# Patient Record
Sex: Female | Born: 1990 | Race: Black or African American | Hispanic: No | Marital: Single | Smoking: Former smoker
Health system: Southern US, Community
[De-identification: ages and names within clinical notes are randomized; demographics above are authoritative.]

## PROBLEM LIST (undated history)

## (undated) ENCOUNTER — Inpatient Hospital Stay (HOSPITAL_COMMUNITY): Payer: Self-pay

## (undated) DIAGNOSIS — I272 Pulmonary hypertension, unspecified: Secondary | ICD-10-CM

## (undated) DIAGNOSIS — M1990 Unspecified osteoarthritis, unspecified site: Secondary | ICD-10-CM

## (undated) DIAGNOSIS — IMO0002 Reserved for concepts with insufficient information to code with codable children: Secondary | ICD-10-CM

## (undated) DIAGNOSIS — R519 Headache, unspecified: Secondary | ICD-10-CM

## (undated) DIAGNOSIS — K219 Gastro-esophageal reflux disease without esophagitis: Secondary | ICD-10-CM

## (undated) DIAGNOSIS — M329 Systemic lupus erythematosus, unspecified: Secondary | ICD-10-CM

## (undated) DIAGNOSIS — J984 Other disorders of lung: Secondary | ICD-10-CM

## (undated) DIAGNOSIS — J188 Other pneumonia, unspecified organism: Secondary | ICD-10-CM

## (undated) DIAGNOSIS — I10 Essential (primary) hypertension: Secondary | ICD-10-CM

## (undated) DIAGNOSIS — J9601 Acute respiratory failure with hypoxia: Secondary | ICD-10-CM

## (undated) DIAGNOSIS — A64 Unspecified sexually transmitted disease: Secondary | ICD-10-CM

## (undated) DIAGNOSIS — R87629 Unspecified abnormal cytological findings in specimens from vagina: Secondary | ICD-10-CM

## (undated) DIAGNOSIS — M3500 Sicca syndrome, unspecified: Secondary | ICD-10-CM

## (undated) DIAGNOSIS — R51 Headache: Secondary | ICD-10-CM

## (undated) DIAGNOSIS — J849 Interstitial pulmonary disease, unspecified: Secondary | ICD-10-CM

## (undated) DIAGNOSIS — J189 Pneumonia, unspecified organism: Secondary | ICD-10-CM

## (undated) DIAGNOSIS — F32A Depression, unspecified: Secondary | ICD-10-CM

## (undated) HISTORY — DX: Pneumonia, unspecified organism: J18.9

## (undated) HISTORY — DX: Unspecified sexually transmitted disease: A64

## (undated) HISTORY — DX: Essential (primary) hypertension: I10

## (undated) HISTORY — DX: Pulmonary hypertension, unspecified: I27.20

## (undated) HISTORY — DX: Interstitial pulmonary disease, unspecified: J84.9

## (undated) HISTORY — DX: Other pneumonia, unspecified organism: J18.8

## (undated) HISTORY — DX: Other disorders of lung: J98.4

## (undated) HISTORY — DX: Acute respiratory failure with hypoxia: J96.01

---
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
## 2014-03-17 ENCOUNTER — Encounter (HOSPITAL_COMMUNITY): Payer: Self-pay | Admitting: Emergency Medicine

## 2014-03-17 ENCOUNTER — Emergency Department (HOSPITAL_COMMUNITY)
Admission: EM | Admit: 2014-03-17 | Discharge: 2014-03-17 | Disposition: A | Discharge status: Home / Self Care | Payer: Self-pay | Attending: Emergency Medicine | Admitting: Emergency Medicine

## 2014-03-17 DIAGNOSIS — N760 Acute vaginitis: Secondary | ICD-10-CM | POA: Insufficient documentation

## 2014-03-17 DIAGNOSIS — M545 Low back pain, unspecified: Secondary | ICD-10-CM | POA: Insufficient documentation

## 2014-03-17 DIAGNOSIS — M549 Dorsalgia, unspecified: Secondary | ICD-10-CM

## 2014-03-17 DIAGNOSIS — F17200 Nicotine dependence, unspecified, uncomplicated: Secondary | ICD-10-CM | POA: Insufficient documentation

## 2014-03-17 DIAGNOSIS — G8921 Chronic pain due to trauma: Secondary | ICD-10-CM | POA: Insufficient documentation

## 2014-03-17 DIAGNOSIS — A499 Bacterial infection, unspecified: Secondary | ICD-10-CM | POA: Insufficient documentation

## 2014-03-17 DIAGNOSIS — B9689 Other specified bacterial agents as the cause of diseases classified elsewhere: Secondary | ICD-10-CM | POA: Insufficient documentation

## 2014-03-17 DIAGNOSIS — Z3202 Encounter for pregnancy test, result negative: Secondary | ICD-10-CM | POA: Insufficient documentation

## 2014-03-17 LAB — WET PREP, GENITAL
Trich, Wet Prep: NONE SEEN
Yeast Wet Prep HPF POC: NONE SEEN

## 2014-03-17 LAB — URINALYSIS, ROUTINE W REFLEX MICROSCOPIC
Bilirubin Urine: NEGATIVE
Glucose, UA: NEGATIVE mg/dL
Hgb urine dipstick: NEGATIVE
Ketones, ur: NEGATIVE mg/dL
Leukocytes, UA: NEGATIVE
Nitrite: NEGATIVE
Protein, ur: NEGATIVE mg/dL
Specific Gravity, Urine: 1.017 (ref 1.005–1.030)
Urobilinogen, UA: 0.2 mg/dL (ref 0.0–1.0)
pH: 7 (ref 5.0–8.0)

## 2014-03-17 LAB — HIV ANTIBODY (ROUTINE TESTING W REFLEX): HIV 1&2 Ab, 4th Generation: NONREACTIVE

## 2014-03-17 LAB — POC URINE PREG, ED: Preg Test, Ur: NEGATIVE

## 2014-03-17 MED ORDER — METRONIDAZOLE 500 MG PO TABS: 500.0000 mg | ORAL_TABLET | Freq: Two times a day (BID) | ORAL | Status: DC

## 2014-03-17 MED ORDER — TRAMADOL HCL 50 MG PO TABS: 50.0000 mg | ORAL_TABLET | Freq: Four times a day (QID) | ORAL | Status: DC | PRN

## 2014-03-17 MED ORDER — HYDROCODONE-ACETAMINOPHEN 5-325 MG PO TABS
2.0000 | ORAL_TABLET | Freq: Once | ORAL | Status: AC
  Administered 2014-03-17: 2 via ORAL
  Filled 2014-03-17: qty 2

## 2014-03-17 MED ORDER — ONDANSETRON 4 MG PO TBDP
8.0000 mg | ORAL_TABLET | Freq: Once | ORAL | Status: AC
  Administered 2014-03-17: 8 mg via ORAL
  Filled 2014-03-17: qty 2

## 2014-03-17 NOTE — Discharge Instructions (Signed)
Bacterial Vaginosis °Bacterial vaginosis is a vaginal infection that occurs when the normal balance of bacteria in the vagina is disrupted. It results from an overgrowth of certain bacteria. This is the most common vaginal infection in women of childbearing age. Treatment is important to prevent complications, especially in pregnant women, as it can cause a premature delivery. °CAUSES  °Bacterial vaginosis is caused by an increase in harmful bacteria that are normally present in smaller amounts in the vagina. Several different kinds of bacteria can cause bacterial vaginosis. However, the reason that the condition develops is not fully understood. °RISK FACTORS °Certain activities or behaviors can put you at an increased risk of developing bacterial vaginosis, including: °· Having a new sex partner or multiple sex partners. °· Douching. °· Using an intrauterine device (IUD) for contraception. °Women do not get bacterial vaginosis from toilet seats, bedding, swimming pools, or contact with objects around them. °SIGNS AND SYMPTOMS  °Some women with bacterial vaginosis have no signs or symptoms. Common symptoms include: °· Grey vaginal discharge. °· A fishlike odor with discharge, especially after sexual intercourse. °· Itching or burning of the vagina and vulva. °· Burning or pain with urination. °DIAGNOSIS  °Your health care provider will take a medical history and examine the vagina for signs of bacterial vaginosis. A sample of vaginal fluid may be taken. Your health care provider will look at this sample under a microscope to check for bacteria and abnormal cells. A vaginal pH test may also be done.  °TREATMENT  °Bacterial vaginosis may be treated with antibiotic medicines. These may be given in the form of a pill or a vaginal cream. A second round of antibiotics may be prescribed if the condition comes back after treatment.  °HOME CARE INSTRUCTIONS  °· Only take over-the-counter or prescription medicines as  directed by your health care provider. °· If antibiotic medicine was prescribed, take it as directed. Make sure you finish it even if you start to feel better. °· Do not have sex until treatment is completed. °· Tell all sexual partners that you have a vaginal infection. They should see their health care provider and be treated if they have problems, such as a mild rash or itching. °· Practice safe sex by using condoms and only having one sex partner. °SEEK MEDICAL CARE IF:  °· Your symptoms are not improving after 3 days of treatment. °· You have increased discharge or pain. °· You have a fever. °MAKE SURE YOU:  °· Understand these instructions. °· Will watch your condition. °· Will get help right away if you are not doing well or get worse. °FOR MORE INFORMATION  °Centers for Disease Control and Prevention, Division of STD Prevention: www.cdc.gov/std °American Sexual Health Association (ASHA): www.ashastd.org  °Document Released: 11/07/2005 Document Revised: 08/28/2013 Document Reviewed: 06/19/2013 °ExitCare® Patient Information ©2014 [employer]. ° ° °Back Pain, Adult °Low back pain is very common. About 1 in 5 people have back pain. The cause of low back pain is rarely dangerous. The pain often gets better over time. About half of people with a sudden onset of back pain feel better in just 2 weeks. About 8 in 10 people feel better by 6 weeks.  °CAUSES °Some common causes of back pain include: °· Strain of the muscles or ligaments supporting the spine. °· Wear and tear (degeneration) of the spinal discs. °· Arthritis. °· Direct injury to the back. °DIAGNOSIS °Most of the time, the direct cause of low back pain is not known. However, back pain can   be treated effectively even when the exact cause of the pain is unknown. Answering your caregiver's questions about your overall health and symptoms is one of the most accurate ways to make sure the cause of your pain is not dangerous. If your caregiver needs more  information, he or she may order lab work or imaging tests (X-rays or MRIs). However, even if imaging tests show changes in your back, this usually does not require surgery. °HOME CARE INSTRUCTIONS °For many people, back pain returns. Since low back pain is rarely dangerous, it is often a condition that people can learn to manage on their own.  °· Remain active. It is stressful on the back to sit or stand in one place. Do not sit, drive, or stand in one place for more than 30 minutes at a time. Take short walks on level surfaces as soon as pain allows. Try to increase the length of time you walk each day. °· Do not stay in bed. Resting more than 1 or 2 days can delay your recovery. °· Do not avoid exercise or work. Your body is made to move. It is not dangerous to be active, even though your back may hurt. Your back will likely heal faster if you return to being active before your pain is gone. °· Pay attention to your body when you  bend and lift. Many people have less discomfort when lifting if they bend their knees, keep the load close to their bodies, and avoid twisting. Often, the most comfortable positions are those that put less stress on your recovering back. °· Find a comfortable position to sleep. Use a firm mattress and lie on your side with your knees slightly bent. If you lie on your back, put a pillow under your knees. °· Only take over-the-counter or prescription medicines as directed by your caregiver. Over-the-counter medicines to reduce pain and inflammation are often the most helpful. Your caregiver may prescribe muscle relaxant drugs. These medicines help dull your pain so you can more quickly return to your normal activities and healthy exercise. °· Put ice on the injured area. °· Put ice in a plastic bag. °· Place a towel between your skin and the bag. °· Leave the ice on for 15-20 minutes, 03-04 times a day for the first 2 to 3 days. After that, ice and heat may be alternated to reduce pain  and spasms. °· Ask your caregiver about trying back exercises and gentle massage. This may be of some benefit. °· Avoid feeling anxious or stressed. Stress increases muscle tension and can worsen back pain. It is important to recognize when you are anxious or stressed and learn ways to manage it. Exercise is a great option. °SEEK MEDICAL CARE IF: °· You have pain that is not relieved with rest or medicine. °· You have pain that does not improve in 1 week. °· You have new symptoms. °· You are generally not feeling well. °SEEK IMMEDIATE MEDICAL CARE IF:  °· You have pain that radiates from your back into your legs. °· You develop new bowel or bladder control problems. °· You have unusual weakness or numbness in your arms or legs. °· You develop nausea or vomiting. °· You develop abdominal pain. °· You feel faint. °Document Released: 11/07/2005 Document Revised: 05/08/2012 Document Reviewed: 03/28/2011 °ExitCare® Patient Information ©2014 [employer]. ° °
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
## 2014-03-17 NOTE — ED Notes (Addendum)
Pt presents with lower back pain, clear vaginal discharge, and moderate vaginal itching x10 days. Pt denies burning with urination.

## 2014-03-18 LAB — GC/CHLAMYDIA PROBE AMP
CT Probe RNA: POSITIVE — AB
GC Probe RNA: NEGATIVE

## 2014-03-19 NOTE — ED Provider Notes (Signed)
Medical screening examination/treatment/procedure(s) were performed by non-physician practitioner and as supervising physician I was immediately available for consultation/collaboration.   EKG Interpretation None        Laray AngerKathleen M Marcele Kosta, DO 03/19/14 1627

## 2014-03-20 ENCOUNTER — Telehealth (HOSPITAL_BASED_OUTPATIENT_CLINIC_OR_DEPARTMENT_OTHER): Payer: Self-pay | Admitting: Emergency Medicine

## 2014-03-20 NOTE — Telephone Encounter (Signed)
+  Chlamydia. Chart sent to EDP office for review. [facility]HS attached. 

## 2014-03-21 HISTORY — PX: FINGER SURGERY: SHX640

## 2014-03-30 ENCOUNTER — Encounter (HOSPITAL_COMMUNITY): Payer: Self-pay | Admitting: Emergency Medicine

## 2014-03-30 ENCOUNTER — Emergency Department (HOSPITAL_COMMUNITY)
Admission: EM | Admit: 2014-03-30 | Discharge: 2014-03-30 | Disposition: A | Discharge status: Home / Self Care | Payer: Worker's Compensation | Attending: Emergency Medicine | Admitting: Emergency Medicine

## 2014-03-30 DIAGNOSIS — S61209A Unspecified open wound of unspecified finger without damage to nail, initial encounter: Secondary | ICD-10-CM | POA: Insufficient documentation

## 2014-03-30 DIAGNOSIS — W3189XA Contact with other specified machinery, initial encounter: Secondary | ICD-10-CM | POA: Insufficient documentation

## 2014-03-30 DIAGNOSIS — Y9289 Other specified places as the place of occurrence of the external cause: Secondary | ICD-10-CM | POA: Insufficient documentation

## 2014-03-30 DIAGNOSIS — Z792 Long term (current) use of antibiotics: Secondary | ICD-10-CM | POA: Insufficient documentation

## 2014-03-30 DIAGNOSIS — Y9389 Activity, other specified: Secondary | ICD-10-CM | POA: Insufficient documentation

## 2014-03-30 DIAGNOSIS — F17200 Nicotine dependence, unspecified, uncomplicated: Secondary | ICD-10-CM | POA: Insufficient documentation

## 2014-03-30 DIAGNOSIS — S61210A Laceration without foreign body of right index finger without damage to nail, initial encounter: Secondary | ICD-10-CM

## 2014-03-30 DIAGNOSIS — Y990 Civilian activity done for income or pay: Secondary | ICD-10-CM | POA: Insufficient documentation

## 2014-03-30 MED ORDER — HYDROCODONE-ACETAMINOPHEN 5-325 MG PO TABS: 1.0000 | ORAL_TABLET | Freq: Four times a day (QID) | ORAL | Status: DC | PRN

## 2014-03-30 MED ORDER — HYDROCODONE-ACETAMINOPHEN 5-325 MG PO TABS
1.0000 | ORAL_TABLET | Freq: Once | ORAL | Status: AC
  Administered 2014-03-30: 1 via ORAL
  Filled 2014-03-30: qty 1

## 2014-03-30 NOTE — ED Provider Notes (Signed)
CSN: [id number]     Arrival date & time 03/30/14  0044 History   First MD Initiated Contact with Patient 03/30/14 0047     No chief complaint on file.    (Consider location/radiation/quality/duration/timing/severity/associated sxs/prior Treatment) HPI  23 year old female presents for evaluation of finger laceration.  Pt report she was working at [employer] tonight and while cleaning out the [employer] machine, the machine turn on and a part of the machine cuts her R index finger.  Incident happened an hr ago.  Report 10/10 sharp throbbing pain to R index finger with active bleeding.  Denies numbness.  Denies any other injury.  Is UTD with tetanus.    No past medical history on file. No past surgical history on file. No family history on file. History  Substance Use Topics  . Smoking status: Current Every Day Smoker  . Smokeless tobacco: Not on file  . Alcohol Use: Yes   OB History   Grav Para Term Preterm Abortions TAB SAB Ect Mult Living                 Review of Systems  Constitutional: Negative for fever.  Skin: Positive for wound.  Neurological: Negative for numbness.      Allergies  Review of patient's allergies indicates no known allergies.  Home Medications   Prior to Admission medications   Medication Sig Start Date End Date Taking? Authorizing Provider  metroNIDAZOLE (FLAGYL) 500 MG tablet Take 1 tablet (500 mg total) by mouth 2 (two) times daily. One po bid x 7 days 03/17/14   Mora BellmanHannah S Merrell, PA-C  traMADol (ULTRAM) 50 MG tablet Take 1 tablet (50 mg total) by mouth every 6 (six) hours as needed. 03/17/14   Mora BellmanHannah S Merrell, PA-C   There were no vitals taken for this visit. Physical Exam  Nursing note and vitals reviewed. Constitutional: She appears well-developed and well-nourished. No distress.  HENT:  Head: Atraumatic.  Eyes: Conjunctivae are normal.  Neck: Neck supple.  Musculoskeletal: She exhibits tenderness (R hand: R index finger with 2cm v-shaped  superficial laceration to lateral aspect, no joint involvement.  NVI.  able to flex /extend finger at each joint.  brisk cap refill, sensation intact. no nerve/bony/tendon involvement). She exhibits no edema.  Neurological: She is alert.  Skin: No rash noted.  Psychiatric: She has a normal mood and affect.    ED Course  Procedures (including critical care time)  12:58 AM Pt injured her R index finger while at work.  Has a superficial lac to R index finger.  No joint, bony, nerve or tendon involvement.  Will cleansed wound and perform laceration repair.  Pain medication provided.  Pt is UTD with tetanus.  Finger splint provide for stability and support.  Pt made aware to return in 1 week for sutures removal.    LACERATION REPAIR Performed by: Fayrene HelperBowie Shashana Fullington Authorized by: Fayrene HelperBowie Marzell Isakson Consent: Verbal consent obtained. Risks and benefits: risks, benefits and alternatives were discussed Consent given by: patient Patient identity confirmed: provided demographic data Prepped and Draped in normal sterile fashion Wound explored  Laceration Location: R index finger  Laceration Length: 2cm  No Foreign Bodies seen or palpated  Anesthesia: digital nerve block  Local anesthetic: lidocaine 2% w/o epinephrine  Anesthetic total: 4 ml  Irrigation method: syringe Amount of cleaning: standard  Skin closure: prolene 5.0  Number of sutures: 7  Technique: simple interrupted  Patient tolerance: Patient tolerated the procedure well with no immediate complications.   Labs  Review Labs Reviewed - No data to display  Imaging Review No results found.   EKG Interpretation None      MDM   Final diagnoses:  Laceration of right index finger w/o foreign body w/o damage to nail    BP 135/85  Pulse 82  Temp(Src) 98.5 F (36.9 C) (Oral)  Resp 18  SpO2 98%  LMP 03/27/2014      Fayrene HelperBowie Starlee Corralejo, PA-C 03/30/14 0136

## 2014-03-30 NOTE — ED Provider Notes (Signed)
Medical screening examination/treatment/procedure(s) were performed by non-physician practitioner and as supervising physician I was immediately available for consultation/collaboration.   Tyrek Lawhorn, MD 03/30/14 0653 

## 2014-03-30 NOTE — ED Notes (Signed)
Patient at work this evening and got right pointer finger jammed into machine, now with open wound on middle knuckle.  Bleeding controlled.

## 2014-03-30 NOTE — Discharge Instructions (Signed)
Please have your sutures removed in 1 week.  Return if you notice signs of infection including increase swelling, redness, pus drainage.  Take pain medication as needed.    Laceration Care, Adult A laceration is a cut or lesion that goes through all layers of the skin and into the tissue just beneath the skin. TREATMENT  Some lacerations may not require closure. Some lacerations may not be able to be closed due to an increased risk of infection. It is important to see your caregiver as soon as possible after an injury to minimize the risk of infection and maximize the opportunity for successful closure. If closure is appropriate, pain medicines may be given, if needed. The wound will be cleaned to help prevent infection. Your caregiver will use stitches (sutures), staples, wound glue (adhesive), or skin adhesive strips to repair the laceration. These tools bring the skin edges together to allow for faster healing and a better cosmetic outcome. However, all wounds will heal with a scar. Once the wound has healed, scarring can be minimized by covering the wound with sunscreen during the day for 1 full year. HOME CARE INSTRUCTIONS  For sutures or staples:  Keep the wound clean and dry.  If you were given a bandage (dressing), you should change it at least once a day. Also, change the dressing if it becomes wet or dirty, or as directed by your caregiver.  Wash the wound with soap and water 2 times a day. Rinse the wound off with water to remove all soap. Pat the wound dry with a clean towel.  After cleaning, apply a thin layer of the antibiotic ointment as recommended by your caregiver. This will help prevent infection and keep the dressing from sticking.  You may shower as usual after the first 24 hours. Do not soak the wound in water until the sutures are removed.  Only take over-the-counter or prescription medicines for pain, discomfort, or fever as directed by your caregiver.  Get your sutures  or staples removed as directed by your caregiver. For skin adhesive strips:  Keep the wound clean and dry.  Do not get the skin adhesive strips wet. You may bathe carefully, using caution to keep the wound dry.  If the wound gets wet, pat it dry with a clean towel.  Skin adhesive strips will fall off on their own. You may trim the strips as the wound heals. Do not remove skin adhesive strips that are still stuck to the wound. They will fall off in time. For wound adhesive:  You may briefly wet your wound in the shower or bath. Do not soak or scrub the wound. Do not swim. Avoid periods of heavy perspiration until the skin adhesive has fallen off on its own. After showering or bathing, gently pat the wound dry with a clean towel.  Do not apply liquid medicine, cream medicine, or ointment medicine to your wound while the skin adhesive is in place. This may loosen the film before your wound is healed.  If a dressing is placed over the wound, be careful not to apply tape directly over the skin adhesive. This may cause the adhesive to be pulled off before the wound is healed.  Avoid prolonged exposure to sunlight or tanning lamps while the skin adhesive is in place. Exposure to ultraviolet light in the first year will darken the scar.  The skin adhesive will usually remain in place for 5 to 10 days, then naturally fall off the skin. Do  not pick at the adhesive film. You may need a tetanus shot if:  You cannot remember when you had your last tetanus shot.  You have never had a tetanus shot. If you get a tetanus shot, your arm may swell, get red, and feel warm to the touch. This is common and not a problem. If you need a tetanus shot and you choose not to have one, there is a rare chance of getting tetanus. Sickness from tetanus can be serious. SEEK MEDICAL CARE IF:   You have redness, swelling, or increasing pain in the wound.  You see a red line that goes away from the wound.  You have  yellowish-white fluid (pus) coming from the wound.  You have a fever.  You notice a bad smell coming from the wound or dressing.  Your wound breaks open before or after sutures have been removed.  You notice something coming out of the wound such as wood or glass.  Your wound is on your hand or foot and you cannot move a finger or toe. SEEK IMMEDIATE MEDICAL CARE IF:   Your pain is not controlled with prescribed medicine.  You have severe swelling around the wound causing pain and numbness or a change in color in your arm, hand, leg, or foot.  Your wound splits open and starts bleeding.  You have worsening numbness, weakness, or loss of function of any joint around or beyond the wound.  You develop painful lumps near the wound or on the skin anywhere on your body. MAKE SURE YOU:   Understand these instructions.  Will watch your condition.  Will get help right away if you are not doing well or get worse. Document Released: 11/07/2005 Document Revised: 01/30/2012 Document Reviewed: 05/03/2011 [facility] Patient Information 2014 [employer], [place].

## 2014-03-30 NOTE — Progress Notes (Signed)
Orthopedic Tech Progress Note Patient Details:  Lelan PonsLashonna Marques [birth year] [id number]  Ortho Devices Type of Ortho Device: Finger splint   Haskell FlirtCorey M Jonmarc Bodkin 03/30/2014, 1:38 AM

## 2014-09-18 ENCOUNTER — Encounter (HOSPITAL_COMMUNITY): Payer: Self-pay | Admitting: Emergency Medicine

## 2014-09-18 ENCOUNTER — Emergency Department (HOSPITAL_COMMUNITY)
Admission: EM | Admit: 2014-09-18 | Discharge: 2014-09-19 | Disposition: A | Discharge status: Home / Self Care | Payer: Self-pay | Attending: Emergency Medicine | Admitting: Emergency Medicine

## 2014-09-18 ENCOUNTER — Emergency Department (HOSPITAL_COMMUNITY): Payer: Self-pay

## 2014-09-18 DIAGNOSIS — R21 Rash and other nonspecific skin eruption: Secondary | ICD-10-CM

## 2014-09-18 DIAGNOSIS — L309 Dermatitis, unspecified: Secondary | ICD-10-CM | POA: Insufficient documentation

## 2014-09-18 DIAGNOSIS — R058 Other specified cough: Secondary | ICD-10-CM

## 2014-09-18 DIAGNOSIS — J189 Pneumonia, unspecified organism: Secondary | ICD-10-CM

## 2014-09-18 DIAGNOSIS — J159 Unspecified bacterial pneumonia: Secondary | ICD-10-CM | POA: Insufficient documentation

## 2014-09-18 DIAGNOSIS — B079 Viral wart, unspecified: Secondary | ICD-10-CM | POA: Insufficient documentation

## 2014-09-18 DIAGNOSIS — R05 Cough: Secondary | ICD-10-CM

## 2014-09-18 DIAGNOSIS — Z872 Personal history of diseases of the skin and subcutaneous tissue: Secondary | ICD-10-CM

## 2014-09-18 DIAGNOSIS — Z720 Tobacco use: Secondary | ICD-10-CM | POA: Insufficient documentation

## 2014-09-18 DIAGNOSIS — Z79899 Other long term (current) drug therapy: Secondary | ICD-10-CM | POA: Insufficient documentation

## 2014-09-18 MED ORDER — BENZONATATE 100 MG PO CAPS: 100.0000 mg | ORAL_CAPSULE | Freq: Three times a day (TID) | ORAL | Status: DC

## 2014-09-18 MED ORDER — AZITHROMYCIN 250 MG PO TABS: 250.0000 mg | ORAL_TABLET | Freq: Every day | ORAL | Status: DC

## 2014-09-18 NOTE — ED Provider Notes (Signed)
CSN: [id number]     Arrival date & time 09/18/14  1740 History   First MD Initiated Contact with Patient 09/18/14 2133     Chief Complaint  Patient presents with  . Rash  . URI     (Consider location/radiation/quality/duration/timing/severity/associated sxs/prior Treatment) HPI Kayla [name] is a 23 y.o. female with PMH of ezcema presenting with with 5 days of productive cough of intermittent thick colored sputum with small streaks of blood yesterday morning. Patient also with sinus congestion, sore throat, generalized aches and pains. Pt denies taking anything for the symptoms. Patient also with generalized rash that started on right chest and spread to bilateral face over cheeks and nose, left buttocks and left thigh and bilateral hands. Rash is pruritic. Patient used hydrocortisone without relief. Denies history of asthma but patient is a smoker.    History reviewed. No pertinent past medical history. History reviewed. No pertinent past surgical history. History reviewed. No pertinent family history. History  Substance Use Topics  . Smoking status: Current Every Day Smoker  . Smokeless tobacco: Not on file  . Alcohol Use: Yes   OB History   Grav Para Term Preterm Abortions TAB SAB Ect Mult Living                 Review of Systems  Constitutional: Positive for fever and chills.  HENT: Positive for congestion and rhinorrhea.   Eyes: Negative for visual disturbance.  Respiratory: Positive for cough. Negative for shortness of breath.   Cardiovascular: Negative for chest pain and palpitations.  Gastrointestinal: Negative for nausea, vomiting and diarrhea.  Genitourinary: Negative for dysuria and hematuria.  Musculoskeletal: Negative for back pain and gait problem.  Skin: Positive for rash.  Neurological: Negative for weakness and headaches.      Allergies  Review of patient's allergies indicates no known allergies.  Home Medications   Prior to Admission medications    Medication Sig Start Date End Date Taking? Authorizing Provider  gabapentin (NEURONTIN) 300 MG capsule Take 300 mg by mouth 3 (three) times daily.   Yes Historical Provider, MD  azithromycin (ZITHROMAX) 250 MG tablet Take 1 tablet (250 mg total) by mouth daily. Take first 2 tablets together, then 1 every day until finished. 09/18/14   Louann SjogrenVictoria L Avnoor Koury, PA-C  benzonatate (TESSALON) 100 MG capsule Take 1 capsule (100 mg total) by mouth every 8 (eight) hours. 09/18/14   Benetta SparVictoria L Birtie Fellman, PA-C   BP 118/72  Pulse 83  Temp(Src) 99.7 F (37.6 C) (Oral)  Resp 18  SpO2 100% Physical Exam  Nursing note and vitals reviewed. Constitutional: She appears well-developed and well-nourished. No distress.  HENT:  Head: Normocephalic and atraumatic.  Nose: Right sinus exhibits no maxillary sinus tenderness and no frontal sinus tenderness. Left sinus exhibits no maxillary sinus tenderness and no frontal sinus tenderness.  Mouth/Throat: Mucous membranes are normal. Posterior oropharyngeal edema and posterior oropharyngeal erythema present. No oropharyngeal exudate.  Eyes: Conjunctivae and EOM are normal. Right eye exhibits no discharge. Left eye exhibits no discharge.  Neck: Normal range of motion. Neck supple.  Cardiovascular: Normal rate, regular rhythm and normal heart sounds.   Pulmonary/Chest: Effort normal and breath sounds normal. No respiratory distress. She has no wheezes. She has no rales.  Abdominal: Soft. Bowel sounds are normal. She exhibits no distension. There is no tenderness.  Lymphadenopathy:    She has no cervical adenopathy.  Neurological: She is alert.  Skin: Skin is warm and dry. She is not diaphoretic.  Patient  with lesions consistent with viral warts on her bilateral hands. Pt with erythematous confluent rash over cheeks and bridge of nose. Similar rash on right upper chest. Patient with excoriations to left buttocks with mild erythema and right thigh.    ED Course  Procedures  (including critical care time) Labs Review Labs Reviewed - No data to display  Imaging Review Dg Chest 2 View  09/18/2014   CLINICAL DATA:  Productive cough, chest pain, shortness of breath.  EXAM: CHEST  2 VIEW  COMPARISON:  None.  FINDINGS: Multifocal airspace opacities, most pronounced within the bases, left greater than right. Cardiomediastinal contours within normal range. No pleural effusion or pneumothorax. No acute osseous finding.  IMPRESSION: Multifocal pneumonia.  Recommend radiograph follow-up after therapy to document resolution.   Electronically Signed   By: Jearld LeschAndrew  DelGaizo M.D.   On: 09/18/2014 23:19     EKG Interpretation None     Meds given in ED:  Medications - No data to display  New Prescriptions   AZITHROMYCIN (ZITHROMAX) 250 MG TABLET    Take 1 tablet (250 mg total) by mouth daily. Take first 2 tablets together, then 1 every day until finished.   BENZONATATE (TESSALON) 100 MG CAPSULE    Take 1 capsule (100 mg total) by mouth every 8 (eight) hours.      MDM   Final diagnoses:  Productive cough  CAP (community acquired pneumonia)  Viral warts  Rash  H/O eczema   Patient has been diagnosed with CAP via chest xray. Pt is not ill appearing, immunocompromised, and does not have multiple co morbidities, therefore I feel like the they can be treated as an OP with abx therapy. Pt has been advised to return to the ED if symptoms worsen or they do not improve. Pt without PCP and to follow up with wellness center. Pt also with history of eczema presenting with rash to face, right chest, left buttocks and left thigh with excoriations. I suspect eczema. tx with moisturizing lotions. Pt also with viral warts on bilateral hands. Pt to tx with OTC salicylic acid or cryotherapy. Pt verbalizes understanding and is agreeable with plan.   Discussed return precautions with patient. Discussed all results and patient verbalizes understanding and agrees with plan.  Case has been  discussed with Dr. Deretha EmoryZackowski who agrees with the above plan and to discharge.     Louann SjogrenVictoria L Azka Steger, PA-C 09/19/14 [id number]

## 2014-09-18 NOTE — Discharge Instructions (Signed)
Return to the emergency room with worsening of symptoms, new symptoms or with symptoms that are concerning, especially you start coughing up blood, pain is uncontrolled, severe shortness of breath or chest pain, you are getting worse instead of betterh. Follow up with the wellness clinic in one week. Call to make appointment. Number above. Please take all of your antibiotics until finished!   You may develop abdominal discomfort or diarrhea from the antibiotic.  You may help offset this with probiotics which you can buy or get in yogurt. Do not eat  or take the probiotics until 2 hours after your antibiotic.  Use lubricating/moisturizing lotions for rash. DO NO SCRATCH. Use OTC freeze away for warts on hands or salicylic acid.  Drink plenty of fluids with electrolytes especially Gatorade. OTC cold medications such as mucinex, nyquil, dayquil are recommended. Chloraseptic for sore throat.

## 2014-09-18 NOTE — ED Notes (Signed)
A little achey but no problems

## 2014-09-18 NOTE — ED Notes (Signed)
Pt in c/o cough and congestion, also generalized rash that was noted this week and has spread, generalized fatigue also, no distress noted

## 2014-09-18 NOTE — ED Notes (Signed)
Pt. C/o rash to neck last weeks, states she used cortisone cream and it went away to neck but now has it to body. Rash appears dry and scabby. Pt. Also reports generalized fatigue that started at same time as rash, denies fever but reports "heat coming off my skin".

## 2014-09-19 NOTE — ED Provider Notes (Signed)
Medical screening examination/treatment/procedure(s) were performed by non-physician practitioner and as supervising physician I was immediately available for consultation/collaboration.   EKG Interpretation None       Vanetta MuldersScott Azarie Coriz, MD 09/19/14 [phone]

## 2014-11-05 ENCOUNTER — Encounter (HOSPITAL_COMMUNITY): Payer: Self-pay | Admitting: Family Medicine

## 2014-11-05 ENCOUNTER — Emergency Department (HOSPITAL_COMMUNITY)
Admission: EM | Admit: 2014-11-05 | Discharge: 2014-11-05 | Disposition: A | Discharge status: Home / Self Care | Payer: Self-pay | Attending: Emergency Medicine | Admitting: Emergency Medicine

## 2014-11-05 DIAGNOSIS — B9689 Other specified bacterial agents as the cause of diseases classified elsewhere: Secondary | ICD-10-CM

## 2014-11-05 DIAGNOSIS — Z79899 Other long term (current) drug therapy: Secondary | ICD-10-CM | POA: Insufficient documentation

## 2014-11-05 DIAGNOSIS — Z3202 Encounter for pregnancy test, result negative: Secondary | ICD-10-CM | POA: Insufficient documentation

## 2014-11-05 DIAGNOSIS — N760 Acute vaginitis: Secondary | ICD-10-CM | POA: Insufficient documentation

## 2014-11-05 DIAGNOSIS — Z720 Tobacco use: Secondary | ICD-10-CM | POA: Insufficient documentation

## 2014-11-05 DIAGNOSIS — Z792 Long term (current) use of antibiotics: Secondary | ICD-10-CM | POA: Insufficient documentation

## 2014-11-05 LAB — URINALYSIS W MICROSCOPIC (NOT AT ARMC)
BILIRUBIN URINE: NEGATIVE
GLUCOSE, UA: NEGATIVE mg/dL
HGB URINE DIPSTICK: NEGATIVE
Ketones, ur: NEGATIVE mg/dL
Leukocytes, UA: NEGATIVE
Nitrite: NEGATIVE
PH: 6.5 (ref 5.0–8.0)
Protein, ur: NEGATIVE mg/dL
SPECIFIC GRAVITY, URINE: 1.023 (ref 1.005–1.030)
Urobilinogen, UA: 1 mg/dL (ref 0.0–1.0)
WBC UA: 0 WBC/hpf (ref <3)

## 2014-11-05 LAB — WET PREP, GENITAL
TRICH WET PREP: NONE SEEN
YEAST WET PREP: NONE SEEN

## 2014-11-05 LAB — PREGNANCY, URINE: Preg Test, Ur: NEGATIVE

## 2014-11-05 MED ORDER — METRONIDAZOLE 500 MG PO TABS: 500.0000 mg | ORAL_TABLET | Freq: Two times a day (BID) | ORAL | Status: DC

## 2014-11-05 MED ORDER — POLYETHYLENE GLYCOL 3350 17 GM/SCOOP PO POWD: 17.0000 g | Freq: Every day | ORAL | Status: DC

## 2014-11-05 NOTE — ED Provider Notes (Signed)
CSN: [id number]     Arrival date & time 11/05/14  0957 History   First MD Initiated Contact with Patient 11/05/14 1113     Chief Complaint  Patient presents with  . Vaginal Itching  . Constipation   (Consider location/radiation/quality/duration/timing/severity/associated sxs/prior Treatment) HPI Kayla [name] is a 23 yo female presenting with vaginal itching x 3 days.  Her LMP ended 7 days ago.  She reports 1 sexual partner for the last 2 years.  She denies any pain with urination and has not noticed any discharge but has noticed a foul odor. She also reports she has not had a bowel movement in 4 days but is still passing gas. She denies abd pain, nausea, or vomiting.    History reviewed. No pertinent past medical history. History reviewed. No pertinent past surgical history. History reviewed. No pertinent family history. History  Substance Use Topics  . Smoking status: Current Every Day Smoker  . Smokeless tobacco: Not on file  . Alcohol Use: Yes   OB History    No data available     Review of Systems  Constitutional: Negative for fever and chills.  Respiratory: Negative for shortness of breath.   Cardiovascular: Negative for chest pain.  Gastrointestinal: Positive for constipation. Negative for nausea, vomiting and diarrhea.  Genitourinary: Positive for vaginal pain ( Itching). Negative for dysuria and vaginal bleeding.  Skin: Negative for rash.  Neurological: Negative for headaches.    Allergies  Review of patient's allergies indicates no known allergies.  Home Medications   Prior to Admission medications   Medication Sig Start Date End Date Taking? Authorizing Provider  gabapentin (NEURONTIN) 300 MG capsule Take 300 mg by mouth 3 (three) times daily.   Yes Historical Provider, MD  azithromycin (ZITHROMAX) 250 MG tablet Take 1 tablet (250 mg total) by mouth daily. Take first 2 tablets together, then 1 every day until finished. Patient not taking: Reported on  11/05/2014 09/18/14   Louann SjogrenVictoria L Creech, PA-C  benzonatate (TESSALON) 100 MG capsule Take 1 capsule (100 mg total) by mouth every 8 (eight) hours. Patient not taking: Reported on 11/05/2014 09/18/14   Louann SjogrenVictoria L Creech, PA-C   BP 113/75 mmHg  Pulse 76  Temp(Src) 97.9 F (36.6 C) (Oral)  Resp 17  Ht 5\' 4"  (1.626 m)  Wt 173 lb (78.472 kg)  BMI 29.68 kg/m2  SpO2 95%  LMP 10/31/2014 Physical Exam  Constitutional: She appears well-developed and well-nourished. No distress.  HENT:  Head: Normocephalic and atraumatic.  Eyes: Conjunctivae are normal.  Neck: Neck supple. No thyromegaly present.  Cardiovascular: Normal rate, regular rhythm and intact distal pulses.   Pulmonary/Chest: Effort normal and breath sounds normal. No respiratory distress.  Abdominal: Soft. There is no tenderness.  Genitourinary: There is no rash on the right labia. There is no rash on the left labia. Cervix exhibits no motion tenderness. Right adnexum displays no tenderness. Left adnexum displays no tenderness. Vaginal discharge found.  Thin, white discharge    Musculoskeletal: She exhibits no tenderness.  Lymphadenopathy:    She has no cervical adenopathy.  Neurological: She is alert.  Skin: Skin is warm and dry. No rash noted. She is not diaphoretic.  Psychiatric: She has a normal mood and affect.  Nursing note and vitals reviewed.   ED Course  Procedures (including critical care time) Labs Review Labs Reviewed  WET PREP, GENITAL - Abnormal; Notable for the following:    Clue Cells Wet Prep HPF POC TOO NUMEROUS TO COUNT (*)  WBC, Wet Prep HPF POC FEW (*)    All other components within normal limits  URINALYSIS W MICROSCOPIC - Abnormal; Notable for the following:    Bacteria, UA FEW (*)    Squamous Epithelial / LPF FEW (*)    All other components within normal limits  GC/CHLAMYDIA PROBE AMP  PREGNANCY, URINE   Imaging Review No results found.   EKG Interpretation None      MDM   Final  diagnoses:  Bacterial vaginosis   23 yo with vaginal itching, no dysuria and numerous clue cells on wet prep. Patient to be discharged with instructions [facility]. Pt not concerning for PID because hemodynamically stable and no cervical motion tenderness on pelvic exam. Pt has also been treated with flagyl for Bacterial Vaginosis. Pt has been advised to not drink alcohol while on this medication.  Pt is well-appearing, in no acute distress and vital signs are stable.  They appear safe to be discharged.  Discharge include follow-up with their PCP.  Return precautions provided.   Filed Vitals:   11/05/14 1002 11/05/14 1303  BP: 113/75 101/75  Pulse: 76 76  Temp: 97.9 F (36.6 C) 97.9 F (36.6 C)  TempSrc: Oral Oral  Resp: 17 18  Height: 5\' 4"  (1.626 m)   Weight: 173 lb (78.472 kg)   SpO2: 95% 95%   Meds given in ED:  Medications - No data to display  Discharge Medication List as of 11/05/2014 12:43 PM    START taking these medications   Details  metroNIDAZOLE (FLAGYL) 500 MG tablet Take 1 tablet (500 mg total) by mouth 2 (two) times daily., Starting 11/05/2014, Until Discontinued, Print    polyethylene glycol powder (GLYCOLAX/MIRALAX) powder Take 17 g by mouth daily. Until daily soft stools  OTC, Starting 11/05/2014, Until Discontinued, Print           Harle BattiestElizabeth Orbin Mayeux, NP 11/05/14 2129  Enid SkeensJoshua M Zavitz, MD 11/07/14 [phone]

## 2014-11-05 NOTE — ED Notes (Signed)
Patient is alert and orientedx4.  Patient was explained discharge instructions and they understood them with no questions.   

## 2014-11-05 NOTE — Discharge Instructions (Signed)
Please follow the directions provided.  Be [facility] to ensure your symptoms are improving. Take the Flagyl twice a day for 7 days to treat the infection.  Take the miralax daily as directed to help with the constipation.  Be sure to avoid drinking alcohol while taking the Flagyl as it will make you nauseated and vomit.  Don't hesitate to return for any new, worsening or concerning symptoms.      SEEK MEDICAL CARE IF:  Your symptoms are not improving after 3 days of treatment.  You have increased discharge or pain.  You have a fever.

## 2014-11-05 NOTE — ED Notes (Signed)
The patient says she is itching and has a foul odor but no pain.

## 2014-11-05 NOTE — ED Notes (Signed)
Per pt sts vaginal itching x 4 days. Denies discharge. sts urinary frequency.

## 2014-11-06 LAB — GC/CHLAMYDIA PROBE AMP
CT Probe RNA: NEGATIVE
GC PROBE AMP APTIMA: NEGATIVE

## 2014-12-18 ENCOUNTER — Encounter (HOSPITAL_COMMUNITY): Payer: Self-pay | Admitting: Emergency Medicine

## 2014-12-18 ENCOUNTER — Emergency Department (HOSPITAL_COMMUNITY)
Admission: EM | Admit: 2014-12-18 | Discharge: 2014-12-18 | Disposition: A | Discharge status: Home / Self Care | Payer: No Typology Code available for payment source | Attending: Emergency Medicine | Admitting: Emergency Medicine

## 2014-12-18 ENCOUNTER — Encounter (HOSPITAL_COMMUNITY): Payer: Self-pay | Admitting: *Deleted

## 2014-12-18 ENCOUNTER — Emergency Department (HOSPITAL_COMMUNITY)
Admission: EM | Admit: 2014-12-18 | Discharge: 2014-12-18 | Disposition: A | Discharge status: Nursing Home (Includes ICF) | Payer: No Typology Code available for payment source | Source: Home / Self Care | Attending: Family Medicine | Admitting: Family Medicine

## 2014-12-18 DIAGNOSIS — Z720 Tobacco use: Secondary | ICD-10-CM | POA: Insufficient documentation

## 2014-12-18 DIAGNOSIS — Z79899 Other long term (current) drug therapy: Secondary | ICD-10-CM | POA: Diagnosis not present

## 2014-12-18 DIAGNOSIS — R21 Rash and other nonspecific skin eruption: Secondary | ICD-10-CM

## 2014-12-18 LAB — CBC WITH DIFFERENTIAL/PLATELET
BASOS PCT: 0 % (ref 0–1)
Basophils Absolute: 0 10*3/uL (ref 0.0–0.1)
EOS ABS: 0 10*3/uL (ref 0.0–0.7)
Eosinophils Relative: 0 % (ref 0–5)
HCT: 38.1 % (ref 36.0–46.0)
Hemoglobin: 12.7 g/dL (ref 12.0–15.0)
Lymphocytes Relative: 30 % (ref 12–46)
Lymphs Abs: 0.7 10*3/uL (ref 0.7–4.0)
MCH: 26.6 pg (ref 26.0–34.0)
MCHC: 33.3 g/dL (ref 30.0–36.0)
MCV: 79.9 fL (ref 78.0–100.0)
Monocytes Absolute: 0.3 10*3/uL (ref 0.1–1.0)
Monocytes Relative: 12 % (ref 3–12)
NEUTROS ABS: 1.4 10*3/uL — AB (ref 1.7–7.7)
Neutrophils Relative %: 58 % (ref 43–77)
Platelets: 158 10*3/uL (ref 150–400)
RBC: 4.77 MIL/uL (ref 3.87–5.11)
RDW: 13.1 % (ref 11.5–15.5)
WBC: 2.4 10*3/uL — ABNORMAL LOW (ref 4.0–10.5)

## 2014-12-18 LAB — COMPREHENSIVE METABOLIC PANEL
ALBUMIN: 3.4 g/dL — AB (ref 3.5–5.2)
ALK PHOS: 96 U/L (ref 39–117)
ALT: 63 U/L — ABNORMAL HIGH (ref 0–35)
AST: 154 U/L — AB (ref 0–37)
Anion gap: 9 (ref 5–15)
BUN: <5 mg/dL — ABNORMAL LOW (ref 6–23)
CHLORIDE: 102 mmol/L (ref 96–112)
CO2: 27 mmol/L (ref 19–32)
CREATININE: 0.55 mg/dL (ref 0.50–1.10)
Calcium: 8.4 mg/dL (ref 8.4–10.5)
GFR calc non Af Amer: >90 mL/min (ref >90)
Glucose, Bld: 83 mg/dL (ref 70–99)
Potassium: 3.3 mmol/L — ABNORMAL LOW (ref 3.5–5.1)
Sodium: 138 mmol/L (ref 135–145)
Total Bilirubin: 1 mg/dL (ref 0.3–1.2)
Total Protein: 7.1 g/dL (ref 6.0–8.3)

## 2014-12-18 MED ORDER — PREDNISONE 20 MG PO TABS: 60.0000 mg | ORAL_TABLET | Freq: Every day | ORAL | Status: DC

## 2014-12-18 MED ORDER — PREDNISONE 20 MG PO TABS
60.0000 mg | ORAL_TABLET | Freq: Once | ORAL | Status: AC
  Administered 2014-12-18: 60 mg via ORAL
  Filled 2014-12-18: qty 3

## 2014-12-18 NOTE — ED Notes (Signed)
[facility] rash to generalized body that is painful

## 2014-12-18 NOTE — ED Provider Notes (Signed)
CSN: [id number]     Arrival date & time 12/18/14  1123 History   None    Chief Complaint  Patient presents with  . Rash   (Consider location/radiation/quality/duration/timing/severity/associated sxs/prior Treatment) HPI      24 year old female presents for evaluation of a rash. She started to have a small rash on her arms and legs 2 weeks ago. He got a lot worse in the past couple days. The rash and abdominal walls her lips, palms. She also has bad body aches and feels very ill. She also admits to slight sore throat. She denies fever, NVD. No recent travel or sick contacts. No recent new prescription medications, supplements, or over-the-counter medicines. No history of bad skin reactions  History reviewed. No pertinent past medical history. History reviewed. No pertinent past surgical history. No family history on file. History  Substance Use Topics  . Smoking status: Current Every Day Smoker  . Smokeless tobacco: Not on file  . Alcohol Use: Yes   OB History    No data available     Review of Systems  Musculoskeletal: Positive for myalgias and arthralgias.  Skin: Positive for rash.  All other systems reviewed and are negative.   Allergies  Review of patient's allergies indicates no known allergies.  Home Medications   Prior to Admission medications   Medication Sig Start Date End Date Taking? Authorizing Provider  azithromycin (ZITHROMAX) 250 MG tablet Take 1 tablet (250 mg total) by mouth daily. Take first 2 tablets together, then 1 every day until finished. Patient not taking: Reported on 11/05/2014 09/18/14   Louann SjogrenVictoria L Creech, PA-C  benzonatate (TESSALON) 100 MG capsule Take 1 capsule (100 mg total) by mouth every 8 (eight) hours. Patient not taking: Reported on 11/05/2014 09/18/14   Louann SjogrenVictoria L Creech, PA-C  gabapentin (NEURONTIN) 300 MG capsule Take 300 mg by mouth 3 (three) times daily.    Historical Provider, MD  metroNIDAZOLE (FLAGYL) 500 MG tablet Take 1 tablet (500  mg total) by mouth 2 (two) times daily. 11/05/14   Harle BattiestElizabeth Tysinger, NP  polyethylene glycol powder (GLYCOLAX/MIRALAX) powder Take 17 g by mouth daily. Until daily soft stools  OTC 11/05/14   Harle BattiestElizabeth Tysinger, NP   BP 113/74 mmHg  Pulse 90  Temp(Src) 99.4 F (37.4 C) (Oral)  Resp 14  SpO2 99%  LMP 11/23/2014 Physical Exam  Constitutional: She is oriented to person, place, and time. Vital signs are normal. She appears well-developed and well-nourished. She appears distressed (minimal).  HENT:  Head: Normocephalic and atraumatic.  Pulmonary/Chest: Effort normal. No respiratory distress.  Neurological: She is alert and oriented to person, place, and time. She has normal strength. Coordination normal.  Skin: Skin is warm and dry. Rash noted. She is not diaphoretic.  On the bilateral upper arms and abdomen, there is a rash that is macular with a few small vesicles, erythematous, scabbed/excoriated. There are erythematous maculopapular lesions on the bilateral palms. On her lips, there is desquamation. On her posterior soft palate, there is erythematous macular lesions. On her face in a malar distribution there is a raised erythematous rash  Psychiatric: She has a normal mood and affect. Judgment normal.  Nursing note and vitals reviewed.   ED Course  Procedures (including critical care time) Labs Review Labs Reviewed - No data to display  Imaging Review No results found.   MDM   1. 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 DDx also includes lupus, hand-foot-mouth. Transferred to ED for eval   Graylon Good, PA-C 12/18/14 1208

## 2014-12-18 NOTE — ED Notes (Addendum)
Pt  Has  A  Rash       On  Arms          And  Legs  And  abd  X   2  Weeks   -  Reports  As  Well  Generalized  Body  Aches     The  Rash    Itches         The  Pt    Was  On  gabepentin     For  An old  Arm  Injury          This was  Recently   Stopped  By  Her  Bone  Dr      She  Is  Sitting   uprigjht     On  Exam table     Speaking in  Complete  sentances      Has   Sores  In    Mouth     And  Lips appear  Slightly  Swollen     Airway is  Intact  Also  Has   Rash  On  Hands  As  Well

## 2014-12-18 NOTE — ED Notes (Signed)
Pt given crackers and ginger ale MD states okay to give.

## 2014-12-18 NOTE — Discharge Instructions (Signed)

## 2014-12-18 NOTE — ED Provider Notes (Signed)
CSN: [id number]     Arrival date & time 12/18/14  1212 History   First MD Initiated Contact with Patient 12/18/14 1254     Chief Complaint  Patient presents with  . Rash     (Consider location/radiation/quality/duration/timing/severity/associated sxs/prior Treatment) Patient is a 24 y.o. female presenting with rash. The history is provided by the patient.  Rash Associated symptoms: no abdominal pain, no diarrhea, no headaches, no nausea, no shortness of breath and not vomiting    patient was sent from urgent care with a rash. Began on her palms around a month ago. It itches. She states she has pain in multiple joints also. She's not had rashes or joint pain like this in the past. She states it'll also on her arms abdomen and face. She does have some lesions in her mouth also the patient states she does not know about. No cough. No recent change in medications. Sent in to rule out Stevens-Johnson syndrome or lupus.  History reviewed. No pertinent past medical history. History reviewed. No pertinent past surgical history. History reviewed. No pertinent family history. History  Substance Use Topics  . Smoking status: Current Every Day Smoker  . Smokeless tobacco: Not on file  . Alcohol Use: Yes   OB History    No data available     Review of Systems  Constitutional: Negative for activity change and appetite change.  Eyes: Negative for pain.  Respiratory: Negative for chest tightness and shortness of breath.   Cardiovascular: Negative for chest pain and leg swelling.  Gastrointestinal: Negative for nausea, vomiting, abdominal pain and diarrhea.  Genitourinary: Negative for flank pain.  Musculoskeletal: Positive for joint swelling. Negative for back pain and neck stiffness.  Skin: Positive for rash.  Neurological: Negative for weakness, numbness and headaches.  Psychiatric/Behavioral: Negative for behavioral problems.      Allergies  Review of patient's allergies indicates no  known allergies.  Home Medications   Prior to Admission medications   Medication Sig Start Date End Date Taking? Authorizing Provider  gabapentin (NEURONTIN) 300 MG capsule Take 300 mg by mouth 3 (three) times daily.   Yes Historical Provider, MD  ibuprofen (ADVIL,MOTRIN) 200 MG tablet Take 600 mg by mouth every 6 (six) hours as needed for mild pain.   Yes Historical Provider, MD  azithromycin (ZITHROMAX) 250 MG tablet Take 1 tablet (250 mg total) by mouth daily. Take first 2 tablets together, then 1 every day until finished. Patient not taking: Reported on 11/05/2014 09/18/14   Louann SjogrenVictoria L Creech, PA-C  benzonatate (TESSALON) 100 MG capsule Take 1 capsule (100 mg total) by mouth every 8 (eight) hours. Patient not taking: Reported on 11/05/2014 09/18/14   Louann SjogrenVictoria L Creech, PA-C  metroNIDAZOLE (FLAGYL) 500 MG tablet Take 1 tablet (500 mg total) by mouth 2 (two) times daily. Patient not taking: Reported on 12/18/2014 11/05/14   Harle BattiestElizabeth Tysinger, NP  polyethylene glycol powder (GLYCOLAX/MIRALAX) powder Take 17 g by mouth daily. Until daily soft stools  OTC Patient not taking: Reported on 12/18/2014 11/05/14   Harle BattiestElizabeth Tysinger, NP  predniSONE (DELTASONE) 20 MG tablet Take 3 tablets (60 mg total) by mouth daily. 12/18/14   Juliet RudeNathan R. Neiman Roots, MD   BP 125/67 mmHg  Pulse 105  Temp(Src) 98.2 F (36.8 C) (Oral)  Resp 18  Ht 5\' 4"  (1.626 m)  Wt 160 lb (72.576 kg)  BMI 27.45 kg/m2  SpO2 100%  LMP 11/23/2014 Physical Exam  Constitutional: She appears well-developed and well-nourished.  Not ill appearing  HENT:  Head: Normocephalic.  Cardiovascular: Normal rate.   Pulmonary/Chest: Effort normal.  Abdominal: There is no tenderness.  Musculoskeletal:  Mild pain over multiple joints.  Skin: Skin is warm.  Erythematous papules to bilateral hands. Also some somewhat crusting lesions to abdomen chest and right arm. Slight central erythema without sloughing of skin on middle of soft palate.  Some cracking of lips.    ED Course  Procedures (including critical care time) Labs Review Labs Reviewed  CBC WITH DIFFERENTIAL/PLATELET - Abnormal; Notable for the following:    WBC 2.4 (*)    Neutro Abs 1.4 (*)    All other components within normal limits  COMPREHENSIVE METABOLIC PANEL - Abnormal; Notable for the following:    Potassium 3.3 (*)    BUN <5 (*)    Albumin 3.4 (*)    AST 154 (*)    ALT 63 (*)    All other components within normal limits  RPR  HIV ANTIBODY (ROUTINE TESTING)    Imaging Review No results found.   EKG Interpretation None      MDM   Final diagnoses:  Rash and nonspecific skin eruption    Patient with rash. Does not appear to be Stevens-Johnson. RPR sent. Will treat with steroids and patient only follow up with PCP and possibly dermatology.    Juliet Rude. Rubin Payor, MD 12/18/14 [phone]

## 2014-12-19 LAB — HIV ANTIBODY (ROUTINE TESTING W REFLEX): HIV Screen 4th Generation wRfx: NONREACTIVE

## 2014-12-19 LAB — RPR: RPR Ser Ql: NONREACTIVE

## 2015-01-07 ENCOUNTER — Inpatient Hospital Stay (HOSPITAL_COMMUNITY)
Admission: EM | Admit: 2015-01-07 | Discharge: 2015-01-15 | DRG: 166 | Disposition: A | Discharge status: Home / Self Care | Payer: No Typology Code available for payment source | Attending: Internal Medicine | Admitting: Internal Medicine

## 2015-01-07 ENCOUNTER — Encounter (HOSPITAL_COMMUNITY): Payer: Self-pay | Admitting: Family Medicine

## 2015-01-07 ENCOUNTER — Emergency Department (HOSPITAL_COMMUNITY): Payer: No Typology Code available for payment source

## 2015-01-07 DIAGNOSIS — M2550 Pain in unspecified joint: Secondary | ICD-10-CM | POA: Diagnosis present

## 2015-01-07 DIAGNOSIS — Z22322 Carrier or suspected carrier of Methicillin resistant Staphylococcus aureus: Secondary | ICD-10-CM | POA: Diagnosis not present

## 2015-01-07 DIAGNOSIS — K219 Gastro-esophageal reflux disease without esophagitis: Secondary | ICD-10-CM | POA: Diagnosis present

## 2015-01-07 DIAGNOSIS — K1230 Oral mucositis (ulcerative), unspecified: Secondary | ICD-10-CM | POA: Diagnosis present

## 2015-01-07 DIAGNOSIS — R634 Abnormal weight loss: Secondary | ICD-10-CM | POA: Diagnosis present

## 2015-01-07 DIAGNOSIS — M791 Myalgia, unspecified site: Secondary | ICD-10-CM | POA: Diagnosis present

## 2015-01-07 DIAGNOSIS — J9601 Acute respiratory failure with hypoxia: Secondary | ICD-10-CM | POA: Diagnosis not present

## 2015-01-07 DIAGNOSIS — J189 Pneumonia, unspecified organism: Principal | ICD-10-CM

## 2015-01-07 DIAGNOSIS — E43 Unspecified severe protein-calorie malnutrition: Secondary | ICD-10-CM | POA: Diagnosis present

## 2015-01-07 DIAGNOSIS — R5383 Other fatigue: Secondary | ICD-10-CM | POA: Diagnosis present

## 2015-01-07 DIAGNOSIS — E876 Hypokalemia: Secondary | ICD-10-CM | POA: Diagnosis present

## 2015-01-07 DIAGNOSIS — Z87891 Personal history of nicotine dependence: Secondary | ICD-10-CM

## 2015-01-07 DIAGNOSIS — Z8701 Personal history of pneumonia (recurrent): Secondary | ICD-10-CM

## 2015-01-07 DIAGNOSIS — R21 Rash and other nonspecific skin eruption: Secondary | ICD-10-CM | POA: Diagnosis present

## 2015-01-07 DIAGNOSIS — D509 Iron deficiency anemia, unspecified: Secondary | ICD-10-CM | POA: Diagnosis present

## 2015-01-07 DIAGNOSIS — R000 Tachycardia, unspecified: Secondary | ICD-10-CM

## 2015-01-07 DIAGNOSIS — R0602 Shortness of breath: Secondary | ICD-10-CM

## 2015-01-07 DIAGNOSIS — J849 Interstitial pulmonary disease, unspecified: Secondary | ICD-10-CM | POA: Diagnosis present

## 2015-01-07 DIAGNOSIS — R079 Chest pain, unspecified: Secondary | ICD-10-CM | POA: Insufficient documentation

## 2015-01-07 DIAGNOSIS — Z9889 Other specified postprocedural states: Secondary | ICD-10-CM

## 2015-01-07 DIAGNOSIS — L659 Nonscarring hair loss, unspecified: Secondary | ICD-10-CM | POA: Diagnosis present

## 2015-01-07 DIAGNOSIS — R042 Hemoptysis: Secondary | ICD-10-CM | POA: Diagnosis present

## 2015-01-07 DIAGNOSIS — R918 Other nonspecific abnormal finding of lung field: Secondary | ICD-10-CM | POA: Diagnosis present

## 2015-01-07 DIAGNOSIS — R509 Fever, unspecified: Secondary | ICD-10-CM | POA: Insufficient documentation

## 2015-01-07 HISTORY — DX: Unspecified osteoarthritis, unspecified site: M19.90

## 2015-01-07 HISTORY — DX: Gastro-esophageal reflux disease without esophagitis: K21.9

## 2015-01-07 HISTORY — DX: Headache, unspecified: R51.9

## 2015-01-07 HISTORY — DX: Headache: R51

## 2015-01-07 HISTORY — DX: Pneumonia, unspecified organism: J18.9

## 2015-01-07 LAB — BASIC METABOLIC PANEL
Anion gap: 11 (ref 5–15)
BUN: <5 mg/dL — ABNORMAL LOW (ref 6–23)
CO2: 23 mmol/L (ref 19–32)
Calcium: 8.9 mg/dL (ref 8.4–10.5)
Chloride: 101 mmol/L (ref 96–112)
Creatinine, Ser: 0.65 mg/dL (ref 0.50–1.10)
GFR calc non Af Amer: >90 mL/min (ref >90)
GLUCOSE: 115 mg/dL — AB (ref 70–99)
Potassium: 3.5 mmol/L (ref 3.5–5.1)
SODIUM: 135 mmol/L (ref 135–145)

## 2015-01-07 LAB — I-STAT CG4 LACTIC ACID, ED: LACTIC ACID, VENOUS: 1.05 mmol/L (ref 0.5–2.0)

## 2015-01-07 LAB — SEDIMENTATION RATE: Sed Rate: 43 mm/hr — ABNORMAL HIGH (ref 0–22)

## 2015-01-07 LAB — URINALYSIS, ROUTINE W REFLEX MICROSCOPIC
Bilirubin Urine: NEGATIVE
Glucose, UA: NEGATIVE mg/dL
HGB URINE DIPSTICK: NEGATIVE
Ketones, ur: NEGATIVE mg/dL
Nitrite: NEGATIVE
PH: 7 (ref 5.0–8.0)
Protein, ur: NEGATIVE mg/dL
Specific Gravity, Urine: 1.026 (ref 1.005–1.030)
Urobilinogen, UA: 0.2 mg/dL (ref 0.0–1.0)

## 2015-01-07 LAB — CBC
HCT: 38.4 % (ref 36.0–46.0)
HEMOGLOBIN: 12.7 g/dL (ref 12.0–15.0)
MCH: 26.6 pg (ref 26.0–34.0)
MCHC: 33.1 g/dL (ref 30.0–36.0)
MCV: 80.5 fL (ref 78.0–100.0)
Platelets: 229 10*3/uL (ref 150–400)
RBC: 4.77 MIL/uL (ref 3.87–5.11)
RDW: 13.1 % (ref 11.5–15.5)
WBC: 5 10*3/uL (ref 4.0–10.5)

## 2015-01-07 LAB — URINE MICROSCOPIC-ADD ON

## 2015-01-07 LAB — BRAIN NATRIURETIC PEPTIDE: B Natriuretic Peptide: 10.2 pg/mL (ref 0.0–100.0)

## 2015-01-07 LAB — I-STAT TROPONIN, ED: Troponin i, poc: 0 ng/mL (ref 0.00–0.08)

## 2015-01-07 LAB — STREP PNEUMONIAE URINARY ANTIGEN: Strep Pneumo Urinary Antigen: NEGATIVE

## 2015-01-07 MED ORDER — MORPHINE SULFATE 2 MG/ML IJ SOLN
1.0000 mg | INTRAMUSCULAR | Status: DC | PRN
  Administered 2015-01-08 – 2015-01-15 (×19): 2 mg via INTRAVENOUS
  Filled 2015-01-07 (×19): qty 1

## 2015-01-07 MED ORDER — MORPHINE SULFATE 4 MG/ML IJ SOLN
4.0000 mg | Freq: Once | INTRAMUSCULAR | Status: AC
  Administered 2015-01-07: 4 mg via INTRAVENOUS
  Filled 2015-01-07: qty 1

## 2015-01-07 MED ORDER — BENZONATATE 100 MG PO CAPS
100.0000 mg | ORAL_CAPSULE | Freq: Three times a day (TID) | ORAL | Status: DC
  Administered 2015-01-07 – 2015-01-15 (×22): 100 mg via ORAL
  Filled 2015-01-07 (×29): qty 1

## 2015-01-07 MED ORDER — GUAIFENESIN-DM 100-10 MG/5ML PO SYRP
5.0000 mL | ORAL_SOLUTION | ORAL | Status: DC | PRN
  Administered 2015-01-08 – 2015-01-09 (×4): 5 mL via ORAL
  Filled 2015-01-07 (×6): qty 5

## 2015-01-07 MED ORDER — ENOXAPARIN SODIUM 40 MG/0.4ML ~~LOC~~ SOLN
40.0000 mg | SUBCUTANEOUS | Status: DC
  Administered 2015-01-07 – 2015-01-09 (×3): 40 mg via SUBCUTANEOUS
  Filled 2015-01-07 (×4): qty 0.4

## 2015-01-07 MED ORDER — SODIUM CHLORIDE 0.9 % IV BOLUS (SEPSIS)
1000.0000 mL | Freq: Once | INTRAVENOUS | Status: AC
  Administered 2015-01-07: 1000 mL via INTRAVENOUS

## 2015-01-07 MED ORDER — DEXTROSE 5 % IV SOLN
1.0000 g | Freq: Once | INTRAVENOUS | Status: AC
  Administered 2015-01-07: 1 g via INTRAVENOUS
  Filled 2015-01-07: qty 10

## 2015-01-07 MED ORDER — DIPHENHYDRAMINE HCL 25 MG PO CAPS
25.0000 mg | ORAL_CAPSULE | Freq: Four times a day (QID) | ORAL | Status: DC | PRN
  Administered 2015-01-07: 25 mg via ORAL
  Filled 2015-01-07: qty 1

## 2015-01-07 MED ORDER — HYDROMORPHONE HCL 1 MG/ML IJ SOLN
1.0000 mg | INTRAMUSCULAR | Status: DC | PRN
  Administered 2015-01-07: 1 mg via INTRAVENOUS
  Filled 2015-01-07: qty 1

## 2015-01-07 MED ORDER — SODIUM CHLORIDE 0.9 % IV SOLN
INTRAVENOUS | Status: AC
  Administered 2015-01-07: 21:00:00 via INTRAVENOUS

## 2015-01-07 MED ORDER — DEXTROSE 5 % IV SOLN
500.0000 mg | Freq: Once | INTRAVENOUS | Status: AC
  Administered 2015-01-07: 500 mg via INTRAVENOUS
  Filled 2015-01-07: qty 500

## 2015-01-07 MED ORDER — IOHEXOL 350 MG/ML SOLN
80.0000 mL | Freq: Once | INTRAVENOUS | Status: AC | PRN
  Administered 2015-01-07: 80 mL via INTRAVENOUS

## 2015-01-07 MED ORDER — ACETAMINOPHEN 325 MG PO TABS
650.0000 mg | ORAL_TABLET | ORAL | Status: DC | PRN
  Administered 2015-01-07: 650 mg via ORAL
  Filled 2015-01-07: qty 2

## 2015-01-07 MED ORDER — ONDANSETRON HCL 4 MG/2ML IJ SOLN
4.0000 mg | Freq: Once | INTRAMUSCULAR | Status: AC
  Administered 2015-01-07: 4 mg via INTRAVENOUS
  Filled 2015-01-07: qty 2

## 2015-01-07 MED ORDER — LEVOFLOXACIN IN D5W 750 MG/150ML IV SOLN
750.0000 mg | INTRAVENOUS | Status: DC
  Administered 2015-01-08 – 2015-01-13 (×6): 750 mg via INTRAVENOUS
  Filled 2015-01-07 (×7): qty 150

## 2015-01-07 NOTE — ED Notes (Signed)
Admitting physician returned page and was notified of pt symptoms.  Further orders received.

## 2015-01-07 NOTE — Progress Notes (Signed)
New Admission Note:   Arrival Method:  Via wheelchair Mental Orientation: alert & oriented x4 Telemetry: ST Assessment: Completed Skin: Small scabbed area on left arm IV:NS@125  Pain: 7 out of 10 Tubes:n/a Safety Measures: Safety Fall Prevention Plan has been given, discussed and signed Admission: Completed [address] [place] Orientation: Patient has been orientated to the room, unit and staff.  Family: Aunt at bedside  Orders have been reviewed and implemented. Will continue to monitor the patient. Call light has been placed within reach and bed alarm has been activated.   De Nursey Cayli Escajeda BSN, Publishing copyN  Phone number: [phone]

## 2015-01-07 NOTE — ED Notes (Signed)
Pt returned to bed. Monitored by pulse ox, bp cuff, and 5-lead. 

## 2015-01-07 NOTE — ED Provider Notes (Signed)
CSN: [id number]     Arrival date & time 01/07/15  1251 History   First MD Initiated Contact with Patient 01/07/15 1356     Chief Complaint  Patient presents with  . Chest Pain  . Shortness of Breath     (Consider location/radiation/quality/duration/timing/severity/associated sxs/prior Treatment) HPI Comments: Patient presents with complaint of productive cough, shortness of breath with exertion, mid-chest pain which is sharp and pleuritic in nature for the past 2 days. Symptoms worse today. She felt warm last night and was sweating but denies documented fever. No URI symptoms. She is not coughing up blood. No nausea, vomiting, or abdominal pain. Patient complains of right ankle pain with radiation into her right calf. No swelling. This has been ongoing for 2-3 weeks. She states it feels like a pressure when she walks. No thigh swelling or pain. No recent surgeries or immobilizations. Patient was on Implanon until several months ago when this was removed. She is not currently on estrogens. She does not smoke. Patient has a strong family history of blood clots in her mother, aunt, brother but has never had a clot herself. Patient does not take aspirin or any other blood thinning medications.   Patient was seen in 08/2014 with signs and symptoms of a normal multifocal pneumonia. At that point she only had a cough. No fever or chest pain. She was treated with antibiotics and improved. Patient was also seen in emergency department on 12/18/14 with a rash. At that time she had a negative RPR and HIV.  Patient is a 24 y.o. female presenting with chest pain and shortness of breath. The history is provided by the patient and medical records.  Chest Pain Associated symptoms: shortness of breath   Associated symptoms: no abdominal pain, no cough, no fever, no headache, no nausea and not vomiting   Shortness of Breath Associated symptoms: chest pain   Associated symptoms: no abdominal pain, no cough, no  fever, no headaches, no rash, no sore throat and no vomiting     History reviewed. No pertinent past medical history. History reviewed. No pertinent past surgical history. History reviewed. No pertinent family history. History  Substance Use Topics  . Smoking status: Never Smoker   . Smokeless tobacco: Not on file  . Alcohol Use: No   OB History    No data available     Review of Systems  Constitutional: Negative for fever.  HENT: Negative for rhinorrhea and sore throat.   Eyes: Negative for redness.  Respiratory: Positive for chest tightness and shortness of breath. Negative for cough.   Cardiovascular: Positive for chest pain. Negative for leg swelling.  Gastrointestinal: Negative for nausea, vomiting, abdominal pain and diarrhea.  Genitourinary: Negative for dysuria.  Musculoskeletal: Positive for myalgias (R lower leg).  Skin: Negative for rash.  Neurological: Negative for headaches.    Allergies  Review of patient's allergies indicates no known allergies.  Home Medications   Prior to Admission medications   Medication Sig Start Date End Date Taking? Authorizing Provider  azithromycin (ZITHROMAX) 250 MG tablet Take 1 tablet (250 mg total) by mouth daily. Take first 2 tablets together, then 1 every day until finished. Patient not taking: Reported on 11/05/2014 09/18/14   Louann Sjogren, PA-C  benzonatate (TESSALON) 100 MG capsule Take 1 capsule (100 mg total) by mouth every 8 (eight) hours. Patient not taking: Reported on 11/05/2014 09/18/14   Louann Sjogren, PA-C  gabapentin (NEURONTIN) 300 MG capsule Take 300 mg by mouth 3 (  three) times daily.    Historical Provider, MD  ibuprofen (ADVIL,MOTRIN) 200 MG tablet Take 600 mg by mouth every 6 (six) hours as needed for mild pain.    Historical Provider, MD  metroNIDAZOLE (FLAGYL) 500 MG tablet Take 1 tablet (500 mg total) by mouth 2 (two) times daily. Patient not taking: Reported on 12/18/2014 11/05/14   Harle BattiestElizabeth  Tysinger, NP  polyethylene glycol powder (GLYCOLAX/MIRALAX) powder Take 17 g by mouth daily. Until daily soft stools  OTC Patient not taking: Reported on 12/18/2014 11/05/14   Harle BattiestElizabeth Tysinger, NP  predniSONE (DELTASONE) 20 MG tablet Take 3 tablets (60 mg total) by mouth daily. 12/18/14   Juliet RudeNathan R. Pickering, MD   BP 115/70 mmHg  Pulse 137  Temp(Src) 98.5 F (36.9 C) (Oral)  Resp 20  Ht 5\' 4"  (1.626 m)  SpO2 97%  LMP 12/27/2014   Physical Exam  Constitutional: She appears well-developed and well-nourished.  HENT:  Head: Normocephalic and atraumatic.  Mouth/Throat: Oropharynx is clear and moist.  Eyes: Conjunctivae are normal. Right eye exhibits no discharge. Left eye exhibits no discharge.  Neck: Normal range of motion. Neck supple.  Cardiovascular: Regular rhythm and normal heart sounds.  Tachycardia present.   No murmur heard. Pulmonary/Chest: Effort normal and breath sounds normal. No respiratory distress. She has no wheezes. She has no rales.  Abdominal: Soft. There is no tenderness. There is no rebound and no guarding.  Musculoskeletal: She exhibits tenderness. She exhibits no edema.  Patient reports tenderness to her right ankle joint. She has no current calf tenderness but states that the pain shoots up into her right calf at times.  Neurological: She is alert.  Skin: Skin is warm and dry.  Psychiatric: She has a normal mood and affect.  Nursing note and vitals reviewed.   ED Course  Procedures (including critical care time) Labs Review Labs Reviewed  BASIC METABOLIC PANEL - Abnormal; Notable for the following:    Glucose, Bld 115 (*)    BUN <5 (*)    All other components within normal limits  CBC  BRAIN NATRIURETIC PEPTIDE  I-STAT TROPOININ, ED  I-STAT CG4 LACTIC ACID, ED    Imaging Review Dg Chest 2 View  01/07/2015   CLINICAL DATA:  Chest pain, cough for 2 days  EXAM: CHEST  2 VIEW  COMPARISON:  09/18/2014  FINDINGS: Cardiomediastinal silhouette is stable.  Streaky bilateral lower lobe patchy infiltrates are noted highly suspicious for recurrent multifocal pneumonia. Follow-up to resolution after appropriate treatment recommended. No pulmonary edema.  IMPRESSION: Streaky bilateral lower lobe patchy infiltrates suspicious for recurrent multifocal pneumonia. Follow-up to resolution after appropriate treatment is recommended.   Electronically Signed   By: Natasha MeadLiviu  Pop M.D.   On: 01/07/2015 13:37   Ct Angio Chest Pe W/cm &/or Wo Cm  01/07/2015   CLINICAL DATA:  Multi focal infiltrates seen on xray, concern for PE 2 days of cough, SOB, chest pain, tachycardia and chills x 2 days and worsening. Hx of Pneumonia 08/2014. No hx of CA. No chest/lung surgeries.  EXAM: CT ANGIOGRAPHY CHEST WITH CONTRAST  TECHNIQUE: Multidetector CT imaging of the chest was performed using the standard protocol during bolus administration of intravenous contrast. Multiplanar CT image reconstructions and MIPs were obtained to evaluate the vascular anatomy.  CONTRAST:  80mL OMNIPAQUE IOHEXOL 350 MG/ML SOLN  COMPARISON:  Chest x-ray 01/07/2015 and 09/18/2014  FINDINGS: Lungs are somewhat hypoinflated demonstrate patchy subpleural opacification with cystic change most prominent over the mid to upper lungs  suggesting patchy areas of fibrosis. There are more patchy confluent areas of consolidation in the mid to lower lungs with air bronchograms. There is no evidence effusion. The airways are otherwise within normal.  Heart is normal in size. There is no evidence of pulmonary embolism. There is a 1 cm precarinal lymph node as well as a few subcentimeter mediastinal and hilar lymph nodes. Remainder of the mediastinal structures are unremarkable.  Images through the upper abdomen are unremarkable. Remaining bones soft tissues are unremarkable.  Review of the MIP images confirms the above findings.  IMPRESSION: No evidence of pulmonary embolism.  Patchy peripheral areas of airspace consolidation over the  mid to lower lungs with air bronchograms likely representing infection. There is patchy subpleural opacification with adjacent cystic change in the mid to upper lung suggesting underlying fibrosis. Part of this process is chronic given the chest x-ray findings dating back to October 2015. Would consider underlying interstitial disease possibly related to connective tissue/autoimmune disease or hypertensivity pneumonitis. Recommend follow-up to resolution as pulmonary consultation may be helpful.   Electronically Signed   By: Elberta Fortis M.D.   On: 01/07/2015 16:26     EKG Interpretation   Date/Time:  Wednesday January 07 2015 12:56:21 EST Ventricular Rate:  139 PR Interval:  104 QRS Duration: 80 QT Interval:  368 QTC Calculation: 559 R Axis:   82 Text Interpretation:  Sinus tachycardia with short PR Cannot rule out  Inferior infarct , age undetermined Abnormal ECG Confirmed by Rubin Payor   MD, NATHAN [phone]) on 01/07/2015 1:58:09 PM       2:13 PM Patient seen and examined. Work-up initiated. Medications ordered. Clinical concern for PE. CT angio ordered. Discussed with Dr. Rubin Payor.   Vital signs reviewed and are as follows: BP 115/70 mmHg  Pulse 137  Temp(Src) 98.5 F (36.9 C) (Oral)  Resp 20  Ht  (1.626 m)  SpO2 97%  LMP 12/27/2014  4:52 PM CT results as above. Pain is currently controlled. Will begin treatment for CAP. IV rocephin and azithro ordered. She is persistently tachycardic into 120s and uncomfortable. Feel that she will need admission given her persistent tachycardia and persistent symptoms.   4:57 PM Spoke with Dr. Blake Divine who will see patient.   BP 110/72 mmHg  Pulse 118  Temp(Src) 98.5 F (36.9 C) (Oral)  Resp 21  Ht  (1.626 m)  SpO2 98%  LMP 12/27/2014   MDM   Final diagnoses:  Community acquired pneumonia  Tachycardia   Admit.   Renne Crigler, PA-C 01/07/15 1700  Juliet Rude. Rubin Payor, MD 01/08/15 [phone]

## 2015-01-07 NOTE — H&P (Signed)
[facility] History and Physical  Kayla PonsLashonna Kichline ZOX:[id number] DOB: 04-26-1991 DOA: 01/07/2015  Referring physician: EDP  PCP: No PCP Per Patient   Chief Complaint: sob since 2 weeks.   HPI: Kayla [name] is a 24 y.o. female with no sig prior history comes in for sob since 2 weeks, associated with chest pain  Worsens with coughing and taking a deep breath. On arrival to ED. She was found to have multifocal pneumonia. She also reports subjective low grade fevers, chills and night sweats. She reports weight losso f 15lbs in 2 weeks, unintentional. She also reports loss of appetite and occasional constipation. She underwent CT angio of the chest in ED and was found to have multifocal pneumonia with cystic areas and fibrosis. She reports all her symptoms started from October 2015, when she was first diagnosed with pneumonia. She is referred to medical service for evaluation and management of CAP.    Review of Systems:  Constitutional:  Fevers, chills and night sweats present.  HEENT:  No Difficulty swallowing,Tooth/dental problems,Sore throat,. head ache present.  No sneezing, itching, ear ache, nasal congestion, post nasal drip,  Cardio-vascular:  No  Orthopnea, PND, swelling in lower extremities, anasarca, , palpitations . Chest pain and dizziness present.  GI:  No heartburn, indigestion, abdominal pain, nausea, vomiting, diarrhea,  Positive for constipation and , loss of appetite  Resp:   No coughing up of blood.No wheezing.No chest wall deformity positive for sob at rest and on exertion, with productive cough with yellow colored sputum.  Skin:  Malar erythematous rash over the cheek bones and nose since 2 weeks.  GU:  no dysuria, change in color of urine, no urgency or frequency. No flank pain.  Musculoskeletal:  Bilateral sharp shooting pain occasionally, right more than left.   Psych:  Anxious, tensed.   History reviewed. No pertinent past medical history. History  reviewed. No pertinent past surgical history. Social History:  reports that she has never smoked. She does not have any smokeless tobacco history on file. She reports that she does not drink alcohol or use illicit drugs.  Allergies  Allergen Reactions  . Zithromax [Azithromycin] Cough    History reviewed. Patient denies any family history of hypertension,  Auto immune diseases. COpd as far as she knows.  Family history of blood clots in her mother aunt and brother.   Prior to Admission medications   Medication Sig Start Date End Date Taking? Authorizing Provider  Multiple Vitamins-Minerals (MULTIVITAMIN PO) Take 1 tablet by mouth daily.   Yes Historical Provider, MD  azithromycin (ZITHROMAX) 250 MG tablet Take 1 tablet (250 mg total) by mouth daily. Take first 2 tablets together, then 1 every day until finished. Patient not taking: Reported on 11/05/2014 09/18/14   Louann SjogrenVictoria L Creech, PA-C  benzonatate (TESSALON) 100 MG capsule Take 1 capsule (100 mg total) by mouth every 8 (eight) hours. Patient not taking: Reported on 11/05/2014 09/18/14   Louann SjogrenVictoria L Creech, PA-C  ibuprofen (ADVIL,MOTRIN) 200 MG tablet Take 600 mg by mouth every 6 (six) hours as needed for mild pain.    Historical Provider, MD   Physical Exam: Filed Vitals:   01/07/15 1403 01/07/15 1500 01/07/15 1605 01/07/15 1615  BP:  103/62 151/101 110/72  Pulse: 120 104 120 118  Temp:      TempSrc:      Resp:  20 20 21   Height:      SpO2: 97% 100% 94% 98%    Wt Readings from Last 3 Encounters:  12/18/14 72.576 kg (160 lb)  11/05/14 78.472 kg (173 lb)  03/17/14 79.833 kg (176 lb)    General:  Appears ANXIOUS.  Eyes: PERRL, normal lids, irises & conjunctiva Neck: no LAD, masses or thyromegaly Cardiovascular: tachycardic,  no m/r/g. No LE edema. Respiratory: scattered rhonchi, no wheezing, air entry fair Gastrointestinal:soft, non tender non distended bowel sounds heard.  Skin: malar erythema over the cheek bones and  nose.  Musculoskeletal: grossly normal tone BUE/BLE Psychiatric: very anxious.  Neurologic: grossly non-focal.          Labs on Admission:  Basic Metabolic Panel:  Recent Labs Lab 01/07/15 1307  NA 135  K 3.5  CL 101  CO2 23  GLUCOSE 115*  BUN <5*  CREATININE 0.65  CALCIUM 8.9   Liver Function Tests: No results for input(s): AST, ALT, ALKPHOS, BILITOT, PROT, ALBUMIN in the last 168 hours. No results for input(s): LIPASE, AMYLASE in the last 168 hours. No results for input(s): AMMONIA in the last 168 hours. CBC:  Recent Labs Lab 01/07/15 1307  WBC 5.0  HGB 12.7  HCT 38.4  MCV 80.5  PLT 229   Cardiac Enzymes: No results for input(s): CKTOTAL, CKMB, CKMBINDEX, TROPONINI in the last 168 hours.  BNP (last 3 results)  Recent Labs  01/07/15 1307  BNP 10.2    ProBNP (last 3 results) No results for input(s): PROBNP in the last 8760 hours.  CBG: No results for input(s): GLUCAP in the last 168 hours.  Radiological Exams on Admission: Dg Chest 2 View  01/07/2015   CLINICAL DATA:  Chest pain, cough for 2 days  EXAM: CHEST  2 VIEW  COMPARISON:  09/18/2014  FINDINGS: Cardiomediastinal silhouette is stable. Streaky bilateral lower lobe patchy infiltrates are noted highly suspicious for recurrent multifocal pneumonia. Follow-up to resolution after appropriate treatment recommended. No pulmonary edema.  IMPRESSION: Streaky bilateral lower lobe patchy infiltrates suspicious for recurrent multifocal pneumonia. Follow-up to resolution after appropriate treatment is recommended.   Electronically Signed   By: Natasha Mead M.D.   On: 01/07/2015 13:37   Ct Angio Chest Pe W/cm &/or Wo Cm  01/07/2015   CLINICAL DATA:  Multi focal infiltrates seen on xray, concern for PE 2 days of cough, SOB, chest pain, tachycardia and chills x 2 days and worsening. Hx of Pneumonia 08/2014. No hx of CA. No chest/lung surgeries.  EXAM: CT ANGIOGRAPHY CHEST WITH CONTRAST  TECHNIQUE: Multidetector CT  imaging of the chest was performed using the standard protocol during bolus administration of intravenous contrast. Multiplanar CT image reconstructions and MIPs were obtained to evaluate the vascular anatomy.  CONTRAST:  80mL OMNIPAQUE IOHEXOL 350 MG/ML SOLN  COMPARISON:  Chest x-ray 01/07/2015 and 09/18/2014  FINDINGS: Lungs are somewhat hypoinflated demonstrate patchy subpleural opacification with cystic change most prominent over the mid to upper lungs suggesting patchy areas of fibrosis. There are more patchy confluent areas of consolidation in the mid to lower lungs with air bronchograms. There is no evidence effusion. The airways are otherwise within normal.  Heart is normal in size. There is no evidence of pulmonary embolism. There is a 1 cm precarinal lymph node as well as a few subcentimeter mediastinal and hilar lymph nodes. Remainder of the mediastinal structures are unremarkable.  Images through the upper abdomen are unremarkable. Remaining bones soft tissues are unremarkable.  Review of the MIP images confirms the above findings.  IMPRESSION: No evidence of pulmonary embolism.  Patchy peripheral areas of airspace consolidation over the mid to lower  lungs with air bronchograms likely representing infection. There is patchy subpleural opacification with adjacent cystic change in the mid to upper lung suggesting underlying fibrosis. Part of this process is chronic given the chest x-ray findings dating back to October 2015. Would consider underlying interstitial disease possibly related to connective tissue/autoimmune disease or hypertensivity pneumonitis. Recommend follow-up to resolution as pulmonary consultation may be helpful.   Electronically Signed   By: Elberta Fortis M.D.   On: 01/07/2015 16:26    EKG: reviewed, sinus tachycardic, with long QTC interval. At 130/min  Assessment/Plan Active Problems:   Community acquired pneumonia   CAP (community acquired pneumonia)  Community acquired  pneumonia Admitted to telemetry, started on IV rocephin and zithromax, but later changed to IV levaquin as she developed some itching at the site of infusion of the azithromycin . CT chest showed no pulmonary embolism, but patchy areas of airspace consolidation with cystic changes and underlying fibrosis. Blood cultures were not done prior to antibiotics. Sputum cultures ordered. Urine for strepto and legionella antigen ordered.  Her oxygen sats remained greater than 95% on RA. Please call pulmonology in am for the abnormal CT chest .    Tachycardia and tachypnea: probably from increased work of breathing. EKG reviewed and showed sinus tachycardia with increased QTc. Repeat EKG.    Chest pain: worsened with deep inspiration and coughing. Probably pleuritic chest pain. poc troponin negative.   Erythematous rash over the cheek bones: appear like malar rash, present for 2 weeks. Get ANA, lupus panel.     Code Status: full code.  DVT Prophylaxis: lovenox.  Family Communication: none at bedside Disposition Plan: admit to telemetry.   Time spent: 55 min  [facility] Pager [phone]

## 2015-01-07 NOTE — ED Notes (Addendum)
Pt is c/o pain to new IV site after initiation of medications.  Medications were stopped at this time per pt request.  Pt received aprox. 250mg  of Zithromax

## 2015-01-07 NOTE — ED Notes (Signed)
Pt sts 2 days of cough, SOB and chest pain x 2 days and worsening.

## 2015-01-07 NOTE — ED Notes (Signed)
Pt reports coughing and irritation to IV site.  Admitting physician paged for further orders r/t possible allergy.

## 2015-01-07 NOTE — ED Notes (Signed)
Pt ambulated to bathroom. Steady gait

## 2015-01-08 ENCOUNTER — Encounter (HOSPITAL_COMMUNITY): Payer: Self-pay | Admitting: General Practice

## 2015-01-08 ENCOUNTER — Telehealth: Payer: Self-pay | Admitting: Internal Medicine

## 2015-01-08 DIAGNOSIS — E43 Unspecified severe protein-calorie malnutrition: Secondary | ICD-10-CM | POA: Diagnosis present

## 2015-01-08 DIAGNOSIS — J189 Pneumonia, unspecified organism: Principal | ICD-10-CM

## 2015-01-08 DIAGNOSIS — R000 Tachycardia, unspecified: Secondary | ICD-10-CM

## 2015-01-08 LAB — LACTIC ACID, PLASMA
Lactic Acid, Venous: 1.1 mmol/L (ref 0.5–2.0)
Lactic Acid, Venous: 1.8 mmol/L (ref 0.5–2.0)

## 2015-01-08 LAB — EXTRACTABLE NUCLEAR ANTIGEN ANTIBODY
ENA SM AB SER-ACNC: NEGATIVE
SM/RNP: NEGATIVE
SSA (Ro) (ENA) Antibody, IgG: >8
SSB (LA) (ENA) ANTIBODY, IGG: POSITIVE
Scleroderma (Scl-70) (ENA) Antibody, IgG: <1
ds DNA Ab: 2 IU/mL

## 2015-01-08 LAB — PREGNANCY, URINE: Preg Test, Ur: NEGATIVE

## 2015-01-08 LAB — EXPECTORATED SPUTUM ASSESSMENT W GRAM STAIN, RFLX TO RESP C

## 2015-01-08 LAB — LEGIONELLA ANTIGEN, URINE

## 2015-01-08 LAB — JO-1 ANTIBODY-IGG: Jo-1 Antibody, IgG: <1

## 2015-01-08 LAB — EXPECTORATED SPUTUM ASSESSMENT W REFEX TO RESP CULTURE

## 2015-01-08 LAB — C-REACTIVE PROTEIN: CRP: 1.6 mg/dL — ABNORMAL HIGH (ref <0.60)

## 2015-01-08 LAB — TSH: TSH: 1.537 u[IU]/mL (ref 0.350–4.500)

## 2015-01-08 LAB — PROCALCITONIN

## 2015-01-08 MED ORDER — ENSURE COMPLETE PO LIQD
237.0000 mL | Freq: Two times a day (BID) | ORAL | Status: DC
  Administered 2015-01-08 – 2015-01-15 (×13): 237 mL via ORAL

## 2015-01-08 MED ORDER — SODIUM CHLORIDE 0.9 % IV SOLN
INTRAVENOUS | Status: AC
  Administered 2015-01-08 – 2015-01-09 (×2): via INTRAVENOUS

## 2015-01-08 MED ORDER — ACETAMINOPHEN 325 MG PO TABS
650.0000 mg | ORAL_TABLET | ORAL | Status: DC | PRN
  Administered 2015-01-08 – 2015-01-09 (×4): 650 mg via ORAL
  Filled 2015-01-08 (×4): qty 2

## 2015-01-08 MED ORDER — PNEUMOCOCCAL VAC POLYVALENT 25 MCG/0.5ML IJ INJ
0.5000 mL | INJECTION | INTRAMUSCULAR | Status: AC
  Administered 2015-01-08: 0.5 mL via INTRAMUSCULAR
  Filled 2015-01-08: qty 0.5

## 2015-01-08 MED ORDER — INFLUENZA VAC SPLIT QUAD 0.5 ML IM SUSY
0.5000 mL | PREFILLED_SYRINGE | INTRAMUSCULAR | Status: AC
  Administered 2015-01-08: 0.5 mL via INTRAMUSCULAR
  Filled 2015-01-08: qty 0.5

## 2015-01-08 NOTE — Telephone Encounter (Signed)
Kayla [name], got call from Dr Waymon AmatoHongalgi of [facility]. PAtient has autoimmune ILD - pleae give her first available fu with me. NEW consult  Dr. Kalman ShanMurali [name], M.D., [facility]aryF.C.C.P Pulmonary and Critical Care Medicine Staff Physician [facility] Pager: [phone], If no answer or between  15:00h - 7:00h: call [phone]  01/08/2015 4:20 PM

## 2015-01-08 NOTE — Progress Notes (Addendum)
Addendum  Discussed with pulmonology/Dr. Marchelle Gearingamaswamy: Reviewed CT chest and autoimmune panel. Concern for Sjogren's syndrome. Advised checking lactate, pro-calcitonin, RSV, urine Legionella and streptococcal antigen. If pro-calcitonin is low or normal, may consider stopping antibiotics after 5 days. She could spike fevers from underlying autoimmune disorders and may consider steroids. Did not see role for bronchoscopy. He has kindly agreed to see patient in outpatient consultation. He also recommended outpatient rheumatology consultation.  Marcellus ScottHONGALGI,Ameka Krigbaum, MD, FACP, FHM. [facility] Pager [phone]  If 7PM-7AM, please contact night-coverage [email or web address] Password [facility]ale[facility] 01/08/2015, 4:30 PM

## 2015-01-08 NOTE — Care Management Note (Signed)
  Page 1 of 1   01/08/2015     2:44:59 PM CARE MANAGEMENT NOTE 01/08/2015  Patient:  Lelan PonsRICE,Syrah   Account Number:  [id number]  Date Initiated:  01/08/2015  Documentation initiated by:  Ronny FlurryWILE,[place] Basham  Subjective/Objective Assessment:     Action/Plan:   Anticipated DC Date:     Anticipated DC Plan:           Choice offered to / List presented to:             Status of service:   Medicare Important Message given?   (If response is "NO", the following Medicare IM given date fields will be blank) Date Medicare IM given:   Medicare IM given by:   Date Additional Medicare IM given:   Additional Medicare IM given by:    Discharge Disposition:    Per UR Regulation:    If discussed at Long Length of Stay Meetings, dates discussed:    Comments:  01-08-15 Consult for PCP . Spoke with patient and family at bedside. Patient can call number on her insurance card and be provided with a list of PCP's in network with her insurance. If patient knows of a MD she would like to see , she can call office directly. Provided patient with [facility] number and [facility] number also. Ronny FlurryHeather Tonyia Marschall RN BSN [phone]

## 2015-01-08 NOTE — Progress Notes (Signed)
INITIAL NUTRITION ASSESSMENT  Pt meets criteria for SEVERE MALNUTRITION in the context of acute illness/injury as evidenced by a 19% weight loss in 2 months and energy intake of </= 50% for >/= 5 days.  DOCUMENTATION CODES Per approved criteria  -Severe malnutrition in the context of acute illness or injury   INTERVENTION: Continue Ensure Complete po BID, each supplement provides 350 kcal and 13 grams of protein.  Provide nourishment snacks (fruit cups, [place]urkey sandwich), Ordered.  Encourage adequate PO intake.  NUTRITION DIAGNOSIS: Inadequate oral intake related to early satiety as evidenced by meal completion of 50%.   Goal: Pt to meet >/= 90% of their estimated nutrition needs   Monitor:  PO intake, weight trends, labs, I/O's  Reason for Assessment: MD consult for assessment of nutrition requirements/status  24 y.o. female  Admitting Dx: CAP  ASSESSMENT: Pt with no sig prior history comes in for sob since 2 weeks, associated with chest pain Worsens with coughing and taking a deep breath. On arrival to ED. She was found to have multifocal pneumonia. She reports weight loss of 15lbs in 2 weeks, unintentional.   Pt reports having a decreased appetite/early satiety which has been ongoing over the past 2 months. She reports she has been trying to eat 3 meals a day, however consumes ~50% of each meal as she reports getting full fast. Pt reports weight loss of 30 lbs in the last 2 months due to the decrease in meal consumption. Noted pt with a 19% weight loss in 2 months. Pt reports prior to her sickness, she was eating great with no difficulties. Pt currently has Ensure ordered and is agreeable on trying them to aid in caloric and protein needs. Pt also requested additional snacks (fruit cups, [place]urkey sandwich). RD to order. Pt was educated to continue nutrition supplementation at home especially when intake is inadequate.   Pt with no observed significant fat or muscle mass  loss.  Labs: Low BUN.  Height: Ht Readings from Last 1 Encounters:  01/07/15 5\' 4"  (1.626 m)    Weight: Wt Readings from Last 1 Encounters:  01/07/15 140 lb (63.504 kg)    Ideal Body Weight: 120 lbs  % Ideal Body Weight: 117%  Wt Readings from Last 10 Encounters:  01/07/15 140 lb (63.504 kg)  12/18/14 160 lb (72.576 kg)  11/05/14 173 lb (78.472 kg)  03/17/14 176 lb (79.833 kg)    Usual Body Weight: 173 lbs  % Usual Body Weight: 81%  BMI:  Body mass index is 24.02 kg/(m^2).  Estimated Nutritional Needs: Kcal: 1950-2150 Protein: 80-100 grams Fluid: 1.95 - 2.15 L/day  Skin: intact  Diet Order:  Regular diet  EDUCATION NEEDS: -Education needs addressed   Intake/Output Summary (Last 24 hours) at 01/08/15 0926 Last data filed at 01/08/15 0455  Gross per 24 hour  Intake   1000 ml  Output      1 ml  Net    999 ml    Last BM: 2/17  Labs:   Recent Labs Lab 01/07/15 1307  NA 135  K 3.5  CL 101  CO2 23  BUN <5*  CREATININE 0.65  CALCIUM 8.9  GLUCOSE 115*    CBG (last 3)  No results for input(s): GLUCAP in the last 72 hours.  Scheduled Meds: . benzonatate  100 mg Oral Q8H  . enoxaparin (LOVENOX) injection  40 mg Subcutaneous Q24H  . feeding supplement (ENSURE COMPLETE)  237 mL Oral BID BM  . Influenza vac split  quadrivalent PF  0.5 mL Intramuscular Tomorrow-1000  . levofloxacin (LEVAQUIN) IV  750 mg Intravenous Q24H  . pneumococcal 23 valent vaccine  0.5 mL Intramuscular Tomorrow-1000    Continuous Infusions:   Past Medical History  Diagnosis Date  . Pneumonia 08/2014  . CAP (community acquired pneumonia) 01/07/2015  . GERD (gastroesophageal reflux disease)   . Daily headache     "sometimes" (01/08/2015)  . Arthritis     "hands and legs" (01/08/2015)    Past Surgical History  Procedure Laterality Date  . Finger surgery Right 03/2014    "laceration, nerve/artery injury" 2nd digit    Marijean Niemann, MS, RD, LDN Pager # [phone] After  hours/ weekend pager # [phone]

## 2015-01-08 NOTE — Progress Notes (Signed)
PROGRESS NOTE    Kayla [name] ZOX:[id number] DOB: 12-19-1990 DOA: 01/07/2015 PCP: No PCP Per Patient  HPI/Brief narrative 24 year old female patient with no known PMH, presented with 2 weeks history of dyspnea, pleuritic chest pain and cough. Chest x-ray was suggestive of multifocal pneumonia. CTA chest was negative for PE but showed airspace disease concerning for infection over possible ILD. Admitted for pneumonia. Treated for pneumonia by ED on 09/18/14 with azithromycin.   Assessment/Plan:  1. Community acquired pneumonia: Initially placed on IV Rocephin and azithromycin but developed pruritus with azithromycin. She was then switched to IV levofloxacin. Blood cultures 2: Pending. Sputum culture: Pending. Will need follow-up chest x-ray to ensure resolution of pneumonia findings. Improved. HIV screen negative on 12/18/14. Urine Legionella and streptococcal antigen negative. 2. Sepsis secondary to pneumonia: Spiked temperature of 102.5 early this morning and mildly tachycardic. IV fluids, antibiotics. Follow cultures. 3. ? Interstitial lung disease: Treat for pneumonia at this time and consider outpatient pulmonology follow-up for further evaluation. 4. Mild sinus tachycardia: Related to fever and infection. Continue monitoring on telemetry.  5. Malar rash: Unclear etiology. Autoimmune workup: SSA and SSB antibodies positive- ? SLE/SS. Patient stated that she had a dermatologist appointment for today- reschedule for outpatient follow-up. Consider OP Rheumatology consultation.   Code Status: Full Family Communication: None at bedside Disposition Plan: Home when medically stable   Consultants:  None  Procedures:  None  Antibiotics:  IV azithromycin 500 MG 1 dose  IV Rocephin 1 g 1 dose  IV levofloxacin 750 MG 2/17 >  Subjective: Overall feels better. Decreased chest pain on coughing and body aches.  Objective: Filed Vitals:   01/07/15 1615 01/07/15 1926 01/08/15  0458 01/08/15 1037  BP: 110/72 107/68 136/71 129/67  Pulse: 118 119 111 121  Temp:  102.5 F (39.2 C) 98.9 F (37.2 C) 100.8 F (38.2 C)  TempSrc:  Oral Oral Oral  Resp: Height:   (1.626 m)    Weight:  63.504 kg (140 lb)    SpO2: 98% 100% 92% 96%    Intake/Output Summary (Last 24 hours) at 01/08/15 1527 Last data filed at 01/08/15 1341  Gross per 24 hour  Intake   1960 ml  Output    851 ml  Net   1109 ml   Filed Weights   01/07/15 1926  Weight: 63.504 kg (140 lb)     Exam:  General exam: Pleasant young female, moderately built and nourished, lying comfortably propped up in bed without distress. Hyperpigmented bilateral malar rash. Respiratory system: Diminished breath sounds in the bases with scattered bilateral coarse crackles in the bases. Rest of lung fields clear to auscultation. No increased work of breathing. Cardiovascular system: S1 & S2 heard, regular and mildly tachycardic. No JVD, murmurs, gallops, clicks or pedal edema. Telemetry: Sinus tachycardia in the 110s-120s. Gastrointestinal system: Abdomen is nondistended, soft and nontender. Normal bowel sounds heard. Central nervous system: Alert and oriented. No focal neurological deficits. Extremities: Symmetric 5 x 5 power.   Data Reviewed: Basic Metabolic Panel:  Recent Labs Lab 01/07/15 1307  NA 135  K 3.5  CL 101  CO2 23  GLUCOSE 115*  BUN <5*  CREATININE 0.65  CALCIUM 8.9   Liver Function Tests: No results for input(s): AST, ALT, ALKPHOS, BILITOT, PROT, ALBUMIN in the last 168 hours. No results for input(s): LIPASE, AMYLASE in the last 168 hours. No results for input(s): AMMONIA in the last 168 hours. CBC:  Recent Labs  Lab 01/07/15 1307  WBC 5.0  HGB 12.7  HCT 38.4  MCV 80.5  PLT 229   Cardiac Enzymes: No results for input(s): CKTOTAL, CKMB, CKMBINDEX, TROPONINI in the last 168 hours. BNP (last 3 results) No results for input(s): PROBNP in the last 8760  hours. CBG: No results for input(s): GLUCAP in the last 168 hours.  Recent Results (from the past 240 hour(s))  Culture, blood (routine x 2)     Status: None (Preliminary result)   Collection Time: 01/07/15  8:58 PM  Result Value Ref Range Status   Specimen Description BLOOD RIGHT ARM  Final   Special Requests BOTTLES DRAWN AEROBIC AND ANAEROBIC 10CC  Final   Culture PENDING  Incomplete   Report Status PENDING  Incomplete  Culture, blood (routine x 2)     Status: None (Preliminary result)   Collection Time: 01/07/15  9:03 PM  Result Value Ref Range Status   Specimen Description BLOOD RIGHT HAND  Final   Special Requests BOTTLES DRAWN AEROBIC ONLY 10CC  Final   Culture PENDING  Incomplete   Report Status PENDING  Incomplete  Culture, sputum-assessment     Status: None   Collection Time: 01/07/15  9:12 PM  Result Value Ref Range Status   Specimen Description SPUTUM  Final   Special Requests NONE  Final   Sputum evaluation   Final    THIS SPECIMEN IS ACCEPTABLE. RESPIRATORY CULTURE REPORT TO FOLLOW.   Report Status 01/08/2015 FINAL  Final  Culture, respiratory (NON-Expectorated)     Status: None (Preliminary result)   Collection Time: 01/07/15  9:12 PM  Result Value Ref Range Status   Specimen Description SPUTUM  Final   Special Requests NONE  Final   Gram Stain   Final    MODERATE WBC PRESENT, PREDOMINANTLY PMN FEW SQUAMOUS EPITHELIAL CELLS PRESENT FEW GRAM POSITIVE COCCI IN PAIRS IN CHAINS IN CLUSTERS FEW GRAM NEGATIVE RODS [facility]formed at [facility]    Culture PENDING  Incomplete   Report Status PENDING  Incomplete           Studies: Dg Chest 2 View  01/07/2015   CLINICAL DATA:  Chest pain, cough for 2 days  EXAM: CHEST  2 VIEW  COMPARISON:  09/18/2014  FINDINGS: Cardiomediastinal silhouette is stable. Streaky bilateral lower lobe patchy infiltrates are noted highly suspicious for recurrent multifocal pneumonia. Follow-up to resolution after appropriate  treatment recommended. No pulmonary edema.  IMPRESSION: Streaky bilateral lower lobe patchy infiltrates suspicious for recurrent multifocal pneumonia. Follow-up to resolution after appropriate treatment is recommended.   Electronically Signed   By: Natasha MeadLiviu  Pop M.D.   On: 01/07/2015 13:37   Ct Angio Chest Pe W/cm &/or Wo Cm  01/07/2015   CLINICAL DATA:  Multi focal infiltrates seen on xray, concern for PE 2 days of cough, SOB, chest pain, tachycardia and chills x 2 days and worsening. Hx of Pneumonia 08/2014. No hx of CA. No chest/lung surgeries.  EXAM: CT ANGIOGRAPHY CHEST WITH CONTRAST  TECHNIQUE: Multidetector CT imaging of the chest was performed using the standard protocol during bolus administration of intravenous contrast. Multiplanar CT image reconstructions and MIPs were obtained to evaluate the vascular anatomy.  CONTRAST:  80mL OMNIPAQUE IOHEXOL 350 MG/ML SOLN  COMPARISON:  Chest x-ray 01/07/2015 and 09/18/2014  FINDINGS: Lungs are somewhat hypoinflated demonstrate patchy subpleural opacification with cystic change most prominent over the mid to upper lungs suggesting patchy areas of fibrosis. There are more patchy confluent areas of consolidation in the  mid to lower lungs with air bronchograms. There is no evidence effusion. The airways are otherwise within normal.  Heart is normal in size. There is no evidence of pulmonary embolism. There is a 1 cm precarinal lymph node as well as a few subcentimeter mediastinal and hilar lymph nodes. Remainder of the mediastinal structures are unremarkable.  Images through the upper abdomen are unremarkable. Remaining bones soft tissues are unremarkable.  Review of the MIP images confirms the above findings.  IMPRESSION: No evidence of pulmonary embolism.  Patchy peripheral areas of airspace consolidation over the mid to lower lungs with air bronchograms likely representing infection. There is patchy subpleural opacification with adjacent cystic change in the mid to  upper lung suggesting underlying fibrosis. Part of this process is chronic given the chest x-ray findings dating back to October 2015. Would consider underlying interstitial disease possibly related to connective tissue/autoimmune disease or hypertensivity pneumonitis. Recommend follow-up to resolution as pulmonary consultation may be helpful.   Electronically Signed   By: Elberta Fortis M.D.   On: 01/07/2015 16:26        Scheduled Meds: . benzonatate  100 mg Oral Q8H  . enoxaparin (LOVENOX) injection  40 mg Subcutaneous Q24H  . feeding supplement (ENSURE COMPLETE)  237 mL Oral BID BM  . levofloxacin (LEVAQUIN) IV  750 mg Intravenous Q24H   Continuous Infusions:   Active Problems:   Community acquired pneumonia   CAP (community acquired pneumonia)   Protein-calorie malnutrition, severe    Time spent: 40 minutes.    Marcellus Scott, MD, FACP, FHM. [facility] Pager [phone]  If 7PM-7AM, please contact night-coverage [email or web address] Password [email or web address] 01/08/2015, 3:27 PM    LOS: 1 day

## 2015-01-08 NOTE — Telephone Encounter (Signed)
Blocked spots ok for ILD and research

## 2015-01-08 NOTE — Progress Notes (Signed)
Utilization Review Completed.Samar Venneman T2/18/2016  

## 2015-01-08 NOTE — Telephone Encounter (Signed)
There is a blocked spot on 3/18 (1:30 and 1:45). Otherwise there aren't any openings till 02/24/2015.   MR please advise if ok to use 3/18 blocked spot. Thanks.

## 2015-01-09 ENCOUNTER — Encounter (HOSPITAL_COMMUNITY): Payer: Self-pay | Admitting: Internal Medicine

## 2015-01-09 DIAGNOSIS — R21 Rash and other nonspecific skin eruption: Secondary | ICD-10-CM

## 2015-01-09 DIAGNOSIS — R509 Fever, unspecified: Secondary | ICD-10-CM

## 2015-01-09 DIAGNOSIS — R042 Hemoptysis: Secondary | ICD-10-CM | POA: Diagnosis present

## 2015-01-09 DIAGNOSIS — R634 Abnormal weight loss: Secondary | ICD-10-CM | POA: Diagnosis present

## 2015-01-09 DIAGNOSIS — R918 Other nonspecific abnormal finding of lung field: Secondary | ICD-10-CM

## 2015-01-09 DIAGNOSIS — L659 Nonscarring hair loss, unspecified: Secondary | ICD-10-CM | POA: Diagnosis present

## 2015-01-09 DIAGNOSIS — E876 Hypokalemia: Secondary | ICD-10-CM

## 2015-01-09 DIAGNOSIS — J9601 Acute respiratory failure with hypoxia: Secondary | ICD-10-CM

## 2015-01-09 DIAGNOSIS — R5383 Other fatigue: Secondary | ICD-10-CM | POA: Diagnosis present

## 2015-01-09 DIAGNOSIS — R071 Chest pain on breathing: Secondary | ICD-10-CM

## 2015-01-09 DIAGNOSIS — M2550 Pain in unspecified joint: Secondary | ICD-10-CM | POA: Diagnosis present

## 2015-01-09 DIAGNOSIS — M791 Myalgia, unspecified site: Secondary | ICD-10-CM | POA: Diagnosis present

## 2015-01-09 LAB — BASIC METABOLIC PANEL
Anion gap: 3 — ABNORMAL LOW (ref 5–15)
BUN: <5 mg/dL — ABNORMAL LOW (ref 6–23)
CO2: 26 mmol/L (ref 19–32)
Calcium: 7.6 mg/dL — ABNORMAL LOW (ref 8.4–10.5)
Chloride: 106 mmol/L (ref 96–112)
Creatinine, Ser: 0.6 mg/dL (ref 0.50–1.10)
GFR calc Af Amer: >90 mL/min (ref >90)
GFR calc non Af Amer: >90 mL/min (ref >90)
Glucose, Bld: 96 mg/dL (ref 70–99)
Potassium: 3.3 mmol/L — ABNORMAL LOW (ref 3.5–5.1)
Sodium: 135 mmol/L (ref 135–145)

## 2015-01-09 LAB — CBC
HCT: 33.8 % — ABNORMAL LOW (ref 36.0–46.0)
Hemoglobin: 11 g/dL — ABNORMAL LOW (ref 12.0–15.0)
MCH: 26.4 pg (ref 26.0–34.0)
MCHC: 32.5 g/dL (ref 30.0–36.0)
MCV: 81.1 fL (ref 78.0–100.0)
PLATELETS: 236 10*3/uL (ref 150–400)
RBC: 4.17 MIL/uL (ref 3.87–5.11)
RDW: 13.2 % (ref 11.5–15.5)
WBC: 5.4 10*3/uL (ref 4.0–10.5)

## 2015-01-09 LAB — HIV ANTIBODY (ROUTINE TESTING W REFLEX): HIV SCREEN 4TH GENERATION: NONREACTIVE

## 2015-01-09 MED ORDER — IBUPROFEN 400 MG PO TABS
400.0000 mg | ORAL_TABLET | Freq: Three times a day (TID) | ORAL | Status: AC
  Administered 2015-01-09 – 2015-01-11 (×6): 400 mg via ORAL
  Filled 2015-01-09 (×2): qty 1
  Filled 2015-01-09: qty 2
  Filled 2015-01-09 (×2): qty 1
  Filled 2015-01-09: qty 2
  Filled 2015-01-09 (×2): qty 1

## 2015-01-09 MED ORDER — SODIUM CHLORIDE 0.9 % IV SOLN
INTRAVENOUS | Status: AC
  Administered 2015-01-09 – 2015-01-10 (×2): via INTRAVENOUS

## 2015-01-09 MED ORDER — POTASSIUM CHLORIDE CRYS ER 20 MEQ PO TBCR
40.0000 meq | EXTENDED_RELEASE_TABLET | Freq: Once | ORAL | Status: AC
  Administered 2015-01-09: 40 meq via ORAL
  Filled 2015-01-09: qty 2

## 2015-01-09 MED ORDER — SODIUM CHLORIDE 0.9 % IV SOLN
1.0000 g | Freq: Once | INTRAVENOUS | Status: AC
  Administered 2015-01-09: 1 g via INTRAVENOUS
  Filled 2015-01-09: qty 10

## 2015-01-09 NOTE — Progress Notes (Addendum)
PROGRESS NOTE    Kayla [name] ZOX:[id number] DOB: Apr 21, 1991 DOA: 01/07/2015 PCP: No PCP Per Patient  HPI/Brief narrative 24 year old female patient with no known PMH, presented with 2 weeks history of dyspnea, pleuritic chest pain and cough. Chest x-ray was suggestive of multifocal pneumonia. CTA chest was negative for PE but showed airspace disease concerning for infection over possible ILD. Admitted for pneumonia. Treated for pneumonia by ED on 09/18/14 with azithromycin.   Assessment/Plan:  1. Febrile illness: Treating for possible community-acquired pneumonia complicating underlying interstitial lung disease of undetermined etiology. Initially placed on IV Rocephin and azithromycin but developed pruritus with azithromycin. She was then switched to IV levofloxacin. Blood cultures 2: Negative to date. Sputum culture: Normal OP flora. HIV screen negative on 12/18/14. Urine Legionella and streptococcal antigen negative. Pro-calcitonin <0.10. Despite antibiotics, patient continues to spike high fevers. Infectious disease thereby consulted who recommended continuing levofloxacin, pulmonary consultation (called) and surgical consultation for skin biopsy (called) and further autoimmune workup. Her febrile illness may be related to autoimmune disease. If continues to spike fevers, may need to start steroids soon. 2. ? Interstitial lung disease: Treating for pneumonia at this time but concerned regarding underlying autoimmune disease with interstitial lung disease and intermittent mild hemoptysis. Pulmonology consulted. 3. Mild sinus tachycardia: Related to fever and infection. Continue monitoring on telemetry.  4. Malar rash: Unclear etiology. Autoimmune workup: SSA and SSB antibodies positive- ? SLE/SS. Patient stated that she had a dermatologist appointment for today- reschedule for outpatient follow-up. Recommend OP Rheumatology consultation. 5. Hypokalemia: Replace and follow. 6. Anemia: Follow  CBCs 7. Acute hypoxic respiratory failure: Overnight events noted. Patient became hypoxic to 75% on room air after ambulating to bathroom. Improved. Likely secondary to problem #2. Monitor closely   Code Status: Full Family Communication: None at bedside Disposition Plan: Home when medically stable   Consultants:  Infectious disease  Pulmonology-pending  General surgery-pending  Procedures:  None  Antibiotics:  IV azithromycin 500 MG 1 dose  IV Rocephin 1 g 1 dose  IV levofloxacin 750 MG 2/17 >  Subjective: Feels slightly better than on admission. Continues to complain of arthralgia, myalgia, anterior chest pain on coughing. Cough is mostly dry and no hemoptysis reported since admission.  Objective: Filed Vitals:   01/08/15 1700 01/08/15 2146 01/09/15 0401 01/09/15 0403  BP: 132/64 102/63 136/76   Pulse: 122 98 127   Temp: 102.9 F (39.4 C) 98.5 F (36.9 C) 103.1 F (39.5 C)   TempSrc: Oral Oral Oral   Resp: Height:   (1.626 m)    Weight:  64.311 kg (141 lb 12.5 oz)    SpO2: 98% 98% 83% 96%    Intake/Output Summary (Last 24 hours) at 01/09/15 1656 Last data filed at 01/09/15 0400  Gross per 24 hour  Intake    240 ml  Output   1701 ml  Net  -1461 ml   Filed Weights   01/07/15 1926 01/08/15 2146  Weight: 63.504 kg (140 lb) 64.311 kg (141 lb 12.5 oz)     Exam:  General exam: Pleasant young female, moderately built and nourished, lying comfortably propped up in bed without distress. Hypopigmented bilateral malar rash. Respiratory system: Diminished breath sounds in the bases with scattered bilateral coarse crackles in the bases. Rest of lung fields clear to auscultation. No increased work of breathing. Cardiovascular system: S1 & S2 heard, regular and mildly tachycardic. No JVD, murmurs, gallops, clicks or pedal edema. Telemetry: SR-ST up to  120's. Gastrointestinal system: Abdomen is nondistended, soft and nontender. Normal bowel sounds  heard. Central nervous system: Alert and oriented. No focal neurological deficits. Extremities: Symmetric 5 x 5 power.   Data Reviewed: Basic Metabolic Panel:  Recent Labs Lab 01/07/15 1307 01/09/15 0618  NA 135 135  K 3.5 3.3*  CL 101 106  CO2 23 26  GLUCOSE 115* 96  BUN <5* <5*  CREATININE 0.65 0.60  CALCIUM 8.9 7.6*   Liver Function Tests: No results for input(s): AST, ALT, ALKPHOS, BILITOT, PROT, ALBUMIN in the last 168 hours. No results for input(s): LIPASE, AMYLASE in the last 168 hours. No results for input(s): AMMONIA in the last 168 hours. CBC:  Recent Labs Lab 01/07/15 1307 01/09/15 0618  WBC 5.0 5.4  HGB 12.7 11.0*  HCT 38.4 33.8*  MCV 80.5 81.1  PLT 229 236   Cardiac Enzymes: No results for input(s): CKTOTAL, CKMB, CKMBINDEX, TROPONINI in the last 168 hours. BNP (last 3 results) No results for input(s): PROBNP in the last 8760 hours. CBG: No results for input(s): GLUCAP in the last 168 hours.  Recent Results (from the past 240 hour(s))  Culture, blood (routine x 2)     Status: None (Preliminary result)   Collection Time: 01/07/15  8:58 PM  Result Value Ref Range Status   Specimen Description BLOOD RIGHT ARM  Final   Special Requests BOTTLES DRAWN AEROBIC AND ANAEROBIC 10CC  Final   Culture   Final           BLOOD CULTURE RECEIVED NO GROWTH TO DATE CULTURE WILL BE HELD FOR 5 DAYS BEFORE ISSUING A FINAL NEGATIVE REPORT Performed at [facility]    Report Status PENDING  Incomplete  Culture, blood (routine x 2)     Status: None (Preliminary result)   Collection Time: 01/07/15  9:03 PM  Result Value Ref Range Status   Specimen Description BLOOD RIGHT HAND  Final   Special Requests BOTTLES DRAWN AEROBIC ONLY 10CC  Final   Culture   Final           BLOOD CULTURE RECEIVED NO GROWTH TO DATE CULTURE WILL BE HELD FOR 5 DAYS BEFORE ISSUING A FINAL NEGATIVE REPORT Note: Culture results may be compromised due to an excessive volume of blood  received in culture bottles. Performed at [facility]    Report Status PENDING  Incomplete  Culture, sputum-assessment     Status: None   Collection Time: 01/07/15  9:12 PM  Result Value Ref Range Status   Specimen Description SPUTUM  Final   Special Requests NONE  Final   Sputum evaluation   Final    THIS SPECIMEN IS ACCEPTABLE. RESPIRATORY CULTURE REPORT TO FOLLOW.   Report Status 01/08/2015 FINAL  Final  Culture, respiratory (NON-Expectorated)     Status: None (Preliminary result)   Collection Time: 01/07/15  9:12 PM  Result Value Ref Range Status   Specimen Description SPUTUM  Final   Special Requests NONE  Final   Gram Stain   Final    MODERATE WBC PRESENT, PREDOMINANTLY PMN FEW SQUAMOUS EPITHELIAL CELLS PRESENT FEW GRAM POSITIVE COCCI IN PAIRS IN CHAINS IN CLUSTERS FEW GRAM NEGATIVE RODS Performed at [facility]    Culture   Final    NORMAL OROPHARYNGEAL FLORA Performed at [facility]    Report Status PENDING  Incomplete           Studies: No results found.      Scheduled Meds: .  benzonatate  100 mg Oral Q8H  . enoxaparin (LOVENOX) injection  40 mg Subcutaneous Q24H  . feeding supplement (ENSURE COMPLETE)  237 mL Oral BID BM  . levofloxacin (LEVAQUIN) IV  750 mg Intravenous Q24H   Continuous Infusions:   Active Problems:   Pulmonary infiltrates   Protein-calorie malnutrition, severe   Unintentional weight loss   Malar rash   Alopecia   Hemoptysis   Myalgia   Arthralgia   Fatigue    Time spent: 30 minutes.    Marcellus ScottHONGALGI,Syrus Nakama, MD, FACP, FHM. [facility] Pager [phone]  If 7PM-7AM, please contact night-coverage [email or web address] Password [email or web address] 01/09/2015, 4:56 PM    LOS: 2 days

## 2015-01-09 NOTE — Progress Notes (Signed)
Patient ID: Lelan PonsLashonna Rockett, female   DOB: 05/24/1991, [age] y.o.   MRN: [id number] Called to consult for skin biopsy at 5pm today (Friday). There is not pathology support to do these over the weekend. Additionally, on review of notes, outpatient F/U is planned. If she is still here Monday, and you want a biopsy, please recall. Violeta GelinasBurke Eilyn Polack, MD, MPH, FACS Trauma: [phone] General Surgery: [phone]

## 2015-01-09 NOTE — Consult Note (Signed)
PULMONARY  / CRITICAL CARE MEDICINE CONSULTATION   Name: Kayla [name] MRN: [id number] DOB: [birth year]    ADMISSION DATE:  01/07/2015 CONSULTATION DATE: 01/09/2015  REQUESTING CLINICIAN: Dr. Algis Liming PRIMARY SERVICE: TRH  CHIEF COMPLAINT:  FUO  BRIEF PATIENT DESCRIPTION: 89 F with fevers, pulmonary opacities, and systemic complaints concerning for autoimmune disease.  SIGNIFICANT EVENTS / STUDIES:  CRP 1.6 ESR 43 TSH 1.537 Anti dsDNA, Jo, ENA, Ro, La, Scl-70, SM-RNP negative RPR Negative HIV negative Chest CT: Peripheral, patchy areas of opacities with ?cystic changes vs. honeycombing  LINES / TUBES: PIV  CULTURES: NGTD - Blood cultures 2/17 and 2/18 RVP Pending 2/18 Sputum Culture Pending 2/18  ANTIBIOTICS: Levaquin 2/17 - CTX and Azithro x 1 dose 2/17  HISTORY OF PRESENT ILLNESS:  Kayla [name] is a 24 yo F with no significant PMH who presented to [facility] on 2/17 for SOB and chest pain. The SOB began approximately 2 months prior to admission and is associated with a cough productive of purlent or bloody sputum. She sought medical attention for the cough in Oct and was diagnosed with CAP and given Azithromycin with no effect. She notes severe joint pains, and a rash on her lateral arms and buttocks.    PAST MEDICAL HISTORY :  Past Medical History  Diagnosis Date  . Pneumonia 08/2014  . CAP (community acquired pneumonia) 01/07/2015  . GERD (gastroesophageal reflux disease)   . Daily headache     "sometimes" (01/08/2015)  . Arthritis     "hands and legs" (01/08/2015)   Past Surgical History  Procedure Laterality Date  . Finger surgery Right 03/2014    "laceration, nerve/artery injury" 2nd digit   Prior to Admission medications   Medication Sig Start Date End Date Taking? Authorizing Provider  Multiple Vitamins-Minerals (MULTIVITAMIN PO) Take 1 tablet by mouth daily.   Yes Historical Provider, MD  azithromycin (ZITHROMAX) 250 MG tablet Take 1 tablet (250 mg total) by mouth  daily. Take first 2 tablets together, then 1 every day until finished. Patient not taking: Reported on 11/05/2014 09/18/14   Pura Spice, PA-C  benzonatate (TESSALON) 100 MG capsule Take 1 capsule (100 mg total) by mouth every 8 (eight) hours. Patient not taking: Reported on 11/05/2014 09/18/14   Pura Spice, PA-C  ibuprofen (ADVIL,MOTRIN) 200 MG tablet Take 600 mg by mouth every 6 (six) hours as needed for mild pain.    Historical Provider, MD   Allergies  Allergen Reactions  . Zithromax [Azithromycin] Cough    FAMILY HISTORY:  History reviewed. No pertinent family history. SOCIAL HISTORY:  reports that she quit smoking about 7 weeks ago. Her smoking use included Cigarettes. She has a .5 pack-year smoking history. She has never used smokeless tobacco. She reports that she drinks alcohol. She reports that she does not use illicit drugs.  REVIEW OF SYSTEMS:  A 12-system ROS was conducted and, unless otherwise specified in the HPI, was negative.   SUBJECTIVE:   VITAL SIGNS: Temp:  [97.2 F (36.2 C)-103.1 F (39.5 C)] 102.4 F (39.1 C) (02/19 1901) Pulse Rate:  [89-127] 100 (02/19 1826) Resp:  [20-23] 20 (02/19 1826) BP: (101-136)/(63-78) 101/70 mmHg (02/19 1826) SpO2:  [83 %-98 %] 98 % (02/19 1826) Weight:  [141 lb 12.5 oz (64.311 kg)] 141 lb 12.5 oz (64.311 kg) (02/18 2146) HEMODYNAMICS:   VENTILATOR SETTINGS:   INTAKE / OUTPUT: Intake/Output      02/19 0701 - 02/20 0700   P.O. 360   Total Intake(mL/kg) 360 (5.6)  Urine (mL/kg/hr) 1250 (1.6)   Stool 0 (0)   Total Output 1250   Net -890         PHYSICAL EXAMINATION: General:  WDWN F with mild tachypnea Neuro:  Intact HEENT:  Sclera anicteric, conjunctiva pink, MMM, OP notable for ? Hematomas on hard/soft palate Neck: Trachea supple and midline, (-) LAN or JVD Cardiovascular:  RRR, NS1/S2, (-) MRG Lungs:  Mild crackles at right base, otherwise CTAB Abdomen:  S/NT/ND/(+)BS Musculoskeletal:  (-)  C/C/E Skin:  Platches of discolored skin on lateral arms bilaterally  LABS:  CBC  Recent Labs Lab 01/07/15 1307 01/09/15 0618  WBC 5.0 5.4  HGB 12.7 11.0*  HCT 38.4 33.8*  PLT 229 236   Coag's No results for input(s): APTT, INR in the last 168 hours. BMET  Recent Labs Lab 01/07/15 1307 01/09/15 0618  NA 135 135  K 3.5 3.3*  CL 101 106  CO2 23 26  BUN <5* <5*  CREATININE 0.65 0.60  GLUCOSE 115* 96   Electrolytes  Recent Labs Lab 01/07/15 1307 01/09/15 0618  CALCIUM 8.9 7.6*   Sepsis Markers  Recent Labs Lab 01/07/15 1458 01/08/15 1705 01/08/15 1715 01/08/15 1939  LATICACIDVEN 1.05  --  1.1 1.8  PROCALCITON  --  <0.10  --   --    ABG No results for input(s): PHART, PCO2ART, PO2ART in the last 168 hours. Liver Enzymes No results for input(s): AST, ALT, ALKPHOS, BILITOT, ALBUMIN in the last 168 hours. Cardiac Enzymes No results for input(s): TROPONINI, PROBNP in the last 168 hours. Glucose No results for input(s): GLUCAP in the last 168 hours.  Imaging No results found.  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 No obvious pattern on CT (UIP, NSIP, etc). Bronchoscopy is reasonable to help exclude infection. Would NOT start steroids until she has a bronchoscopy unless she decompensates. P:   NPO after midnight for possible bronch in AM Would send bronch for cell count/Diff, culture and would suggest TBBx's as well Probably doesn't need O2 supplementation, a sat of >= 92% is fine  CARDIOVASCULAR A: No acute issues  RENAL A: Hypokalemia: Repleted by primary team  Hypocalcemia: P:   Replete Ca  GASTROINTESTINAL A: No acute issues  HEMATOLOGIC A: Anemia:  P:   Send Iron Panel  INFECTIOUS A: Possible multifocal pneumonia: Doubt this actually represents pneumonia but coverage with levaquin is ok for now. P:   ABx per  ID  ENDOCRINE A: No acute issues  NEUROLOGIC A: No acute issues  TODAY'S SUMMARY:   I have personally obtained a history, examined the patient, evaluated laboratory and imaging results, formulated the assessment and plan and placed orders.  Kayla Asal, MD Pulmonary and [place] Pager: [phone]   01/09/2015, 7:30 PM

## 2015-01-09 NOTE — Telephone Encounter (Signed)
Called and spoke to pt. Appt made with MR on 02/06/2015. Pt verbalized understanding and denied any further questions or concerns at this time.

## 2015-01-09 NOTE — Progress Notes (Signed)
Patient alert. C/o SOB and trouble breathing with chest pain after ambulating to bathroom. O2 sats at 75% on RA upon return from bathroom. Encouraged to deep breathe, O2 sats up to 81% on RA. Placed patient on non-rebreather. O2 sats increased to 100%. Patient verbalizes relief with chest pain. Patient currently comfortable and placed on 2L via [place] with O2 sats at 92-94%. Will continue to monitor.
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
## 2015-01-10 DIAGNOSIS — R05 Cough: Secondary | ICD-10-CM

## 2015-01-10 DIAGNOSIS — M2550 Pain in unspecified joint: Secondary | ICD-10-CM

## 2015-01-10 DIAGNOSIS — R079 Chest pain, unspecified: Secondary | ICD-10-CM | POA: Insufficient documentation

## 2015-01-10 LAB — COMPREHENSIVE METABOLIC PANEL
ALT: 34 U/L (ref 0–35)
ANION GAP: 6 (ref 5–15)
AST: 78 U/L — AB (ref 0–37)
Albumin: 2.6 g/dL — ABNORMAL LOW (ref 3.5–5.2)
Alkaline Phosphatase: 73 U/L (ref 39–117)
BILIRUBIN TOTAL: 0.5 mg/dL (ref 0.3–1.2)
BUN: <5 mg/dL — ABNORMAL LOW (ref 6–23)
CHLORIDE: 111 mmol/L (ref 96–112)
CO2: 22 mmol/L (ref 19–32)
CREATININE: 0.46 mg/dL — AB (ref 0.50–1.10)
Calcium: 8.4 mg/dL (ref 8.4–10.5)
GFR calc Af Amer: >90 mL/min (ref >90)
GFR calc non Af Amer: >90 mL/min (ref >90)
GLUCOSE: 90 mg/dL (ref 70–99)
Potassium: 3.9 mmol/L (ref 3.5–5.1)
SODIUM: 139 mmol/L (ref 135–145)
Total Protein: 6.3 g/dL (ref 6.0–8.3)

## 2015-01-10 LAB — CULTURE, RESPIRATORY: CULTURE: NORMAL

## 2015-01-10 LAB — PROCALCITONIN: Procalcitonin: <0.1 ng/mL

## 2015-01-10 LAB — CBC
HCT: 34.4 % — ABNORMAL LOW (ref 36.0–46.0)
HEMOGLOBIN: 11.3 g/dL — AB (ref 12.0–15.0)
MCH: 26.6 pg (ref 26.0–34.0)
MCHC: 32.8 g/dL (ref 30.0–36.0)
MCV: 80.9 fL (ref 78.0–100.0)
Platelets: 238 10*3/uL (ref 150–400)
RBC: 4.25 MIL/uL (ref 3.87–5.11)
RDW: 13.6 % (ref 11.5–15.5)
WBC: 4.9 10*3/uL (ref 4.0–10.5)

## 2015-01-10 LAB — SURGICAL PCR SCREEN
MRSA, PCR: POSITIVE — AB
STAPHYLOCOCCUS AUREUS: POSITIVE — AB

## 2015-01-10 LAB — HEPATITIS C ANTIBODY: HCV AB: NEGATIVE

## 2015-01-10 LAB — FERRITIN: Ferritin: 649 ng/mL — ABNORMAL HIGH (ref 10–291)

## 2015-01-10 LAB — HEPATITIS B SURFACE ANTIGEN: HEP B S AG: NEGATIVE

## 2015-01-10 LAB — IRON AND TIBC
IRON: 16 ug/dL — AB (ref 42–145)
Saturation Ratios: 8 % — ABNORMAL LOW (ref 20–55)
TIBC: 204 ug/dL — ABNORMAL LOW (ref 250–470)
UIBC: 188 ug/dL (ref 125–400)

## 2015-01-10 MED ORDER — CHLORHEXIDINE GLUCONATE CLOTH 2 % EX PADS
6.0000 | MEDICATED_PAD | Freq: Every day | CUTANEOUS | Status: DC
  Administered 2015-01-11 – 2015-01-14 (×4): 6 via TOPICAL

## 2015-01-10 MED ORDER — PANTOPRAZOLE SODIUM 40 MG PO TBEC
40.0000 mg | DELAYED_RELEASE_TABLET | Freq: Two times a day (BID) | ORAL | Status: DC
  Administered 2015-01-10 – 2015-01-15 (×10): 40 mg via ORAL
  Filled 2015-01-10 (×9): qty 1

## 2015-01-10 MED ORDER — MUPIROCIN 2 % EX OINT
1.0000 "application " | TOPICAL_OINTMENT | Freq: Two times a day (BID) | CUTANEOUS | Status: AC
  Administered 2015-01-10 – 2015-01-15 (×10): 1 via NASAL
  Filled 2015-01-10 (×2): qty 22

## 2015-01-10 MED ORDER — ACETAMINOPHEN 650 MG RE SUPP: 650.0000 mg | Freq: Four times a day (QID) | RECTAL | Status: DC | PRN

## 2015-01-10 MED ORDER — DM-GUAIFENESIN ER 30-600 MG PO TB12
2.0000 | ORAL_TABLET | Freq: Two times a day (BID) | ORAL | Status: DC
  Administered 2015-01-10 – 2015-01-15 (×11): 2 via ORAL
  Filled 2015-01-10 (×13): qty 2

## 2015-01-10 MED ORDER — DIPHENHYDRAMINE HCL 50 MG/ML IJ SOLN: 12.5000 mg | Freq: Three times a day (TID) | INTRAMUSCULAR | Status: DC | PRN

## 2015-01-10 MED ORDER — METHYLPREDNISOLONE SODIUM SUCC 125 MG IJ SOLR
80.0000 mg | Freq: Two times a day (BID) | INTRAMUSCULAR | Status: DC
  Administered 2015-01-10 – 2015-01-14 (×9): 80 mg via INTRAVENOUS
  Filled 2015-01-10: qty 2
  Filled 2015-01-10 (×8): qty 1.28
  Filled 2015-01-10: qty 2

## 2015-01-10 NOTE — Consult Note (Deleted)
PULMONARY  / CRITICAL CARE MEDICINE CONSULTATION   Name: Kayla [name] MRN: [id number] DOB: 11-May-1991    ADMISSION DATE:  01/07/2015 CONSULTATION DATE: 01/09/2015  REQUESTING CLINICIAN: Dr. Algis Liming PRIMARY SERVICE: TRH  CHIEF COMPLAINT:  FUO/pulmonary infiltrates present since at least 09/18/14   BRIEF PATIENT DESCRIPTION: 50 F with fevers, pulmonary opacities, and systemic complaints concerning for autoimmune disease.  SIGNIFICANT EVENTS / STUDIES:  CRP 1.6 ESR 43 TSH 1.537 Anti dsDNA, Jo, ENA, Ro, La, Scl-70, SM-RNP negative RPR Negative HIV negative Chest CT: Peripheral, patchy areas of opacities with ?cystic changes vs. Honeycombing - solumedrol 80 q12 started 01/10/15   LINES / TUBES: PIV  CULTURES: NGTD - Blood cultures 2/17 and 2/18 RVP Pending 2/18 Sputum Culture Pending 2/18  ANTIBIOTICS: Levaquin 2/17 >>> CTX and Azithro x 1 dose 2/17   SUBJECTIVE:  Hacking cough productive of minimal rusty sputum / no increased wob on 3lpm   VITAL SIGNS: Temp:  [97.2 F (36.2 C)-102.4 F (39.1 C)] 98.4 F (36.9 C) (02/20 0455) Pulse Rate:  [89-113] 95 (02/20 0455) Resp:  [18-22] 18 (02/20 0455) BP: (101-113)/(63-78) 104/63 mmHg (02/20 0455) SpO2:  [95 %-99 %] 95 % (02/20 0455) Weight:  [143 lb 14.4 oz (65.273 kg)] 143 lb 14.4 oz (65.273 kg) (02/19 1940) HEMODYNAMICS:   VENTILATOR SETTINGS:   INTAKE / OUTPUT: Intake/Output      02/19 0701 - 02/20 0700 02/20 0701 - 02/21 0700   P.O. 360    Total Intake(mL/kg) 360 (5.5)    Urine (mL/kg/hr) 3100 (2)    Stool 0 (0)    Total Output 3100     Net -2740            PHYSICAL EXAMINATION: General:  WDWNBF with mild tachypnea Neuro:  Intact HEENT:  Sclera anicteric, conjunctiva pink, MMM, OP notable for ? Hematomas on hard/soft palate Neck: Trachea supple and midline, (-) LAN or JVD Cardiovascular:  RRR, NS1/S2, (-) MRG Lungs:  Coarse classic BV changes bilaterally  Abdomen:  S/NT/ND/(+)BS Musculoskeletal:  (-)  C/C/E Skin:  Platches of discolored skin on lateral arms bilaterally  LABS:  CBC  Recent Labs Lab 01/07/15 1307 01/09/15 0618 01/10/15 0620  WBC 5.0 5.4 4.9  HGB 12.7 11.0* 11.3*  HCT 38.4 33.8* 34.4*  PLT 229 236 238   Coag's No results for input(s): APTT, INR in the last 168 hours. BMET  Recent Labs Lab 01/07/15 1307 01/09/15 0618 01/10/15 0620  NA 135 135 139  K 3.5 3.3* 3.9  CL 101 106 111  CO2 '23 26 22  ' BUN <5* <5* <5*  CREATININE 0.65 0.60 0.46*  GLUCOSE 115* 96 90   Electrolytes  Recent Labs Lab 01/07/15 1307 01/09/15 0618 01/10/15 0620  CALCIUM 8.9 7.6* 8.4   Sepsis Markers  Recent Labs Lab 01/07/15 1458 01/08/15 1705 01/08/15 1715 01/08/15 1939  LATICACIDVEN 1.05  --  1.1 1.8  PROCALCITON  --  <0.10  --   --    ABG No results for input(s): PHART, PCO2ART, PO2ART in the last 168 hours. Liver Enzymes  Recent Labs Lab 01/10/15 0620  AST 78*  ALT 34  ALKPHOS 73  BILITOT 0.5  ALBUMIN 2.6*   Cardiac Enzymes No results for input(s): TROPONINI, PROBNP in the last 168 hours. Glucose No results for input(s): GLUCAP in the last 168 hours.  Imaging No results found.  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  No obvious pattern on CT (UIP, NSIP, etc).   - P:   Start solumedrol 80 q12 01/10/15 and re- eval Monday am 2/22 re ? Fob needed  - check for Sickle cell dz/ recheck ana / anca     HEMATOLOGIC A: Anemia:  - Iron Panel  01/09/15 c/w fe def  P: D/c'd lovenox as may need fob with tbbx and low grade hempotysis suggests alv hem?      INFECTIOUS - PCT < 0.10 2/18 A: Possible multifocal pneumonia: Doubt this actually represents pneumonia but coverage with levaquin is ok for now. P:   ABx per ID      Discussed in detail all the  indications, usual  risks and alternatives  relative to the benefits with patient who agrees to  proceed with 48 h steroids then fob 2/22 if no clear response     Christinia Gully, MD Pulmonary and [place] [phone] After 5:30 PM or weekends, call [phone]

## 2015-01-10 NOTE — Progress Notes (Signed)
INFECTIOUS DISEASE PROGRESS NOTE  ID: Kayla [name] is a 24 y.o. female with  Active Problems:   Pulmonary infiltrates   Protein-calorie malnutrition, severe   Unintentional weight loss   Malar rash   Alopecia   Hemoptysis   Myalgia   Arthralgia   Fatigue  Subjective: Fever overnight C/o joint pain, chest pain, cough.   Abtx:  Anti-infectives    Start     Dose/Rate Route Frequency Ordered Stop   01/08/15 0600  levofloxacin (LEVAQUIN) IVPB 750 mg     750 mg 100 mL/hr over 90 Minutes Intravenous Every 24 hours 01/07/15 1900     01/07/15 1645  cefTRIAXone (ROCEPHIN) 1 g in dextrose 5 % 50 mL IVPB     1 g 100 mL/hr over 30 Minutes Intravenous  Once 01/07/15 1636 01/07/15 1720   01/07/15 1645  azithromycin (ZITHROMAX) 500 mg in dextrose 5 % 250 mL IVPB     500 mg 250 mL/hr over 60 Minutes Intravenous  Once 01/07/15 1636 01/07/15 1818      Medications:  Scheduled: . benzonatate  100 mg Oral Q8H  . dextromethorphan-guaiFENesin  2 tablet Oral BID  . feeding supplement (ENSURE COMPLETE)  237 mL Oral BID BM  . ibuprofen  400 mg Oral TID  . levofloxacin (LEVAQUIN) IV  750 mg Intravenous Q24H  . methylPREDNISolone (SOLU-MEDROL) injection  80 mg Intravenous Q12H  . pantoprazole  40 mg Oral BID AC    Objective: Vital signs in last 24 hours: Temp:  [97.2 F (36.2 C)-102.4 F (39.1 C)] 98.6 F (37 C) (02/20 1000) Pulse Rate:  [89-113] 105 (02/20 1000) Resp:  [18-22] 18 (02/20 1000) BP: (101-113)/(63-78) 110/72 mmHg (02/20 1000) SpO2:  [95 %-99 %] 96 % (02/20 1000) Weight:  [65.273 kg (143 lb 14.4 oz)] 65.273 kg (143 lb 14.4 oz) (02/19 1940)   General appearance: alert, cooperative and no distress Resp: clear to auscultation bilaterally Cardio: regular rate and rhythm GI: normal findings: bowel sounds normal and soft, non-tender Extremities: no joint tenderness or swelling.   Lab Results  Recent Labs  01/09/15 0618 01/10/15 0620  WBC 5.4 4.9  HGB 11.0* 11.3*    HCT 33.8* 34.4*  NA 135 139  K 3.3* 3.9  CL 106 111  CO2 26 22  BUN <5* <5*  CREATININE 0.60 0.46*   Liver Panel  Recent Labs  01/10/15 0620  PROT 6.3  ALBUMIN 2.6*  AST 78*  ALT 34  ALKPHOS 73  BILITOT 0.5   Sedimentation Rate  Recent Labs  01/07/15 2058  ESRSEDRATE 43*   C-Reactive Protein  Recent Labs  01/07/15 2058  CRP 1.6*    Microbiology: Recent Results (from the past 240 hour(s))  Culture, blood (routine x 2)     Status: None (Preliminary result)   Collection Time: 01/07/15  8:58 PM  Result Value Ref Range Status   Specimen Description BLOOD RIGHT ARM  Final   Special Requests BOTTLES DRAWN AEROBIC AND ANAEROBIC 10CC  Final   Culture   Final           BLOOD CULTURE RECEIVED NO GROWTH TO DATE CULTURE WILL BE HELD FOR 5 DAYS BEFORE ISSUING A FINAL NEGATIVE REPORT Performed at [facility]    Report Status PENDING  Incomplete  Culture, blood (routine x 2)     Status: None (Preliminary result)   Collection Time: 01/07/15  9:03 PM  Result Value Ref Range Status   Specimen Description BLOOD RIGHT HAND  Final   Special Requests BOTTLES DRAWN AEROBIC ONLY 10CC  Final   Culture   Final           BLOOD CULTURE RECEIVED NO GROWTH TO DATE CULTURE WILL BE HELD FOR 5 DAYS BEFORE ISSUING A FINAL NEGATIVE REPORT Note: Culture results may be compromised due to an excessive volume of blood received in culture bottles. Performed at [facility]    Report Status PENDING  Incomplete  Culture, sputum-assessment     Status: None   Collection Time: 01/07/15  9:12 PM  Result Value Ref Range Status   Specimen Description SPUTUM  Final   Special Requests NONE  Final   Sputum evaluation   Final    THIS SPECIMEN IS ACCEPTABLE. RESPIRATORY CULTURE REPORT TO FOLLOW.   Report Status 01/08/2015 FINAL  Final  Culture, respiratory (NON-Expectorated)     Status: None (Preliminary result)   Collection Time: 01/07/15  9:12 PM  Result Value Ref Range Status    Specimen Description SPUTUM  Final   Special Requests NONE  Final   Gram Stain   Final    MODERATE WBC PRESENT, PREDOMINANTLY PMN FEW SQUAMOUS EPITHELIAL CELLS PRESENT FEW GRAM POSITIVE COCCI IN PAIRS IN CHAINS IN CLUSTERS FEW GRAM NEGATIVE RODS Performed at [facility]    Culture   Final    NORMAL OROPHARYNGEAL FLORA Performed at [facility]    Report Status PENDING  Incomplete  Culture, blood (routine x 2)     Status: None (Preliminary result)   Collection Time: 01/08/15  5:05 PM  Result Value Ref Range Status   Specimen Description BLOOD RIGHT ARM  Final   Special Requests BOTTLES DRAWN AEROBIC ONLY 6CC  Final   Culture   Final           BLOOD CULTURE RECEIVED NO GROWTH TO DATE CULTURE WILL BE HELD FOR 5 DAYS BEFORE ISSUING A FINAL NEGATIVE REPORT Performed at [facility]    Report Status PENDING  Incomplete  Culture, blood (routine x 2)     Status: None (Preliminary result)   Collection Time: 01/08/15  5:15 PM  Result Value Ref Range Status   Specimen Description BLOOD RIGHT HAND  Final   Special Requests BOTTLES DRAWN AEROBIC ONLY 10CC  Final   Culture   Final           BLOOD CULTURE RECEIVED NO GROWTH TO DATE CULTURE WILL BE HELD FOR 5 DAYS BEFORE ISSUING A FINAL NEGATIVE REPORT Performed at [facility]    Report Status PENDING  Incomplete  Surgical pcr screen     Status: Abnormal   Collection Time: 01/10/15  5:15 AM  Result Value Ref Range Status   MRSA, PCR POSITIVE (A) NEGATIVE Final   Staphylococcus aureus POSITIVE (A) NEGATIVE Final    Comment:        The Xpert SA Assay (FDA approved for NASAL specimens in patients over 25 years of age), is one component of a comprehensive surveillance program.  Test performance has been validated by [facility] for patients greater than or equal to [address]104 year old. It is not intended to diagnose infection nor to guide or monitor treatment.     Studies/Results: No results  found.   Assessment/Plan: Fever Pulmonary infiltrates  Total days of antibiotics 4 (3 levaquin) Solumedrol  Possible bronch on 2-22.  No change in anbx for now.          Johny SaxJeffrey Ernan Runkles Infectious Diseases (pager) [phone]  [email or web address] 01/10/2015, 11:14 AM  LOS: 3 days

## 2015-01-10 NOTE — Progress Notes (Signed)
PULMONARY  / CRITICAL CARE MEDICINE CONSULTATION   Name: Kayla [name] MRN: [id number] DOB: [birth year]    ADMISSION DATE:  01/07/2015 CONSULTATION DATE: 01/09/2015  REQUESTING CLINICIAN: Dr. Algis Liming PRIMARY SERVICE: TRH  CHIEF COMPLAINT:  FUO/pulmonary infiltrates present since at least 09/18/14   BRIEF PATIENT DESCRIPTION: 35 F with fevers, pulmonary opacities, and systemic complaints concerning for autoimmune disease.  SIGNIFICANT EVENTS / STUDIES:  CRP 1.6 ESR 43 TSH 1.537 Anti dsDNA, Jo, ENA, Ro, La, Scl-70, SM-RNP negative RPR Negative HIV negative Chest CT: Peripheral, patchy areas of opacities with ?cystic changes vs. Honeycombing - solumedrol 80 q12 started 01/10/15   LINES / TUBES: PIV  CULTURES: NGTD - Blood cultures 2/17 and 2/18 RVP Pending 2/18 Sputum Culture Pending 2/18  ANTIBIOTICS: Levaquin 2/17 >>> CTX and Azithro x 1 dose 2/17   SUBJECTIVE:  Hacking cough productive of minimal rusty sputum / no increased wob on 3lpm   VITAL SIGNS: Temp:  [97.2 F (36.2 C)-102.4 F (39.1 C)] 98.4 F (36.9 C) (02/20 0455) Pulse Rate:  [89-113] 95 (02/20 0455) Resp:  [18-22] 18 (02/20 0455) BP: (101-113)/(63-78) 104/63 mmHg (02/20 0455) SpO2:  [95 %-99 %] 95 % (02/20 0455) Weight:  [143 lb 14.4 oz (65.273 kg)] 143 lb 14.4 oz (65.273 kg) (02/19 1940) HEMODYNAMICS:   VENTILATOR SETTINGS:   INTAKE / OUTPUT: Intake/Output      02/19 0701 - 02/20 0700 02/20 0701 - 02/21 0700   P.O. 360    Total Intake(mL/kg) 360 (5.5)    Urine (mL/kg/hr) 3100 (2)    Stool 0 (0)    Total Output 3100     Net -2740            PHYSICAL EXAMINATION: General:  WDWNBF with mild tachypnea Neuro:  Intact HEENT:  Sclera anicteric, conjunctiva pink, MMM, OP notable for ? Hematomas on hard/soft palate Neck: Trachea supple and midline, (-) LAN or JVD Cardiovascular:  RRR, NS1/S2, (-) MRG Lungs:  Coarse classic BV changes bilaterally  Abdomen:  S/NT/ND/(+)BS Musculoskeletal:  (-)  C/C/E Skin:  Platches of discolored skin on lateral arms bilaterally  LABS:  CBC  Recent Labs Lab 01/07/15 1307 01/09/15 0618 01/10/15 0620  WBC 5.0 5.4 4.9  HGB 12.7 11.0* 11.3*  HCT 38.4 33.8* 34.4*  PLT 229 236 238   Coag's No results for input(s): APTT, INR in the last 168 hours. BMET  Recent Labs Lab 01/07/15 1307 01/09/15 0618 01/10/15 0620  NA 135 135 139  K 3.5 3.3* 3.9  CL 101 106 111  CO2 23 26 22   BUN <5* <5* <5*  CREATININE 0.65 0.60 0.46*  GLUCOSE 115* 96 90   Electrolytes  Recent Labs Lab 01/07/15 1307 01/09/15 0618 01/10/15 0620  CALCIUM 8.9 7.6* 8.4   Sepsis Markers  Recent Labs Lab 01/07/15 1458 01/08/15 1705 01/08/15 1715 01/08/15 1939 01/10/15 0620  LATICACIDVEN 1.05  --  1.1 1.8  --   PROCALCITON  --  <0.10  --   --  <0.10   ABG No results for input(s): PHART, PCO2ART, PO2ART in the last 168 hours. Liver Enzymes  Recent Labs Lab 01/10/15 0620  AST 78*  ALT 34  ALKPHOS 73  BILITOT 0.5  ALBUMIN 2.6*   Cardiac Enzymes No results for input(s): TROPONINI, PROBNP in the last 168 hours. Glucose No results for input(s): GLUCAP in the last 168 hours.  Imaging No results found.  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 No obvious pattern on CT (UIP, NSIP, etc).   - P:   Start solumedrol 80 q12 01/10/15 and re- eval Monday am 2/22 re ? Fob needed  - check for Sickle cell dz/ recheck ana / anca     HEMATOLOGIC A: Anemia:  - Iron Panel  01/09/15 c/w fe def  P: D/c'd lovenox as may need fob with tbbx and low grade hempotysis suggests alv hem?      INFECTIOUS - PCT < 0.10 2/18 A: Possible multifocal pneumonia: Doubt this actually represents pneumonia but coverage with levaquin is ok for now. P:   ABx per ID      Discussed in detail all the  indications, usual  risks and alternatives  relative to the benefits with  patient who agrees to proceed with 48 h steroids then fob 2/22 if no clear response     Christinia Gully, MD Pulmonary and [place] [phone] After 5:30 PM or weekends, call [phone]

## 2015-01-10 NOTE — Progress Notes (Signed)
[facility] PROGRESS NOTE  Kayla [name] AVW:[id number] DOB: January 19, 1991 DOA: 01/07/2015 PCP: No PCP Per Patient Interim summary: 24 y.o. female with no significant past medical history comes in for sob, palpitations. She was found to have multifocal pneumonia. And possible ILD.  Assessment/Plan: 1. Acute respiratory failure probably secondary to auto immune process and  Community acquired pneumonia. Currently on IV levquin and steroids were started by pulmonologist.  She is requiring nasal canula oxygen and is on 2lit and sat are in low 90's.   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   Malar rash: Auto immune work up and outpatient follow up with rheumatology  Code Status: full code.  Family Communication: none at bedside, called aunt and left message.  Disposition Plan: pending further work up.    Consultants:  ID  Pulmonary.   Procedures:  none  Antibiotics:  IV levaquin (indicate start date, and stop date if known)  HPI/Subjective: She reports palpitations. Her breathing is the same. No chest pain.   Objective: Filed Vitals:   01/10/15 2032  BP: 118/72  Pulse: 97  Temp: 98.2 F (36.8 C)  Resp: 17    Intake/Output Summary (Last 24 hours) at 01/10/15 2153 Last data filed at 01/10/15 2032  Gross per 24 hour  Intake    480 ml  Output   4150 ml  Net  -3670 ml   Filed Weights   01/07/15 1926 01/08/15 2146 01/09/15 1940  Weight: 63.504 kg (140 lb) 64.311 kg (141 lb 12.5 oz) 65.273 kg (143 lb 14.4 oz)    Exam:   General:  Alert afebrile comfortable  Cardiovascular: s1s2, tachycardia  Respiratory: tachypnea, scattered rhonchi  Abdomen: soft non tender non distended. Bowel sounds heard  Musculoskeletal: no pedal edema.   Data Reviewed: Basic Metabolic Panel:  Recent Labs Lab 01/07/15 1307 01/09/15 0618 01/10/15 0620  NA 135 135 139  K 3.5 3.3* 3.9  CL 101 106 111  CO2 23 26 22   GLUCOSE 115* 96 90  BUN <5* <5*  <5*  CREATININE 0.65 0.60 0.46*  CALCIUM 8.9 7.6* 8.4   Liver Function Tests:  Recent Labs Lab 01/10/15 0620  AST 78*  ALT 34  ALKPHOS 73  BILITOT 0.5  PROT 6.3  ALBUMIN 2.6*   No results for input(s): LIPASE, AMYLASE in the last 168 hours. No results for input(s): AMMONIA in the last 168 hours. CBC:  Recent Labs Lab 01/07/15 1307 01/09/15 0618 01/10/15 0620  WBC 5.0 5.4 4.9  HGB 12.7 11.0* 11.3*  HCT 38.4 33.8* 34.4*  MCV 80.5 81.1 80.9  PLT 229 236 238   Cardiac Enzymes: No results for input(s): CKTOTAL, CKMB, CKMBINDEX, TROPONINI in the last 168 hours. BNP (last 3 results)  Recent Labs  01/07/15 1307  BNP 10.2    ProBNP (last 3 results) No results for input(s): PROBNP in the last 8760 hours.  CBG: No results for input(s): GLUCAP in the last 168 hours.  Recent Results (from the past 240 hour(s))  Culture, blood (routine x 2)     Status: None (Preliminary result)   Collection Time: 01/07/15  8:58 PM  Result Value Ref Range Status   Specimen Description BLOOD RIGHT ARM  Final   Special Requests BOTTLES DRAWN AEROBIC AND ANAEROBIC 10CC  Final   Culture   Final           BLOOD CULTURE RECEIVED NO GROWTH TO DATE CULTURE WILL BE HELD FOR 5 DAYS BEFORE ISSUING A FINAL NEGATIVE  REPORT Performed at [facility]    Report Status PENDING  Incomplete  Culture, blood (routine x 2)     Status: None (Preliminary result)   Collection Time: 01/07/15  9:03 PM  Result Value Ref Range Status   Specimen Description BLOOD RIGHT HAND  Final   Special Requests BOTTLES DRAWN AEROBIC ONLY 10CC  Final   Culture   Final           BLOOD CULTURE RECEIVED NO GROWTH TO DATE CULTURE WILL BE HELD FOR 5 DAYS BEFORE ISSUING A FINAL NEGATIVE REPORT Note: Culture results may be compromised due to an excessive volume of blood received in culture bottles. Performed at [facility]    Report Status PENDING  Incomplete  Culture, sputum-assessment     Status: None    Collection Time: 01/07/15  9:12 PM  Result Value Ref Range Status   Specimen Description SPUTUM  Final   Special Requests NONE  Final   Sputum evaluation   Final    THIS SPECIMEN IS ACCEPTABLE. RESPIRATORY CULTURE REPORT TO FOLLOW.   Report Status 01/08/2015 FINAL  Final  Culture, respiratory (NON-Expectorated)     Status: None   Collection Time: 01/07/15  9:12 PM  Result Value Ref Range Status   Specimen Description SPUTUM  Final   Special Requests NONE  Final   Gram Stain   Final    MODERATE WBC PRESENT, PREDOMINANTLY PMN FEW SQUAMOUS EPITHELIAL CELLS PRESENT FEW GRAM POSITIVE COCCI IN PAIRS IN CHAINS IN CLUSTERS FEW GRAM NEGATIVE RODS Performed at [facility]    Culture   Final    NORMAL OROPHARYNGEAL FLORA Performed at [facility]    Report Status 01/10/2015 FINAL  Final  Culture, blood (routine x 2)     Status: None (Preliminary result)   Collection Time: 01/08/15  5:05 PM  Result Value Ref Range Status   Specimen Description BLOOD RIGHT ARM  Final   Special Requests BOTTLES DRAWN AEROBIC ONLY 6CC  Final   Culture   Final           BLOOD CULTURE RECEIVED NO GROWTH TO DATE CULTURE WILL BE HELD FOR 5 DAYS BEFORE ISSUING A FINAL NEGATIVE REPORT Performed at [facility]    Report Status PENDING  Incomplete  Culture, blood (routine x 2)     Status: None (Preliminary result)   Collection Time: 01/08/15  5:15 PM  Result Value Ref Range Status   Specimen Description BLOOD RIGHT HAND  Final   Special Requests BOTTLES DRAWN AEROBIC ONLY 10CC  Final   Culture   Final           BLOOD CULTURE RECEIVED NO GROWTH TO DATE CULTURE WILL BE HELD FOR 5 DAYS BEFORE ISSUING A FINAL NEGATIVE REPORT Performed at [facility]    Report Status PENDING  Incomplete  Surgical pcr screen     Status: Abnormal   Collection Time: 01/10/15  5:15 AM  Result Value Ref Range Status   MRSA, PCR POSITIVE (A) NEGATIVE Final   Staphylococcus aureus POSITIVE (A) NEGATIVE  Final    Comment:        The Xpert SA Assay (FDA approved for NASAL specimens in patients over 32 years of age), is one component of a comprehensive surveillance program.  Test performance has been validated by [facility] for patients greater than or equal to 73 year old. It is not intended to diagnose infection nor to guide or monitor treatment.  Studies: No results found.  Scheduled Meds: . benzonatate  100 mg Oral Q8H  . [START ON 01/11/2015] Chlorhexidine Gluconate Cloth  6 each Topical Q0600  . dextromethorphan-guaiFENesin  2 tablet Oral BID  . feeding supplement (ENSURE COMPLETE)  237 mL Oral BID BM  . ibuprofen  400 mg Oral TID  . levofloxacin (LEVAQUIN) IV  750 mg Intravenous Q24H  . methylPREDNISolone (SOLU-MEDROL) injection  80 mg Intravenous Q12H  . mupirocin ointment  1 application Nasal BID  . pantoprazole  40 mg Oral BID AC   Continuous Infusions:   Active Problems:   Pulmonary infiltrates   Protein-calorie malnutrition, severe   Unintentional weight loss   Malar rash   Alopecia   Hemoptysis   Myalgia   Arthralgia   Fatigue   Chest pain   Community acquired pneumonia    Time spent: 25 min    [facility]  [facility] Pager [phone]  If 7PM-7AM, please contact night-coverage at [email or web address], password [facility] 01/10/2015, 9:53 PM  LOS: 3 days

## 2015-01-11 DIAGNOSIS — R509 Fever, unspecified: Secondary | ICD-10-CM | POA: Insufficient documentation

## 2015-01-11 DIAGNOSIS — R071 Chest pain on breathing: Secondary | ICD-10-CM

## 2015-01-11 LAB — RESPIRATORY VIRUS PANEL
Adenovirus: NEGATIVE
Influenza A: NEGATIVE
Influenza B: NEGATIVE
METAPNEUMOVIRUS: NEGATIVE
PARAINFLUENZA 2 A: NEGATIVE
PARAINFLUENZA 3 A: NEGATIVE
Parainfluenza 1: NEGATIVE
RESPIRATORY SYNCYTIAL VIRUS A: NEGATIVE
RESPIRATORY SYNCYTIAL VIRUS B: NEGATIVE
Rhinovirus: NEGATIVE

## 2015-01-11 LAB — HEPATITIS B SURFACE ANTIBODY,QUALITATIVE: Hep B S Ab: REACTIVE

## 2015-01-11 LAB — C4 COMPLEMENT: COMPLEMENT C4, BODY FLUID: 27 mg/dL (ref 14–44)

## 2015-01-11 LAB — C3 COMPLEMENT: C3 Complement: 115 mg/dL (ref 82–167)

## 2015-01-11 NOTE — Progress Notes (Signed)
INFECTIOUS DISEASE PROGRESS NOTE  ID: Kayla [name] is a 24 y.o. female with  Active Problems:   Pulmonary infiltrates   Protein-calorie malnutrition, severe   Unintentional weight loss   Malar rash   Alopecia   Hemoptysis   Myalgia   Arthralgia   Fatigue   Chest pain   Community acquired pneumonia  Subjective: Temps better.  Per pt was hypoxic off O2 Muscle/joint aches better  Abtx:  Anti-infectives    Start     Dose/Rate Route Frequency Ordered Stop   01/08/15 0600  levofloxacin (LEVAQUIN) IVPB 750 mg     750 mg 100 mL/hr over 90 Minutes Intravenous Every 24 hours 01/07/15 1900     01/07/15 1645  cefTRIAXone (ROCEPHIN) 1 g in dextrose 5 % 50 mL IVPB     1 g 100 mL/hr over 30 Minutes Intravenous  Once 01/07/15 1636 01/07/15 1720   01/07/15 1645  azithromycin (ZITHROMAX) 500 mg in dextrose 5 % 250 mL IVPB     500 mg 250 mL/hr over 60 Minutes Intravenous  Once 01/07/15 1636 01/07/15 1818      Medications:  Scheduled: . benzonatate  100 mg Oral Q8H  . Chlorhexidine Gluconate Cloth  6 each Topical Q0600  . dextromethorphan-guaiFENesin  2 tablet Oral BID  . feeding supplement (ENSURE COMPLETE)  237 mL Oral BID BM  . levofloxacin (LEVAQUIN) IV  750 mg Intravenous Q24H  . methylPREDNISolone (SOLU-MEDROL) injection  80 mg Intravenous Q12H  . mupirocin ointment  1 application Nasal BID  . pantoprazole  40 mg Oral BID AC    Objective: Vital signs in last 24 hours: Temp:  [97.4 F (36.3 C)-98.2 F (36.8 C)] 97.4 F (36.3 C) (02/21 0933) Pulse Rate:  [74-97] 83 (02/21 0933) Resp:  [17-18] 18 (02/21 0933) BP: (111-118)/(72-95) 116/78 mmHg (02/21 0933) SpO2:  [95 %-98 %] 96 % (02/21 0933) Weight:  [63.504 kg (140 lb)] 63.504 kg (140 lb) (02/21 0057)   General appearance: alert, cooperative and no distress Resp: rhonchi bilaterally Cardio: regular rate and rhythm GI: normal findings: bowel sounds normal and soft, non-tender  Lab Results  Recent Labs  01/09/15 0618 01/10/15 0620  WBC 5.4 4.9  HGB 11.0* 11.3*  HCT 33.8* 34.4*  NA 135 139  K 3.3* 3.9  CL 106 111  CO2 26 22  BUN <5* <5*  CREATININE 0.60 0.46*   Liver Panel  Recent Labs  01/10/15 0620  PROT 6.3  ALBUMIN 2.6*  AST 78*  ALT 34  ALKPHOS 73  BILITOT 0.5   Sedimentation Rate No results for input(s): ESRSEDRATE in the last 72 hours. C-Reactive Protein No results for input(s): CRP in the last 72 hours.  Microbiology: Recent Results (from the past 240 hour(s))  Culture, blood (routine x 2)     Status: None (Preliminary result)   Collection Time: 01/07/15  8:58 PM  Result Value Ref Range Status   Specimen Description BLOOD RIGHT ARM  Final   Special Requests BOTTLES DRAWN AEROBIC AND ANAEROBIC 10CC  Final   Culture   Final           BLOOD CULTURE RECEIVED NO GROWTH TO DATE CULTURE WILL BE HELD FOR 5 DAYS BEFORE ISSUING A FINAL NEGATIVE REPORT Performed at [facility]    Report Status PENDING  Incomplete  Culture, blood (routine x 2)     Status: None (Preliminary result)   Collection Time: 01/07/15  9:03 PM  Result Value Ref Range Status  Specimen Description BLOOD RIGHT HAND  Final   Special Requests BOTTLES DRAWN AEROBIC ONLY 10CC  Final   Culture   Final           BLOOD CULTURE RECEIVED NO GROWTH TO DATE CULTURE WILL BE HELD FOR 5 DAYS BEFORE ISSUING A FINAL NEGATIVE REPORT Note: Culture results may be compromised due to an excessive volume of blood received in culture bottles. Performed at [facility]    Report Status PENDING  Incomplete  Culture, sputum-assessment     Status: None   Collection Time: 01/07/15  9:12 PM  Result Value Ref Range Status   Specimen Description SPUTUM  Final   Special Requests NONE  Final   Sputum evaluation   Final    THIS SPECIMEN IS ACCEPTABLE. RESPIRATORY CULTURE REPORT TO FOLLOW.   Report Status 01/08/2015 FINAL  Final  Culture, respiratory (NON-Expectorated)     Status: None   Collection Time:  01/07/15  9:12 PM  Result Value Ref Range Status   Specimen Description SPUTUM  Final   Special Requests NONE  Final   Gram Stain   Final    MODERATE WBC PRESENT, PREDOMINANTLY PMN FEW SQUAMOUS EPITHELIAL CELLS PRESENT FEW GRAM POSITIVE COCCI IN PAIRS IN CHAINS IN CLUSTERS FEW GRAM NEGATIVE RODS Performed at [facility]    Culture   Final    NORMAL OROPHARYNGEAL FLORA Performed at [facility]    Report Status 01/10/2015 FINAL  Final  Culture, blood (routine x 2)     Status: None (Preliminary result)   Collection Time: 01/08/15  5:05 PM  Result Value Ref Range Status   Specimen Description BLOOD RIGHT ARM  Final   Special Requests BOTTLES DRAWN AEROBIC ONLY 6CC  Final   Culture   Final           BLOOD CULTURE RECEIVED NO GROWTH TO DATE CULTURE WILL BE HELD FOR 5 DAYS BEFORE ISSUING A FINAL NEGATIVE REPORT Performed at [facility]    Report Status PENDING  Incomplete  Culture, blood (routine x 2)     Status: None (Preliminary result)   Collection Time: 01/08/15  5:15 PM  Result Value Ref Range Status   Specimen Description BLOOD RIGHT HAND  Final   Special Requests BOTTLES DRAWN AEROBIC ONLY 10CC  Final   Culture   Final           BLOOD CULTURE RECEIVED NO GROWTH TO DATE CULTURE WILL BE HELD FOR 5 DAYS BEFORE ISSUING A FINAL NEGATIVE REPORT Performed at [facility]    Report Status PENDING  Incomplete  Surgical pcr screen     Status: Abnormal   Collection Time: 01/10/15  5:15 AM  Result Value Ref Range Status   MRSA, PCR POSITIVE (A) NEGATIVE Final   Staphylococcus aureus POSITIVE (A) NEGATIVE Final    Comment:        The Xpert SA Assay (FDA approved for NASAL specimens in patients over 76 years of age), is one component of a comprehensive surveillance program.  Test performance has been validated by [facility] for patients greater than or equal to 17 year old. It is not intended to diagnose infection nor to guide or monitor  treatment.     Studies/Results: No results found.   Assessment/Plan: Fever  Pulmonary Infiltrates Suspected autoimmune disease  Total days of antibiotics 5 (4 levaquin)  Await bronch She has improved on steroids.  Continue anbx for now, aim for short course  Johny Sax Infectious Diseases (pager) [phone] [email or web address] 01/11/2015, 10:58 AM  LOS: 4 days

## 2015-01-11 NOTE — Progress Notes (Signed)
[facility] PROGRESS NOTE  Kayla [name] WUJ:[id number] DOB: [birth year] DOA: 01/07/2015 PCP: No PCP Per Patient Interim summary: 24 y.o. female with no significant past medical history comes in for sob, palpitations. She was found to have multifocal pneumonia. And possible ILD.  Assessment/Plan: 1. Acute respiratory failure probably secondary to auto immune process and  Community acquired pneumonia. Currently on IV levquin  Day 4, and steroids was started by pulmonologist.  She is requiring nasal canula oxygen and is on 2lit and sat are in low 90's. She reports her breathing and palpitations have  Much improved. She is cheerful. Scheduled for FO bronchoscopy in am.   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   Malar rash: Auto immune work up and outpatient follow up with rheumatology   Code Status: full code.  Family Communication: none at bedside, called aunt and left message.  Disposition Plan: pending further work up.    Consultants:  ID  Pulmonary.   Procedures:  none  Antibiotics:  IV levaquin (indicate start date, and stop date if known)  HPI/Subjective: No chest pain, sob improved. Sitting in chair , cheerful.   Objective: Filed Vitals:   01/11/15 0933  BP: 116/78  Pulse: 83  Temp: 97.4 F (36.3 C)  Resp: 18    Intake/Output Summary (Last 24 hours) at 01/11/15 1931 Last data filed at 01/11/15 1300  Gross per 24 hour  Intake   1440 ml  Output   2077 ml  Net   -637 ml   Filed Weights   01/08/15 2146 01/09/15 1940 01/11/15 0057  Weight: 64.311 kg (141 lb 12.5 oz) 65.273 kg (143 lb 14.4 oz) 63.504 kg (140 lb)    Exam:   General:  Alert afebrile comfortable  Cardiovascular: s1s2, tachycardia improved.   Respiratory: tachypnea improved, clear to auscultation,  no wheezing.   Abdomen: soft non tender non distended. Bowel sounds heard  Musculoskeletal: no pedal edema.   Data Reviewed: Basic Metabolic Panel:  Recent  Labs Lab 01/07/15 1307 01/09/15 0618 01/10/15 0620  NA 135 135 139  K 3.5 3.3* 3.9  CL 101 106 111  CO2 GLUCOSE 115* 96 90  BUN <5* <5* <5*  CREATININE 0.65 0.60 0.46*  CALCIUM 8.9 7.6* 8.4   Liver Function Tests:  Recent Labs Lab 01/10/15 0620  AST 78*  ALT 34  ALKPHOS 73  BILITOT 0.5  PROT 6.3  ALBUMIN 2.6*   No results for input(s): LIPASE, AMYLASE in the last 168 hours. No results for input(s): AMMONIA in the last 168 hours. CBC:  Recent Labs Lab 01/07/15 1307 01/09/15 0618 01/10/15 0620  WBC 5.0 5.4 4.9  HGB 12.7 11.0* 11.3*  HCT 38.4 33.8* 34.4*  MCV 80.5 81.1 80.9  PLT 229 236 238   Cardiac Enzymes: No results for input(s): CKTOTAL, CKMB, CKMBINDEX, TROPONINI in the last 168 hours. BNP (last 3 results)  Recent Labs  01/07/15 1307  BNP 10.2    ProBNP (last 3 results) No results for input(s): PROBNP in the last 8760 hours.  CBG: No results for input(s): GLUCAP in the last 168 hours.  Recent Results (from the past 240 hour(s))  Culture, blood (routine x 2)     Status: None (Preliminary result)   Collection Time: 01/07/15  8:58 PM  Result Value Ref Range Status   Specimen Description BLOOD RIGHT ARM  Final   Special Requests BOTTLES DRAWN AEROBIC AND ANAEROBIC 10CC  Final   Culture  Final           BLOOD CULTURE RECEIVED NO GROWTH TO DATE CULTURE WILL BE HELD FOR 5 DAYS BEFORE ISSUING A FINAL NEGATIVE REPORT Performed at [facility]    Report Status PENDING  Incomplete  Culture, blood (routine x 2)     Status: None (Preliminary result)   Collection Time: 01/07/15  9:03 PM  Result Value Ref Range Status   Specimen Description BLOOD RIGHT HAND  Final   Special Requests BOTTLES DRAWN AEROBIC ONLY 10CC  Final   Culture   Final           BLOOD CULTURE RECEIVED NO GROWTH TO DATE CULTURE WILL BE HELD FOR 5 DAYS BEFORE ISSUING A FINAL NEGATIVE REPORT Note: Culture results may be compromised due to an excessive volume of blood  received in culture bottles. Performed at [facility]    Report Status PENDING  Incomplete  Culture, sputum-assessment     Status: None   Collection Time: 01/07/15  9:12 PM  Result Value Ref Range Status   Specimen Description SPUTUM  Final   Special Requests NONE  Final   Sputum evaluation   Final    THIS SPECIMEN IS ACCEPTABLE. RESPIRATORY CULTURE REPORT TO FOLLOW.   Report Status 01/08/2015 FINAL  Final  Culture, respiratory (NON-Expectorated)     Status: None   Collection Time: 01/07/15  9:12 PM  Result Value Ref Range Status   Specimen Description SPUTUM  Final   Special Requests NONE  Final   Gram Stain   Final    MODERATE WBC PRESENT, PREDOMINANTLY PMN FEW SQUAMOUS EPITHELIAL CELLS PRESENT FEW GRAM POSITIVE COCCI IN PAIRS IN CHAINS IN CLUSTERS FEW GRAM NEGATIVE RODS Performed at [facility]    Culture   Final    NORMAL OROPHARYNGEAL FLORA Performed at [facility]    Report Status 01/10/2015 FINAL  Final  Culture, blood (routine x 2)     Status: None (Preliminary result)   Collection Time: 01/08/15  5:05 PM  Result Value Ref Range Status   Specimen Description BLOOD RIGHT ARM  Final   Special Requests BOTTLES DRAWN AEROBIC ONLY 6CC  Final   Culture   Final           BLOOD CULTURE RECEIVED NO GROWTH TO DATE CULTURE WILL BE HELD FOR 5 DAYS BEFORE ISSUING A FINAL NEGATIVE REPORT Performed at [facility]    Report Status PENDING  Incomplete  Culture, blood (routine x 2)     Status: None (Preliminary result)   Collection Time: 01/08/15  5:15 PM  Result Value Ref Range Status   Specimen Description BLOOD RIGHT HAND  Final   Special Requests BOTTLES DRAWN AEROBIC ONLY 10CC  Final   Culture   Final           BLOOD CULTURE RECEIVED NO GROWTH TO DATE CULTURE WILL BE HELD FOR 5 DAYS BEFORE ISSUING A FINAL NEGATIVE REPORT Performed at [facility]    Report Status PENDING  Incomplete  Respiratory virus panel (routine influenza)      Status: None   Collection Time: 01/08/15  5:42 PM  Result Value Ref Range Status   Source - RVPAN NASAL SWAB  Corrected   Respiratory Syncytial Virus A Negative Negative Final   Respiratory Syncytial Virus B Negative Negative Final   Influenza A Negative Negative Final   Influenza B Negative Negative Final   Parainfluenza 1 Negative Negative Final   Parainfluenza 2  Negative Negative Final   Parainfluenza 3 Negative Negative Final   Metapneumovirus Negative Negative Final   Rhinovirus Negative Negative Final   Adenovirus Negative Negative Final    Comment: (NOTE) Performed At: [facility] [address] [place], [place] [id number] Mila Homer MD WU:[id number]   Surgical pcr screen     Status: Abnormal   Collection Time: 01/10/15  5:15 AM  Result Value Ref Range Status   MRSA, PCR POSITIVE (A) NEGATIVE Final   Staphylococcus aureus POSITIVE (A) NEGATIVE Final    Comment:        The Xpert SA Assay (FDA approved for NASAL specimens in patients over 19 years of age), is one component of a comprehensive surveillance program.  Test performance has been validated by [facility] for patients greater than or equal to 25 year old. It is not intended to diagnose infection nor to guide or monitor treatment.      Studies: No results found.  Scheduled Meds: . benzonatate  100 mg Oral Q8H  . Chlorhexidine Gluconate Cloth  6 each Topical Q0600  . dextromethorphan-guaiFENesin  2 tablet Oral BID  . feeding supplement (ENSURE COMPLETE)  237 mL Oral BID BM  . levofloxacin (LEVAQUIN) IV  750 mg Intravenous Q24H  . methylPREDNISolone (SOLU-MEDROL) injection  80 mg Intravenous Q12H  . mupirocin ointment  1 application Nasal BID  . pantoprazole  40 mg Oral BID AC   Continuous Infusions:   Active Problems:   Pulmonary infiltrates   Protein-calorie malnutrition, severe   Unintentional weight loss   Malar rash   Alopecia   Hemoptysis   Myalgia   Arthralgia    Fatigue   Chest pain   Community acquired pneumonia   Fever, unknown origin    Time spent: 25 min    Ramces Shomaker  [facility] Pager [phone]  If 7PM-7AM, please contact night-coverage at [email or web address], password [facility] 01/11/2015, 7:31 PM  LOS: 4 days

## 2015-01-11 NOTE — Progress Notes (Signed)
PULMONARY  / CRITICAL CARE MEDICINE CONSULTATION   Name: Kayla [name] MRN: [id number] DOB: 1991-03-15    ADMISSION DATE:  01/07/2015 CONSULTATION DATE: 01/09/2015  REQUESTING CLINICIAN: Dr. Algis Liming PRIMARY SERVICE: TR[facility]  CHIEF COMPLAINT:  FUO/pulmonary infiltrates present since at least 09/18/14   BRIEF PATIENT DESCRIPTION: 68 F with fevers, pulmonary opacities, and systemic complaints concerning for autoimmune disease.  SIGNIFICANT EVENTS / STUDIES:  CRP 1.6 ESR 43 TSH 1.537 Anti dsDNA, Jo, ENA, Ro, La, Scl-70, SM-RNP negative RPR Negative HIV negative Chest CT: Peripheral, patchy areas of opacities with ?cystic changes vs. Honeycombing - solumedrol 80 q12 started 01/10/15   LINES / TUBES: PIV  CULTURES: MRSA screen 2/20 > POS   Blood cultures 2/17 and 2/18 >>> RVP   2/18 >>> Sputum Culture 2/17 > mod wbc's predom pmns/ nl flora   ANTIBIOTICS: Levaquin 2/17 >>> CTX and Azithro x 1 dose 2/17   SUBJECTIVE:  Much more comfortable today on 2lpm , less pain with cough , min slt discolored sputum   VITAL SIGNS: Temp:  [97.7 F (36.5 C)-98.6 F (37 C)] 97.7 F (36.5 C) (02/21 0451) Pulse Rate:  [74-105] 74 (02/21 0451) Resp:  [17-18] 17 (02/21 0451) BP: (110-118)/(72-95) 111/95 mmHg (02/21 0451) SpO2:  [95 %-98 %] 95 % (02/21 0451) Weight:  [140 lb (63.504 kg)] 140 lb (63.504 kg) (02/21 0057) HEMODYNAMICS:   VENTILATOR SETTINGS:   INTAKE / OUTPUT: Intake/Output      02/20 0701 - 02/21 0700 02/21 0701 - 02/22 0700   P.O. 720    IV Piggyback 600    Total Intake(mL/kg) 1320 (20.8)    Urine (mL/kg/hr) 4475 (2.9)    Stool     Total Output 4475     Net -3155            PHYSICAL EXAMINATION: General:  WDWNBF with mild tachypnea Neuro:  Intact HEENT:  Sclera anicteric, conjunctiva pink, MMM, OP notable for ? Hematomas on hard/soft palate Neck: Trachea supple and midline, (-) LAN or JVD Cardiovascular:  RRR, NS1/S2, (-) MRG Lungs:  Coarse classic BV changes  bilaterally  Abdomen:  S/NT/ND/(+)BS Musculoskeletal:  (-) C/C/E Skin:  Platches of discolored skin on lateral arms bilaterally and classic malar /butterfly rash   LABS:  CBC  Recent Labs Lab 01/07/15 1307 01/09/15 0618 01/10/15 0620  WBC 5.0 5.4 4.9  HGB 12.7 11.0* 11.3*  HCT 38.4 33.8* 34.4*  PLT 229 236 238   Coag's No results for input(s): APTT, INR in the last 168 hours. BMET  Recent Labs Lab 01/07/15 1307 01/09/15 0618 01/10/15 0620  NA 135 135 139  K 3.5 3.3* 3.9  CL 101 106 111  CO2 _0 BUN <5* <5* <5*  CREATININE 0.65 0.60 0.46*  GLUCOSE 115* 96 90   Electrolytes  Recent Labs Lab 01/07/15 1307 01/09/15 0618 01/10/15 0620  CALCIUM 8.9 7.6* 8.4   Sepsis Markers  Recent Labs Lab 01/07/15 1458 01/08/15 1705 01/08/15 1715 01/08/15 1939 01/10/15 0620  LATICACIDVEN 1.05  --  1.1 1.8  --   PROCALCITON  --  <0.10  --   --  <0.10   ABG No results for input(s): PHART, PCO2ART, PO2ART in the last 168 hours. Liver Enzymes  Recent Labs Lab 01/10/15 0620  AST 78*  ALT 34  ALKPHOS 73  BILITOT 0.5  ALBUMIN 2.6*   Cardiac Enzymes No results for input(s): TROPONINI, PROBNP in the last 168 hours. Glucose No results for input(s): GLUCAP in the  last 168 hours.  Imaging No results found.  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 No obvious pattern on CT (UIP, NSIP, etc).   -   Sickle cell dz/ recheck ana / anca all ordered 2/20  P:    solumedrol 80 q12 01/10/15 started and note very abrupt defervescence so  re- eval Monday am 2/22 with cxr  re ? Fob needed so will keep npo p 8 am in case ID feels it's needed     HEMATOLOGIC A: Anemia:  - Iron Panel  01/09/15 c/w fe def  P: D/c'd lovenox as may need fob with tbbx and low grade hempotysis suggests alv hem?      INFECTIOUS - PCT < 0.10 2/18 A: Possible multifocal pneumonia: Doubt this  actually represents pneumonia but coverage with levaquin is ok for now. P:   ABx per ID      Discussed in detail all the  indications, usual  risks and alternatives  relative to the benefits with patient who agrees to proceed with 48 h steroids then fob 2/22 if no clear response     Michael Wert, MD Pulmonary and Critical Care Medicine [facility] Cell [phone] After 5:30 PM or weekends, call [phone]       

## 2015-01-12 ENCOUNTER — Inpatient Hospital Stay (HOSPITAL_COMMUNITY): Payer: No Typology Code available for payment source

## 2015-01-12 ENCOUNTER — Encounter (HOSPITAL_COMMUNITY)
Admission: EM | Disposition: A | Discharge status: Home / Self Care | Payer: Self-pay | Source: Home / Self Care | Attending: Internal Medicine

## 2015-01-12 DIAGNOSIS — R042 Hemoptysis: Secondary | ICD-10-CM

## 2015-01-12 DIAGNOSIS — R0609 Other forms of dyspnea: Secondary | ICD-10-CM

## 2015-01-12 HISTORY — PX: VIDEO BRONCHOSCOPY: SHX5072

## 2015-01-12 LAB — SICKLE CELL SCREEN: Sickle Cell Screen: NEGATIVE

## 2015-01-12 LAB — CBC
HCT: 36.2 % (ref 36.0–46.0)
Hemoglobin: 12 g/dL (ref 12.0–15.0)
MCH: 26.6 pg (ref 26.0–34.0)
MCHC: 33.1 g/dL (ref 30.0–36.0)
MCV: 80.3 fL (ref 78.0–100.0)
Platelets: 383 10*3/uL (ref 150–400)
RBC: 4.51 MIL/uL (ref 3.87–5.11)
RDW: 13.7 % (ref 11.5–15.5)
WBC: 8 10*3/uL (ref 4.0–10.5)

## 2015-01-12 LAB — BASIC METABOLIC PANEL
Anion gap: 7 (ref 5–15)
BUN: 10 mg/dL (ref 6–23)
CALCIUM: 8.6 mg/dL (ref 8.4–10.5)
CO2: 22 mmol/L (ref 19–32)
CREATININE: 0.54 mg/dL (ref 0.50–1.10)
Chloride: 108 mmol/L (ref 96–112)
Glucose, Bld: 138 mg/dL — ABNORMAL HIGH (ref 70–99)
Potassium: 3.6 mmol/L (ref 3.5–5.1)
SODIUM: 137 mmol/L (ref 135–145)

## 2015-01-12 LAB — COMPLEMENT, TOTAL: COMPL TOTAL (CH50): 59 U/mL (ref 42–60)

## 2015-01-12 LAB — PROCALCITONIN: Procalcitonin: <0.1 ng/mL

## 2015-01-12 LAB — GLUCOSE, CAPILLARY: Glucose-Capillary: 111 mg/dL — ABNORMAL HIGH (ref 70–99)

## 2015-01-12 SURGERY — BRONCHOSCOPY, WITH FLUOROSCOPY
Anesthesia: Moderate Sedation | Laterality: Bilateral

## 2015-01-12 MED ORDER — LIDOCAINE HCL 2 % EX GEL
CUTANEOUS | Status: DC | PRN
  Administered 2015-01-12: 1

## 2015-01-12 MED ORDER — FENTANYL CITRATE 0.05 MG/ML IJ SOLN
INTRAMUSCULAR | Status: AC
  Filled 2015-01-12: qty 4

## 2015-01-12 MED ORDER — PHENYLEPHRINE HCL 0.25 % NA SOLN
NASAL | Status: DC | PRN
  Administered 2015-01-12: 2 via NASAL

## 2015-01-12 MED ORDER — MIDAZOLAM HCL 5 MG/ML IJ SOLN
INTRAMUSCULAR | Status: DC | PRN
  Administered 2015-01-12: 2 mg via INTRAVENOUS
  Administered 2015-01-12: 3 mg via INTRAVENOUS
  Administered 2015-01-12 (×2): 2 mg via INTRAVENOUS

## 2015-01-12 MED ORDER — SODIUM CHLORIDE 0.9 % IV SOLN
INTRAVENOUS | Status: DC
  Administered 2015-01-12: 14:00:00 via INTRAVENOUS

## 2015-01-12 MED ORDER — LIDOCAINE HCL (PF) 1 % IJ SOLN
INTRAMUSCULAR | Status: DC | PRN
  Administered 2015-01-12: 6 mL

## 2015-01-12 MED ORDER — LIDOCAINE HCL 2 % EX GEL
1.0000 "application " | Freq: Once | CUTANEOUS | Status: DC
  Filled 2015-01-12: qty 5

## 2015-01-12 MED ORDER — PHENYLEPHRINE HCL 0.25 % NA SOLN
1.0000 | Freq: Four times a day (QID) | NASAL | Status: DC | PRN
  Filled 2015-01-12: qty 15

## 2015-01-12 MED ORDER — FENTANYL CITRATE 0.05 MG/ML IJ SOLN
INTRAMUSCULAR | Status: DC | PRN
  Administered 2015-01-12 (×4): 50 ug via INTRAVENOUS

## 2015-01-12 MED ORDER — MIDAZOLAM HCL 5 MG/ML IJ SOLN
INTRAMUSCULAR | Status: AC
  Filled 2015-01-12: qty 2

## 2015-01-12 NOTE — Progress Notes (Signed)
Video Bronchoscopy Performed  Bronchial Washing Intervention Done Biopsy Intervention done  Pt tolerated well  Levy Pupaobert Marrie Chandra, MD, PhD 01/12/2015, 5:26 PM [place] [facility] [phone] or if no answer [phone]

## 2015-01-12 NOTE — Progress Notes (Signed)
[facility] PROGRESS NOTE  Kayla [name] ZOX:[id number] DOB: [birth year] DOA: 01/07/2015 PCP: No PCP Per Patient Interim summary: 24 y.o. female with no significant past medical history comes in for sob, palpitations. She was found to have multifocal pneumonia a nd ILD. ID and pulmonology consulted. She underwent FOB on 2/22 and biopsies were taken from RML. She is currently on IV levaquin and IV steroids were started by pulmonology on 2/20. Her breathing is better.  Assessment/Plan: 1. Acute respiratory failure probably secondary to auto immune process and  Community acquired pneumonia. Currently on IV levquin and steroids was started by pulmonologist.  She is requiring nasal canula oxygen and is on 2lit and sat are in low 90's. She reports her breathing and palpitations have  Much improved. She is cheerful. Underwent bronchoscopy today and biopsies and bronchial washings were taken and sent for analysis, including BAL afb and fungal cultures.   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 Sickle cell screen negative.   Malar rash: Auto immune work up and outpatient follow up with rheumatology improving with steroids. Her joint pains have also improved.   Code Status: full code.  Family Communication: discussed in detail with aunt at bedside Disposition Plan: pending further work up. Possibly home in 1 to 2 days.    Consultants:  ID  Pulmonary.   Procedures:  none  Antibiotics:  IV levaquin  Day 5.  HPI/Subjective: No chest pain, sob improved. Sitting in chair , cheerful. Currently on [place] oxygen. Pulse ox showing 100% Wants to know when she can go home.   Objective: Filed Vitals:   01/12/15 1725  BP: 130/88  Pulse: 95  Temp: 98.2 F (36.8 C)  Resp: 19    Intake/Output Summary (Last 24 hours) at 01/12/15 2100 Last data filed at 01/12/15 1056  Gross per 24 hour  Intake    270 ml  Output   1450 ml  Net  -1180 ml   Filed Weights   01/08/15 2146 01/09/15 1940  01/11/15 0057  Weight: 64.311 kg (141 lb 12.5 oz) 65.273 kg (143 lb 14.4 oz) 63.504 kg (140 lb)    Exam:   General:  Alert afebrile comfortable, not in any distress.   Cardiovascular: s1s2, tachycardia improved.   Respiratory: tachypnea improved, clear to auscultation,  no wheezing or rhonchi.    Abdomen: soft non tender non distended. Bowel sounds heard, no sings of peritonitis.   Musculoskeletal: no pedal edema, cyanosis or clubbing.   Data Reviewed: Basic Metabolic Panel:  Recent Labs Lab 01/07/15 1307 01/09/15 0618 01/10/15 0620 01/12/15 0650  NA 135 135 139 137  K 3.5 3.3* 3.9 3.6  CL 101 106 111 108  CO2 23 26 22 22   GLUCOSE 115* 96 90 138*  BUN <5* <5* <5* 10  CREATININE 0.65 0.60 0.46* 0.54  CALCIUM 8.9 7.6* 8.4 8.6   Liver Function Tests:  Recent Labs Lab 01/10/15 0620  AST 78*  ALT 34  ALKPHOS 73  BILITOT 0.5  PROT 6.3  ALBUMIN 2.6*   No results for input(s): LIPASE, AMYLASE in the last 168 hours. No results for input(s): AMMONIA in the last 168 hours. CBC:  Recent Labs Lab 01/07/15 1307 01/09/15 0618 01/10/15 0620 01/12/15 0650  WBC 5.0 5.4 4.9 8.0  HGB 12.7 11.0* 11.3* 12.0  HCT 38.4 33.8* 34.4* 36.2  MCV 80.5 81.1 80.9 80.3  PLT 229 236 238 383   Cardiac Enzymes: No results for input(s): CKTOTAL, CKMB, CKMBINDEX, TROPONINI in the last 168 hours.  BNP (last 3 results)  Recent Labs  01/07/15 1307  BNP 10.2    ProBNP (last 3 results) No results for input(s): PROBNP in the last 8760 hours.  CBG:  Recent Labs Lab 01/09/15 1949  GLUCAP 111*    Recent Results (from the past 240 hour(s))  Culture, blood (routine x 2)     Status: None (Preliminary result)   Collection Time: 01/07/15  8:58 PM  Result Value Ref Range Status   Specimen Description BLOOD RIGHT ARM  Final   Special Requests BOTTLES DRAWN AEROBIC AND ANAEROBIC 10CC  Final   Culture   Final           BLOOD CULTURE RECEIVED NO GROWTH TO DATE CULTURE WILL BE HELD  FOR 5 DAYS BEFORE ISSUING A FINAL NEGATIVE REPORT Performed at [facility]    Report Status PENDING  Incomplete  Culture, blood (routine x 2)     Status: None (Preliminary result)   Collection Time: 01/07/15  9:03 PM  Result Value Ref Range Status   Specimen Description BLOOD RIGHT HAND  Final   Special Requests BOTTLES DRAWN AEROBIC ONLY 10CC  Final   Culture   Final           BLOOD CULTURE RECEIVED NO GROWTH TO DATE CULTURE WILL BE HELD FOR 5 DAYS BEFORE ISSUING A FINAL NEGATIVE REPORT Note: Culture results may be compromised due to an excessive volume of blood received in culture bottles. Performed at [facility]    Report Status PENDING  Incomplete  Culture, sputum-assessment     Status: None   Collection Time: 01/07/15  9:12 PM  Result Value Ref Range Status   Specimen Description SPUTUM  Final   Special Requests NONE  Final   Sputum evaluation   Final    THIS SPECIMEN IS ACCEPTABLE. RESPIRATORY CULTURE REPORT TO FOLLOW.   Report Status 01/08/2015 FINAL  Final  Culture, respiratory (NON-Expectorated)     Status: None   Collection Time: 01/07/15  9:12 PM  Result Value Ref Range Status   Specimen Description SPUTUM  Final   Special Requests NONE  Final   Gram Stain   Final    MODERATE WBC PRESENT, PREDOMINANTLY PMN FEW SQUAMOUS EPITHELIAL CELLS PRESENT FEW GRAM POSITIVE COCCI IN PAIRS IN CHAINS IN CLUSTERS FEW GRAM NEGATIVE RODS Performed at [facility]    Culture   Final    NORMAL OROPHARYNGEAL FLORA Performed at [facility]    Report Status 01/10/2015 FINAL  Final  Culture, blood (routine x 2)     Status: None (Preliminary result)   Collection Time: 01/08/15  5:05 PM  Result Value Ref Range Status   Specimen Description BLOOD RIGHT ARM  Final   Special Requests BOTTLES DRAWN AEROBIC ONLY 6CC  Final   Culture   Final           BLOOD CULTURE RECEIVED NO GROWTH TO DATE CULTURE WILL BE HELD FOR 5 DAYS BEFORE ISSUING A FINAL NEGATIVE  REPORT Performed at [facility]    Report Status PENDING  Incomplete  Culture, blood (routine x 2)     Status: None (Preliminary result)   Collection Time: 01/08/15  5:15 PM  Result Value Ref Range Status   Specimen Description BLOOD RIGHT HAND  Final   Special Requests BOTTLES DRAWN AEROBIC ONLY 10CC  Final   Culture   Final           BLOOD CULTURE RECEIVED NO GROWTH TO  DATE CULTURE WILL BE HELD FOR 5 DAYS BEFORE ISSUING A FINAL NEGATIVE REPORT Performed at [facility]    Report Status PENDING  Incomplete  Respiratory virus panel (routine influenza)     Status: None   Collection Time: 01/08/15  5:42 PM  Result Value Ref Range Status   Source - RVPAN NASAL SWAB  Corrected   Respiratory Syncytial Virus A Negative Negative Final   Respiratory Syncytial Virus B Negative Negative Final   Influenza A Negative Negative Final   Influenza B Negative Negative Final   Parainfluenza 1 Negative Negative Final   Parainfluenza 2 Negative Negative Final   Parainfluenza 3 Negative Negative Final   Metapneumovirus Negative Negative Final   Rhinovirus Negative Negative Final   Adenovirus Negative Negative Final    Comment: (NOTE) Performed At: [facility] [address] [place], [place] [id number] Mila HomerHancock William F MD WU:[phone]   Surgical pcr screen     Status: Abnormal   Collection Time: 01/10/15  5:15 AM  Result Value Ref Range Status   MRSA, PCR POSITIVE (A) NEGATIVE Final   Staphylococcus aureus POSITIVE (A) NEGATIVE Final    Comment:        The Xpert SA Assay (FDA approved for NASAL specimens in patients over 25 years of age), is one component of a comprehensive surveillance program.  Test performance has been validated by [facility] for patients greater than or equal to 25 year old. It is not intended to diagnose infection nor to guide or monitor treatment.      Studies: Dg Chest 2 View  01/12/2015   CLINICAL DATA:  Shortness of breath  EXAM:  CHEST  2 VIEW  COMPARISON:  CT scan of the chest of January 07, 2015 and PA and lateral chest x-ray of the same date  FINDINGS: The lungs are adequately inflated. There has been a progressive increase in the confluent interstitial densities in both lower lobes. There is no significant pleural effusion. The upper lobes are relatively spared. The heart and pulmonary vascularity are normal. The mediastinum is normal in width. The bony thorax is unremarkable.  IMPRESSION: Interval increase in bilateral confluent interstitial opacities compatible with pneumonia superimposed upon underlying chronic lung disease.   Electronically Signed   By: David  [place]   On: 01/12/2015 07:25   Dg Chest Port 1 View  01/12/2015   CLINICAL DATA:  Right-sided bronchoscopy with biopsy today.  EXAM: PORTABLE CHEST - 1 VIEW  COMPARISON:  01/12/2015 at 124 p.m., along with a others including 01/07/2015.  FINDINGS: Low lung volumes are present, causing crowding of the pulmonary vasculature. Essentially stable bibasilar airspace opacities compared to the exam from early this morning. No appreciable pneumothorax. No new pleural fluid collection identified. Cardiac and mediastinal margins appear normal.  IMPRESSION: 1. No pneumothorax or pleural effusion, status post bronchoscopy with biopsy. 2. Stable bibasilar airspace opacities.   Electronically Signed   By: Gaylyn RongWalter  Liebkemann M.D.   On: 01/12/2015 16:19   Dg C-arm Bronchoscopy  01/12/2015   CLINICAL DATA:    C-ARM BRONCHOSCOPY  Fluoroscopy was utilized by the requesting physician.  No radiographic  interpretation.     Scheduled Meds: . benzonatate  100 mg Oral Q8H  . Chlorhexidine Gluconate Cloth  6 each Topical Q0600  . dextromethorphan-guaiFENesin  2 tablet Oral BID  . feeding supplement (ENSURE COMPLETE)  237 mL Oral BID BM  . levofloxacin (LEVAQUIN) IV  750 mg Intravenous Q24H  . lidocaine  1 application Topical Once  .  methylPREDNISolone (SOLU-MEDROL) injection  80 mg  Intravenous Q12H  . mupirocin ointment  1 application Nasal BID  . pantoprazole  40 mg Oral BID AC   Continuous Infusions:   Active Problems:   Pulmonary infiltrates   Protein-calorie malnutrition, severe   Unintentional weight loss   Malar rash   Alopecia   Hemoptysis   Myalgia   Arthralgia   Fatigue   Chest pain   Community acquired pneumonia   Fever, unknown origin    Time spent: 25 min    Tuesday Terlecki  [facility] Pager [phone]  If 7PM-7AM, please contact night-coverage at [email or web address], password [facility] 01/12/2015, 9:00 PM  LOS: 5 days

## 2015-01-12 NOTE — Interval H&P Note (Signed)
PCCM F/u interval note  Pt presents for FOB, BAL, possible TBBx's  No new issues since this am.  She understands the procedure, all questions answered.   Plan for FOB, BAL, POssible RML TBBxs  Levy Pupaobert Byrum, MD, PhD 01/12/2015, 2:42 PM  Pulmonary and Critical Care [phone] or if no answer [phone]

## 2015-01-12 NOTE — Progress Notes (Signed)
Utilization review complete. Zaidy Absher RN CCM Case Mgmt phone [phone] 

## 2015-01-12 NOTE — Progress Notes (Signed)
PULMONARY  / CRITICAL CARE MEDICINE CONSULTATION   Name: Kayla [name] MRN: [id number] DOB: [birth year]    ADMISSION DATE:  01/07/2015 CONSULTATION DATE: 01/09/2015  REQUESTING CLINICIAN: Dr. Algis Liming PRIMARY SERVICE: TRH  CHIEF COMPLAINT:  FUO/pulmonary infiltrates present since at least 09/18/14   BRIEF PATIENT DESCRIPTION: 37 F with fevers, pulmonary opacities, and systemic complaints concerning for autoimmune disease.  SIGNIFICANT EVENTS / STUDIES:  CRP 1.6 ESR 43 TSH 1.537 Anti dsDNA, Jo, ENA, Ro, La, Scl-70, SM-RNP negative RPR Negative HIV negative Chest CT: Peripheral, patchy areas of opacities with ?cystic changes vs. Honeycombing - solumedrol 80 q12 started 01/10/15    SUBJECTIVE:  Still  Has cough.  Says she coughed blood this AM. VITAL SIGNS: Temp:  [97.7 F (36.5 C)-98.4 F (36.9 C)] 97.7 F (36.5 C) (02/22 0459) Pulse Rate:  [80] 80 (02/21 2042) Resp:  [16-17] 17 (02/22 0459) BP: (108-116)/(73-75) 108/73 mmHg (02/22 0459) SpO2:  [94 %-98 %] 94 % (02/22 0459) INTAKE / OUTPUT: Intake/Output      02/21 0701 - 02/22 0700 02/22 0701 - 02/23 0700   P.O. 720    IV Piggyback 150    Total Intake(mL/kg) 870 (13.7)    Urine (mL/kg/hr) 2052 (1.3) 1000 (3.2)   Total Output 2052 1000   Net -1182 -1000          PHYSICAL EXAMINATION: General: sitting in bed Neuro: normal strength HEENT: no sinus tenderness Cardiovascular:  RRR, NS1/S2, (-) MRG Lungs:  Coarse classic BV changes bilaterally  Abdomen:  S/NT/ND/(+)BS Musculoskeletal:  (-) C/C/E Skin:  Platches of discolored skin on lateral arms bilaterally and malar area  LABS:  CBC  Recent Labs Lab 01/09/15 0618 01/10/15 0620 01/12/15 0650  WBC 5.4 4.9 8.0  HGB 11.0* 11.3* 12.0  HCT 33.8* 34.4* 36.2  PLT 236 238 383   Coag's No results for input(s): APTT, INR in the last 168 hours.   BMET  Recent Labs Lab 01/09/15 0618 01/10/15 0620 01/12/15 0650  NA 135 139 137  K 3.3* 3.9 3.6  CL 106 111  108  CO2 '26 22 22  ' BUN <5* <5* 10  CREATININE 0.60 0.46* 0.54  GLUCOSE 96 90 138*   Electrolytes  Recent Labs Lab 01/09/15 0618 01/10/15 0620 01/12/15 0650  CALCIUM 7.6* 8.4 8.6   Sepsis Markers  Recent Labs Lab 01/07/15 1458 01/08/15 1705 01/08/15 1715 01/08/15 1939 01/10/15 0620 01/12/15 0650  LATICACIDVEN 1.05  --  1.1 1.8  --   --   PROCALCITON  --  <0.10  --   --  <0.10 <0.10   Liver Enzymes  Recent Labs Lab 01/10/15 0620  AST 78*  ALT 34  ALKPHOS 73  BILITOT 0.5  ALBUMIN 2.6*   Glucose  Recent Labs Lab 01/09/15 1949  GLUCAP 111*    Imaging Dg Chest 2 View  01/12/2015   CLINICAL DATA:  Shortness of breath  EXAM: CHEST  2 VIEW  COMPARISON:  CT scan of the chest of January 07, 2015 and PA and lateral chest x-ray of the same date  FINDINGS: The lungs are adequately inflated. There has been a progressive increase in the confluent interstitial densities in both lower lobes. There is no significant pleural effusion. The upper lobes are relatively spared. The heart and pulmonary vascularity are normal. The mediastinum is normal in width. The bony thorax is unremarkable.  IMPRESSION: Interval increase in bilateral confluent interstitial opacities compatible with pneumonia superimposed upon underlying chronic lung disease.   Electronically Signed  By: David  Martinique   On: 01/12/2015 07:25    ASSESSMENT / PLAN:  Dyspnea, fever, pulmonary infiltrates >> concern for ILD. P: Continue solumedrol For bronchoscopy at 230 pm on 2/22 >> procedure explained to pt Abx per ID Might need VATS Bx  Chesley Mires, MD [place] 01/12/2015, 11:59 AM Pager:  [phone] After 3pm call: [phone]

## 2015-01-12 NOTE — Progress Notes (Signed)
[name], female   DOB: Jan 18, 1991, 24 y.o.   MRN: [id number]         [facility]    Date of Admission:  01/07/2015   Total days of antibiotics 6        Day 5 levofloxacin         Active Problems:   Pulmonary infiltrates   Protein-calorie malnutrition, severe   Unintentional weight loss   Malar rash   Alopecia   Hemoptysis   Myalgia   Arthralgia   Fatigue   Chest pain   Community acquired pneumonia   Fever, unknown origin   . benzonatate  100 mg Oral Q8H  . Chlorhexidine Gluconate Cloth  6 each Topical Q0600  . dextromethorphan-guaiFENesin  2 tablet Oral BID  . feeding supplement (ENSURE COMPLETE)  237 mL Oral BID BM  . levofloxacin (LEVAQUIN) IV  750 mg Intravenous Q24H  . methylPREDNISolone (SOLU-MEDROL) injection  80 mg Intravenous Q12H  . mupirocin ointment  1 application Nasal BID  . pantoprazole  40 mg Oral BID AC    Subjective: Her cough and dyspnea on exertion are unchanged. She did cough up a small amount of bright red blood this morning. However, overall she is feeling better. She has more energy since steroids were started.  Review of Systems: Pertinent items are noted in HPI.  Past Medical History  Diagnosis Date  . Pneumonia 08/2014  . CAP (community acquired pneumonia) 01/07/2015  . GERD (gastroesophageal reflux disease)   . Daily headache     "sometimes" (01/08/2015)  . Arthritis     "hands and legs" (01/08/2015)    History  Substance Use Topics  . Smoking status: Former Smoker -- 0.10 packs/day for 5 years    Types: Cigarettes    Quit date: 11/20/2014  . Smokeless tobacco: Never Used  . Alcohol Use: Yes     Comment: 01/08/2015 "last drink was New Year's Eve"    History reviewed. No pertinent family history. Allergies  Allergen Reactions  . Zithromax [Azithromycin] Cough    OBJECTIVE: Blood pressure 114/72, pulse 82, temperature 97.9 F (36.6 C), temperature source Oral, resp. rate 18, height  5\' 4"  (1.626 m), weight 140 lb (63.504 kg), last menstrual period 12/27/2014, SpO2 96 %. General: She is a little more talkative today Skin: No change in rash Lungs: Bibasilar crackles posteriorly Cor: Regular S1 and S2 with no murmurs  Lab Results Lab Results  Component Value Date   WBC 8.0 01/12/2015   HGB 12.0 01/12/2015   HCT 36.2 01/12/2015   MCV 80.3 01/12/2015   PLT 383 01/12/2015    Lab Results  Component Value Date   CREATININE 0.54 01/12/2015   BUN 10 01/12/2015   NA 137 01/12/2015   K 3.6 01/12/2015   CL 108 01/12/2015   CO2 22 01/12/2015    Lab Results  Component Value Date   ALT 34 01/10/2015   AST 78* 01/10/2015   ALKPHOS 73 01/10/2015   BILITOT 0.5 01/10/2015     Microbiology: Recent Results (from the past 240 hour(s))  Culture, blood (routine x 2)     Status: None (Preliminary result)   Collection Time: 01/07/15  8:58 PM  Result Value Ref Range Status   Specimen Description BLOOD RIGHT ARM  Final   Special Requests BOTTLES DRAWN AEROBIC AND ANAEROBIC 10CC  Final   Culture   Final           BLOOD CULTURE RECEIVED NO  GROWTH TO DATE CULTURE WILL BE HELD FOR 5 DAYS BEFORE ISSUING A FINAL NEGATIVE REPORT Performed at [facility]    Report Status PENDING  Incomplete  Culture, blood (routine x 2)     Status: None (Preliminary result)   Collection Time: 01/07/15  9:03 PM  Result Value Ref Range Status   Specimen Description BLOOD RIGHT HAND  Final   Special Requests BOTTLES DRAWN AEROBIC ONLY 10CC  Final   Culture   Final           BLOOD CULTURE RECEIVED NO GROWTH TO DATE CULTURE WILL BE HELD FOR 5 DAYS BEFORE ISSUING A FINAL NEGATIVE REPORT Note: Culture results may be compromised due to an excessive volume of blood received in culture bottles. Performed at [facility]    Report Status PENDING  Incomplete  Culture, sputum-assessment     Status: None   Collection Time: 01/07/15  9:12 PM  Result Value Ref Range Status   Specimen  Description SPUTUM  Final   Special Requests NONE  Final   Sputum evaluation   Final    THIS SPECIMEN IS ACCEPTABLE. RESPIRATORY CULTURE REPORT TO FOLLOW.   Report Status 01/08/2015 FINAL  Final  Culture, respiratory (NON-Expectorated)     Status: None   Collection Time: 01/07/15  9:12 PM  Result Value Ref Range Status   Specimen Description SPUTUM  Final   Special Requests NONE  Final   Gram Stain   Final    MODERATE WBC PRESENT, PREDOMINANTLY PMN FEW SQUAMOUS EPITHELIAL CELLS PRESENT FEW GRAM POSITIVE COCCI IN PAIRS IN CHAINS IN CLUSTERS FEW GRAM NEGATIVE RODS Performed at [facility]    Culture   Final    NORMAL OROPHARYNGEAL FLORA Performed at [facility]    Report Status 01/10/2015 FINAL  Final  Culture, blood (routine x 2)     Status: None (Preliminary result)   Collection Time: 01/08/15  5:05 PM  Result Value Ref Range Status   Specimen Description BLOOD RIGHT ARM  Final   Special Requests BOTTLES DRAWN AEROBIC ONLY 6CC  Final   Culture   Final           BLOOD CULTURE RECEIVED NO GROWTH TO DATE CULTURE WILL BE HELD FOR 5 DAYS BEFORE ISSUING A FINAL NEGATIVE REPORT Performed at [facility]    Report Status PENDING  Incomplete  Culture, blood (routine x 2)     Status: None (Preliminary result)   Collection Time: 01/08/15  5:15 PM  Result Value Ref Range Status   Specimen Description BLOOD RIGHT HAND  Final   Special Requests BOTTLES DRAWN AEROBIC ONLY 10CC  Final   Culture   Final           BLOOD CULTURE RECEIVED NO GROWTH TO DATE CULTURE WILL BE HELD FOR 5 DAYS BEFORE ISSUING A FINAL NEGATIVE REPORT Performed at [facility]    Report Status PENDING  Incomplete  Respiratory virus panel (routine influenza)     Status: None   Collection Time: 01/08/15  5:42 PM  Result Value Ref Range Status   Source - RVPAN NASAL SWAB  Corrected   Respiratory Syncytial Virus A Negative Negative Final   Respiratory Syncytial Virus B Negative  Negative Final   Influenza A Negative Negative Final   Influenza B Negative Negative Final   Parainfluenza 1 Negative Negative Final   Parainfluenza 2 Negative Negative Final   Parainfluenza 3 Negative Negative Final   Metapneumovirus Negative Negative  Final   Rhinovirus Negative Negative Final   Adenovirus Negative Negative Final    Comment: (NOTE) Performed At: [facility] [address] [place], [place] [id number] Mila Homer MD WU:[id number]   Surgical pcr screen     Status: Abnormal   Collection Time: 01/10/15  5:15 AM  Result Value Ref Range Status   MRSA, PCR POSITIVE (A) NEGATIVE Final   Staphylococcus aureus POSITIVE (A) NEGATIVE Final    Comment:        The Xpert SA Assay (FDA approved for NASAL specimens in patients over 70 years of age), is one component of a comprehensive surveillance program.  Test performance has been validated by [facility] for patients greater than or equal to 14 year old. It is not intended to diagnose infection nor to guide or monitor treatment.     Assessment: I suspect that all of her illness is due to an autoimmune process but will continue empiric levofloxacin pending preliminary results of her bronchoscopy.  Plan: 1. Continue levofloxacin for now  Cliffton Asters, MD [facility] [phone] pager   [phone] cell 01/12/2015, 12:16 PM

## 2015-01-12 NOTE — Op Note (Signed)
Video Bronchoscopy Procedure Note  Date of Operation: 01/12/2015  Pre-op Diagnosis: B infiltrates  Post-op Diagnosis: Same  Surgeon: Levy PupaOBERT Ellisa Devivo  Assistants: none  Anesthesia: conscious sedation, moderate sedation  Meds Given: fentanyl 200mcg, versed 9mg  in divided doses, 1% lidocaine 25cc total  Operation: Flexible video fiberoptic bronchoscopy and biopsies.  Estimated Blood Loss: 5cc  Complications: none noted  Indications and History: Kayla [name] is 24 y.o. with history of B infiltrates.  Recommendation was to perform video fiberoptic bronchoscopy with BAL and biopsies. The risks, benefits, complications, treatment options and expected outcomes were discussed with the patient.  The possibilities of pneumothorax, pneumonia, reaction to medication, pulmonary aspiration, perforation of a viscus, bleeding, failure to diagnose a condition and creating a complication requiring transfusion or operation were discussed with the patient who freely signed the consent.    Description of Procedure: The patient was seen in the Preoperative Area, was examined and was deemed appropriate to proceed.  [facility], identified as Kayla PonsLashonna Beeghly and the procedure verified as Flexible Video Fiberoptic Bronchoscopy.  A Time Out was held and the above information confirmed.   Conscious sedation was initiated as indicated above. The video fiberoptic bronchoscope was introduced via the R nare and a general inspection was performed which showed small white plaques on the posterior pharynx consistent with thrush, but normal cords, normal trachea, normal main carina. The R sided airways were inspected and showed normal RUL, BI, RML and RLL. The L side was then inspected. The LLL, Lingular and LUL airways were normal. There were no endobronchial lesions or abnormal secretions.   A RML BAL was performed with 60cc NS instilled and approximately 30cc returned. This was sent for  cytology and microbiology. Then under fluoroscopic guidance random RML forceps transbronchial biopsies were performed for pathology.   The patient tolerated the procedure well. The bronchoscope was removed. There were no obvious complications.   A post-procedural CXR is pending.   Samples: 1. Transbronchial biopsies from RML 2. Bronchioalveolar Lavage from RML  Plans:  - transfer back to her room on 6 E after CXR reviewed - would consider treating for thrush with fluconazole or nystatin based on appearance of posterior pharynx.  - will review the cytology, pathology and microbiology results with the patient when they become available.      Levy Pupaobert Toribio Seiber, MD, PhD 01/12/2015, 3:14 PM [facility] [phone] or if no answer [phone]

## 2015-01-12 NOTE — H&P (View-Only) (Signed)
PULMONARY  / CRITICAL CARE MEDICINE CONSULTATION   Name: Kayla [name] MRN: [id number] DOB: 1991-07-13    ADMISSION DATE:  01/07/2015 CONSULTATION DATE: 01/09/2015  REQUESTING CLINICIAN: Dr. Algis Liming PRIMARY SERVICE: TRH  CHIEF COMPLAINT:  FUO/pulmonary infiltrates present since at least 09/18/14   BRIEF PATIENT DESCRIPTION: 4 F with fevers, pulmonary opacities, and systemic complaints concerning for autoimmune disease.  SIGNIFICANT EVENTS / STUDIES:  CRP 1.6 ESR 43 TSH 1.537 Anti dsDNA, Jo, ENA, Ro, La, Scl-70, SM-RNP negative RPR Negative HIV negative Chest CT: Peripheral, patchy areas of opacities with ?cystic changes vs. Honeycombing - solumedrol 80 q12 started 01/10/15    SUBJECTIVE:  Still  Has cough.  Says she coughed blood this AM. VITAL SIGNS: Temp:  [97.7 F (36.5 C)-98.4 F (36.9 C)] 97.7 F (36.5 C) (02/22 0459) Pulse Rate:  [80] 80 (02/21 2042) Resp:  [16-17] 17 (02/22 0459) BP: (108-116)/(73-75) 108/73 mmHg (02/22 0459) SpO2:  [94 %-98 %] 94 % (02/22 0459) INTAKE / OUTPUT: Intake/Output      02/21 0701 - 02/22 0700 02/22 0701 - 02/23 0700   P.O. 720    IV Piggyback 150    Total Intake(mL/kg) 870 (13.7)    Urine (mL/kg/hr) 2052 (1.3) 1000 (3.2)   Total Output 2052 1000   Net -1182 -1000          PHYSICAL EXAMINATION: General: sitting in bed Neuro: normal strength HEENT: no sinus tenderness Cardiovascular:  RRR, NS1/S2, (-) MRG Lungs:  Coarse classic BV changes bilaterally  Abdomen:  S/NT/ND/(+)BS Musculoskeletal:  (-) C/C/E Skin:  Platches of discolored skin on lateral arms bilaterally and malar area  LABS:  CBC  Recent Labs Lab 01/09/15 0618 01/10/15 0620 01/12/15 0650  WBC 5.4 4.9 8.0  HGB 11.0* 11.3* 12.0  HCT 33.8* 34.4* 36.2  PLT 236 238 383   Coag's No results for input(s): APTT, INR in the last 168 hours.   BMET  Recent Labs Lab 01/09/15 0618 01/10/15 0620 01/12/15 0650  NA 135 139 137  K 3.3* 3.9 3.6  CL 106 111  108  CO2 '26 22 22  ' BUN <5* <5* 10  CREATININE 0.60 0.46* 0.54  GLUCOSE 96 90 138*   Electrolytes  Recent Labs Lab 01/09/15 0618 01/10/15 0620 01/12/15 0650  CALCIUM 7.6* 8.4 8.6   Sepsis Markers  Recent Labs Lab 01/07/15 1458 01/08/15 1705 01/08/15 1715 01/08/15 1939 01/10/15 0620 01/12/15 0650  LATICACIDVEN 1.05  --  1.1 1.8  --   --   PROCALCITON  --  <0.10  --   --  <0.10 <0.10   Liver Enzymes  Recent Labs Lab 01/10/15 0620  AST 78*  ALT 34  ALKPHOS 73  BILITOT 0.5  ALBUMIN 2.6*   Glucose  Recent Labs Lab 01/09/15 1949  GLUCAP 111*    Imaging Dg Chest 2 View  01/12/2015   CLINICAL DATA:  Shortness of breath  EXAM: CHEST  2 VIEW  COMPARISON:  CT scan of the chest of January 07, 2015 and PA and lateral chest x-ray of the same date  FINDINGS: The lungs are adequately inflated. There has been a progressive increase in the confluent interstitial densities in both lower lobes. There is no significant pleural effusion. The upper lobes are relatively spared. The heart and pulmonary vascularity are normal. The mediastinum is normal in width. The bony thorax is unremarkable.  IMPRESSION: Interval increase in bilateral confluent interstitial opacities compatible with pneumonia superimposed upon underlying chronic lung disease.   Electronically Signed  By: David  Martinique   On: 01/12/2015 07:25    ASSESSMENT / PLAN:  Dyspnea, fever, pulmonary infiltrates >> concern for ILD. P: Continue solumedrol For bronchoscopy at 230 pm on 2/22 >> procedure explained to pt Abx per ID Might need VATS Bx  Chesley Mires, MD [place] 01/12/2015, 11:59 AM Pager:  [phone] After 3pm call: [phone]

## 2015-01-13 ENCOUNTER — Encounter (HOSPITAL_COMMUNITY): Payer: Self-pay | Admitting: Emergency Medicine

## 2015-01-13 LAB — ANCA TITERS: P-ANCA: <1:20 {titer}

## 2015-01-13 MED ORDER — HEPARIN SODIUM (PORCINE) 5000 UNIT/ML IJ SOLN
5000.0000 [IU] | Freq: Three times a day (TID) | INTRAMUSCULAR | Status: DC
  Administered 2015-01-13 – 2015-01-15 (×7): 5000 [IU] via SUBCUTANEOUS
  Filled 2015-01-13 (×9): qty 1

## 2015-01-13 NOTE — Progress Notes (Signed)
[name], female   DOB: [birth year], 24 y.o.   MRN: [id number]         [facility]    Date of Admission:  01/07/2015   Total days of antibiotics 7        Day 6 levofloxacin         Active Problems:   Pulmonary infiltrates   Protein-calorie malnutrition, severe   Unintentional weight loss   Malar rash   Alopecia   Hemoptysis   Myalgia   Arthralgia   Fatigue   Chest pain   Community acquired pneumonia   Fever, unknown origin   . benzonatate  100 mg Oral Q8H  . Chlorhexidine Gluconate Cloth  6 each Topical Q0600  . dextromethorphan-guaiFENesin  2 tablet Oral BID  . feeding supplement (ENSURE COMPLETE)  237 mL Oral BID BM  . heparin subcutaneous  5,000 Units Subcutaneous 3 times per day  . levofloxacin (LEVAQUIN) IV  750 mg Intravenous Q24H  . lidocaine  1 application Topical Once  . methylPREDNISolone (SOLU-MEDROL) injection  80 mg Intravenous Q12H  . mupirocin ointment  1 application Nasal BID  . pantoprazole  40 mg Oral BID AC    Subjective: She is feeling better. She has more energy since steroids were started and her cough is slightly better.  Review of Systems: Pertinent items are noted in HPI.  Past Medical History  Diagnosis Date  . Pneumonia 08/2014  . CAP (community acquired pneumonia) 01/07/2015  . GERD (gastroesophageal reflux disease)   . Daily headache     "sometimes" (01/08/2015)  . Arthritis     "hands and legs" (01/08/2015)    History  Substance Use Topics  . Smoking status: Former Smoker -- 0.10 packs/day for 5 years    Types: Cigarettes    Quit date: 11/20/2014  . Smokeless tobacco: Never Used  . Alcohol Use: Yes     Comment: 01/08/2015 "last drink was New Year's Eve"    History reviewed. No pertinent family history. Allergies  Allergen Reactions  . Zithromax [Azithromycin] Cough    OBJECTIVE: Blood pressure 116/67, pulse 67, temperature 98.1 F (36.7 C), temperature source Oral, resp. rate  16, height 5\' 4"  (1.626 m), weight 149 lb 8 oz (67.813 kg), last menstrual period 12/27/2014, SpO2 98 %.  Skin: No change in rash Lungs: Bibasilar crackles posteriorly Cor: Regular S1 and S2 with no murmurs  Lab Results Lab Results  Component Value Date   WBC 8.0 01/12/2015   HGB 12.0 01/12/2015   HCT 36.2 01/12/2015   MCV 80.3 01/12/2015   PLT 383 01/12/2015    Lab Results  Component Value Date   CREATININE 0.54 01/12/2015   BUN 10 01/12/2015   NA 137 01/12/2015   K 3.6 01/12/2015   CL 108 01/12/2015   CO2 22 01/12/2015    Lab Results  Component Value Date   ALT 34 01/10/2015   AST 78* 01/10/2015   ALKPHOS 73 01/10/2015   BILITOT 0.5 01/10/2015     Microbiology: Recent Results (from the past 240 hour(s))  Culture, blood (routine x 2)     Status: None (Preliminary result)   Collection Time: 01/07/15  8:58 PM  Result Value Ref Range Status   Specimen Description BLOOD RIGHT ARM  Final   Special Requests BOTTLES DRAWN AEROBIC AND ANAEROBIC 10CC  Final   Culture   Final           BLOOD CULTURE RECEIVED NO GROWTH TO  DATE CULTURE WILL BE HELD FOR 5 DAYS BEFORE ISSUING A FINAL NEGATIVE REPORT Performed at [facility]    Report Status PENDING  Incomplete  Culture, blood (routine x 2)     Status: None (Preliminary result)   Collection Time: 01/07/15  9:03 PM  Result Value Ref Range Status   Specimen Description BLOOD RIGHT HAND  Final   Special Requests BOTTLES DRAWN AEROBIC ONLY 10CC  Final   Culture   Final           BLOOD CULTURE RECEIVED NO GROWTH TO DATE CULTURE WILL BE HELD FOR 5 DAYS BEFORE ISSUING A FINAL NEGATIVE REPORT Note: Culture results may be compromised due to an excessive volume of blood received in culture bottles. Performed at [facility]    Report Status PENDING  Incomplete  Culture, sputum-assessment     Status: None   Collection Time: 01/07/15  9:12 PM  Result Value Ref Range Status   Specimen Description SPUTUM  Final    Special Requests NONE  Final   Sputum evaluation   Final    THIS SPECIMEN IS ACCEPTABLE. RESPIRATORY CULTURE REPORT TO FOLLOW.   Report Status 01/08/2015 FINAL  Final  Culture, respiratory (NON-Expectorated)     Status: None   Collection Time: 01/07/15  9:12 PM  Result Value Ref Range Status   Specimen Description SPUTUM  Final   Special Requests NONE  Final   Gram Stain   Final    MODERATE WBC PRESENT, PREDOMINANTLY PMN FEW SQUAMOUS EPITHELIAL CELLS PRESENT FEW GRAM POSITIVE COCCI IN PAIRS IN CHAINS IN CLUSTERS FEW GRAM NEGATIVE RODS Performed at [facility]    Culture   Final    NORMAL OROPHARYNGEAL FLORA Performed at [facility]    Report Status 01/10/2015 FINAL  Final  Culture, blood (routine x 2)     Status: None (Preliminary result)   Collection Time: 01/08/15  5:05 PM  Result Value Ref Range Status   Specimen Description BLOOD RIGHT ARM  Final   Special Requests BOTTLES DRAWN AEROBIC ONLY 6CC  Final   Culture   Final           BLOOD CULTURE RECEIVED NO GROWTH TO DATE CULTURE WILL BE HELD FOR 5 DAYS BEFORE ISSUING A FINAL NEGATIVE REPORT Performed at [facility]    Report Status PENDING  Incomplete  Culture, blood (routine x 2)     Status: None (Preliminary result)   Collection Time: 01/08/15  5:15 PM  Result Value Ref Range Status   Specimen Description BLOOD RIGHT HAND  Final   Special Requests BOTTLES DRAWN AEROBIC ONLY 10CC  Final   Culture   Final           BLOOD CULTURE RECEIVED NO GROWTH TO DATE CULTURE WILL BE HELD FOR 5 DAYS BEFORE ISSUING A FINAL NEGATIVE REPORT Performed at [facility]    Report Status PENDING  Incomplete  Respiratory virus panel (routine influenza)     Status: None   Collection Time: 01/08/15  5:42 PM  Result Value Ref Range Status   Source - RVPAN NASAL SWAB  Corrected   Respiratory Syncytial Virus A Negative Negative Final   Respiratory Syncytial Virus B Negative Negative Final   Influenza A  Negative Negative Final   Influenza B Negative Negative Final   Parainfluenza 1 Negative Negative Final   Parainfluenza 2 Negative Negative Final   Parainfluenza 3 Negative Negative Final   Metapneumovirus Negative Negative Final  Rhinovirus Negative Negative Final   Adenovirus Negative Negative Final    Comment: (NOTE) Performed At: [facility] [address] [place], [place] [id number] Mila Homer MD NW:[id number]   Surgical pcr screen     Status: Abnormal   Collection Time: 01/10/15  5:15 AM  Result Value Ref Range Status   MRSA, PCR POSITIVE (A) NEGATIVE Final   Staphylococcus aureus POSITIVE (A) NEGATIVE Final    Comment:        The Xpert SA Assay (FDA approved for NASAL specimens in patients over 65 years of age), is one component of a comprehensive surveillance program.  Test performance has been validated by [facility] for patients greater than or equal to 40 year old. It is not intended to diagnose infection nor to guide or monitor treatment.   AFB culture with smear     Status: None (Preliminary result)   Collection Time: 01/12/15  3:00 PM  Result Value Ref Range Status   Specimen Description BRONCHIAL ALVEOLAR LAVAGE  Final   Special Requests Normal  Final   Acid Fast Smear   Final    NO ACID FAST BACILLI SEEN Performed at [facility]    Culture   Final    CULTURE WILL BE EXAMINED FOR 6 WEEKS BEFORE ISSUING A FINAL REPORT Performed at [facility]    Report Status PENDING  Incomplete  Culture, bal-quantitative     Status: None (Preliminary result)   Collection Time: 01/12/15  3:00 PM  Result Value Ref Range Status   Specimen Description BRONCHIAL ALVEOLAR LAVAGE  Final   Special Requests Normal  Final   Gram Stain   Final    NO WBC SEEN NO SQUAMOUS EPITHELIAL CELLS SEEN NO ORGANISMS SEEN Performed at [facility] PENDING  Incomplete   Culture PENDING  Incomplete   Report Status PENDING   Incomplete  Fungus Culture with Smear     Status: None (Preliminary result)   Collection Time: 01/12/15  3:00 PM  Result Value Ref Range Status   Specimen Description BRONCHIAL ALVEOLAR LAVAGE  Final   Special Requests Normal  Final   Fungal Smear   Final    NO YEAST OR FUNGAL ELEMENTS SEEN Performed at [facility]    Culture   Final    CULTURE IN PROGRESS FOR FOUR WEEKS Performed at [facility]    Report Status PENDING  Incomplete   Respiratory virus panel: Negative  Assessment: I suspect that all of her illness is due to an autoimmune process.  Plan: 1. Discontinue levofloxacin and observe off of antibiotics 2. Continue steroids  Cliffton Asters, MD [facility] [phone] pager   [phone] cell 01/13/2015, 3:20 PM

## 2015-01-13 NOTE — Progress Notes (Signed)
[facility] PROGRESS NOTE  Kayla [name] NFA:[id number] DOB: [birth year] DOA: 01/07/2015 PCP: No PCP Per Patient Interim summary: 24 y.o. female with no significant past medical history comes in for sob, palpitations. She was found to have multifocal pneumonia a nd ILD. ID and pulmonology consulted. She underwent FOB on 2/22 and biopsies were taken from RML. If biopsies are not diagnostic, she will need VATS biopsy. She was started on IV levaquin on 2/17 and discontinued on 2/23 by ID.  IV steroids were started by pulmonology on 2/20 , her breathing has improved after we started her steroids. She is still requiring 2 lit Meta oxygen.  Assessment/Plan: 1. Acute respiratory failure probably secondary to auto immune process and  Community acquired pneumonia. She was started on  on IV levaquin on 2/17 and discontinued on 2/23rd.  and steroids was started by pulmonologist on 2/20. Her breathing has much improved after we started her steroids.   She is requiring nasal canula oxygen and is on 2lit and sat are in low 90's.  She is cheerful. Underwent bronchoscopy on 2/22 and biopsies and bronchial washings were taken and sent for analysis, including BAL afb and fungal cultures. If the biopsies are negataive, plan for VATS.   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 Sickle cell screen negative. STABLE.   Malar rash: Auto immune work up and outpatient follow up with rheumatology improving with steroids. Her joint pains have also improved.   Code Status: full code.  Family Communication: discussed in detail with aunt at bedside on 2/22, none tody.  Disposition Plan: pending further work up. Possibly home soon.    Consultants:  ID  Pulmonary.   Procedures:  none  Antibiotics:  IV levaquin  Day 5.  HPI/Subjective: No chest pain, sob improved. Sitting in bed. , cheerful. Currently on [place] oxygen. Pulse ox showing 98%. Little upset about how many days she has to stay in the hospital    Objective: Filed Vitals:   01/13/15 1834  BP: 120/77  Pulse: 85  Temp: 98 F (36.7 C)  Resp: 17    Intake/Output Summary (Last 24 hours) at 01/13/15 1905 Last data filed at 01/13/15 0549  Gross per 24 hour  Intake    620 ml  Output    750 ml  Net   -130 ml   Filed Weights   01/09/15 1940 01/11/15 0057 01/12/15 2237  Weight: 65.273 kg (143 lb 14.4 oz) 63.504 kg (140 lb) 67.813 kg (149 lb 8 oz)    Exam:   General:  Alert afebrile comfortable, not in any distress.   Cardiovascular: s1s2, tachycardia improved.   Respiratory: tachypnea improved, clear to auscultation,  no wheezing or rhonchi.    Abdomen: soft non tender non distended. Bowel sounds heard, no sings of peritonitis.   Musculoskeletal: no pedal edema, cyanosis or clubbing.   Data Reviewed: Basic Metabolic Panel:  Recent Labs Lab 01/07/15 1307 01/09/15 0618 01/10/15 0620 01/12/15 0650  NA 135 135 139 137  K 3.5 3.3* 3.9 3.6  CL 101 106 111 108  CO2 23 26 22 22   GLUCOSE 115* 96 90 138*  BUN <5* <5* <5* 10  CREATININE 0.65 0.60 0.46* 0.54  CALCIUM 8.9 7.6* 8.4 8.6   Liver Function Tests:  Recent Labs Lab 01/10/15 0620  AST 78*  ALT 34  ALKPHOS 73  BILITOT 0.5  PROT 6.3  ALBUMIN 2.6*   No results for input(s): LIPASE, AMYLASE in the last 168 hours. No results for input(s): AMMONIA  in the last 168 hours. CBC:  Recent Labs Lab 01/07/15 1307 01/09/15 0618 01/10/15 0620 01/12/15 0650  WBC 5.0 5.4 4.9 8.0  HGB 12.7 11.0* 11.3* 12.0  HCT 38.4 33.8* 34.4* 36.2  MCV 80.5 81.1 80.9 80.3  PLT 229 236 238 383   Cardiac Enzymes: No results for input(s): CKTOTAL, CKMB, CKMBINDEX, TROPONINI in the last 168 hours. BNP (last 3 results)  Recent Labs  01/07/15 1307  BNP 10.2    ProBNP (last 3 results) No results for input(s): PROBNP in the last 8760 hours.  CBG:  Recent Labs Lab 01/09/15 1949  GLUCAP 111*    Recent Results (from the past 240 hour(s))  Culture, blood (routine  x 2)     Status: None (Preliminary result)   Collection Time: 01/07/15  8:58 PM  Result Value Ref Range Status   Specimen Description BLOOD RIGHT ARM  Final   Special Requests BOTTLES DRAWN AEROBIC AND ANAEROBIC 10CC  Final   Culture   Final           BLOOD CULTURE RECEIVED NO GROWTH TO DATE CULTURE WILL BE HELD FOR 5 DAYS BEFORE ISSUING A FINAL NEGATIVE REPORT Performed at [facility]    Report Status PENDING  Incomplete  Culture, blood (routine x 2)     Status: None (Preliminary result)   Collection Time: 01/07/15  9:03 PM  Result Value Ref Range Status   Specimen Description BLOOD RIGHT HAND  Final   Special Requests BOTTLES DRAWN AEROBIC ONLY 10CC  Final   Culture   Final           BLOOD CULTURE RECEIVED NO GROWTH TO DATE CULTURE WILL BE HELD FOR 5 DAYS BEFORE ISSUING A FINAL NEGATIVE REPORT Note: Culture results may be compromised due to an excessive volume of blood received in culture bottles. Performed at [facility]    Report Status PENDING  Incomplete  Culture, sputum-assessment     Status: None   Collection Time: 01/07/15  9:12 PM  Result Value Ref Range Status   Specimen Description SPUTUM  Final   Special Requests NONE  Final   Sputum evaluation   Final    THIS SPECIMEN IS ACCEPTABLE. RESPIRATORY CULTURE REPORT TO FOLLOW.   Report Status 01/08/2015 FINAL  Final  Culture, respiratory (NON-Expectorated)     Status: None   Collection Time: 01/07/15  9:12 PM  Result Value Ref Range Status   Specimen Description SPUTUM  Final   Special Requests NONE  Final   Gram Stain   Final    MODERATE WBC PRESENT, PREDOMINANTLY PMN FEW SQUAMOUS EPITHELIAL CELLS PRESENT FEW GRAM POSITIVE COCCI IN PAIRS IN CHAINS IN CLUSTERS FEW GRAM NEGATIVE RODS Performed at [facility]    Culture   Final    NORMAL OROPHARYNGEAL FLORA Performed at [facility]    Report Status 01/10/2015 FINAL  Final  Culture, blood (routine x 2)     Status: None (Preliminary  result)   Collection Time: 01/08/15  5:05 PM  Result Value Ref Range Status   Specimen Description BLOOD RIGHT ARM  Final   Special Requests BOTTLES DRAWN AEROBIC ONLY 6CC  Final   Culture   Final           BLOOD CULTURE RECEIVED NO GROWTH TO DATE CULTURE WILL BE HELD FOR 5 DAYS BEFORE ISSUING A FINAL NEGATIVE REPORT Performed at [facility]    Report Status PENDING  Incomplete  Culture, blood (routine x  2)     Status: None (Preliminary result)   Collection Time: 01/08/15  5:15 PM  Result Value Ref Range Status   Specimen Description BLOOD RIGHT HAND  Final   Special Requests BOTTLES DRAWN AEROBIC ONLY 10CC  Final   Culture   Final           BLOOD CULTURE RECEIVED NO GROWTH TO DATE CULTURE WILL BE HELD FOR 5 DAYS BEFORE ISSUING A FINAL NEGATIVE REPORT Performed at [facility]    Report Status PENDING  Incomplete  Respiratory virus panel (routine influenza)     Status: None   Collection Time: 01/08/15  5:42 PM  Result Value Ref Range Status   Source - RVPAN NASAL SWAB  Corrected   Respiratory Syncytial Virus A Negative Negative Final   Respiratory Syncytial Virus B Negative Negative Final   Influenza A Negative Negative Final   Influenza B Negative Negative Final   Parainfluenza 1 Negative Negative Final   Parainfluenza 2 Negative Negative Final   Parainfluenza 3 Negative Negative Final   Metapneumovirus Negative Negative Final   Rhinovirus Negative Negative Final   Adenovirus Negative Negative Final    Comment: (NOTE) Performed At: [facility] [address] [place], [place] [id number] Mila Homer MD LZ:[id number]   Surgical pcr screen     Status: Abnormal   Collection Time: 01/10/15  5:15 AM  Result Value Ref Range Status   MRSA, PCR POSITIVE (A) NEGATIVE Final   Staphylococcus aureus POSITIVE (A) NEGATIVE Final    Comment:        The Xpert SA Assay (FDA approved for NASAL specimens in patients over 64 years of age), is one component  of a comprehensive surveillance program.  Test performance has been validated by [facility] for patients greater than or equal to 12 year old. It is not intended to diagnose infection nor to guide or monitor treatment.   AFB culture with smear     Status: None (Preliminary result)   Collection Time: 01/12/15  3:00 PM  Result Value Ref Range Status   Specimen Description BRONCHIAL ALVEOLAR LAVAGE  Final   Special Requests Normal  Final   Acid Fast Smear   Final    NO ACID FAST BACILLI SEEN Performed at [facility]    Culture   Final    CULTURE WILL BE EXAMINED FOR 6 WEEKS BEFORE ISSUING A FINAL REPORT Performed at [facility]    Report Status PENDING  Incomplete  Culture, bal-quantitative     Status: None (Preliminary result)   Collection Time: 01/12/15  3:00 PM  Result Value Ref Range Status   Specimen Description BRONCHIAL ALVEOLAR LAVAGE  Final   Special Requests Normal  Final   Gram Stain   Final    NO WBC SEEN NO SQUAMOUS EPITHELIAL CELLS SEEN NO ORGANISMS SEEN Performed at [facility] PENDING  Incomplete   Culture PENDING  Incomplete   Report Status PENDING  Incomplete  Fungus Culture with Smear     Status: None (Preliminary result)   Collection Time: 01/12/15  3:00 PM  Result Value Ref Range Status   Specimen Description BRONCHIAL ALVEOLAR LAVAGE  Final   Special Requests Normal  Final   Fungal Smear   Final    NO YEAST OR FUNGAL ELEMENTS SEEN Performed at [facility]    Culture   Final    CULTURE IN PROGRESS FOR FOUR WEEKS Performed at [facility]    Report Status PENDING  Incomplete     Studies: Dg Chest 2 View  01/12/2015   CLINICAL DATA:  Shortness of breath  EXAM: CHEST  2 VIEW  COMPARISON:  CT scan of the chest of January 07, 2015 and PA and lateral chest x-ray of the same date  FINDINGS: The lungs are adequately inflated. There has been a progressive increase in the confluent  interstitial densities in both lower lobes. There is no significant pleural effusion. The upper lobes are relatively spared. The heart and pulmonary vascularity are normal. The mediastinum is normal in width. The bony thorax is unremarkable.  IMPRESSION: Interval increase in bilateral confluent interstitial opacities compatible with pneumonia superimposed upon underlying chronic lung disease.   Electronically Signed   By: David  [place]   On: 01/12/2015 07:25   Dg Chest Port 1 View  01/12/2015   CLINICAL DATA:  Right-sided bronchoscopy with biopsy today.  EXAM: PORTABLE CHEST - 1 VIEW  COMPARISON:  01/12/2015 at 124 p.m., along with a others including 01/07/2015.  FINDINGS: Low lung volumes are present, causing crowding of the pulmonary vasculature. Essentially stable bibasilar airspace opacities compared to the exam from early this morning. No appreciable pneumothorax. No new pleural fluid collection identified. Cardiac and mediastinal margins appear normal.  IMPRESSION: 1. No pneumothorax or pleural effusion, status post bronchoscopy with biopsy. 2. Stable bibasilar airspace opacities.   Electronically Signed   By: Gaylyn Rong M.D.   On: 01/12/2015 16:19   Dg C-arm Bronchoscopy  01/12/2015   CLINICAL DATA:    C-ARM BRONCHOSCOPY  Fluoroscopy was utilized by the requesting physician.  No radiographic  interpretation.     Scheduled Meds: . benzonatate  100 mg Oral Q8H  . Chlorhexidine Gluconate Cloth  6 each Topical Q0600  . dextromethorphan-guaiFENesin  2 tablet Oral BID  . feeding supplement (ENSURE COMPLETE)  237 mL Oral BID BM  . heparin subcutaneous  5,000 Units Subcutaneous 3 times per day  . lidocaine  1 application Topical Once  . methylPREDNISolone (SOLU-MEDROL) injection  80 mg Intravenous Q12H  . mupirocin ointment  1 application Nasal BID  . pantoprazole  40 mg Oral BID AC   Continuous Infusions:   Active Problems:   Pulmonary infiltrates   Protein-calorie malnutrition,  severe   Unintentional weight loss   Malar rash   Alopecia   Hemoptysis   Myalgia   Arthralgia   Fatigue   Chest pain   Community acquired pneumonia   Fever, unknown origin    Time spent: 25 min    Tymarion Everard  [facility] Pager [phone]  If 7PM-7AM, please contact night-coverage at [email or web address], password [facility] 01/13/2015, 7:05 PM  LOS: 6 days

## 2015-01-13 NOTE — Progress Notes (Signed)
PULMONARY  / CRITICAL CARE MEDICINE CONSULTATION   Name: Briana Farner MRN: [id number] DOB: 05-12-1991    ADMISSION DATE:  01/07/2015 CONSULTATION DATE: 01/09/2015  REQUESTING CLINICIAN: Dr. Algis Liming PRIMARY SERVICE: TRH  CHIEF COMPLAINT:  FUO/pulmonary infiltrates present since at least 09/18/14   BRIEF PATIENT DESCRIPTION:  24 yo female presented with fever, cough, dyspnea, and chest pain with pulmonary infiltrates on CXR.  Found to have positive SSa, SSb antibodies.  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  VITAL SIGNS: Temp:  [97.9 F (36.6 C)-98.2 F (36.8 C)] 98.1 F (36.7 C) (02/23 0516) Pulse Rate:  [64-121] 67 (02/23 0516) Resp:  [15-30] 16 (02/23 0516) BP: (104-156)/(67-94) 116/67 mmHg (02/23 0516) SpO2:  [92 %-100 %] 98 % (02/23 0516) Weight:  [149 lb 8 oz (67.813 kg)] 149 lb 8 oz (67.813 kg) (02/22 2237) INTAKE / OUTPUT: Intake/Output      02/22 0701 - 02/23 0700 02/23 0701 - 02/24 0700   P.O. 620    IV Piggyback     Total Intake(mL/kg) 620 (9.1)    Urine (mL/kg/hr) 1750 (1.1)    Total Output 1750     Net -1130            PHYSICAL EXAMINATION: General: sitting in bed Neuro: normal strength HEENT: no sinus tenderness Cardiovascular: regular Lungs: faint crackles b/l Abdomen: non tender Musculoskeletal: no edema Skin:  Platches of discolored skin on lateral arms bilaterally and malar area  LABS:  CBC  Recent Labs Lab 01/09/15 0618 01/10/15 0620 01/12/15 0650  WBC 5.4 4.9 8.0  HGB 11.0* 11.3* 12.0  HCT 33.8* 34.4* 36.2  PLT 236 238 383    BMET  Recent Labs Lab 01/09/15 0618 01/10/15 0620 01/12/15 0650  NA 135 139 137  K 3.3* 3.9 3.6  CL 106 111 108  CO2 '26 22 22  ' BUN <5* <5* 10  CREATININE 0.60 0.46* 0.54  GLUCOSE 96 90 138*   Electrolytes  Recent Labs Lab 01/09/15 0618 01/10/15 0620 01/12/15 0650  CALCIUM 7.6* 8.4 8.6   Sepsis Markers  Recent Labs Lab 01/07/15 1458 01/08/15 1705 01/08/15 1715 01/08/15 1939 01/10/15 0620 01/12/15 0650  LATICACIDVEN 1.05  --  1.1 1.8  --   --   PROCALCITON  --  <0.10  --   --  <0.10 <0.10   Liver Enzymes  Recent Labs Lab 01/10/15 0620  AST 78*  ALT 34  ALKPHOS 73  BILITOT 0.5  ALBUMIN 2.6*   Glucose  Recent Labs Lab 01/09/15 1949  GLUCAP 111*    Imaging Dg Chest 2 View  01/12/2015   CLINICAL DATA:  Shortness of breath  EXAM: CHEST  2 VIEW  COMPARISON:  CT scan of the chest of January 07, 2015 and PA and lateral chest x-ray of the same date  FINDINGS: The lungs are adequately inflated. There has been a progressive increase in the confluent interstitial densities in both lower lobes. There is no significant pleural effusion. The upper lobes are relatively spared. The heart and pulmonary vascularity are normal. The mediastinum is normal in  width. The bony thorax is unremarkable.  IMPRESSION: Interval increase in bilateral confluent interstitial opacities compatible with pneumonia superimposed upon underlying chronic lung disease.   Electronically Signed   By: David  Martinique   On: 01/12/2015 07:25   Dg Chest Port 1 View  01/12/2015   CLINICAL DATA:  Right-sided bronchoscopy with biopsy today.  EXAM: PORTABLE CHEST - 1 VIEW  COMPARISON:  01/12/2015 at 124 p.m., along with a others including 01/07/2015.  FINDINGS: Low lung volumes are present, causing crowding of the pulmonary vasculature. Essentially stable bibasilar airspace opacities compared to the exam from early this morning. No appreciable pneumothorax. No new pleural fluid collection identified.  Cardiac and mediastinal margins appear normal.  IMPRESSION: 1. No pneumothorax or pleural effusion, status post bronchoscopy with biopsy. 2. Stable bibasilar airspace opacities.   Electronically Signed   By: Van Clines M.D.   On: 01/12/2015 16:19   Dg C-arm Bronchoscopy  01/12/2015   CLINICAL DATA:    C-ARM BRONCHOSCOPY  Fluoroscopy was utilized by the requesting physician.  No radiographic  interpretation.     ASSESSMENT / PLAN:  Dyspnea, fever, pulmonary infiltrates with positive SSa, SSb antibodies >> concern for ILD. P: Continue solumedrol F/u bronchoscopy results from 2/22 F/u ANCA from 2/20, ANA from 2/22 Abx per ID Might need VATS Bx if bronchoscopy results non diagnostic F/u CXR 2/24  Chesley Mires, MD [place] 01/13/2015, 10:58 AM Pager:  [phone] After 3pm call: [phone]

## 2015-01-14 ENCOUNTER — Inpatient Hospital Stay (HOSPITAL_COMMUNITY): Payer: No Typology Code available for payment source

## 2015-01-14 DIAGNOSIS — R5383 Other fatigue: Secondary | ICD-10-CM

## 2015-01-14 LAB — CULTURE, BLOOD (ROUTINE X 2)
CULTURE: NO GROWTH
Culture: NO GROWTH

## 2015-01-14 LAB — ANTINUCLEAR ANTIBODIES, IFA: ANA Ab, IFA: NEGATIVE

## 2015-01-14 MED ORDER — PREDNISONE 50 MG PO TABS
60.0000 mg | ORAL_TABLET | Freq: Every day | ORAL | Status: DC
  Administered 2015-01-14 – 2015-01-15 (×2): 60 mg via ORAL
  Filled 2015-01-14 (×4): qty 1

## 2015-01-14 NOTE — Progress Notes (Signed)
[facility] PROGRESS NOTE  Kayla [name] QVZ:[id number] DOB: 1991/08/11 DOA: 01/07/2015 PCP: No PCP Per Patient     Interim summary: 23 y.o. female with no significant past medical history comes in for sob, palpitations. She was found to have multifocal pneumonia a nd ILD. ID and pulmonology consulted. She underwent bronchoscopy on 2/22 and biopsies were taken from RML. If biopsies are not diagnostic, she will need VATS biopsy. She was started on IV levaquin on 2/17 and discontinued on 2/23 by ID.  IV steroids were started by pulmonology on 2/20 , her breathing has improved after starting steroids. She is still requiring 2 lit [place] oxygen.    Assessment/Plan:  Acute respiratory failure probably secondary to auto immune process and  Community acquired pneumonia.  - She was started on  on IV levaquin on 2/17 and discontinued on 2/23rd. ID and pulmonology was consulted.  - Patient was placed on steroids, breathing has much improved  - Patient underwent bronchoscopy on 2/22 and biopsies and bronchial washings were taken and sent for analysis, including BAL afb and fungal cultures. 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 Sickle cell screen negative - H/H stable  Malar rash: Auto immune work up and outpatient follow up with rheumatology improving with steroids. Her joint pains have also improved.   Code Status: full code.   Family Communication: discussed in detail with patient   Disposition Plan: DC in AM   Consultants:  ID  Pulmonary.   Procedures:  none  Antibiotics:  IV levaquin  discontinued  Subjective: Feels better, denies any chest pain   Objective: BP 117/60 mmHg  Pulse 92  Temp(Src) 98.4 F (36.9 C) (Oral)  Resp 20  Ht  (1.626 m)  Wt 64.003 kg (141 lb 1.6 oz)  BMI 24.21 kg/m2  SpO2 100%  LMP 12/27/2014  Intake/Output Summary (Last  24 hours) at 01/14/15 1423 Last data filed at 01/14/15 1300  Gross per 24 hour  Intake    720 ml  Output   1150 ml  Net   -430 ml   Filed Weights   01/11/15 0057 01/12/15 2237 01/13/15 2151  Weight: 63.504 kg (140 lb) 67.813 kg (149 lb 8 oz) 64.003 kg (141 lb 1.6 oz)    Exam:   General:  Alert x oriented x 3, NAD  CVS: s1s2,RRR  Chest: clear to auscultation, no wheezing or rhonchi.    Abdomen: soft NT, ND, NBS  Ext: no pedal edema, cyanosis or clubbing.   Data Reviewed: Basic Metabolic Panel:  Recent Labs Lab 01/09/15 0618 01/10/15 0620 01/12/15 0650  NA 135 139 137  K 3.3* 3.9 3.6  CL 106 111 108  CO2 GLUCOSE 96 90 138*  BUN <5* <5* 10  CREATININE 0.60 0.46* 0.54  CALCIUM 7.6* 8.4 8.6   Liver Function Tests:  Recent Labs Lab 01/10/15 0620  AST 78*  ALT 34  ALKPHOS 73  BILITOT 0.5  PROT 6.3  ALBUMIN 2.6*   No results for input(s): LIPASE, AMYLASE in the last 168 hours. No results for input(s): AMMONIA in the last 168 hours. CBC:  Recent Labs Lab 01/09/15 0618 01/10/15 0620 01/12/15 0650  WBC 5.4 4.9 8.0  HGB 11.0* 11.3* 12.0  HCT 33.8* 34.4* 36.2  MCV 81.1 80.9 80.3  PLT 236 238 383   Cardiac Enzymes: No results for input(s): CKTOTAL, CKMB,  CKMBINDEX, TROPONINI in the last 168 hours. BNP (last 3 results)  Recent Labs  01/07/15 1307  BNP 10.2    ProBNP (last 3 results) No results for input(s): PROBNP in the last 8760 hours.  CBG:  Recent Labs Lab 01/09/15 1949  GLUCAP 111*    Recent Results (from the past 240 hour(s))  Culture, blood (routine x 2)     Status: None   Collection Time: 01/07/15  8:58 PM  Result Value Ref Range Status   Specimen Description BLOOD RIGHT ARM  Final   Special Requests BOTTLES DRAWN AEROBIC AND ANAEROBIC 10CC  Final   Culture   Final    NO GROWTH 5 DAYS Performed at [facility]    Report Status 01/14/2015 FINAL  Final  Culture, blood (routine x 2)     Status: None    Collection Time: 01/07/15  9:03 PM  Result Value Ref Range Status   Specimen Description BLOOD RIGHT HAND  Final   Special Requests BOTTLES DRAWN AEROBIC ONLY 10CC  Final   Culture   Final    NO GROWTH 5 DAYS Note: Culture results may be compromised due to an excessive volume of blood received in culture bottles. Performed at [facility]    Report Status 01/14/2015 FINAL  Final  Culture, sputum-assessment     Status: None   Collection Time: 01/07/15  9:12 PM  Result Value Ref Range Status   Specimen Description SPUTUM  Final   Special Requests NONE  Final   Sputum evaluation   Final    THIS SPECIMEN IS ACCEPTABLE. RESPIRATORY CULTURE REPORT TO FOLLOW.   Report Status 01/08/2015 FINAL  Final  Culture, respiratory (NON-Expectorated)     Status: None   Collection Time: 01/07/15  9:12 PM  Result Value Ref Range Status   Specimen Description SPUTUM  Final   Special Requests NONE  Final   Gram Stain   Final    MODERATE WBC PRESENT, PREDOMINANTLY PMN FEW SQUAMOUS EPITHELIAL CELLS PRESENT FEW GRAM POSITIVE COCCI IN PAIRS IN CHAINS IN CLUSTERS FEW GRAM NEGATIVE RODS Performed at [facility]    Culture   Final    NORMAL OROPHARYNGEAL FLORA Performed at [facility]    Report Status 01/10/2015 FINAL  Final  Culture, blood (routine x 2)     Status: None (Preliminary result)   Collection Time: 01/08/15  5:05 PM  Result Value Ref Range Status   Specimen Description BLOOD RIGHT ARM  Final   Special Requests BOTTLES DRAWN AEROBIC ONLY 6CC  Final   Culture   Final           BLOOD CULTURE RECEIVED NO GROWTH TO DATE CULTURE WILL BE HELD FOR 5 DAYS BEFORE ISSUING A FINAL NEGATIVE REPORT Performed at [facility]    Report Status PENDING  Incomplete  Culture, blood (routine x 2)     Status: None (Preliminary result)   Collection Time: 01/08/15  5:15 PM  Result Value Ref Range Status   Specimen Description BLOOD RIGHT HAND  Final   Special Requests BOTTLES  DRAWN AEROBIC ONLY 10CC  Final   Culture   Final           BLOOD CULTURE RECEIVED NO GROWTH TO DATE CULTURE WILL BE HELD FOR 5 DAYS BEFORE ISSUING A FINAL NEGATIVE REPORT Performed at [facility]    Report Status PENDING  Incomplete  Respiratory virus panel (routine influenza)     Status: None  Collection Time: 01/08/15  5:42 PM  Result Value Ref Range Status   Source - RVPAN NASAL SWAB  Corrected   Respiratory Syncytial Virus A Negative Negative Final   Respiratory Syncytial Virus B Negative Negative Final   Influenza A Negative Negative Final   Influenza B Negative Negative Final   Parainfluenza 1 Negative Negative Final   Parainfluenza 2 Negative Negative Final   Parainfluenza 3 Negative Negative Final   Metapneumovirus Negative Negative Final   Rhinovirus Negative Negative Final   Adenovirus Negative Negative Final    Comment: (NOTE) Performed At: [facility] [address] [place], [place] [id number] Mila HomerHancock William F MD NW:[id number]:[phone]   Surgical pcr screen     Status: Abnormal   Collection Time: 01/10/15  5:15 AM  Result Value Ref Range Status   MRSA, PCR POSITIVE (A) NEGATIVE Final   Staphylococcus aureus POSITIVE (A) NEGATIVE Final    Comment:        The Xpert SA Assay (FDA approved for NASAL specimens in patients over 25 years of age), is one component of a comprehensive surveillance program.  Test performance has been validated by [facility] for patients greater than or equal to 25 year old. It is not intended to diagnose infection nor to guide or monitor treatment.   AFB culture with smear     Status: None (Preliminary result)   Collection Time: 01/12/15  3:00 PM  Result Value Ref Range Status   Specimen Description BRONCHIAL ALVEOLAR LAVAGE  Final   Special Requests Normal  Final   Acid Fast Smear   Final    NO ACID FAST BACILLI SEEN Performed at [facility]    Culture   Final    CULTURE WILL BE EXAMINED FOR 6 WEEKS BEFORE  ISSUING A FINAL REPORT Performed at [facility]    Report Status PENDING  Incomplete  Culture, bal-quantitative     Status: None (Preliminary result)   Collection Time: 01/12/15  3:00 PM  Result Value Ref Range Status   Specimen Description BRONCHIAL ALVEOLAR LAVAGE  Final   Special Requests Normal  Final   Gram Stain   Final    NO WBC SEEN NO SQUAMOUS EPITHELIAL CELLS SEEN NO ORGANISMS SEEN Performed at [facility]    Colony Count PENDING  Incomplete   Culture   Final    NO GROWTH 1 DAY Performed at [facility]    Report Status PENDING  Incomplete  Fungus Culture with Smear     Status: None (Preliminary result)   Collection Time: 01/12/15  3:00 PM  Result Value Ref Range Status   Specimen Description BRONCHIAL ALVEOLAR LAVAGE  Final   Special Requests Normal  Final   Fungal Smear   Final    NO YEAST OR FUNGAL ELEMENTS SEEN Performed at [facility]    Culture   Final    CULTURE IN PROGRESS FOR FOUR WEEKS Performed at [facility]    Report Status PENDING  Incomplete     Studies: Dg Chest 2 View  01/14/2015   CLINICAL DATA:  Shortness of breath, infiltrate  EXAM: CHEST  2 VIEW  COMPARISON:  01/12/2015  FINDINGS: Cardiomediastinal silhouette is stable. Stable bilateral basilar and left midlung patchy airspace opacities. No pulmonary edema. No pneumothorax.  IMPRESSION: No active cardiopulmonary disease. Stable bilateral basilar and left midlung patchy airspace opacities.   Electronically Signed   By: Natasha MeadLiviu  Pop M.D.   On: 01/14/2015 08:10   Dg  Chest Port 1 View  01/12/2015   CLINICAL DATA:  Right-sided bronchoscopy with biopsy today.  EXAM: PORTABLE CHEST - 1 VIEW  COMPARISON:  01/12/2015 at 124 p.m., along with a others including 01/07/2015.  FINDINGS: Low lung volumes are present, causing crowding of the pulmonary vasculature. Essentially stable bibasilar airspace opacities compared to the exam from early this morning. No  appreciable pneumothorax. No new pleural fluid collection identified. Cardiac and mediastinal margins appear normal.  IMPRESSION: 1. No pneumothorax or pleural effusion, status post bronchoscopy with biopsy. 2. Stable bibasilar airspace opacities.   Electronically Signed   By: Gaylyn Rong M.D.   On: 01/12/2015 16:19   Dg C-arm Bronchoscopy  01/12/2015   CLINICAL DATA:    C-ARM BRONCHOSCOPY  Fluoroscopy was utilized by the requesting physician.  No radiographic  interpretation.     Scheduled Meds: . benzonatate  100 mg Oral Q8H  . Chlorhexidine Gluconate Cloth  6 each Topical Q0600  . dextromethorphan-guaiFENesin  2 tablet Oral BID  . feeding supplement (ENSURE COMPLETE)  237 mL Oral BID BM  . heparin subcutaneous  5,000 Units Subcutaneous 3 times per day  . mupirocin ointment  1 application Nasal BID  . pantoprazole  40 mg Oral BID AC  . predniSONE  60 mg Oral Q breakfast    Time spent 25 mins    Demario Faniel M.D. [facility] 01/14/2015, 2:30 PM  Pager: [phone]

## 2015-01-14 NOTE — Progress Notes (Signed)
PULMONARY  / CRITICAL CARE MEDICINE CONSULTATION   Name: Kayla [name] MRN: [id number] DOB: 29-Sep-1991    ADMISSION DATE:  01/07/2015 CONSULTATION DATE: 01/09/2015  REQUESTING CLINICIAN: Dr. Algis Liming PRIMARY SERVICE: TRH  CHIEF COMPLAINT:  FUO/pulmonary infiltrates present since at least 09/18/14   BRIEF PATIENT DESCRIPTION:  24 yo female presented with fever, cough, dyspnea, and chest pain with pulmonary infiltrates on CXR.  Found to have positive SSa, SSb antibodies.  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  Denies hemoptysis.  VITAL SIGNS: Temp:  [97.5 F (36.4 C)-98.4 F (36.9 C)] 98.4 F (36.9 C) (02/24 1000) Pulse Rate:  [66-92] 92 (02/24 1000) Resp:  [17-20] 20 (02/24 1000) BP: (103-120)/(60-77) 117/60 mmHg (02/24 1000) SpO2:  [93 %-100 %] 100 % (02/24 1000) Weight:  [141 lb 1.6 oz (64.003 kg)] 141 lb 1.6 oz (64.003 kg) (02/23 2151) INTAKE / OUTPUT: Intake/Output      02/23 0701 - 02/24 0700 02/24 0701 - 02/25 0700   P.O. 480 240   Total Intake(mL/kg) 480 (7.5) 240 (3.7)   Urine (mL/kg/hr)  800 (2.9)   Total Output   800   Net +480 -560          PHYSICAL EXAMINATION: General: sitting in bed Neuro: normal strength HEENT: no sinus tenderness Cardiovascular: regular Lungs: no wheeze Abdomen: non tender Musculoskeletal: no edema Skin:  Platches of discolored skin on lateral arms bilaterally and malar area  LABS:  CBC  Recent Labs Lab 01/09/15 0618 01/10/15 0620 01/12/15 0650  WBC  5.4 4.9 8.0  HGB 11.0* 11.3* 12.0  HCT 33.8* 34.4* 36.2  PLT 236 238 383   BMET  Recent Labs Lab 01/09/15 0618 01/10/15 0620 01/12/15 0650  NA 135 139 137  K 3.3* 3.9 3.6  CL 106 111 108  CO2 '26 22 22  ' BUN <5* <5* 10  CREATININE 0.60 0.46* 0.54  GLUCOSE 96 90 138*   Electrolytes  Recent Labs Lab 01/09/15 0618 01/10/15 0620 01/12/15 0650  CALCIUM 7.6* 8.4 8.6   Sepsis Markers  Recent Labs Lab 01/07/15 1458 01/08/15 1705 01/08/15 1715 01/08/15 1939 01/10/15 0620 01/12/15 0650  LATICACIDVEN 1.05  --  1.1 1.8  --   --   PROCALCITON  --  <0.10  --   --  <0.10 <0.10   Liver Enzymes  Recent Labs Lab 01/10/15 0620  AST 78*  ALT 34  ALKPHOS 73  BILITOT 0.5  ALBUMIN 2.6*   Glucose  Recent Labs Lab 01/09/15 1949  GLUCAP 111*    Imaging Dg Chest 2 View  01/14/2015   CLINICAL DATA:  Shortness of breath, infiltrate  EXAM: CHEST  2 VIEW  COMPARISON:  01/12/2015  FINDINGS: Cardiomediastinal silhouette is stable. Stable bilateral basilar and left midlung patchy airspace opacities. No pulmonary edema. No pneumothorax.  IMPRESSION: No active cardiopulmonary disease. Stable bilateral basilar and left midlung patchy airspace opacities.   Electronically Signed   By: Lahoma Crocker M.D.   On: 01/14/2015 08:10   Dg Chest [facility]  01/12/2015   CLINICAL DATA:  Right-sided bronchoscopy with biopsy today.  EXAM: PORTABLE CHEST - 1 VIEW  COMPARISON:  01/12/2015 at 124 p.m., along with a others including 01/07/2015.  FINDINGS: Low lung volumes are present, causing crowding of the pulmonary vasculature. Essentially stable bibasilar airspace opacities compared to the exam from early this morning. No appreciable pneumothorax. No new pleural fluid collection identified. Cardiac and mediastinal margins appear normal.  IMPRESSION: 1. No pneumothorax or pleural effusion, status post bronchoscopy with biopsy. 2. Stable bibasilar airspace opacities.   Electronically Signed   By: Van Clines M.D.   On: 01/12/2015 16:19   Dg C-arm Bronchoscopy  01/12/2015   CLINICAL DATA:    C-ARM BRONCHOSCOPY  Fluoroscopy was utilized by the requesting physician.  No radiographic  interpretation.     ASSESSMENT / PLAN:  Dyspnea, fever, pulmonary infiltrates with positive SSa, SSb antibodies >> concern for ILD. P: Change to prednisone 60 mg daily on 2/24 >> keep on this dose until she has outpt pulmonary follow up F/u bronchoscopy cx's results from 2/22 F/u ANA from 2/22  Summary: If she tolerates transition to oral prednisone, then she would likely be ready for d/c home on 2/25.  She has outpt pulmonary appointment schedule with Dr. Chase Caller on 02/06/15.  Chesley Mires, MD [facility] 01/14/2015, 11:15 AM Pager:  [phone] After 3pm call: [phone]

## 2015-01-14 NOTE — Progress Notes (Signed)
[name], female   DOB: 01/17/1991, 24 y.o.   MRN: [id number]         [facility]    Date of Admission:  01/07/2015     Active Problems:   Pulmonary infiltrates   Protein-calorie malnutrition, severe   Unintentional weight loss   Malar rash   Alopecia   Hemoptysis   Myalgia   Arthralgia   Fatigue   Chest pain   Community acquired pneumonia   Fever, unknown origin   . benzonatate  100 mg Oral Q8H  . Chlorhexidine Gluconate Cloth  6 each Topical Q0600  . dextromethorphan-guaiFENesin  2 tablet Oral BID  . feeding supplement (ENSURE COMPLETE)  237 mL Oral BID BM  . heparin subcutaneous  5,000 Units Subcutaneous 3 times per day  . mupirocin ointment  1 application Nasal BID  . pantoprazole  40 mg Oral BID AC  . predniSONE  60 mg Oral Q breakfast    Subjective: She is feeling better. She is not as weak and has noted some improvement in her cough. She has not had any recent hemoptysis. She is not sleeping well and has some mild headaches.  Review of Systems: Pertinent items are noted in HPI.  Past Medical History  Diagnosis Date  . Pneumonia 08/2014  . CAP (community acquired pneumonia) 01/07/2015  . GERD (gastroesophageal reflux disease)   . Daily headache     "sometimes" (01/08/2015)  . Arthritis     "hands and legs" (01/08/2015)    History  Substance Use Topics  . Smoking status: Former Smoker -- 0.10 packs/day for 5 years    Types: Cigarettes    Quit date: 11/20/2014  . Smokeless tobacco: Never Used  . Alcohol Use: Yes     Comment: 01/08/2015 "last drink was New Year's Eve"    History reviewed. No pertinent family history. Allergies  Allergen Reactions  . Zithromax [Azithromycin] Cough    OBJECTIVE: Blood pressure 117/60, pulse 92, temperature 98.4 F (36.9 C), temperature source Oral, resp. rate 20, height  (1.626 m), weight 141 lb 1.6 oz (64.003 kg), last menstrual period 12/27/2014, SpO2 100 %. General:  She is watching television. She is in no distress Skin: Her rashes are improving. Her mucositis is improving Lungs: No change in posterior, bibasilar crackles Cor: Regular S1 and S2 with no murmurs   Lab Results Lab Results  Component Value Date   WBC 8.0 01/12/2015   HGB 12.0 01/12/2015   HCT 36.2 01/12/2015   MCV 80.3 01/12/2015   PLT 383 01/12/2015    Lab Results  Component Value Date   CREATININE 0.54 01/12/2015   BUN 10 01/12/2015   NA 137 01/12/2015   K 3.6 01/12/2015   CL 108 01/12/2015   CO2 22 01/12/2015    Lab Results  Component Value Date   ALT 34 01/10/2015   AST 78* 01/10/2015   ALKPHOS 73 01/10/2015   BILITOT 0.5 01/10/2015     Microbiology: Recent Results (from the past 240 hour(s))  Culture, blood (routine x 2)     Status: None   Collection Time: 01/07/15  8:58 PM  Result Value Ref Range Status   Specimen Description BLOOD RIGHT ARM  Final   Special Requests BOTTLES DRAWN AEROBIC AND ANAEROBIC 10CC  Final   Culture   Final    NO GROWTH 5 DAYS Performed at [facility]    Report Status 01/14/2015 FINAL  Final  Culture, blood (routine  x 2)     Status: None   Collection Time: 01/07/15  9:03 PM  Result Value Ref Range Status   Specimen Description BLOOD RIGHT HAND  Final   Special Requests BOTTLES DRAWN AEROBIC ONLY 10CC  Final   Culture   Final    NO GROWTH 5 DAYS Note: Culture results may be compromised due to an excessive volume of blood received in culture bottles. Performed at [facility]    Report Status 01/14/2015 FINAL  Final  Culture, sputum-assessment     Status: None   Collection Time: 01/07/15  9:12 PM  Result Value Ref Range Status   Specimen Description SPUTUM  Final   Special Requests NONE  Final   Sputum evaluation   Final    THIS SPECIMEN IS ACCEPTABLE. RESPIRATORY CULTURE REPORT TO FOLLOW.   Report Status 01/08/2015 FINAL  Final  Culture, respiratory (NON-Expectorated)     Status: None   Collection Time:  01/07/15  9:12 PM  Result Value Ref Range Status   Specimen Description SPUTUM  Final   Special Requests NONE  Final   Gram Stain   Final    MODERATE WBC PRESENT, PREDOMINANTLY PMN FEW SQUAMOUS EPITHELIAL CELLS PRESENT FEW GRAM POSITIVE COCCI IN PAIRS IN CHAINS IN CLUSTERS FEW GRAM NEGATIVE RODS Performed at [facility]    Culture   Final    NORMAL OROPHARYNGEAL FLORA Performed at [facility]    Report Status 01/10/2015 FINAL  Final  Culture, blood (routine x 2)     Status: None (Preliminary result)   Collection Time: 01/08/15  5:05 PM  Result Value Ref Range Status   Specimen Description BLOOD RIGHT ARM  Final   Special Requests BOTTLES DRAWN AEROBIC ONLY 6CC  Final   Culture   Final           BLOOD CULTURE RECEIVED NO GROWTH TO DATE CULTURE WILL BE HELD FOR 5 DAYS BEFORE ISSUING A FINAL NEGATIVE REPORT Performed at [facility]    Report Status PENDING  Incomplete  Culture, blood (routine x 2)     Status: None (Preliminary result)   Collection Time: 01/08/15  5:15 PM  Result Value Ref Range Status   Specimen Description BLOOD RIGHT HAND  Final   Special Requests BOTTLES DRAWN AEROBIC ONLY 10CC  Final   Culture   Final           BLOOD CULTURE RECEIVED NO GROWTH TO DATE CULTURE WILL BE HELD FOR 5 DAYS BEFORE ISSUING A FINAL NEGATIVE REPORT Performed at [facility]    Report Status PENDING  Incomplete  Respiratory virus panel (routine influenza)     Status: None   Collection Time: 01/08/15  5:42 PM  Result Value Ref Range Status   Source - RVPAN NASAL SWAB  Corrected   Respiratory Syncytial Virus A Negative Negative Final   Respiratory Syncytial Virus B Negative Negative Final   Influenza A Negative Negative Final   Influenza B Negative Negative Final   Parainfluenza 1 Negative Negative Final   Parainfluenza 2 Negative Negative Final   Parainfluenza 3 Negative Negative Final   Metapneumovirus Negative Negative Final   Rhinovirus  Negative Negative Final   Adenovirus Negative Negative Final    Comment: (NOTE) Performed At: [facility] [address] [place], [place] [id number] Mila HomerHancock William F MD WU:[id number]:[phone]   Surgical pcr screen     Status: Abnormal   Collection Time: 01/10/15  5:15 AM  Result Value Ref  Range Status   MRSA, PCR POSITIVE (A) NEGATIVE Final   Staphylococcus aureus POSITIVE (A) NEGATIVE Final    Comment:        The Xpert SA Assay (FDA approved for NASAL specimens in patients over 58 years of age), is one component of a comprehensive surveillance program.  Test performance has been validated by [facility] for patients greater than or equal to 35 year old. It is not intended to diagnose infection nor to guide or monitor treatment.   AFB culture with smear     Status: None (Preliminary result)   Collection Time: 01/12/15  3:00 PM  Result Value Ref Range Status   Specimen Description BRONCHIAL ALVEOLAR LAVAGE  Final   Special Requests Normal  Final   Acid Fast Smear   Final    NO ACID FAST BACILLI SEEN Performed at [facility]    Culture   Final    CULTURE WILL BE EXAMINED FOR 6 WEEKS BEFORE ISSUING A FINAL REPORT Performed at [facility]    Report Status PENDING  Incomplete  Culture, bal-quantitative     Status: None (Preliminary result)   Collection Time: 01/12/15  3:00 PM  Result Value Ref Range Status   Specimen Description BRONCHIAL ALVEOLAR LAVAGE  Final   Special Requests Normal  Final   Gram Stain   Final    NO WBC SEEN NO SQUAMOUS EPITHELIAL CELLS SEEN NO ORGANISMS SEEN Performed at [facility] PENDING  Incomplete   Culture   Final    NO GROWTH 1 DAY Performed at [facility]    Report Status PENDING  Incomplete  Fungus Culture with Smear     Status: None (Preliminary result)   Collection Time: 01/12/15  3:00 PM  Result Value Ref Range Status   Specimen Description BRONCHIAL ALVEOLAR LAVAGE  Final    Special Requests Normal  Final   Fungal Smear   Final    NO YEAST OR FUNGAL ELEMENTS SEEN Performed at [facility]    Culture   Final    CULTURE IN PROGRESS FOR FOUR WEEKS Performed at [facility]    Report Status PENDING  Incomplete   Transbronchial lung biopsy pathology report: Normal lung tissue  Assessment: She is improving on steroid therapy for 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 I agree with conversion to oral prednisone and a slow outpatient taper. I do not see any evidence of active infection and would continue observation off of antibiotics.  Plan: 1. Continue observation off of antibiotics 2. I will sign off now  Cliffton Asters, MD [facility] [phone] pager   [phone] cell 01/14/2015, 12:23 PM

## 2015-01-15 DIAGNOSIS — M791 Myalgia: Secondary | ICD-10-CM

## 2015-01-15 DIAGNOSIS — L659 Nonscarring hair loss, unspecified: Secondary | ICD-10-CM

## 2015-01-15 DIAGNOSIS — E43 Unspecified severe protein-calorie malnutrition: Secondary | ICD-10-CM

## 2015-01-15 LAB — CULTURE, BLOOD (ROUTINE X 2)
Culture: NO GROWTH
Culture: NO GROWTH

## 2015-01-15 LAB — CULTURE, BAL-QUANTITATIVE W GRAM STAIN: Colony Count: 5000

## 2015-01-15 LAB — CULTURE, BAL-QUANTITATIVE
GRAM STAIN: NONE SEEN
SPECIAL REQUESTS: NORMAL

## 2015-01-15 MED ORDER — PANTOPRAZOLE SODIUM 40 MG PO TBEC: 40.0000 mg | DELAYED_RELEASE_TABLET | Freq: Two times a day (BID) | ORAL | Status: DC

## 2015-01-15 MED ORDER — PREDNISONE 20 MG PO TABS: 60.0000 mg | ORAL_TABLET | Freq: Every day | ORAL | Status: DC

## 2015-01-15 MED ORDER — DIPHENHYDRAMINE HCL 25 MG PO CAPS: 25.0000 mg | ORAL_CAPSULE | Freq: Four times a day (QID) | ORAL | Status: DC | PRN

## 2015-01-15 NOTE — Progress Notes (Signed)
O2 sat 90% RA after ambulating entire length of hall heart rate 137

## 2015-01-15 NOTE — Discharge Summary (Signed)
Physician Discharge Summary  [name] MRN: [id number] DOB/AGE: [birth year] 25 y.o.  Admit date: 01/07/2015 Discharge date: 01/15/2015  Primary Care Physician:  No PCP Per Patient  Discharge Diagnoses:    . newly diagnosed interstitial lung disease,  positive SSa, SSb antibodies   Pulmonary infiltrates . Protein-calorie malnutrition, severe . Malar rash . Alopecia . Hemoptysis . Myalgia . Arthralgia . Fatigue  Consults:  Pulmonology, Dr. Craige Cotta Infectious disease, Dr. Orvan Falconer   Recommendations for Outpatient Follow-up:  F/u bronchoscopy AFB, fungal cx from 2/22   Patient needs a referral to rheumatology outpatient. Case manager was consulted and provided the patient with a list of PCPs in network with her insurance.   DIET: Regular diet    Allergies:   Allergies  Allergen Reactions  . Zithromax [Azithromycin] Cough     Discharge Medications:   Medication List    STOP taking these medications        azithromycin 250 MG tablet  Commonly known as:  ZITHROMAX     benzonatate 100 MG capsule  Commonly known as:  TESSALON      TAKE these medications        diphenhydrAMINE 25 mg capsule  Commonly known as:  BENADRYL  Take 1 capsule (25 mg total) by mouth every 6 (six) hours as needed for itching (available OTC).     ibuprofen 200 MG tablet  Commonly known as:  ADVIL,MOTRIN  Take 600 mg by mouth every 6 (six) hours as needed for mild pain.     MULTIVITAMIN PO  Take 1 tablet by mouth daily.     pantoprazole 40 MG tablet  Commonly known as:  PROTONIX  Take 1 tablet (40 mg total) by mouth 2 (two) times daily before a meal.     predniSONE 20 MG tablet  Commonly known as:  DELTASONE  Take 3 tablets (60 mg total) by mouth daily with breakfast.         Brief H and P: Per Dr. Blake Divine, For complete details please refer to admission H and P, but in brief Kayla [name] is a 24 y.o. female with no sig prior history comes in for sob since 2  weeks, associated with chest pain Worsens with coughing and taking a deep breath. On arrival to ED. She was found to have multifocal pneumonia. She also reported subjective low grade fevers, chills and night sweats. She reported weight loss of 15lbs in 2 weeks, unintentional. She also reported loss of appetite and occasional constipation. She underwent CT angio of the chest in ED and was found to have multifocal pneumonia with cystic areas and fibrosis. She reports all her symptoms started from October 2015, when she was first diagnosed with pneumonia. She was referred to medical service for evaluation and management of CAP.    Hospital Course:  24 y.o. female with no significant past medical history comes in for sob, palpitations. She was found to have multifocal pneumonia a nd ILD. ID and pulmonology consulted. She underwent bronchoscopy on 2/22 and biopsies were taken from RML. If biopsies are not diagnostic, she will need VATS biopsy. She was started on IV levaquin on 2/17 and discontinued on 2/23 by ID. IV steroids were started by pulmonology on 2/20 , her breathing has improved after starting steroids.   Acute respiratory failure probably secondary to auto immune process and Community acquired pneumonia.  - She was started on on IV levaquin on 2/17 and discontinued on 2/23rd. ID and pulmonology was consulted. Patient  underwent bronchoscopy on 2/22 and biopsies and bronchial washings were taken and sent for analysis, including BAL afb and fungal cultures, results are pending. Autoimmune workup was ordered and showed Positive SSa, SSb concerning for ILD. RML bronchial biopsies were negative for malignancy or atypical cells. Pulmonology recommended prednisone 60 mg daily. Antibiotics were discontinued. Patient has an appointment with Dr. 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 Sickle cell screen negative - H/H stable  Malar rash: Auto immune work up and outpatient  follow up with rheumatology recommended, improving with steroids. ANA, c-ANCA, p-ANCA, complement levels within normal limits but Positive SSa, SSb. Her joint pains have also improved. Case management consult was obtained and patient was provided with a list of PCPs with her insurance network. She needs PCP referral to rheumatology.  Day of Discharge BP 128/74 mmHg  Pulse 137  Temp(Src) 98.4 F (36.9 C) (Oral)  Resp 18  Ht 5\' 4"  (1.626 m)  Wt 64.864 kg (143 lb)  BMI 24.53 kg/m2  SpO2 90%  LMP 12/27/2014  Physical Exam: General: Alert and awake oriented x3 not in any acute distress. HEENT: anicteric sclera, pupils reactive to light and accommodation CVS: S1-S2 clear no murmur rubs or gallops Chest: clear to auscultation bilaterally, no wheezing rales or rhonchi Abdomen: soft nontender, nondistended, normal bowel sounds Extremities: no cyanosis, clubbing or edema noted bilaterally Neuro: Cranial nerves II-XII intact, no focal neurological deficits   The results of significant diagnostics from this hospitalization (including imaging, microbiology, ancillary and laboratory) are listed below for reference.    LAB RESULTS: Basic Metabolic Panel:  Recent Labs Lab 01/10/15 0620 01/12/15 0650  NA 139 137  K 3.9 3.6  CL 111 108  CO2 22 22  GLUCOSE 90 138*  BUN <5* 10  CREATININE 0.46* 0.54  CALCIUM 8.4 8.6   Liver Function Tests:  Recent Labs Lab 01/10/15 0620  AST 78*  ALT 34  ALKPHOS 73  BILITOT 0.5  PROT 6.3  ALBUMIN 2.6*   No results for input(s): LIPASE, AMYLASE in the last 168 hours. No results for input(s): AMMONIA in the last 168 hours. CBC:  Recent Labs Lab 01/10/15 0620 01/12/15 0650  WBC 4.9 8.0  HGB 11.3* 12.0  HCT 34.4* 36.2  MCV 80.9 80.3  PLT 238 383   Cardiac Enzymes: No results for input(s): CKTOTAL, CKMB, CKMBINDEX, TROPONINI in the last 168 hours. BNP: Invalid input(s): POCBNP CBG:  Recent Labs Lab 01/09/15 1949  GLUCAP 111*     Significant Diagnostic Studies:  Dg Chest 2 View  01/07/2015   CLINICAL DATA:  Chest pain, cough for 2 days  EXAM: CHEST  2 VIEW  COMPARISON:  09/18/2014  FINDINGS: Cardiomediastinal silhouette is stable. Streaky bilateral lower lobe patchy infiltrates are noted highly suspicious for recurrent multifocal pneumonia. Follow-up to resolution after appropriate treatment recommended. No pulmonary edema.  IMPRESSION: Streaky bilateral lower lobe patchy infiltrates suspicious for recurrent multifocal pneumonia. Follow-up to resolution after appropriate treatment is recommended.   Electronically Signed   By: Natasha MeadLiviu  Pop M.D.   On: 01/07/2015 13:37   Ct Angio Chest Pe W/cm &/or Wo Cm  01/07/2015   CLINICAL DATA:  Multi focal infiltrates seen on xray, concern for PE 2 days of cough, SOB, chest pain, tachycardia and chills x 2 days and worsening. Hx of Pneumonia 08/2014. No hx of CA. No chest/lung surgeries.  EXAM: CT ANGIOGRAPHY CHEST WITH CONTRAST  TECHNIQUE: Multidetector CT imaging of the chest was performed using the  standard protocol during bolus administration of intravenous contrast. Multiplanar CT image reconstructions and MIPs were obtained to evaluate the vascular anatomy.  CONTRAST:  80mL OMNIPAQUE IOHEXOL 350 MG/ML SOLN  COMPARISON:  Chest x-ray 01/07/2015 and 09/18/2014  FINDINGS: Lungs are somewhat hypoinflated demonstrate patchy subpleural opacification with cystic change most prominent over the mid to upper lungs suggesting patchy areas of fibrosis. There are more patchy confluent areas of consolidation in the mid to lower lungs with air bronchograms. There is no evidence effusion. The airways are otherwise within normal.  Heart is normal in size. There is no evidence of pulmonary embolism. There is a 1 cm precarinal lymph node as well as a few subcentimeter mediastinal and hilar lymph nodes. Remainder of the mediastinal structures are unremarkable.  Images through the upper abdomen are unremarkable.  Remaining bones soft tissues are unremarkable.  Review of the MIP images confirms the above findings.  IMPRESSION: No evidence of pulmonary embolism.  Patchy peripheral areas of airspace consolidation over the mid to lower lungs with air bronchograms likely representing infection. There is patchy subpleural opacification with adjacent cystic change in the mid to upper lung suggesting underlying fibrosis. Part of this process is chronic given the chest x-ray findings dating back to October 2015. Would consider underlying interstitial disease possibly related to connective tissue/autoimmune disease or hypertensivity pneumonitis. Recommend follow-up to resolution as pulmonary consultation may be helpful.   Electronically Signed   By: Elberta Fortis M.D.   On: 01/07/2015 16:26    2D ECHO:   Disposition and Follow-up: Discharge Instructions    Diet general    Complete by:  As directed      Increase activity slowly    Complete by:  As directed             DISPOSITION: Home   DISCHARGE FOLLOW-UP Follow-up Information    Follow up with [facility], MD On 02/06/2015.   Specialty:  Pulmonary Disease   Contact information:   [address] [place] [address] [phone]        Time spent on Discharge: 40 minutes  Signed:   Naviah Belfield M.D. [facility] 01/15/2015, 2:54 PM Pager: [phone]

## 2015-01-15 NOTE — Patient Care Conference (Signed)
Room air sat approx 30 min after O2 removed 97-98% RA at rest.

## 2015-01-15 NOTE — Progress Notes (Signed)
PULMONARY  / CRITICAL CARE MEDICINE CONSULTATION   Name: Kayla [name] MRN: [id number] DOB: 1990/11/25    ADMISSION DATE:  01/07/2015 CONSULTATION DATE: 01/09/2015  REQUESTING CLINICIAN: Dr. Algis Liming PRIMARY SERVICE: TR[facility]  CHIEF COMPLAINT:  FUO/pulmonary infiltrates present since at least 09/18/14   BRIEF PATIENT DESCRIPTION:  24 yo female presented with fever, cough, dyspnea, and chest pain with pulmonary infiltrates on CXR.  Found to have positive SSa, SSb antibodies.  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 No chest pain.  Cough better.  VITAL SIGNS: Temp:  [98 F (36.7 C)-98.4 F (36.9 C)] 98.4 F (36.9 C) (02/25 0937) Pulse Rate:  [59-92] 59 (02/25 0937) Resp:  [17-20] 18 (02/25 0937) BP: (111-128)/(60-88) 128/74 mmHg (02/25 0937) SpO2:  [98 %-100 %] 100 % (02/25 0937) Weight:  [143 lb (64.864 kg)] 143 lb (64.864 kg) (02/24 2104) INTAKE / OUTPUT: Intake/Output      02/24 0701 - 02/25 0700 02/25 0701 - 02/26 0700   P.O. 840 240   Total Intake(mL/kg) 840 (13) 240 (3.7)   Urine (mL/kg/hr) 2200 (1.4) 350 (1.9)   Total Output 2200 350   Net -1360 -110          PHYSICAL EXAMINATION: General: sitting in bed Neuro: normal strength HEENT: no sinus tenderness Cardiovascular: regular Lungs: no wheeze Abdomen: non tender Musculoskeletal: no edema Skin:  Platches of discolored skin on lateral arms bilaterally and malar  area  LABS:  CBC  Recent Labs Lab 01/09/15 0618 01/10/15 0620 01/12/15 0650  WBC 5.4 4.9 8.0  HGB 11.0* 11.3* 12.0  HCT 33.8* 34.4* 36.2  PLT 236 238 383   BMET  Recent Labs Lab 01/09/15 0618 01/10/15 0620 01/12/15 0650  NA 135 139 137  K 3.3* 3.9 3.6  CL 106 111 108  CO2 26 22 22   BUN <5* <5* 10  CREATININE 0.60 0.46* 0.54  GLUCOSE 96 90 138*   Electrolytes  Recent Labs Lab 01/09/15 0618 01/10/15 0620 01/12/15 0650  CALCIUM 7.6* 8.4 8.6   Sepsis Markers  Recent Labs Lab 01/08/15 1705 01/08/15 1715 01/08/15 1939 01/10/15 0620 01/12/15 0650  LATICACIDVEN  --  1.1 1.8  --   --   PROCALCITON <0.10  --   --  <0.10 <0.10   Liver Enzymes  Recent Labs Lab 01/10/15 0620  AST 78*  ALT 34  ALKPHOS 73  BILITOT 0.5  ALBUMIN 2.6*   Glucose  Recent Labs Lab 01/09/15 1949  GLUCAP 111*    Imaging Dg Chest 2 View  01/14/2015   CLINICAL DATA:  Shortness of breath, infiltrate  EXAM: CHEST  2 VIEW  COMPARISON:  01/12/2015  FINDINGS: Cardiomediastinal silhouette is stable. Stable bilateral basilar and left midlung patchy airspace opacities. No pulmonary edema. No pneumothorax.  IMPRESSION: No active cardiopulmonary disease. Stable bilateral basilar and left midlung patchy airspace opacities.   Electronically Signed   By: Lahoma Crocker M.D.   On: 01/14/2015 08:10    ASSESSMENT /  PLAN:  Dyspnea, fever, pulmonary infiltrates with positive SSa, SSb antibodies >> concern for ILD. P: Change to prednisone 60 mg daily on 2/24 >> keep on this dose until she has outpt pulmonary follow up F/u bronchoscopy AFB, fungal cx from 2/22  Summary: Okay for d/c home from pulmonary standpoint.  She has outpt pulmonary appointment schedule with Dr. Chase Caller on 02/06/15.  PCCM will sign off.  Please call if additional help needed while she is in hospital.  Chesley Mires, MD [place] 01/15/2015, 9:49 AM Pager:  [phone] After 3pm call:  [phone]

## 2015-01-15 NOTE — Progress Notes (Signed)
Pt RA sat 98% while resting and 90% after ambulating entire length of hall hr 137.  Dr. Isidoro Donningai T/O re-check heart rate if normal ok to discharge.  Heart rate re-check 98 RA sat 94&%

## 2015-01-29 ENCOUNTER — Ambulatory Visit (INDEPENDENT_AMBULATORY_CARE_PROVIDER_SITE_OTHER): Payer: No Typology Code available for payment source | Admitting: Women's Health

## 2015-01-29 ENCOUNTER — Encounter: Payer: Self-pay | Admitting: Women's Health

## 2015-01-29 ENCOUNTER — Other Ambulatory Visit (HOSPITAL_COMMUNITY)
Admission: RE | Admit: 2015-01-29 | Discharge: 2015-01-29 | Disposition: A | Discharge status: Home / Self Care | Payer: No Typology Code available for payment source | Source: Ambulatory Visit | Attending: Gynecology | Admitting: Gynecology

## 2015-01-29 VITALS — BP 122/82 | Ht 64.0 in | Wt 147.8 lb

## 2015-01-29 DIAGNOSIS — Z113 Encounter for screening for infections with a predominantly sexual mode of transmission: Secondary | ICD-10-CM

## 2015-01-29 DIAGNOSIS — Z01419 Encounter for gynecological examination (general) (routine) without abnormal findings: Secondary | ICD-10-CM | POA: Diagnosis not present

## 2015-01-29 DIAGNOSIS — Z833 Family history of diabetes mellitus: Secondary | ICD-10-CM

## 2015-01-29 DIAGNOSIS — Z23 Encounter for immunization: Secondary | ICD-10-CM | POA: Diagnosis not present

## 2015-01-29 DIAGNOSIS — Z708 Other sex counseling: Secondary | ICD-10-CM

## 2015-01-29 LAB — HIV ANTIBODY (ROUTINE TESTING W REFLEX): HIV 1&2 Ab, 4th Generation: NONREACTIVE

## 2015-01-29 LAB — GLUCOSE, RANDOM: Glucose, Bld: 108 mg/dL — ABNORMAL HIGH (ref 70–99)

## 2015-01-29 LAB — RPR

## 2015-01-29 NOTE — Patient Instructions (Signed)
Health Maintenance Adopting a healthy lifestyle and getting preventive care can go a long way to promote health and wellness. Talk with your health care provider about what schedule of regular examinations is right for you. This is a good chance for you to check in with your provider about disease prevention and staying healthy. In between checkups, there are plenty of things you can do on your own. Experts have done a lot of research about which lifestyle changes and preventive measures are most likely to keep you healthy. Ask your health care provider for more information. WEIGHT AND DIET  Eat a healthy diet  Be sure to include plenty of vegetables, fruits, low-fat dairy products, and lean protein.  Do not eat a lot of foods high in solid fats, added sugars, or salt.  Get regular exercise. This is one of the most important things you can do for your health.  Most adults should exercise for at least 150 minutes each week. The exercise should increase your heart rate and make you sweat (moderate-intensity exercise).  Most adults should also do strengthening exercises at least twice a week. This is in addition to the moderate-intensity exercise.  Maintain a healthy weight  Body mass index (BMI) is a measurement that can be used to identify possible weight problems. It estimates body fat based on height and weight. Your health care provider can help determine your BMI and help you achieve or maintain a healthy weight.  For females 25 years of age and older:   A BMI below 18.5 is considered underweight.  A BMI of 18.5 to 24.9 is normal.  A BMI of 25 to 29.9 is considered overweight.  A BMI of 30 and above is considered obese.  Watch levels of cholesterol and blood lipids  You should start having your blood tested for lipids and cholesterol at 25 years of age, then have this test every 5 years.  You may need to have your cholesterol levels checked more often if:  Your lipid or  cholesterol levels are high.  You are older than 25 years of age.  You are at high risk for heart disease.  CANCER SCREENING   Lung Cancer  Lung cancer screening is recommended for adults 97-92 years old who are at high risk for lung cancer because of a history of smoking.  A yearly low-dose CT scan of the lungs is recommended for people who:  Currently smoke.  Have quit within the past 15 years.  Have at least a 30-pack-year history of smoking. A pack year is smoking an average of one pack of cigarettes a day for 1 year.  Yearly screening should continue until it has been 15 years since you quit.  Yearly screening should stop if you develop a health problem that would prevent you from having lung cancer treatment.  Breast Cancer  Practice breast self-awareness. This means understanding how your breasts normally appear and feel.  It also means doing regular breast self-exams. Let your health care provider know about any changes, no matter how small.  If you are in your 20s or 30s, you should have a clinical breast exam (CBE) by a health care provider every 1-3 years as part of a regular health exam.  If you are 76 or older, have a CBE every year. Also consider having a breast X-ray (mammogram) every year.  If you have a family history of breast cancer, talk to your health care provider about genetic screening.  If you are  at high risk for breast cancer, talk to your health care provider about having an MRI and a mammogram every year.  Breast cancer gene (BRCA) assessment is recommended for women who have family members with BRCA-related cancers. BRCA-related cancers include:  Breast.  Ovarian.  Tubal.  Peritoneal cancers.  Results of the assessment will determine the need for genetic counseling and BRCA1 and BRCA2 testing. Cervical Cancer Routine pelvic examinations to screen for cervical cancer are no longer recommended for nonpregnant women who are considered low  risk for cancer of the pelvic organs (ovaries, uterus, and vagina) and who do not have symptoms. A pelvic examination may be necessary if you have symptoms including those associated with pelvic infections. Ask your health care provider if a screening pelvic exam is right for you.   The Pap test is the screening test for cervical cancer for women who are considered at risk.  If you had a hysterectomy for a problem that was not cancer or a condition that could lead to cancer, then you no longer need Pap tests.  If you are older than 65 years, and you have had normal Pap tests for the past 10 years, you no longer need to have Pap tests.  If you have had past treatment for cervical cancer or a condition that could lead to cancer, you need Pap tests and screening for cancer for at least 20 years after your treatment.  If you no longer get a Pap test, assess your risk factors if they change (such as having a new sexual partner). This can affect whether you should start being screened again.  Some women have medical problems that increase their chance of getting cervical cancer. If this is the case for you, your health care provider may recommend more frequent screening and Pap tests.  The human papillomavirus (HPV) test is another test that may be used for cervical cancer screening. The HPV test looks for the virus that can cause cell changes in the cervix. The cells collected during the Pap test can be tested for HPV.  The HPV test can be used to screen women 30 years of age and older. Getting tested for HPV can extend the interval between normal Pap tests from three to five years.  An HPV test also should be used to screen women of any age who have unclear Pap test results.  After 25 years of age, women should have HPV testing as often as Pap tests.  Colorectal Cancer  This type of cancer can be detected and often prevented.  Routine colorectal cancer screening usually begins at 25 years of  age and continues through 25 years of age.  Your health care provider may recommend screening at an earlier age if you have risk factors for colon cancer.  Your health care provider may also recommend using home test kits to check for hidden blood in the stool.  A small camera at the end of a tube can be used to examine your colon directly (sigmoidoscopy or colonoscopy). This is done to check for the earliest forms of colorectal cancer.  Routine screening usually begins at age 50.  Direct examination of the colon should be repeated every 5-10 years through 25 years of age. However, you may need to be screened more often if early forms of precancerous polyps or small growths are found. Skin Cancer  Check your skin from head to toe regularly.  Tell your health care provider about any new moles or changes in   moles, especially if there is a change in a mole's shape or color.  Also tell your health care provider if you have a mole that is larger than the size of a pencil eraser.  Always use sunscreen. Apply sunscreen liberally and repeatedly throughout the day.  Protect yourself by wearing long sleeves, pants, a wide-brimmed hat, and sunglasses whenever you are outside. HEART DISEASE, DIABETES, AND HIGH BLOOD PRESSURE   Have your blood pressure checked at least every 1-2 years. High blood pressure causes heart disease and increases the risk of stroke.  If you are between 75 years and 42 years old, ask your health care provider if you should take aspirin to prevent strokes.  Have regular diabetes screenings. This involves taking a blood sample to check your fasting blood sugar level.  If you are at a normal weight and have a low risk for diabetes, have this test once every three years after 25 years of age.  If you are overweight and have a high risk for diabetes, consider being tested at a younger age or more often. PREVENTING INFECTION  Hepatitis B  If you have a higher risk for  hepatitis B, you should be screened for this virus. You are considered at high risk for hepatitis B if:  You were born in a country where hepatitis B is common. Ask your health care provider which countries are considered high risk.  Your parents were born in a high-risk country, and you have not been immunized against hepatitis B (hepatitis B vaccine).  You have HIV or AIDS.  You use needles to inject street drugs.  You live with someone who has hepatitis B.  You have had sex with someone who has hepatitis B.  You get hemodialysis treatment.  You take certain medicines for conditions, including cancer, organ transplantation, and autoimmune conditions. Hepatitis C  Blood testing is recommended for:  Everyone born from [birth year] through 1966.  Anyone with known risk factors for hepatitis C. Sexually transmitted infections (STIs)  You should be screened for sexually transmitted infections (STIs) including gonorrhea and chlamydia if:  You are sexually active and are younger than 25 years of age.  You are older than 25 years of age and your health care provider tells you that you are at risk for this type of infection.  Your sexual activity has changed since you were last screened and you are at an increased risk for chlamydia or gonorrhea. Ask your health care provider if you are at risk.  If you do not have HIV, but are at risk, it may be recommended that you take a prescription medicine daily to prevent HIV infection. This is called pre-exposure prophylaxis (PrEP). You are considered at risk if:  You are sexually active and do not regularly use condoms or know the HIV status of your partner(s).  You take drugs by injection.  You are sexually active with a partner who has HIV. Talk with your health care provider about whether you are at high risk of being infected with HIV. If you choose to begin PrEP, you should first be tested for HIV. You should then be tested every 3 months for  as long as you are taking PrEP.  PREGNANCY   If you are premenopausal and you may become pregnant, ask your health care provider about preconception counseling.  If you may become pregnant, take 400 to 800 micrograms (mcg) of folic acid every day.  If you want to prevent pregnancy, talk to your  health care provider about birth control (contraception). OSTEOPOROSIS AND MENOPAUSE   Osteoporosis is a disease in which the bones lose minerals and strength with aging. This can result in serious bone fractures. Your risk for osteoporosis can be identified using a bone density scan.  If you are 65 years of age or older, or if you are at risk for osteoporosis and fractures, ask your health care provider if you should be screened.  Ask your health care provider whether you should take a calcium or vitamin D supplement to lower your risk for osteoporosis.  Menopause may have certain physical symptoms and risks.  Hormone replacement therapy may reduce some of these symptoms and risks. Talk to your health care provider about whether hormone replacement therapy is right for you.  HOME CARE INSTRUCTIONS   Schedule regular health, dental, and eye exams.  Stay current with your immunizations.   Do not use any tobacco products including cigarettes, chewing tobacco, or electronic cigarettes.  If you are pregnant, do not drink alcohol.  If you are breastfeeding, limit how much and how often you drink alcohol.  Limit alcohol intake to no more than 1 drink per day for nonpregnant women. One drink equals 12 ounces of beer, 5 ounces of wine, or 1 ounces of hard liquor.  Do not use street drugs.  Do not share needles.  Ask your health care provider for help if you need support or information about quitting drugs.  Tell your health care provider if you often feel depressed.  Tell your health care provider if you have ever been abused or do not feel safe at home. Document Released: 05/23/2011  Document Revised: 03/24/2014 Document Reviewed: 10/09/2013 ExitCare Patient Information 2015 [employer]. This information is not intended to replace advice given to you by your health care provider. Make sure you discuss any questions you have with your health care provider. Exercise to Stay Healthy Exercise helps you become and stay healthy. EXERCISE IDEAS AND TIPS Choose exercises that:  You enjoy.  Fit into your day. You do not need to exercise really hard to be healthy. You can do exercises at a slow or medium level and stay healthy. You can:  Stretch before and after working out.  Try yoga, Pilates, or tai chi.  Lift weights.  Walk fast, swim, jog, run, climb stairs, bicycle, dance, or rollerskate.  Take aerobic classes. Exercises that burn about 150 calories:  Running 1  miles in 15 minutes.  Playing volleyball for 45 to 60 minutes.  Washing and waxing a car for 45 to 60 minutes.  Playing touch football for 45 minutes.  Walking 1  miles in 35 minutes.  Pushing a stroller 1  miles in 30 minutes.  Playing basketball for 30 minutes.  Raking leaves for 30 minutes.  Bicycling 5 miles in 30 minutes.  Walking 2 miles in 30 minutes.  Dancing for 30 minutes.  Shoveling snow for 15 minutes.  Swimming laps for 20 minutes.  Walking up stairs for 15 minutes.  Bicycling 4 miles in 15 minutes.  Gardening for 30 to 45 minutes.  Jumping rope for 15 minutes.  Washing windows or floors for 45 to 60 minutes. Document Released: 12/10/2010 Document Revised: 01/30/2012 Document Reviewed: 12/10/2010 ExitCare Patient Information 2015 [employer]. This information is not intended to replace advice given to you by your health care provider. Make sure you discuss any questions you have with your health care provider.  
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
## 2015-01-30 LAB — GC/CHLAMYDIA PROBE AMP
CT Probe RNA: NEGATIVE
GC Probe RNA: NEGATIVE

## 2015-02-02 ENCOUNTER — Other Ambulatory Visit: Payer: Self-pay | Admitting: Women's Health

## 2015-02-02 DIAGNOSIS — R7301 Impaired fasting glucose: Secondary | ICD-10-CM

## 2015-02-02 LAB — CYTOLOGY - PAP

## 2015-02-03 ENCOUNTER — Other Ambulatory Visit: Payer: No Typology Code available for payment source

## 2015-02-03 DIAGNOSIS — R7301 Impaired fasting glucose: Secondary | ICD-10-CM

## 2015-02-04 ENCOUNTER — Other Ambulatory Visit: Payer: Self-pay | Admitting: Women's Health

## 2015-02-04 DIAGNOSIS — Z9189 Other specified personal risk factors, not elsewhere classified: Secondary | ICD-10-CM

## 2015-02-04 LAB — HEMOGLOBIN A1C
Hgb A1c MFr Bld: 6 % — ABNORMAL HIGH (ref <5.7)
Mean Plasma Glucose: 126 mg/dL — ABNORMAL HIGH (ref <117)

## 2015-02-06 ENCOUNTER — Encounter: Payer: Self-pay | Admitting: Internal Medicine

## 2015-02-06 ENCOUNTER — Ambulatory Visit (INDEPENDENT_AMBULATORY_CARE_PROVIDER_SITE_OTHER): Payer: No Typology Code available for payment source | Admitting: Internal Medicine

## 2015-02-06 VITALS — BP 116/80 | HR 93 | Ht 64.0 in | Wt 153.6 lb

## 2015-02-06 DIAGNOSIS — J849 Interstitial pulmonary disease, unspecified: Secondary | ICD-10-CM | POA: Insufficient documentation

## 2015-02-06 DIAGNOSIS — M3500 Sicca syndrome, unspecified: Secondary | ICD-10-CM | POA: Insufficient documentation

## 2015-02-06 DIAGNOSIS — R0600 Dyspnea, unspecified: Secondary | ICD-10-CM | POA: Diagnosis not present

## 2015-02-06 NOTE — Progress Notes (Signed)
Subjective:    [name], female    DOB: 1990-11-23, 24 y.o.   MRN: [id number]  HPI    OV 02/06/2015  Chief Complaint  Patient presents with  . Pulmonary Consult    Pt referred by Dr. Waymon Amato for ILD. Pt c/o SOB with and without activity, cough with little mucus production, and midsternal CP when lying down. Pt c/o flaky rash on right upper arm.     24 year old Afro-American female admitted 1 month ago to [facility] with several week history of dyspnea, chest tightness, dry cough and some nonspecific dry rash in her bilateral arms. Evaluation showed scattered groundglass opacities on CT scan of the chest. Bronchoscopy with lavage did not show any infectious organisms. I do not see evidence of a cell count and differential on the lavage. Autoimmune antibody panel was positive for Sjogren's antibodies but otherwise negative and did not see evidence of angiotensin-converting enzyme. She was discharged on 60 mg prednisone per day. She now presents for follow-up. She continues to have some dyspnea and dry cough. She is also reporting dry eyes and increased thirst. The prednisone as helped only somewhat. Overall no decompensation but only some better. The rash in her arms have not disappeared. No new issues.  Walking desaturation test 185 feet 3 laps in the office on room air: did not desat    has a past medical history of CAP (community acquired pneumonia) (01/07/2015); GERD (gastroesophageal reflux disease); Daily headache; and Arthritis.   reports that she quit smoking about 2 months ago. Her smoking use included Cigarettes. She has a .5 pack-year smoking history. She has never used smokeless tobacco.  Past Surgical History  Procedure Laterality Date  . Finger surgery Right 03/2014    "laceration, nerve/artery injury" 2nd digit  . Video bronchoscopy Bilateral 01/12/2015    Procedure: VIDEO BRONCHOSCOPY WITH FLUORO;  Surgeon: Leslye Peer, MD;  [facility]  ENDOSCOPY;  Service: Cardiopulmonary;  Laterality: Bilateral;    Allergies  Allergen Reactions  . Zithromax [Azithromycin] Cough    Immunization History  Administered Date(s) Administered  . HPV 9-valent 01/29/2015  . Influenza,inj,Quad PF,36+ Mos 01/08/2015  . Pneumococcal Polysaccharide-23 01/08/2015    Family History  Problem Relation Age of Onset  . Diabetes Mother   . Diabetes Maternal Aunt   . Diabetes Maternal Grandmother      Current outpatient prescriptions:  .  diphenhydrAMINE (BENADRYL) 25 mg capsule, Take 1 capsule (25 mg total) by mouth every 6 (six) hours as needed for itching (available OTC)., Disp: 30 capsule, Rfl: 0 .  ibuprofen (ADVIL,MOTRIN) 200 MG tablet, Take 600 mg by mouth every 6 (six) hours as needed for mild pain., Disp: , Rfl:  .  Multiple Vitamins-Minerals (MULTIVITAMIN PO), Take 1 tablet by mouth daily., Disp: , Rfl:  .  pantoprazole (PROTONIX) 40 MG tablet, Take 1 tablet (40 mg total) by mouth 2 (two) times daily before a meal., Disp: 60 tablet, Rfl: 4 .  predniSONE (DELTASONE) 20 MG tablet, Take 3 tablets (60 mg total) by mouth daily with breakfast., Disp: 30 tablet, Rfl: 3    Review of Systems  Constitutional: Negative for fever and unexpected weight change.  HENT: Positive for congestion and sinus pressure. Negative for dental problem, ear pain, nosebleeds, postnasal drip, rhinorrhea, sneezing, sore throat and trouble swallowing.   Eyes: Negative for redness and itching.  Respiratory: Positive for cough and shortness of breath. Negative for chest tightness and wheezing.   Cardiovascular: Positive for  chest pain and palpitations. Negative for leg swelling.  Gastrointestinal: Negative for nausea and vomiting.  Genitourinary: Negative for dysuria.  Musculoskeletal: Positive for joint swelling.  Skin: Positive for rash.  Neurological: Positive for headaches.  Hematological: Does not bruise/bleed easily.  Psychiatric/Behavioral: Negative for  dysphoric mood. The patient is not nervous/anxious.        Objective:   Physical Exam  Constitutional: She is oriented to person, place, and time. She appears well-developed and well-nourished. No distress.  Body mass index is 26.35 kg/(m^2).   HENT:  Head: Normocephalic and atraumatic.  Right Ear: External ear normal.  Left Ear: External ear normal.  Mouth/Throat: Oropharynx is clear and moist. No oropharyngeal exudate.  Eyes: Conjunctivae and EOM are normal. Pupils are equal, round, and reactive to light. Right eye exhibits no discharge. Left eye exhibits no discharge. No scleral icterus.  Neck: Normal range of motion. Neck supple. No JVD present. No tracheal deviation present. No thyromegaly present.  Cardiovascular: Normal rate, regular rhythm, normal heart sounds and intact distal pulses.  Exam reveals no gallop and no friction rub.   No murmur heard. Pulmonary/Chest: Effort normal. No respiratory distress. She has no wheezes. She has rales. She exhibits no tenderness.  Social scattered crackles  Abdominal: Soft. Bowel sounds are normal. She exhibits no distension and no mass. There is no tenderness. There is no rebound and no guarding.  Musculoskeletal: Normal range of motion. She exhibits no edema or tenderness.  Lymphadenopathy:    She has no cervical adenopathy.  Neurological: She is alert and oriented to person, place, and time. She has normal reflexes. No cranial nerve deficit. She exhibits normal muscle tone. Coordination normal.  Skin: Skin is warm and dry. No rash noted. She is not diaphoretic. No erythema. No pallor.  Tattoos in the back Stretch mark in the arms Small abrasion due to fall with a raw wound 2-3 cm in the right forearm by the wrist    Psychiatric: She has a normal mood and affect. Her behavior is normal. Judgment and thought content normal.  Vitals reviewed.   Filed Vitals:   02/06/15 1336  BP: 116/80  Pulse: 93  Height: 5\' 4"  (1.626 m)  Weight:  69.673 kg (153 lb 9.6 oz)  SpO2: 98%         Assessment & Plan:     ICD-9-CM ICD-10-CM   1. ILD (interstitial lung disease) 515 J84.9 2D Echocardiogram WITH contrast     CT Chest High Resolution     Pulmonary function test  2. Sjogren's disease 710.2 M35.00 2D Echocardiogram WITH contrast     CT Chest High Resolution     Pulmonary function test  3. Dyspnea 786.09 R06.00 2D Echocardiogram WITH contrast     CT Chest High Resolution     Pulmonary function test   Possible Sjogrens  - she needs to see rheumatologist. Prednisone dose can be reduced a bit I think In autoimmune disease - ILD and pulm htn are 2 common reasons for dyspnea. So far she has not had echo. She will need serial assessment on ILD via PFT/CT  PLAN Do walk test Do ECHO test anytime- rule out pulmonary hypertension - will call with results Cut prednisone down to 40mg  per day and continue Refer Dr Pollyann SavoyShaili Deveshwar rheumatologist   Followup  - HRCT in 3 months  - PFT in 3 months - Return to see me in 3 months  Dr. Kalman ShanMurali Hong Moring, M.D., [facility]P Pulmonary and Critical Care Medicine Staff  Physician [facility]  Pulmonary and Critical Care Pager: [phone], If no answer or between  15:00h - 7:00h: call [phone]  02/13/2015 2:58 AM

## 2015-02-06 NOTE — Patient Instructions (Addendum)
ICD-9-CM ICD-10-CM   1. ILD (interstitial lung disease) 515 J84.9   2. Sjogren's disease 710.2 M35.00   3. Dyspnea 786.09 R06.00    Do walk test Do ECHO test anytime- rule out pulmonary hypertension - will call with results Cut prednisone down to 40mg  per day and continue Refer Dr Pollyann SavoyShaili Deveshwar rheumatologist   Followup  - HRCT in 3 months  - PFT in 3 months - Return to see me in 3 months

## 2015-02-09 LAB — FUNGUS CULTURE W SMEAR
FUNGAL SMEAR: NONE SEEN
Special Requests: NORMAL

## 2015-02-10 ENCOUNTER — Other Ambulatory Visit: Payer: Self-pay

## 2015-02-10 ENCOUNTER — Ambulatory Visit (HOSPITAL_COMMUNITY): Payer: No Typology Code available for payment source | Attending: Cardiology

## 2015-02-10 DIAGNOSIS — R0600 Dyspnea, unspecified: Secondary | ICD-10-CM | POA: Diagnosis present

## 2015-02-10 DIAGNOSIS — M3500 Sicca syndrome, unspecified: Secondary | ICD-10-CM

## 2015-02-10 DIAGNOSIS — J849 Interstitial pulmonary disease, unspecified: Secondary | ICD-10-CM

## 2015-02-10 DIAGNOSIS — Z87891 Personal history of nicotine dependence: Secondary | ICD-10-CM | POA: Insufficient documentation

## 2015-02-10 NOTE — Progress Notes (Signed)
2D Echo completed. 02/10/2015 

## 2015-02-12 ENCOUNTER — Telehealth: Payer: Self-pay | Admitting: Internal Medicine

## 2015-02-12 NOTE — Telephone Encounter (Signed)
Let her know Echo did not show pulmonary hypertension. Formal result is  - Normal LV function; grade 1 diastolic dysfunction; - this is essentially normal

## 2015-02-12 NOTE — Telephone Encounter (Signed)
lmtcb for pt.  

## 2015-02-16 NOTE — Telephone Encounter (Signed)
Spoke with pt and notified of results per Dr. Ramaswamy. Pt verbalized understanding and denied any questions.  

## 2015-02-16 NOTE — Telephone Encounter (Signed)
Pt returning call.Kayla [name] ° °

## 2015-02-24 ENCOUNTER — Telehealth: Payer: Self-pay | Admitting: Internal Medicine

## 2015-02-24 LAB — AFB CULTURE WITH SMEAR (NOT AT ARMC)
ACID FAST SMEAR: NONE SEEN
SPECIAL REQUESTS: NORMAL

## 2015-02-24 MED ORDER — PREDNISONE 20 MG PO TABS: 40.0000 mg | ORAL_TABLET | Freq: Every day | ORAL | Status: DC

## 2015-02-24 NOTE — Telephone Encounter (Signed)
Spoke with pt, she is aware of recs.  Refilled prednisone.  Nothing further needed.

## 2015-02-24 NOTE — Telephone Encounter (Signed)
She is to take prednisone at 40mg  per day - ok to refill  For rest of issue - ER or PCP   Dr. Kalman ShanMurali Doaa Kendzierski, M.D., [facility] Pulmonary and Critical Care Medicine Staff Physician [facility] [place] [facility] Pager: [phone], If no answer or between  15:00h - 7:00h: call [phone]  02/24/2015 12:49 PM

## 2015-02-24 NOTE — Telephone Encounter (Signed)
Pt c/o pain all over body x 4 days. Pt states that the pain is sharp. Requesting a pain pill for this pain.  Advised pt that normally unless pain is related to lungs, pain meds are not given.  Pt also requesting refill of Prednisone. Pt states that she has rashes on arms - looks like sores.  Pt reports having some on back as well.  Rash is bilateral on body.   Please advise Dr Marchelle Gearingamaswamy. Thanks.  Allergies  Allergen Reactions  . Zithromax [Azithromycin] Cough

## 2015-02-27 ENCOUNTER — Telehealth: Payer: Self-pay | Admitting: Internal Medicine

## 2015-02-27 NOTE — Telephone Encounter (Signed)
Left message to call back  

## 2015-03-02 NOTE — Telephone Encounter (Signed)
lmtcb x2 for pt. 

## 2015-03-03 NOTE — Telephone Encounter (Signed)
lmtcb X3 for pt. 

## 2015-03-05 ENCOUNTER — Telehealth: Payer: Self-pay | Admitting: Internal Medicine

## 2015-03-05 NOTE — Telephone Encounter (Signed)
LMTCB x 4 Will close per triage protocol. Nothing further needed.

## 2015-03-05 NOTE — Telephone Encounter (Signed)
lmomtcb x1 

## 2015-03-06 NOTE — Telephone Encounter (Signed)
WE dont have a dx of asthma/copd on her. HAve her come in next weelk to see me/TP. ISsue of delay in seeing rheum can be addressed then

## 2015-03-06 NOTE — Telephone Encounter (Signed)
Spoke with patient, states that she was calling to request nebulizer machine and meds for home treatment. Pt states that she has been having increased cough and SOB x 1-2 weeks and feels that she needs to be doing something at home to treat these flares. Please advise Dr Marchelle Gearingamaswamy. Thanks.

## 2015-03-06 NOTE — Telephone Encounter (Signed)
Called and spoke to pt. Informed pt of the recs per MR. Pt stated she was not able to come in next week when MR is in office. Appt made with MR on 4/26. Noticed TP has sooner openings on Friday. Will call pt Monday (4/18)

## 2015-03-06 NOTE — Telephone Encounter (Signed)
Pt returning call.Kayla [name] ° °

## 2015-03-06 NOTE — Telephone Encounter (Signed)
lmtcb X2 for pt.  

## 2015-03-09 ENCOUNTER — Encounter: Payer: Self-pay | Admitting: Obstetrics & Gynecology

## 2015-03-09 ENCOUNTER — Encounter (HOSPITAL_COMMUNITY): Payer: Self-pay

## 2015-03-09 ENCOUNTER — Ambulatory Visit (INDEPENDENT_AMBULATORY_CARE_PROVIDER_SITE_OTHER): Payer: No Typology Code available for payment source | Admitting: Obstetrics & Gynecology

## 2015-03-09 ENCOUNTER — Emergency Department (HOSPITAL_COMMUNITY)
Admission: EM | Admit: 2015-03-09 | Discharge: 2015-03-09 | Disposition: A | Discharge status: Home / Self Care | Payer: No Typology Code available for payment source | Attending: Emergency Medicine | Admitting: Emergency Medicine

## 2015-03-09 ENCOUNTER — Emergency Department (HOSPITAL_COMMUNITY): Payer: No Typology Code available for payment source

## 2015-03-09 VITALS — BP 139/90 | HR 98 | Temp 98.5°F | Ht 63.0 in | Wt 155.5 lb

## 2015-03-09 DIAGNOSIS — K219 Gastro-esophageal reflux disease without esophagitis: Secondary | ICD-10-CM | POA: Insufficient documentation

## 2015-03-09 DIAGNOSIS — J069 Acute upper respiratory infection, unspecified: Secondary | ICD-10-CM | POA: Insufficient documentation

## 2015-03-09 DIAGNOSIS — Z79899 Other long term (current) drug therapy: Secondary | ICD-10-CM | POA: Insufficient documentation

## 2015-03-09 DIAGNOSIS — Z87891 Personal history of nicotine dependence: Secondary | ICD-10-CM | POA: Diagnosis not present

## 2015-03-09 DIAGNOSIS — Z7952 Long term (current) use of systemic steroids: Secondary | ICD-10-CM | POA: Insufficient documentation

## 2015-03-09 DIAGNOSIS — Z8701 Personal history of pneumonia (recurrent): Secondary | ICD-10-CM | POA: Insufficient documentation

## 2015-03-09 DIAGNOSIS — M159 Polyosteoarthritis, unspecified: Secondary | ICD-10-CM | POA: Insufficient documentation

## 2015-03-09 DIAGNOSIS — M79641 Pain in right hand: Secondary | ICD-10-CM | POA: Diagnosis present

## 2015-03-09 DIAGNOSIS — L03011 Cellulitis of right finger: Secondary | ICD-10-CM | POA: Diagnosis not present

## 2015-03-09 DIAGNOSIS — B373 Candidiasis of vulva and vagina: Secondary | ICD-10-CM | POA: Diagnosis not present

## 2015-03-09 DIAGNOSIS — B3731 Acute candidiasis of vulva and vagina: Secondary | ICD-10-CM

## 2015-03-09 MED ORDER — LIDOCAINE HCL (PF) 1 % IJ SOLN
5.0000 mL | Freq: Once | INTRAMUSCULAR | Status: DC
Start: 2015-03-09 — End: 2015-03-09
  Filled 2015-03-09: qty 5

## 2015-03-09 MED ORDER — LEVOFLOXACIN 500 MG PO TABS
500.0000 mg | ORAL_TABLET | Freq: Once | ORAL | Status: AC
  Administered 2015-03-09: 500 mg via ORAL
  Filled 2015-03-09 (×2): qty 1

## 2015-03-09 MED ORDER — LIDOCAINE HCL (PF) 1 % IJ SOLN
5.0000 mL | Freq: Once | INTRAMUSCULAR | Status: AC
  Administered 2015-03-09: 5 mL
  Filled 2015-03-09: qty 5

## 2015-03-09 MED ORDER — LIDOCAINE HCL 2 % IJ SOLN: 10.0000 mL | Freq: Once | INTRAMUSCULAR | Status: DC

## 2015-03-09 MED ORDER — LEVOFLOXACIN 500 MG PO TABS
500.0000 mg | ORAL_TABLET | Freq: Every day | ORAL | Status: DC
Start: 2015-03-10 — End: 2015-03-17

## 2015-03-09 MED ORDER — FLUCONAZOLE 150 MG PO TABS: 150.0000 mg | ORAL_TABLET | Freq: Once | ORAL | Status: DC

## 2015-03-09 NOTE — Telephone Encounter (Signed)
Called and spoke to pt. Changed appt time to Wednesday 4/20 with TP. Pt stated she was on her way to the ED as her SOB has worsened.   Will forward to MR as FYI.

## 2015-03-09 NOTE — Progress Notes (Signed)
[name] Lacorte, female   DOB: 15-Dec-1990, 24 y.o.   MRN: [id number]  Chief Complaint  Patient presents with  . Gynecologic Exam    vaginal discharge    HPI Kayla [name] Boldman is a 24 y.o. female.  G0P0000 No LMP recorded. Regular menses on no contraception, had pap last month. C/O vaginal discharge and odor, was dx with BV. Pap showed fungal elements.   HPI  Past Medical History  Diagnosis Date  . CAP (community acquired pneumonia) 01/07/2015  . GERD (gastroesophageal reflux disease)   . Daily headache     "sometimes" (01/08/2015)  . Arthritis     "hands and legs" (01/08/2015)    Past Surgical History  Procedure Laterality Date  . Finger surgery Right 03/2014    "laceration, nerve/artery injury" 2nd digit  . Video bronchoscopy Bilateral 01/12/2015    Procedure: VIDEO BRONCHOSCOPY WITH FLUORO;  Surgeon: Leslye Peerobert S Byrum, MD;  [facility];  Service: Cardiopulmonary;  Laterality: Bilateral;    Family History  Problem Relation Age of Onset  . Diabetes Mother   . Diabetes Maternal Aunt   . Diabetes Maternal Grandmother     Social History History  Substance Use Topics  . Smoking status: Former Smoker -- 0.10 packs/day for 5 years    Types: Cigarettes    Quit date: 11/20/2014  . Smokeless tobacco: Never Used  . Alcohol Use: 0.0 oz/week    0 Standard drinks or equivalent per week     Comment: 01/08/2015 "last drink was New Year's Eve"    Allergies  Allergen Reactions  . Zithromax [Azithromycin] Cough    Current Outpatient Prescriptions  Medication Sig Dispense Refill  . diphenhydrAMINE (BENADRYL) 25 mg capsule Take 1 capsule (25 mg total) by mouth every 6 (six) hours as needed for itching (available OTC). 30 capsule 0  . ibuprofen (ADVIL,MOTRIN) 200 MG tablet Take 600 mg by mouth every 6 (six) hours as needed for mild pain.    . Multiple Vitamins-Minerals (MULTIVITAMIN PO) Take 1 tablet by mouth daily.    . pantoprazole (PROTONIX) 40 MG tablet Take 1  tablet (40 mg total) by mouth 2 (two) times daily before a meal. 60 tablet 4  . predniSONE (DELTASONE) 20 MG tablet Take 2 tablets (40 mg total) by mouth daily with breakfast. 60 tablet 3  . fluconazole (DIFLUCAN) 150 MG tablet Take 1 tablet (150 mg total) by mouth once. 1 tablet 1   No current facility-administered medications for this visit.    Review of Systems Review of Systems  Constitutional: Negative.   Genitourinary: Positive for vaginal discharge. Negative for dysuria, vaginal bleeding, menstrual problem and pelvic pain.    Blood pressure 139/90, pulse 98, temperature 98.5 F (36.9 C), temperature source Oral, height 5\' 3"  (1.6 m), weight 155 lb 8 oz (70.534 kg).  Physical Exam Physical Exam  Constitutional: She is oriented to person, place, and time. She appears well-developed. No distress.  Pulmonary/Chest: Effort normal. No respiratory distress.  Genitourinary: Vaginal discharge (white c/w yeast) found.  Cx nl  Neurological: She is alert and oriented to person, place, and time.  Skin: Skin is warm and dry.  Psychiatric: She has a normal mood and affect. Her behavior is normal.    Data Reviewed Pap result  Assessment    Suspect yeast vaginitis wet prep done     Plan    Diflucan 150 mg X1        ARNOLD,JAMES 03/09/2015, 1:19 PM

## 2015-03-09 NOTE — Patient Instructions (Signed)

## 2015-03-09 NOTE — ED Notes (Signed)
Declined W/C at D/C and was escorted to lobby by RN. 

## 2015-03-09 NOTE — ED Notes (Signed)
Pt here for pain to her right thumb two days ago. No known injury. Pt sts she feels like she is breathing heavy. NAD in triage.

## 2015-03-09 NOTE — ED Provider Notes (Signed)
CSN: [id number]     Arrival date & time 03/09/15  1343 History  This chart was scribed for non-physician practitioner, Marlon Pel, PA-C, working with No att. providers found, by Ronney Lion, ED Scribe. This patient was seen in room TR08C/TR08C and the patient's care was started at 4:02 PM.    Chief Complaint  Patient presents with  . Hand Pain  . Cough   The history is provided by the patient. No language interpreter was used.     HPI Comments: Kayla [name] is a 24 y.o. female who presents to the Emergency Department complaining of more difficulty breathing than usual. She also complains of associated cough. Patient was admitted 2 months ago for possible pneumonia and following that was suspected to have Sjogren's. Patient was referred to a specialist who she will see 04/30/15 and until then is taking 60 mg prednisone daily. She denies fever, sore throat, otalgia, or generalized myalgias.  Patient also complains of severe, constant, throbbing pain to her right thumb that began 2 days ago, with mild associated discharge. Patient denies any known trauma or injury to her right hand.   Past Medical History  Diagnosis Date  . CAP (community acquired pneumonia) 01/07/2015  . GERD (gastroesophageal reflux disease)   . Daily headache     "sometimes" (01/08/2015)  . Arthritis     "hands and legs" (01/08/2015)   Past Surgical History  Procedure Laterality Date  . Finger surgery Right 03/2014    "laceration, nerve/artery injury" 2nd digit  . Video bronchoscopy Bilateral 01/12/2015    Procedure: VIDEO BRONCHOSCOPY WITH FLUORO;  Surgeon: Leslye Peer, MD;  Location: [facility] ENDOSCOPY;  Service: Cardiopulmonary;  Laterality: Bilateral;   Family History  Problem Relation Age of Onset  . Diabetes Mother   . Diabetes Maternal Aunt   . Diabetes Maternal Grandmother    History  Substance Use Topics  . Smoking status: Former Smoker -- 0.10 packs/day for 5 years    Types: Cigarettes    Quit date:  11/20/2014  . Smokeless tobacco: Never Used  . Alcohol Use: 0.0 oz/week    0 Standard drinks or equivalent per week     Comment: 01/08/2015 "last drink was New Year's Eve"   OB History    Gravida Para Term Preterm AB TAB SAB Ectopic Multiple Living       Review of Systems  A complete 10 system review of systems was obtained and all systems are negative except as noted in the HPI and PMH.    Allergies  Zithromax  Home Medications   Prior to Admission medications   Medication Sig Start Date End Date Taking? Authorizing Provider  diphenhydrAMINE (BENADRYL) 25 mg capsule Take 1 capsule (25 mg total) by mouth every 6 (six) hours as needed for itching (available OTC). 01/15/15   Ripudeep Jenna Luo, MD  fluconazole (DIFLUCAN) 150 MG tablet Take 1 tablet (150 mg total) by mouth once. 03/09/15   Adam Phenix, MD  ibuprofen (ADVIL,MOTRIN) 200 MG tablet Take 600 mg by mouth every 6 (six) hours as needed for mild pain.    Historical Provider, MD  levofloxacin (LEVAQUIN) 500 MG tablet Take 1 tablet (500 mg total) by mouth daily. 03/10/15   Marlon Pel, PA-C  Multiple Vitamins-Minerals (MULTIVITAMIN PO) Take 1 tablet by mouth daily.    Historical Provider, MD  pantoprazole (PROTONIX) 40 MG tablet Take 1 tablet (40 mg total) by mouth 2 (two) times  daily before a meal. 01/15/15   Ripudeep Jenna LuoK Rai, MD  predniSONE (DELTASONE) 20 MG tablet Take 2 tablets (40 mg total) by mouth daily with breakfast. 02/24/15   Kalman ShanMurali Ramaswamy, MD   BP 133/92 mmHg  Pulse 92  Temp(Src) 97.8 F (36.6 C) (Oral)  Resp 16  Ht 5\' 3"  (1.6 m)  Wt 155 lb 1.6 oz (70.353 kg)  BMI 27.48 kg/m2  SpO2 98%  LMP 02/26/2015 Physical Exam  Constitutional: She is oriented to person, place, and time. She appears well-developed and well-nourished. No distress.  HENT:  Head: Normocephalic and atraumatic.  Mouth/Throat: Uvula is midline and mucous membranes are normal.  Eyes: Conjunctivae and EOM are normal.  Neck: Neck  supple. No tracheal deviation present.  Cardiovascular: Normal rate.   Pulmonary/Chest: Effort normal. No respiratory distress. She has no decreased breath sounds. She has wheezes (mild). She has no rhonchi.  Musculoskeletal: Normal range of motion.  Neurological: She is alert and oriented to person, place, and time.  Skin: Skin is warm and dry.  Psychiatric: She has a normal mood and affect. Her behavior is normal.  Nursing note and vitals reviewed.   ED Course  Procedures (including critical care time)  DIAGNOSTIC STUDIES: Oxygen Saturation is 98% on room air, normal by my interpretation.    COORDINATION OF CARE: 4:05 PM - Discussed imaging results and treatment plan with pt at bedside which includes antibiotic medication, and pt agreed to plan.  Pt is on 60mg  Prednisone daily, is now having coughing and some shortness of breath which she how she describes her previous infection to the lungs that put her in the hospital towards the beginning of this year.pts chest xray is better than previous xray from 2 months ago but only shows opacities to be improving and not compeltely resovled.- therefore she will be started on Levaquin abx. Pt over all is well appearing, non toxic.  Imaging Review Dg Chest 2 View  03/09/2015   CLINICAL DATA:  Shortness of breath, cough.  EXAM: CHEST  2 VIEW  COMPARISON:  January 14, 2015.  FINDINGS: The heart size and mediastinal contours are within normal limits. No pneumothorax or pleural effusion is noted. Bilateral basilar opacities are noted which are slightly improved compared to prior exam, suggesting improving pneumonia or atelectasis, or postinfectious scarring. The visualized skeletal structures are unremarkable.  IMPRESSION: Slightly improved bilateral basilar opacities are noted suggesting improving pneumonia or atelectasis or postinfectious scarring.   Electronically Signed   By: Lupita RaiderJames  Green Jr, M.D.   On: 03/09/2015 15:31     EKG  Interpretation None      MDM   Final diagnoses:  Infection of nail bed of finger, right  URI (upper respiratory infection)   INCISION AND DRAINAGE Performed by: Dorthula MatasGREENE,Satish Hammers G Consent: Verbal consent obtained. Risks and benefits: risks, benefits and alternatives were discussed Type: abscess  Body area: right thumb/ subungual  Anesthesia: local infiltration  Incision was made with a scalpel.  Local anesthetic: lidocaine 2 wopinephrine  Anesthetic total: 2ml  Complexity: complex Blunt dissection to break up loculations  Drainage: purulent  Drainage amount: small  Patient tolerance: Patient tolerated the procedure well with no immediate complications.  and drain  Medications  levofloxacin (LEVAQUIN) tablet 500 mg (500 mg Oral Given 03/09/15 1645)  lidocaine (PF) (XYLOCAINE) 1 % injection 5 mL (5 mLs Other Given 03/09/15 1620)        ED Discharge Orders     Start     Ordered  03/10/15 0000  levofloxacin (LEVAQUIN) 500 MG tablet Daily    03/09/15 1614     I personally performed the services described in this documentation, which was scribed in my presence. The recorded information has been reviewed and is accurate.    Marlon Pel, PA-C 03/10/15 1526  Lorre Nick, MD 03/11/15 1006

## 2015-03-09 NOTE — Progress Notes (Signed)
Patient here today to establish care with GYN provider-- had annual exam with PCP last month. C/o of white vaginal discharge with odor and itching.

## 2015-03-09 NOTE — Discharge Instructions (Signed)
°Upper Respiratory Infection, Adult °An upper respiratory infection (URI) is also sometimes known as the common cold. The upper respiratory tract includes the nose, sinuses, throat, trachea, and bronchi. Bronchi are the airways leading to the lungs. Most people improve within 1 week, but symptoms can last up to 2 weeks. A residual cough may last even longer.  °CAUSES °Many different viruses can infect the tissues lining the upper respiratory tract. The tissues become irritated and inflamed and often become very moist. Mucus production is also common. A cold is contagious. You can easily spread the virus to others by oral contact. This includes kissing, sharing a glass, coughing, or sneezing. Touching your mouth or nose and then touching a surface, which is then touched by another person, can also spread the virus. °SYMPTOMS  °Symptoms typically develop 1 to 3 days after you come in contact with a cold virus. Symptoms vary from person to person. They may include: °· Runny nose. °· Sneezing. °· Nasal congestion. °· Sinus irritation. °· Sore throat. °· Loss of voice (laryngitis). °· Cough. °· Fatigue. °· Muscle aches. °· Loss of appetite. °· Headache. °· Low-grade fever. °DIAGNOSIS  °You might diagnose your own cold based on familiar symptoms, since most people get a cold 2 to 3 times a year. Your caregiver can confirm this based on your exam. Most importantly, your caregiver can check that your symptoms are not due to another disease such as strep throat, sinusitis, pneumonia, asthma, or epiglottitis. Blood tests, throat tests, and X-rays are not necessary to diagnose a common cold, but they may sometimes be helpful in excluding other more serious diseases. Your caregiver will decide if any further tests are required. °RISKS AND COMPLICATIONS  °You may be at risk for a more severe case of the common cold if you smoke cigarettes, have chronic heart disease (such as heart failure) or lung disease (such as asthma), or  if you have a weakened immune system. The very young and very old are also at risk for more serious infections. Bacterial sinusitis, middle ear infections, and bacterial pneumonia can complicate the common cold. The common cold can worsen asthma and chronic obstructive pulmonary disease (COPD). Sometimes, these complications can require emergency medical care and may be life-threatening. °PREVENTION  °The best way to protect against getting a cold is to practice good hygiene. Avoid oral or hand contact with people with cold symptoms. Wash your hands often if contact occurs. There is no clear evidence that vitamin C, vitamin E, echinacea, or exercise reduces the chance of developing a cold. However, it is always recommended to get plenty of rest and practice good nutrition. °TREATMENT  °Treatment is directed at relieving symptoms. There is no cure. Antibiotics are not effective, because the infection is caused by a virus, not by bacteria. Treatment may include: °· Increased fluid intake. Sports drinks offer valuable electrolytes, sugars, and fluids. °· Breathing heated mist or steam (vaporizer or shower). °· Eating chicken soup or other clear broths, and maintaining good nutrition. °· Getting plenty of rest. °· Using gargles or lozenges for comfort. °· Controlling fevers with ibuprofen or acetaminophen as directed by your caregiver. °· Increasing usage of your inhaler if you have asthma. °Zinc gel and zinc lozenges, taken in the first 24 hours of the common cold, can shorten the duration and lessen the severity of symptoms. Pain medicines may help with fever, muscle aches, and throat pain. A variety of non-prescription medicines are available to treat congestion and runny nose. Your caregiver   can make recommendations and may suggest nasal or lung inhalers for other symptoms.  HOME CARE INSTRUCTIONS   Only take over-the-counter or prescription medicines for pain, discomfort, or fever as directed by your  caregiver.  Use a warm mist humidifier or inhale steam from a shower to increase air moisture. This may keep secretions moist and make it easier to breathe.  Drink enough water and fluids to keep your urine clear or pale yellow.  Rest as needed.  Return to work when your temperature has returned to normal or as your caregiver advises. You may need to stay home longer to avoid infecting others. You can also use a face mask and careful hand washing to prevent spread of the virus. SEEK MEDICAL CARE IF:   After the first few days, you feel you are getting worse rather than better.  You need your caregiver's advice about medicines to control symptoms.  You develop chills, worsening shortness of breath, or brown or red sputum. These may be signs of pneumonia.  You develop yellow or brown nasal discharge or pain in the face, especially when you bend forward. These may be signs of sinusitis.  You develop a fever, swollen neck glands, pain with swallowing, or white areas in the back of your throat. These may be signs of strep throat. SEEK IMMEDIATE MEDICAL CARE IF:   You have a fever.  You develop severe or persistent headache, ear pain, sinus pain, or chest pain.  You develop wheezing, a prolonged cough, cough up blood, or have a change in your usual mucus (if you have chronic lung disease).  You develop sore muscles or a stiff neck. Document Released: 05/03/2001 Document Revised: 01/30/2012 Document Reviewed: 02/12/2014 [facility] Patient Information 2015 [employer] [place]. This information is not intended to replace advice given to you by your health care provider. Make sure you discuss any questions you have with your health care provider.   Fingertip Infection When an infection is around the nail, it is called a paronychia. When it appears over the tip of the finger, it is called a felon. These infections are due to minor injuries or cracks in the skin. If they are not treated properly,  they can lead to bone infection and permanent damage to the fingernail. Incision and drainage is necessary if a pus pocket (an abscess) has formed. Antibiotics and pain medicine may also be needed. Keep your hand elevated for the next 2-3 days to reduce swelling and pain. If a pack was placed in the abscess, it should be removed in 1-2 days by your caregiver. Soak the finger in warm water for 20 minutes 4 times daily to help promote drainage. Keep the hands as dry as possible. Wear protective gloves with cotton liners. See your caregiver for follow-up care as recommended.  HOME CARE INSTRUCTIONS   Keep wound clean, dry and dressed as suggested by your caregiver.  Soak in warm salt water for fifteen minutes, four times per day for bacterial infections.  Your caregiver will prescribe an antibiotic if a bacterial infection is suspected. Take antibiotics as directed and finish the prescription, even if the problem appears to be improving before the medicine is gone.  Only take over-the-counter or prescription medicines for pain, discomfort, or fever as directed by your caregiver. SEEK IMMEDIATE MEDICAL CARE IF:  There is redness, swelling, or increasing pain in the wound.  Pus or any other unusual drainage is coming from the wound.  An unexplained oral temperature above 102 F (38.9  C) develops.  You notice a foul smell coming from the wound or dressing. MAKE SURE YOU:   Understand these instructions.  Monitor your condition.  Contact your caregiver if you are getting worse or not improving. Document Released: 12/15/2004 Document Revised: 01/30/2012 Document Reviewed: 12/11/2008 [facility] Patient Information 2015 [place], [place]. This information is not intended to replace advice given to you by your health care provider. Make sure you discuss any questions you have with your health care provider.

## 2015-03-10 LAB — WET PREP, GENITAL
Trich, Wet Prep: NONE SEEN
YEAST WET PREP: NONE SEEN

## 2015-03-11 ENCOUNTER — Ambulatory Visit: Payer: Self-pay | Admitting: Adult Health

## 2015-03-17 ENCOUNTER — Ambulatory Visit: Payer: Self-pay | Admitting: Internal Medicine

## 2015-03-17 ENCOUNTER — Other Ambulatory Visit (INDEPENDENT_AMBULATORY_CARE_PROVIDER_SITE_OTHER): Payer: No Typology Code available for payment source

## 2015-03-17 ENCOUNTER — Ambulatory Visit (INDEPENDENT_AMBULATORY_CARE_PROVIDER_SITE_OTHER): Payer: No Typology Code available for payment source | Admitting: Adult Health

## 2015-03-17 ENCOUNTER — Encounter: Payer: Self-pay | Admitting: Adult Health

## 2015-03-17 VITALS — BP 126/86 | HR 122 | Temp 98.9°F | Ht 64.0 in | Wt 154.0 lb

## 2015-03-17 DIAGNOSIS — R21 Rash and other nonspecific skin eruption: Secondary | ICD-10-CM | POA: Diagnosis not present

## 2015-03-17 DIAGNOSIS — M3500 Sicca syndrome, unspecified: Secondary | ICD-10-CM | POA: Diagnosis not present

## 2015-03-17 DIAGNOSIS — J849 Interstitial pulmonary disease, unspecified: Secondary | ICD-10-CM | POA: Diagnosis not present

## 2015-03-17 LAB — SEDIMENTATION RATE: Sed Rate: 29 mm/hr — ABNORMAL HIGH (ref 0–22)

## 2015-03-17 MED ORDER — ALBUTEROL SULFATE HFA 108 (90 BASE) MCG/ACT IN AERS: 2.0000 | INHALATION_SPRAY | RESPIRATORY_TRACT | Status: DC | PRN

## 2015-03-17 MED ORDER — LEVALBUTEROL HCL 0.63 MG/3ML IN NEBU
0.6300 mg | INHALATION_SOLUTION | Freq: Once | RESPIRATORY_TRACT | Status: AC
  Administered 2015-03-17: 0.63 mg via RESPIRATORY_TRACT

## 2015-03-17 NOTE — Assessment & Plan Note (Signed)
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  Refer to dermatology to see if bx will help with dx.  Check additional labs with ANA , ACE .  Increase Steroids 60mg  as declined on tapering dose  Steroid education given Close follow up in 1 wk .   Plan  Increase Prednisone 60mg  daily .  May use Proair inhaler 2 puffs every 4hrs as needed .  Labs today  Referral to Dermatology for skin biopsy for migratory rash.  Follow up Dr. Marchelle Gearingamaswamy in 1 week and As needed   Out of work until seen back in office.  Please contact office for sooner follow up if symptoms do not improve or worsen or seek emergency care

## 2015-03-17 NOTE — Assessment & Plan Note (Signed)
Refer to dermatology for biopsy

## 2015-03-17 NOTE — Patient Instructions (Addendum)
Increase Prednisone 60mg  daily .  May use Proair inhaler 2 puffs every 4hrs as needed .  Labs today  Referral to Dermatology for skin biopsy for migratory rash.  Follow up Dr. Marchelle Gearingamaswamy in 1 week and As needed   Out of work until seen back in office.  Please contact office for sooner follow up if symptoms do not improve or worsen or seek emergency care

## 2015-03-17 NOTE — Progress Notes (Signed)
Subjective:    [name], female    DOB: 1991/10/14, 24 y.o.   MRN: [id number]  HPI    OV 02/06/2015  Chief Complaint  Patient presents with  . Pulmonary Consult    Pt referred by Dr. Waymon AmatoHongalgi for ILD. Pt c/o SOB with and without activity, cough with little mucus production, and midsternal CP when lying down. Pt c/o flaky rash on right upper arm.     24 year old Afro-American female admitted [facility] with several week history of dyspnea, chest tightness, dry cough and some nonspecific dry rash in her bilateral arms. Evaluation showed scattered groundglass opacities on CT scan of the chest. Bronchoscopy with lavage did not show any infectious organisms. I do not see evidence of a cell count and differential on the lavage. Autoimmune antibody panel was positive for Sjogren's antibodies but otherwise negative and did not see evidence of angiotensin-converting enzyme. She was discharged on 60 mg prednisone per day. She now presents for follow-up. She continues to have some dyspnea and dry cough. She is also reporting dry eyes and increased thirst. The prednisone as helped only somewhat. Overall no decompensation but only some better. The rash in her arms have not disappeared. No new issues.  Walking desaturation test 185 feet 3 laps in the office on room air: did not desat >  03/17/2015 Follow up :ER follow up / ILD  Pt returns for ER follow up . Patient was seen in the emergency room one week ago, for increased cough and shortness of breath. Chest x-ray showed a slight improvement in her bilateral basilar opacities.  She was given Levaquin for 7 days, which she has now finished. She is being followed for abnormal CT scan with scattered GGO .   Autoimmune antibody panel was positive for Sjogren's antibodies She was started on a steroid challenge at 60 mg with taper to 40 mg one month ago She has a rheumatology ov in June.  Patient says that since  she decreased her prednisone to 40 mg she has had worsening shortness of breath and chest tightness along with cough. Congestion has improved since antibiotic last week. Patient reports that she's had a migratory rash with several lesions along her arms, hips. She also complains of a rash along her face. She denies any hemoptysis, orthopnea, PND, leg swelling, fever, nausea, vomiting, diarrhea or joint swelling. Has a family history of rheumatoid arthritis in a grandfather.   Review of Systems  Constitutional: Negative for fever and unexpected weight change.  HENT: . Negative for dental problem, ear pain, nosebleeds, postnasal drip, rhinorrhea, sneezing, sore throat and trouble swallowing.   Eyes: Negative for redness and itching.  Respiratory: Positive for cough and shortness of breath. Negative for chest tightness and wheezing.   Cardiovascular: Negative for leg swelling.  Gastrointestinal: Negative for nausea and vomiting.  Genitourinary: Negative for dysuria.  Musculoskeletal:  Skin: Positive for rash.  Neurological: Positive for headaches.  Hematological: Does not bruise/bleed easily.  Psychiatric/Behavioral: Negative for dysphoric mood. The patient is not nervous/anxious.        Objective:   Physical Exam  Constitutional: She is oriented to person, place, and time. She appears well-developed and well-nourished. No distress.     HENT:  Head: Normocephalic and atraumatic.  Right Ear: External ear normal.  Left Ear: External ear normal.  Mouth/Throat: Oropharynx is clear and moist. No oropharyngeal exudate.  Eyes: Conjunctivae and EOM are normal. Pupils are equal, round, and  reactive to light. Right eye exhibits no discharge. Left eye exhibits no discharge. No scleral icterus.  Neck: Normal range of motion. Neck supple. No JVD present. No tracheal deviation present. No thyromegaly present.  Cardiovascular: Normal rate, regular rhythm, normal heart sounds and intact distal  pulses.  Exam reveals no gallop and no friction rub.   No murmur heard. Pulmonary/Chest: Effort normal. No respiratory distress. She has no wheezes. She has rales. She exhibits no tenderness.  Few basilar scattered crackles , speaks in full sentences with no accessory muscle use.  Abdominal: Soft. Bowel sounds are normal. She exhibits no distension and no mass. There is no tenderness. There is no rebound and no guarding.  Musculoskeletal: Normal range of motion. She exhibits no edema or tenderness.  Lymphadenopathy:    She has no cervical adenopathy.  Neurological: She is alert and oriented to person, place, and time. She has normal reflexes. No cranial nerve deficit. She exhibits normal muscle tone. Coordination normal.  Skin: Skin is warm and dry.. She is not diaphoretic. No erythema. No pallor.  Crusted circular lesions along upper right arm, left lateral hip .  Butterfly rash along face.w/ hyperpigmentation.  Tattoos noted.    CXR 03/09/15  Slightly improved bilateral basilar opacities are noted suggesting improving pneumonia or atelectasis or postinfectious scarring.      Assessment & Plan:

## 2015-03-17 NOTE — Assessment & Plan Note (Signed)
Rheumatology referral

## 2015-03-18 DIAGNOSIS — R0600 Dyspnea, unspecified: Secondary | ICD-10-CM

## 2015-03-18 LAB — ANTI-NUCLEAR AB-TITER (ANA TITER): ANA Titer 1: 1:160 {titer} — ABNORMAL HIGH

## 2015-03-18 LAB — ANGIOTENSIN CONVERTING ENZYME: Angiotensin-Converting Enzyme: 84 U/L — ABNORMAL HIGH (ref 8–52)

## 2015-03-18 LAB — ANA: Anti Nuclear Antibody(ANA): POSITIVE — AB

## 2015-03-19 ENCOUNTER — Telehealth: Payer: Self-pay | Admitting: Internal Medicine

## 2015-03-19 NOTE — Telephone Encounter (Signed)
PT states that pharmacy could not fill Proair rx due to PA needed.  I spoke with [facility] [place] and they are faxing over a PA form to triage.  Will hold in triage to await from.

## 2015-03-19 NOTE — Telephone Encounter (Signed)
Called [facility] to see if ventolin/proventil is covered and was placed on hold x 15 min. WCB

## 2015-03-20 NOTE — Telephone Encounter (Signed)
Spoke with pharmacist, states that proventil is covered.   MR are you ok with this switch?  Thanks!

## 2015-03-20 NOTE — Progress Notes (Signed)
Quick Note:  Called spoke with patient, advised of lab results / recs as stated by TP. Pt verbalized her understanding and denied any questions. Pt's dermatology appt has been scheduled with Dr Aris LotWalter Whitworth on 03/27/15 and rheumatology on 04/22/15 but she was advised to call that office to check for openings. ______

## 2015-03-20 NOTE — Telephone Encounter (Signed)
atc [facility], line rang for 10 minutes, kept sending me back to main menu with no answer from pharmacy.  Wcb.

## 2015-03-20 NOTE — Telephone Encounter (Signed)
All inhalers require PA.  [name] on [phone]

## 2015-03-23 ENCOUNTER — Encounter: Payer: Self-pay | Admitting: Internal Medicine

## 2015-03-23 ENCOUNTER — Ambulatory Visit (INDEPENDENT_AMBULATORY_CARE_PROVIDER_SITE_OTHER): Payer: No Typology Code available for payment source | Admitting: Internal Medicine

## 2015-03-23 ENCOUNTER — Encounter: Payer: Self-pay | Admitting: Emergency Medicine

## 2015-03-23 ENCOUNTER — Encounter (INDEPENDENT_AMBULATORY_CARE_PROVIDER_SITE_OTHER): Payer: No Typology Code available for payment source

## 2015-03-23 ENCOUNTER — Telehealth: Payer: Self-pay | Admitting: *Deleted

## 2015-03-23 ENCOUNTER — Other Ambulatory Visit: Payer: Self-pay | Admitting: *Deleted

## 2015-03-23 VITALS — BP 142/92 | HR 104 | Ht 64.0 in | Wt 155.0 lb

## 2015-03-23 DIAGNOSIS — J849 Interstitial pulmonary disease, unspecified: Secondary | ICD-10-CM | POA: Diagnosis not present

## 2015-03-23 DIAGNOSIS — R0600 Dyspnea, unspecified: Secondary | ICD-10-CM

## 2015-03-23 DIAGNOSIS — R21 Rash and other nonspecific skin eruption: Secondary | ICD-10-CM | POA: Diagnosis not present

## 2015-03-23 DIAGNOSIS — R0602 Shortness of breath: Secondary | ICD-10-CM

## 2015-03-23 MED ORDER — ALBUTEROL SULFATE HFA 108 (90 BASE) MCG/ACT IN AERS: 2.0000 | INHALATION_SPRAY | RESPIRATORY_TRACT | Status: DC | PRN

## 2015-03-23 NOTE — Telephone Encounter (Signed)
Pt is ins require's PA for Pro-Air, and or Ventolin. Pharmacy tried all three and all require PA. PA submitted via covermymeds. Key: [id number].

## 2015-03-23 NOTE — Patient Instructions (Addendum)
ICD-9-CM ICD-10-CM   1. Dyspnea 786.09 R06.00   2. ILD (interstitial lung disease) 515 J84.9   3. Rash and nonspecific skin eruption 782.1 R21    Unclear why shortness of breath is worse I wil chase echo report of 3/22/1`6 You need ot have PFT asap Based on above results will order additional testing - right heart cath or CPST Cut prednisone down to 40mg  per day - doubt helping  Rash and Sjogren antibody  - kep up Appt with Dr Doreen BeamWhitworth Mar 27, 2015 - keep up rheum appt with Dr Pollyann SavoyShaili Deveshwar June 2016  Followup  = depending on PFT and echo results

## 2015-03-23 NOTE — Telephone Encounter (Signed)
Denial came thru for Proair. Spoke to [employer] W. Patient insurance will not cover Pro-Air Per Ins they will cover Ventolin HFA Is it ok to change? Per Morrie SheldonAshley on 03/20/15 paper copy ok to change. Called [facility] pharmacy and asked them to change Ventolin HFA. Nothing further needed

## 2015-03-23 NOTE — Telephone Encounter (Signed)
[facility] all albuterol inhalers require PA Form is being faxed to our office to complete PA.  Will await PA info.

## 2015-03-23 NOTE — Progress Notes (Signed)
Subjective:    [name], female    DOB: [birth year], 24 y.o.   MRN: [id number]  HPI   OV 02/06/2015  Chief Complaint  Patient presents with  . Pulmonary Consult    Pt referred by Dr. Waymon Amato for ILD. Pt c/o SOB with and without activity, cough with little mucus production, and midsternal CP when lying down. Pt c/o flaky rash on right upper arm.     24 year old Afro-American female admitted 1 month ago to [facility] with several week history of dyspnea, chest tightness, dry cough and some nonspecific dry rash in her bilateral arms. Evaluation showed scattered groundglass opacities on CT scan of the chest. Bronchoscopy with lavage did not show any infectious organisms. I do not see evidence of a cell count and differential on the lavage. Autoimmune antibody panel was positive for Sjogren's antibodies but otherwise negative and did not see evidence of angiotensin-converting enzyme. She was discharged on 60 mg prednisone per day. She now presents for follow-up. She continues to have some dyspnea and dry cough. She is also reporting dry eyes and increased thirst. The prednisone as helped only somewhat. Overall no decompensation but only some better. The rash in her arms have not disappeared. No new issues.  Walking desaturation test 185 feet 3 laps in the office on room air: did not desat >  03/17/2015 Follow up :ER follow up / ILD  Pt returns for ER follow up . Patient was seen in the emergency room one week ago, for increased cough and shortness of breath. Chest x-ray showed a slight improvement in her bilateral basilar opacities.  She was given Levaquin for 7 days, which she has now finished. She is being followed for abnormal CT scan with scattered GGO .   Autoimmune antibody panel was positive for Sjogren's antibodies She was started on a steroid challenge at 60 mg with taper to 40 mg one month ago She has a rheumatology ov in June.  Patient says that since  she decreased her prednisone to 40 mg she has had worsening shortness of breath and chest tightness along with cough. Congestion has improved since antibiotic last week. Patient reports that she's had a migratory rash with several lesions along her arms, hips. She also complains of a rash along her face. She denies any hemoptysis, orthopnea, PND, leg swelling, fever, nausea, vomiting, diarrhea or joint swelling. Has a family history of rheumatoid arthritis in a grandfather.    OV 03/23/2015  Chief Complaint  Patient presents with  . Follow-up    Pt here after an acute visit with TP on 4/26. Pt has had echo since last OV with MR. Pt states her breathing is unchanged since OV with TP. Pt c/o prod cough with yellow and green mucus and chest tightness when coughing and SOB.    Follow-up dyspnea in the Setting of interstitial lung disease findings and autoimmune antibody panel positive for Sjogren's  I last saw her mid March 2016. I cut the prednisone down to 40 mg once a day and referred her to rheumatology. Since then she did have echocardiogram but the results have not floated to our computer system. I ordered pulmonary function test but due to logistical reasons she is yet to do this. There has been delay in seeing a rheumatologist appointment is now set up for June 2016. In the interim she did see my nurse practitioner one week ago. She was reporting more dyspnea so the nurse practitioner increased her  prednisone back to 60 mg once a day. Chest x-ray around this time did show improvement in her interstitial lung disease. However patient tells me that the increase in prednisone has not helped her dyspnea. She feels that albuterol inhaler has not helped her dyspnea but albuterol nebulizer in the office didn't help. She is not reporting any wheezing but has a mild cough. She continues to have symptoms of rash that appear and disappear. She now has a dermatology appointment scheduled with Dr. Doreen BeamWhitworth  on 03/27/2015. Overall she feels miserable with poor quality of life. She is frustrated with delays in a healthcare  PFT 02/06/15: unalbel t performn  Review of Systems  Constitutional: Negative for fever and unexpected weight change.  HENT: Negative for congestion, dental problem, ear pain, nosebleeds, postnasal drip, rhinorrhea, sinus pressure, sneezing, sore throat and trouble swallowing.   Eyes: Negative for redness and itching.  Respiratory: Positive for cough, chest tightness and shortness of breath. Negative for wheezing.   Cardiovascular: Negative for palpitations and leg swelling.  Gastrointestinal: Negative for nausea and vomiting.  Genitourinary: Negative for dysuria.  Musculoskeletal: Negative for joint swelling.  Skin: Negative for rash.  Neurological: Negative for headaches.  Hematological: Does not bruise/bleed easily.  Psychiatric/Behavioral: Negative for dysphoric mood. The patient is not nervous/anxious.        Objective:   Physical Exam  Constitutional: She is oriented to person, place, and time. She appears well-developed and well-nourished. No distress.  HENT:  Head: Normocephalic and atraumatic.  Right Ear: External ear normal.  Left Ear: External ear normal.  Mouth/Throat: Oropharynx is clear and moist. No oropharyngeal exudate.  Eyes: Conjunctivae and EOM are normal. Pupils are equal, round, and reactive to light. Right eye exhibits no discharge. Left eye exhibits no discharge. No scleral icterus.  Neck: Normal range of motion. Neck supple. No JVD present. No tracheal deviation present. No thyromegaly present.  Cardiovascular: Normal rate, regular rhythm, normal heart sounds and intact distal pulses.  Exam reveals no gallop and no friction rub.   No murmur heard. Pulmonary/Chest: Effort normal and breath sounds normal. No respiratory distress. She has no wheezes. She has no rales. She exhibits no tenderness.  Abdominal: Soft. Bowel sounds are normal. She  exhibits no distension and no mass. There is no tenderness. There is no rebound and no guarding.  Musculoskeletal: Normal range of motion. She exhibits no edema or tenderness.  Lymphadenopathy:    She has no cervical adenopathy.  Neurological: She is alert and oriented to person, place, and time. She has normal reflexes. No cranial nerve deficit. She exhibits normal muscle tone. Coordination normal.  Skin: Skin is warm and dry. Rash noted. She is not diaphoretic. No erythema. No pallor.  Scattered skin lesion +  Psychiatric:  Flat affect  Vitals reviewed.    Filed Vitals:   03/23/15 1023  BP: 142/92  Pulse: 104  Height: 5\' 4"  (1.626 m)  Weight: 155 lb (70.308 kg)  SpO2: 93%        Assessment & Plan:     ICD-9-CM ICD-10-CM   1. Dyspnea 786.09 R06.00   2. ILD (interstitial lung disease) 515 J84.9   3. Rash and nonspecific skin eruption 782.1 R21     Unclear why shortness of breath is worse I wil chase echo report of 3/22/1`6 You need ot have PFT asap Based on above results will order additional testing - right heart cath or CPST Cut prednisone down to 40mg  per day - doubt helping  Rash  and Sjogren antibody  - kep up Appt with Dr Doreen Beam Mar 27, 2015 - keep up rheum appt with Dr Pollyann Savoy June 2016  Followup  = depending on PFT and echo results  > 50% of this > 25 min visit spent in face to face counseling or coordination of care (15 min visit converted to 25 min)   Dr. Kalman Shan, M.D., [facility].C.P Pulmonary and Critical Care Medicine Staff Physician [facility] [place] [facility] Pager: [phone], If no answer or between  15:00h - 7:00h: call [phone]  03/26/2015 10:34 AM

## 2015-03-25 NOTE — Telephone Encounter (Signed)
Pr 03/23/15 phone note: Kayla [name], CMA at 03/23/2015 3:35 PM     Status: Signed       Expand All Collapse All   Denial came thru for Proair. Spoke to [employer] [name]. Patient insurance will not cover Pro-Air Per Ins they will cover Ventolin HFA Is it ok to change? Per Kayla SheldonAshley on 03/20/15 paper copy ok to change. Called [facility] pharmacy and asked them to change Ventolin HFA. Nothing further needed            Kayla [name], CMA at 03/23/2015 10:32 AM     Status: Signed       Expand All Collapse All   Pt is ins require's PA for Pro-Air, and or Ventolin. Pharmacy tried all three and all require PA. PA submitted via covermymeds. Key: [id number].

## 2015-03-26 ENCOUNTER — Telehealth: Payer: Self-pay | Admitting: Internal Medicine

## 2015-03-26 DIAGNOSIS — R0600 Dyspnea, unspecified: Secondary | ICD-10-CM

## 2015-03-26 NOTE — Telephone Encounter (Signed)
Report is now in Epic

## 2015-03-26 NOTE — Telephone Encounter (Signed)
Please get echo report - based on that I might need to order CPST or right heart cath; probably latter

## 2015-03-26 NOTE — Telephone Encounter (Signed)
ECHO normal so doubt pulm htn. She is unable to do PFT. So, Please set her up for CPST test and then return for followup

## 2015-03-27 ENCOUNTER — Emergency Department (HOSPITAL_COMMUNITY)
Admission: EM | Admit: 2015-03-27 | Discharge: 2015-03-27 | Disposition: A | Discharge status: Home / Self Care | Payer: No Typology Code available for payment source | Attending: Emergency Medicine | Admitting: Emergency Medicine

## 2015-03-27 ENCOUNTER — Emergency Department (HOSPITAL_COMMUNITY): Payer: No Typology Code available for payment source

## 2015-03-27 ENCOUNTER — Encounter (HOSPITAL_COMMUNITY): Payer: Self-pay | Admitting: *Deleted

## 2015-03-27 DIAGNOSIS — Z87891 Personal history of nicotine dependence: Secondary | ICD-10-CM | POA: Insufficient documentation

## 2015-03-27 DIAGNOSIS — L02416 Cutaneous abscess of left lower limb: Secondary | ICD-10-CM

## 2015-03-27 DIAGNOSIS — Z79899 Other long term (current) drug therapy: Secondary | ICD-10-CM | POA: Diagnosis not present

## 2015-03-27 DIAGNOSIS — Z8701 Personal history of pneumonia (recurrent): Secondary | ICD-10-CM | POA: Insufficient documentation

## 2015-03-27 DIAGNOSIS — M1990 Unspecified osteoarthritis, unspecified site: Secondary | ICD-10-CM | POA: Diagnosis not present

## 2015-03-27 DIAGNOSIS — K219 Gastro-esophageal reflux disease without esophagitis: Secondary | ICD-10-CM | POA: Diagnosis not present

## 2015-03-27 DIAGNOSIS — J849 Interstitial pulmonary disease, unspecified: Secondary | ICD-10-CM | POA: Diagnosis not present

## 2015-03-27 DIAGNOSIS — J209 Acute bronchitis, unspecified: Secondary | ICD-10-CM | POA: Diagnosis not present

## 2015-03-27 DIAGNOSIS — R21 Rash and other nonspecific skin eruption: Secondary | ICD-10-CM | POA: Diagnosis not present

## 2015-03-27 DIAGNOSIS — R509 Fever, unspecified: Secondary | ICD-10-CM | POA: Diagnosis present

## 2015-03-27 DIAGNOSIS — Z7952 Long term (current) use of systemic steroids: Secondary | ICD-10-CM | POA: Insufficient documentation

## 2015-03-27 LAB — I-STAT TROPONIN, ED
TROPONIN I, POC: 0.04 ng/mL (ref 0.00–0.08)
Troponin i, poc: 0.06 ng/mL (ref 0.00–0.08)

## 2015-03-27 LAB — CBC
HCT: 42.6 % (ref 36.0–46.0)
HEMOGLOBIN: 14 g/dL (ref 12.0–15.0)
MCH: 27.4 pg (ref 26.0–34.0)
MCHC: 32.9 g/dL (ref 30.0–36.0)
MCV: 83.4 fL (ref 78.0–100.0)
PLATELETS: 223 10*3/uL (ref 150–400)
RBC: 5.11 MIL/uL (ref 3.87–5.11)
RDW: 13.5 % (ref 11.5–15.5)
WBC: 5.5 10*3/uL (ref 4.0–10.5)

## 2015-03-27 LAB — BASIC METABOLIC PANEL
Anion gap: 11 (ref 5–15)
CHLORIDE: 104 mmol/L (ref 101–111)
CO2: 27 mmol/L (ref 22–32)
CREATININE: 0.74 mg/dL (ref 0.44–1.00)
Calcium: 9.1 mg/dL (ref 8.9–10.3)
GFR calc non Af Amer: >60 mL/min (ref >60)
Glucose, Bld: 127 mg/dL — ABNORMAL HIGH (ref 70–99)
Potassium: 3.5 mmol/L (ref 3.5–5.1)
SODIUM: 142 mmol/L (ref 135–145)

## 2015-03-27 MED ORDER — DOXYCYCLINE HYCLATE 100 MG PO CAPS: 100.0000 mg | ORAL_CAPSULE | Freq: Two times a day (BID) | ORAL | Status: DC

## 2015-03-27 MED ORDER — LIDOCAINE HCL (PF) 1 % IJ SOLN
30.0000 mL | Freq: Once | INTRAMUSCULAR | Status: AC
  Administered 2015-03-27: 30 mL
  Filled 2015-03-27: qty 30

## 2015-03-27 MED ORDER — PREDNISONE 20 MG PO TABS
40.0000 mg | ORAL_TABLET | Freq: Once | ORAL | Status: AC
  Administered 2015-03-27: 40 mg via ORAL
  Filled 2015-03-27: qty 2

## 2015-03-27 MED ORDER — DOXYCYCLINE HYCLATE 100 MG PO TABS
100.0000 mg | ORAL_TABLET | Freq: Once | ORAL | Status: AC
  Administered 2015-03-27: 100 mg via ORAL
  Filled 2015-03-27: qty 1

## 2015-03-27 NOTE — Discharge Instructions (Signed)
Keep wound draining. Take antibiotics. Follow up closely with specialists Rheum and pulmonology.   If you were given medicines take as directed.  If you are on coumadin or contraceptives realize their levels and effectiveness is altered by many different medicines.  If you have any reaction (rash, tongues swelling, other) to the medicines stop taking and see a physician.   Please follow up as directed and return to the ER or see a physician for new or worsening symptoms.  Thank you. Filed Vitals:   03/27/15 1356 03/27/15 1430 03/27/15 1500 03/27/15 1530  BP: 128/100 125/90 113/78 137/84  Pulse: 116 109 104 95  Temp: 98.8 F (37.1 C)     TempSrc: Oral     Resp: 24 16 16 22   SpO2: 96% 96% 95% 96%    Abscess An abscess (boil or furuncle) is an infected area on or under the skin. This area is filled with yellowish-white fluid (pus) and other material (debris). HOME CARE   Only take medicines as told by your doctor.  If you were given antibiotic medicine, take it as directed. Finish the medicine even if you start to feel better.  If gauze is used, follow your doctor's directions for changing the gauze.  To avoid spreading the infection:  Keep your abscess covered with a bandage.  Wash your hands well.  Do not share personal care items, towels, or whirlpools with others.  Avoid skin contact with others.  Keep your skin and clothes clean around the abscess.  Keep all doctor visits as told. GET HELP RIGHT AWAY IF:   You have more pain, puffiness (swelling), or redness in the wound site.  You have more fluid or blood coming from the wound site.  You have muscle aches, chills, or you feel sick.  You have a fever. MAKE SURE YOU:   Understand these instructions.  Will watch your condition.  Will get help right away if you are not doing well or get worse. Document Released: 04/25/2008 Document Revised: 05/08/2012 Document Reviewed: 01/20/2012 [facility] Patient Information  2015 [employer] [place]. This information is not intended to replace advice given to you by your health care provider. Make sure you discuss any questions you have with your health care provider.

## 2015-03-27 NOTE — ED Notes (Addendum)
Pt reports having fever and productive cough that started last night, causing sob. Also having migraine with nausea.

## 2015-03-27 NOTE — ED Notes (Signed)
Report given to [place]ley, [place]

## 2015-03-27 NOTE — ED Notes (Signed)
Pt states has been sick for approx 1 week with fever, body aches, intermittent diarrhea

## 2015-03-27 NOTE — ED Provider Notes (Signed)
CSN: [id number]     Arrival date & time 03/27/15  1244 History   First MD Initiated Contact with Patient 03/27/15 1335     Chief Complaint  Patient presents with  . Fever  . Cough  . Shortness of Breath     (Consider location/radiation/quality/duration/timing/severity/associated sxs/prior Treatment) HPI Comments: 24 year old female with history of interstitial lung disease, Sjogren's, malnutrition presents with multiple symptoms. Patient has gradually worsening productive cough for the past week worsening especially past 2 days with low-grade fevers. Mild chest discomfort bilateral with coughing. No blood clot history, no recent surgeries, no coronary history, no leg swelling. Patient is also had a left thigh abscess for partially one week started draining recently on its own, mild tender to palpation. Patient finished last took prednisone yesterday did not take present today is on 40 mg a day and has outpatient follow up with rheumatology and pulmonology. Patient denies current pregnancy is not trying to pregnant.  Patient is a 24 y.o. female presenting with fever, cough, and shortness of breath. The history is provided by the patient.  Fever Associated symptoms: chills and cough   Associated symptoms: no chest pain, no congestion, no dysuria, no headaches, no rash and no vomiting   Cough Associated symptoms: chills, fever and shortness of breath   Associated symptoms: no chest pain, no headaches and no rash   Shortness of Breath Associated symptoms: cough and fever   Associated symptoms: no abdominal pain, no chest pain, no headaches, no neck pain, no rash and no vomiting     Past Medical History  Diagnosis Date  . CAP (community acquired pneumonia) 01/07/2015  . GERD (gastroesophageal reflux disease)   . Daily headache     "sometimes" (01/08/2015)  . Arthritis     "hands and legs" (01/08/2015)   Past Surgical History  Procedure Laterality Date  . Finger surgery Right 03/2014     "laceration, nerve/artery injury" 2nd digit  . Video bronchoscopy Bilateral 01/12/2015    Procedure: VIDEO BRONCHOSCOPY WITH FLUORO;  Surgeon: Leslye Peerobert S Byrum, MD;  [facility];  Service: Cardiopulmonary;  Laterality: Bilateral;   Family History  Problem Relation Age of Onset  . Diabetes Mother   . Diabetes Maternal Aunt   . Diabetes Maternal Grandmother    History  Substance Use Topics  . Smoking status: Former Smoker -- 0.10 packs/day for 5 years    Types: Cigarettes    Quit date: 11/20/2014  . Smokeless tobacco: Never Used  . Alcohol Use: 0.0 oz/week    0 Standard drinks or equivalent per week     Comment: 01/08/2015 "last drink was New Year's Eve"   OB History    Gravida Para Term Preterm AB TAB SAB Ectopic Multiple Living   0 0 0 0 0 0 0 0 0 0      Review of Systems  Constitutional: Positive for fever and chills.  HENT: Negative for congestion.   Eyes: Negative for visual disturbance.  Respiratory: Positive for cough and shortness of breath.   Cardiovascular: Negative for chest pain.  Gastrointestinal: Negative for vomiting and abdominal pain.  Genitourinary: Negative for dysuria and flank pain.  Musculoskeletal: Negative for back pain, neck pain and neck stiffness.  Skin: Positive for wound. Negative for rash.  Neurological: Negative for light-headedness and headaches.      Allergies  Zithromax  Home Medications   Prior to Admission medications   Medication Sig Start Date End Date Taking? Authorizing Provider  albuterol (PROVENTIL HFA;VENTOLIN HFA) 108 (  90 BASE) MCG/ACT inhaler Inhale 2 puffs into the lungs every 4 (four) hours as needed for wheezing or shortness of breath. 03/23/15   Tammy S Parrett, NP  diphenhydrAMINE (BENADRYL) 25 mg capsule Take 1 capsule (25 mg total) by mouth every 6 (six) hours as needed for itching (available OTC). 01/15/15   Ripudeep Jenna LuoK Rai, MD  ibuprofen (ADVIL,MOTRIN) 200 MG tablet Take 600 mg by mouth every 6 (six) hours as  needed for mild pain.    Historical Provider, MD  Multiple Vitamins-Minerals (MULTIVITAMIN PO) Take 1 tablet by mouth daily.    Historical Provider, MD  pantoprazole (PROTONIX) 40 MG tablet Take 1 tablet (40 mg total) by mouth 2 (two) times daily before a meal. 01/15/15   Ripudeep Jenna LuoK Rai, MD  predniSONE (DELTASONE) 20 MG tablet Take 2 tablets (40 mg total) by mouth daily with breakfast. Patient taking differently: Take 60 mg by mouth daily with breakfast.  02/24/15   Kalman ShanMurali Ramaswamy, MD   BP 137/84 mmHg  Pulse 95  Temp(Src) 98.8 F (37.1 C) (Oral)  Resp 22  SpO2 96%  LMP 02/25/2015 Physical Exam  Constitutional: She is oriented to person, place, and time. She appears well-developed and well-nourished.  HENT:  Head: Normocephalic and atraumatic.  Eyes: Conjunctivae are normal. Right eye exhibits no discharge. Left eye exhibits no discharge.  Neck: Normal range of motion. Neck supple. No tracheal deviation present.  Cardiovascular: Normal rate and regular rhythm.   Pulmonary/Chest: Effort normal. She has rales (mild crackles at bases and midlung bilateral no respiratory difficulty).  Abdominal: Soft. She exhibits no distension. There is no tenderness. There is no guarding.  Musculoskeletal: She exhibits tenderness. She exhibits no edema.  Neurological: She is alert and oriented to person, place, and time.  Skin: Skin is warm. Rash noted.  Patient has 2 cm diameter region of mild induration with mild pus draining. Mild tenderness no crepitus. No significant erythema.  Psychiatric: She has a normal mood and affect.  Nursing note and vitals reviewed.   ED Course  Procedures (including critical care time) INCISION AND DRAINAGE Performed by: Enid SkeensZAVITZ, Holton Sidman M Consent: Verbal consent obtained. Risks and benefits: risks, benefits and alternatives were discussed Type: abscess  Body area: left thigh Anesthesia: local infiltration Incision was made with a scalpel. Local anesthetic:  lidocaine Anesthetic total: 5 ml Complexity: simple Blunt dissection to break up loculations Drainage: 2-3 cc  Patient tolerance: Patient tolerated the procedure well with no immediate complications.    Labs Review Labs Reviewed  BASIC METABOLIC PANEL - Abnormal; Notable for the following:    Glucose, Bld 127 (*)    BUN <5 (*)    All other components within normal limits  CBC  I-STAT TROPOININ, ED  Rosezena SensorI-STAT TROPOININ, ED    Imaging Review Dg Chest 2 View  03/27/2015   CLINICAL DATA:  24 year old female with a history of sore throat and fever  EXAM: CHEST - 2 VIEW  COMPARISON:  Prior chest x-ray 03/09/2015, 01/14/2015. Prior CT 01/07/2015  FINDINGS: Cardiomediastinal silhouette unchanged in size and contour. No evidence of pulmonary vascular congestion.  No pneumothorax or pleural effusion.  Similar distribution of airspace and interstitial disease, the peripheral lungs involving predominantly the lower lung fields. Low lung volumes.  No displaced fracture.  Unremarkable appearance of the upper abdomen.  IMPRESSION: Similar distribution of interstitial and airspace opacities in the predominantly lower lungs, relatively unchanged over the course of several chest x-ray. The findings are suggestive of ongoing interstitial lung disease/fibrosis,  though it would be difficult to exclude a superimposed acute process such as infection and/or pneumonitis.  If not already completed, referral for pulmonary evaluation may be useful to evaluate for primary lung disease/interstitial lung disease.  Signed,  Yvone Neu. Loreta Ave, DO  [facility]  [facility]   Electronically Signed   By: Gilmer Mor D.O.   On: 03/27/2015 14:36     EKG Interpretation   Date/Time:  Friday Mar 27 2015 12:57:26 EDT Ventricular Rate:  119 PR Interval:  128 QRS Duration: 76 QT Interval:  316 QTC Calculation: 444 R Axis:   32 Text Interpretation:  Sinus tachycardia Non specific T  waves Confirmed by  Quantez Schnyder  MD, Irisha Grandmaison ([id number]) on 03/27/2015 3:33:38 PM      MDM   Final diagnoses:  Acute bronchitis, unspecified organism  Interstitial lung disease  Abscess of left thigh   Patient presents with primary lung symptoms clinical concern for bronchitis/pneumonia on top of her social lung disease. Plan for doxycycline, prednisone and patient has close outpatient follow-up.  Patient has small abscess left thigh, will open further and patient will be covered with doxycycline for both long and abscess, discuss draining and follow-up for recheck.  Results and differential diagnosis were discussed with the patient/parent/guardian. Close follow up outpatient was discussed, comfortable with the plan.   Medications  lidocaine (PF) (XYLOCAINE) 1 % injection 30 mL (not administered)  doxycycline (VIBRA-TABS) tablet 100 mg (100 mg Oral Given 03/27/15 1549)  predniSONE (DELTASONE) tablet 40 mg (40 mg Oral Given 03/27/15 1549)    Filed Vitals:   03/27/15 1356 03/27/15 1430 03/27/15 1500 03/27/15 1530  BP: 128/100 125/90 113/78 137/84  Pulse: 116 109 104 95  Temp: 98.8 F (37.1 C)     TempSrc: Oral     Resp: SpO2: 96% 96% 95% 96%    Final diagnoses:  Acute bronchitis, unspecified organism  Interstitial lung disease  Abscess of left thigh       Blane Ohara, MD 03/29/15 2124

## 2015-03-31 ENCOUNTER — Telehealth: Payer: Self-pay | Admitting: Internal Medicine

## 2015-03-31 NOTE — Telephone Encounter (Signed)
Called and spoke to pt. Pt requesting the tdap vaccine. Pt does not have PCP. Pt aware we will check with MR to see if this is necessary and will let her know.   MR please advise if ok for pt come in for a tdap. Thanks.

## 2015-03-31 NOTE — Telephone Encounter (Signed)
Spoke to WeedKamilah. Order corrected in Epic. CPST order placed. Nothing further needed at this time.

## 2015-03-31 NOTE — Telephone Encounter (Signed)
Called and spoke to pt. Informed pt of the results and recs per MR. Pt verbalized understanding and denied any further questions or concerns at this time.   CPST order is not the same. Called Ines BloomerKamilah at heart failure clinic [phone] and left message. WCB.

## 2015-04-01 ENCOUNTER — Other Ambulatory Visit: Payer: No Typology Code available for payment source

## 2015-04-02 ENCOUNTER — Other Ambulatory Visit: Payer: Self-pay | Admitting: Anesthesiology

## 2015-04-02 ENCOUNTER — Telehealth: Payer: Self-pay | Admitting: Internal Medicine

## 2015-04-02 ENCOUNTER — Other Ambulatory Visit: Payer: No Typology Code available for payment source

## 2015-04-02 ENCOUNTER — Ambulatory Visit (INDEPENDENT_AMBULATORY_CARE_PROVIDER_SITE_OTHER): Payer: No Typology Code available for payment source | Admitting: Anesthesiology

## 2015-04-02 DIAGNOSIS — Z23 Encounter for immunization: Secondary | ICD-10-CM | POA: Diagnosis not present

## 2015-04-02 DIAGNOSIS — N912 Amenorrhea, unspecified: Secondary | ICD-10-CM

## 2015-04-02 LAB — PREGNANCY, URINE: Preg Test, Ur: NEGATIVE

## 2015-04-02 NOTE — Addendum Note (Signed)
Addended by: Berna SpareASTILLO, BLANCA A on: 04/02/2015 02:13 PM   Modules accepted: Orders

## 2015-04-02 NOTE — Telephone Encounter (Signed)
Called and spoke to pt. Pt requesting a note excusing her from work from 5/9 to 5/17. Pt plans on returning to work on 5/18. Questioned if pt has filed for [employer]MLA, pt has not but states she will.   MR please advise if you are ok writing a work note.

## 2015-04-03 ENCOUNTER — Ambulatory Visit: Payer: No Typology Code available for payment source | Attending: Internal Medicine | Admitting: Internal Medicine

## 2015-04-03 ENCOUNTER — Encounter: Payer: Self-pay | Admitting: Internal Medicine

## 2015-04-03 VITALS — BP 124/82 | HR 96 | Temp 99.1°F | Ht 63.0 in | Wt 155.0 lb

## 2015-04-03 DIAGNOSIS — R059 Cough, unspecified: Secondary | ICD-10-CM

## 2015-04-03 DIAGNOSIS — R05 Cough: Secondary | ICD-10-CM

## 2015-04-03 DIAGNOSIS — R0602 Shortness of breath: Secondary | ICD-10-CM | POA: Diagnosis not present

## 2015-04-03 DIAGNOSIS — J849 Interstitial pulmonary disease, unspecified: Secondary | ICD-10-CM | POA: Diagnosis not present

## 2015-04-03 MED ORDER — ALBUTEROL SULFATE 108 (90 BASE) MCG/ACT IN AEPB
2.0000 | INHALATION_SPRAY | Freq: Four times a day (QID) | RESPIRATORY_TRACT | Status: DC | PRN
Start: 2015-04-03 — End: 2015-08-14

## 2015-04-03 MED ORDER — ALBUTEROL SULFATE HFA 108 (90 BASE) MCG/ACT IN AERS: 2.0000 | INHALATION_SPRAY | Freq: Four times a day (QID) | RESPIRATORY_TRACT | Status: DC | PRN

## 2015-04-03 NOTE — Telephone Encounter (Signed)
Attempted to call pt. No answer, no option to leave a message. Will try back. 

## 2015-04-03 NOTE — Telephone Encounter (Signed)
Note written and at front desk for Patient to pick up.  Patient notified. Nothing further needed.

## 2015-04-03 NOTE — Progress Notes (Signed)
Patient here to establish care She is being treated for bronchitis-she says she feels no better Most recent pap in March was normal

## 2015-04-03 NOTE — Telephone Encounter (Signed)
Pt scheduled for appt on injection schedule for TDAP Pt coming in 04/06/15 at 3pm to have this done.  Nothing further needed.

## 2015-04-03 NOTE — Progress Notes (Signed)
[name], female   DOB: 31-Jan-1991, 24 y.o.   MRN: [id number]  [id number]  [id number]  DOB - 31-Jan-1991  CC:  Chief Complaint  Patient presents with  . Establish Care  . Bronchitis       HPI: Kayla [name] is a 24 y.o. female here today to establish medical care.  Patient was seen in the ER on 5/6 for bronchitis. She has a history of interstitial lung disease, Sjogren's, and malnutrition. Patient has gradually worsening clear productive cough for the past 2 weeks. Mild chest discomfort bilateral with coughing. No blood clot history, no recent surgeries, no coronary history, no leg swelling. She continues to have SOB and chest pain. She has completed her cycle of doxycycline. Migraine for 2 days constantly.. Subjective fevers. Overall she feels horrible. She does have a pulmonologist but has been unable to get PFT's due to severe cough. She is not currently on albuterol because her insurance is waiting on a prior authorization.   Patient has No headache, No chest pain, No abdominal pain - No Nausea, No new weakness tingling or numbness.  Allergies  Allergen Reactions  . Zithromax [Azithromycin] Cough   Past Medical History  Diagnosis Date  . CAP (community acquired pneumonia) 01/07/2015  . GERD (gastroesophageal reflux disease)   . Daily headache     "sometimes" (01/08/2015)  . Arthritis     "hands and legs" (01/08/2015)   Current Outpatient Prescriptions on File Prior to Visit  Medication Sig Dispense Refill  . diphenhydrAMINE (BENADRYL) 25 mg capsule Take 1 capsule (25 mg total) by mouth every 6 (six) hours as needed for itching (available OTC). 30 capsule 0  . doxycycline (VIBRAMYCIN) 100 MG capsule Take 1 capsule (100 mg total) by mouth 2 (two) times daily. One po bid x 7 days 14 capsule 0  . ibuprofen (ADVIL,MOTRIN) 200 MG tablet Take 600 mg by mouth every 6 (six) hours as needed for mild pain.    . Multiple Vitamins-Minerals (MULTIVITAMIN PO) Take 1  tablet by mouth daily.    . pantoprazole (PROTONIX) 40 MG tablet Take 1 tablet (40 mg total) by mouth 2 (two) times daily before a meal. 60 tablet 4  . albuterol (PROVENTIL HFA;VENTOLIN HFA) 108 (90 BASE) MCG/ACT inhaler Inhale 2 puffs into the lungs every 4 (four) hours as needed for wheezing or shortness of breath. (Patient not taking: Reported on 04/03/2015) 1 Inhaler 5  . predniSONE (DELTASONE) 20 MG tablet Take 2 tablets (40 mg total) by mouth daily with breakfast. (Patient not taking: Reported on 04/03/2015) 60 tablet 3   No current facility-administered medications on file prior to visit.   Family History  Problem Relation Age of Onset  . Diabetes Mother   . Diabetes Maternal Aunt   . Diabetes Maternal Grandmother    History   Social History  . Marital Status: Single    Spouse Name: N/A  . Number of Children: N/A  . Years of Education: N/A   Occupational History  . [employer]      Workers Compensation   Social History Main Topics  . Smoking status: Former Smoker -- 0.10 packs/day for 5 years    Types: Cigarettes    Quit date: 11/20/2014  . Smokeless tobacco: Never Used  . Alcohol Use: 0.0 oz/week    0 Standard drinks or equivalent per week     Comment: 01/08/2015 "last drink was New Year's Eve"  . Drug Use: No  . Sexual Activity: Not Currently  Other Topics Concern  . Not on file   Social History Narrative    Review of Systems: See HPI   Objective:   Filed Vitals:   04/03/15 1530  BP: 124/82  Pulse: 96  Temp: 99.1 F (37.3 C)    Physical Exam  Constitutional: She is oriented to person, place, and time. No distress.  Cardiovascular: Normal rate and regular rhythm.   Pulmonary/Chest: Effort normal. She has wheezes (LUL).  Neurological: She is alert and oriented to person, place, and time.  Skin: Skin is warm and dry. She is not diaphoretic.  '  Lab Results  Component Value Date   WBC 5.5 03/27/2015   HGB 14.0 03/27/2015   HCT 42.6 03/27/2015   MCV  83.4 03/27/2015   PLT 223 03/27/2015   Lab Results  Component Value Date   CREATININE 0.74 03/27/2015   BUN <5* 03/27/2015   NA 142 03/27/2015   K 3.5 03/27/2015   CL 104 03/27/2015   CO2 27 03/27/2015    Lab Results  Component Value Date   HGBA1C 6.0* 02/03/2015   Lipid Panel  No results found for: CHOL, TRIG, HDL, CHOLHDL, VLDL, LDLCALC     Assessment and plan:   Kayla [name] was seen today for establish care and bronchitis.  Diagnoses and all orders for this visit:  ILD (interstitial lung disease) Orders: -     Albuterol Sulfate (PROAIR RESPICLICK) 108 (90 BASE) MCG/ACT AEPB; Inhale 2 puffs into the lungs every 6 (six) hours as needed. This drug is covered by insurance. \ At this point I am unsure what else to do for patient. Last pulmonary note reveals that they are sending patient to rheumatology with hopes of controlling autoimmune/inflammatory disease to help with breathing.   SOB (shortness of breath)/Cough I have advised patient to try albuterol for now and report back to pulmonology asap for further management.  The patient was given clear instructions to go to ER or return to medical center if symptoms don't improve, worsen or new problems develop. The patient verbalized understanding. The patient was told to call to get lab results if they haven't heard anything in the next week.     Holland CommonsKECK, VALERIE, NP-C [facility] [phone] 04/03/2015, 3:38 PM

## 2015-04-03 NOTE — Telephone Encounter (Signed)
Ok to do note

## 2015-04-03 NOTE — Telephone Encounter (Signed)
Fine by me 

## 2015-04-03 NOTE — Patient Instructions (Signed)
Schedule appt with Pulmonology ASAP if inhaler does not help

## 2015-04-06 ENCOUNTER — Ambulatory Visit (INDEPENDENT_AMBULATORY_CARE_PROVIDER_SITE_OTHER): Payer: No Typology Code available for payment source

## 2015-04-06 DIAGNOSIS — Z23 Encounter for immunization: Secondary | ICD-10-CM

## 2015-04-07 ENCOUNTER — Emergency Department (HOSPITAL_COMMUNITY): Payer: No Typology Code available for payment source

## 2015-04-07 ENCOUNTER — Encounter (HOSPITAL_COMMUNITY): Payer: Self-pay

## 2015-04-07 ENCOUNTER — Emergency Department (HOSPITAL_COMMUNITY)
Admission: EM | Admit: 2015-04-07 | Discharge: 2015-04-07 | Disposition: A | Discharge status: Home / Self Care | Payer: No Typology Code available for payment source | Attending: Emergency Medicine | Admitting: Emergency Medicine

## 2015-04-07 DIAGNOSIS — Z87891 Personal history of nicotine dependence: Secondary | ICD-10-CM | POA: Diagnosis not present

## 2015-04-07 DIAGNOSIS — Z8701 Personal history of pneumonia (recurrent): Secondary | ICD-10-CM | POA: Insufficient documentation

## 2015-04-07 DIAGNOSIS — Z79899 Other long term (current) drug therapy: Secondary | ICD-10-CM | POA: Insufficient documentation

## 2015-04-07 DIAGNOSIS — M1990 Unspecified osteoarthritis, unspecified site: Secondary | ICD-10-CM | POA: Diagnosis not present

## 2015-04-07 DIAGNOSIS — K219 Gastro-esophageal reflux disease without esophagitis: Secondary | ICD-10-CM | POA: Insufficient documentation

## 2015-04-07 DIAGNOSIS — J982 Interstitial emphysema: Secondary | ICD-10-CM | POA: Diagnosis not present

## 2015-04-07 DIAGNOSIS — R079 Chest pain, unspecified: Secondary | ICD-10-CM | POA: Diagnosis present

## 2015-04-07 LAB — BASIC METABOLIC PANEL
Anion gap: 12 (ref 5–15)
CO2: 23 mmol/L (ref 22–32)
Calcium: 9.7 mg/dL (ref 8.9–10.3)
Chloride: 102 mmol/L (ref 101–111)
Creatinine, Ser: 0.67 mg/dL (ref 0.44–1.00)
GFR calc Af Amer: >60 mL/min (ref >60)
GLUCOSE: 100 mg/dL — AB (ref 65–99)
POTASSIUM: 2.8 mmol/L — AB (ref 3.5–5.1)
Sodium: 137 mmol/L (ref 135–145)

## 2015-04-07 LAB — CBC
HCT: 41.4 % (ref 36.0–46.0)
Hemoglobin: 14 g/dL (ref 12.0–15.0)
MCH: 27.2 pg (ref 26.0–34.0)
MCHC: 33.8 g/dL (ref 30.0–36.0)
MCV: 80.5 fL (ref 78.0–100.0)
PLATELETS: 248 10*3/uL (ref 150–400)
RBC: 5.14 MIL/uL — ABNORMAL HIGH (ref 3.87–5.11)
RDW: 13.4 % (ref 11.5–15.5)
WBC: 7 10*3/uL (ref 4.0–10.5)

## 2015-04-07 LAB — I-STAT TROPONIN, ED: TROPONIN I, POC: 0.06 ng/mL (ref 0.00–0.08)

## 2015-04-07 MED ORDER — OXYCODONE-ACETAMINOPHEN 5-325 MG PO TABS: 1.0000 | ORAL_TABLET | Freq: Three times a day (TID) | ORAL | Status: DC | PRN

## 2015-04-07 MED ORDER — POTASSIUM CHLORIDE CRYS ER 20 MEQ PO TBCR
40.0000 meq | EXTENDED_RELEASE_TABLET | Freq: Once | ORAL | Status: AC
  Administered 2015-04-07: 40 meq via ORAL
  Filled 2015-04-07: qty 2

## 2015-04-07 MED ORDER — OXYCODONE-ACETAMINOPHEN 5-325 MG PO TABS
2.0000 | ORAL_TABLET | Freq: Once | ORAL | Status: AC
  Administered 2015-04-07: 2 via ORAL
  Filled 2015-04-07: qty 2

## 2015-04-07 NOTE — Discharge Instructions (Signed)
Pneumomediastinum Pneumomediastinum occurs when air leaks into the mediastinum. The mediastinum is the area between the lungs, just behind the breastbone. This space contains the heart, aorta, venae cavae, esophagus, and thymus. CAUSES  Pneumomediastinum can occur on its own (spontaneous), or it can occur following an injury (traumatic). Pneumomediastinum is usually caused by a condition or injury that forces air to leak out of the lungs and into the mediastinum. Common conditions and injuries that lead to pneumomediastinum include:  Childbirth.  Chest or abdominal injuries, such as blunt or penetrating trauma.  Asthma.  Problems during scuba diving.  Problems during the use of a breathing machine (ventilator).  Inhaling illegal drugs or chemicals.  Infections in the face, neck, chest, or abdomen.  Extreme strain during coughing or vomiting.  A hole in the intestine or esophagus.  Ingesting a corrosive solvent.  Accidentally making a hole in the lung or intestine during surgery or a medical procedure.  Accidentally breathing an object into the airway. SYMPTOMS  In some cases, there may be no symptoms. A lack of symptoms is more likely with spontaneous pneumomediastinum. When symptoms are present, they may include:  Chest pain, which may run into the neck, shoulder, back, or arms.  Increased pain when moving, swallowing, or taking a deep breath.  Problems swallowing.  Problems speaking.  Vocal changes.  Shortness of breath.  Fever.  Throat or jaw pain. DIAGNOSIS  Your health care provider may suspect pneumomediastinum based on your symptoms and a physical exam. If needed, imaging tests may be done such as a chest X-ray or CT. TREATMENT  Spontaneous pneumomediastinum often gets better without any treatment. Your body will slowly reabsorb the air. In more severe cases, treatment is directed at addressing the cause. This may include antibiotic medicines to treat an  infection. In very severe cases, the air may build up and start to put pressure on the heart or lungs. In this case, you will need to stay in the hospital. One of the following procedures may be needed:  Needle aspiration. This procedure uses a needle that is inserted into the mediastinum to take out the trapped air.  Chest tube placement. This may be done if you have a collapsed lung.  Surgery to repair a hole in the intestine or esophagus. HOME CARE INSTRUCTIONS   Until your health care provider says it is okay, avoid:  Air travel.  Scuba diving.  High altitudes.  Hard physical work.  Exercise.  Do not use any tobacco products including cigarettes, chewing tobacco, or electronic cigarettes.  Do not use illegal drugs.  Only take medicine as directed by your health care provider.  If you have had surgery, a chest tube placed, or a needle aspiration, watch for drainage, redness, swelling, or pain at any incision or puncture sites. Contact your health care provider if you notice any of these symptoms. SEEK MEDICAL CARE IF: You have a fever. SEEK IMMEDIATE MEDICAL CARE IF:  You have worsening pain in the chest, neck, jaw, or arms.  You have trouble breathing.  You have new problems with speaking or swallowing. MAKE SURE YOU:  Understand these instructions.  Will watch your condition.  Will get help right away if you are not doing well or get worse. Document Released: 10/20/2008 Document Revised: 11/12/2013 Document Reviewed: 01/30/2012 [facility] Patient Information 2015 [employer] [place]. This information is not intended to replace advice given to you by your health care provider. Make sure you discuss any questions you have with your health  care provider.  

## 2015-04-07 NOTE — ED Provider Notes (Signed)
CSN: [id number]     Arrival date & time 04/07/15  1110 History   First MD Initiated Contact with Patient 04/07/15 1307     Chief Complaint  Patient presents with  . Chest Pain     (Consider location/radiation/quality/duration/timing/severity/associated sxs/prior Treatment) Patient is a 24 y.o. female presenting with chest pain. The history is provided by the patient.  Chest Pain Pain location:  Substernal area Pain quality: sharp   Pain radiates to:  Does not radiate Pain radiates to the back: no   Pain severity:  Mild Onset quality:  Gradual Timing:  Constant Progression:  Unchanged Chronicity:  New Context: at rest   Relieved by:  Nothing Worsened by:  Nothing tried Associated symptoms: cough and shortness of breath   Associated symptoms: no abdominal pain, no back pain, no dizziness and no fever     Past Medical History  Diagnosis Date  . CAP (community acquired pneumonia) 01/07/2015  . GERD (gastroesophageal reflux disease)   . Daily headache     "sometimes" (01/08/2015)  . Arthritis     "hands and legs" (01/08/2015)   Past Surgical History  Procedure Laterality Date  . Finger surgery Right 03/2014    "laceration, nerve/artery injury" 2nd digit  . Video bronchoscopy Bilateral 01/12/2015    Procedure: VIDEO BRONCHOSCOPY WITH FLUORO;  Surgeon: Leslye Peerobert S Byrum, MD;  [facility];  Service: Cardiopulmonary;  Laterality: Bilateral;   Family History  Problem Relation Age of Onset  . Diabetes Mother   . Diabetes Maternal Aunt   . Diabetes Maternal Grandmother    History  Substance Use Topics  . Smoking status: Former Smoker -- 0.10 packs/day for 5 years    Types: Cigarettes    Quit date: 11/20/2014  . Smokeless tobacco: Never Used  . Alcohol Use: 0.0 oz/week    0 Standard drinks or equivalent per week     Comment: 01/08/2015 "last drink was New Year's Eve"   OB History    Gravida Para Term Preterm AB TAB SAB Ectopic Multiple Living   0 0 0 0 0 0 0 0 0 0       Review of Systems  Constitutional: Negative for fever.  Respiratory: Positive for cough and shortness of breath.   Cardiovascular: Positive for chest pain.  Gastrointestinal: Negative for abdominal pain.  Musculoskeletal: Negative for back pain.  Neurological: Negative for dizziness.  All other systems reviewed and are negative.     Allergies  Zithromax  Home Medications   Prior to Admission medications   Medication Sig Start Date End Date Taking? Authorizing Provider  albuterol (PROVENTIL HFA;VENTOLIN HFA) 108 (90 BASE) MCG/ACT inhaler Inhale 2 puffs into the lungs every 4 (four) hours as needed for wheezing or shortness of breath. Patient not taking: Reported on 04/03/2015 03/23/15   Virgel Bouquetammy S Parrett, NP  albuterol (PROVENTIL HFA;VENTOLIN HFA) 108 (90 BASE) MCG/ACT inhaler Inhale 2 puffs into the lungs every 6 (six) hours as needed for wheezing or shortness of breath. 04/03/15   Ambrose FinlandValerie A Keck, NP  Albuterol Sulfate (PROAIR RESPICLICK) 108 (90 BASE) MCG/ACT AEPB Inhale 2 puffs into the lungs every 6 (six) hours as needed. 04/03/15   Ambrose FinlandValerie A Keck, NP  diphenhydrAMINE (BENADRYL) 25 mg capsule Take 1 capsule (25 mg total) by mouth every 6 (six) hours as needed for itching (available OTC). 01/15/15   Ripudeep Jenna LuoK Rai, MD  doxycycline (VIBRAMYCIN) 100 MG capsule Take 1 capsule (100 mg total) by mouth 2 (two) times daily. One po bid  x 7 days 03/27/15   Blane OharaJoshua Zavitz, MD  ibuprofen (ADVIL,MOTRIN) 200 MG tablet Take 600 mg by mouth every 6 (six) hours as needed for mild pain.    Historical Provider, MD  Multiple Vitamins-Minerals (MULTIVITAMIN PO) Take 1 tablet by mouth daily.    Historical Provider, MD  pantoprazole (PROTONIX) 40 MG tablet Take 1 tablet (40 mg total) by mouth 2 (two) times daily before a meal. 01/15/15   Ripudeep Jenna LuoK Rai, MD  predniSONE (DELTASONE) 20 MG tablet Take 2 tablets (40 mg total) by mouth daily with breakfast. Patient not taking: Reported on 04/03/2015 02/24/15   Kalman ShanMurali  Ramaswamy, MD   BP 132/86 mmHg  Pulse 89  Temp(Src) 98.2 F (36.8 C) (Oral)  Resp 12  Ht 5\' 3"  (1.6 m)  Wt 155 lb (70.308 kg)  BMI 27.46 kg/m2  SpO2 96%  LMP 03/22/2015 Physical Exam  Constitutional: She is oriented to person, place, and time. She appears well-developed and well-nourished. No distress.  HENT:  Head: Normocephalic and atraumatic.  Mouth/Throat: Oropharynx is clear and moist.  Eyes: EOM are normal. Pupils are equal, round, and reactive to light.  Neck: Normal range of motion. Neck supple.  Cardiovascular: Normal rate and regular rhythm.  Exam reveals no friction rub.   No murmur heard. Pulmonary/Chest: Effort normal and breath sounds normal. No respiratory distress. She has no wheezes. She has no rales.  Abdominal: Soft. She exhibits no distension. There is no tenderness. There is no rebound.  Musculoskeletal: Normal range of motion. She exhibits no edema.  Neurological: She is alert and oriented to person, place, and time.  Skin: No rash noted. She is not diaphoretic.  Nursing note and vitals reviewed.   ED Course  Procedures (including critical care time) Labs Review Labs Reviewed  CBC - Abnormal; Notable for the following:    RBC 5.14 (*)    All other components within normal limits  BASIC METABOLIC PANEL - Abnormal; Notable for the following:    Potassium 2.8 (*)    Glucose, Bld 100 (*)    BUN <5 (*)    All other components within normal limits  Rosezena SensorI-STAT TROPOININ, ED    Imaging Review Dg Chest 2 View  04/07/2015   CLINICAL DATA:  24 year old female with mid chest pain since 0600 hrs. Initial encounter. Suspected chronic interstitial lung disease.  EXAM: CHEST  2 VIEW  COMPARISON:  03/27/2015 and earlier.  FINDINGS: Continued perihilar and basilar Patchy and confluent opacity with both reticulonodular and some air space appearance. Stable lung volumes. No pneumothorax or pleural effusion. No acute opacity identified.  The mediastinal contour is stable,  but there is pneumomediastinum evident today. Mild if any subcutaneous gas tracking at the thoracic inlet and lower neck. No pneumoperitoneum.  Negative visible bowel gas pattern. No acute osseous abnormality identified.  IMPRESSION: 1. New pneumomediastinum, in this setting most likely is spontaneous pneumomediastinum (benign, self-limited) related to forced Valsalva. 2. Otherwise stable abnormal lung appearance over this series of exams, suspected due to interstitial lung disease. Progressed perihilar involvement since 09/18/2014.   Electronically Signed   By: Odessa FlemingH  Hall M.D.   On: 04/07/2015 11:42     EKG Interpretation   Date/Time:  Tuesday Apr 07 2015 11:18:32 EDT Ventricular Rate:  120 PR Interval:  130 QRS Duration: 78 QT Interval:  294 QTC Calculation: 415 R Axis:   40 Text Interpretation:  Sinus tachycardia Right atrial enlargement T wave  abnormality, consider inferior ischemia T wave abnormality, consider  anterolateral ischemia Abnormal ECG No significant change since last  tracing Confirmed by Gwendolyn Grant  MD, Alzina Golda ([id number]) on 04/07/2015 1:08:01 PM      MDM   Final diagnoses:  Pneumomediastinum    24 year old female here with sharp central chest pain. Began yesterday. No alleviating or exacerbating factors. No vomiting. Has had pneumonia recently been coughing a lot. Does have history of interstitial lung disease. Chest x-ray here shows pneumomediastinum. She states the pain radiates up into her neck. This is likely this was of her pain. This is likely benign but I'll speak with CT surgery to ask about follow-up plan. CT surgery takes nothing for them to do. Patient given pain medicine and I helped arrange follow-up for repeat chest x-ray with her pulmonary doctor in one week.  Elwin Mocha, MD 04/07/15 [phone]

## 2015-04-07 NOTE — ED Notes (Signed)
Pt here for cp to mid chest this morning since 0600. Denies any other symptoms with the pain. Pt has interstitial lung disease and is due for a lung test today at one but started having this pain.

## 2015-04-08 ENCOUNTER — Telehealth: Payer: Self-pay | Admitting: Internal Medicine

## 2015-04-08 NOTE — Telephone Encounter (Signed)
lmtcb x1 for pt. 

## 2015-04-09 NOTE — Telephone Encounter (Signed)
lmtcb for pt.  

## 2015-04-09 NOTE — Telephone Encounter (Signed)
Spoke with pt, states she wants to start applying for disability.  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  I advised that we would be happy to fill out any documentation needed within reason once this request has been initiated.  Pt understands.  Nothing further needed at this time.

## 2015-04-09 NOTE — Telephone Encounter (Signed)
Pt returned call [phone]

## 2015-04-14 ENCOUNTER — Ambulatory Visit (INDEPENDENT_AMBULATORY_CARE_PROVIDER_SITE_OTHER): Payer: No Typology Code available for payment source | Admitting: Adult Health

## 2015-04-14 ENCOUNTER — Ambulatory Visit (INDEPENDENT_AMBULATORY_CARE_PROVIDER_SITE_OTHER)
Admission: RE | Admit: 2015-04-14 | Discharge: 2015-04-14 | Disposition: A | Discharge status: Home / Self Care | Payer: No Typology Code available for payment source | Source: Ambulatory Visit | Attending: Adult Health | Admitting: Adult Health

## 2015-04-14 ENCOUNTER — Encounter: Payer: Self-pay | Admitting: Adult Health

## 2015-04-14 ENCOUNTER — Encounter: Payer: Self-pay | Admitting: Internal Medicine

## 2015-04-14 VITALS — BP 100/60 | HR 100 | Temp 99.1°F | Ht 63.0 in | Wt 155.0 lb

## 2015-04-14 DIAGNOSIS — J982 Interstitial emphysema: Secondary | ICD-10-CM

## 2015-04-14 DIAGNOSIS — J849 Interstitial pulmonary disease, unspecified: Secondary | ICD-10-CM

## 2015-04-14 NOTE — Patient Instructions (Addendum)
Continue on prednisone 40mg  daily  Follow with Rheumatology next week as planned.  CT chest tomorrow .   Follow up Dr. Marchelle Gearingamaswamy in 3-4 weeks and As needed   Please contact office for sooner follow up if symptoms do not improve or worsen or seek emergency care

## 2015-04-14 NOTE — Assessment & Plan Note (Addendum)
No sign change on cxr today  Set up for HRCT  Cough control with Delsym 2 tsp Twice daily  As needed  Cough  And hydromet As needed  (pt left without rx , will call her to see if you she would like to pick rx)  No heavy lifting  Please contact office for sooner follow up if symptoms do not improve or worsen or seek emergency care

## 2015-04-14 NOTE — Assessment & Plan Note (Addendum)
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 Marchelle Gearingamaswamy  Pt to continue on Prednisone 40mg  daily until seen by rheumatology .  Repeat CT chest -HRCT

## 2015-04-14 NOTE — Progress Notes (Signed)
Subjective:    [name], female    DOB: 1991/07/03, 24 y.o.   MRN: [id number]  HPI   OV 02/06/2015  Chief Complaint  Patient presents with  . Pulmonary Consult    Pt referred by Dr. Waymon AmatoHongalgi for ILD. Pt c/o SOB with and without activity, cough with little mucus production, and midsternal CP when lying down. Pt c/o flaky rash on right upper arm.     24 year old Afro-American female [facility] with several week history of dyspnea, chest tightness, dry cough and some nonspecific dry rash in her bilateral arms. Evaluation showed scattered groundglass opacities on CT scan of the chest. Bronchoscopy with lavage did not show any infectious organisms. I do not see evidence of a cell count and differential on the lavage. Autoimmune antibody panel was positive for Sjogren's antibodies but otherwise negative and did not see evidence of angiotensin-converting enzyme. She was discharged on 60 mg prednisone per day. She now presents for follow-up. She continues to have some dyspnea and dry cough. She is also reporting dry eyes and increased thirst. The prednisone as helped only somewhat. Overall no decompensation but only some better. The rash in her arms have not disappeared. No new issues.  Walking desaturation test 185 feet 3 laps in the office on room air: did not desat >  03/17/2015 Follow up :ER follow up / ILD  Pt returns for ER follow up . Patient was seen in the emergency room one week ago, for increased cough and shortness of breath. Chest x-ray showed a slight improvement in her bilateral basilar opacities.  She was given Levaquin for 7 days, which she has now finished. She is being followed for abnormal CT scan with scattered GGO .   Autoimmune antibody panel was positive for Sjogren's antibodies She was started on a steroid challenge at 60 mg with taper to 40 mg one month ago She has a rheumatology ov in June.  Patient says that since  she decreased her prednisone to 40 mg she has had worsening shortness of breath and chest tightness along with cough. Congestion has improved since antibiotic last week. Patient reports that she's had a migratory rash with several lesions along her arms, hips. She also complains of a rash along her face. She denies any hemoptysis, orthopnea, PND, leg swelling, fever, nausea, vomiting, diarrhea or joint swelling. Has a family history of rheumatoid arthritis in a grandfather.    OV 03/23/2015 Chief Complaint  Patient presents with  . Follow-up    Pt here after an acute visit with TP on 4/26. Pt has had echo since last OV with MR. Pt states her breathing is unchanged since OV with TP. Pt c/o prod cough with yellow and green mucus and chest tightness when coughing and SOB.    Follow-up dyspnea in the Setting of interstitial lung disease findings and autoimmune antibody panel positive for Sjogren's  I last saw her mid March 2016. I cut the prednisone down to 40 mg once a day and referred her to rheumatology. Since then she did have echocardiogram but the results have not floated to our computer system. I ordered pulmonary function test but due to logistical reasons she is yet to do this. There has been delay in seeing a rheumatologist appointment is now set up for June 2016. In the interim she did see my nurse practitioner one week ago. She was reporting more dyspnea so the nurse practitioner increased her prednisone  back to 60 mg once a day. Chest x-ray around this time did show improvement in her interstitial lung disease. However patient tells me that the increase in prednisone has not helped her dyspnea. She feels that albuterol inhaler has not helped her dyspnea but albuterol nebulizer in the office didn't help. She is not reporting any wheezing but has a mild cough. She continues to have symptoms of rash that appear and disappear. She now has a dermatology appointment scheduled with Dr. Doreen Beam on  03/27/2015. Overall she feels miserable with poor quality of life. She is frustrated with delays in a healthcare  PFT 02/06/15: unalbel t performn  04/14/2015 ER follow up : ILD  Pt seen in ER on 5/17 with chest pain .  CXR showed pneumomediastinum . Felt this happened possibly  Due to forced vlaslva. She has had nausea with dry heaves and ongoing dry cough .  Breathing and dyspnea has been stable . Remains weak. .  cxr today shows no change in pneumomediastinum .  She remains on prednisone  daily . Previously increased dose  Of steroid to  with  No benefit.  She was seen by Dermatology with bx of migratory rash. Last week.  Called Dermatology x 2 for biopsy results with no results or fax info.  Case discussed with Dr. Marchelle Gearing . Pt with follow up with Rheumatology  Next week.  No flare of cough , dyspnea, hemoptysis, fever , discolored mucus.  No change in rash.    Review of Systems  Constitutional: Negative for fever and unexpected weight change.  HENT: Negative for congestion, dental problem, ear pain, nosebleeds, postnasal drip, rhinorrhea, sinus pressure, sneezing, sore throat and trouble swallowing.   Eyes: Negative for redness and itching.  Respiratory: Positive for cough,   and shortness of breath. Negative for wheezing.   Cardiovascular: Negative for palpitations and leg swelling.  Gastrointestinal: Negative for nausea and vomiting.  Genitourinary: Negative for dysuria.  Musculoskeletal: Negative for joint swelling. +muscle weakness in arms and legs.  Skin: Negative for rash.  Neurological: Negative for headaches.  Hematological: Does not bruise/bleed easily.  Psychiatric/Behavioral: Negative for dysphoric mood. The patient is not nervous/anxious.        Objective:   Physical Exam  Constitutional: She is oriented to person, place, and time. She appears well-developed and well-nourished. No distress.  HENT:  Head: Normocephalic and atraumatic.  Right Ear:  External ear normal.  Left Ear: External ear normal.  Mouth/Throat: Oropharynx is clear and moist. No oropharyngeal exudate.  Eyes: Conjunctivae and EOM are normal. Pupils are equal, round, and reactive to light. Right eye exhibits no discharge. Left eye exhibits no discharge. No scleral icterus.  Neck: Normal range of motion. Neck supple. No JVD present. No tracheal deviation present. No thyromegaly present.  Cardiovascular: Normal rate, regular rhythm, normal heart sounds and intact distal pulses.  Exam reveals no gallop and no friction rub.   No murmur heard. Pulmonary/Chest: Effort normal and breath sounds normal. No respiratory distress. She has no wheezes. She has no rales. She exhibits no tenderness.  Abdominal: Soft. Bowel sounds are normal. She exhibits no distension and no mass. There is no tenderness. There is no rebound and no guarding.  Musculoskeletal: Normal range of motion. She exhibits no edema or tenderness.  Lymphadenopathy:    She has no cervical adenopathy.  Neurological: She is alert and oriented to person, place, and time. She has normal reflexes. No cranial nerve deficit. She exhibits normal muscle tone. Coordination normal.  Skin: Skin is warm and dry. Rash noted. She is not diaphoretic. No erythema. No pallor.  Scattered skin lesion +  Psychiatric:  Flat affect  Vitals reviewed.        Assessment & Plan:

## 2015-04-15 ENCOUNTER — Ambulatory Visit (INDEPENDENT_AMBULATORY_CARE_PROVIDER_SITE_OTHER)
Admission: RE | Admit: 2015-04-15 | Discharge: 2015-04-15 | Disposition: A | Discharge status: Home / Self Care | Payer: No Typology Code available for payment source | Source: Ambulatory Visit | Attending: Internal Medicine | Admitting: Internal Medicine

## 2015-04-15 DIAGNOSIS — J849 Interstitial pulmonary disease, unspecified: Secondary | ICD-10-CM

## 2015-04-15 DIAGNOSIS — R0600 Dyspnea, unspecified: Secondary | ICD-10-CM

## 2015-04-15 DIAGNOSIS — M3500 Sicca syndrome, unspecified: Secondary | ICD-10-CM

## 2015-04-15 MED ORDER — HYDROCODONE-HOMATROPINE 5-1.5 MG/5ML PO SYRP: 5.0000 mL | ORAL_SOLUTION | Freq: Four times a day (QID) | ORAL | Status: DC | PRN

## 2015-04-15 NOTE — Addendum Note (Signed)
Addended by: Boone MasterJONES, JESSICA E on: 04/15/2015 09:50 AM   Modules accepted: Orders

## 2015-04-17 ENCOUNTER — Telehealth: Payer: Self-pay | Admitting: Internal Medicine

## 2015-04-17 NOTE — Telephone Encounter (Signed)
Patient called to request a prescription for Percocet 523/512mg , please f/u with pt.

## 2015-04-20 NOTE — Telephone Encounter (Signed)
Please call patient and inform her that we do not prescribe strong narcotic pain medication. Advised her that she may have a prescription for tramadol 50 mg to take twice a day as needed for pain

## 2015-04-22 ENCOUNTER — Telehealth: Payer: Self-pay | Admitting: Internal Medicine

## 2015-04-22 DIAGNOSIS — J849 Interstitial pulmonary disease, unspecified: Secondary | ICD-10-CM

## 2015-04-22 NOTE — Telephone Encounter (Signed)
Kayla [name] (cc to Marathon Oilammy Parrett for FYI update)  Took call from Dr Corliss Skainseveshwar on Kayla [name]. We went over CT chest and epic system autoimmune . Dr Corliss Skainseveshwar feels SsA/SsB can be non speicific. The skin bx done post prednisone is non diagnostic. She feels she is unable t give a unifying diagnosis. Meanwhile patient is very symptomatic with dyspnea (CTA feb 2016 was negative for PE) and JHRCT 04/15/15 shows changing infiltrates along with pneumomdiastinum. We feel that patient needs tertiary care help from rheum stand point. In my opinion patient neds surgical lung bx and so best done in 1 center with joint pulm and rheum  [facility] to [facility]. If unable then to [facility] for [facility] at[facility]   Meanwhile, retest for PE - I am trying to see If Dr Corliss Skainseveshwar cn do a d-dimer on her.   Send note back  Thanks  Kayla [name], M.D., [facility].P Pulmonary and Critical Care Medicine Staff Physician [facility] [place] [facility]monary and Critical [facility] Pager: [phone], If no answer or between  15:00h - 7:00h: call [phone]  04/22/2015 11:51 AM

## 2015-04-22 NOTE — Telephone Encounter (Signed)
Called # provided below by Johny Drillinghan.  This was to pt.  In the middle of speaking with pt, call was disconnected.  Will forward to WayneElise.

## 2015-04-22 NOTE — Telephone Encounter (Signed)
Spoke with pt and advised of Dr Jane Canaryamaswamy's recommendations.  Referral placed.  Also pt requested copy of ov notes for disability.  Copies left at front desk for pick up.

## 2015-04-22 NOTE — Telephone Encounter (Signed)
Needs list of diagnosis [phone]

## 2015-04-23 ENCOUNTER — Telehealth: Payer: Self-pay | Admitting: Internal Medicine

## 2015-04-23 DIAGNOSIS — R0600 Dyspnea, unspecified: Secondary | ICD-10-CM

## 2015-04-23 NOTE — Telephone Encounter (Signed)
Called and spoke to pt. Informed her of the recs per MR. Order placed. Pt aware to go to basement between 8-5. Pt verbalized understanding and is aware we will contact her once results are available. Nothing further needed at this time.

## 2015-04-23 NOTE — Telephone Encounter (Signed)
Patient left Dr Corliss Skainseveshwar office befor d-dimer could be done. Can you order one pleasE?  Thanks  Dr. Kalman ShanMurali Khian Remo, M.D., [facility] Pulmonary and Critical Care Medicine Staff Physician [place] [place] [facility] and Critical Care Pager: [phone], If no answer or between  15:00h - 7:00h: call [phone]  04/23/2015 4:33 PM

## 2015-04-28 DIAGNOSIS — M3500 Sicca syndrome, unspecified: Secondary | ICD-10-CM | POA: Insufficient documentation

## 2015-04-29 ENCOUNTER — Telehealth: Payer: Self-pay | Admitting: Internal Medicine

## 2015-04-29 NOTE — Telephone Encounter (Signed)
Pt called requesting medication refill for oxyCODONE-acetaminophen (PERCOCET/ROXICET) 5-325 MG per tablet. Patient states medication was prescribed at the ER and helps with chest pains. Please f/u with pt

## 2015-04-29 NOTE — Telephone Encounter (Signed)
We do noto fill strong narcotic pain medication here. Please explain to patient that she may have Tramadol for pain if needed

## 2015-05-02 ENCOUNTER — Telehealth: Payer: Self-pay | Admitting: Internal Medicine

## 2015-05-02 DIAGNOSIS — R7989 Other specified abnormal findings of blood chemistry: Secondary | ICD-10-CM

## 2015-05-02 NOTE — Telephone Encounter (Signed)
[name] yet to do d-dimer. Please have her do it asap. Also on OV 05/12/15 - need to do cxr 2 view

## 2015-05-05 ENCOUNTER — Other Ambulatory Visit: Payer: No Typology Code available for payment source

## 2015-05-05 DIAGNOSIS — R0600 Dyspnea, unspecified: Secondary | ICD-10-CM

## 2015-05-05 NOTE — Telephone Encounter (Signed)
D dimer done today. Results still pending. FYI to MR

## 2015-05-06 LAB — D-DIMER, QUANTITATIVE: D-Dimer, Quant: 1.93 ug/mL-FEU — ABNORMAL HIGH (ref 0.00–0.48)

## 2015-05-07 NOTE — Telephone Encounter (Signed)
Pt calling again for follow up on refill request. Please f/u with pt.

## 2015-05-08 DIAGNOSIS — M359 Systemic involvement of connective tissue, unspecified: Secondary | ICD-10-CM | POA: Insufficient documentation

## 2015-05-10 NOTE — Telephone Encounter (Signed)
Dd-dimer significantly elevated. Please have her do CT angio chest - rule out PE - have her do ASAP

## 2015-05-11 ENCOUNTER — Telehealth: Payer: Self-pay | Admitting: Internal Medicine

## 2015-05-11 ENCOUNTER — Ambulatory Visit (INDEPENDENT_AMBULATORY_CARE_PROVIDER_SITE_OTHER)
Admission: RE | Admit: 2015-05-11 | Discharge: 2015-05-11 | Disposition: A | Discharge status: Home / Self Care | Payer: No Typology Code available for payment source | Source: Ambulatory Visit | Attending: Internal Medicine | Admitting: Internal Medicine

## 2015-05-11 ENCOUNTER — Inpatient Hospital Stay: Admission: RE | Admit: 2015-05-11 | Payer: Self-pay | Source: Ambulatory Visit

## 2015-05-11 DIAGNOSIS — R791 Abnormal coagulation profile: Secondary | ICD-10-CM | POA: Diagnosis not present

## 2015-05-11 DIAGNOSIS — R7989 Other specified abnormal findings of blood chemistry: Secondary | ICD-10-CM

## 2015-05-11 MED ORDER — IOHEXOL 350 MG/ML SOLN
80.0000 mL | Freq: Once | INTRAVENOUS | Status: AC | PRN
  Administered 2015-05-11: 80 mL via INTRAVENOUS

## 2015-05-11 NOTE — Telephone Encounter (Signed)
Spoke with pt, she is aware of lab results and recs.  Stat ct angio chest order placed.  Nothing further needed.

## 2015-05-11 NOTE — Telephone Encounter (Signed)
Pt had STAT CT Angio today Received call report from [place] in [place]: IMPRESSION: 1. Negative for pulmonary embolus. 2. Persistent pneumomediastinum. 3. Complex pulmonary parenchymal pattern, as described above, unchanged from 04/15/2015. 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  MR not in office - discussed with TP.  Okay for pt leave Pt was scheduled to see MR on 6/21 but this appt has been cancelled ?? Called spoke with patient, she reported she cancelled the 6/21 d/t conflicting appts with another provider >> appt [facility] w/ MR for Friday 6/21 at 11am  Will sign and forward to MR as [facility]

## 2015-05-11 NOTE — Telephone Encounter (Signed)
CT shows no blood clot but the ILD still persists 05/11/2015   Please give her appt 05/15/15 to come and see me - I opened clinic Friday 05/15/15   Dr. Kalman Shan, M.D., F.C.C.P Pulmonary and Critical Care Medicine Staff Physician [facility] [place] Pul[facility] and Critical [facility] Pager: [phone], If no answer or between  15:00h - 7:00h: call [phone]  05/11/2015 5:09 PM     Ct Angio Chest W/cm &/or Wo Cm  05/11/2015   CLINICAL DATA:  Shortness of breath and pain under right breast for a few days. Elevated D-dimer and history of community acquired pneumonia as well as interstitial lung disease. Evaluate for pulmonary embolus.  EXAM: CT ANGIOGRAPHY CHEST WITH CONTRAST  TECHNIQUE: Multidetector CT imaging of the chest was performed using the standard protocol during bolus administration of intravenous contrast. Multiplanar CT image reconstructions and MIPs were obtained to evaluate the vascular anatomy.  CONTRAST:  62mL OMNIPAQUE IOHEXOL 350 MG/ML SOLN  COMPARISON:  04/15/2015 and 01/07/2015.  FINDINGS: Mediastinum/Nodes: Negative for pulmonary embolus. Pneumomediastinum persists. No pathologically enlarged mediastinal lymph nodes. Bi hilar lymphoid tissue. No axillary adenopathy. Heart is normal in size. No pericardial effusion.  Lungs/Pleura: Pulmonary parenchymal pattern of patchy upper and midlung zone predominant ground-glass superimposed on fibrotic/cystic subpleural pulmonary parenchymal changes is unchanged from 04/15/2015. Vague ill-defined micro nodularity in the lung bases is again queried, left greater than right. No pleural fluid. Airway is unremarkable.  Upper abdomen: Visualized portions of the liver, adrenal glands, kidneys, spleen, pancreas and stomach are grossly unremarkable.  Musculoskeletal: No worrisome lytic or sclerotic lesions.  Review of the MIP images confirms the above findings.  IMPRESSION: 1. Negative for pulmonary embolus. 2. Persistent  pneumomediastinum. 3. Complex pulmonary parenchymal pattern, as described above, unchanged from 04/15/2015. 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   Electronically Signed   By: Leanna Battles M.D.   On: 05/11/2015 16:27

## 2015-05-11 NOTE — Telephone Encounter (Signed)
Thanks for putting her in 05/15/15. See my other phone nnote I sent -nothing new from this phone note but just fyi

## 2015-05-11 NOTE — Telephone Encounter (Signed)
Pt aware of results-scheduled already for Friday.  Nothing further needed.

## 2015-05-12 ENCOUNTER — Ambulatory Visit: Payer: Self-pay | Admitting: Internal Medicine

## 2015-05-15 ENCOUNTER — Ambulatory Visit (INDEPENDENT_AMBULATORY_CARE_PROVIDER_SITE_OTHER): Payer: No Typology Code available for payment source | Admitting: Internal Medicine

## 2015-05-15 ENCOUNTER — Encounter: Payer: Self-pay | Admitting: Internal Medicine

## 2015-05-15 VITALS — BP 120/84 | HR 99 | Ht 63.0 in | Wt 153.0 lb

## 2015-05-15 DIAGNOSIS — J849 Interstitial pulmonary disease, unspecified: Secondary | ICD-10-CM

## 2015-05-15 DIAGNOSIS — R21 Rash and other nonspecific skin eruption: Secondary | ICD-10-CM | POA: Diagnosis not present

## 2015-05-15 DIAGNOSIS — J9611 Chronic respiratory failure with hypoxia: Secondary | ICD-10-CM | POA: Diagnosis not present

## 2015-05-15 NOTE — Progress Notes (Signed)
Subjective:    [name], female    DOB: [birth year], 24 y.o.   MRN: [id number]  HPI  ILD with autoimmune features - . On high dose empoiric prednisone Feb 2016 - Bronch TBBx  - "beneign lung tissue" ECHO 02/10/15 - no pulm htn April 2016 - ANA 1:160, Positive , ACE84, A SSA/SSB > 8, RNP, ANCA, ENA, DS-DNA, JO1- negatie  OV 05/15/2015  Chief Complaint  Patient presents with  . Follow-up    Pt is now on 4lpm O2 with activity and 4lpm while sleeping. Pt c/o mild DOE, prod cough with clear and white mucus and CP/tightness at times.      Last seen by me 04/14/2015. Since then she has developed pneumomediastinum because of persistent cough and shortness of breath. The interim she did see [facility] dermatology and did have a skin biopsy which showed  ? vasculits (I remember reviewing this report but cannot find it in media sectin in EMR). She continues on 40 mg prednisone daily. In the interim she did see rheumatologist Dr. Corliss Skains who discussed the situation with me. We mutually agreed that the situation is complex and referred her to [facility]. She however did autpimmune panel. She saw Dr. Gaynell Face 04/28/15 at [name]. I reviewd his notes and her story is depicted accurately byt him.   [facility] repeated autpimmune and ANA - positive 1:2560 (much higher than with [place] in April 2016), ANCA positive but only mild MPO is 21, RF - positive 29, Anti-RO/Anti-LA positive  .   She had PFT - FVC 1.57L/42%, DLCO 6.4/28% - severe ILD  She was starterd on 4L [place] on 04/28/15 with advise to continue pred and rheum [facility] referral made.    She had bronchoscopy 05/14/15 - Clear fluids with Lymphs 23%, Mac 22% and down. , PMN 11% and up.  . I  repeated a CT scan of the chest angiogram 6.20/16 because of elevated d-dimer and we ruled out pulmonary embolism but a pneumomediastinum and bilateral infiltrates persist unchanged. . She continues to have significant dyspnea. She is due to see her  rheumatologist at [facility] and there is talk about adding a second immunomodulators.  She is frustrated by situation. Overall she is no better.   She is open to having right heart catheterization in getting to the bottom of her problems.   Review of Systems  Constitutional: Negative for fever and unexpected weight change.  HENT: Negative for congestion, dental problem, ear pain, nosebleeds, postnasal drip, rhinorrhea, sinus pressure, sneezing, sore throat and trouble swallowing.   Eyes: Negative for redness and itching.  Respiratory: Positive for cough, chest tightness and shortness of breath. Negative for wheezing.   Cardiovascular: Negative for palpitations and leg swelling.  Gastrointestinal: Negative for nausea and vomiting.  Genitourinary: Negative for dysuria.  Musculoskeletal: Negative for joint swelling.  Skin: Negative for rash.  Neurological: Negative for headaches.  Hematological: Does not bruise/bleed easily.  Psychiatric/Behavioral: Negative for dysphoric mood. The patient is not nervous/anxious.        Objective:   Physical Exam  Constitutional: She is oriented to person, place, and time. She appears well-developed and well-nourished. No distress.  HENT:  Head: Normocephalic and atraumatic.  Right Ear: External ear normal.  Left Ear: External ear normal.  Mouth/Throat: Oropharynx is clear and moist. No oropharyngeal exudate.  o2 on - first time Somewhat cushingoid  Eyes: Conjunctivae and EOM are normal. Pupils are equal, round, and reactive to light. Right eye exhibits no  discharge. Left eye exhibits no discharge. No scleral icterus.  Neck: Normal range of motion. Neck supple. No JVD present. No tracheal deviation present. No thyromegaly present.  Cardiovascular: Normal rate, regular rhythm, normal heart sounds and intact distal pulses.  Exam reveals no gallop and no friction rub.   No murmur heard. Pulmonary/Chest: Effort normal and breath sounds normal. No  respiratory distress. She has no wheezes. She has no rales. She exhibits no tenderness.  No obvious crackles  Abdominal: Soft. Bowel sounds are normal. She exhibits no distension and no mass. There is no tenderness. There is no rebound and no guarding.  No loud P2  Musculoskeletal: Normal range of motion. She exhibits no edema or tenderness.  Lymphadenopathy:    She has no cervical adenopathy.  Neurological: She is alert and oriented to person, place, and time. She has normal reflexes. No cranial nerve deficit. She exhibits normal muscle tone. Coordination normal.  Skin: Skin is warm and dry. Rash noted. She is not diaphoretic. No erythema. No pallor.  Malar rash present Rash present on shoulders and scattered Dry rash  Psychiatric: She has a normal mood and affect. Her behavior is normal. Judgment and thought content normal.  Vitals reviewed.   Filed Vitals:   05/15/15 1113  BP: 120/84  Pulse: 99  Height:  (1.6 m)  Weight: 153 lb (69.4 kg)  SpO2: 98%         Assessment & Plan:     ICD-9-CM ICD-10-CM   1. ILD (interstitial lung disease) 515 J84.9   2. Chronic respiratory failure with hypoxia 518.83 J96.11    799.02    3. Rash and nonspecific skin eruption 782.1 R21   4. Malar rash 782.1 R21    She hs developed progressive hypoxemia and prednisone resistant ILD features , and progressive skin findings despite high dose prednisone. Also, her ANA titers have jumped up dramatically while on prednisone (ANA 1:160 in April 2016 at [facility] -> 1:2500 at [facility] in June 2016). She is also having other antibodies now positive at [facility] that were negative with [place] in April 2016 despite prednisone.  I agree with her report of  DR Gaynell Face assessment that she has a tough form of SLE.   She is rapidly progressiing and at risk for many organs are being threatened  At this point  - I would suppor her ILD mgmt from [facility]  - I would support rheum mgmt from [facility]  - COntinue prednisone  and  O2 4L  However, I would offer her reight heart catheterixzation here in [place] ASAP. Though echo ws normal March 2016 given her progressive course and high risk for PAH and that being an entitiy that can actually be Rx with minimal side effects and improve her quality of life RHC is indicated.   She is in agreement with plan (update 05/21/2015 - d/w Dr Gaynell Face at [facility] - await [facility] rheum appt before deciding on rhc)  > 50% of this > 25 min visit spent in face to face counseling or coordination of care    Dr. Kalman Shan, M.D., [facility].C.P Pulmonary and Critical Care Medicine Staff Physician [facility] [place] Pulmonary and Critical Care Pager: [phone], If no answer or between  15:00h - 7:00h: call [phone]  05/16/2015 10:48 AM

## 2015-05-15 NOTE — Patient Instructions (Addendum)
ICD-9-CM ICD-10-CM   1. ILD (interstitial lung disease) 515 J84.9   2. Chronic respiratory failure with hypoxia 518.83 J96.11    799.02    3. Rash and nonspecific skin eruption 782.1 R21   4. Malar rash 782.1 R21     Continue oxygen 4 L By pulse oximetry on [employer] or [facility]  - Keep oxygen levels greater than 88% Continue prednisone 40 mg per day Glad he went to [facility] and saw pulmonology Dr. Frederik Schmidt you're going to see [facility] rheumatology  - Suspect they will start you on an additional immunomodulators treatment I strongly think you need right heart catheterization to rule out pulmonary hypertension  - We'll confer with Dr. Gaynell Face and refer you locally in [facility] for right heart catheterization  Follow-up  - Await my call next week about referral for right heart catheterization - Keep up with all [facility] appointments - Return to see me in 2 months or sooner if needed

## 2015-05-18 ENCOUNTER — Other Ambulatory Visit: Payer: Self-pay | Admitting: Adult Health

## 2015-05-19 ENCOUNTER — Telehealth: Payer: Self-pay | Admitting: Internal Medicine

## 2015-05-19 MED ORDER — HYDROCODONE-HOMATROPINE 5-1.5 MG/5ML PO SYRP: 5.0000 mL | ORAL_SOLUTION | Freq: Four times a day (QID) | ORAL | Status: DC | PRN

## 2015-05-19 NOTE — Telephone Encounter (Signed)
Yeah fine to refill to hydromet

## 2015-05-19 NOTE — Telephone Encounter (Signed)
rx refill ok'd for Hydromet by MR. Will be signed by DOD. Pt informed she will have to pick up prescription. Nothing further needed.

## 2015-05-19 NOTE — Telephone Encounter (Signed)
Patient requesting refill on her cough syrup. Last refilled: 04/15/2015 Last OV: 05/15/2015 Next OV: 07/20/2015  MR - ok to refill?

## 2015-05-21 ENCOUNTER — Telehealth: Payer: Self-pay | Admitting: Internal Medicine

## 2015-05-21 DIAGNOSIS — J9611 Chronic respiratory failure with hypoxia: Secondary | ICD-10-CM | POA: Insufficient documentation

## 2015-05-21 NOTE — Telephone Encounter (Signed)
Kayla PaneElise  Please tell Kayla PonsLashonna [name] thatI emailed and [facility] to see [facility] rheum first before deciding on right heart cath. So, for her keep up that appt and to see me in 2 months  Dr. Kalman ShanMurali Raquelle Pietro, M.D., [facility].C.P Pulmonary and Critical Care Medicine Staff Physician [facility] [place] Pulmonary and Critical Care Pager: [phone], If no answer or between  15:00h - 7:00h: call [phone]  05/21/2015 9:23 PM

## 2015-05-22 NOTE — Telephone Encounter (Signed)
Called and spoke to pt. Informed her of the recs per MR. Pt verbalized understanding and denied any further questions or concerns at this time.   

## 2015-05-26 ENCOUNTER — Telehealth: Payer: Self-pay | Admitting: *Deleted

## 2015-05-26 MED ORDER — FLUCONAZOLE 150 MG PO TABS: 150.0000 mg | ORAL_TABLET | Freq: Once | ORAL | Status: DC

## 2015-05-26 NOTE — Telephone Encounter (Signed)
Left on pt voicemail Rx sent and to make OV if no relief

## 2015-05-26 NOTE — Telephone Encounter (Signed)
Pt called c/o yeast infection itching and white discharge. Requesting diflucan Rx please advise

## 2015-05-26 NOTE — Telephone Encounter (Signed)
Okay, Diflucan 150 by mouth 1 dose office visit if no relief

## 2015-06-01 ENCOUNTER — Emergency Department (HOSPITAL_COMMUNITY)
Admission: EM | Admit: 2015-06-01 | Discharge: 2015-06-01 | Disposition: A | Discharge status: Home / Self Care | Payer: No Typology Code available for payment source | Attending: Emergency Medicine | Admitting: Emergency Medicine

## 2015-06-01 ENCOUNTER — Encounter (HOSPITAL_COMMUNITY): Payer: Self-pay | Admitting: Nurse Practitioner

## 2015-06-01 ENCOUNTER — Emergency Department (HOSPITAL_COMMUNITY): Payer: No Typology Code available for payment source

## 2015-06-01 DIAGNOSIS — S134XXA Sprain of ligaments of cervical spine, initial encounter: Secondary | ICD-10-CM | POA: Diagnosis not present

## 2015-06-01 DIAGNOSIS — K219 Gastro-esophageal reflux disease without esophagitis: Secondary | ICD-10-CM | POA: Diagnosis not present

## 2015-06-01 DIAGNOSIS — Y9389 Activity, other specified: Secondary | ICD-10-CM | POA: Insufficient documentation

## 2015-06-01 DIAGNOSIS — S299XXA Unspecified injury of thorax, initial encounter: Secondary | ICD-10-CM | POA: Insufficient documentation

## 2015-06-01 DIAGNOSIS — Z8701 Personal history of pneumonia (recurrent): Secondary | ICD-10-CM | POA: Diagnosis not present

## 2015-06-01 DIAGNOSIS — Z7952 Long term (current) use of systemic steroids: Secondary | ICD-10-CM | POA: Diagnosis not present

## 2015-06-01 DIAGNOSIS — M1990 Unspecified osteoarthritis, unspecified site: Secondary | ICD-10-CM | POA: Insufficient documentation

## 2015-06-01 DIAGNOSIS — M546 Pain in thoracic spine: Secondary | ICD-10-CM

## 2015-06-01 DIAGNOSIS — Z87891 Personal history of nicotine dependence: Secondary | ICD-10-CM | POA: Insufficient documentation

## 2015-06-01 DIAGNOSIS — S39012A Strain of muscle, fascia and tendon of lower back, initial encounter: Secondary | ICD-10-CM | POA: Insufficient documentation

## 2015-06-01 DIAGNOSIS — Y998 Other external cause status: Secondary | ICD-10-CM | POA: Diagnosis not present

## 2015-06-01 DIAGNOSIS — Z79899 Other long term (current) drug therapy: Secondary | ICD-10-CM | POA: Insufficient documentation

## 2015-06-01 DIAGNOSIS — Y92410 Unspecified street and highway as the place of occurrence of the external cause: Secondary | ICD-10-CM | POA: Insufficient documentation

## 2015-06-01 DIAGNOSIS — S139XXA Sprain of joints and ligaments of unspecified parts of neck, initial encounter: Secondary | ICD-10-CM

## 2015-06-01 DIAGNOSIS — S199XXA Unspecified injury of neck, initial encounter: Secondary | ICD-10-CM | POA: Diagnosis present

## 2015-06-01 HISTORY — DX: Systemic lupus erythematosus, unspecified: M32.9

## 2015-06-01 HISTORY — DX: Reserved for concepts with insufficient information to code with codable children: IMO0002

## 2015-06-01 MED ORDER — HYDROCODONE-ACETAMINOPHEN 5-325 MG PO TABS: 1.0000 | ORAL_TABLET | Freq: Four times a day (QID) | ORAL | Status: DC | PRN

## 2015-06-01 MED ORDER — NAPROXEN 500 MG PO TABS: 500.0000 mg | ORAL_TABLET | Freq: Two times a day (BID) | ORAL | Status: DC

## 2015-06-01 MED ORDER — METHOCARBAMOL 500 MG PO TABS: 500.0000 mg | ORAL_TABLET | Freq: Two times a day (BID) | ORAL | Status: DC

## 2015-06-01 MED ORDER — OXYCODONE-ACETAMINOPHEN 5-325 MG PO TABS
1.0000 | ORAL_TABLET | Freq: Once | ORAL | Status: AC
  Administered 2015-06-01: 1 via ORAL
  Filled 2015-06-01: qty 1
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
## 2015-06-01 NOTE — ED Notes (Signed)
Pt  Called to report she vomited up pain meds but could not find pill . Pt reports Lt leg still hurts.
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
## 2015-06-01 NOTE — ED Notes (Signed)
Declined W/C at D/C and was escorted to lobby by RN. 

## 2015-06-12 ENCOUNTER — Ambulatory Visit (INDEPENDENT_AMBULATORY_CARE_PROVIDER_SITE_OTHER): Payer: No Typology Code available for payment source | Admitting: Women's Health

## 2015-06-12 ENCOUNTER — Encounter: Payer: Self-pay | Admitting: Women's Health

## 2015-06-12 VITALS — BP 160/70 | Wt 157.0 lb

## 2015-06-12 DIAGNOSIS — O3680X0 Pregnancy with inconclusive fetal viability, not applicable or unspecified: Secondary | ICD-10-CM | POA: Diagnosis not present

## 2015-06-12 DIAGNOSIS — A499 Bacterial infection, unspecified: Secondary | ICD-10-CM

## 2015-06-12 DIAGNOSIS — N912 Amenorrhea, unspecified: Secondary | ICD-10-CM

## 2015-06-12 DIAGNOSIS — B9689 Other specified bacterial agents as the cause of diseases classified elsewhere: Secondary | ICD-10-CM

## 2015-06-12 DIAGNOSIS — N760 Acute vaginitis: Secondary | ICD-10-CM | POA: Diagnosis not present

## 2015-06-12 LAB — WET PREP FOR TRICH, YEAST, CLUE
TRICH WET PREP: NONE SEEN
WBC, Wet Prep HPF POC: NONE SEEN
Yeast Wet Prep HPF POC: NONE SEEN

## 2015-06-12 MED ORDER — METRONIDAZOLE 0.75 % VA GEL: VAGINAL | Status: DC

## 2015-06-12 NOTE — Patient Instructions (Signed)
First Trimester of Pregnancy The first trimester of pregnancy is from week 1 until the end of week 12 (months 1 through 3). A week after a sperm fertilizes an egg, the egg will implant on the wall of the uterus. This embryo will begin to develop into a baby. Genes from you and your partner are forming the baby. The female genes determine whether the baby is a boy or a girl. At 6-8 weeks, the eyes and face are formed, and the heartbeat can be seen on ultrasound. At the end of 12 weeks, all the baby's organs are formed.  Now that you are pregnant, you will want to do everything you can to have a healthy baby. Two of the most important things are to get good prenatal care and to follow your health care provider's instructions. Prenatal care is all the medical care you receive before the baby's birth. This care will help prevent, find, and treat any problems during the pregnancy and childbirth. BODY CHANGES Your body goes through many changes during pregnancy. The changes vary from woman to woman.   You may gain or lose a couple of pounds at first.  You may feel sick to your stomach (nauseous) and throw up (vomit). If the vomiting is uncontrollable, call your health care provider.  You may tire easily.  You may develop headaches that can be relieved by medicines approved by your health care provider.  You may urinate more often. Painful urination may mean you have a bladder infection.  You may develop heartburn as a result of your pregnancy.  You may develop constipation because certain hormones are causing the muscles that push waste through your intestines to slow down.  You may develop hemorrhoids or swollen, bulging veins (varicose veins).  Your breasts may begin to grow larger and become tender. Your nipples may stick out more, and the tissue that surrounds them (areola) may become darker.  Your gums may bleed and may be sensitive to brushing and flossing.  Dark spots or blotches (chloasma,  mask of pregnancy) may develop on your face. This will likely fade after the baby is born.  Your menstrual periods will stop.  You may have a loss of appetite.  You may develop cravings for certain kinds of food.  You may have changes in your emotions from day to day, such as being excited to be pregnant or being concerned that something may go wrong with the pregnancy and baby.  You may have more vivid and strange dreams.  You may have changes in your hair. These can include thickening of your hair, rapid growth, and changes in texture. Some women also have hair loss during or after pregnancy, or hair that feels dry or thin. Your hair will most likely return to normal after your baby is born. WHAT TO EXPECT AT YOUR PRENATAL VISITS During a routine prenatal visit:  You will be weighed to make sure you and the baby are growing normally.  Your blood pressure will be taken.  Your abdomen will be measured to track your baby's growth.  The fetal heartbeat will be listened to starting around week 10 or 12 of your pregnancy.  Test results from any previous visits will be discussed. Your health care provider may ask you:  How you are feeling.  If you are feeling the baby move.  If you have had any abnormal symptoms, such as leaking fluid, bleeding, severe headaches, or abdominal cramping.  If you have any questions. Other tests   that may be performed during your first trimester include:  Blood tests to find your blood type and to check for the presence of any previous infections. They will also be used to check for low iron levels (anemia) and Rh antibodies. Later in the pregnancy, blood tests for diabetes will be done along with other tests if problems develop.  Urine tests to check for infections, diabetes, or protein in the urine.  An ultrasound to confirm the proper growth and development of the baby.  An amniocentesis to check for possible genetic problems.  Fetal screens for  spina bifida and Down syndrome.  You may need other tests to make sure you and the baby are doing well. HOME CARE INSTRUCTIONS  Medicines  Follow your health care provider's instructions regarding medicine use. Specific medicines may be either safe or unsafe to take during pregnancy.  Take your prenatal vitamins as directed.  If you develop constipation, try taking a stool softener if your health care provider approves. Diet  Eat regular, well-balanced meals. Choose a variety of foods, such as meat or vegetable-based protein, fish, milk and low-fat dairy products, vegetables, fruits, and whole grain breads and cereals. Your health care provider will help you determine the amount of weight gain that is right for you.  Avoid raw meat and uncooked cheese. These carry germs that can cause birth defects in the baby.  Eating four or five small meals rather than three large meals a day may help relieve nausea and vomiting. If you start to feel nauseous, eating a few soda crackers can be helpful. Drinking liquids between meals instead of during meals also seems to help nausea and vomiting.  If you develop constipation, eat more high-fiber foods, such as fresh vegetables or fruit and whole grains. Drink enough fluids to keep your urine clear or pale yellow. Activity and Exercise  Exercise only as directed by your health care provider. Exercising will help you:  Control your weight.  Stay in shape.  Be prepared for labor and delivery.  Experiencing pain or cramping in the lower abdomen or low back is a good sign that you should stop exercising. Check with your health care provider before continuing normal exercises.  Try to avoid standing for long periods of time. Move your legs often if you must stand in one place for a long time.  Avoid heavy lifting.  Wear low-heeled shoes, and practice good posture.  You may continue to have sex unless your health care provider directs you  otherwise. Relief of Pain or Discomfort  Wear a good support bra for breast tenderness.   Take warm sitz baths to soothe any pain or discomfort caused by hemorrhoids. Use hemorrhoid cream if your health care provider approves.   Rest with your legs elevated if you have leg cramps or low back pain.  If you develop varicose veins in your legs, wear support hose. Elevate your feet for 15 minutes, 3-4 times a day. Limit salt in your diet. Prenatal Care  Schedule your prenatal visits by the twelfth week of pregnancy. They are usually scheduled monthly at first, then more often in the last 2 months before delivery.  Write down your questions. Take them to your prenatal visits.  Keep all your prenatal visits as directed by your health care provider. Safety  Wear your seat belt at all times when driving.  Make a list of emergency phone numbers, including numbers for family, friends, the hospital, and police and fire departments. General Tips    Ask your health care provider for a referral to a local prenatal education class. Begin classes no later than at the beginning of month 6 of your pregnancy.  Ask for help if you have counseling or nutritional needs during pregnancy. Your health care provider can offer advice or refer you to specialists for help with various needs.  Do not use hot tubs, steam rooms, or saunas.  Do not douche or use tampons or scented sanitary pads.  Do not cross your legs for long periods of time.  Avoid cat litter boxes and soil used by cats. These carry germs that can cause birth defects in the baby and possibly loss of the fetus by miscarriage or stillbirth.  Avoid all smoking, herbs, alcohol, and medicines not prescribed by your health care provider. Chemicals in these affect the formation and growth of the baby.  Schedule a dentist appointment. At home, brush your teeth with a soft toothbrush and be gentle when you floss. SEEK MEDICAL CARE IF:   You have  dizziness.  You have mild pelvic cramps, pelvic pressure, or nagging pain in the abdominal area.  You have persistent nausea, vomiting, or diarrhea.  You have a bad smelling vaginal discharge.  You have pain with urination.  You notice increased swelling in your face, hands, legs, or ankles. SEEK IMMEDIATE MEDICAL CARE IF:   You have a fever.  You are leaking fluid from your vagina.  You have spotting or bleeding from your vagina.  You have severe abdominal cramping or pain.  You have rapid weight gain or loss.  You vomit blood or material that looks like coffee grounds.  You are exposed to [place] measles and have never had them.  You are exposed to fifth disease or chickenpox.  You develop a severe headache.  You have shortness of breath.  You have any kind of trauma, such as from a fall or a car accident. Document Released: 11/01/2001 Document Revised: 03/24/2014 Document Reviewed: 09/17/2013 [facility] Patient Information 2015 [place], [place]. This information is not intended to replace advice given to you by your health care provider. Make sure you discuss any questions you have with your health care provider. Bacterial Vaginosis Bacterial vaginosis is an infection of the vagina. It happens when too many of certain germs (bacteria) grow in the vagina. HOME CARE  Take your medicine as told by your doctor.  Finish your medicine even if you start to feel better.  Do not have sex until you finish your medicine and are better.  Tell your sex partner that you have an infection. They should see their doctor for treatment.  Practice safe sex. Use condoms. Have only one sex partner. GET HELP IF:  You are not getting better after 3 days of treatment.  You have more grey fluid (discharge) coming from your vagina than before.  You have more pain than before.  You have a fever. MAKE SURE YOU:   Understand these instructions.  Will watch your condition.  Will get  help right away if you are not doing well or get worse. Document Released: 08/16/2008 Document Revised: 08/28/2013 Document Reviewed: 06/19/2013 [facility] Patient Information 2015 [place], [place]. This information is not intended to replace advice given to you by your health care provider. Make sure you discuss any questions you have with your health care provider.

## 2015-06-12 NOTE — Progress Notes (Signed)
[name], female   DOB: Aug 27, 1991, 24 y.o.   MRN: [id number] Presents with positive pregnancy test at [facility]. 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 LMP first week of June normal cycle, condoms inconsistently. Same partner. Denies need for STD screen. Having some vaginal discharge with irritation. Denies urinary symptoms, abdominal pain or fever. Has follow-up with [facility] 06/17/2015, was instructed to  continue CellCept until then. Unemployed.  Exam: Appears well. Abdomen obese nontender. External genitalia within normal limits, speculum exam moderate amount of a white discharge wet prep positive for amines, clues, TNTC bacteria. Bimanual uterus enlarged 6-8 weeks' size, nontender no adnexal tenderness.  Lung disease on CellCept Early pregnancy Bacteria vaginosis  Plan: MetroGel vaginal cream 1 applicator at bedtime 5, safe pregnancy behaviors reviewed, avoid alcohol, prenatal vitamin daily encouraged. Reviewed CellCept category D,  risk for pregnancy loss reviewed. Schedule viability ultrasound next week prior to follow-up appointment at [facility]. Reviewed we no longer provide pregnancy care will most likely need to establish care at high risk clinic at [facility].

## 2015-06-12 NOTE — Addendum Note (Signed)
Addended by: BARNES, Shivansh Hardaway on: 06/12/2015 02:42 PM   Modules accepted: Orders  

## 2015-06-16 ENCOUNTER — Other Ambulatory Visit: Payer: Self-pay | Admitting: Women's Health

## 2015-06-16 ENCOUNTER — Telehealth: Payer: Self-pay | Admitting: *Deleted

## 2015-06-16 ENCOUNTER — Other Ambulatory Visit: Payer: Self-pay | Admitting: *Deleted

## 2015-06-16 DIAGNOSIS — N760 Acute vaginitis: Secondary | ICD-10-CM

## 2015-06-16 MED ORDER — FLUCONAZOLE 150 MG PO TABS: 150.0000 mg | ORAL_TABLET | Freq: Once | ORAL | Status: DC

## 2015-06-16 MED ORDER — METRONIDAZOLE 500 MG PO TABS: 500.0000 mg | ORAL_TABLET | Freq: Two times a day (BID) | ORAL | Status: DC

## 2015-06-16 NOTE — Telephone Encounter (Signed)
Pt was prescribed metrogel on OV 06/12/15 states it will cost $300 with insurance, asked if she could have flagyl. Please advise

## 2015-06-16 NOTE — Telephone Encounter (Signed)
E scribed Flagyl, left patient message on her cell that medication was sent.

## 2015-06-16 NOTE — Telephone Encounter (Signed)
Received refill request for diflucan x1. Per chart seen in April this year for vagintis and given diflucan. Refill approved per protocol.

## 2015-06-18 ENCOUNTER — Ambulatory Visit (INDEPENDENT_AMBULATORY_CARE_PROVIDER_SITE_OTHER): Payer: No Typology Code available for payment source

## 2015-06-18 ENCOUNTER — Encounter: Payer: Self-pay | Admitting: Women's Health

## 2015-06-18 ENCOUNTER — Ambulatory Visit (INDEPENDENT_AMBULATORY_CARE_PROVIDER_SITE_OTHER): Payer: No Typology Code available for payment source | Admitting: Women's Health

## 2015-06-18 VITALS — BP 140/80 | Ht 63.0 in | Wt 157.0 lb

## 2015-06-18 DIAGNOSIS — N912 Amenorrhea, unspecified: Secondary | ICD-10-CM

## 2015-06-18 DIAGNOSIS — O3680X0 Pregnancy with inconclusive fetal viability, not applicable or unspecified: Secondary | ICD-10-CM

## 2015-06-18 NOTE — Progress Notes (Addendum)
[name], female   DOB: 25-Jun-1991, 24 y.o.   MRN: [id number] Presents for viability-dating ultrasound. Unsure of last cycle thinks her last LMP was first week of June. Currently on prednisone 20 mg daily for lung disease being treated at [facility], questionable lupus, lung problem, facial rash, hair loss. Had been on CellCept only took several doses and stopped. Has follow-up scheduled at [facility].  Exam: Appears well,  happy with pregnancy. Ultrasound: T/V anteverted uterus homogeneous, living IUP seen in fundus. Gestational sac normal-shaped yolk sac seen. Fetal pole seen, Crown rump length 5.3 mm equal 6 weeks, fetal heart motion 130 bpm. Right ovary corpus luteum cyst 17 x 19 x 23 mm, CFD periphery, 2 cyst internal low level echoes, avascular 10 mm 9 mm. Left ovary normal. Negative cul-de-sac. Cervix long and closed.  Early gestation SLE  Plan: Continue prenatal vitamin daily, safe pregnancy behaviors reviewed, will schedule new OB appointment at high risk clinic at [facility]. Instructed to keep appointment with [facility], continue prednisone until. Best to start and stop CellCept, category D.  06/19/2015 telephone call to the [facility], reviewed spoke with Dr. Marchelle Gearing,  Dr. 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 States is unsure of plans, happy with pregnancy and not sure if she would like to proceed with pregnancy or terminate. Will discuss again next week to make plans, did review health issues/concerns related to lupus and pregnancy.

## 2015-06-19 ENCOUNTER — Telehealth: Payer: Self-pay | Admitting: *Deleted

## 2015-06-19 ENCOUNTER — Telehealth: Payer: Self-pay | Admitting: Internal Medicine

## 2015-06-19 NOTE — Telephone Encounter (Signed)
Appointment on 07/06/15 @ 8:45am at [facility] hospital ground floor, left message for pt to call

## 2015-06-19 NOTE — Telephone Encounter (Signed)
Telephone call to [facility] Dr. Jane Canary nurse. Reviewed I did not recommend discontinuing CellCept, patient reported yesterday only took several doses and stopped herself. States was only taking 20 mg of prednisone daily. At office visit week prior patient reported took CellCept and prednisone 20 mg daily.  Telephone call from Dr. Marchelle Gearing. States spoke to Dr. Gaynell Face at [facility] and Dr. Corliss Skains, both recommend termination of pregnancy due to her SLE, lung disease and health risks related to pregnancy.  Telephone call to [facility], reviewed above phone call, states Dr. Gaynell Face at [facility] recommended termination. States she is unsure what she would like to do at this point. He did recommend to restart the CellCept, I did encourage her to take daily and follow their recommendations. Again reviewed with her high-risk pregnancy, and may be best to terminate for her health. Reviewed she is only 6 weeks, early pregnancy and best to terminate early. Reviewed will talk to her next week to help make arrangements for termination or continue and start prenatal care at high-risk clinic in August. (Has appointment scheduled)

## 2015-06-19 NOTE — Telephone Encounter (Signed)
Will route back to MR as requested.

## 2015-06-19 NOTE — Telephone Encounter (Signed)
Kayla [name], ANP ( with copy to my RN Mignon Pine)  [name] was started on cellcept end June/early July by Dr Corliss Skains local rheum due to concern of SLE and getting worse. [facility] and [facility] to [facility]cepb ut I am seeing a note from them 06/12/15 that she is pregnant urine positive. Looks like you have confirmed pregnancy.  I note in your note that you are recommending dc cellcept. She has life threatening or atleast progressive lung disease with potential for worsening. In fact she was worsening with high dose pred.  [facility] notes also attest that disease can get worse with pergnancy - possibly death. I believe [facility] Dr Gaynell Face was recommending termination of pregnancy. THis is reading his phone note. I would support that conclusion.  Please call me [phone] and ask for my RN Robynn Pane who can give you my cell number so we can disc uss. I am on vacation but can addres this anytime  Thanks  Dr. Kalman Shan, M.D., [facility].C.P Pulmonary and Critical Care Medicine Staff Physician [facility] Pager: [phone], If no answer or between  15:00h - 7:00h: call [phone]  06/19/2015 1:39 PM    PS : Robynn Pane if no message from GYN on this by Monday 06/22/15 - call them. I am sending a phone note to Dr Corliss Skains who I have texted to call me

## 2015-06-19 NOTE — Telephone Encounter (Signed)
Kayla [name] - she told methat Dr Gaynell Face of [facility] started cellcept. Got email from Dr Gaynell Face today and also per Dr Corliss Skains - termination of pregnancy highly recommended. Life threatening if pregnancy continued. [name] (d/w her too) will call and communicate this wiath patient who apparently wants to continue pregnancy

## 2015-06-19 NOTE — Telephone Encounter (Signed)
-----   Message from Harrington Challenger, NP sent at 06/18/2015  4:54 PM EDT ----- Needs an appointment with high risk OB clinic at [facility]. Lungs problem on prednisone questionable lupus. She is unemployed any time okay.

## 2015-06-30 ENCOUNTER — Telehealth: Payer: Self-pay | Admitting: Women's Health

## 2015-06-30 NOTE — Telephone Encounter (Signed)
Message left

## 2015-07-06 ENCOUNTER — Encounter: Payer: Self-pay | Admitting: Family Medicine

## 2015-07-06 ENCOUNTER — Ambulatory Visit (INDEPENDENT_AMBULATORY_CARE_PROVIDER_SITE_OTHER): Payer: No Typology Code available for payment source | Admitting: Family Medicine

## 2015-07-06 VITALS — BP 139/79 | HR 86 | Temp 98.6°F | Wt 159.3 lb

## 2015-07-06 DIAGNOSIS — J9611 Chronic respiratory failure with hypoxia: Secondary | ICD-10-CM

## 2015-07-06 DIAGNOSIS — O0991 Supervision of high risk pregnancy, unspecified, first trimester: Secondary | ICD-10-CM

## 2015-07-06 DIAGNOSIS — M329 Systemic lupus erythematosus, unspecified: Secondary | ICD-10-CM

## 2015-07-06 DIAGNOSIS — O9989 Other specified diseases and conditions complicating pregnancy, childbirth and the puerperium: Secondary | ICD-10-CM

## 2015-07-06 DIAGNOSIS — O26899 Other specified pregnancy related conditions, unspecified trimester: Secondary | ICD-10-CM

## 2015-07-06 DIAGNOSIS — O0990 Supervision of high risk pregnancy, unspecified, unspecified trimester: Secondary | ICD-10-CM | POA: Insufficient documentation

## 2015-07-06 DIAGNOSIS — O99891 Other specified diseases and conditions complicating pregnancy: Secondary | ICD-10-CM

## 2015-07-06 LAB — POCT URINALYSIS DIP (DEVICE)
Bilirubin Urine: NEGATIVE
GLUCOSE, UA: NEGATIVE mg/dL
Ketones, ur: NEGATIVE mg/dL
LEUKOCYTES UA: NEGATIVE
NITRITE: NEGATIVE
PROTEIN: NEGATIVE mg/dL
SPECIFIC GRAVITY, URINE: 1.02 (ref 1.005–1.030)
UROBILINOGEN UA: 0.2 mg/dL (ref 0.0–1.0)
pH: 6 (ref 5.0–8.0)

## 2015-07-06 NOTE — Patient Instructions (Signed)
First Trimester of Pregnancy The first trimester of pregnancy is from week 1 until the end of week 12 (months 1 through 3). A week after a sperm fertilizes an egg, the egg will implant on the wall of the uterus. This embryo will begin to develop into a baby. Genes from you and your partner are forming the baby. The female genes determine whether the baby is a boy or a girl. At 6-8 weeks, the eyes and face are formed, and the heartbeat can be seen on ultrasound. At the end of 12 weeks, all the baby's organs are formed.  Now that you are pregnant, you will want to do everything you can to have a healthy baby. Two of the most important things are to get good prenatal care and to follow your health care provider's instructions. Prenatal care is all the medical care you receive before the baby's birth. This care will help prevent, find, and treat any problems during the pregnancy and childbirth. BODY CHANGES Your body goes through many changes during pregnancy. The changes vary from woman to woman.   You may gain or lose a couple of pounds at first.  You may feel sick to your stomach (nauseous) and throw up (vomit). If the vomiting is uncontrollable, call your health care provider.  You may tire easily.  You may develop headaches that can be relieved by medicines approved by your health care provider.  You may urinate more often. Painful urination may mean you have a bladder infection.  You may develop heartburn as a result of your pregnancy.  You may develop constipation because certain hormones are causing the muscles that push waste through your intestines to slow down.  You may develop hemorrhoids or swollen, bulging veins (varicose veins).  Your breasts may begin to grow larger and become tender. Your nipples may stick out more, and the tissue that surrounds them (areola) may become darker.  Your gums may bleed and may be sensitive to brushing and flossing.  Dark spots or blotches  (chloasma, mask of pregnancy) may develop on your face. This will likely fade after the baby is born.  Your menstrual periods will stop.  You may have a loss of appetite.  You may develop cravings for certain kinds of food.  You may have changes in your emotions from day to day, such as being excited to be pregnant or being concerned that something may go wrong with the pregnancy and baby.  You may have more vivid and strange dreams.  You may have changes in your hair. These can include thickening of your hair, rapid growth, and changes in texture. Some women also have hair loss during or after pregnancy, or hair that feels dry or thin. Your hair will most likely return to normal after your baby is born. WHAT TO EXPECT AT YOUR PRENATAL VISITS During a routine prenatal visit:  You will be weighed to make sure you and the baby are growing normally.  Your blood pressure will be taken.  Your abdomen will be measured to track your baby's growth.  The fetal heartbeat will be listened to starting around week 10 or 12 of your pregnancy.  Test results from any previous visits will be discussed. Your health care provider may ask you:  How you are feeling.  If you are feeling the baby move.  If you have had any abnormal symptoms, such as leaking fluid, bleeding, severe headaches, or abdominal cramping.  If you have any questions. Other tests   that may be performed during your first trimester include:  Blood tests to find your blood type and to check for the presence of any previous infections. They will also be used to check for low iron levels (anemia) and Rh antibodies. Later in the pregnancy, blood tests for diabetes will be done along with other tests if problems develop.  Urine tests to check for infections, diabetes, or protein in the urine.  An ultrasound to confirm the proper growth and development of the baby.  An amniocentesis to check for possible genetic problems.  Fetal  screens for spina bifida and Down syndrome.  You may need other tests to make sure you and the baby are doing well. HOME CARE INSTRUCTIONS  Medicines  Follow your health care provider's instructions regarding medicine use. Specific medicines may be either safe or unsafe to take during pregnancy.  Take your prenatal vitamins as directed.  If you develop constipation, try taking a stool softener if your health care provider approves. Diet  Eat regular, well-balanced meals. Choose a variety of foods, such as meat or vegetable-based protein, fish, milk and low-fat dairy products, vegetables, fruits, and whole grain breads and cereals. Your health care provider will help you determine the amount of weight gain that is right for you.  Avoid raw meat and uncooked cheese. These carry germs that can cause birth defects in the baby.  Eating four or five small meals rather than three large meals a day may help relieve nausea and vomiting. If you start to feel nauseous, eating a few soda crackers can be helpful. Drinking liquids between meals instead of during meals also seems to help nausea and vomiting.  If you develop constipation, eat more high-fiber foods, such as fresh vegetables or fruit and whole grains. Drink enough fluids to keep your urine clear or pale yellow. Activity and Exercise  Exercise only as directed by your health care provider. Exercising will help you:  Control your weight.  Stay in shape.  Be prepared for labor and delivery.  Experiencing pain or cramping in the lower abdomen or low back is a good sign that you should stop exercising. Check with your health care provider before continuing normal exercises.  Try to avoid standing for long periods of time. Move your legs often if you must stand in one place for a long time.  Avoid heavy lifting.  Wear low-heeled shoes, and practice good posture.  You may continue to have sex unless your health care provider directs you  otherwise. Relief of Pain or Discomfort  Wear a good support bra for breast tenderness.   Take warm sitz baths to soothe any pain or discomfort caused by hemorrhoids. Use hemorrhoid cream if your health care provider approves.   Rest with your legs elevated if you have leg cramps or low back pain.  If you develop varicose veins in your legs, wear support hose. Elevate your feet for 15 minutes, 3-4 times a day. Limit salt in your diet. Prenatal Care  Schedule your prenatal visits by the twelfth week of pregnancy. They are usually scheduled monthly at first, then more often in the last 2 months before delivery.  Write down your questions. Take them to your prenatal visits.  Keep all your prenatal visits as directed by your health care provider. Safety  Wear your seat belt at all times when driving.  Make a list of emergency phone numbers, including numbers for family, friends, the hospital, and police and fire departments. General Tips    Ask your health care provider for a referral to a local prenatal education class. Begin classes no later than at the beginning of month 6 of your pregnancy.  Ask for help if you have counseling or nutritional needs during pregnancy. Your health care provider can offer advice or refer you to specialists for help with various needs.  Do not use hot tubs, steam rooms, or saunas.  Do not douche or use tampons or scented sanitary pads.  Do not cross your legs for long periods of time.  Avoid cat litter boxes and soil used by cats. These carry germs that can cause birth defects in the baby and possibly loss of the fetus by miscarriage or stillbirth.  Avoid all smoking, herbs, alcohol, and medicines not prescribed by your health care provider. Chemicals in these affect the formation and growth of the baby.  Schedule a dentist appointment. At home, brush your teeth with a soft toothbrush and be gentle when you floss. SEEK MEDICAL CARE IF:   You have  dizziness.  You have mild pelvic cramps, pelvic pressure, or nagging pain in the abdominal area.  You have persistent nausea, vomiting, or diarrhea.  You have a bad smelling vaginal discharge.  You have pain with urination.  You notice increased swelling in your face, hands, legs, or ankles. SEEK IMMEDIATE MEDICAL CARE IF:   You have a fever.  You are leaking fluid from your vagina.  You have spotting or bleeding from your vagina.  You have severe abdominal cramping or pain.  You have rapid weight gain or loss.  You vomit blood or material that looks like coffee grounds.  You are exposed to German measles and have never had them.  You are exposed to fifth disease or chickenpox.  You develop a severe headache.  You have shortness of breath.  You have any kind of trauma, such as from a fall or a car accident. Document Released: 11/01/2001 Document Revised: 03/24/2014 Document Reviewed: 09/17/2013 [employer] Patient Information 2015 [employer]. This information is not intended to replace advice given to you by your health care provider. Make sure you discuss any questions you have with your health care provider.  Breastfeeding Deciding to breastfeed is one of the best choices you can make for you and your baby. A change in hormones during pregnancy causes your breast tissue to grow and increases the number and size of your milk ducts. These hormones also allow proteins, sugars, and fats from your blood supply to make breast milk in your milk-producing glands. Hormones prevent breast milk from being released before your baby is born as well as prompt milk flow after birth. Once breastfeeding has begun, thoughts of your baby, as well as his or her sucking or crying, can stimulate the release of milk from your milk-producing glands.  BENEFITS OF BREASTFEEDING For Your Baby  Your first milk (colostrum) helps your baby's digestive system function better.   There are antibodies  in your milk that help your baby fight off infections.   Your baby has a lower incidence of asthma, allergies, and sudden infant death syndrome.   The nutrients in breast milk are better for your baby than infant formulas and are designed uniquely for your baby's needs.   Breast milk improves your baby's brain development.   Your baby is less likely to develop other conditions, such as childhood obesity, asthma, or type 2 diabetes mellitus.  For You   Breastfeeding helps to create a very special bond between   you and your baby.   Breastfeeding is convenient. Breast milk is always available at the correct temperature and costs nothing.   Breastfeeding helps to burn calories and helps you lose the weight gained during pregnancy.   Breastfeeding makes your uterus contract to its prepregnancy size faster and slows bleeding (lochia) after you give birth.   Breastfeeding helps to lower your risk of developing type 2 diabetes mellitus, osteoporosis, and breast or ovarian cancer later in life. SIGNS THAT YOUR BABY IS HUNGRY Early Signs of Hunger  Increased alertness or activity.  Stretching.  Movement of the head from side to side.  Movement of the head and opening of the mouth when the corner of the mouth or cheek is stroked (rooting).  Increased sucking sounds, smacking lips, cooing, sighing, or squeaking.  Hand-to-mouth movements.  Increased sucking of fingers or hands. Late Signs of Hunger  Fussing.  Intermittent crying. Extreme Signs of Hunger Signs of extreme hunger will require calming and consoling before your baby will be able to breastfeed successfully. Do not wait for the following signs of extreme hunger to occur before you initiate breastfeeding:   Restlessness.  A loud, strong cry.   Screaming. BREASTFEEDING BASICS Breastfeeding Initiation  Find a comfortable place to sit or lie down, with your neck and back well supported.  Place a pillow or  rolled up blanket under your baby to bring him or her to the level of your breast (if you are seated). Nursing pillows are specially designed to help support your arms and your baby while you breastfeed.  Make sure that your baby's abdomen is facing your abdomen.   Gently massage your breast. With your fingertips, massage from your chest wall toward your nipple in a circular motion. This encourages milk flow. You may need to continue this action during the feeding if your milk flows slowly.  Support your breast with 4 fingers underneath and your thumb above your nipple. Make sure your fingers are well away from your nipple and your baby's mouth.   Stroke your baby's lips gently with your finger or nipple.   When your baby's mouth is open wide enough, quickly bring your baby to your breast, placing your entire nipple and as much of the colored area around your nipple (areola) as possible into your baby's mouth.   More areola should be visible above your baby's upper lip than below the lower lip.   Your baby's tongue should be between his or her lower gum and your breast.   Ensure that your baby's mouth is correctly positioned around your nipple (latched). Your baby's lips should create a seal on your breast and be turned out (everted).  It is common for your baby to suck about 2-3 minutes in order to start the flow of breast milk. Latching Teaching your baby how to latch on to your breast properly is very important. An improper latch can cause nipple pain and decreased milk supply for you and poor weight gain in your baby. Also, if your baby is not latched onto your nipple properly, he or she may swallow some air during feeding. This can make your baby fussy. Burping your baby when you switch breasts during the feeding can help to get rid of the air. However, teaching your baby to latch on properly is still the best way to prevent fussiness from swallowing air while breastfeeding. Signs  that your baby has successfully latched on to your nipple:    Silent tugging or silent   sucking, without causing you pain.   Swallowing heard between every 3-4 sucks.    Muscle movement above and in front of his or her ears while sucking.  Signs that your baby has not successfully latched on to nipple:   Sucking sounds or smacking sounds from your baby while breastfeeding.  Nipple pain. If you think your baby has not latched on correctly, slip your finger into the corner of your baby's mouth to break the suction and place it between your baby's gums. Attempt breastfeeding initiation again. Signs of Successful Breastfeeding Signs from your baby:   A gradual decrease in the number of sucks or complete cessation of sucking.   Falling asleep.   Relaxation of his or her body.   Retention of a small amount of milk in his or her mouth.   Letting go of your breast by himself or herself. Signs from you:  Breasts that have increased in firmness, weight, and size 1-3 hours after feeding.   Breasts that are softer immediately after breastfeeding.  Increased milk volume, as well as a change in milk consistency and color by the fifth day of breastfeeding.   Nipples that are not sore, cracked, or bleeding. Signs That Your Baby is Getting Enough Milk  Wetting at least 3 diapers in a 24-hour period. The urine should be clear and pale yellow by age 5 days.  At least 3 stools in a 24-hour period by age 5 days. The stool should be soft and yellow.  At least 3 stools in a 24-hour period by age 7 days. The stool should be seedy and yellow.  No loss of weight greater than 10% of birth weight during the first 3 days of age.  Average weight gain of 4-7 ounces (113-198 g) per week after age 4 days.  Consistent daily weight gain by age 5 days, without weight loss after the age of 2 weeks. After a feeding, your baby may spit up a small amount. This is common. BREASTFEEDING FREQUENCY AND  DURATION Frequent feeding will help you make more milk and can prevent sore nipples and breast engorgement. Breastfeed when you feel the need to reduce the fullness of your breasts or when your baby shows signs of hunger. This is called "breastfeeding on demand." Avoid introducing a pacifier to your baby while you are working to establish breastfeeding (the first 4-6 weeks after your baby is born). After this time you may choose to use a pacifier. Research has shown that pacifier use during the first year of a baby's life decreases the risk of sudden infant death syndrome (SIDS). Allow your baby to feed on each breast as long as he or she wants. Breastfeed until your baby is finished feeding. When your baby unlatches or falls asleep while feeding from the first breast, offer the second breast. Because newborns are often sleepy in the first few weeks of life, you may need to awaken your baby to get him or her to feed. Breastfeeding times will vary from baby to baby. However, the following rules can serve as a guide to help you ensure that your baby is properly fed:  Newborns (babies 4 weeks of age or younger) may breastfeed every 1-3 hours.  Newborns should not go longer than 3 hours during the day or 5 hours during the night without breastfeeding.  You should breastfeed your baby a minimum of 8 times in a 24-hour period until you begin to introduce solid foods to your baby at around 6   months of age. BREAST MILK PUMPING Pumping and storing breast milk allows you to ensure that your baby is exclusively fed your breast milk, even at times when you are unable to breastfeed. This is especially important if you are going back to work while you are still breastfeeding or when you are not able to be present during feedings. Your lactation consultant can give you guidelines on how long it is safe to store breast milk.  A breast pump is a machine that allows you to pump milk from your breast into a sterile bottle.  The pumped breast milk can then be stored in a refrigerator or freezer. Some breast pumps are operated by hand, while others use electricity. Ask your lactation consultant which type will work best for you. Breast pumps can be purchased, but some hospitals and breastfeeding support groups lease breast pumps on a monthly basis. A lactation consultant can teach you how to hand express breast milk, if you prefer not to use a pump.  CARING FOR YOUR BREASTS WHILE YOU BREASTFEED Nipples can become dry, cracked, and sore while breastfeeding. The following recommendations can help keep your breasts moisturized and healthy:  Avoid using soap on your nipples.   Wear a supportive bra. Although not required, special nursing bras and tank tops are designed to allow access to your breasts for breastfeeding without taking off your entire bra or top. Avoid wearing underwire-style bras or extremely tight bras.  Air dry your nipples for 3-4minutes after each feeding.   Use only cotton bra pads to absorb leaked breast milk. Leaking of breast milk between feedings is normal.   Use lanolin on your nipples after breastfeeding. Lanolin helps to maintain your skin's normal moisture barrier. If you use pure lanolin, you do not need to wash it off before feeding your baby again. Pure lanolin is not toxic to your baby. You may also hand express a few drops of breast milk and gently massage that milk into your nipples and allow the milk to air dry. In the first few weeks after giving birth, some women experience extremely full breasts (engorgement). Engorgement can make your breasts feel heavy, warm, and tender to the touch. Engorgement peaks within 3-5 days after you give birth. The following recommendations can help ease engorgement:  Completely empty your breasts while breastfeeding or pumping. You may want to start by applying warm, moist heat (in the shower or with warm water-soaked hand towels) just before feeding or  pumping. This increases circulation and helps the milk flow. If your baby does not completely empty your breasts while breastfeeding, pump any extra milk after he or she is finished.  Wear a snug bra (nursing or regular) or tank top for 1-2 days to signal your body to slightly decrease milk production.  Apply ice packs to your breasts, unless this is too uncomfortable for you.  Make sure that your baby is latched on and positioned properly while breastfeeding. If engorgement persists after 48 hours of following these recommendations, contact your health care provider or a lactation consultant. OVERALL HEALTH CARE RECOMMENDATIONS WHILE BREASTFEEDING  Eat healthy foods. Alternate between meals and snacks, eating 3 of each per day. Because what you eat affects your breast milk, some of the foods may make your baby more irritable than usual. Avoid eating these foods if you are sure that they are negatively affecting your baby.  Drink milk, fruit juice, and water to satisfy your thirst (about 10 glasses a day).   Rest   often, relax, and continue to take your prenatal vitamins to prevent fatigue, stress, and anemia.  Continue breast self-awareness checks.  Avoid chewing and smoking tobacco.  Avoid alcohol and drug use. Some medicines that may be harmful to your baby can pass through breast milk. It is important to ask your health care provider before taking any medicine, including all over-the-counter and prescription medicine as well as vitamin and herbal supplements. It is possible to become pregnant while breastfeeding. If birth control is desired, ask your health care provider about options that will be safe for your baby. SEEK MEDICAL CARE IF:   You feel like you want to stop breastfeeding or have become frustrated with breastfeeding.  You have painful breasts or nipples.  Your nipples are cracked or bleeding.  Your breasts are red, tender, or warm.  You have a swollen area on either  breast.  You have a fever or chills.  You have nausea or vomiting.  You have drainage other than breast milk from your nipples.  Your breasts do not become full before feedings by the fifth day after you give birth.  You feel sad and depressed.  Your baby is too sleepy to eat well.  Your baby is having trouble sleeping.   Your baby is wetting less than 3 diapers in a 24-hour period.  Your baby has less than 3 stools in a 24-hour period.  Your baby's skin or the white part of his or her eyes becomes yellow.   Your baby is not gaining weight by 5 days of age. SEEK IMMEDIATE MEDICAL CARE IF:   Your baby is overly tired (lethargic) and does not want to wake up and feed.  Your baby develops an unexplained fever. Document Released: 11/07/2005 Document Revised: 11/12/2013 Document Reviewed: 05/01/2013 [employer] Patient Information 2015 [employer]. This information is not intended to replace advice given to you by your health care provider. Make sure you discuss any questions you have with your health care provider.  

## 2015-07-06 NOTE — Telephone Encounter (Signed)
Pt checked in for her appointment for the below.

## 2015-07-06 NOTE — Progress Notes (Signed)
MFM consult scheduled for 07/07/2015 @ 08:00AM.

## 2015-07-06 NOTE — Progress Notes (Signed)
   Subjective:    Kayla [name] is a G1P0000 [redacted]w[redacted]d being seen today for her first obstetrical visit.  Her obstetrical history is significant for SLE, new onset with significant lung involvement, including oxygen requirement.  On Prednisone, has stopped her Plaquenil and CellCept.  Followed by [facility], to see them tomorrow.  They have recommending pregnancy termination.  I have reviewed with the patient this recommendation, the threat to her own life and her own life expectancy.  She is not interested in Termination of Pregnancy at this time.  Pregnancy history fully reviewed.  Patient reports fatigue and shortness of breath.  Filed Vitals:   07/06/15 0845  BP: 139/79  Pulse: 86  Temp: 98.6 F (37 C)  Weight: 159 lb 4.8 oz (72.258 kg)    HISTORY: OB History  Gravida Para Term Preterm AB SAB TAB Ectopic Multiple Living     # Outcome Date GA Lbr Len/2nd Weight Sex Delivery Anes PTL Lv  1 Current              Past Medical History  Diagnosis Date  . CAP (community acquired pneumonia) 01/07/2015  . GERD (gastroesophageal reflux disease)   . Daily headache     "sometimes" (01/08/2015)  . Arthritis     "hands and legs" (01/08/2015)  . Lupus   . Lung disease    Past Surgical History  Procedure Laterality Date  . Finger surgery Right 03/2014    "laceration, nerve/artery injury" 2nd digit  . Video bronchoscopy Bilateral 01/12/2015    Procedure: VIDEO BRONCHOSCOPY WITH FLUORO;  Surgeon: Leslye Peer, MD;  Location: [facility] ENDOSCOPY;  Service: Cardiopulmonary;  Laterality: Bilateral;   Family History  Problem Relation Age of Onset  . Diabetes Mother   . Diabetes Maternal Aunt   . Diabetes Maternal Grandmother      Exam     Skin: normal coloration and turgor, no rashes    Neurologic: oriented   Extremities: normal strength, tone, and muscle mass   HEENT extra ocular movement intact and sclera clear, anicteric   Mouth/Teeth mucous membranes moist,  pharynx normal without lesions   Neck supple   Cardiovascular: regular rate and rhythm, no murmurs or gallops   Respiratory:  appears well, vitals normal, no respiratory distress, acyanotic, normal RR, ear and throat exam is normal, neck free of mass or lymphadenopathy, chest clear, no wheezing, crepitations, rhonchi, normal symmetric air entry   Abdomen: soft, non-tender; bowel sounds normal; no masses,  no organomegaly      Assessment/Plan:     Problem List Items Addressed This Visit      High   Supervision of high risk pregnancy, antepartum - Primary   Relevant Orders   Glucose Tolerance, 1 HR (50g) w/o Fasting   Prenatal (OB Panel)   GC/Chlamydia Probe Amp   Culture, OB Urine   Prescript Monitor Profile(19)   AMB referral to maternal fetal medicine     Medium   Systemic lupus erythematosus (SLE) affecting pregnancy, antepartum   Relevant Orders   AMB referral to maternal fetal medicine     Unprioritized   Chronic respiratory failure with hypoxia     To f/u with [facility] and follow their recommendations. We discussed options today.  She is not interested in termination.  Follow up in 4 weeks.    Minda Faas S 07/06/2015

## 2015-07-07 ENCOUNTER — Ambulatory Visit (HOSPITAL_COMMUNITY)
Admission: RE | Admit: 2015-07-07 | Discharge: 2015-07-07 | Disposition: A | Discharge status: Home / Self Care | Payer: No Typology Code available for payment source | Source: Ambulatory Visit | Attending: Family Medicine | Admitting: Family Medicine

## 2015-07-07 ENCOUNTER — Encounter (HOSPITAL_COMMUNITY): Payer: Self-pay

## 2015-07-07 VITALS — BP 121/78 | HR 102 | Wt 161.0 lb

## 2015-07-07 DIAGNOSIS — O26899 Other specified pregnancy related conditions, unspecified trimester: Secondary | ICD-10-CM | POA: Insufficient documentation

## 2015-07-07 DIAGNOSIS — M329 Systemic lupus erythematosus, unspecified: Secondary | ICD-10-CM | POA: Diagnosis not present

## 2015-07-07 DIAGNOSIS — O9989 Other specified diseases and conditions complicating pregnancy, childbirth and the puerperium: Secondary | ICD-10-CM

## 2015-07-07 LAB — PRESCRIPTION MONITORING PROFILE (19 PANEL)
Amphetamine/Meth: NEGATIVE ng/mL
Barbiturate Screen, Urine: NEGATIVE ng/mL
Benzodiazepine Screen, Urine: NEGATIVE ng/mL
Buprenorphine, Urine: NEGATIVE ng/mL
CANNABINOID SCRN UR: NEGATIVE ng/mL
CREATININE, URINE: 200.13 mg/dL (ref >20.0)
Carisoprodol, Urine: NEGATIVE ng/mL
Cocaine Metabolites: NEGATIVE ng/mL
FENTANYL URINE: NEGATIVE ng/mL
MDMA URINE: NEGATIVE ng/mL
MEPERIDINE UR: NEGATIVE ng/mL
METHADONE SCREEN, URINE: NEGATIVE ng/mL
Methaqualone: NEGATIVE ng/mL
Nitrites, Initial: NEGATIVE ug/mL
Opiate Screen, Urine: NEGATIVE ng/mL
Oxycodone Screen, Ur: NEGATIVE ng/mL
PHENCYCLIDINE, UR: NEGATIVE ng/mL
Propoxyphene: NEGATIVE ng/mL
Tapentadol, urine: NEGATIVE ng/mL
Tramadol Scrn, Ur: NEGATIVE ng/mL
ZOLPIDEM, URINE: NEGATIVE ng/mL
pH, Initial: 6.1 pH (ref 4.5–8.9)

## 2015-07-07 LAB — GLUCOSE TOLERANCE, 1 HOUR (50G) W/O FASTING: Glucose, 1 Hour GTT: 96 mg/dL (ref 70–140)

## 2015-07-07 LAB — CULTURE, OB URINE
Colony Count: NO GROWTH
ORGANISM ID, BACTERIA: NO GROWTH

## 2015-07-08 LAB — OBSTETRIC PANEL
ANTIBODY SCREEN: NEGATIVE
Basophils Absolute: 0 10*3/uL (ref 0.0–0.1)
Basophils Relative: 0 % (ref 0–1)
EOS PCT: 0 % (ref 0–5)
Eosinophils Absolute: 0 10*3/uL (ref 0.0–0.7)
HCT: 40.2 % (ref 36.0–46.0)
HEMOGLOBIN: 13.7 g/dL (ref 12.0–15.0)
Hepatitis B Surface Ag: NEGATIVE
LYMPHS ABS: 0.8 10*3/uL (ref 0.7–4.0)
Lymphocytes Relative: 11 % — ABNORMAL LOW (ref 12–46)
MCH: 26.6 pg (ref 26.0–34.0)
MCHC: 34.1 g/dL (ref 30.0–36.0)
MCV: 77.9 fL — ABNORMAL LOW (ref 78.0–100.0)
MONO ABS: 0.6 10*3/uL (ref 0.1–1.0)
MPV: 9.4 fL (ref 8.6–12.4)
Monocytes Relative: 8 % (ref 3–12)
Neutro Abs: 6.2 10*3/uL (ref 1.7–7.7)
Neutrophils Relative %: 81 % — ABNORMAL HIGH (ref 43–77)
PLATELETS: 239 10*3/uL (ref 150–400)
RBC: 5.16 MIL/uL — ABNORMAL HIGH (ref 3.87–5.11)
RDW: 14.5 % (ref 11.5–15.5)
RH TYPE: POSITIVE
Rubella: 12.8 Index — ABNORMAL HIGH (ref <0.90)
WBC: 7.6 10*3/uL (ref 4.0–10.5)

## 2015-07-08 LAB — GC/CHLAMYDIA PROBE AMP
CT PROBE, AMP APTIMA: NEGATIVE
GC PROBE AMP APTIMA: NEGATIVE

## 2015-07-09 ENCOUNTER — Emergency Department (HOSPITAL_COMMUNITY): Payer: No Typology Code available for payment source

## 2015-07-09 ENCOUNTER — Encounter (HOSPITAL_COMMUNITY): Payer: Self-pay | Admitting: Family Medicine

## 2015-07-09 ENCOUNTER — Emergency Department (HOSPITAL_COMMUNITY)
Admission: EM | Admit: 2015-07-09 | Discharge: 2015-07-09 | Disposition: A | Discharge status: Home / Self Care | Payer: No Typology Code available for payment source | Attending: Emergency Medicine | Admitting: Emergency Medicine

## 2015-07-09 DIAGNOSIS — Z8701 Personal history of pneumonia (recurrent): Secondary | ICD-10-CM | POA: Diagnosis not present

## 2015-07-09 DIAGNOSIS — Z8709 Personal history of other diseases of the respiratory system: Secondary | ICD-10-CM | POA: Insufficient documentation

## 2015-07-09 DIAGNOSIS — Z8719 Personal history of other diseases of the digestive system: Secondary | ICD-10-CM | POA: Insufficient documentation

## 2015-07-09 DIAGNOSIS — Z7952 Long term (current) use of systemic steroids: Secondary | ICD-10-CM | POA: Diagnosis not present

## 2015-07-09 DIAGNOSIS — O4690 Antepartum hemorrhage, unspecified, unspecified trimester: Secondary | ICD-10-CM

## 2015-07-09 DIAGNOSIS — Z8739 Personal history of other diseases of the musculoskeletal system and connective tissue: Secondary | ICD-10-CM | POA: Diagnosis not present

## 2015-07-09 DIAGNOSIS — Z3A01 Less than 8 weeks gestation of pregnancy: Secondary | ICD-10-CM | POA: Insufficient documentation

## 2015-07-09 DIAGNOSIS — Z79899 Other long term (current) drug therapy: Secondary | ICD-10-CM | POA: Insufficient documentation

## 2015-07-09 DIAGNOSIS — Z87891 Personal history of nicotine dependence: Secondary | ICD-10-CM | POA: Diagnosis not present

## 2015-07-09 DIAGNOSIS — O039 Complete or unspecified spontaneous abortion without complication: Secondary | ICD-10-CM | POA: Insufficient documentation

## 2015-07-09 DIAGNOSIS — O209 Hemorrhage in early pregnancy, unspecified: Secondary | ICD-10-CM | POA: Diagnosis present

## 2015-07-09 LAB — CBC WITH DIFFERENTIAL/PLATELET
BASOS ABS: 0 10*3/uL (ref 0.0–0.1)
BASOS PCT: 0 % (ref 0–1)
EOS ABS: 0 10*3/uL (ref 0.0–0.7)
Eosinophils Relative: 0 % (ref 0–5)
HEMATOCRIT: 38.7 % (ref 36.0–46.0)
Hemoglobin: 13 g/dL (ref 12.0–15.0)
Lymphocytes Relative: 12 % (ref 12–46)
Lymphs Abs: 0.5 10*3/uL — ABNORMAL LOW (ref 0.7–4.0)
MCH: 26.6 pg (ref 26.0–34.0)
MCHC: 33.6 g/dL (ref 30.0–36.0)
MCV: 79.1 fL (ref 78.0–100.0)
MONO ABS: 0.5 10*3/uL (ref 0.1–1.0)
Monocytes Relative: 11 % (ref 3–12)
NEUTROS ABS: 3.6 10*3/uL (ref 1.7–7.7)
Neutrophils Relative %: 77 % (ref 43–77)
PLATELETS: 215 10*3/uL (ref 150–400)
RBC: 4.89 MIL/uL (ref 3.87–5.11)
RDW: 13.9 % (ref 11.5–15.5)
WBC: 4.7 10*3/uL (ref 4.0–10.5)

## 2015-07-09 LAB — URINALYSIS, ROUTINE W REFLEX MICROSCOPIC
Bilirubin Urine: NEGATIVE
GLUCOSE, UA: NEGATIVE mg/dL
KETONES UR: NEGATIVE mg/dL
LEUKOCYTES UA: NEGATIVE
NITRITE: NEGATIVE
PROTEIN: NEGATIVE mg/dL
Specific Gravity, Urine: 1.01 (ref 1.005–1.030)
Urobilinogen, UA: 0.2 mg/dL (ref 0.0–1.0)
pH: 6.5 (ref 5.0–8.0)

## 2015-07-09 LAB — URINE MICROSCOPIC-ADD ON

## 2015-07-09 LAB — COMPREHENSIVE METABOLIC PANEL
ALBUMIN: 3.6 g/dL (ref 3.5–5.0)
ALT: 40 U/L (ref 14–54)
ANION GAP: 11 (ref 5–15)
AST: 44 U/L — AB (ref 15–41)
Alkaline Phosphatase: 46 U/L (ref 38–126)
BILIRUBIN TOTAL: 0.3 mg/dL (ref 0.3–1.2)
BUN: 7 mg/dL (ref 6–20)
CHLORIDE: 105 mmol/L (ref 101–111)
CO2: 21 mmol/L — AB (ref 22–32)
Calcium: 9.1 mg/dL (ref 8.9–10.3)
Creatinine, Ser: 0.67 mg/dL (ref 0.44–1.00)
GFR calc Af Amer: >60 mL/min (ref >60)
GFR calc non Af Amer: >60 mL/min (ref >60)
GLUCOSE: 135 mg/dL — AB (ref 65–99)
POTASSIUM: 3.8 mmol/L (ref 3.5–5.1)
SODIUM: 137 mmol/L (ref 135–145)
Total Protein: 7.3 g/dL (ref 6.5–8.1)

## 2015-07-09 LAB — HCG, QUANTITATIVE, PREGNANCY: HCG, BETA CHAIN, QUANT, S: 3686 m[IU]/mL — AB (ref <5)

## 2015-07-09 LAB — I-STAT BETA HCG BLOOD, ED (MC, WL, AP ONLY): I-stat hCG, quantitative: >2000 m[IU]/mL — ABNORMAL HIGH (ref <5)

## 2015-07-09 NOTE — ED Notes (Signed)
Pelvic exam performed by Tiffany - PA and Elba Schaber T. - EMT assisted. 

## 2015-07-09 NOTE — Discharge Instructions (Signed)
Incomplete Miscarriage A miscarriage is the sudden loss of an unborn baby (fetus) before the 20th week of pregnancy. In an incomplete miscarriage, parts of the fetus or placenta (afterbirth) remain in the body.  Having a miscarriage can be an emotional experience. Talk with your health care provider about any questions you may have about miscarrying, the grieving process, and your future pregnancy plans. CAUSES   Problems with the fetal chromosomes that make it impossible for the baby to develop normally. Problems with the baby's genes or chromosomes are most often the result of errors that occur by chance as the embryo divides and grows. The problems are not inherited from the parents.  Infection of the cervix or uterus.  Hormone problems.  Problems with the cervix, such as having an incompetent cervix. This is when the tissue in the cervix is not strong enough to hold the pregnancy.  Problems with the uterus, such as an abnormally shaped uterus, uterine fibroids, or congenital abnormalities.  Certain medical conditions.  Smoking, drinking alcohol, or taking illegal drugs.  Trauma. SYMPTOMS   Vaginal bleeding or spotting, with or without cramps or pain.  Pain or cramping in the abdomen or lower back.  Passing fluid, tissue, or blood clots from the vagina. DIAGNOSIS  Your health care provider will perform a physical exam. You may also have an ultrasound to confirm the miscarriage. Blood or urine tests may also be ordered. TREATMENT   Usually, a dilation and curettage (D&C) procedure is performed. During a D&C procedure, the cervix is widened (dilated) and any remaining fetal or placental tissue is gently removed from the uterus.  Antibiotic medicines are prescribed if there is an infection. Other medicines may be given to reduce the size of the uterus (contract) if there is a lot of bleeding.  If you have Rh negative blood and your baby was Rh positive, you will need a Rho (D)  immune globulin shot. This shot will protect any future baby from having Rh blood problems in future pregnancies.  You may be confined to bed rest. This means you should stay in bed and only get up to use the bathroom. HOME CARE INSTRUCTIONS   Rest as directed by your health care provider.  Restrict activity as directed by your health care provider. You may be allowed to continue light activity if curettage was not done but you require further treatment.  Keep track of the number of pads you use each day. Keep track of how soaked (saturated) they are. Record this information.  Do not  use tampons.  Do not douche or have sexual intercourse until approved by your health care provider.  Keep all follow-up appointments for reevaluation and continuing management.  Only take over-the-counter or prescription medicines for pain, fever, or discomfort as directed by your health care provider.  Take antibiotic medicine as directed by your health care provider. Make sure you finish it even if you start to feel better. SEEK IMMEDIATE MEDICAL CARE IF:   You experience severe cramps in your stomach, back, or abdomen.  You have an unexplained temperature (make sure to record these temperatures).  You pass large clots or tissue (save these for your health care provider to inspect).  Your bleeding increases.  You become light-headed, weak, or have fainting episodes. MAKE SURE YOU:   Understand these instructions.  Will watch your condition.  Will get help right away if you are not doing well or get worse. Document Released: 11/07/2005 Document Revised: 03/24/2014 Document Reviewed:   06/06/2013 ExitCare Patient Information 2015 [employer]. This information is not intended to replace advice given to you by your health care provider. Make sure you discuss any questions you have with your health care provider.  

## 2015-07-09 NOTE — ED Notes (Signed)
Patient transported to Ultrasound 

## 2015-07-09 NOTE — ED Notes (Signed)
Patient verbalized understanding of discharge instructions, patient given sign and symptoms to return to ED or MAU

## 2015-07-09 NOTE — ED Notes (Signed)
Pt here for light vaginal bleeding. sts when she wipes. Denies any pain or cramping. sts [redacted] weeks pregnant today.

## 2015-07-09 NOTE — ED Provider Notes (Signed)
CSN: [id number]     Arrival date & time 07/09/15  1745 History  This chart was scribed for Kayla Pel, PA-C, working with Melene Plan, DO by Octavia Heir, ED Scribe. This patient was seen in room TR01C/TR01C and the patient's care was started at 7:42 PM.     Chief Complaint  Patient presents with  . Vaginal Bleeding      The history is provided by the patient. No language interpreter was used.   HPI Comments: Kayla [name] is a 24 y.o. female who is [redacted] weeks pregnant presents to the Emergency Department complaining of sudden onset, gradual worsening light vaginal bleeding onset today. Pt notes she found some light vaginal bleeding in her underwear this morning, the size of a silver dollar and reports there was also some blood when she wiped. She also admits she passed a few small clots.   Pt ws recently diagnosed with Lupus and was told to terminate the baby due to the fatality rate with her lupus medications she was supposed  to be taking but she declined. Pt denies vaginal pain and vaginal cramping.  +desonide, plaquenil, Cellcept, prednisone.    ABO Grouping  O       Rh Type  POS       Antibody Screen NEGATIVE  NEG            Past Medical History  Diagnosis Date  . CAP (community acquired pneumonia) 01/07/2015  . GERD (gastroesophageal reflux disease)   . Daily headache     "sometimes" (01/08/2015)  . Arthritis     "hands and legs" (01/08/2015)  . Lupus   . Lung disease    Past Surgical History  Procedure Laterality Date  . Finger surgery Right 03/2014    "laceration, nerve/artery injury" 2nd digit  . Video bronchoscopy Bilateral 01/12/2015    Procedure: VIDEO BRONCHOSCOPY WITH FLUORO;  Surgeon: Leslye Peer, MD;  Location: [facility] ENDOSCOPY;  Service: Cardiopulmonary;  Laterality: Bilateral;   Family History  Problem Relation Age of Onset  . Diabetes Mother   . Diabetes Maternal Aunt   . Diabetes Maternal Grandmother    Social History  Substance Use Topics  . Smoking  status: Former Smoker -- 0.10 packs/day for 5 years    Types: Cigarettes    Quit date: 11/20/2014  . Smokeless tobacco: Never Used  . Alcohol Use: No     Comment: 01/08/2015 "last drink was New Year's Eve"   OB History    Gravida Para Term Preterm AB TAB SAB Ectopic Multiple Living   1 0 0 0 0 0 0 0 0 0      Review of Systems  Genitourinary: Positive for vaginal bleeding. Negative for vaginal pain.    Allergies  Zithromax  Home Medications   Prior to Admission medications   Medication Sig Start Date End Date Taking? Authorizing Provider  Albuterol Sulfate (PROAIR RESPICLICK) 108 (90 BASE) MCG/ACT AEPB Inhale 2 puffs into the lungs every 6 (six) hours as needed. Patient not taking: Reported on 07/06/2015 04/03/15   Ambrose Finland, NP  desonide (DESOWEN) 0.05 % ointment  06/23/15   Historical Provider, MD  fluocinonide ointment (LIDEX) 0.05 %  06/23/15   Historical Provider, MD  hydroxychloroquine (PLAQUENIL) 200 MG tablet Take 1 tablet by mouth daily.    Historical Provider, MD  mycophenolate (CELLCEPT) 500 MG tablet Take 2 tablets by mouth daily. 06/10/15 08/11/15  Historical Provider, MD  predniSONE (DELTASONE) 20 MG tablet Take 20 mg by mouth  daily with breakfast.    Historical Provider, MD  Prenatal Vit-Fe Fumarate-FA (MULTIVITAMIN-PRENATAL) 27-0.8 MG TABS tablet Take 1 tablet by mouth daily at 12 noon.    Historical Provider, MD   Triage vitals: BP 134/81 mmHg  Pulse 103  Temp(Src) 98.2 F (36.8 C) (Oral)  Resp 18  SpO2 98%  LMP 04/22/2015 (LMP Unknown) Physical Exam  Constitutional: She appears well-developed and well-nourished. No distress.  HENT:  Head: Normocephalic and atraumatic.  Eyes: Pupils are equal, round, and reactive to light. Right eye exhibits no discharge. Left eye exhibits no discharge.  Neck: Normal range of motion. Neck supple.  Cardiovascular: Normal rate and regular rhythm.   Pulmonary/Chest: Effort normal. No respiratory distress.  Abdominal: Soft.   Genitourinary: Uterus is enlarged. Uterus is not tender. Cervix exhibits discharge. Cervix exhibits no motion tenderness. Right adnexum displays no mass and no tenderness. Left adnexum displays no mass and no tenderness.  Small to moderate amount of blood and tissue within the vaginal vault.  Cervix has small blood clot coming out of it.  NO tenderness Uterus is enlarged.   Neurological: She is alert. Coordination normal.  Skin: Skin is warm and dry. No rash noted. She is not diaphoretic.  Psychiatric: She has a normal mood and affect. Her behavior is normal.  Nursing note and vitals reviewed.   ED Course  Procedures  DIAGNOSTIC STUDIES: Oxygen Saturation is 98% on RA, normal by my interpretation.  COORDINATION OF CARE:  7:45 PM Discussed treatment plan which includes pelvic exam with pt at bedside and pt agreed to plan.  Labs Review Labs Reviewed  HCG, QUANTITATIVE, PREGNANCY - Abnormal; Notable for the following:    hCG, Beta Chain, Quant, S 3686 (*)    All other components within normal limits  CBC WITH DIFFERENTIAL/PLATELET - Abnormal; Notable for the following:    Lymphs Abs 0.5 (*)    All other components within normal limits  COMPREHENSIVE METABOLIC PANEL - Abnormal; Notable for the following:    CO2 21 (*)    Glucose, Bld 135 (*)    AST 44 (*)    All other components within normal limits  I-STAT BETA HCG BLOOD, ED ([facility], [facility], [facility] ONLY) - Abnormal; Notable for the following:    I-stat hCG, quantitative >2000.0 (*)    All other components within normal limits  WET PREP, GENITAL  URINALYSIS, ROUTINE W REFLEX MICROSCOPIC (NOT AT [facility])  GC/CHLAMYDIA PROBE AMP ([place]) NOT AT [facility]    Imaging Review No results found. I have personally reviewed and evaluated these images and lab results as part of my medical decision-making.   EKG Interpretation None      MDM   Final diagnoses:  Vaginal bleeding in pregnancy   [facility]  regarding patient case. Patient has had fetal demise and incomplete miscarriage, low hemoglobin.   She recommends allowing patient to pass the fetus on her own. If she doesn't pass it by tomorrow morning to check in to the MAU at 8 am to be seen . I discussed results and plan with the patient and her follow-up appointment - [facility] has bereavement services that they will have set up for the patient if she wishes. The patient understands and will not be spending the night alone, she has support at home./ denies having any questions or wanting me to talk to anyone for her.  Medications - No data to display  24 y.o.Wille Glaser Dimaria's evaluation in the Emergency Department  is complete. It has been determined that no acute conditions requiring further emergency intervention are present at this time. The patient/guardian have been advised of the diagnosis and plan. We have discussed signs and symptoms that warrant return to the ED, such as changes or worsening in symptoms.  Vital signs are stable at discharge. Filed Vitals:   07/09/15 2205  BP: 122/95  Pulse: 96  Temp:   Resp: 20    Patient/guardian has voiced understanding and agreed to follow-up with the PCP or specialist.  I personally performed the services described in this documentation, which was scribed in my presence. The recorded information has been reviewed and is accurate.     Kayla Pel, PA-C 07/09/15 2243  Melene Plan, DO 07/09/15 [phone]

## 2015-07-10 ENCOUNTER — Ambulatory Visit (HOSPITAL_COMMUNITY): Admission: RE | Admit: 2015-07-10 | Payer: No Typology Code available for payment source | Source: Ambulatory Visit

## 2015-07-10 LAB — GC/CHLAMYDIA PROBE AMP (~~LOC~~) NOT AT ARMC
Chlamydia: NEGATIVE
Neisseria Gonorrhea: NEGATIVE

## 2015-07-13 ENCOUNTER — Inpatient Hospital Stay (HOSPITAL_COMMUNITY): Payer: No Typology Code available for payment source | Admitting: Anesthesiology

## 2015-07-13 ENCOUNTER — Encounter (HOSPITAL_COMMUNITY): Payer: Self-pay | Admitting: *Deleted

## 2015-07-13 ENCOUNTER — Ambulatory Visit (HOSPITAL_COMMUNITY)
Admission: AD | Admit: 2015-07-13 | Discharge: 2015-07-13 | Disposition: A | Discharge status: Home / Self Care | Payer: No Typology Code available for payment source | Source: Ambulatory Visit | Attending: Family Medicine | Admitting: Family Medicine

## 2015-07-13 ENCOUNTER — Encounter (HOSPITAL_COMMUNITY)
Admission: AD | Disposition: A | Discharge status: Home / Self Care | Payer: Self-pay | Source: Ambulatory Visit | Attending: Family Medicine

## 2015-07-13 DIAGNOSIS — Z3A09 9 weeks gestation of pregnancy: Secondary | ICD-10-CM | POA: Diagnosis not present

## 2015-07-13 DIAGNOSIS — K219 Gastro-esophageal reflux disease without esophagitis: Secondary | ICD-10-CM | POA: Diagnosis not present

## 2015-07-13 DIAGNOSIS — Z87891 Personal history of nicotine dependence: Secondary | ICD-10-CM | POA: Diagnosis not present

## 2015-07-13 DIAGNOSIS — O021 Missed abortion: Secondary | ICD-10-CM | POA: Diagnosis not present

## 2015-07-13 HISTORY — PX: DILATION AND EVACUATION: SHX1459

## 2015-07-13 LAB — CBC
HCT: 37.7 % (ref 36.0–46.0)
Hemoglobin: 12.7 g/dL (ref 12.0–15.0)
MCH: 26.8 pg (ref 26.0–34.0)
MCHC: 33.7 g/dL (ref 30.0–36.0)
MCV: 79.5 fL (ref 78.0–100.0)
PLATELETS: 184 10*3/uL (ref 150–400)
RBC: 4.74 MIL/uL (ref 3.87–5.11)
RDW: 13.9 % (ref 11.5–15.5)
WBC: 6.8 10*3/uL (ref 4.0–10.5)

## 2015-07-13 LAB — ABO/RH: ABO/RH(D): O POS

## 2015-07-13 LAB — TYPE AND SCREEN
ABO/RH(D): O POS
ANTIBODY SCREEN: NEGATIVE

## 2015-07-13 SURGERY — DILATION AND EVACUATION, UTERUS
Anesthesia: Monitor Anesthesia Care | Site: Vagina

## 2015-07-13 MED ORDER — LIDOCAINE HCL (CARDIAC) 20 MG/ML IV SOLN
INTRAVENOUS | Status: AC
  Filled 2015-07-13: qty 5

## 2015-07-13 MED ORDER — LACTATED RINGERS IV SOLN
INTRAVENOUS | Status: DC
  Administered 2015-07-13: 15:00:00 via INTRAVENOUS

## 2015-07-13 MED ORDER — ONDANSETRON HCL 4 MG/2ML IJ SOLN
INTRAMUSCULAR | Status: DC | PRN
  Administered 2015-07-13: 4 mg via INTRAVENOUS

## 2015-07-13 MED ORDER — ONDANSETRON HCL 4 MG/2ML IJ SOLN: 4.0000 mg | Freq: Once | INTRAMUSCULAR | Status: DC | PRN

## 2015-07-13 MED ORDER — FENTANYL CITRATE (PF) 100 MCG/2ML IJ SOLN
INTRAMUSCULAR | Status: AC
  Filled 2015-07-13: qty 2

## 2015-07-13 MED ORDER — OXYCODONE-ACETAMINOPHEN 5-325 MG PO TABS
ORAL_TABLET | ORAL | Status: AC
  Filled 2015-07-13: qty 1

## 2015-07-13 MED ORDER — FENTANYL CITRATE (PF) 100 MCG/2ML IJ SOLN
25.0000 ug | INTRAMUSCULAR | Status: DC | PRN
  Administered 2015-07-13: 50 ug via INTRAVENOUS

## 2015-07-13 MED ORDER — HYDROMORPHONE HCL 1 MG/ML IJ SOLN
2.0000 mg | Freq: Once | INTRAMUSCULAR | Status: AC
  Administered 2015-07-13: 2 mg via INTRAVENOUS
  Filled 2015-07-13: qty 2

## 2015-07-13 MED ORDER — PROPOFOL INFUSION 10 MG/ML OPTIME
INTRAVENOUS | Status: DC | PRN
  Administered 2015-07-13: 120 ug/kg/min via INTRAVENOUS

## 2015-07-13 MED ORDER — OXYCODONE-ACETAMINOPHEN 5-325 MG PO TABS
1.0000 | ORAL_TABLET | Freq: Once | ORAL | Status: AC
  Administered 2015-07-13: 1 via ORAL

## 2015-07-13 MED ORDER — FENTANYL CITRATE (PF) 100 MCG/2ML IJ SOLN
INTRAMUSCULAR | Status: AC
  Filled 2015-07-13: qty 4

## 2015-07-13 MED ORDER — OXYCODONE-ACETAMINOPHEN 5-325 MG PO TABS: 1.0000 | ORAL_TABLET | Freq: Four times a day (QID) | ORAL | Status: DC | PRN

## 2015-07-13 MED ORDER — PROMETHAZINE HCL 25 MG/ML IJ SOLN
INTRAMUSCULAR | Status: AC
  Administered 2015-07-13: 6.25 mg via INTRAVENOUS
  Filled 2015-07-13: qty 1

## 2015-07-13 MED ORDER — LACTATED RINGERS IV SOLN
INTRAVENOUS | Status: DC
  Administered 2015-07-13 (×2): via INTRAVENOUS

## 2015-07-13 MED ORDER — MIDAZOLAM HCL 2 MG/2ML IJ SOLN
INTRAMUSCULAR | Status: AC
  Filled 2015-07-13: qty 4

## 2015-07-13 MED ORDER — BUPIVACAINE-EPINEPHRINE 0.25% -1:200000 IJ SOLN
INTRAMUSCULAR | Status: DC | PRN
  Administered 2015-07-13: 10 mL

## 2015-07-13 MED ORDER — ONDANSETRON HCL 4 MG/2ML IJ SOLN
INTRAMUSCULAR | Status: AC
  Filled 2015-07-13: qty 2

## 2015-07-13 MED ORDER — PROMETHAZINE HCL 25 MG/ML IJ SOLN
6.2500 mg | Freq: Once | INTRAMUSCULAR | Status: AC
  Administered 2015-07-13: 6.25 mg via INTRAVENOUS

## 2015-07-13 MED ORDER — MIDAZOLAM HCL 2 MG/2ML IJ SOLN
INTRAMUSCULAR | Status: DC | PRN
  Administered 2015-07-13: 2 mg via INTRAVENOUS

## 2015-07-13 MED ORDER — OXYCODONE-ACETAMINOPHEN 5-325 MG PO TABS
2.0000 | ORAL_TABLET | Freq: Once | ORAL | Status: DC
  Filled 2015-07-13: qty 2

## 2015-07-13 MED ORDER — KETOROLAC TROMETHAMINE 60 MG/2ML IM SOLN
60.0000 mg | Freq: Once | INTRAMUSCULAR | Status: AC
  Administered 2015-07-13: 60 mg via INTRAMUSCULAR
  Filled 2015-07-13: qty 2

## 2015-07-13 MED ORDER — DEXAMETHASONE SODIUM PHOSPHATE 10 MG/ML IJ SOLN
INTRAMUSCULAR | Status: DC | PRN
  Administered 2015-07-13: 4 mg via INTRAVENOUS

## 2015-07-13 MED ORDER — DOXYCYCLINE HYCLATE 100 MG IV SOLR
100.0000 mg | Freq: Once | INTRAVENOUS | Status: AC
  Administered 2015-07-13: 100 mg via INTRAVENOUS
  Filled 2015-07-13: qty 100

## 2015-07-13 MED ORDER — HYDROMORPHONE HCL 1 MG/ML IJ SOLN
1.0000 mg | Freq: Once | INTRAMUSCULAR | Status: AC
  Administered 2015-07-13: 1 mg via INTRAVENOUS
  Filled 2015-07-13: qty 1

## 2015-07-13 MED ORDER — PROPOFOL 10 MG/ML IV BOLUS
INTRAVENOUS | Status: AC
  Filled 2015-07-13: qty 20

## 2015-07-13 MED ORDER — FENTANYL CITRATE (PF) 100 MCG/2ML IJ SOLN
INTRAMUSCULAR | Status: DC | PRN
  Administered 2015-07-13: 50 ug via INTRAVENOUS

## 2015-07-13 SURGICAL SUPPLY — 18 items
CATH ROBINSON RED A/P 16FR (CATHETERS) ×3 IMPLANT
CLOTH BEACON ORANGE TIMEOUT ST (SAFETY) ×3 IMPLANT
DECANTER SPIKE VIAL GLASS SM (MISCELLANEOUS) ×3 IMPLANT
GLOVE BIOGEL PI IND STRL 7.5 (GLOVE) ×1 IMPLANT
GLOVE BIOGEL PI INDICATOR 7.5 (GLOVE) ×2
GLOVE ECLIPSE 7.5 STRL STRAW (GLOVE) ×6 IMPLANT
GOWN STRL REUS W/TWL LRG LVL3 (GOWN DISPOSABLE) ×9 IMPLANT
KIT BERKELEY 1ST TRIMESTER 3/8 (MISCELLANEOUS) ×3 IMPLANT
NS IRRIG 1000ML POUR BTL (IV SOLUTION) ×3 IMPLANT
PACK VAGINAL MINOR WOMEN LF (CUSTOM PROCEDURE TRAY) ×3 IMPLANT
PAD OB MATERNITY 4.3X12.25 (PERSONAL CARE ITEMS) ×3 IMPLANT
PAD PREP 24X48 CUFFED NSTRL (MISCELLANEOUS) ×3 IMPLANT
SET BERKELEY SUCTION TUBING (SUCTIONS) ×3 IMPLANT
TOWEL OR 17X24 6PK STRL BLUE (TOWEL DISPOSABLE) ×6 IMPLANT
VACURETTE 10 RIGID CVD (CANNULA) IMPLANT
VACURETTE 7MM CVD STRL WRAP (CANNULA) IMPLANT
VACURETTE 8 RIGID CVD (CANNULA) IMPLANT
VACURETTE 9 RIGID CVD (CANNULA) ×3 IMPLANT

## 2015-07-13 NOTE — Transfer of Care (Signed)
Immediate Anesthesia Transfer of Care Note  Patient: Kayla [name]  Procedure(s) Performed: Procedure(s): DILATATION AND EVACUATION (N/A)  Patient Location: PACU  Anesthesia Type:MAC  Level of Consciousness: sedated  Airway & Oxygen Therapy: Patient Spontanous Breathing and Patient connected to nasal cannula oxygen  Post-op Assessment: Report given to RN  Post vital signs: Reviewed and stable  Last Vitals:  Filed Vitals:   07/13/15 1702  BP: 122/77  Pulse: 93  Temp:   Resp: 18    Complications: No apparent anesthesia complications

## 2015-07-13 NOTE — MAU Provider Note (Signed)
History     CSN: [id number]  Arrival date and time: 07/13/15 1049   First Provider Initiated Contact with Patient 07/13/15 1159      Chief Complaint  Patient presents with  . Abdominal Pain  . Vaginal Bleeding   HPI    [name] is 24 y.o. is a 24 y.o. female G1P0000 with a past medical history of lupus and lung disease. She presents today with abdominal pain and vaginal bleedig. She was seen at [facility] days ago and had an [place] that showed a [redacted]w[redacted]d demise; no cardiac activity.   She is here with lower abdominal pain that she rates 9/10; the pain is described as sharp and squeezing. She was instructed to come to MAU if the fetus did not pass on its own.   She has not taken anything for pain.   OB History    Gravida Para Term Preterm AB TAB SAB Ectopic Multiple Living   1 0 0 0 0 0 0 0 0 0       Past Medical History  Diagnosis Date  . CAP (community acquired pneumonia) 01/07/2015  . GERD (gastroesophageal reflux disease)   . Daily headache     "sometimes" (01/08/2015)  . Arthritis     "hands and legs" (01/08/2015)  . Lupus   . Lung disease     Past Surgical History  Procedure Laterality Date  . Finger surgery Right 03/2014    "laceration, nerve/artery injury" 2nd digit  . Video bronchoscopy Bilateral 01/12/2015    Procedure: VIDEO BRONCHOSCOPY WITH FLUORO;  Surgeon: Leslye Peer, MD;  Location: [facility] ENDOSCOPY;  Service: Cardiopulmonary;  Laterality: Bilateral;    Family History  Problem Relation Age of Onset  . Diabetes Mother   . Diabetes Maternal Aunt   . Diabetes Maternal Grandmother     Social History  Substance Use Topics  . Smoking status: Former Smoker -- 0.10 packs/day for 5 years    Types: Cigarettes    Quit date: 11/20/2014  . Smokeless tobacco: Never Used  . Alcohol Use: No     Comment: 01/08/2015 "last drink was New Year's Eve"    Allergies:  Allergies  Allergen Reactions  . Zithromax [Azithromycin] Cough    Prescriptions prior to  admission  Medication Sig Dispense Refill Last Dose  . Albuterol Sulfate (PROAIR RESPICLICK) 108 (90 BASE) MCG/ACT AEPB Inhale 2 puffs into the lungs every 6 (six) hours as needed. (Patient not taking: Reported on 07/06/2015) 1 each 3 Not Taking  . desonide (DESOWEN) 0.05 % ointment    Not Taking  . fluocinonide ointment (LIDEX) 0.05 %    Not Taking  . hydroxychloroquine (PLAQUENIL) 200 MG tablet Take 1 tablet by mouth daily.   Not Taking  . mycophenolate (CELLCEPT) 500 MG tablet Take 2 tablets by mouth daily.   Not Taking  . predniSONE (DELTASONE) 20 MG tablet Take 20 mg by mouth daily with breakfast.   Taking  . Prenatal Vit-Fe Fumarate-FA (MULTIVITAMIN-PRENATAL) 27-0.8 MG TABS tablet Take 1 tablet by mouth daily at 12 noon.   Taking   Results for orders placed or performed during the hospital encounter of 07/13/15 (from the past 48 hour(s))  CBC     Status: None   Collection Time: 07/13/15  1:29 PM  Result Value Ref Range   WBC 6.8 4.0 - 10.5 K/uL   RBC 4.74 3.87 - 5.11 MIL/uL   Hemoglobin 12.7 12.0 - 15.0 g/dL   HCT 40.9 81.1 - 91.4 %  MCV 79.5 78.0 - 100.0 fL   MCH 26.8 26.0 - 34.0 pg   MCHC 33.7 30.0 - 36.0 g/dL   RDW 16.1 09.6 - 04.5 %   Platelets 184 150 - 400 K/uL     US Ob Comp Add'l Gest Less 14 Wks  07/09/2015   CLINICAL DATA:  Acute onset of vaginal bleeding.  Initial encounter.  EXAM: OBSTETRIC <14 WK [place] AND TRANSVAGINAL OB US  TECHNIQUE: Both transabdominal and transvaginal ultrasound examinations were performed for complete evaluation of the gestation as well as the maternal uterus, adnexal regions, and pelvic cul-de-sac. Transvaginal technique was performed to assess early pregnancy.  COMPARISON:  Pelvic ultrasound performed 06/18/2015  FINDINGS: Intrauterine gestational sac: Visualized/normal in shape.  Yolk sac:  No  Embryo:  Yes  Cardiac Activity: No  Heart Rate: N/A  bpm  CRL:  10.7  mm   7 w   1 d  Maternal uterus/adnexae: No subchorionic hemorrhage is noted. The  uterus is unremarkable in appearance.  The ovaries are within normal limits. The right ovary measures 3.2 x 3.1 x 1.7 cm, while the left ovary measures 2.6 x 1.0 x 2.3 cm. No suspicious adnexal masses are seen. There is no evidence for ovarian torsion.  No free fluid is seen within the pelvic cul-de-sac.  IMPRESSION: Single intrauterine gestational sac noted, with an embryo measuring 1.1 cm. No cardiac activity visualized, compatible with definitive fetal demise.   Electronically Signed   By: Roanna Raider M.D.   On: 07/09/2015 21:59   US Ob Transvaginal  07/09/2015   CLINICAL DATA:  Acute onset of vaginal bleeding.  Initial encounter.  EXAM: OBSTETRIC <14 WK [place] AND TRANSVAGINAL OB US  TECHNIQUE: Both transabdominal and transvaginal ultrasound examinations were performed for complete evaluation of the gestation as well as the maternal uterus, adnexal regions, and pelvic cul-de-sac. Transvaginal technique was performed to assess early pregnancy.  COMPARISON:  Pelvic ultrasound performed 06/18/2015  FINDINGS: Intrauterine gestational sac: Visualized/normal in shape.  Yolk sac:  No  Embryo:  Yes  Cardiac Activity: No  Heart Rate: N/A  bpm  CRL:  10.7  mm   7 w   1 d  Maternal uterus/adnexae: No subchorionic hemorrhage is noted. The uterus is unremarkable in appearance.  The ovaries are within normal limits. The right ovary measures 3.2 x 3.1 x 1.7 cm, while the left ovary measures 2.6 x 1.0 x 2.3 cm. No suspicious adnexal masses are seen. There is no evidence for ovarian torsion.  No free fluid is seen within the pelvic cul-de-sac.  IMPRESSION: Single intrauterine gestational sac noted, with an embryo measuring 1.1 cm. No cardiac activity visualized, compatible with definitive fetal demise.   Electronically Signed   By: Roanna Raider M.D.   On: 07/09/2015 21:59     Review of Systems  Constitutional: Negative for fever and chills.  Gastrointestinal: Positive for nausea and abdominal pain. Negative for  vomiting.   Physical Exam   Blood pressure 131/91, pulse 106, temperature 98.6 F (37 C), temperature source Oral, resp. rate 16, height 5\' 3"  (1.6 m), last menstrual period 04/22/2015.  Physical Exam  Constitutional: She is oriented to person, place, and time. She appears well-developed and well-nourished. No distress.  GI: Soft.  Genitourinary:  Speculum exam: Vagina - Small-Moderate amount of dark red blood pooling in the vagina. 2 fox swabs saturated.  Cervix - + active bleeding from Os Bimanual exam: Cervix closed Chaperone present for exam.  Musculoskeletal: Normal range of  motion.  Neurological: She is alert and oriented to person, place, and time.  Skin: Skin is warm. She is not diaphoretic. No pallor.    MAU Course  Procedures  None  MDM  O positive blood type Toradol 60 mg IM; minimal relief< percocet 2 tabs.   Discussed US findings with the patient and offered watchful waiting, cytotec, or D&E. The patient would like to have a D&E scheduled.  Discussed findings and patient's wishes with Dr. Adrian Blackwater who will arrange for surgery today   LR  Dilaudid for pain.    Assessment and Plan   A:  1. Missed abortion    P:  Dilation and evacuation today per Dr. Christene Lye I Kayla Skyles, NP 07/13/2015 1:05 PM

## 2015-07-13 NOTE — Op Note (Signed)
Wille Glaser Mesick PROCEDURE DATE: 07/13/2015  PREOPERATIVE DIAGNOSIS: [redacted]w[redacted]d week missed abortion. POSTOPERATIVE DIAGNOSIS: The same. PROCEDURE:     Dilation and Evacuation. SURGEON:  Dr. Candelaria Celeste  INDICATIONS: 24 y.o. G1P0000with MAB at [redacted]w[redacted]d gestation, needing surgical completion.  Risks of surgery were discussed with the patient including but not limited to: bleeding which may require transfusion; infection which may require antibiotics; injury to uterus or surrounding organs;need for additional procedures including laparotomy or laparoscopy; possibility of intrauterine scarring which may impair future fertility; and other postoperative/anesthesia complications. Written informed consent was obtained.    FINDINGS:  A 9w size anteverted uterus, moderate amounts of products of conception, specimen sent to pathology.  ANESTHESIA:    Monitored intravenous sedation, paracervical block. INTRAVENOUS FLUIDS:  500 ml of LR ESTIMATED BLOOD LOSS:  Less than 20 ml. SPECIMENS:  Products of conception sent to pathology COMPLICATIONS:  None immediate.  PROCEDURE DETAILS:  The patient received intravenous antibiotics while in the preoperative area.  She was then taken to the operating room where general anesthesia was administered and was found to be adequate.  After an adequate timeout was performed, she was placed in the dorsal lithotomy position and examined; then prepped and draped in the sterile manner.   Her bladder was catheterized for an unmeasured amount of clear, yellow urine. A vaginal speculum was then placed in the patient's vagina and a paracervical block using 1% Marcaine was administered.  A single tooth tenaculum was then applied to the anterior lip of the cervix. The cervix was gently dilated to accommodate a 9 mm suction curette that was gently advanced to the uterine fundus.  The suction device was then activated and curette slowly rotated to clear the uterus of products of conception.  A sharp  curettage was then performed to confirm completer emptying of the uterus.There was minimal bleeding noted and the tenaculum removed with good hemostasis noted.  The patient tolerated the procedure well.  The patient was taken to the recovery area in stable condition.   Levie Heritage, DO 07/13/2015 5:51 PM

## 2015-07-13 NOTE — Plan of Care (Signed)
Pt. Urine in lab 

## 2015-07-13 NOTE — Discharge Instructions (Signed)
D&E (Dilation and Evacuation) Dilation and evacuation (D&E) is a minor operation. It involves stretching (dilation) the cervix and evacuation of the uterus. During the procedure, the cervix is dilated and tissue is gently suctioned from the inside of the uterus.  REASONS FOR DOING D&E Removal of retained placenta after giving birth.  Abortion. Miscarriage.  RISKS AND COMPLICATIONS Putting a hole (perforation) in the uterus. Excessive bleeding after the D&E.  Infection of the uterus.  Damage to the cervix.  Developing scar tissue (adhesions) inside the uterus, later causing abnormal bleeding or no monthly bleeding (amenorrhea) or problems with fertility. Complications from general or local anesthetic.     PROCEDURE You may be given a drug to make you sleep (general anesthetic) or a drug that numbs the area (local anesthetic) in and around the cervix.  You will lie on your back with your legs in stirrups.  A curved tool (suction curette) will be used to evacuate the uterus and will then be removed.  This usually takes around 15 to 30 minutes.  AFTER THE PROCEDURE You will rest in the recovery room until you are stable and feel ready to go home.  You may feel sick to your stomach (nauseous) or throw up (vomit) if you had general anesthesia.  You may have light cramping and bleeding for a couple days to 2 weeks after the procedure.  Your uterus needs to make new lining after a D&E. This may make your next period late.   HOME CARE INSTRUCTIONS Do not drive for 24 hours.  Wait 1 week before returning to strenuous activities.  You may resume your usual diet.  Drink enough water and fluids to keep your urine clear or pale yellow.  You should return to your usual bowel function. If constipation occurs, you may:  Take a mild laxative with permission from your caregiver.  Add fruit and bran to your diet.  Take showers instead of baths for two weeks Do not go swimming or use a hot tub until  your caregiver gives you permission.  Have someone with you or available for you the first 24 to 48 hours, especially if you had a general anesthetic.  Do not douche, use tampons, or have intercourse until after your follow-up appointment, or when your caregiver approves.  Only take over-the-counter or prescription medicines for pain, discomfort, or fever as directed by your caregiver. Do not take aspirin. It can cause bleeding.  If a prescription has been given to you, follow your caregiver's directions. You may be given a medicine that kills germs (antibiotic) to prevent an infection.  Keep all your follow-up appointments recommended by your caregiver.   SEEK MEDICAL CARE IF: You have increasing cramps or pain not relieved with medicine.  You develop belly (abdominal) pain, which does not seem to be related to the same area as your earlier cramping and pain.  You feel dizzy or feel like fainting.  You have a bad smelling vaginal discharge.  You develop a rash.  You develop a reaction or allergy to your medicine.   SEEK IMMEDIATE MEDICAL CARE IF: Bleeding is heavier than a normal menstrual period.  You have an oral temperature above 101F, not controlled by medicine.  You develop chest pain.  You develop shortness of breath.  You pass out.  You develop heavy vaginal bleeding with or without blood clots.   MAKE SURE YOU: Understand these instructions.  Will watch your condition.  Will get help right away if you are   not doing well or get worse.   UPDATED HEALTH PRACTICES A Pap smear is done to screen for cervical cancer.  The first Pap smear should be done at age 21.  Between ages 21 and 29, Pap smears are repeated every 2 years.  Beginning at age 30, you are advised to have a Pap smear every 3 years as long as your past 3 Pap smears have been normal.  Some women have medical problems that increase the chance of getting cervical cancer. Talk to your caregiver about these problems. It  is especially important to talk to your caregiver if a new problem develops soon after your last Pap smear. In these cases, your caregiver may recommend more frequent screening and Pap smears.  The above recommendations are the same for women who have or have not gotten the vaccine for HPV (human papillomavirus).  If you had a uterus removal (hysterectomy) for a problem that was not a cancer or a condition that could lead to cancer, then you no longer need Pap smears.  If you are between ages 65 and 70, and you have had normal Pap smears going back 10 years, you no longer need Pap smears.  If you have had past treatment for cervical cancer or a condition that could lead to cancer, you need Pap smears and screening for cancer for at least 20 years after your treatment.  Continue monthly breast self-examinations. Your caregiver can provide information and instructions for breast self-examination.  ExitCare Patient Information 2011 [employer].  

## 2015-07-13 NOTE — Anesthesia Preprocedure Evaluation (Addendum)
Anesthesia Evaluation  Patient identified by MRN, date of birth, ID band Patient awake    Reviewed: Allergy & Precautions, NPO status , Patient's Chart, lab work & pertinent test results  History of Anesthesia Complications Negative for: history of anesthetic complications  Airway Mallampati: II  TM Distance: >3 FB Neck ROM: Full    Dental no notable dental hx. 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 On corticosteroid therapy but Cellcept discontinued due to pregnancy. Consideration for initiating Azathioprine in conjunction with [facility]. 2. 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 3. Pneumomediastinum, evident on chest CT May 2016. Likely secondary to severe cough. No evidence of progression. 4. Hypoxic respiratory failure, requirement of 4L supplemental oxygen with peak walk effort. 5. High risk pregnancy in light of above. Significant concern for worsening maternal morbidity and death and fetal harm.  breath sounds clear to auscultation  Pulmonary exam normal       Cardiovascular negative cardio ROS Normal cardiovascular examRhythm:Regular Rate:Normal     Neuro/Psych  Headaches, negative psych ROS   GI/Hepatic negative GI ROS, Neg liver ROS, GERD-  Medicated and Controlled,  Endo/Other  negative endocrine ROS  Renal/GU negative Renal ROS  negative genitourinary   Musculoskeletal  (+) Arthritis -,   Abdominal   Peds negative pediatric ROS (+)  Hematology negative hematology ROS (+)   Anesthesia Other Findings   Reproductive/Obstetrics (+) Pregnancy (9 weeks missed ab)                            Anesthesia Physical Anesthesia Plan  ASA: III and  emergent  Anesthesia Plan: MAC   Post-op Pain Management:    Induction:   Airway Management Planned:   Additional Equipment:   Intra-op Plan:   Post-operative Plan:   Informed Consent: I have reviewed the patients History and Physical, chart, labs and discussed the procedure including the risks, benefits and alternatives for the proposed anesthesia with the patient or authorized representative who has indicated his/her understanding and acceptance.   Dental advisory given  Plan Discussed with: CRNA  Anesthesia Plan Comments:        Anesthesia Quick Evaluation

## 2015-07-13 NOTE — H&P (Signed)
Preoperative History and Physical  Kayla [name] is a 24 y.o. G1P0000 here for surgical management of missed abortion.  She was seen at [facility] ED on 8/18, which showed a fetus without a heart rate.  She has been cramping and having some bleeding and came to the MAU for care.  No significant preoperative concerns.  Proposed surgery: Dilation and evacuation  Past Medical History  Diagnosis Date  . CAP (community acquired pneumonia) 01/07/2015  . GERD (gastroesophageal reflux disease)   . Daily headache     "sometimes" (01/08/2015)  . Arthritis     "hands and legs" (01/08/2015)  . Lupus   . Lung disease    Past Surgical History  Procedure Laterality Date  . Finger surgery Right 03/2014    "laceration, nerve/artery injury" 2nd digit  . Video bronchoscopy Bilateral 01/12/2015    Procedure: VIDEO BRONCHOSCOPY WITH FLUORO;  Surgeon: Collene Gobble, MD;  Location: [place];  Service: Cardiopulmonary;  Laterality: Bilateral;   OB History    Gravida Para Term Preterm AB TAB SAB Ectopic Multiple Living   '1 0 0 0 0 0 0 0 0 0 '     Patient denies any cervical dysplasia or STIs. No current facility-administered medications on file prior to encounter.   Current Outpatient Prescriptions on File Prior to Encounter  Medication Sig Dispense Refill  . desonide (DESOWEN) 0.05 % ointment Apply 1 application topically 2 (two) times daily.     . hydroxychloroquine (PLAQUENIL) 200 MG tablet Take 1 tablet by mouth daily.    . mycophenolate (CELLCEPT) 500 MG tablet Take 2 tablets by mouth daily.    . Prenatal Vit-Fe Fumarate-FA (MULTIVITAMIN-PRENATAL) 27-0.8 MG TABS tablet Take 1 tablet by mouth daily at 12 noon.    . Albuterol Sulfate (PROAIR RESPICLICK) 767 (90 BASE) MCG/ACT AEPB Inhale 2 puffs into the lungs every 6 (six) hours as needed. (Patient not taking: Reported on 07/06/2015) 1 each 3  . fluocinonide ointment (LIDEX) 2.09 % Apply 1 application topically 2 (two) times daily.     . predniSONE  (DELTASONE) 20 MG tablet Take 20 mg by mouth daily with breakfast.     Allergies  Allergen Reactions  . Zithromax [Azithromycin] Cough   Social History:   reports that she quit smoking about 7 months ago. Her smoking use included Cigarettes. She has a .5 pack-year smoking history. She has never used smokeless tobacco. She reports that she does not drink alcohol or use illicit drugs.  Family History  Problem Relation Age of Onset  . Diabetes Mother   . Diabetes Maternal Aunt   . Diabetes Maternal Grandmother     Review of Systems: Full 10 systems review of systems preformed, which were normal other than what was stated in the HPI.  PHYSICAL EXAM: Blood pressure 132/99, pulse 97, temperature 98.6 F (37 C), temperature source Oral, resp. rate 16, height '5\' 3"'  (1.6 m), last menstrual period 04/22/2015. General appearance - alert, well appearing, and in no distress Head - Normocephalic, atraumatic.  Right and left external ears normal. Eyes - EOMI.  Nonicteric.  Normal conjunctiva Neck - supple, no lymphadenopathy.  No tracheal deviation Chest - clear to auscultation, no wheezes, rales or rhonchi, symmetric air entry Heart - normal rate and regular rhythm Abdomen - soft, nontender, nondistended, no masses or organomegaly Pelvic - examination not indicated Extremities - peripheral pulses normal, no pedal edema, no clubbing or cyanosis Skin - Warm to touch. no bruises, rashes, wounds. Neuro - Oriented x3.  Cranial nerves intact. Psych - normal thought process.  Judgement intact.  Labs: Results for orders placed or performed during the hospital encounter of 07/13/15 (from the past 336 hour(s))  CBC   Collection Time: 07/13/15  1:29 PM  Result Value Ref Range   WBC 6.8 4.0 - 10.5 K/uL   RBC 4.74 3.87 - 5.11 MIL/uL   Hemoglobin 12.7 12.0 - 15.0 g/dL   HCT 37.7 36.0 - 46.0 %   MCV 79.5 78.0 - 100.0 fL   MCH 26.8 26.0 - 34.0 pg   MCHC 33.7 30.0 - 36.0 g/dL   RDW 13.9 11.5 - 15.5 %    Platelets 184 150 - 400 K/uL  Results for orders placed or performed during the hospital encounter of 07/09/15 (from the past 336 hour(s))  GC/Chlamydia probe amp ([place])not at [facility]   Collection Time: 07/09/15 12:00 AM  Result Value Ref Range   Chlamydia Negative    Neisseria gonorrhea Negative   hCG, quantitative, pregnancy   Collection Time: 07/09/15  6:24 PM  Result Value Ref Range   hCG, Beta Chain, Quant, S 3686 (H) <5 mIU/mL  CBC with Differential   Collection Time: 07/09/15  6:24 PM  Result Value Ref Range   WBC 4.7 4.0 - 10.5 K/uL   RBC 4.89 3.87 - 5.11 MIL/uL   Hemoglobin 13.0 12.0 - 15.0 g/dL   HCT 38.7 36.0 - 46.0 %   MCV 79.1 78.0 - 100.0 fL   MCH 26.6 26.0 - 34.0 pg   MCHC 33.6 30.0 - 36.0 g/dL   RDW 13.9 11.5 - 15.5 %   Platelets 215 150 - 400 K/uL   Neutrophils Relative % 77 43 - 77 %   Neutro Abs 3.6 1.7 - 7.7 K/uL   Lymphocytes Relative 12 12 - 46 %   Lymphs Abs 0.5 (L) 0.7 - 4.0 K/uL   Monocytes Relative 11 3 - 12 %   Monocytes Absolute 0.5 0.1 - 1.0 K/uL   Eosinophils Relative 0 0 - 5 %   Eosinophils Absolute 0.0 0.0 - 0.7 K/uL   Basophils Relative 0 0 - 1 %   Basophils Absolute 0.0 0.0 - 0.1 K/uL  Comprehensive metabolic panel   Collection Time: 07/09/15  6:24 PM  Result Value Ref Range   Sodium 137 135 - 145 mmol/L   Potassium 3.8 3.5 - 5.1 mmol/L   Chloride 105 101 - 111 mmol/L   CO2 21 (L) 22 - 32 mmol/L   Glucose, Bld 135 (H) 65 - 99 mg/dL   BUN 7 6 - 20 mg/dL   Creatinine, Ser 0.67 0.44 - 1.00 mg/dL   Calcium 9.1 8.9 - 10.3 mg/dL   Total Protein 7.3 6.5 - 8.1 g/dL   Albumin 3.6 3.5 - 5.0 g/dL   AST 44 (H) 15 - 41 U/L   ALT 40 14 - 54 U/L   Alkaline Phosphatase 46 38 - 126 U/L   Total Bilirubin 0.3 0.3 - 1.2 mg/dL   GFR calc non Af Amer >60 >60 mL/min   GFR calc Af Amer >60 >60 mL/min   Anion gap 11 5 - 15  I-Stat beta hCG blood, ED ([facility], [facility], [facility] only)   Collection Time: 07/09/15  6:31 PM  Result Value Ref Range   I-stat hCG,  quantitative >2000.0 (H) <5 mIU/mL   Comment 3          Urinalysis, Routine w reflex microscopic (not at [facility])   Collection Time: 07/09/15  8:05 PM  Result Value Ref Range   Color, Urine YELLOW YELLOW   APPearance CLEAR CLEAR   Specific Gravity, Urine 1.010 1.005 - 1.030   pH 6.5 5.0 - 8.0   Glucose, UA NEGATIVE NEGATIVE mg/dL   Hgb urine dipstick LARGE (A) NEGATIVE   Bilirubin Urine NEGATIVE NEGATIVE   Ketones, ur NEGATIVE NEGATIVE mg/dL   Protein, ur NEGATIVE NEGATIVE mg/dL   Urobilinogen, UA 0.2 0.0 - 1.0 mg/dL   Nitrite NEGATIVE NEGATIVE   Leukocytes, UA NEGATIVE NEGATIVE  Urine microscopic-add on   Collection Time: 07/09/15  8:05 PM  Result Value Ref Range   Squamous Epithelial / LPF FEW (A) RARE   WBC, UA 0-2 <3 WBC/hpf   RBC / HPF 0-2 <3 RBC/hpf   Bacteria, UA RARE RARE  Results for orders placed or performed in visit on 07/06/15 (from the past 336 hour(s))  POCT urinalysis dip (device)   Collection Time: 07/06/15  9:07 AM  Result Value Ref Range   Glucose, UA NEGATIVE NEGATIVE mg/dL   Bilirubin Urine NEGATIVE NEGATIVE   Ketones, ur NEGATIVE NEGATIVE mg/dL   Specific Gravity, Urine 1.020 1.005 - 1.030   Hgb urine dipstick LARGE (A) NEGATIVE   pH 6.0 5.0 - 8.0   Protein, ur NEGATIVE NEGATIVE mg/dL   Urobilinogen, UA 0.2 0.0 - 1.0 mg/dL   Nitrite NEGATIVE NEGATIVE   Leukocytes, UA NEGATIVE NEGATIVE  Glucose Tolerance, 1 HR (50g) w/o Fasting   Collection Time: 07/06/15 10:48 AM  Result Value Ref Range   Glucose, 1 Hour GTT 96 70 - 140 mg/dL  Prenatal (OB Panel)   Collection Time: 07/06/15 10:48 AM  Result Value Ref Range   WBC 7.6 4.0 - 10.5 K/uL   RBC 5.16 (H) 3.87 - 5.11 MIL/uL   Hemoglobin 13.7 12.0 - 15.0 g/dL   HCT 40.2 36.0 - 46.0 %   MCV 77.9 (L) 78.0 - 100.0 fL   MCH 26.6 26.0 - 34.0 pg   MCHC 34.1 30.0 - 36.0 g/dL   RDW 14.5 11.5 - 15.5 %   Platelets 239 150 - 400 K/uL   MPV 9.4 8.6 - 12.4 fL   Neutrophils Relative % 81 (H) 43 - 77 %   Neutro Abs  6.2 1.7 - 7.7 K/uL   Lymphocytes Relative 11 (L) 12 - 46 %   Lymphs Abs 0.8 0.7 - 4.0 K/uL   Monocytes Relative 8 3 - 12 %   Monocytes Absolute 0.6 0.1 - 1.0 K/uL   Eosinophils Relative 0 0 - 5 %   Eosinophils Absolute 0.0 0.0 - 0.7 K/uL   Basophils Relative 0 0 - 1 %   Basophils Absolute 0.0 0.0 - 0.1 K/uL   Smear Review Criteria for review not met    Hepatitis B Surface Ag NEGATIVE NEGATIVE   RPR Ser Ql NON REAC NON REAC   Rubella 12.80 (H) <0.90 Index   ABO Grouping O    Rh Type POS    Antibody Screen NEG NEGATIVE  Prescript Monitor Profile(19)   Collection Time: 07/06/15 10:49 AM  Result Value Ref Range   Creatinine, Urine 200.13 >20.0 mg/dL   pH, Initial 6.1 4.5 - 8.9 pH   Nitrites, Initial NEG Cutoff:200 ug/mL   Amphetamine/Meth NEG Cutoff:500 ng/mL   Barbiturate Screen, Urine NEG Cutoff:200 ng/mL   Benzodiazepine Screen, Urine NEG Cutoff:100 ng/mL   Buprenorphine, Urine NEG Cutoff:10 ng/mL   Cannabinoid Scrn, Ur NEG Cutoff:50 ng/mL   Cocaine Metabolites NEG Cutoff:150 ng/mL  MDMA URINE NEG Cutoff:500 ng/mL   Methadone Screen, Urine NEG Cutoff:300 ng/mL   Oxycodone Screen, Ur NEG Cutoff:100 ng/mL   Tramadol Scrn, Ur NEG Cutoff:200 ng/mL   Propoxyphene NEG Cutoff:300 ng/mL   Tapentadol, urine NEG Cutoff:200 ng/mL   Opiate Screen, Urine NEG Cutoff:100 ng/mL   Zolpidem, Urine NEG Cutoff:20 ng/mL   Methaqualone NEG Cutoff:300 ng/mL   Phencyclidine, Ur NEG Cutoff:25 ng/mL   Fentanyl, Ur NEG Cutoff:2 ng/mL   Meperidine, Ur NEG Cutoff:200 ng/mL   Carisoprodol, Urine NEG Cutoff:100 ng/mL   Prescribed Drug 1 NONE PROVIDED   GC/Chlamydia Probe Amp   Collection Time: 07/06/15 11:02 AM  Result Value Ref Range   CT Probe RNA NEGATIVE    GC Probe RNA NEGATIVE   Culture, OB Urine   Collection Time: 07/06/15 11:02 AM  Result Value Ref Range   Colony Count NO GROWTH    Organism ID, Bacteria NO GROWTH     Imaging Studies: US Ob Comp Add'l Gest Less 14 Wks  07/09/2015    CLINICAL DATA:  Acute onset of vaginal bleeding.  Initial encounter.  EXAM: OBSTETRIC <14 WK [place] AND TRANSVAGINAL OB US  TECHNIQUE: Both transabdominal and transvaginal ultrasound examinations were performed for complete evaluation of the gestation as well as the maternal uterus, adnexal regions, and pelvic cul-de-sac. Transvaginal technique was performed to assess early pregnancy.  COMPARISON:  Pelvic ultrasound performed 06/18/2015  FINDINGS: Intrauterine gestational sac: Visualized/normal in shape.  Yolk sac:  No  Embryo:  Yes  Cardiac Activity: No  Heart Rate: N/A  bpm  CRL:  10.7  mm   7 w   1 d  Maternal uterus/adnexae: No subchorionic hemorrhage is noted. The uterus is unremarkable in appearance.  The ovaries are within normal limits. The right ovary measures 3.2 x 3.1 x 1.7 cm, while the left ovary measures 2.6 x 1.0 x 2.3 cm. No suspicious adnexal masses are seen. There is no evidence for ovarian torsion.  No free fluid is seen within the pelvic cul-de-sac.  IMPRESSION: Single intrauterine gestational sac noted, with an embryo measuring 1.1 cm. No cardiac activity visualized, compatible with definitive fetal demise.   Electronically Signed   By: Garald Balding M.D.   On: 07/09/2015 21:59   US Ob Transvaginal  07/09/2015   CLINICAL DATA:  Acute onset of vaginal bleeding.  Initial encounter.  EXAM: OBSTETRIC <14 WK [place] AND TRANSVAGINAL OB US  TECHNIQUE: Both transabdominal and transvaginal ultrasound examinations were performed for complete evaluation of the gestation as well as the maternal uterus, adnexal regions, and pelvic cul-de-sac. Transvaginal technique was performed to assess early pregnancy.  COMPARISON:  Pelvic ultrasound performed 06/18/2015  FINDINGS: Intrauterine gestational sac: Visualized/normal in shape.  Yolk sac:  No  Embryo:  Yes  Cardiac Activity: No  Heart Rate: N/A  bpm  CRL:  10.7  mm   7 w   1 d  Maternal uterus/adnexae: No subchorionic hemorrhage is noted. The uterus is unremarkable  in appearance.  The ovaries are within normal limits. The right ovary measures 3.2 x 3.1 x 1.7 cm, while the left ovary measures 2.6 x 1.0 x 2.3 cm. No suspicious adnexal masses are seen. There is no evidence for ovarian torsion.  No free fluid is seen within the pelvic cul-de-sac.  IMPRESSION: Single intrauterine gestational sac noted, with an embryo measuring 1.1 cm. No cardiac activity visualized, compatible with definitive fetal demise.   Electronically Signed   By: Garald Balding M.D.   On:  07/09/2015 21:59   US Ob Transvaginal  06/18/2015    T/V anteverted uterus homogeneous, living IUP seen in fundus. Gestational  sac normal-shaped yolk sac seen. Fetal pole seen, Crown rump length 5.3 mm  equal 6 weeks, fetal heart motion 130 bpm. Right ovary corpus luteum cyst  17 x 19 x 23 mm, CFD periphery, 2 cyst internal low level echoes,  avascular 10 mm 9 mm. Left ovary normal. Negative cul-de-sac. Cervix long  and closed   Assessment: Patient Active Problem List   Diagnosis Date Noted  . Supervision of high risk pregnancy, antepartum 07/06/2015  . Systemic lupus erythematosus (SLE) affecting pregnancy, antepartum 07/06/2015  . Chronic respiratory failure with hypoxia 05/21/2015  . Connective tissue disease, undifferentiated 05/08/2015  . Pneumomediastinum 04/14/2015  . ILD (interstitial lung disease) 02/06/2015  . Sjogren's disease 02/06/2015  . Dyspnea 02/06/2015  . Malar rash 01/09/2015  . Alopecia 01/09/2015  . Arthralgia 01/09/2015  . Protein-calorie malnutrition, severe 01/08/2015    Plan: Patient will undergo surgical management with Dilation and evacuation.   The risks of surgery were discussed in detail with the patient including but not limited to: bleeding which may require transfusion or reoperation; infection which may require antibiotics; injury to surrounding organs which may involve bowel, bladder, ureters ; need for additional procedures including laparoscopy or laparotomy;  thromboembolic phenomenon, surgical site problems and other postoperative/anesthesia complications. Likelihood of success in alleviating the patient's condition was discussed. Routine postoperative instructions will be reviewed with the patient and her family in detail after surgery.  The patient concurred with the proposed plan, giving informed written consent for the surgery.  Patient has been NPO since last night she will remain NPO for procedure.  Anesthesia and OR aware.  Preoperative prophylactic antibiotics and SCDs ordered on call to the OR.  To OR when ready.  Truett Mainland, DO  07/13/2015, 2:09 PM

## 2015-07-13 NOTE — MAU Note (Signed)
Pt here for medial lower abdominal pain since last pm. Noted bleeding this am, only when wiping. No abnormal discharge or uti s/s.

## 2015-07-13 NOTE — Anesthesia Postprocedure Evaluation (Signed)
  Anesthesia Post-op Note  Patient: Kayla [name]  Procedure(s) Performed: Procedure(s) (LRB): DILATATION AND EVACUATION (N/A)  Patient Location: PACU    Level of Consciousness: awake and alert   Airway and Oxygen Therapy: Patient Spontanous Breathing  Post-op Pain: mild  Post-op Assessment: Post-op Vital signs reviewed, Patient's Cardiovascular Status Stable, Respiratory Function Stable, Patent Airway and No signs of Nausea or vomiting  Last Vitals:  Filed Vitals:   07/13/15 1900  BP:   Pulse: 76  Temp:   Resp: 18    Post-op Vital Signs: stable   Complications: No apparent anesthesia complications

## 2015-07-14 ENCOUNTER — Telehealth: Payer: Self-pay | Admitting: General Practice

## 2015-07-14 ENCOUNTER — Encounter (HOSPITAL_COMMUNITY): Payer: Self-pay | Admitting: Family Medicine

## 2015-07-14 LAB — RPR: RPR: NONREACTIVE

## 2015-07-14 NOTE — Telephone Encounter (Signed)
Patient called and left message stating she is calling about the D&E procedure she had done. Patient states they prescribed medication but it isn't working and thinks she may need a stronger dose. Per chart review, patient was d/c home yesterday following D&E and given 15 percocet. Called patient stating I am returning her call. Patient states the medication isn't helping her at all and she cannot tell a difference when she takes it. Discussed with patient and soreness following surgery is expected and she shouldn't expect to be pain free. Discussed with patient that she should continue percocet but also take prescription strength ibuprofen in between. Also discussed that surgery can cause significant gas pain as well and recommended OTC gas medication and to get up and move around frequently. Patient verbalized understanding and had no other questions

## 2015-07-20 ENCOUNTER — Ambulatory Visit: Payer: Self-pay | Admitting: Internal Medicine

## 2015-07-23 ENCOUNTER — Encounter: Payer: Self-pay | Admitting: Family Medicine

## 2015-07-23 ENCOUNTER — Other Ambulatory Visit: Payer: Self-pay | Admitting: Family Medicine

## 2015-07-23 ENCOUNTER — Other Ambulatory Visit: Payer: Self-pay | Admitting: Internal Medicine

## 2015-07-28 ENCOUNTER — Other Ambulatory Visit: Payer: Self-pay | Admitting: Family Medicine

## 2015-07-29 ENCOUNTER — Other Ambulatory Visit: Payer: Self-pay | Admitting: Family Medicine

## 2015-07-29 ENCOUNTER — Other Ambulatory Visit: Payer: Self-pay | Admitting: General Practice

## 2015-07-29 NOTE — Telephone Encounter (Signed)
3:47 pm- Patient returned call. Requests return call back.

## 2015-07-29 NOTE — Telephone Encounter (Signed)
Per epic, patient requested refill on percocet. Dr Adrian Blackwater advised no refills as patient shouldn't need percocet this far out from surgery. If pain is severe patient should go to MAU. Per chart review, patient has appt next week on 9/14. Called patient to discuss, no answer- left message stating we are trying to reach you regarding your refill request, please call us back at the clinics

## 2015-07-29 NOTE — Telephone Encounter (Signed)
Opened in error

## 2015-07-30 MED ORDER — DICLOFENAC SODIUM 75 MG PO TBEC: 75.0000 mg | DELAYED_RELEASE_TABLET | Freq: Two times a day (BID) | ORAL | Status: DC

## 2015-07-30 NOTE — Telephone Encounter (Addendum)
Called pt and LM to return call to the Clinics.   9/8  0828  Pt returned our call today @ 0824 and requested call back.  I called her and explained that we had received a message from her pharmacy that she was requesting Percocet refill. She confirmed that was correct. I advised that since her surgery was more than a week ago, she should not be needing a strong narcotic to manage her pain and it cannot be refilled. I offered to send Rx for diclofenac and pt agreed. She requested Rx to be sent to [facility] on [facility] [facility]. Pt was instructed to go to MAU for evaluation if her pain becomes severe and she voiced understanding.  Diane Day RNC

## 2015-08-03 ENCOUNTER — Ambulatory Visit: Payer: No Typology Code available for payment source

## 2015-08-05 ENCOUNTER — Encounter: Payer: Self-pay | Admitting: Family Medicine

## 2015-08-05 ENCOUNTER — Ambulatory Visit (INDEPENDENT_AMBULATORY_CARE_PROVIDER_SITE_OTHER): Payer: No Typology Code available for payment source | Admitting: Family Medicine

## 2015-08-05 VITALS — BP 151/108 | HR 102 | Temp 98.6°F | Ht 63.0 in | Wt 162.4 lb

## 2015-08-05 DIAGNOSIS — R1032 Left lower quadrant pain: Secondary | ICD-10-CM

## 2015-08-05 DIAGNOSIS — O021 Missed abortion: Secondary | ICD-10-CM | POA: Diagnosis not present

## 2015-08-05 NOTE — Progress Notes (Signed)
   Subjective:    [name], female    DOB: [birth year], 24 y.o.   MRN: [id number]  HPI Patient seen for follow up of missed AB.  She had a D&E, which she tolerated.  She denies bleeding, but she has been having constant left lower quadrant abdominal pain, which she rates at 5/10.  No radiation of pain.     Review of Systems  Constitutional: Negative for fever and chills.  Gastrointestinal: Negative for nausea, vomiting, abdominal pain and diarrhea.  Genitourinary: Negative for vaginal pain and pelvic pain.      Objective:   Physical Exam  Constitutional: She appears well-developed and well-nourished.  Cardiovascular: Normal rate, regular rhythm and normal heart sounds.  Exam reveals no gallop and no friction rub.   No murmur heard. Pulmonary/Chest: Effort normal and breath sounds normal. No respiratory distress. She has no wheezes. She has no rales. She exhibits no tenderness.  Abdominal: Bowel sounds are normal. She exhibits no distension and no mass. There is tenderness (LLQ pain). There is no rebound and no guarding.  Skin: Skin is warm and dry. No rash noted. No erythema. No pallor.      Assessment & Plan:  1. Left lower quadrant pain - US Pelvis Complete; Future - US Transvaginal Non-OB; Future  2. Missed abortion Good healing.

## 2015-08-05 NOTE — Progress Notes (Signed)
States has been having a lot of headaches lately- taking tylenol with little relief, but states if she can goes to sleep- that helps her headaches.

## 2015-08-12 ENCOUNTER — Ambulatory Visit (HOSPITAL_COMMUNITY)
Admission: RE | Admit: 2015-08-12 | Discharge: 2015-08-12 | Disposition: A | Discharge status: Home / Self Care | Payer: No Typology Code available for payment source | Source: Ambulatory Visit | Attending: Family Medicine | Admitting: Family Medicine

## 2015-08-12 DIAGNOSIS — R1032 Left lower quadrant pain: Secondary | ICD-10-CM | POA: Diagnosis present

## 2015-08-12 DIAGNOSIS — N8320 Unspecified ovarian cysts: Secondary | ICD-10-CM | POA: Insufficient documentation

## 2015-08-14 ENCOUNTER — Emergency Department (HOSPITAL_COMMUNITY)
Admission: EM | Admit: 2015-08-14 | Discharge: 2015-08-14 | Disposition: A | Discharge status: Home / Self Care | Payer: No Typology Code available for payment source | Attending: Emergency Medicine | Admitting: Emergency Medicine

## 2015-08-14 ENCOUNTER — Emergency Department (HOSPITAL_COMMUNITY): Payer: No Typology Code available for payment source

## 2015-08-14 ENCOUNTER — Encounter (HOSPITAL_COMMUNITY): Payer: Self-pay | Admitting: Vascular Surgery

## 2015-08-14 ENCOUNTER — Telehealth: Payer: Self-pay | Admitting: *Deleted

## 2015-08-14 DIAGNOSIS — L0231 Cutaneous abscess of buttock: Secondary | ICD-10-CM | POA: Insufficient documentation

## 2015-08-14 DIAGNOSIS — Z87891 Personal history of nicotine dependence: Secondary | ICD-10-CM | POA: Diagnosis not present

## 2015-08-14 DIAGNOSIS — R071 Chest pain on breathing: Secondary | ICD-10-CM | POA: Insufficient documentation

## 2015-08-14 DIAGNOSIS — R51 Headache: Secondary | ICD-10-CM | POA: Diagnosis not present

## 2015-08-14 DIAGNOSIS — R0789 Other chest pain: Secondary | ICD-10-CM

## 2015-08-14 DIAGNOSIS — E876 Hypokalemia: Secondary | ICD-10-CM

## 2015-08-14 DIAGNOSIS — I10 Essential (primary) hypertension: Secondary | ICD-10-CM | POA: Diagnosis not present

## 2015-08-14 DIAGNOSIS — L03317 Cellulitis of buttock: Secondary | ICD-10-CM | POA: Diagnosis not present

## 2015-08-14 DIAGNOSIS — G8929 Other chronic pain: Secondary | ICD-10-CM

## 2015-08-14 DIAGNOSIS — M1990 Unspecified osteoarthritis, unspecified site: Secondary | ICD-10-CM | POA: Insufficient documentation

## 2015-08-14 DIAGNOSIS — R112 Nausea with vomiting, unspecified: Secondary | ICD-10-CM | POA: Diagnosis not present

## 2015-08-14 DIAGNOSIS — Z79899 Other long term (current) drug therapy: Secondary | ICD-10-CM | POA: Diagnosis not present

## 2015-08-14 DIAGNOSIS — K219 Gastro-esophageal reflux disease without esophagitis: Secondary | ICD-10-CM | POA: Diagnosis not present

## 2015-08-14 DIAGNOSIS — J159 Unspecified bacterial pneumonia: Secondary | ICD-10-CM | POA: Insufficient documentation

## 2015-08-14 DIAGNOSIS — L0291 Cutaneous abscess, unspecified: Secondary | ICD-10-CM

## 2015-08-14 DIAGNOSIS — L0390 Cellulitis, unspecified: Secondary | ICD-10-CM

## 2015-08-14 DIAGNOSIS — R197 Diarrhea, unspecified: Secondary | ICD-10-CM

## 2015-08-14 DIAGNOSIS — L089 Local infection of the skin and subcutaneous tissue, unspecified: Secondary | ICD-10-CM | POA: Diagnosis present

## 2015-08-14 DIAGNOSIS — N83201 Unspecified ovarian cyst, right side: Secondary | ICD-10-CM

## 2015-08-14 LAB — URINALYSIS, ROUTINE W REFLEX MICROSCOPIC
BILIRUBIN URINE: NEGATIVE
GLUCOSE, UA: NEGATIVE mg/dL
Ketones, ur: NEGATIVE mg/dL
Leukocytes, UA: NEGATIVE
Nitrite: NEGATIVE
PH: 7 (ref 5.0–8.0)
Protein, ur: NEGATIVE mg/dL
SPECIFIC GRAVITY, URINE: 1.005 (ref 1.005–1.030)
UROBILINOGEN UA: 0.2 mg/dL (ref 0.0–1.0)

## 2015-08-14 LAB — BASIC METABOLIC PANEL
ANION GAP: 10 (ref 5–15)
BUN: 8 mg/dL (ref 6–20)
CALCIUM: 9.3 mg/dL (ref 8.9–10.3)
CHLORIDE: 104 mmol/L (ref 101–111)
CO2: 23 mmol/L (ref 22–32)
Creatinine, Ser: 0.7 mg/dL (ref 0.44–1.00)
GFR calc Af Amer: >60 mL/min (ref >60)
GFR calc non Af Amer: >60 mL/min (ref >60)
GLUCOSE: 131 mg/dL — AB (ref 65–99)
Potassium: 3.3 mmol/L — ABNORMAL LOW (ref 3.5–5.1)
Sodium: 137 mmol/L (ref 135–145)

## 2015-08-14 LAB — HEPATIC FUNCTION PANEL
ALBUMIN: 3.6 g/dL (ref 3.5–5.0)
ALK PHOS: 63 U/L (ref 38–126)
ALT: 15 U/L (ref 14–54)
AST: 29 U/L (ref 15–41)
BILIRUBIN TOTAL: 0.4 mg/dL (ref 0.3–1.2)
Total Protein: 7.3 g/dL (ref 6.5–8.1)

## 2015-08-14 LAB — CBC
HCT: 39.1 % (ref 36.0–46.0)
HEMOGLOBIN: 13.1 g/dL (ref 12.0–15.0)
MCH: 27.1 pg (ref 26.0–34.0)
MCHC: 33.5 g/dL (ref 30.0–36.0)
MCV: 80.8 fL (ref 78.0–100.0)
Platelets: 262 10*3/uL (ref 150–400)
RBC: 4.84 MIL/uL (ref 3.87–5.11)
RDW: 13.1 % (ref 11.5–15.5)
WBC: 6.8 10*3/uL (ref 4.0–10.5)

## 2015-08-14 LAB — URINE MICROSCOPIC-ADD ON

## 2015-08-14 LAB — I-STAT TROPONIN, ED: TROPONIN I, POC: 0 ng/mL (ref 0.00–0.08)

## 2015-08-14 MED ORDER — VANCOMYCIN HCL IN DEXTROSE 1-5 GM/200ML-% IV SOLN
1000.0000 mg | Freq: Once | INTRAVENOUS | Status: AC
  Administered 2015-08-14: 1000 mg via INTRAVENOUS
  Filled 2015-08-14: qty 200

## 2015-08-14 MED ORDER — DIPHENHYDRAMINE HCL 50 MG/ML IJ SOLN
25.0000 mg | Freq: Once | INTRAMUSCULAR | Status: AC
  Administered 2015-08-14: 25 mg via INTRAVENOUS
  Filled 2015-08-14: qty 1

## 2015-08-14 MED ORDER — SODIUM CHLORIDE 0.9 % IV BOLUS (SEPSIS)
1000.0000 mL | Freq: Once | INTRAVENOUS | Status: AC
  Administered 2015-08-14: 1000 mL via INTRAVENOUS

## 2015-08-14 MED ORDER — ONDANSETRON HCL 8 MG PO TABS: 8.0000 mg | ORAL_TABLET | Freq: Three times a day (TID) | ORAL | Status: DC | PRN

## 2015-08-14 MED ORDER — CEPHALEXIN 500 MG PO CAPS: ORAL_CAPSULE | ORAL | Status: DC

## 2015-08-14 MED ORDER — NAPROXEN 500 MG PO TABS: 500.0000 mg | ORAL_TABLET | Freq: Two times a day (BID) | ORAL | Status: DC | PRN

## 2015-08-14 MED ORDER — METOCLOPRAMIDE HCL 5 MG/ML IJ SOLN
10.0000 mg | Freq: Once | INTRAMUSCULAR | Status: AC
  Administered 2015-08-14: 10 mg via INTRAVENOUS
  Filled 2015-08-14: qty 2

## 2015-08-14 MED ORDER — SULFAMETHOXAZOLE-TRIMETHOPRIM 800-160 MG PO TABS: 1.0000 | ORAL_TABLET | Freq: Two times a day (BID) | ORAL | Status: DC

## 2015-08-14 MED ORDER — HYDROCODONE-ACETAMINOPHEN 5-325 MG PO TABS: 1.0000 | ORAL_TABLET | Freq: Four times a day (QID) | ORAL | Status: DC | PRN

## 2015-08-14 MED ORDER — KETOROLAC TROMETHAMINE 30 MG/ML IJ SOLN
30.0000 mg | Freq: Once | INTRAMUSCULAR | Status: AC
  Administered 2015-08-14: 30 mg via INTRAVENOUS
  Filled 2015-08-14: qty 1

## 2015-08-14 NOTE — ED Notes (Signed)
Pt reports to the ED for eval of abscess to her buttocks. States it has opened and drained. She is requesting testing for MRSA. Has had recurrent abscess. She also reports right rib cage pain x 2-3 days. Denies any fall or injury. Also having frequent HAs x 1 week as well. Denies any numbness, tingling, or paralysis. Also reports N/V/D everyday x 3 days. Denies any blood in her emesis or stool. Pt A&Ox4, resp e/u, and skin warm and dry. On home O2.

## 2015-08-14 NOTE — Telephone Encounter (Signed)
Contacted patient, results of ultrasound and recommendations by Dr. Adrian Blackwater given.  Ultrasound appointment 09/25/15 @ 1100, instructions given.  Pt verbalizes understanding.

## 2015-08-14 NOTE — ED Provider Notes (Signed)
CSN: [id number]     Arrival date & time 08/14/15  1506 History   First MD Initiated Contact with Patient 08/14/15 1605     Chief Complaint  Patient presents with  . Back Pain  . Recurrent Skin Infections     (Consider location/radiation/quality/duration/timing/severity/associated sxs/prior Treatment) HPI Comments: Kayla [name] is a 24 y.o. female with a PMHx of lupus, "lung disease" on home O2, daily headaches, GERD, and recurrent buttock abscesses, who presents to the ED with multiple complaints. Her primary complaint is right lateral rib pain 2 days. She describes pain is 10/10 constant sharp pain radiating to the right flank, worse with movement, and unrelieved with Tylenol and Naprosyn. Additionally she reports an abscess to her left buttock that opened up last night and has been draining a yellowish material. She reports that this abscesses painful, but the swelling erythema and warmth have all diminished in the area. She is concerned that she may have MRSA. She has had prior abscesses in the past. She also reports several days of intermittent nausea vomiting and diarrhea, stating that she has had 3 episodes of nonbloody nonbilious emesis in the last 24 hours, and 3 episodes of daily looser than normal stools which are nonbloody. These have both been intermittent issues of the last several days. She also reports one week of intermittent headaches which are generalized pressure-like headaches similar to her prior typical headaches. No thunderclap onset or increased severity from typical headaches.  She denies any fevers, chills, chest pain, shortness breath, abdominal pain, hematemesis, hematochezia, melena, obstipation, constipation, dysuria, hematuria, vaginal bleeding or discharge, numbness, tingling, weakness, vision changes, or lightheadedness. She denies any known insect bites or skin injury.  Patient is a 24 y.o. female presenting with abscess. The history is provided by the patient. No  language interpreter was used.  Abscess Location:  Ano-genital Ano-genital abscess location:  L buttock Abscess quality: draining, painful, redness and warmth   Red streaking: no   Duration:  1 day Progression:  Improving Pain details:    Quality:  Sharp   Severity:  Severe   Duration:  2 days   Timing:  Constant   Progression:  Unchanged Chronicity:  New Relieved by:  Nothing Exacerbated by: movement. Ineffective treatments:  NSAIDs Associated symptoms: headaches, nausea and vomiting (3x in last 24hrs, intermittent NBNB)   Associated symptoms: no fever   Risk factors: prior abscess     Past Medical History  Diagnosis Date  . CAP (community acquired pneumonia) 01/07/2015  . GERD (gastroesophageal reflux disease)   . Daily headache     "sometimes" (01/08/2015)  . Arthritis     "hands and legs" (01/08/2015)  . Lupus   . Lung disease    Past Surgical History  Procedure Laterality Date  . Finger surgery Right 03/2014    "laceration, nerve/artery injury" 2nd digit  . Video bronchoscopy Bilateral 01/12/2015    Procedure: VIDEO BRONCHOSCOPY WITH FLUORO;  Surgeon: Leslye Peer, MD;  Location: [facility] ENDOSCOPY;  Service: Cardiopulmonary;  Laterality: Bilateral;  . Dilation and evacuation N/A 07/13/2015    Procedure: DILATATION AND EVACUATION;  Surgeon: Levie Heritage, DO;  Location: [facility] ORS;  Service: Gynecology;  Laterality: N/A;   Family History  Problem Relation Age of Onset  . Diabetes Mother   . Diabetes Maternal Aunt   . Diabetes Maternal Grandmother    Social History  Substance Use Topics  . Smoking status: Former Smoker -- 0.10 packs/day for 5 years    Types: Cigarettes  Quit date: 11/20/2014  . Smokeless tobacco: Never Used  . Alcohol Use: No     Comment: 01/08/2015 "last drink was New Year's Eve"   OB History    Gravida Para Term Preterm AB TAB SAB Ectopic Multiple Living       Review of Systems  Constitutional: Negative for fever and  chills.  Eyes: Negative for visual disturbance.  Respiratory: Negative for shortness of breath.   Cardiovascular: Negative for chest pain.  Gastrointestinal: Positive for nausea, vomiting (3x in last 24hrs, intermittent NBNB) and diarrhea (intermittent, looser than normal). Negative for abdominal pain and constipation.  Genitourinary: Positive for flank pain (R lateral rib pain radiating to R flank). Negative for dysuria, hematuria, vaginal bleeding and vaginal discharge.  Musculoskeletal: Positive for back pain. Negative for myalgias and arthralgias.  Skin: Positive for wound (abscess L buttock). Negative for color change.  Allergic/Immunologic: Positive for immunocompromised state (on plaquenil).  Neurological: Positive for headaches. Negative for weakness and numbness. Facial asymmetry: intermittent x1 wk.  Psychiatric/Behavioral: Negative for confusion.   10 Systems reviewed and are negative for acute change except as noted in the HPI.    Allergies  Zithromax  Home Medications   Prior to Admission medications   Medication Sig Start Date End Date Taking? Authorizing Provider  Albuterol Sulfate (PROAIR RESPICLICK) 108 (90 BASE) MCG/ACT AEPB Inhale 2 puffs into the lungs every 6 (six) hours as needed. 04/03/15   Ambrose Finland, NP  desonide (DESOWEN) 0.05 % ointment Apply 1 application topically 2 (two) times daily.  06/23/15   Historical Provider, MD  diclofenac (VOLTAREN) 75 MG EC tablet Take 1 tablet (75 mg total) by mouth 2 (two) times daily with a meal. 07/30/15   Levie Heritage, DO  diphenhydrAMINE (BENADRYL) 25 mg capsule Take by mouth. 01/15/15   Historical Provider, MD  fluocinonide ointment (LIDEX) 0.05 % Apply 1 application topically 2 (two) times daily.  06/23/15   Historical Provider, MD  hydroxychloroquine (PLAQUENIL) 200 MG tablet Take 1 tablet by mouth daily.    Historical Provider, MD  oxyCODONE-acetaminophen (ROXICET) 5-325 MG per tablet Take 1-2 tablets by mouth every 6 (six)  hours as needed for severe pain. 07/13/15   Rhona Raider Stinson, DO  pantoprazole (PROTONIX) 40 MG tablet Take 40 mg by mouth daily.  08/01/15   Historical Provider, MD  predniSONE (DELTASONE) 20 MG tablet TAKE 2 TABLET(S) BY MOUTH DAILY WITH BREAKFAST 07/24/15   Kalman Shan, MD   BP 154/105 mmHg  Pulse 85  Temp(Src) 98.3 F (36.8 C) (Oral)  Resp 16  SpO2 100%  LMP 08/11/2015 (Approximate) Physical Exam  Constitutional: She is oriented to person, place, and time. Vital signs are normal. She appears well-developed and well-nourished.  Non-toxic appearance. No distress.  Afebrile, nontoxic, NAD  HENT:  Head: Normocephalic and atraumatic.  Mouth/Throat: Oropharynx is clear and moist and mucous membranes are normal.  Eyes: Conjunctivae and EOM are normal. Pupils are equal, round, and reactive to light. Right eye exhibits no discharge. Left eye exhibits no discharge.  PERRL, EOMI, no nystagmus, no visual field deficits   Neck: Normal range of motion. Neck supple. No spinous process tenderness and no muscular tenderness present. No rigidity. Normal range of motion present.  FROM intact without spinous process TTP, no bony stepoffs or deformities, no paraspinous muscle TTP or muscle spasms. No rigidity or meningeal signs. No bruising or swelling.   Cardiovascular: Normal rate, regular rhythm,  normal heart sounds and intact distal pulses.  Exam reveals no gallop and no friction rub.   No murmur heard. Pulmonary/Chest: Effort normal and breath sounds normal. No respiratory distress. She has no decreased breath sounds. She has no wheezes. She has no rhonchi. She has no rales. She exhibits tenderness. She exhibits no crepitus, no deformity and no retraction.    Mild R lateral/posterior rib cage TTP, no crepitus, retractions, or deformities.  Abdominal: Soft. Normal appearance and bowel sounds are normal. She exhibits no distension. There is tenderness in the right upper quadrant. There is CVA tenderness  (mild R sided) and positive Murphy's sign. There is no rigidity, no rebound, no guarding and no tenderness at McBurney's point.  Soft, nondistended, +BS throughout, with mild RUQ TTP, no r/g/r, +murphy's, neg mcburney's, +mild R sided CVA TTP/R ribcage tenderness   Musculoskeletal: Normal range of motion.  Neurological: She is alert and oriented to person, place, and time. She has normal strength. No cranial nerve deficit or sensory deficit. Coordination and gait normal. GCS eye subscore is 4. GCS verbal subscore is 5. GCS motor subscore is 6.  CN 2-12 grossly intact A&O x4 GCS 15 Sensation and strength intact Gait nonataxic Coordination WNL Neg pronator drift   Skin: Skin is warm and dry. Lesion noted. No rash noted. There is erythema.     Actively draining abscess to just above L buttock, purulent drainage expressed, with mild induration surrounding the area, no pockets of fluctuance, and mild erythema/warmth to the surrounding area.   Psychiatric: She has a normal mood and affect.  Nursing note and vitals reviewed.   ED Course  Procedures (including critical care time) Labs Review Labs Reviewed  BASIC METABOLIC PANEL - Abnormal; Notable for the following:    Potassium 3.3 (*)    Glucose, Bld 131 (*)    All other components within normal limits  HEPATIC FUNCTION PANEL - Abnormal; Notable for the following:    Bilirubin, Direct <0.1 (*)    All other components within normal limits  URINALYSIS, ROUTINE W REFLEX MICROSCOPIC (NOT AT [facility]) - Abnormal; Notable for the following:    Hgb urine dipstick LARGE (*)    All other components within normal limits  URINE MICROSCOPIC-ADD ON - Abnormal; Notable for the following:    Squamous Epithelial / LPF FEW (*)    All other components within normal limits  WOUND CULTURE  CBC  I-STAT TROPOININ, ED    Imaging Review Dg Chest 2 View  08/14/2015   CLINICAL DATA:  Right rib pain, vomiting, headache  EXAM: CHEST  2 VIEW  COMPARISON:  CT  chest dated 05/11/2015  FINDINGS: Chronic bilateral perihilar opacities, likely reflecting chronic interstitial lung disease. No superimposed opacities suspicious for pneumonia. No pleural effusion or pneumothorax.  The heart is normal in size.  Visualized osseous structures are within normal limits.  IMPRESSION: Stable chronic interstitial lung disease.   Electronically Signed   By: Charline Bills M.D.   On: 08/14/2015 17:36   US Abdomen Complete  08/14/2015   CLINICAL DATA:  Right upper quadrant tenderness for 1 week  EXAM: ULTRASOUND ABDOMEN COMPLETE  COMPARISON:  None.  FINDINGS: Gallbladder: No gallstones or wall thickening visualized. No sonographic Murphy sign noted.  Common bile duct: Diameter: 3 mm  Liver: No focal lesion identified. Within normal limits in parenchymal echogenicity.  IVC: No abnormality visualized.  Pancreas: Visualized portion unremarkable.  Spleen: Size and appearance within normal limits.  Right Kidney: Length: 10.8 cm. Echogenicity  within normal limits. No mass or hydronephrosis visualized.  Left Kidney: Length: 12.5 cm. Echogenicity within normal limits. No mass or hydronephrosis visualized.  Abdominal aorta: No aneurysm visualized.  Other findings: None.  IMPRESSION: Normal study.   Electronically Signed   By: Charlett Nose M.D.   On: 08/14/2015 18:16   I have personally reviewed and evaluated these images and lab results as part of my medical decision-making.   EKG Interpretation   Date/Time:  Friday August 14 2015 15:28:12 EDT Ventricular Rate:  90 PR Interval:  140 QRS Duration: 84 QT Interval:  368 QTC Calculation: 450 R Axis:   26 Text Interpretation:  Normal sinus rhythm T wave abnormality, consider  inferolateral ischemia Abnormal ECG No significant change since last  tracing Confirmed by ALLEN  MD, ANTHONY ([id number]) on 08/14/2015 3:32:20 PM      MDM   Final diagnoses:  Abscess and cellulitis  Chronic nonintractable headache, unspecified headache  type  Nausea vomiting and diarrhea  HTN (hypertension), benign  Hypokalemia  Costochondral pain    24 y.o. female here with buttock abscess that spontaneously ruptured yesterday. Draining purulent material, mild induration around lesion with mild erythema/warmth, improving per pt. Will sent wound culture and give vanc here then d/c home with bactrim/keflex. Also having 1wk of intermittent HA similar to prior headaches, nonfocal neuro exam, doubt need for head CT, will give migraine cocktail. Additionally has R flank/rib pain, on exam RUQ TTP with mild R CVA tenderness, will obtain labs, urinalysis, and U/S. In triage cardiac labs/CXR were ordered, still pending. Will reassess shortly.   6:46 PM U/A clear without evidence of UTI/pyelo. Trop neg. EKG unchanged from prior. BMP WNL aside from mild hypoK. LFTs WNL. CBC WNL. U/S abdomen unremarkable. CXR clear aside from chronic interstitial lung disease changes. Pain and nausea improved. Tolerating PO well here. Will d/c home with pain meds, bactrim/keflex, and zofran. Will have her f/up with PCP in 3-5 days. I explained the diagnosis and have given explicit precautions to return to the ER including for any other new or worsening symptoms. The patient understands and accepts the medical plan as it's been dictated and I have answered their questions. Discharge instructions concerning home care and prescriptions have been given. The patient is STABLE and is discharged to home in good condition.  BP 151/103 mmHg  Pulse 98  Temp(Src) 98.3 F (36.8 C) (Oral)  Resp 18  SpO2 92%  LMP 08/11/2015  Meds ordered this encounter  Medications  . metoCLOPramide (REGLAN) injection 10 mg    Sig:    And  . diphenhydrAMINE (BENADRYL) injection 25 mg    Sig:    And  . sodium chloride 0.9 % bolus 1,000 mL    Sig:    And  . ketorolac (TORADOL) 30 MG/ML injection 30 mg    Sig:   . vancomycin (VANCOCIN) IVPB 1000 mg/200 mL premix    Sig:     Order Specific  Question:  Indication:    Answer:  Cellulitis  . ondansetron (ZOFRAN) 8 MG tablet    Sig: Take 1 tablet (8 mg total) by mouth every 8 (eight) hours as needed for nausea or vomiting.    Dispense:  10 tablet    Refill:  0    Order Specific Question:  Supervising Provider    Answer:  Hyacinth Meeker, BRIAN [[id number]]  . naproxen (NAPROSYN) 500 MG tablet    Sig: Take 1 tablet (500 mg total) by mouth 2 (two) times daily as  needed for mild pain, moderate pain or headache (TAKE WITH MEALS.).    Dispense:  20 tablet    Refill:  0    Order Specific Question:  Supervising Provider    Answer:  MILLER, BRIAN [[id number]]  . HYDROcodone-acetaminophen (NORCO) 5-325 MG per tablet    Sig: Take 1 tablet by mouth every 6 (six) hours as needed for severe pain.    Dispense:  6 tablet    Refill:  0    Order Specific Question:  Supervising Provider    Answer:  MILLER, BRIAN [[id number]]  . sulfamethoxazole-trimethoprim (BACTRIM DS,SEPTRA DS) 800-160 MG per tablet    Sig: Take 1 tablet by mouth 2 (two) times daily.    Dispense:  14 tablet    Refill:  0    Order Specific Question:  Supervising Provider    Answer:  MILLER, BRIAN [[id number]]  . cephALEXin (KEFLEX) 500 MG capsule    Sig: 2 caps po bid x 7 days    Dispense:  28 capsule    Refill:  0    Order Specific Question:  Supervising Provider    Answer:  Eber Hong [[id number]]     Mercedes Camprubi-Soms, PA-C 08/14/15 1857  Mancel Bale, MD 08/14/15 2328

## 2015-08-14 NOTE — Discharge Instructions (Signed)
Keep wound clean and dry. Apply warm compresses to affected area throughout the day. Take antibiotic until it is finished. Take naprosyn and norco as directed, as needed for pain but do not drive or operate machinery with pain medication use. Followup with [facility]Primary Care doctor in 3-5 days for wound recheck. Monitor area for signs of infection to include, but not limited to: increasing pain, redness, drainage/pus, or swelling. Return to emergency department for emergent changing or worsening symptoms.  Use zofran as prescribed, as needed for nausea. Stay well hydrated with small sips of fluids throughout the day. Follow a BRAT (banana-rice-applesauce-toast) diet as described below for the next 24-48 hours. The 'BRAT' diet is suggested, then progress to diet as tolerated as symptoms abate. Call your regular doctor if bloody stools, persistent diarrhea, vomiting, fever or abdominal pain. Return to ER for changing or worsening of symptoms.  Food Choices to Help Relieve Diarrhea When you have diarrhea, the foods you eat and your eating habits are very important. Choosing the right foods and drinks can help relieve diarrhea. Also, because diarrhea can last up to 7 days, you need to replace lost fluids and electrolytes (such as sodium, potassium, and chloride) in order to help prevent dehydration.  WHAT GENERAL GUIDELINES DO I NEED TO FOLLOW?  Slowly drink 1 cup (8 oz) of fluid for each episode of diarrhea. If you are getting enough fluid, your urine will be clear or pale yellow.  Eat starchy foods. Some good choices include white rice, white toast, pasta, low-fiber cereal, baked potatoes (without the skin), saltine crackers, and bagels.  Avoid large servings of any cooked vegetables.  Limit fruit to two servings per day. A serving is  cup or 1 small piece.  Choose foods with less than 2 g of fiber per serving.  Limit fats to less than 8 tsp (38 g) per day.  Avoid fried  foods.  Eat foods that have probiotics in them. Probiotics can be found in certain dairy products.  Avoid foods and beverages that may increase the speed at which food moves through the stomach and intestines (gastrointestinal tract). Things to avoid include:  High-fiber foods, such as dried fruit, raw fruits and vegetables, nuts, seeds, and whole grain foods.  Spicy foods and high-fat foods.  Foods and beverages sweetened with high-fructose corn syrup, honey, or sugar alcohols such as xylitol, sorbitol, and mannitol. WHAT FOODS ARE RECOMMENDED? Grains White rice. White, [place], or pita breads (fresh or toasted), including plain rolls, buns, or bagels. White pasta. Saltine, soda, or graham crackers. Pretzels. Low-fiber cereal. Cooked cereals made with water (such as cornmeal, farina, or cream cereals). Plain muffins. Matzo. Melba toast. Zwieback.  Vegetables Potatoes (without the skin). Strained tomato and vegetable juices. Most well-cooked and canned vegetables without seeds. Tender lettuce. Fruits Cooked or canned applesauce, apricots, cherries, fruit cocktail, grapefruit, peaches, pears, or plums. Fresh bananas, apples without skin, cherries, grapes, cantaloupe, grapefruit, peaches, oranges, or plums.  Meat and Other Protein Products Baked or boiled chicken. Eggs. Tofu. Fish. Seafood. Smooth peanut butter. Ground or well-cooked tender beef, ham, veal, lamb, pork, or poultry.  Dairy Plain yogurt, kefir, and unsweetened liquid yogurt. Lactose-free milk, buttermilk, or soy milk. Plain hard cheese. Beverages Sport drinks. Clear broths. Diluted fruit juices (except prune). Regular, caffeine-free sodas such as ginger ale. Water. Decaffeinated teas. Oral rehydration solutions. Sugar-free beverages not sweetened with sugar alcohols. Other Bouillon, broth, or soups made from recommended foods.  The items listed above may not  be a complete list of recommended foods or beverages. Contact your  dietitian for more options. WHAT FOODS ARE NOT RECOMMENDED? Grains Whole grain, whole wheat, bran, or rye breads, rolls, pastas, crackers, and cereals. Wild or brown rice. Cereals that contain more than 2 g of fiber per serving. Corn tortillas or taco shells. Cooked or dry oatmeal. Granola. Popcorn. Vegetables Raw vegetables. Cabbage, broccoli, Brussels sprouts, artichokes, baked beans, beet greens, corn, kale, legumes, peas, sweet potatoes, and yams. Potato skins. Cooked spinach and cabbage. Fruits Dried fruit, including raisins and dates. Raw fruits. Stewed or dried prunes. Fresh apples with skin, apricots, mangoes, pears, raspberries, and strawberries.  Meat and Other Protein Products Chunky peanut butter. Nuts and seeds. Beans and lentils. Tomasa Blase.  Dairy High-fat cheeses. Milk, chocolate milk, and beverages made with milk, such as milk shakes. Cream. Ice cream. Sweets and Desserts Sweet rolls, doughnuts, and sweet breads. Pancakes and waffles. Fats and Oils Butter. Cream sauces. Margarine. Salad oils. Plain salad dressings. Olives. Avocados.  Beverages Caffeinated beverages (such as coffee, tea, soda, or energy drinks). Alcoholic beverages. Fruit juices with pulp. Prune juice. Soft drinks sweetened with high-fructose corn syrup or sugar alcohols. Other Coconut. Hot sauce. Chili powder. Mayonnaise. Gravy. Cream-based or milk-based soups.  The items listed above may not be a complete list of foods and beverages to avoid. Contact your dietitian for more information. WHAT SHOULD I DO IF I BECOME DEHYDRATED? Diarrhea can sometimes lead to dehydration. Signs of dehydration include dark urine and dry mouth and skin. If you think you are dehydrated, you should rehydrate with an oral rehydration solution. These solutions can be purchased at pharmacies, retail stores, or online.  Drink -1 cup (120-240 mL) of oral rehydration solution each time you have an episode of diarrhea. If drinking this amount  makes your diarrhea worse, try drinking smaller amounts more often. For example, drink 1-3 tsp (5-15 mL) every 5-10 minutes.  A general rule for staying hydrated is to drink 1-2 L of fluid per day. Talk to your health care provider about the specific amount you should be drinking each day. Drink enough fluids to keep your urine clear or pale yellow. Document Released: 01/28/2004 Document Revised: 11/12/2013 Document Reviewed: 09/30/2013 [facility] Patient Information 2015 [place], [place]. This information is not intended to replace advice given to you by your health care provider. Make sure you discuss any questions you have with your health care provider.    Cellulitis Cellulitis is an infection of the skin and the tissue under the skin. The infected area is usually red and tender. This happens most often in the arms and lower legs. HOME CARE   Take your antibiotic medicine as told. Finish the medicine even if you start to feel better.  Keep the infected arm or leg raised (elevated).  Put a warm cloth on the area up to 4 times per day.  Only take medicines as told by your doctor.  Keep all doctor visits as told. GET HELP IF:  You see red streaks on the skin coming from the infected area.  Your red area gets bigger or turns a dark color.  Your bone or joint under the infected area is painful after the skin heals.  Your infection comes back in the same area or different area.  You have a puffy (swollen) bump in the infected area.  You have new symptoms.  You have a fever. GET HELP RIGHT AWAY IF:   You feel very sleepy.  You throw up (  vomit) or have watery poop (diarrhea).  You feel sick and have muscle aches and pains. MAKE SURE YOU:   Understand these instructions.  Will watch your condition.  Will get help right away if you are not doing well or get worse. Document Released: 04/25/2008 Document Revised: 03/24/2014 Document Reviewed: 01/23/2012 [facility] Patient  Information 2015 [place], [place]. This information is not intended to replace advice given to you by your health care provider. Make sure you discuss any questions you have with your health care provider.  Chest Wall Pain Chest wall pain is pain felt in or around the chest bones and muscles. It may take up to 6 weeks to get better. It may take longer if you are active. Chest wall pain can happen on its own. Other times, things like germs, injury, coughing, or exercise can cause the pain. HOME CARE   Avoid activities that make you tired or cause pain. Try not to use your chest, belly (abdominal), or side muscles. Do not use heavy weights.  Put ice on the sore area.  Put ice in a plastic bag.  Place a towel between your skin and the bag.  Leave the ice on for 15-20 minutes for the first 2 days.  Only take medicine as told by your doctor. GET HELP RIGHT AWAY IF:   You have more pain or are very uncomfortable.  You have a fever.  Your chest pain gets worse.  You have new problems.  You feel sick to your stomach (nauseous) or throw up (vomit).  You start to sweat or feel lightheaded.  You have a cough with mucus (phlegm).  You cough up blood. MAKE SURE YOU:   Understand these instructions.  Will watch your condition.  Will get help right away if you are not doing well or get worse. Document Released: 04/25/2008 Document Revised: 01/30/2012 Document Reviewed: 07/04/2011 [facility] Patient Information 2015 [place], [place]. This information is not intended to replace advice given to you by your health care provider. Make sure you discuss any questions you have with your health care provider.  General Headache Without Cause A headache is pain or discomfort felt around the head or neck area. The specific cause of a headache may not be found. There are many causes and types of headaches. A few common ones are:  Tension headaches.  Migraine headaches.  Cluster  headaches.  Chronic daily headaches. HOME CARE INSTRUCTIONS   Keep all follow-up appointments with your caregiver or any specialist referral.  Only take over-the-counter or prescription medicines for pain or discomfort as directed by your caregiver.  Lie down in a dark, quiet room when you have a headache.  Keep a headache journal to find out what may trigger your migraine headaches. For example, write down:  What you eat and drink.  How much sleep you get.  Any change to your diet or medicines.  Try massage or other relaxation techniques.  Put ice packs or heat on the head and neck. Use these 3 to 4 times per day for 15 to 20 minutes each time, or as needed.  Limit stress.  Sit up straight, and do not tense your muscles.  Quit smoking if you smoke.  Limit alcohol use.  Decrease the amount of caffeine you drink, or stop drinking caffeine.  Eat and sleep on a regular schedule.  Get 7 to 9 hours of sleep, or as recommended by your caregiver.  Keep lights dim if bright lights bother you and make your  headaches worse. SEEK MEDICAL CARE IF:   You have problems with the medicines you were prescribed.  Your medicines are not working.  You have a change from the usual headache.  You have nausea or vomiting. SEEK IMMEDIATE MEDICAL CARE IF:   Your headache becomes severe.  You have a fever.  You have a stiff neck.  You have loss of vision.  You have muscular weakness or loss of muscle control.  You start losing your balance or have trouble walking.  You feel faint or pass out.  You have severe symptoms that are different from your first symptoms. MAKE SURE YOU:   Understand these instructions.  Will watch your condition.  Will get help right away if you are not doing well or get worse. Document Released: 11/07/2005 Document Revised: 01/30/2012 Document Reviewed: 11/23/2011 [facility] Patient Information 2015 [place], [place]. This information is not intended  to replace advice given to you by your health care provider. Make sure you discuss any questions you have with your health care provider.  Hypokalemia Hypokalemia means that the amount of potassium in the blood is lower than normal.Potassium is a chemical, called an electrolyte, that helps regulate the amount of fluid in the body. It also stimulates muscle contraction and helps nerves function properly.Most of the body's potassium is inside of cells, and only a very small amount is in the blood. Because the amount in the blood is so small, minor changes can be life-threatening. CAUSES  Antibiotics.  Diarrhea or vomiting.  Using laxatives too much, which can cause diarrhea.  Chronic kidney disease.  Water pills (diuretics).  Eating disorders (bulimia).  Low magnesium level.  Sweating a lot. SIGNS AND SYMPTOMS  Weakness.  Constipation.  Fatigue.  Muscle cramps.  Mental confusion.  Skipped heartbeats or irregular heartbeat (palpitations).  Tingling or numbness. DIAGNOSIS  Your health care provider can diagnose hypokalemia with blood tests. In addition to checking your potassium level, your health care provider may also check other lab tests. TREATMENT Hypokalemia can be treated with potassium supplements taken by mouth or adjustments in your current medicines. If your potassium level is very low, you may need to get potassium through a vein (IV) and be monitored in the hospital. A diet high in potassium is also helpful. Foods high in potassium are:  Nuts, such as peanuts and pistachios.  Seeds, such as sunflower seeds and pumpkin seeds.  Peas, lentils, and lima beans.  Whole grain and bran cereals and breads.  Fresh fruit and vegetables, such as apricots, avocado, bananas, cantaloupe, kiwi, oranges, tomatoes, asparagus, and potatoes.  Orange and tomato juices.  Red meats.  Fruit yogurt. HOME CARE INSTRUCTIONS  Take all medicines as prescribed by your health  care provider.  Maintain a healthy diet by including nutritious food, such as fruits, vegetables, nuts, whole grains, and lean meats.  If you are taking a laxative, be sure to follow the directions on the label. SEEK MEDICAL CARE IF:  Your weakness gets worse.  You feel your heart pounding or racing.  You are vomiting or having diarrhea.  You are diabetic and having trouble keeping your blood glucose in the normal range. SEEK IMMEDIATE MEDICAL CARE IF:  You have chest pain, shortness of breath, or dizziness.  You are vomiting or having diarrhea for more than 2 days.  You faint. MAKE SURE YOU:   Understand these instructions.  Will watch your condition.  Will get help right away if you are not doing well or get  worse. Document Released: 11/07/2005 Document Revised: 08/28/2013 Document Reviewed: 05/10/2013 [facility] Patient Information 2015 [place], [place]. This information is not intended to replace advice given to you by your health care provider. Make sure you discuss any questions you have with your health care provider.  Potassium Content of Foods Potassium is a mineral found in many foods and drinks. It helps keep fluids and minerals balanced in your body and affects how steadily your heart beats. Potassium also helps control your blood pressure and keep your muscles and nervous system healthy. Certain health conditions and medicines may change the balance of potassium in your body. When this happens, you can help balance your level of potassium through the foods that you do or do not eat. Your health care provider or dietitian may recommend an amount of potassium that you should have each day. The following lists of foods provide the amount of potassium (in parentheses) per serving in each item. HIGH IN POTASSIUM  The following foods and beverages have 200 mg or more of potassium per serving:  Apricots, 2 raw or 5 dry (200 mg).  Artichoke, 1 medium (345 mg).  Avocado,  raw,  each (245 mg).  Banana, 1 medium (425 mg).  Beans, lima, or baked beans, canned,  cup (280 mg).  Beans, white, canned,  cup (595 mg).  Beef roast, 3 oz (320 mg).  Beef, ground, 3 oz (270 mg).  Beets, raw or cooked,  cup (260 mg).  Bran muffin, 2 oz (300 mg).  Broccoli,  cup (230 mg).  Brussels sprouts,  cup (250 mg).  Cantaloupe,  cup (215 mg).  Cereal, 100% bran,  cup (200-400 mg).  Cheeseburger, single, fast food, 1 each (225-400 mg).  Chicken, 3 oz (220 mg).  Clams, canned, 3 oz (535 mg).  Crab, 3 oz (225 mg).  Dates, 5 each (270 mg).  Dried beans and peas,  cup (300-475 mg).  Figs, dried, 2 each (260 mg).  Fish: halibut, tuna, cod, snapper, 3 oz (480 mg).  Fish: salmon, haddock, swordfish, perch, 3 oz (300 mg).  Fish, tuna, canned 3 oz (200 mg).  [place] fries, fast food, 3 oz (470 mg).  Granola with fruit and nuts,  cup (200 mg).  Grapefruit juice,  cup (200 mg).  Greens, beet,  cup (655 mg).  Honeydew melon,  cup (200 mg).  Kale, raw, 1 cup (300 mg).  Kiwi, 1 medium (240 mg).  Kohlrabi, rutabaga, parsnips,  cup (280 mg).  Lentils,  cup (365 mg).  Mango, 1 each (325 mg).  Milk, chocolate, 1 cup (420 mg).  Milk: nonfat, low-fat, whole, buttermilk, 1 cup (350-380 mg).  Molasses, 1 Tbsp (295 mg).  Mushrooms,  cup (280) mg.  Nectarine, 1 each (275 mg).  Nuts: almonds, peanuts, hazelnuts, [place], cashew, mixed, 1 oz (200 mg).  Nuts, pistachios, 1 oz (295 mg).  Orange, 1 each (240 mg).  Orange juice,  cup (235 mg).  Papaya, medium,  fruit (390 mg).  Peanut butter, chunky, 2 Tbsp (240 mg).  Peanut butter, smooth, 2 Tbsp (210 mg).  Pear, 1 medium (200 mg).  Pomegranate, 1 whole (400 mg).  Pomegranate juice,  cup (215 mg).  Pork, 3 oz (350 mg).  Potato chips, salted, 1 oz (465 mg).  Potato, baked with skin, 1 medium (925 mg).  Potatoes, boiled,  cup (255 mg).  Potatoes, mashed,  cup (330  mg).  Prune juice,  cup (370 mg).  Prunes, 5 each (305 mg).  Pudding, chocolate,  cup (230 mg).  Pumpkin, canned,  cup (250 mg).  Raisins, seedless,  cup (270 mg).  Seeds, sunflower or pumpkin, 1 oz (240 mg).  Soy milk, 1 cup (300 mg).  Spinach,  cup (420 mg).  Spinach, canned,  cup (370 mg).  Sweet potato, baked with skin, 1 medium (450 mg).  Swiss chard,  cup (480 mg).  Tomato or vegetable juice,  cup (275 mg).  Tomato sauce or puree,  cup (400-550 mg).  Tomato, raw, 1 medium (290 mg).  Tomatoes, canned,  cup (200-300 mg).  [place], 3 oz (250 mg).  Wheat germ, 1 oz (250 mg).  Winter squash,  cup (250 mg).  Yogurt, plain or fruited, 6 oz (260-435 mg).  Zucchini,  cup (220 mg). MODERATE IN POTASSIUM The following foods and beverages have 50-200 mg of potassium per serving:  Apple, 1 each (150 mg).  Apple juice,  cup (150 mg).  Applesauce,  cup (90 mg).  Apricot nectar,  cup (140 mg).  Asparagus, small spears,  cup or 6 spears (155 mg).  Bagel, cinnamon raisin, 1 each (130 mg).  Bagel, egg or plain, 4 in., 1 each (70 mg).  Beans, green,  cup (90 mg).  Beans, yellow,  cup (190 mg).  Beer, regular, 12 oz (100 mg).  Beets, canned,  cup (125 mg).  Blackberries,  cup (115 mg).  Blueberries,  cup (60 mg).  Bread, whole wheat, 1 slice (70 mg).  Broccoli, raw,  cup (145 mg).  Cabbage,  cup (150 mg).  Carrots, cooked or raw,  cup (180 mg).  Cauliflower, raw,  cup (150 mg).  Celery, raw,  cup (155 mg).  Cereal, bran flakes, cup (120-150 mg).  Cheese, cottage,  cup (110 mg).  Cherries, 10 each (150 mg).  Chocolate, 1 oz bar (165 mg).  Coffee, brewed 6 oz (90 mg).  Corn,  cup or 1 ear (195 mg).  Cucumbers,  cup (80 mg).  Egg, large, 1 each (60 mg).  Eggplant,  cup (60 mg).  Endive, raw, cup (80 mg).  English muffin, 1 each (65 mg).  Fish, orange roughy, 3 oz (150 mg).  Frankfurter, beef or  pork, 1 each (75 mg).  Fruit cocktail,  cup (115 mg).  Grape juice,  cup (170 mg).  Grapefruit,  fruit (175 mg).  Grapes,  cup (155 mg).  Greens: kale, turnip, collard,  cup (110-150 mg).  Ice cream or frozen yogurt, chocolate,  cup (175 mg).  Ice cream or frozen yogurt, vanilla,  cup (120-150 mg).  Lemons, limes, 1 each (80 mg).  Lettuce, all types, 1 cup (100 mg).  Mixed vegetables,  cup (150 mg).  Mushrooms, raw,  cup (110 mg).  Nuts: walnuts, pecans, or macadamia, 1 oz (125 mg).  Oatmeal,  cup (80 mg).  Okra,  cup (110 mg).  Onions, raw,  cup (120 mg).  Peach, 1 each (185 mg).  Peaches, canned,  cup (120 mg).  Pears, canned,  cup (120 mg).  Peas, green, frozen,  cup (90 mg).  Peppers, green,  cup (130 mg).  Peppers, red,  cup (160 mg).  Pineapple juice,  cup (165 mg).  Pineapple, fresh or canned,  cup (100 mg).  Plums, 1 each (105 mg).  Pudding, vanilla,  cup (150 mg).  Raspberries,  cup (90 mg).  Rhubarb,  cup (115 mg).  Rice, wild,  cup (80 mg).  Shrimp, 3 oz (155 mg).  Spinach, raw, 1 cup (170 mg).  Strawberries,  cup (  125 mg).  Summer squash  cup (175-200 mg).  Swiss chard, raw, 1 cup (135 mg).  Tangerines, 1 each (140 mg).  Tea, brewed, 6 oz (65 mg).  Turnips,  cup (140 mg).  Watermelon,  cup (85 mg).  Wine, red, table, 5 oz (180 mg).  Wine, white, table, 5 oz (100 mg). LOW IN POTASSIUM The following foods and beverages have less than 50 mg of potassium per serving.  Bread, white, 1 slice (30 mg).  Carbonated beverages, 12 oz (less than 5 mg).  Cheese, 1 oz (20-30 mg).  Cranberries,  cup (45 mg).  Cranberry juice cocktail,  cup (20 mg).  Fats and oils, 1 Tbsp (less than 5 mg).  Hummus, 1 Tbsp (32 mg).  Nectar: papaya, mango, or pear,  cup (35 mg).  Rice, white or brown,  cup (50 mg).  Spaghetti or macaroni,  cup cooked (30 mg).  Tortilla, flour or corn, 1 each (50  mg).  Waffle, 4 in., 1 each (50 mg).  Water chestnuts,  cup (40 mg). Document Released: 06/21/2005 Document Revised: 11/12/2013 Document Reviewed: 10/04/2013 [facility] Patient Information 2015 [place], [place]. This information is not intended to replace advice given to you by your health care provider. Make sure you discuss any questions you have with your health care provider.  Nausea and Vomiting Nausea is a sick feeling that often comes before throwing up (vomiting). Vomiting is a reflex where stomach contents come out of your mouth. Vomiting can cause severe loss of body fluids (dehydration). Children and elderly adults can become dehydrated quickly, especially if they also have diarrhea. Nausea and vomiting are symptoms of a condition or disease. It is important to find the cause of your symptoms. CAUSES   Direct irritation of the stomach lining. This irritation can result from increased acid production (gastroesophageal reflux disease), infection, food poisoning, taking certain medicines (such as nonsteroidal anti-inflammatory drugs), alcohol use, or tobacco use.  Signals from the brain.These signals could be caused by a headache, heat exposure, an inner ear disturbance, increased pressure in the brain from injury, infection, a tumor, or a concussion, pain, emotional stimulus, or metabolic problems.  An obstruction in the gastrointestinal tract (bowel obstruction).  Illnesses such as diabetes, hepatitis, gallbladder problems, appendicitis, kidney problems, cancer, sepsis, atypical symptoms of a heart attack, or eating disorders.  Medical treatments such as chemotherapy and radiation.  Receiving medicine that makes you sleep (general anesthetic) during surgery. DIAGNOSIS Your caregiver may ask for tests to be done if the problems do not improve after a few days. Tests may also be done if symptoms are severe or if the reason for the nausea and vomiting is not clear. Tests may  include:  Urine tests.  Blood tests.  Stool tests.  Cultures (to look for evidence of infection).  X-rays or other imaging studies. Test results can help your caregiver make decisions about treatment or the need for additional tests. TREATMENT You need to stay well hydrated. Drink frequently but in small amounts.You may wish to drink water, sports drinks, clear broth, or eat frozen ice pops or gelatin dessert to help stay hydrated.When you eat, eating slowly may help prevent nausea.There are also some antinausea medicines that may help prevent nausea. HOME CARE INSTRUCTIONS   Take all medicine as directed by your caregiver.  If you do not have an appetite, do not force yourself to eat. However, you must continue to drink fluids.  If you have an appetite, eat a normal diet unless your  caregiver tells you differently.  Eat a variety of complex carbohydrates (rice, wheat, potatoes, bread), lean meats, yogurt, fruits, and vegetables.  Avoid high-fat foods because they are more difficult to digest.  Drink enough water and fluids to keep your urine clear or pale yellow.  If you are dehydrated, ask your caregiver for specific rehydration instructions. Signs of dehydration may include:  Severe thirst.  Dry lips and mouth.  Dizziness.  Dark urine.  Decreasing urine frequency and amount.  Confusion.  Rapid breathing or pulse. SEEK IMMEDIATE MEDICAL CARE IF:   You have blood or brown flecks (like coffee grounds) in your vomit.  You have black or bloody stools.  You have a severe headache or stiff neck.  You are confused.  You have severe abdominal pain.  You have chest pain or trouble breathing.  You do not urinate at least once every 8 hours.  You develop cold or clammy skin.  You continue to vomit for longer than 24 to 48 hours.  You have a fever. MAKE SURE YOU:   Understand these instructions.  Will watch your condition.  Will get help right away if  you are not doing well or get worse. Document Released: 11/07/2005 Document Revised: 01/30/2012 Document Reviewed: 04/06/2011 [facility] Patient Information 2015 [place], [place]. This information is not intended to replace advice given to you by your health care provider. Make sure you discuss any questions you have with your health care provider.

## 2015-08-17 ENCOUNTER — Telehealth (HOSPITAL_COMMUNITY): Payer: Self-pay

## 2015-08-17 LAB — WOUND CULTURE
Gram Stain: NONE SEEN
Special Requests: NORMAL

## 2015-08-17 NOTE — Telephone Encounter (Signed)
Positive for MRSA- treated per protocol. Attempting to contact

## 2015-08-18 ENCOUNTER — Telehealth (HOSPITAL_BASED_OUTPATIENT_CLINIC_OR_DEPARTMENT_OTHER): Payer: Self-pay | Admitting: Emergency Medicine

## 2015-08-18 NOTE — Telephone Encounter (Signed)
Post ED Visit - Positive Culture Follow-up  Culture report reviewed by antimicrobial stewardship pharmacist:   Celedonio Miyamoto, Pharm.D., BCPS  Georgina Pillion, [address].D., BCPS  [place], [place].D., BCPS, AAHIVP  Estella Husk, Pharm.D., BCPS, AAHIVP  [place], [address].D.  Tennis Must, Pharm.D.  Positive wound culture MRSA Treated with bactrim DS and keflex, organism sensitive to the same and no further patient follow-up is required at this time.  Berle Mull 08/18/2015, 9:56 AM

## 2015-08-19 ENCOUNTER — Telehealth (HOSPITAL_BASED_OUTPATIENT_CLINIC_OR_DEPARTMENT_OTHER): Payer: Self-pay | Admitting: Emergency Medicine

## 2015-08-21 ENCOUNTER — Encounter: Payer: Self-pay | Admitting: Internal Medicine

## 2015-08-21 ENCOUNTER — Ambulatory Visit: Payer: Self-pay | Admitting: Internal Medicine

## 2015-08-21 ENCOUNTER — Ambulatory Visit (INDEPENDENT_AMBULATORY_CARE_PROVIDER_SITE_OTHER): Payer: No Typology Code available for payment source | Admitting: Internal Medicine

## 2015-08-21 VITALS — BP 128/72 | HR 77 | Ht 63.0 in | Wt 166.6 lb

## 2015-08-21 DIAGNOSIS — J9611 Chronic respiratory failure with hypoxia: Secondary | ICD-10-CM | POA: Diagnosis not present

## 2015-08-21 DIAGNOSIS — M329 Systemic lupus erythematosus, unspecified: Secondary | ICD-10-CM

## 2015-08-21 DIAGNOSIS — J849 Interstitial pulmonary disease, unspecified: Secondary | ICD-10-CM | POA: Diagnosis not present

## 2015-08-21 DIAGNOSIS — R0789 Other chest pain: Secondary | ICD-10-CM

## 2015-08-21 DIAGNOSIS — Z79899 Other long term (current) drug therapy: Secondary | ICD-10-CM

## 2015-08-21 DIAGNOSIS — G4700 Insomnia, unspecified: Secondary | ICD-10-CM

## 2015-08-21 DIAGNOSIS — R012 Other cardiac sounds: Secondary | ICD-10-CM

## 2015-08-21 HISTORY — DX: Systemic lupus erythematosus, unspecified: M32.9

## 2015-08-21 HISTORY — DX: Other cardiac sounds: R01.2

## 2015-08-21 HISTORY — DX: Insomnia, unspecified: G47.00

## 2015-08-21 NOTE — Patient Instructions (Addendum)
ICD-9-CM ICD-10-CM   1. Chronic respiratory failure with hypoxia 518.83 J96.11    799.02    2. ILD (interstitial lung disease) 515 J84.9   3. Lupus (systemic lupus erythematosus) 710.0 M32.9   4. Other chest pain 786.59 R07.89   5. Loud P2 (pulmonary S2, second heart sound) 785.3 R01.2   6. Encounter for long-term (current) use of high-risk medication V58.69 Z79.899   7. Insomnia 780.52 G47.00    contnue o2 Glad you had flu shot Continue cellcept/prednisone -per DR Gaynell Face No blood work today - were normal 08/14/15 Refer Dr Shirlee Latch for Right heart cath  Followup - Dr Shirlee Latch cards for right heart cath asap  - dr Gaynell Face at [facility] Nov 2016 - refer sleep doc in our office for insomnia - with me in end dec 2016

## 2015-08-21 NOTE — Progress Notes (Signed)
Subjective:    [name], female    DOB: 06/09/1991, 24 y.o.   MRN: [id number]  HPI  ILD with autoimmune features - . On high dose empoiric prednisone Feb 2016 - Bronch TBBx  - "beneign lung tissue" ECHO 02/10/15 - no pulm htn April 2016 - ANA 1:160, Positive , ACE84, A SSA/SSB > 8, RNP, ANCA, ENA, DS-DNA, JO1- negatie  OV 05/15/2015  Chief Complaint  Patient presents with  . Follow-up    Pt is now on 4lpm O2 with activity and 4lpm while sleeping. Pt c/o mild DOE, prod cough with clear and white mucus and CP/tightness at times.      Last seen by me 04/14/2015. Since then she has developed pneumomediastinum because of persistent cough and shortness of breath. The interim she did see [facility] dermatology and did have a skin biopsy which showed  ? vasculits (I remember reviewing this report but cannot find it in media sectin in EMR). She continues on 40 mg prednisone daily. In the interim she did see rheumatologist Dr. Corliss Skains who discussed the situation with me. We mutually agreed that the situation is complex and referred her to [facility] She however did autpimmune panel. She saw Dr. Gaynell Face 04/28/15 at [facility]. I reviewd his notes and her story is depicted accurately byt him.   [facility] repeated autpimmune and ANA - positive 1:2560 (much higher than with [place] in April 2016), ANCA positive but only mild MPO is 21, RF - positive 29, Anti-RO/Anti-LA positive  .   She had PFT - FVC 1.57L/42%, DLCO 6.4/28% - severe ILD  She was starterd on 4L [place] on 04/28/15 with advise to continue pred and rheum [facility] referral made.    She had bronchoscopy 05/14/15 - Clear fluids with Lymphs 23%, Mac 22% and down. , PMN 11% and up.  . I  repeated a CT scan of the chest angiogram 6.20/16 because of elevated d-dimer and we ruled out pulmonary embolism but a pneumomediastinum and bilateral infiltrates persist unchanged. . She continues to have significant dyspnea. She is due to see her  rheumatologist at [facility] and there is talk about adding a second immunomodulators.  She is frustrated by situation. Overall she is no better.   She is open to having right heart catheterization in getting to the bottom of her problems.   OV 08/21/2015  Chief Complaint  Patient presents with  . Follow-up    pt. states breathing is baseline. SOB baseline. dry cough. chest soreness and tightness.   Follow-up severe form of lupus with chronic respiratory hypoxemic failure needing several liters of oxygen  Last seen by me 3 months ago and of June 2016. Since then she has been seen by [facility] Dr. Dustin Flock. Shortly after they took a decision to start her on CellCept she got pregnant. She was advised strongly to terminate the pregnancy with and she miscarried. This is confirmed on review of the chart. Currently she denies any pregnancy. It was a first child. She said she took the loss well. Currently she is back on CellCept and prednisone. She is using several liters of oxygen on a backpack. Overall disease health status is stable but she is continuing to have some atypical chest pains especially with exertion but also at rest.  Only other new complaints she has for me his insomnia that is chronic and she wants to see a doctor for this   Off note review of the chart shows she  went to emergency department on 08/14/2015. Lab results show wound culture from the buttock showing growing MRSA [only realizes after she left the office and at the time of this dictation]. It appears that discharge her on Bactrim and cephalexin. Patient did not complain about this problem at the time of my visit. Other lab work 08/14/2015 shows a creatinine of 0.7 mg percent, normal liver function tests, hemoglobin 13 g percent with normal platelets and normal troponin   Chest x-ray 08/14/2015: That I personally visualized shows stable interstitial lung disease compared to May 2016. Pneumomediastinum not  apparent at least on this chest x-ray      Current outpatient prescriptions:  .  desonide (DESOWEN) 0.05 % ointment, Apply 1 application topically 2 (two) times daily. , Disp: , Rfl:  .  diphenhydrAMINE (BENADRYL) 25 mg capsule, Take 25 mg by mouth every 8 (eight) hours as needed for itching or allergies. , Disp: , Rfl:  .  fluocinonide ointment (LIDEX) 0.05 %, Apply 1 application topically 2 (two) times daily. , Disp: , Rfl:  .  HYDROcodone-acetaminophen (NORCO) 5-325 MG per tablet, Take 1 tablet by mouth every 6 (six) hours as needed for severe pain., Disp: 6 tablet, Rfl: 0 .  mycophenolate (CELLCEPT) 500 MG tablet, Take 500 mg by mouth 2 (two) times daily., Disp: , Rfl:  .  naproxen (NAPROSYN) 500 MG tablet, Take 1 tablet (500 mg total) by mouth 2 (two) times daily as needed for mild pain, moderate pain or headache (TAKE WITH MEALS.)., Disp: 20 tablet, Rfl: 0 .  pantoprazole (PROTONIX) 40 MG tablet, Take 40 mg by mouth daily. , Disp: , Rfl:  .  predniSONE (DELTASONE) 20 MG tablet, Take 20 mg by mouth daily with breakfast., Disp: , Rfl:  .  sulfamethoxazole-trimethoprim (BACTRIM DS,SEPTRA DS) 800-160 MG per tablet, Take 1 tablet by mouth 2 (two) times daily. (Patient not taking: Reported on 08/21/2015), Disp: 14 tablet, Rfl: 0  Immunization History  Administered Date(s) Administered  . HPV 9-valent 01/29/2015, 04/02/2015  . Influenza Split 08/07/2015  . Influenza,inj,Quad PF,36+ Mos 01/08/2015  . Pneumococcal Polysaccharide-23 01/08/2015  . Tdap 04/06/2015     Review of Systems +ve dyspnea,  Chest pain, rash, insomnia, myalgias, arthralgia otherweise 11 point ROS negative     Objective:   Physical Exam  Constitutional: She is oriented to person, place, and time. She appears well-developed and well-nourished. No distress.  HENT:  Head: Normocephalic and atraumatic.  Right Ear: External ear normal.  Left Ear: External ear normal.  Mouth/Throat: Oropharynx is clear and moist. No  oropharyngeal exudate.  o2 on  Eyes: Conjunctivae and EOM are normal. Pupils are equal, round, and reactive to light. Right eye exhibits no discharge. Left eye exhibits no discharge. No scleral icterus.  Neck: Normal range of motion. Neck supple. No JVD present. No tracheal deviation present. No thyromegaly present.  Cardiovascular: Normal rate, regular rhythm and intact distal pulses.  Exam reveals no gallop and no friction rub.   No murmur heard. Loud P2  Pulmonary/Chest: Effort normal. No respiratory distress. She has no wheezes. She has rales. She exhibits no tenderness.  o2 on backpack  Abdominal: Soft. Bowel sounds are normal. She exhibits no distension and no mass. There is no tenderness. There is no rebound and no guarding.  Musculoskeletal: Normal range of motion. She exhibits no edema or tenderness.  Lymphadenopathy:    She has no cervical adenopathy.  Neurological: She is alert and oriented to person, place, and time. She has  normal reflexes. No cranial nerve deficit. She exhibits normal muscle tone. Coordination normal.  Skin: Skin is warm and dry. No rash noted. She is not diaphoretic. No erythema. No pallor.  malara rash +  Psychiatric: She has a normal mood and affect. Her behavior is normal. Judgment and thought content normal.  Vitals reviewed.   Filed Vitals:   08/21/15 1617  BP: 128/72  Pulse: 77  Height:  (1.6 m)  Weight: 166 lb 9.6 oz (75.569 kg)  SpO2: 98%         Assessment & Plan:     ICD-9-CM ICD-10-CM   1. Chronic respiratory failure with hypoxia 518.83 J96.11    799.02    2. ILD (interstitial lung disease) 515 J84.9   3. Lupus (systemic lupus erythematosus) 710.0 M32.9   4. Other chest pain 786.59 R07.89   5. Loud P2 (pulmonary S2, second heart sound) 785.3 R01.2   6. Encounter for long-term (current) use of high-risk medication V58.69 Z79.899   7. Insomnia 780.52 G47.00     Overall this is the best ever seen her with her chronic  respiratory failure and arthralgia and shortness of breath. Chest x-ray also shows that the pneumomediastinum is not any worse [this is from 08/14/2015]. So I think CellCept/prednisone is working. On exam today I thought appreciated new P2 heart sound that was loud. Given the high pretest probability for pulmonary hypertension in this entity I think it is best to get a right heart catheterization. I will go ahead and make a referral to Dr. Shirlee Latch of cardiology department. She does have a follow-up pending with Dr. Gaynell Face therefore I will send him this note  She is giving me a new complaint of insomnia and I'll make a referral to a sleep physician within department  Of note, 08/14/2015 she had wound culture positive for MRSA and this been treated I will make her rheumatologist Dr Corliss Skains in [place] aware of this by sending the note and also Dr. Gaynell Face   contnue o2 Glad you had flu shot Continue cellcept/prednisone -per DR Gaynell Face No blood work today - were normal 08/14/15 Refer Dr Shirlee Latch for Right heart cath  Followup - Dr Shirlee Latch cards for right heart cath asap  - dr Gaynell Face at [facility] Nov 2016 - refer sleep doc in our office for insomnia - with me in end dec 2016     Dr. Kalman Shan, M.D., [facility].C.P Pulmonary and Critical Care Medicine Staff Physician [facility] [place] [facility] Pager: [phone], If no answer or between  15:00h - 7:00h: call [phone]  08/21/2015 4:49 PM

## 2015-08-27 ENCOUNTER — Telehealth (HOSPITAL_COMMUNITY): Payer: Self-pay | Admitting: Vascular Surgery

## 2015-08-27 NOTE — Telephone Encounter (Signed)
Left pt a message to make a appt w/ Mclean next week

## 2015-08-28 NOTE — Telephone Encounter (Signed)
appt sch 10/13

## 2015-09-03 ENCOUNTER — Ambulatory Visit (HOSPITAL_COMMUNITY)
Admission: RE | Admit: 2015-09-03 | Discharge: 2015-09-03 | Disposition: A | Discharge status: Home / Self Care | Payer: No Typology Code available for payment source | Source: Ambulatory Visit | Attending: Cardiology | Admitting: Cardiology

## 2015-09-03 ENCOUNTER — Encounter (HOSPITAL_COMMUNITY): Payer: Self-pay

## 2015-09-03 ENCOUNTER — Encounter (HOSPITAL_COMMUNITY): Payer: Self-pay | Admitting: *Deleted

## 2015-09-03 VITALS — BP 124/92 | HR 100 | Ht 63.0 in | Wt 169.0 lb

## 2015-09-03 DIAGNOSIS — I272 Other secondary pulmonary hypertension: Secondary | ICD-10-CM

## 2015-09-03 DIAGNOSIS — J849 Interstitial pulmonary disease, unspecified: Secondary | ICD-10-CM | POA: Insufficient documentation

## 2015-09-03 DIAGNOSIS — M329 Systemic lupus erythematosus, unspecified: Secondary | ICD-10-CM | POA: Diagnosis not present

## 2015-09-03 DIAGNOSIS — R0789 Other chest pain: Secondary | ICD-10-CM

## 2015-09-03 DIAGNOSIS — K219 Gastro-esophageal reflux disease without esophagitis: Secondary | ICD-10-CM | POA: Insufficient documentation

## 2015-09-03 DIAGNOSIS — Z79899 Other long term (current) drug therapy: Secondary | ICD-10-CM | POA: Insufficient documentation

## 2015-09-03 DIAGNOSIS — Z7952 Long term (current) use of systemic steroids: Secondary | ICD-10-CM | POA: Insufficient documentation

## 2015-09-03 DIAGNOSIS — Z9981 Dependence on supplemental oxygen: Secondary | ICD-10-CM | POA: Insufficient documentation

## 2015-09-03 LAB — BASIC METABOLIC PANEL
Anion gap: 10 (ref 5–15)
BUN: 11 mg/dL (ref 6–20)
CO2: 26 mmol/L (ref 22–32)
CREATININE: 0.69 mg/dL (ref 0.44–1.00)
Calcium: 9.7 mg/dL (ref 8.9–10.3)
Chloride: 105 mmol/L (ref 101–111)
GFR calc non Af Amer: >60 mL/min (ref >60)
Glucose, Bld: 91 mg/dL (ref 65–99)
Potassium: 3.7 mmol/L (ref 3.5–5.1)
SODIUM: 141 mmol/L (ref 135–145)

## 2015-09-03 LAB — CBC
HCT: 43.2 % (ref 36.0–46.0)
Hemoglobin: 14.5 g/dL (ref 12.0–15.0)
MCH: 27.5 pg (ref 26.0–34.0)
MCHC: 33.6 g/dL (ref 30.0–36.0)
MCV: 82 fL (ref 78.0–100.0)
PLATELETS: 203 10*3/uL (ref 150–400)
RBC: 5.27 MIL/uL — ABNORMAL HIGH (ref 3.87–5.11)
RDW: 12.7 % (ref 11.5–15.5)
WBC: 6.3 10*3/uL (ref 4.0–10.5)

## 2015-09-03 LAB — PROTIME-INR
INR: 1.06 (ref 0.00–1.49)
Prothrombin Time: 14 seconds (ref 11.6–15.2)

## 2015-09-04 DIAGNOSIS — I2729 Other secondary pulmonary hypertension: Secondary | ICD-10-CM | POA: Insufficient documentation

## 2015-09-04 NOTE — Progress Notes (Signed)
Patient ID: [name], female   DOB: Sep 26, 1991, 24 y.o.   MRN: [id number] Pulmonology: Dr. Marchelle Gearingamaswamy  24 yo with history of SLE and associated interstitial lung disease presents for evaluation for pulmonary hypertension.  She is followed by Dr Marchelle Gearingamaswamy and [facility].  She is on prednisone and mycophenolate.  She is on home oxygen with exertion and at night.  She has chronic/slowly progressive exertional dyspnea.  She can walk about 300 feet before having to stop.  Dyspnea with stairs.  No chest pain, syncope, or presyncope.  No palpitations.    ECG: NSR, inferolateral TWIs  Labs (9/16): K 3.3, creatinine 0.7  PMH: 1. Interstitial lung disease: She is on oxygen at night and with exertion.  Associated with SLE.   2. H/o pneumomediastinum in the setting of persistent cough.  3. SLE: Has ILD, malar rash, arthalgias.   4. GERD 5. Echo (3/16) with EF 55-60%, no pulmonary hypertension noted.   SH: Nonsmoker, no ETOH, [place] in [place].  FH: Grandfather with CHF.  No SLE.    ROS: All systems reviewed and negative except as per HPI.   Current Outpatient Prescriptions  Medication Sig Dispense Refill  . desonide (DESOWEN) 0.05 % ointment Apply 1 application topically 2 (two) times daily.     . diphenhydrAMINE (BENADRYL) 25 mg capsule Take 25 mg by mouth every 8 (eight) hours as needed for itching or allergies.     . fluocinonide ointment (LIDEX) 0.05 % Apply 1 application topically 2 (two) times daily.     Marland Kitchen. HYDROcodone-acetaminophen (NORCO) 5-325 MG per tablet Take 1 tablet by mouth every 6 (six) hours as needed for severe pain. 6 tablet 0  . mycophenolate (CELLCEPT) 500 MG tablet Take 500 mg by mouth 2 (two) times daily.    . naproxen (NAPROSYN) 500 MG tablet Take 1 tablet (500 mg total) by mouth 2 (two) times daily as needed for mild pain, moderate pain or headache (TAKE WITH MEALS.). 20 tablet 0  . pantoprazole (PROTONIX) 40 MG tablet Take 40 mg by mouth daily.     .  predniSONE (DELTASONE) 20 MG tablet Take 20 mg by mouth daily with breakfast.    . sulfamethoxazole-trimethoprim (BACTRIM DS,SEPTRA DS) 800-160 MG per tablet Take 1 tablet by mouth 2 (two) times daily. 14 tablet 0   No current facility-administered medications for this encounter.   BP 124/92 mmHg  Pulse 100  Ht 5\' 3"  (1.6 m)  Wt 169 lb (76.658 kg)  BMI 29.94 kg/m2  SpO2 98%  LMP 08/11/2015 General: NAD Neck: No JVD, no thyromegaly or thyroid nodule.  Lungs: mild dry crackles at the bases.  CV: Nondisplaced PMI.  Heart regular S1/S2 with prominent P2, no S3/S4, no murmur.  No peripheral edema.  No carotid bruit.  Normal pedal pulses.  Abdomen: Soft, nontender, no hepatosplenomegaly, no distention.  Skin: Malar rash.   Neurologic: Alert and oriented x 3.  Psych: Normal affect. Extremities: No clubbing or cyanosis.  HEENT: Normal.   Assessment/Plan: 1. Pulmonary hypertension: Suspicion for pulmonary hypertension.  Patient has a predisposing condition to group 1 PH, SLE.  She has a loud P2 on exam.  Echo in 3/16 showed normal RV and did not show pulmonary hypertension. Given her progressive worsening, I do think that it is reasonable to look for pulmonary arterial hypertension.  - I will arrange RHC.  I explained the risks/benefits of the procedure to her and she agrees to proceed.  2. SLE: On prednisone and  mycophenolate.  Manifests as malar rash, arthralgias, and ILD.  3. ILD: On oxygen with exertion and at night.  Suspect related to SLE.    Kayla [name] 09/04/2015   

## 2015-09-07 ENCOUNTER — Telehealth (HOSPITAL_COMMUNITY): Payer: Self-pay | Admitting: Cardiology

## 2015-09-07 ENCOUNTER — Other Ambulatory Visit (HOSPITAL_COMMUNITY): Payer: Self-pay | Admitting: *Deleted

## 2015-09-07 DIAGNOSIS — I272 Pulmonary hypertension, unspecified: Secondary | ICD-10-CM

## 2015-09-07 NOTE — Telephone Encounter (Signed)
Patient scheduled for RHC with Dr.McLean on 09/09/15 Cpt [id number] With pts current insurance-[employer]  Per call OZH#[id number]#[id number]

## 2015-09-09 ENCOUNTER — Encounter (HOSPITAL_COMMUNITY): Payer: Self-pay | Admitting: Cardiology

## 2015-09-09 ENCOUNTER — Encounter (HOSPITAL_COMMUNITY)
Admission: RE | Disposition: A | Discharge status: Home / Self Care | Payer: Self-pay | Source: Ambulatory Visit | Attending: Cardiology

## 2015-09-09 ENCOUNTER — Ambulatory Visit (HOSPITAL_COMMUNITY)
Admission: RE | Admit: 2015-09-09 | Discharge: 2015-09-09 | Disposition: A | Discharge status: Home / Self Care | Payer: No Typology Code available for payment source | Source: Ambulatory Visit | Attending: Cardiology | Admitting: Cardiology

## 2015-09-09 DIAGNOSIS — J849 Interstitial pulmonary disease, unspecified: Secondary | ICD-10-CM | POA: Diagnosis not present

## 2015-09-09 DIAGNOSIS — Z9981 Dependence on supplemental oxygen: Secondary | ICD-10-CM | POA: Diagnosis not present

## 2015-09-09 DIAGNOSIS — M329 Systemic lupus erythematosus, unspecified: Secondary | ICD-10-CM | POA: Insufficient documentation

## 2015-09-09 DIAGNOSIS — I272 Other secondary pulmonary hypertension: Secondary | ICD-10-CM | POA: Insufficient documentation

## 2015-09-09 DIAGNOSIS — K219 Gastro-esophageal reflux disease without esophagitis: Secondary | ICD-10-CM | POA: Insufficient documentation

## 2015-09-09 DIAGNOSIS — I270 Primary pulmonary hypertension: Secondary | ICD-10-CM | POA: Diagnosis not present

## 2015-09-09 DIAGNOSIS — I509 Heart failure, unspecified: Secondary | ICD-10-CM | POA: Diagnosis not present

## 2015-09-09 HISTORY — PX: CARDIAC CATHETERIZATION: SHX172

## 2015-09-09 LAB — PREGNANCY, URINE: Preg Test, Ur: NEGATIVE

## 2015-09-09 LAB — POCT I-STAT 3, VENOUS BLOOD GAS (G3P V)
Acid-Base Excess: 1 mmol/L (ref 0.0–2.0)
Bicarbonate: 25.4 mEq/L — ABNORMAL HIGH (ref 20.0–24.0)
O2 SAT: 69 %
PCO2 VEN: 40.7 mmHg — AB (ref 45.0–50.0)
PO2 VEN: 36 mmHg (ref 30.0–45.0)
TCO2: 27 mmol/L (ref 0–100)
pH, Ven: 7.403 — ABNORMAL HIGH (ref 7.250–7.300)

## 2015-09-09 SURGERY — RIGHT HEART CATH
Anesthesia: LOCAL

## 2015-09-09 MED ORDER — SODIUM CHLORIDE 0.9 % IV SOLN: 250.0000 mL | INTRAVENOUS | Status: DC | PRN

## 2015-09-09 MED ORDER — ACETAMINOPHEN 325 MG PO TABS: 650.0000 mg | ORAL_TABLET | ORAL | Status: DC | PRN

## 2015-09-09 MED ORDER — LIDOCAINE HCL (PF) 1 % IJ SOLN
INTRAMUSCULAR | Status: DC | PRN
  Administered 2015-09-09: 13:00:00

## 2015-09-09 MED ORDER — ASPIRIN 81 MG PO CHEW
81.0000 mg | CHEWABLE_TABLET | ORAL | Status: AC
Start: 2015-09-09 — End: 2015-09-09
  Administered 2015-09-09: 81 mg via ORAL

## 2015-09-09 MED ORDER — SODIUM CHLORIDE 0.9 % IJ SOLN: 3.0000 mL | Freq: Two times a day (BID) | INTRAMUSCULAR | Status: DC

## 2015-09-09 MED ORDER — HEPARIN (PORCINE) IN NACL 2-0.9 UNIT/ML-% IJ SOLN
INTRAMUSCULAR | Status: AC
Start: 2015-09-09 — End: 2015-09-09
  Filled 2015-09-09: qty 500

## 2015-09-09 MED ORDER — SODIUM CHLORIDE 0.9 % IV SOLN
INTRAVENOUS | Status: DC
  Administered 2015-09-09: 11:00:00 via INTRAVENOUS

## 2015-09-09 MED ORDER — MIDAZOLAM HCL 2 MG/2ML IJ SOLN
INTRAMUSCULAR | Status: DC | PRN
  Administered 2015-09-09 (×2): 1 mg via INTRAVENOUS

## 2015-09-09 MED ORDER — LIDOCAINE HCL (PF) 1 % IJ SOLN
INTRAMUSCULAR | Status: AC
  Filled 2015-09-09: qty 30

## 2015-09-09 MED ORDER — FENTANYL CITRATE (PF) 100 MCG/2ML IJ SOLN
INTRAMUSCULAR | Status: AC
  Filled 2015-09-09: qty 4

## 2015-09-09 MED ORDER — FENTANYL CITRATE (PF) 100 MCG/2ML IJ SOLN
INTRAMUSCULAR | Status: DC | PRN
  Administered 2015-09-09: 25 ug via INTRAVENOUS

## 2015-09-09 MED ORDER — ASPIRIN 81 MG PO CHEW
CHEWABLE_TABLET | ORAL | Status: AC
  Administered 2015-09-09: 81 mg via ORAL
  Filled 2015-09-09: qty 1

## 2015-09-09 MED ORDER — SODIUM CHLORIDE 0.9 % IJ SOLN: 3.0000 mL | INTRAMUSCULAR | Status: DC | PRN

## 2015-09-09 MED ORDER — ONDANSETRON HCL 4 MG/2ML IJ SOLN: 4.0000 mg | Freq: Four times a day (QID) | INTRAMUSCULAR | Status: DC | PRN

## 2015-09-09 MED ORDER — MIDAZOLAM HCL 2 MG/2ML IJ SOLN
INTRAMUSCULAR | Status: AC
  Filled 2015-09-09: qty 4

## 2015-09-09 SURGICAL SUPPLY — 7 items
CATH BALLN WEDGE 5F 110CM (CATHETERS) ×2 IMPLANT
KIT HEART RIGHT NAMIC (KITS) ×2 IMPLANT
PACK CARDIAC CATHETERIZATION (CUSTOM PROCEDURE TRAY) ×2 IMPLANT
SHEATH FAST CATH BRACH 5F 5CM (SHEATH) ×2 IMPLANT
TRANSDUCER W/STOPCOCK (MISCELLANEOUS) ×2 IMPLANT
TUBING ART PRESS 72  MALE/FEM (TUBING) ×1
TUBING ART PRESS 72 MALE/FEM (TUBING) ×1 IMPLANT

## 2015-09-09 NOTE — Discharge Instructions (Signed)
CALL FOR ANY SIGNS/ SYMPTOMS OF INFECTION: FEVER, REDNESS, DRAINAGE FROM INSERTION SITE.

## 2015-09-09 NOTE — Interval H&P Note (Signed)
History and Physical Interval Note:  09/09/2015 12:08 PM  Kayla [name]  has presented today for surgery, with the diagnosis of chf  The various methods of treatment have been discussed with the patient and family. After consideration of risks, benefits and other options for treatment, the patient has consented to  Procedure(s): Right Heart Cath (N/A) as a surgical intervention .  The patient's history has been reviewed, patient examined, no change in status, stable for surgery.  I have reviewed the patient's chart and labs.  Questions were answered to the patient's satisfaction.     Dalton Chesapeake EnergyMcLean

## 2015-09-09 NOTE — H&P (View-Only) (Signed)
[name], female   DOB: Sep 26, 1991, 24 y.o.   MRN: [id number] Pulmonology: Dr. Marchelle Gearingamaswamy  24 yo with history of SLE and associated interstitial lung disease presents for evaluation for pulmonary hypertension.  She is followed by Dr Marchelle Gearingamaswamy and a [facility].  She is on prednisone and mycophenolate.  She is on home oxygen with exertion and at night.  She has chronic/slowly progressive exertional dyspnea.  She can walk about 300 feet before having to stop.  Dyspnea with stairs.  No chest pain, syncope, or presyncope.  No palpitations.    ECG: NSR, inferolateral TWIs  Labs (9/16): K 3.3, creatinine 0.7  PMH: 1. Interstitial lung disease: She is on oxygen at night and with exertion.  Associated with SLE.   2. H/o pneumomediastinum in the setting of persistent cough.  3. SLE: Has ILD, malar rash, arthalgias.   4. GERD 5. Echo (3/16) with EF 55-60%, no pulmonary hypertension noted.   SH: Nonsmoker, no ETOH, lives in [place].  FH: Grandfather with CHF.  No SLE.    ROS: All systems reviewed and negative except as per HPI.   Current Outpatient Prescriptions  Medication Sig Dispense Refill  . desonide (DESOWEN) 0.05 % ointment Apply 1 application topically 2 (two) times daily.     . diphenhydrAMINE (BENADRYL) 25 mg capsule Take 25 mg by mouth every 8 (eight) hours as needed for itching or allergies.     . fluocinonide ointment (LIDEX) 0.05 % Apply 1 application topically 2 (two) times daily.     Marland Kitchen. HYDROcodone-acetaminophen (NORCO) 5-325 MG per tablet Take 1 tablet by mouth every 6 (six) hours as needed for severe pain. 6 tablet 0  . mycophenolate (CELLCEPT) 500 MG tablet Take 500 mg by mouth 2 (two) times daily.    . naproxen (NAPROSYN) 500 MG tablet Take 1 tablet (500 mg total) by mouth 2 (two) times daily as needed for mild pain, moderate pain or headache (TAKE WITH MEALS.). 20 tablet 0  . pantoprazole (PROTONIX) 40 MG tablet Take 40 mg by mouth daily.     .  predniSONE (DELTASONE) 20 MG tablet Take 20 mg by mouth daily with breakfast.    . sulfamethoxazole-trimethoprim (BACTRIM DS,SEPTRA DS) 800-160 MG per tablet Take 1 tablet by mouth 2 (two) times daily. 14 tablet 0   No current facility-administered medications for this encounter.   BP 124/92 mmHg  Pulse 100  Ht 5\' 3"  (1.6 m)  Wt 169 lb (76.658 kg)  BMI 29.94 kg/m2  SpO2 98%  LMP 08/11/2015 General: NAD Neck: No JVD, no thyromegaly or thyroid nodule.  Lungs: mild dry crackles at the bases.  CV: Nondisplaced PMI.  Heart regular S1/S2 with prominent P2, no S3/S4, no murmur.  No peripheral edema.  No carotid bruit.  Normal pedal pulses.  Abdomen: Soft, nontender, no hepatosplenomegaly, no distention.  Skin: Malar rash.   Neurologic: Alert and oriented x 3.  Psych: Normal affect. Extremities: No clubbing or cyanosis.  HEENT: Normal.   Assessment/Plan: 1. Pulmonary hypertension: Suspicion for pulmonary hypertension.  Patient has a predisposing condition to group 1 PH, SLE.  She has a loud P2 on exam.  Echo in 3/16 showed normal RV and did not show pulmonary hypertension. Given her progressive worsening, I do think that it is reasonable to look for pulmonary arterial hypertension.  - I will arrange RHC.  I explained the risks/benefits of the procedure to her and she agrees to proceed.  2. SLE: On prednisone and  mycophenolate.  Manifests as malar rash, arthralgias, and ILD.  3. ILD: On oxygen with exertion and at night.  Suspect related to SLE.    Marca Ancona 09/04/2015

## 2015-09-23 ENCOUNTER — Telehealth: Payer: Self-pay | Admitting: Internal Medicine

## 2015-09-23 NOTE — Telephone Encounter (Signed)
[phone], pt cb, has very bad cough

## 2015-09-23 NOTE — Telephone Encounter (Signed)
Last seen on 08/21/15 with MR Patient Instructions       ICD-9-CM ICD-10-CM   1. Chronic respiratory failure with hypoxia 518.83 J96.11    799.02    2. ILD (interstitial lung disease) 515 J84.9   3. Lupus (systemic lupus erythematosus) 710.0 M32.9   4. Other chest pain 786.59 R07.89   5. Loud P2 (pulmonary S2, second heart sound) 785.3 R01.2   6. Encounter for long-term (current) use of high-risk medication V58.69 Z79.899   7. Insomnia 780.52 G47.00    contnue o2 Glad you had flu shot Continue cellcept/prednisone -per DR Gaynell FaceMarshall No blood work today - were normal 08/14/15 Refer Dr Shirlee LatchMcLean for Right heart cath  Followup - Dr Shirlee LatchMclean cards for right heart cath asap - [facility]arshall [facility] - refer sleep doc in our office for insomnia - with me in end dec 2016    Called and spoke with pt. She c/o non prod cough, chest tightness/congestion, increased SOB and wheezing. Nausea at times but denies any fever, vomiting, sinus pressure or drainage. She states she has had these symptoms since 09/18/15. I offered her an ov with TP on 09/25/15. She agreed to ov. She voiced understanding and had no further questions. Nothing further needed. Will sign off on message.

## 2015-09-23 NOTE — Telephone Encounter (Signed)
lmtcb X1 for pt  

## 2015-09-25 ENCOUNTER — Ambulatory Visit (INDEPENDENT_AMBULATORY_CARE_PROVIDER_SITE_OTHER)
Admission: RE | Admit: 2015-09-25 | Discharge: 2015-09-25 | Disposition: A | Discharge status: Home / Self Care | Payer: No Typology Code available for payment source | Source: Ambulatory Visit | Attending: Adult Health | Admitting: Adult Health

## 2015-09-25 ENCOUNTER — Ambulatory Visit (HOSPITAL_COMMUNITY)
Admission: RE | Admit: 2015-09-25 | Discharge: 2015-09-25 | Disposition: A | Discharge status: Home / Self Care | Payer: No Typology Code available for payment source | Source: Ambulatory Visit | Attending: Family Medicine | Admitting: Family Medicine

## 2015-09-25 ENCOUNTER — Ambulatory Visit (INDEPENDENT_AMBULATORY_CARE_PROVIDER_SITE_OTHER): Payer: No Typology Code available for payment source | Admitting: Adult Health

## 2015-09-25 ENCOUNTER — Encounter: Payer: Self-pay | Admitting: Family Medicine

## 2015-09-25 ENCOUNTER — Encounter: Payer: Self-pay | Admitting: Adult Health

## 2015-09-25 VITALS — BP 148/98 | HR 98 | Temp 97.9°F | Ht 63.0 in | Wt 165.6 lb

## 2015-09-25 DIAGNOSIS — J9611 Chronic respiratory failure with hypoxia: Secondary | ICD-10-CM | POA: Diagnosis not present

## 2015-09-25 DIAGNOSIS — J849 Interstitial pulmonary disease, unspecified: Secondary | ICD-10-CM

## 2015-09-25 DIAGNOSIS — N83201 Unspecified ovarian cyst, right side: Secondary | ICD-10-CM | POA: Diagnosis present

## 2015-09-25 DIAGNOSIS — M359 Systemic involvement of connective tissue, unspecified: Secondary | ICD-10-CM

## 2015-09-25 MED ORDER — HYDROCODONE-HOMATROPINE 5-1.5 MG/5ML PO SYRP: 5.0000 mL | ORAL_SOLUTION | Freq: Four times a day (QID) | ORAL | Status: DC | PRN

## 2015-09-25 NOTE — Assessment & Plan Note (Signed)
Cont on O2 at 4L/M

## 2015-09-25 NOTE — Assessment & Plan Note (Signed)
Cont on cellcept and pred  [facility] this [facility]

## 2015-09-25 NOTE — Assessment & Plan Note (Signed)
Suspected flare with cough  Check cxr  Brief steroid burst  Plan  Increase Prednisone 40mg  daily for 2 weeks then 30mg  daily for 1 week then 20mg  daily  Continue on Cellcept.  Chest xray today .  [facility]  follow up Dr. Marchelle Gearingamaswamy next month as planned and As needed   Please contact office for sooner follow up if symptoms do not improve or worsen or seek emergency care

## 2015-09-25 NOTE — Patient Instructions (Signed)
Increase Prednisone 40mg  daily for 2 weeks then 30mg  daily for 1 week then 20mg  daily  Continue on Cellcept.  Chest xray today .  [facility]  follow up Dr. Marchelle Gearingamaswamy next month as planned and As needed   Please contact office for sooner follow up if symptoms do not improve or worsen or seek emergency care

## 2015-09-25 NOTE — Progress Notes (Signed)
Subjective:    [name], female    DOB: 1991-03-06, 24 y.o.   MRN: [id number]  HPI 24 yo female with   TEST  ILD with autoimmune features - . On high dose empoiric prednisone Feb 2016 - Bronch TBBx - "beneign lung tissue" ECHO 02/10/15 - no pulm htn April 2016 - ANA 1:160, Positive , ACE84, A SSA/SSB > 8, RNP, ANCA, ENA, DS-DNA, JO1- negatie Duke repeated autpimmune and ANA - positive 1:2560 (much higher than with us in April 2016), ANCA positive but only mild MPO is 21, RF - positive 29, Anti-RO/Anti-LA positive .  She had PFT - FVC 1.57L/42%, DLCO 6.4/28% - severe ILD bronchoscopy 05/14/15 - Clear fluids with Lymphs 23%, Mac 22% and down. , PMN 11% and up.  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  Complains on increased dry cough and DOE for last 1 week .  She denies chest pain, orthopnea, edema or fever. No discolored mucus.  Remains on O2 4 l/m . No change in oxygen demands.  Requests cough syrup at At bedtime  To help with cough , disrupts sleep.  Advised on sleepiness.   Pt is following with [facility] , Dr. Gaynell FaceMarshall. She is being treated for undifferentiated connective tissue disease with features of SLE and Sjogrens . She is now on cellcept  And prednisone 20mg  daily .  (recently off cellcept due to pregnancy but had miscarriage) .   Seen 09/09/15 by cardiology for evaluation of pulmonary HTN .  RHC was neg for Pulmonary HTN w/  PAP 26/7 .     Review of Systems Constitutional:   No  weight loss, night sweats,  Fevers, chills,  +fatigue, or  lassitude.  HEENT:   No headaches,  Difficulty swallowing,  Tooth/dental problems, or  Sore throat,                No sneezing, itching, ear ache, nasal congestion, post nasal drip,   CV:  No chest pain,  Orthopnea, PND, swelling in lower extremities, anasarca, dizziness, palpitations, syncope.   GI  No  heartburn, indigestion, abdominal pain, nausea, vomiting, diarrhea, change in bowel habits, loss of appetite, bloody stools.   Resp: .  No chest wall deformity  Skin: no rash or lesions.  GU: no dysuria, change in color of urine, no urgency or frequency.  No flank pain, no hematuria   MS:  No joint pain or swelling.  No decreased range of motion.  No back pain.  Psych:  No change in mood or affect. No depression or anxiety.  No memory loss.         Objective:   Physical Exam  GEN: A/Ox3; pleasant , NAD, chronically ill appearing  VS reviewed   HEENT:  [address]/AT,  EACs-clear, TMs-wnl, NOSE-clear, THROAT-clear, no lesions, no postnasal drip or exudate noted.   NECK:  Supple w/ fair ROM; no JVD; normal carotid impulses w/o bruits; no thyromegaly or nodules palpated; no lymphadenopathy.  RESP  BB crackles no accessory muscle use, no dullness to percussion  CARD:  RRR, no m/r/g  , no peripheral edema, pulses intact, no cyanosis or clubbing.  GI:   Soft & nt; nml bowel sounds; no organomegaly or masses detected.  Musco: Warm bil, no deformities or joint swelling noted.   Neuro: alert, no focal deficits noted.    Skin: Warm, no lesions or rashes  Assessment & Plan:

## 2015-09-26 ENCOUNTER — Telehealth (HOSPITAL_COMMUNITY): Payer: Self-pay

## 2015-09-26 NOTE — Telephone Encounter (Signed)
Unable to contact pt by mail or telephone. Unable to communicate lab results or treatment changes. 

## 2015-09-29 NOTE — Progress Notes (Signed)
Quick Note:  Called and spoke with the pt. Reviewed results and recs. Pt states she symptoms have improved. Pt voiced understanding and had no further questions. ______

## 2015-09-29 NOTE — Progress Notes (Signed)
Quick Note:  LVM for pt to return call ______ 

## 2015-11-04 ENCOUNTER — Emergency Department (HOSPITAL_COMMUNITY): Payer: No Typology Code available for payment source

## 2015-11-04 ENCOUNTER — Encounter (HOSPITAL_COMMUNITY): Payer: Self-pay | Admitting: Cardiology

## 2015-11-04 ENCOUNTER — Emergency Department (HOSPITAL_COMMUNITY)
Admission: EM | Admit: 2015-11-04 | Discharge: 2015-11-04 | Disposition: A | Discharge status: Home / Self Care | Payer: No Typology Code available for payment source | Attending: Emergency Medicine | Admitting: Emergency Medicine

## 2015-11-04 DIAGNOSIS — Z8701 Personal history of pneumonia (recurrent): Secondary | ICD-10-CM | POA: Insufficient documentation

## 2015-11-04 DIAGNOSIS — M1990 Unspecified osteoarthritis, unspecified site: Secondary | ICD-10-CM | POA: Insufficient documentation

## 2015-11-04 DIAGNOSIS — J069 Acute upper respiratory infection, unspecified: Secondary | ICD-10-CM | POA: Insufficient documentation

## 2015-11-04 DIAGNOSIS — Z79899 Other long term (current) drug therapy: Secondary | ICD-10-CM | POA: Insufficient documentation

## 2015-11-04 DIAGNOSIS — R0981 Nasal congestion: Secondary | ICD-10-CM | POA: Diagnosis present

## 2015-11-04 DIAGNOSIS — Z87891 Personal history of nicotine dependence: Secondary | ICD-10-CM | POA: Insufficient documentation

## 2015-11-04 DIAGNOSIS — Z7952 Long term (current) use of systemic steroids: Secondary | ICD-10-CM | POA: Diagnosis not present

## 2015-11-04 DIAGNOSIS — K219 Gastro-esophageal reflux disease without esophagitis: Secondary | ICD-10-CM | POA: Diagnosis not present

## 2015-11-04 DIAGNOSIS — Z9889 Other specified postprocedural states: Secondary | ICD-10-CM | POA: Diagnosis not present

## 2015-11-04 MED ORDER — KETOROLAC TROMETHAMINE 60 MG/2ML IM SOLN
60.0000 mg | Freq: Once | INTRAMUSCULAR | Status: AC
  Administered 2015-11-04: 60 mg via INTRAMUSCULAR
  Filled 2015-11-04: qty 2

## 2015-11-04 MED ORDER — FLUTICASONE PROPIONATE 50 MCG/ACT NA SUSP: 2.0000 | Freq: Every day | NASAL | Status: DC

## 2015-11-04 MED ORDER — HYDROCODONE-HOMATROPINE 5-1.5 MG/5ML PO SYRP: 5.0000 mL | ORAL_SOLUTION | Freq: Four times a day (QID) | ORAL | Status: DC | PRN

## 2015-11-04 NOTE — ED Notes (Signed)
EDP at the bedside.  ?

## 2015-11-04 NOTE — ED Notes (Signed)
Pt reports congestion, cough and headache for about 3 days.

## 2015-11-04 NOTE — ED Provider Notes (Signed)
CSN: [id number]     Arrival date & time 11/04/15  0804 History   First MD Initiated Contact with Patient 11/04/15 [phone]     Chief Complaint  Patient presents with  . Nasal Congestion  . Headache     (Consider location/radiation/quality/duration/timing/severity/associated sxs/prior Treatment) HPI  24 year old female presents with cough and congestion for the past 3 days. Patient has been having chest tightness during this time as well. Cough has some mild clear sputum. No fevers. No shortness of breath. Patient states that her nose feels swollen. She took Alka-Seltzer one time but otherwise has not taken any new meds. Patient also is complaining of a two-week history of a gradually worsening migraine. She states she was diagnosed as a migraine although she's not sure. She has a reported history of a daily headache in her chart. Describes as a frontal squeezing sensation. No blurry vision, weakness, or numbness. No neck pain or stiffness. No vomiting.   Past Medical History  Diagnosis Date  . CAP (community acquired pneumonia) 01/07/2015  . GERD (gastroesophageal reflux disease)   . Daily headache     "sometimes" (01/08/2015)  . Arthritis     "hands and legs" (01/08/2015)  . Lupus (HCC)   . Lung disease    Past Surgical History  Procedure Laterality Date  . Finger surgery Right 03/2014    "laceration, nerve/artery injury" 2nd digit  . Video bronchoscopy Bilateral 01/12/2015    Procedure: VIDEO BRONCHOSCOPY WITH FLUORO;  Surgeon: Leslye Peer, MD;  Location: [facility] ENDOSCOPY;  Service: Cardiopulmonary;  Laterality: Bilateral;  . Dilation and evacuation N/A 07/13/2015    Procedure: DILATATION AND EVACUATION;  Surgeon: Levie Heritage, DO;  Location: [facility] ORS;  Service: Gynecology;  Laterality: N/A;  . Cardiac catheterization N/A 09/09/2015    Procedure: Right Heart Cath;  Surgeon: Laurey Morale, MD;  Location: [facility] INVASIVE CV LAB;  Service: Cardiovascular;  Laterality: N/A;   Family History   Problem Relation Age of Onset  . Diabetes Mother   . Diabetes Maternal Aunt   . Diabetes Maternal Grandmother    Social History  Substance Use Topics  . Smoking status: Former Smoker -- 0.10 packs/day for 5 years    Types: Cigarettes    Quit date: 11/20/2014  . Smokeless tobacco: Never Used  . Alcohol Use: No     Comment: 01/08/2015 "last drink was New Year's Eve"   OB History    Gravida Para Term Preterm AB TAB SAB Ectopic Multiple Living       Review of Systems  Constitutional: Negative for fever.  HENT: Positive for congestion and ear pain (right). Negative for sore throat.   Respiratory: Positive for cough and chest tightness. Negative for shortness of breath.   Cardiovascular: Negative for leg swelling.  Gastrointestinal: Negative for vomiting and abdominal pain.  Neurological: Positive for headaches. Negative for weakness and numbness.  All other systems reviewed and are negative.     Allergies  Zithromax  Home Medications   Prior to Admission medications   Medication Sig Start Date End Date Taking? Authorizing Provider  desonide (DESOWEN) 0.05 % ointment Apply 1 application topically 2 (two) times daily.  06/23/15   Historical Provider, MD  diphenhydrAMINE (BENADRYL) 25 mg capsule Take 25 mg by mouth every 8 (eight) hours as needed for itching or allergies.  01/15/15   Historical Provider, MD  fluocinonide ointment (LIDEX) 0.05 % Apply 1 application topically 2 (two)  times daily.  06/23/15   Historical Provider, MD  HYDROcodone-acetaminophen (NORCO) 5-325 MG per tablet Take 1 tablet by mouth every 6 (six) hours as needed for severe pain. Patient not taking: Reported on 09/07/2015 08/14/15   Mercedes Camprubi-Soms, PA-C  HYDROcodone-homatropine (HYCODAN) 5-1.5 MG/5ML syrup Take 5 mLs by mouth every 6 (six) hours as needed for cough. 09/25/15   Tammy S Parrett, NP  HYDROcodone-homatropine (HYDROMET) 5-1.5 MG/5ML syrup Take 5 mLs by mouth every 6 (six)  hours as needed. 09/25/15   Tammy S Parrett, NP  mycophenolate (CELLCEPT) 500 MG tablet Take 500 mg by mouth 2 (two) times daily.    Historical Provider, MD  naproxen (NAPROSYN) 500 MG tablet Take 1 tablet (500 mg total) by mouth 2 (two) times daily as needed for mild pain, moderate pain or headache (TAKE WITH MEALS.). 08/14/15   Mercedes Camprubi-Soms, PA-C  pantoprazole (PROTONIX) 40 MG tablet Take 40 mg by mouth daily.  08/01/15   Historical Provider, MD  predniSONE (DELTASONE) 20 MG tablet Take 20 mg by mouth daily with breakfast.    Historical Provider, MD   BP 143/90 mmHg  Pulse 95  Temp(Src) 98.8 F (37.1 C) (Oral)  Resp 18  Ht  (1.6 m)  Wt 173 lb (78.472 kg)  BMI 30.65 kg/m2  SpO2 99%  LMP 10/30/2015 Physical Exam  Constitutional: She is oriented to person, place, and time. She appears well-developed and well-nourished.  HENT:  Head: Normocephalic and atraumatic.  Right Ear: External ear normal.  Left Ear: External ear normal.  Nose: Mucosal edema (swollen turbinates) present.  Mouth/Throat: Oropharynx is clear and moist. No oropharyngeal exudate.  Eyes: EOM are normal. Pupils are equal, round, and reactive to light. Right eye exhibits no discharge. Left eye exhibits no discharge.  Neck: Normal range of motion. Neck supple.  Cardiovascular: Normal rate, regular rhythm and normal heart sounds.   Pulmonary/Chest: Effort normal. No accessory muscle usage. No tachypnea. No respiratory distress. She has rales.  Bibasilar rales  Abdominal: Soft. There is no tenderness.  Neurological: She is alert and oriented to person, place, and time.  CN 2-12 grossly intact. 5/5 strength in all 4 extremities.  Skin: Skin is warm and dry.  Nursing note and vitals reviewed.   ED Course  Procedures (including critical care time) Labs Review Labs Reviewed - No data to display  Imaging Review Dg Chest 2 View  11/04/2015  CLINICAL DATA:  24 year old female with nonproductive cough for 3  weeks. Initial encounter. EXAM: CHEST  2 VIEW COMPARISON:  09/25/2015 and earlier. FINDINGS: Chronic lung disease, most recently best described by CT on 05/11/2015. Peripheral upper lobe cystic change with perihilar architectural distortion. Lung volumes are stable since November. No pneumothorax, pulmonary edema, pleural effusion, or acute pulmonary opacity identified. Stable cardiac size and mediastinal contours. Visualized tracheal air column is within normal limits. No acute osseous abnormality identified. IMPRESSION: 1. Chronic interstitial lung disease, most recently described by CT on 05/11/2015. 2.  No superimposed acute findings are identified. Electronically Signed   By: Odessa Fleming M.D.   On: 11/04/2015 09:12   I have personally reviewed and evaluated these images and lab results as part of my medical decision-making.   EKG Interpretation   Date/Time:  Wednesday November 04 2015 09:34:15 EST Ventricular Rate:  86 PR Interval:  147 QRS Duration: 89 QT Interval:  345 QTC Calculation: 413 R Axis:   49 Text Interpretation:  Sinus rhythm Probable left atrial enlargement RSR'  in V1  or V2, right VCD or RVH Abnormal T, consider ischemia, diffuse leads  No significant change since Sept 2016 Confirmed by Lisanne Ponce  MD, Jarren Para  ([id number]) on 11/04/2015 9:37:18 AM      MDM   Final diagnoses:  Upper respiratory infection    Patient's cough and congestion is either likely a viral URI versus allergic. There is no shortness of breath or fevers. She is on chronic steroids but I have low suspicion for a bacterial pneumonia. Low suspicion for influenza given no other concomitant symptoms. Headache does not do so seem related and she has had migraine symptoms for quite some time. Normal neuro exam. No indication for CT of head. Will treat her cough and congestion symptomatically and recommended she follow-up with her pulmonologist. Her chest symptoms are more likely related to the cough and her EKG is  unchanged.    Pricilla LovelessScott Devita Nies, MD 11/04/15 [phone]

## 2015-11-04 NOTE — Discharge Instructions (Signed)
Cough, Adult °Coughing is a reflex that clears your throat and your airways. Coughing helps to heal and protect your lungs. It is normal to cough occasionally, but a cough that happens with other symptoms or lasts a long time may be a sign of a condition that needs treatment. A cough may last only 2-3 weeks (acute), or it may last longer than 8 weeks (chronic). °CAUSES °Coughing is commonly caused by: °· Breathing in substances that irritate your lungs. °· A viral or bacterial respiratory infection. °· Allergies. °· Asthma. °· Postnasal drip. °· Smoking. °· Acid backing up from the stomach into the esophagus (gastroesophageal reflux). °· Certain medicines. °· Chronic lung problems, including COPD (or rarely, lung cancer). °· Other medical conditions such as heart failure. °HOME CARE INSTRUCTIONS  °Pay attention to any changes in your symptoms. Take these actions to help with your discomfort: °· Take medicines only as told by your health care provider. °· If you were prescribed an antibiotic medicine, take it as told by your health care provider. Do not stop taking the antibiotic even if you start to feel better. °· Talk with your health care provider before you take a cough suppressant medicine. °· Drink enough fluid to keep your urine clear or pale yellow. °· If the air is dry, use a cold steam vaporizer or humidifier in your bedroom or your home to help loosen secretions. °· Avoid anything that causes you to cough at work or at home. °· If your cough is worse at night, try sleeping in a semi-upright position. °· Avoid cigarette smoke. If you smoke, quit smoking. If you need help quitting, ask your health care provider. °· Avoid caffeine. °· Avoid alcohol. °· Rest as needed. °SEEK MEDICAL CARE IF:  °· You have new symptoms. °· You cough up pus. °· Your cough does not get better after 2-3 weeks, or your cough gets worse. °· You cannot control your cough with suppressant medicines and you are losing sleep. °· You  develop pain that is getting worse or pain that is not controlled with pain medicines. °· You have a fever. °· You have unexplained weight loss. °· You have night sweats. °SEEK IMMEDIATE MEDICAL CARE IF: °· You cough up blood. °· You have difficulty breathing. °· Your heartbeat is very fast. °  °This information is not intended to replace advice given to you by your health care provider. Make sure you discuss any questions you have with your health care provider. °  °Document Released: 05/06/2011 Document Revised: 07/29/2015 Document Reviewed: 01/14/2015 °Elsevier Interactive Patient Education ©2016 [employer] ° °Upper Respiratory Infection, Adult °Most upper respiratory infections (URIs) are a viral infection of the air passages leading to the lungs. A URI affects the nose, throat, and upper air passages. The most common type of URI is nasopharyngitis and is typically referred to as "the common cold." °URIs run their course and usually go away on their own. Most of the time, a URI does not require medical attention, but sometimes a bacterial infection in the upper airways can follow a viral infection. This is called a secondary infection. Sinus and middle ear infections are common types of secondary upper respiratory infections. °Bacterial pneumonia can also complicate a URI. A URI can worsen asthma and chronic obstructive pulmonary disease (COPD). Sometimes, these complications can require emergency medical care and may be life threatening.  °CAUSES °Almost all URIs are caused by viruses. A virus is a type of germ and can spread from one   person to another.  °RISKS FACTORS °You may be at risk for a URI if:  °· You smoke.   °· You have chronic heart or lung disease. °· You have a weakened defense (immune) system.   °· You are very young or very old.   °· You have nasal allergies or asthma. °· You work in crowded or poorly ventilated areas. °· You work in health care facilities or schools. °SIGNS AND SYMPTOMS    °Symptoms typically develop 2-3 days after you come in contact with a cold virus. Most viral URIs last 7-10 days. However, viral URIs from the influenza virus (flu virus) can last 14-18 days and are typically more severe. Symptoms may include:  °· Runny or stuffy (congested) nose.   °· Sneezing.   °· Cough.   °· Sore throat.   °· Headache.   °· Fatigue.   °· Fever.   °· Loss of appetite.   °· Pain in your forehead, behind your eyes, and over your cheekbones (sinus pain). °· Muscle aches.   °DIAGNOSIS  °Your health care provider may diagnose a URI by: °· Physical exam. °· Tests to check that your symptoms are not due to another condition such as: °¨ Strep throat. °¨ Sinusitis. °¨ Pneumonia. °¨ Asthma. °TREATMENT  °A URI goes away on its own with time. It cannot be cured with medicines, but medicines may be prescribed or recommended to relieve symptoms. Medicines may help: °· Reduce your fever. °· Reduce your cough. °· Relieve nasal congestion. °HOME CARE INSTRUCTIONS  °· Take medicines only as directed by your health care provider.   °· Gargle warm saltwater or take cough drops to comfort your throat as directed by your health care provider. °· Use a warm mist humidifier or inhale steam from a shower to increase air moisture. This may make it easier to breathe. °· Drink enough fluid to keep your urine clear or pale yellow.   °· Eat soups and other clear broths and maintain good nutrition.   °· Rest as needed.   °· Return to work when your temperature has returned to normal or as your health care provider advises. You may need to stay home longer to avoid infecting others. You can also use a face mask and careful hand washing to prevent spread of the virus. °· Increase the usage of your inhaler if you have asthma.   °· Do not use any tobacco products, including cigarettes, chewing tobacco, or electronic cigarettes. If you need help quitting, ask your health care provider. °PREVENTION  °The best way to protect  yourself from getting a cold is to practice good hygiene.  °· Avoid oral or hand contact with people with cold symptoms.   °· Wash your hands often if contact occurs.   °There is no clear evidence that vitamin C, vitamin E, echinacea, or exercise reduces the chance of developing a cold. However, it is always recommended to get plenty of rest, exercise, and practice good nutrition.  °SEEK MEDICAL CARE IF:  °· You are getting worse rather than better.   °· Your symptoms are not controlled by medicine.   °· You have chills. °· You have worsening shortness of breath. °· You have brown or red mucus. °· You have yellow or brown nasal discharge. °· You have pain in your face, especially when you bend forward. °· You have a fever. °· You have swollen neck glands. °· You have pain while swallowing. °· You have white areas in the back of your throat. °SEEK IMMEDIATE MEDICAL CARE IF:  °· You have severe or persistent: °¨ Headache. °¨ Ear pain. °¨   Sinus pain. °¨ Chest pain. °· You have chronic lung disease and any of the following: °¨ Wheezing. °¨ Prolonged cough. °¨ Coughing up blood. °¨ A change in your usual mucus. °· You have a stiff neck. °· You have changes in your: °¨ Vision. °¨ Hearing. °¨ Thinking. °¨ Mood. °MAKE SURE YOU:  °· Understand these instructions. °· Will watch your condition. °· Will get help right away if you are not doing well or get worse. °  °This information is not intended to replace advice given to you by your health care provider. Make sure you discuss any questions you have with your health care provider. °  °Document Released: 05/03/2001 Document Revised: 03/24/2015 Document Reviewed: 02/12/2014 °Elsevier Interactive Patient Education ©2016 [employer] Inc. ° °

## 2015-11-05 ENCOUNTER — Telehealth: Payer: Self-pay | Admitting: Internal Medicine

## 2015-11-05 NOTE — Telephone Encounter (Signed)
Pt. Called requesting to speak to PCP requesting an antibiotic for a sinus infection.Please f/u with pt.

## 2015-11-06 ENCOUNTER — Encounter (HOSPITAL_COMMUNITY): Payer: Self-pay | Admitting: *Deleted

## 2015-11-06 ENCOUNTER — Emergency Department (HOSPITAL_COMMUNITY)
Admission: EM | Admit: 2015-11-06 | Discharge: 2015-11-06 | Disposition: A | Discharge status: Home / Self Care | Payer: No Typology Code available for payment source | Attending: Emergency Medicine | Admitting: Emergency Medicine

## 2015-11-06 DIAGNOSIS — Z79899 Other long term (current) drug therapy: Secondary | ICD-10-CM | POA: Diagnosis not present

## 2015-11-06 DIAGNOSIS — Z8701 Personal history of pneumonia (recurrent): Secondary | ICD-10-CM | POA: Diagnosis not present

## 2015-11-06 DIAGNOSIS — J340 Abscess, furuncle and carbuncle of nose: Secondary | ICD-10-CM | POA: Diagnosis not present

## 2015-11-06 DIAGNOSIS — Z87891 Personal history of nicotine dependence: Secondary | ICD-10-CM | POA: Insufficient documentation

## 2015-11-06 DIAGNOSIS — M1990 Unspecified osteoarthritis, unspecified site: Secondary | ICD-10-CM | POA: Insufficient documentation

## 2015-11-06 DIAGNOSIS — R51 Headache: Secondary | ICD-10-CM | POA: Diagnosis present

## 2015-11-06 DIAGNOSIS — K120 Recurrent oral aphthae: Secondary | ICD-10-CM | POA: Diagnosis not present

## 2015-11-06 MED ORDER — CLINDAMYCIN HCL 300 MG PO CAPS: 300.0000 mg | ORAL_CAPSULE | Freq: Three times a day (TID) | ORAL | Status: DC

## 2015-11-06 MED ORDER — HYDROCODONE-ACETAMINOPHEN 5-325 MG PO TABS: 1.0000 | ORAL_TABLET | Freq: Four times a day (QID) | ORAL | Status: DC | PRN

## 2015-11-06 NOTE — Telephone Encounter (Signed)
Nurse called patient, patient verified date of birth. Patient reports nostrils are swollen and has pressure in nose and under eyes, and headache.  Nurse explained to patient, patient must be seen to get antibiotic and provider has no appointments available. Nurse requested patient to go to Urgent Care. [facility]. Nurse requested patient to go to Urgent Care if patient feels she can not wait until next Thursday. Nurse [facility] and daily starting Monday to check for cancellations to be seen sooner.  Patient voices understanding and has no further questions at this time.

## 2015-11-06 NOTE — Discharge Instructions (Signed)
Cellulitis Cellulitis is an infection of the skin and the tissue under the skin. The infected area is usually red and tender. This happens most often in the arms and lower legs. HOME CARE   Take your antibiotic medicine as told. Finish the medicine even if you start to feel better.  Keep the infected arm or leg raised (elevated).  Put a warm cloth on the area up to 4 times per day.  Only take medicines as told by your doctor.  Keep all doctor visits as told. GET HELP IF:  You see red streaks on the skin coming from the infected area.  Your red area gets bigger or turns a dark color.  Your bone or joint under the infected area is painful after the skin heals.  Your infection comes back in the same area or different area.  You have a puffy (swollen) bump in the infected area.  You have new symptoms.  You have a fever. GET HELP RIGHT AWAY IF:   You feel very sleepy.  You throw up (vomit) or have watery poop (diarrhea).  You feel sick and have muscle aches and pains.   This information is not intended to replace advice given to you by your health care provider. Make sure you discuss any questions you have with your health care provider.   Document Released: 04/25/2008 Document Revised: 07/29/2015 Document Reviewed: 01/23/2012 Elsevier Interactive Patient Education 2016 Elsevier Inc.  Canker Sores Canker sores are small, painful sores that develop inside your mouth. They may also be called aphthous ulcers. You can get canker sores on the inside of your lips or cheeks, on your tongue, or anywhere inside your mouth. You can have just one canker sore or several of them. Canker sores cannot be passed from one person to another (noncontagious). These sores are different than the sores that you may get on the outside of your lips (cold sores or fever blisters). Canker sores usually start as painful red bumps. Then they turn into small white, yellow, or gray ulcers that have red  borders. The ulcers may be quite painful. The pain may be worse when you eat or drink. CAUSES The cause of this condition is not known. RISK FACTORS This condition is more likely to develop in:  Women.  People in their teens or 33s.  Women who are having their menstrual period.  People who are under a lot of emotional stress.  People who do not get enough iron or B vitamins.  People who have poor oral hygiene.  People who have an injury inside the mouth. This can happen after having dental work or from chewing something hard. SYMPTOMS Along with the canker sore, symptoms may also include:  Fever.  Fatigue.  Swollen lymph nodes in your neck. DIAGNOSIS This condition can be diagnosed based on your symptoms. Your health care provider will also examine your mouth. Your health care provider may also do tests if you get canker sores often or if they are very bad. Tests may include:  Blood tests to rule out other causes of canker sores.  Taking swabs from the sore to check for infection.  Taking a small piece of skin from the sore (biopsy) to test it for cancer. TREATMENT Most canker sores clear up without treatment in about 10 days. Home care is usually the only treatment that you will need. Over-the-counter medicines can relieve discomfort.If you have severe canker sores, your health care provider may prescribe:  Numbing ointment to relieve  pain.  Vitamins.  Steroid medicines. These may be given as:  Oral pills.  Mouth rinses.  Gels.  Antibiotic mouth rinse. HOME CARE INSTRUCTIONS  Apply, take, or use medicines only as directed by your health care provider. These include vitamins.  If you were prescribed an antibiotic mouth rinse, finish all of it even if you start to feel better.  Until the sores are healed:  Do not drink coffee or citrus juices.  Do not eat spicy or salty foods.  Use a mild, over-the-counter mouth rinse as directed by your health care  provider.  Practice good oral hygiene.  Floss your teeth every day.  Brush your teeth with a soft brush twice each day. SEEK MEDICAL CARE IF:  Your symptoms do not get better after two weeks.  You also have a fever or swollen glands.  You get canker sores often.  You have a canker sore that is getting larger.  You cannot eat or drink due to your canker sores.   This information is not intended to replace advice given to you by your health care provider. Make sure you discuss any questions you have with your health care provider.   Document Released: 03/04/2011 Document Revised: 03/24/2015 Document Reviewed: 10/08/2014 Elsevier Interactive Patient Education [employer]2016 Elsevier Inc.

## 2015-11-06 NOTE — ED Provider Notes (Signed)
CSN: [id number]     Arrival date & time 11/06/15  1438 History   By signing my name below, I, Essence Howell, attest that this documentation has been prepared under the direction and in the presence of Teressa Lower, NP Electronically Signed: Charline Bills, ED Scribe 11/06/2015 at 3:09 PM.   Chief Complaint  Patient presents with  . Facial Pain   The history is provided by the patient. No language interpreter was used.  HPI Comments: Kayla [name] is a 24 y.o. female, with a h/o lupus, who presents to the Emergency Department complaining of gradually worsening, constant sinus pressure over the past few days. Pt was seen in the ED 2 days ago for cough and congestion; discharged with  She reports persistent nasal congestion, a mouth lesion to the upper lip and nasal swelling x3 days. She denies fever and cough. Allergy to Zithromax.   Past Medical History  Diagnosis Date  . CAP (community acquired pneumonia) 01/07/2015  . GERD (gastroesophageal reflux disease)   . Daily headache     "sometimes" (01/08/2015)  . Arthritis     "hands and legs" (01/08/2015)  . Lupus (HCC)   . Lung disease    Past Surgical History  Procedure Laterality Date  . Finger surgery Right 03/2014    "laceration, nerve/artery injury" 2nd digit  . Video bronchoscopy Bilateral 01/12/2015    Procedure: VIDEO BRONCHOSCOPY WITH FLUORO;  Surgeon: Leslye Peer, MD;  Location[facility] ENDOSCOPY;  Service: Cardiopulmonary;  Laterality: Bilateral;  . Dilation and evacuation N/A 07/13/2015    Procedure: DILATATION AND EVACUATION;  Surgeon: Levie Heritage, DO;  Location: [facility] ORS;  Service: Gynecology;  Laterality: N/A;  . Cardiac catheterization N/A 09/09/2015    Procedure: Right Heart Cath;  Surgeon: Laurey Morale, MD;  Location: [facility] INVASIVE CV LAB;  Service: Cardiovascular;  Laterality: N/A;   Family History  Problem Relation Age of Onset  . Diabetes Mother   . Diabetes Maternal Aunt   . Diabetes Maternal Grandmother     Social History  Substance Use Topics  . Smoking status: Former Smoker -- 0.10 packs/day for 5 years    Types: Cigarettes    Quit date: 11/20/2014  . Smokeless tobacco: Never Used  . Alcohol Use: No     Comment: 01/08/2015 "last drink was New Year's Eve"   OB History    Gravida Para Term Preterm AB TAB SAB Ectopic Multiple Living       Review of Systems  Constitutional: Negative for fever.  HENT: Positive for congestion, facial swelling and mouth sores.   Respiratory: Negative for cough.    Allergies  Zithromax  Home Medications   Prior to Admission medications   Medication Sig Start Date End Date Taking? Authorizing Provider  desonide (DESOWEN) 0.05 % ointment Apply 1 application topically 2 (two) times daily.  06/23/15   Historical Provider, MD  diphenhydrAMINE (BENADRYL) 25 mg capsule Take 25 mg by mouth every 8 (eight) hours as needed for itching or allergies.  01/15/15   Historical Provider, MD  fluocinonide ointment (LIDEX) 0.05 % Apply 1 application topically 2 (two) times daily.  06/23/15   Historical Provider, MD  fluticasone (FLONASE) 50 MCG/ACT nasal spray Place 2 sprays into both nostrils daily. 11/04/15   Pricilla Loveless, MD  HYDROcodone-homatropine [facility]) 5-1.5 MG/5ML syrup Take 5 mLs by mouth every 6 (six) hours as needed for cough. 11/04/15   Pricilla Loveless, MD  mycophenolate (  CELLCEPT) 500 MG tablet Take 500 mg by mouth 2 (two) times daily.    Historical Provider, MD  naproxen (NAPROSYN) 500 MG tablet Take 1 tablet (500 mg total) by mouth 2 (two) times daily as needed for mild pain, moderate pain or headache (TAKE WITH MEALS.). 08/14/15   Mercedes Camprubi-Soms, PA-C  pantoprazole (PROTONIX) 40 MG tablet Take 40 mg by mouth daily.  08/01/15   Historical Provider, MD  predniSONE (DELTASONE) 20 MG tablet Take 40 mg by mouth daily with breakfast.     Historical Provider, MD   BP 146/98 mmHg  Pulse 98  Temp(Src) 99.1 F (37.3 C) (Oral)  Resp 18   Ht 5\' 3"  (1.6 m)  Wt 172 lb (78.019 kg)  BMI 30.48 kg/m2  SpO2 96%  LMP 10/30/2015 Physical Exam  Constitutional: She is oriented to person, place, and time. She appears well-developed and well-nourished. No distress.  HENT:  Head: Normocephalic and atraumatic.  Right Ear: External ear normal.  Left Ear: External ear normal.  White sore noted to the upper lip.small open sore the medial left nostril. Redness noted to the bottom of the nose and to the tip. Ttp. No drainage  Eyes: Conjunctivae and EOM are normal.  Neck: Neck supple. No tracheal deviation present.  Cardiovascular: Normal rate.   Pulmonary/Chest: Effort normal. No respiratory distress.  Musculoskeletal: Normal range of motion.  Neurological: She is alert and oriented to person, place, and time.  Skin: Skin is warm and dry.  Psychiatric: She has a normal mood and affect. Her behavior is normal.  Nursing note and vitals reviewed.  ED Course  Procedures (including critical care time) DIAGNOSTIC STUDIES: Oxygen Saturation is 96% on [place], adequate by my interpretation.    COORDINATION OF CARE: 3:02 PM-Discussed treatment plan which includes Clindamycin with pt at bedside and pt agreed to plan.   Labs Review Labs Reviewed - No data to display  Imaging Review No results found. I have personally reviewed and evaluated these images and lab results as part of my medical decision-making.   EKG Interpretation None      MDM   Final diagnoses:  Cellulitis of nose, external  Canker sore    Told pt to stop flonase. Discussed strict precautions. No sign of abscess.sent home with clindamycin  I personally performed the services described in this documentation, which was scribed in my presence. The recorded information has been reviewed and is accurate.    Teressa LowerVrinda Leeasia Secrist, NP 11/06/15 1515  Loren Raceravid Yelverton, MD 11/10/15 [phone]

## 2015-11-06 NOTE — ED Notes (Signed)
Pt reports nasal congestion and drainage. Pt reports that her nose has become irritated and painful. Pt also reports a sore on her upper lip.

## 2015-11-11 ENCOUNTER — Ambulatory Visit: Payer: Self-pay | Admitting: Internal Medicine

## 2015-11-12 ENCOUNTER — Ambulatory Visit: Payer: Self-pay | Admitting: Internal Medicine

## 2015-12-06 ENCOUNTER — Encounter (HOSPITAL_COMMUNITY): Payer: Self-pay | Admitting: Emergency Medicine

## 2015-12-06 ENCOUNTER — Emergency Department (HOSPITAL_COMMUNITY)
Admission: EM | Admit: 2015-12-06 | Discharge: 2015-12-06 | Disposition: A | Discharge status: Home / Self Care | Payer: BLUE CROSS/BLUE SHIELD | Attending: Emergency Medicine | Admitting: Emergency Medicine

## 2015-12-06 DIAGNOSIS — B373 Candidiasis of vulva and vagina: Secondary | ICD-10-CM | POA: Insufficient documentation

## 2015-12-06 DIAGNOSIS — Z8701 Personal history of pneumonia (recurrent): Secondary | ICD-10-CM | POA: Insufficient documentation

## 2015-12-06 DIAGNOSIS — Z792 Long term (current) use of antibiotics: Secondary | ICD-10-CM | POA: Diagnosis not present

## 2015-12-06 DIAGNOSIS — Z7952 Long term (current) use of systemic steroids: Secondary | ICD-10-CM | POA: Insufficient documentation

## 2015-12-06 DIAGNOSIS — Z87891 Personal history of nicotine dependence: Secondary | ICD-10-CM | POA: Insufficient documentation

## 2015-12-06 DIAGNOSIS — N898 Other specified noninflammatory disorders of vagina: Secondary | ICD-10-CM | POA: Diagnosis present

## 2015-12-06 DIAGNOSIS — Z9889 Other specified postprocedural states: Secondary | ICD-10-CM | POA: Insufficient documentation

## 2015-12-06 DIAGNOSIS — B3731 Acute candidiasis of vulva and vagina: Secondary | ICD-10-CM

## 2015-12-06 DIAGNOSIS — Z8709 Personal history of other diseases of the respiratory system: Secondary | ICD-10-CM | POA: Insufficient documentation

## 2015-12-06 DIAGNOSIS — Z79899 Other long term (current) drug therapy: Secondary | ICD-10-CM | POA: Diagnosis not present

## 2015-12-06 DIAGNOSIS — Z3202 Encounter for pregnancy test, result negative: Secondary | ICD-10-CM | POA: Diagnosis not present

## 2015-12-06 DIAGNOSIS — M19041 Primary osteoarthritis, right hand: Secondary | ICD-10-CM | POA: Insufficient documentation

## 2015-12-06 DIAGNOSIS — Z7951 Long term (current) use of inhaled steroids: Secondary | ICD-10-CM | POA: Insufficient documentation

## 2015-12-06 DIAGNOSIS — M19042 Primary osteoarthritis, left hand: Secondary | ICD-10-CM | POA: Diagnosis not present

## 2015-12-06 DIAGNOSIS — K219 Gastro-esophageal reflux disease without esophagitis: Secondary | ICD-10-CM | POA: Insufficient documentation

## 2015-12-06 LAB — URINE MICROSCOPIC-ADD ON

## 2015-12-06 LAB — URINALYSIS, ROUTINE W REFLEX MICROSCOPIC
Bilirubin Urine: NEGATIVE
Glucose, UA: NEGATIVE mg/dL
KETONES UR: NEGATIVE mg/dL
Nitrite: NEGATIVE
PROTEIN: NEGATIVE mg/dL
Specific Gravity, Urine: 1.022 (ref 1.005–1.030)
pH: 7 (ref 5.0–8.0)

## 2015-12-06 LAB — WET PREP, GENITAL
Sperm: NONE SEEN
Trich, Wet Prep: NONE SEEN

## 2015-12-06 LAB — POC URINE PREG, ED: PREG TEST UR: NEGATIVE

## 2015-12-06 MED ORDER — FLUCONAZOLE 100 MG PO TABS
200.0000 mg | ORAL_TABLET | Freq: Once | ORAL | Status: AC
Start: 2015-12-06 — End: 2015-12-06
  Administered 2015-12-06: 200 mg via ORAL
  Filled 2015-12-06: qty 2

## 2015-12-06 NOTE — ED Notes (Signed)
Pt from home for eval of white itching discharge x3 days ago. Pt denies any n/v/d or fevers at this time but reports recently finishing cipro antibiotic after UTI. Pt LMP in December 2016. nad noted.

## 2015-12-06 NOTE — ED Provider Notes (Signed)
CSN: [id number]     Arrival date & time 12/06/15  1615 History   First MD Initiated Contact with Patient 12/06/15 1619     Chief Complaint  Patient presents with  . Vaginal Discharge     (Consider location/radiation/quality/duration/timing/severity/associated sxs/prior Treatment) Patient is a 25 y.o. female presenting with vaginal discharge. The history is provided by the patient.  Vaginal Discharge Quality:  White Severity:  Moderate Onset quality:  Gradual Duration:  3 days Timing:  Constant Progression:  Worsening Chronicity:  New Context: spontaneously   Relieved by:  None tried Ineffective treatments:  None tried Associated symptoms: urinary frequency and vaginal itching   Risk factors: unprotected sex    Zeynab Klett is a 25 y.o. G1 P0, LMP first week in December. She uses no birth control. Current sex partner x 2 years. Hx of chlamydia 5 years ago. Last pap one year ago and was normal. Patient was on antibiotics for a UTI the end of December.   Past Medical History  Diagnosis Date  . CAP (community acquired pneumonia) 01/07/2015  . GERD (gastroesophageal reflux disease)   . Daily headache     "sometimes" (01/08/2015)  . Arthritis     "hands and legs" (01/08/2015)  . Lupus (HCC)   . Lung disease    Past Surgical History  Procedure Laterality Date  . Finger surgery Right 03/2014    "laceration, nerve/artery injury" 2nd digit  . Video bronchoscopy Bilateral 01/12/2015    Procedure: VIDEO BRONCHOSCOPY WITH FLUORO;  Surgeon: Leslye Peer, MD;  Location: [facility] ENDOSCOPY;  Service: Cardiopulmonary;  Laterality: Bilateral;  . Dilation and evacuation N/A 07/13/2015    Procedure: DILATATION AND EVACUATION;  Surgeon: Levie Heritage, DO;  Location: [facility] ORS;  Service: Gynecology;  Laterality: N/A;  . Cardiac catheterization N/A 09/09/2015    Procedure: Right Heart Cath;  Surgeon: Laurey Morale, MD;  Location: [facility] INVASIVE CV LAB;  Service: Cardiovascular;  Laterality: N/A;    Family History  Problem Relation Age of Onset  . Diabetes Mother   . Diabetes Maternal Aunt   . Diabetes Maternal Grandmother    Social History  Substance Use Topics  . Smoking status: Former Smoker -- 0.10 packs/day for 5 years    Types: Cigarettes    Quit date: 11/20/2014  . Smokeless tobacco: Never Used  . Alcohol Use: No     Comment: 01/08/2015 "last drink was New Year's Eve"   OB History    Gravida Para Term Preterm AB TAB SAB Ectopic Multiple Living   1 0 0 0 0 0 0 0 0 0      Review of Systems  Genitourinary: Positive for vaginal discharge.  all other systems negative    Allergies  Zithromax  Home Medications   Prior to Admission medications   Medication Sig Start Date End Date Taking? Authorizing Provider  clindamycin (CLEOCIN) 300 MG capsule Take 1 capsule (300 mg total) by mouth 3 (three) times daily. 11/06/15   Teressa Lower, NP  desonide (DESOWEN) 0.05 % ointment Apply 1 application topically 2 (two) times daily.  06/23/15   Historical Provider, MD  diphenhydrAMINE (BENADRYL) 25 mg capsule Take 25 mg by mouth every 8 (eight) hours as needed for itching or allergies.  01/15/15   Historical Provider, MD  fluocinonide ointment (LIDEX) 0.05 % Apply 1 application topically 2 (two) times daily.  06/23/15   Historical Provider, MD  fluticasone (FLONASE) 50 MCG/ACT nasal spray Place 2 sprays into both nostrils daily. 11/04/15  Pricilla LovelessScott Goldston, MD  HYDROcodone-acetaminophen (NORCO/VICODIN) 5-325 MG tablet Take 1-2 tablets by mouth every 6 (six) hours as needed. 11/06/15   Teressa LowerVrinda Pickering, NP  HYDROcodone-homatropine (HYCODAN) 5-1.5 MG/5ML syrup Take 5 mLs by mouth every 6 (six) hours as needed for cough. 11/04/15   Pricilla LovelessScott Goldston, MD  mycophenolate (CELLCEPT) 500 MG tablet Take 500 mg by mouth 2 (two) times daily.    Historical Provider, MD  naproxen (NAPROSYN) 500 MG tablet Take 1 tablet (500 mg total) by mouth 2 (two) times daily as needed for mild pain, moderate pain or  headache (TAKE WITH MEALS.). 08/14/15   Mercedes Camprubi-Soms, PA-C  pantoprazole (PROTONIX) 40 MG tablet Take 40 mg by mouth daily.  08/01/15   Historical Provider, MD  predniSONE (DELTASONE) 20 MG tablet Take 40 mg by mouth daily with breakfast.     Historical Provider, MD   BP 144/60 mmHg  Pulse 12  Temp(Src) 98.3 F (36.8 C) (Oral)  Resp 18  Ht 5\' 3"  (1.6 m)  Wt 78.472 kg  BMI 30.65 kg/m2  SpO2 98%  LMP 10/28/2015 Physical Exam  Constitutional: She is oriented to person, place, and time. She appears well-developed and well-nourished. No distress.  HENT:  Head: Normocephalic.  Eyes: EOM are normal.  Neck: Neck supple.  Cardiovascular: Normal rate.   Pulmonary/Chest: Effort normal.  Abdominal: Soft. Normal appearance and bowel sounds are normal. There is tenderness. There is no rebound and no guarding.  Mild suprapubic tenderness with deep palpation  Genitourinary:  External genitalia without lesions. Thick white d/c vaginal vault. Cervix inflamed, no CMT, no adnexal tenderness or mass palpated. Uterus without palpable enlargement.   Musculoskeletal: Normal range of motion.  Neurological: She is alert and oriented to person, place, and time. No cranial nerve deficit.  Skin: Skin is warm and dry.  Psychiatric: She has a normal mood and affect. Her behavior is normal.  Nursing note and vitals reviewed.   ED Course  Procedures (including critical care time) Labs Review   MDM  25 y.o. female with vaginal itching and d/c after finishing antibiotics for UTI the end of December stable for d/c without acute abdomen. Will treat for yeast infection and she will follow up with her GYN. . Discussed with the patient and all questioned fully answered. She will return if any problems arise.  Final diagnoses:  Monilial vaginitis       Janne NapoleonHope M Darcus Edds, NP 12/08/15 [phone]  Alvira MondayErin Schlossman, MD 12/10/15 [phone]

## 2015-12-06 NOTE — Discharge Instructions (Signed)
We have treated you with Diflucan here today for your yeast infection. You may use over the counter yeast cream in addition. Your cultures will be back in 24 to 48 hours we will only call if you need additional treatment.  Monilial Vaginitis Vaginitis in a soreness, swelling and redness (inflammation) of the vagina and vulva. Monilial vaginitis is not a sexually transmitted infection. CAUSES  Yeast vaginitis is caused by yeast (candida) that is normally found in your vagina. With a yeast infection, the candida has overgrown in number to a point that upsets the chemical balance. SYMPTOMS   White, thick vaginal discharge.  Swelling, itching, redness and irritation of the vagina and possibly the lips of the vagina (vulva).  Burning or painful urination.  Painful intercourse. DIAGNOSIS  Things that may contribute to monilial vaginitis are:  Postmenopausal and virginal states.  Pregnancy.  Infections.  Being tired, sick or stressed, especially if you had monilial vaginitis in the past.  Diabetes. Good control will help lower the chance.  Birth control pills.  Tight fitting garments.  Using bubble bath, feminine sprays, douches or deodorant tampons.  Taking certain medications that kill germs (antibiotics).  Sporadic recurrence can occur if you become ill. TREATMENT  Your caregiver will give you medication.  There are several kinds of anti monilial vaginal creams and suppositories specific for monilial vaginitis. For recurrent yeast infections, use a suppository or cream in the vagina 2 times a week, or as directed.  Anti-monilial or steroid cream for the itching or irritation of the vulva may also be used. Get your caregiver's permission.  Painting the vagina with methylene blue solution may help if the monilial cream does not work.  Eating yogurt may help prevent monilial vaginitis. HOME CARE INSTRUCTIONS   Finish all medication as prescribed.  Do not have sex until  treatment is completed or after your caregiver tells you it is okay.  Take warm sitz baths.  Do not douche.  Do not use tampons, especially scented ones.  Wear cotton underwear.  Avoid tight pants and panty hose.  Tell your sexual partner that you have a yeast infection. They should go to their caregiver if they have symptoms such as mild rash or itching.  Your sexual partner should be treated as well if your infection is difficult to eliminate.  Practice safer sex. Use condoms.  Some vaginal medications cause latex condoms to fail. Vaginal medications that harm condoms are:  Cleocin cream.  Butoconazole (Femstat).  Terconazole (Terazol) vaginal suppository.  Miconazole (Monistat) (may be purchased over the counter). SEEK MEDICAL CARE IF:   You have a temperature by mouth above 102 F (38.9 C).  The infection is getting worse after 2 days of treatment.  The infection is not getting better after 3 days of treatment.  You develop blisters in or around your vagina.  You develop vaginal bleeding, and it is not your menstrual period.  You have pain when you urinate.  You develop intestinal problems.  You have pain with sexual intercourse.   This information is not intended to replace advice given to you by your health care provider. Make sure you discuss any questions you have with your health care provider.   Document Released: 08/17/2005 Document Revised: 01/30/2012 Document Reviewed: 05/11/2015 Elsevier Interactive Patient Education [employer]2016 Elsevier Inc.

## 2015-12-07 LAB — URINE CULTURE

## 2015-12-07 LAB — GC/CHLAMYDIA PROBE AMP (~~LOC~~) NOT AT ARMC
Chlamydia: NEGATIVE
NEISSERIA GONORRHEA: NEGATIVE

## 2015-12-07 LAB — RPR: RPR: NONREACTIVE

## 2015-12-07 LAB — HIV ANTIBODY (ROUTINE TESTING W REFLEX): HIV Screen 4th Generation wRfx: NONREACTIVE

## 2015-12-08 ENCOUNTER — Telehealth: Payer: Self-pay

## 2015-12-08 ENCOUNTER — Telehealth: Payer: Self-pay | Admitting: *Deleted

## 2015-12-08 NOTE — Telephone Encounter (Signed)
Tried to contact patient to give her lab results Patient not available Message left on voice mail to return our call 

## 2015-12-08 NOTE — Telephone Encounter (Signed)
-----   Message from Ambrose Finland, NP sent at 12/08/2015 12:01 PM EST ----- STD's were negative from ER visit

## 2015-12-08 NOTE — Telephone Encounter (Signed)
Patient verified DOB Patient informed of STD screenings from the ER being negative. Patient expressed her understanding and had no further questions.

## 2015-12-31 ENCOUNTER — Telehealth: Payer: Self-pay | Admitting: Internal Medicine

## 2015-12-31 NOTE — Telephone Encounter (Signed)
Per pt's chart, hycodan was last refilled 12.14.16 by another physician not in this practice Last ov was 11.4.16 w/ TP, 9.30.16 with MR LMOM TCB x1 for pt Will need to see if MR is okay with filling this.  Pt will need to come to the office to pick this up

## 2016-01-01 MED ORDER — HYDROCODONE-HOMATROPINE 5-1.5 MG/5ML PO SYRP: 5.0000 mL | ORAL_SOLUTION | Freq: Four times a day (QID) | ORAL | Status: DC | PRN

## 2016-01-01 NOTE — Telephone Encounter (Signed)
MW okay'd to sign RX in MR absence. Rx printed out and placed for pick up. Pt aware. Nothing further needed

## 2016-01-01 NOTE — Telephone Encounter (Signed)
Pt is aware that we need MR's okay to refill this.  MR - please advise. Thanks.

## 2016-01-01 NOTE — Telephone Encounter (Signed)
That is fine 

## 2016-01-21 ENCOUNTER — Emergency Department (HOSPITAL_COMMUNITY)
Admission: EM | Admit: 2016-01-21 | Discharge: 2016-01-22 | Disposition: A | Discharge status: Home / Self Care | Payer: BLUE CROSS/BLUE SHIELD | Attending: Emergency Medicine | Admitting: Emergency Medicine

## 2016-01-21 ENCOUNTER — Emergency Department (HOSPITAL_COMMUNITY): Payer: BLUE CROSS/BLUE SHIELD

## 2016-01-21 ENCOUNTER — Encounter (HOSPITAL_COMMUNITY): Payer: Self-pay | Admitting: Emergency Medicine

## 2016-01-21 DIAGNOSIS — R079 Chest pain, unspecified: Secondary | ICD-10-CM

## 2016-01-21 DIAGNOSIS — Z7952 Long term (current) use of systemic steroids: Secondary | ICD-10-CM | POA: Diagnosis not present

## 2016-01-21 DIAGNOSIS — Z79899 Other long term (current) drug therapy: Secondary | ICD-10-CM | POA: Diagnosis not present

## 2016-01-21 DIAGNOSIS — N760 Acute vaginitis: Secondary | ICD-10-CM | POA: Insufficient documentation

## 2016-01-21 DIAGNOSIS — Z87891 Personal history of nicotine dependence: Secondary | ICD-10-CM | POA: Insufficient documentation

## 2016-01-21 DIAGNOSIS — Z3202 Encounter for pregnancy test, result negative: Secondary | ICD-10-CM | POA: Insufficient documentation

## 2016-01-21 DIAGNOSIS — M1990 Unspecified osteoarthritis, unspecified site: Secondary | ICD-10-CM | POA: Insufficient documentation

## 2016-01-21 DIAGNOSIS — Z8709 Personal history of other diseases of the respiratory system: Secondary | ICD-10-CM | POA: Insufficient documentation

## 2016-01-21 DIAGNOSIS — Z8701 Personal history of pneumonia (recurrent): Secondary | ICD-10-CM | POA: Diagnosis not present

## 2016-01-21 DIAGNOSIS — M329 Systemic lupus erythematosus, unspecified: Secondary | ICD-10-CM | POA: Diagnosis not present

## 2016-01-21 DIAGNOSIS — Z8719 Personal history of other diseases of the digestive system: Secondary | ICD-10-CM | POA: Diagnosis not present

## 2016-01-21 DIAGNOSIS — Z9889 Other specified postprocedural states: Secondary | ICD-10-CM | POA: Insufficient documentation

## 2016-01-21 DIAGNOSIS — B9689 Other specified bacterial agents as the cause of diseases classified elsewhere: Secondary | ICD-10-CM

## 2016-01-21 LAB — CBC
HCT: 45.4 % (ref 36.0–46.0)
Hemoglobin: 14.7 g/dL (ref 12.0–15.0)
MCH: 27.7 pg (ref 26.0–34.0)
MCHC: 32.4 g/dL (ref 30.0–36.0)
MCV: 85.5 fL (ref 78.0–100.0)
Platelets: 277 10*3/uL (ref 150–400)
RBC: 5.31 MIL/uL — ABNORMAL HIGH (ref 3.87–5.11)
RDW: 13.3 % (ref 11.5–15.5)
WBC: 6.6 10*3/uL (ref 4.0–10.5)

## 2016-01-21 LAB — BASIC METABOLIC PANEL
Anion gap: 11 (ref 5–15)
BUN: 10 mg/dL (ref 6–20)
CALCIUM: 9.2 mg/dL (ref 8.9–10.3)
CO2: 26 mmol/L (ref 22–32)
Chloride: 103 mmol/L (ref 101–111)
Creatinine, Ser: 1.06 mg/dL — ABNORMAL HIGH (ref 0.44–1.00)
GFR calc Af Amer: >60 mL/min (ref >60)
GLUCOSE: 121 mg/dL — AB (ref 65–99)
POTASSIUM: 4.1 mmol/L (ref 3.5–5.1)
SODIUM: 140 mmol/L (ref 135–145)

## 2016-01-21 LAB — I-STAT TROPONIN, ED: TROPONIN I, POC: 0.01 ng/mL (ref 0.00–0.08)

## 2016-01-21 NOTE — ED Notes (Signed)
Pt reports finishing an antibiotic for a UTI and developed vaginal discharge afterward; pt denies burning, itching, or foul odor

## 2016-01-21 NOTE — ED Notes (Signed)
Pt states this afternoon while just sitting she started having a squeezing and fluttering feeling in her chest. Pt denies any sob. Pt is warm and dry.

## 2016-01-22 DIAGNOSIS — R079 Chest pain, unspecified: Secondary | ICD-10-CM | POA: Diagnosis not present

## 2016-01-22 LAB — WET PREP, GENITAL
Sperm: NONE SEEN
Trich, Wet Prep: NONE SEEN
WBC WET PREP: NONE SEEN
YEAST WET PREP: NONE SEEN

## 2016-01-22 LAB — URINALYSIS, ROUTINE W REFLEX MICROSCOPIC
Bilirubin Urine: NEGATIVE
GLUCOSE, UA: NEGATIVE mg/dL
Hgb urine dipstick: NEGATIVE
KETONES UR: NEGATIVE mg/dL
LEUKOCYTES UA: NEGATIVE
NITRITE: NEGATIVE
PROTEIN: NEGATIVE mg/dL
Specific Gravity, Urine: 1.026 (ref 1.005–1.030)
pH: 6 (ref 5.0–8.0)

## 2016-01-22 LAB — GC/CHLAMYDIA PROBE AMP (~~LOC~~) NOT AT ARMC
Chlamydia: NEGATIVE
Neisseria Gonorrhea: NEGATIVE

## 2016-01-22 LAB — RPR: RPR: NONREACTIVE

## 2016-01-22 LAB — PREGNANCY, URINE: Preg Test, Ur: NEGATIVE

## 2016-01-22 LAB — HIV ANTIBODY (ROUTINE TESTING W REFLEX): HIV SCREEN 4TH GENERATION: NONREACTIVE

## 2016-01-22 MED ORDER — OSELTAMIVIR PHOSPHATE 75 MG PO CAPS: 75.0000 mg | ORAL_CAPSULE | Freq: Two times a day (BID) | ORAL | Status: DC

## 2016-01-22 MED ORDER — METRONIDAZOLE 500 MG PO TABS: 500.0000 mg | ORAL_TABLET | Freq: Two times a day (BID) | ORAL | Status: DC

## 2016-01-22 MED ORDER — IBUPROFEN 800 MG PO TABS
800.0000 mg | ORAL_TABLET | Freq: Once | ORAL | Status: AC
  Administered 2016-01-22: 800 mg via ORAL
  Filled 2016-01-22: qty 1

## 2016-01-22 NOTE — Discharge Instructions (Signed)
Ms. Lelan PonsLashonna Baltes,  Nice meeting you! Please follow-up with your gynecologist and primary care provider. Return to the emergency department if you develop increased chest pain, shortness of breath. Feel better soon!  S. Lane HackerNicole Lokelani Lutes, PA-C

## 2016-01-22 NOTE — ED Provider Notes (Signed)
CSN: [id number]     Arrival date & time 01/21/16  1903 History   First MD Initiated Contact with Patient 01/21/16 2314     Chief Complaint  Patient presents with  . Chest Pain  . Vaginal Discharge   HPI  Kayla [name] is a 25 y.o. female PMH significant for GERD, lupus, interstitial lung disease presenting with chest pain since afternoon. She describes her chest pain as squeezing, right-sided, nonradiating, 10 out of 10 pain scale, intermittent, worsened with certain movements. She also endorses a 1 week history of malodorous, increased vaginal discharge. She denies fevers, chills, cough, shortness of breath, abdominal pain, nausea, vomiting, ill contacts, history of this chest pain previously, recent surgeries, periods of immobility, prior history of blood clot.  Past Medical History  Diagnosis Date  . CAP (community acquired pneumonia) 01/07/2015  . GERD (gastroesophageal reflux disease)   . Daily headache     "sometimes" (01/08/2015)  . Arthritis     "hands and legs" (01/08/2015)  . Lupus (HCC)   . Lung disease    Past Surgical History  Procedure Laterality Date  . Finger surgery Right 03/2014    "laceration, nerve/artery injury" 2nd digit  . Video bronchoscopy Bilateral 01/12/2015    Procedure: VIDEO BRONCHOSCOPY WITH FLUORO;  Surgeon: Leslye Peer, MD;  Location: [facility] ENDOSCOPY;  Service: Cardiopulmonary;  Laterality: Bilateral;  . Dilation and evacuation N/A 07/13/2015    Procedure: DILATATION AND EVACUATION;  Surgeon: Levie Heritage, DO;  Location: [facility] ORS;  Service: Gynecology;  Laterality: N/A;  . Cardiac catheterization N/A 09/09/2015    Procedure: Right Heart Cath;  Surgeon: Laurey Morale, MD;  Location: [facility] INVASIVE CV LAB;  Service: Cardiovascular;  Laterality: N/A;   Family History  Problem Relation Age of Onset  . Diabetes Mother   . Diabetes Maternal Aunt   . Diabetes Maternal Grandmother    Social History  Substance Use Topics  . Smoking status: Former Smoker --  0.10 packs/day for 5 years    Types: Cigarettes    Quit date: 11/20/2014  . Smokeless tobacco: Never Used  . Alcohol Use: No     Comment: 01/08/2015 "last drink was New Year's Eve"   OB History    Gravida Para Term Preterm AB TAB SAB Ectopic Multiple Living       Review of Systems  Ten systems are reviewed and are negative for acute change except as noted in the HPI  Allergies  Hydrocodone and Zithromax  Home Medications   Prior to Admission medications   Medication Sig Start Date End Date Taking? Authorizing Provider  amLODipine (NORVASC) 5 MG tablet Take 5 mg by mouth daily.   Yes Historical Provider, MD  azaTHIOprine (IMURAN) 50 MG tablet Take 50 mg by mouth 3 (three) times daily.   Yes Historical Provider, MD  diphenhydrAMINE (BENADRYL) 25 mg capsule Take 25 mg by mouth every 8 (eight) hours as needed for itching or allergies.  01/15/15  Yes Historical Provider, MD  HYDROcodone-homatropine (HYCODAN) 5-1.5 MG/5ML syrup Take 5 mLs by mouth every 6 (six) hours as needed for cough. 01/01/16  Yes Nyoka Cowden, MD  predniSONE (DELTASONE) 20 MG tablet Take 15 mg by mouth daily with breakfast.    Yes Historical Provider, MD   BP 112/80 mmHg  Pulse 78  Temp(Src) 98.6 F (37 C) (Oral)  Resp 21  Ht  (1.6 m)  Wt 78.926 kg  BMI  30.83 kg/m2  SpO2 97%  LMP 01/16/2016 Physical Exam  Constitutional: She appears well-developed and well-nourished. No distress.  HENT:  Head: Normocephalic and atraumatic.  Mouth/Throat: Oropharynx is clear and moist. No oropharyngeal exudate.  Eyes: Conjunctivae are normal. Pupils are equal, round, and reactive to light. Right eye exhibits no discharge. Left eye exhibits no discharge. No scleral icterus.  Neck: No tracheal deviation present.  Cardiovascular: Normal rate, regular rhythm, normal heart sounds and intact distal pulses.  Exam reveals no gallop and no friction rub.   No murmur heard. Pulmonary/Chest: Effort normal and  breath sounds normal. No respiratory distress. She has no wheezes. She has no rales. She exhibits tenderness.  Right upper chest tenderness to palpation  Abdominal: Soft. Bowel sounds are normal. She exhibits no distension and no mass. There is no tenderness. There is no rebound and no guarding.  Genitourinary:  Pelvic exam: normal external genitalia, vulva, vagina, uterus and adnexa. Cervix with mild friability at transition zone.    Musculoskeletal: She exhibits no edema.  Lymphadenopathy:    She has no cervical adenopathy.  Neurological: She is alert. Coordination normal.  Skin: Skin is warm and dry. No rash noted. She is not diaphoretic. No erythema.  Psychiatric: She has a normal mood and affect. Her behavior is normal.  Nursing note and vitals reviewed.   ED Course  Procedures  Labs Review Labs Reviewed  BASIC METABOLIC PANEL - Abnormal; Notable for the following:    Glucose, Bld 121 (*)    Creatinine, Ser 1.06 (*)    All other components within normal limits  CBC - Abnormal; Notable for the following:    RBC 5.31 (*)    All other components within normal limits  URINALYSIS, ROUTINE W REFLEX MICROSCOPIC (NOT AT [facility]) - Abnormal; Notable for the following:    APPearance CLOUDY (*)    All other components within normal limits  WET PREP, GENITAL  PREGNANCY, URINE  RPR  HIV ANTIBODY (ROUTINE TESTING)  I-STAT TROPOININ, ED  GC/CHLAMYDIA PROBE AMP ([place]) NOT AT [facility]   Imaging Review Dg Chest 2 View  01/21/2016  CLINICAL DATA:  25 year old female with acute chest pain and shortness of breath today. History of lupus and chronic lung disease. EXAM: CHEST  2 VIEW COMPARISON:  11/04/2015 and prior radiographs FINDINGS: Upper limits normal heart size again noted. Chronic interstitial lung disease again identified. No definite airspace disease, pneumothorax, pleural effusions or mass noted. No acute bony abnormalities are present. IMPRESSION: Chronic interstitial lung disease  without definite acute abnormality. Electronically Signed   By: Harmon Pier M.D.   On: 01/21/2016 19:50   I have personally reviewed and evaluated these images and lab results as part of my medical decision-making.   EKG Interpretation   Date/Time:  Thursday January 21 2016 19:17:16 EST Ventricular Rate:  103 PR Interval:  132 QRS Duration: 76 QT Interval:  324 QTC Calculation: 424 R Axis:   65 Text Interpretation:  Sinus tachycardia T wave abnormality, consider  inferolateral ischemia Abnormal ECG No significant change since last  tracing Confirmed by Bebe Shaggy  MD, DONALD ([id number]) on 01/21/2016 11:07:28 PM      MDM   Final diagnoses:  Chest pain, unspecified chest pain type  Bacterial vaginosis   Patient nontoxic appearing, vital signs stable. Symptoms consistent with upper respiratory infection, viral etiology. Do not suspect ACS versus PE. Will perform pelvic exam and obtain wet prep and STI exposure labs. Cervix with mild friability at transition zone. Abdominal  and adnexal exam unremarkable. Do not suspect PID/TOA versus ectopic pregnancy. Urinalysis, pregnancy test, CBC, troponin, EKG unremarkable. BMP with slightly elevated creatinine of 1.06. Wet prep demonstrates clue cells. Patient may be safely discharged home with Flagyl and gynecology and primary care follow-up. Patient in understanding and agreement with the plan. Discussed return precautions.  Melton KrebsSamantha Nicole Adeena Bernabe, PA-C 01/22/16 0201  Zadie Rhineonald Wickline, MD 01/22/16 [phone]

## 2016-01-22 NOTE — ED Provider Notes (Signed)
Patient seen/examined in the Emergency Department in conjunction with Midlevel Provider  Patient reports vaginal discharge and also reports chest "squeezing" that is improving Exam : awake/alert, no distress, mild tenderness to palpation of right upper chest Plan: pt stable at this time.  I doubt ACS/PE/Dissection at this time   Zadie Rhineonald Wadsworth Skolnick, MD 01/22/16 [phone]

## 2016-02-02 ENCOUNTER — Encounter: Payer: No Typology Code available for payment source | Admitting: Women's Health

## 2016-02-09 ENCOUNTER — Other Ambulatory Visit (HOSPITAL_COMMUNITY)
Admission: RE | Admit: 2016-02-09 | Discharge: 2016-02-09 | Disposition: A | Discharge status: Home / Self Care | Payer: BLUE CROSS/BLUE SHIELD | Source: Ambulatory Visit | Attending: Family Medicine | Admitting: Family Medicine

## 2016-02-09 ENCOUNTER — Emergency Department (INDEPENDENT_AMBULATORY_CARE_PROVIDER_SITE_OTHER)
Admission: EM | Admit: 2016-02-09 | Discharge: 2016-02-09 | Disposition: A | Discharge status: Home / Self Care | Payer: BLUE CROSS/BLUE SHIELD | Source: Home / Self Care | Attending: Family Medicine | Admitting: Family Medicine

## 2016-02-09 ENCOUNTER — Encounter (HOSPITAL_COMMUNITY): Payer: Self-pay | Admitting: Emergency Medicine

## 2016-02-09 DIAGNOSIS — N12 Tubulo-interstitial nephritis, not specified as acute or chronic: Secondary | ICD-10-CM | POA: Insufficient documentation

## 2016-02-09 LAB — POCT URINALYSIS DIP (DEVICE)
GLUCOSE, UA: NEGATIVE mg/dL
Nitrite: POSITIVE — AB
Protein, ur: 30 mg/dL — AB
SPECIFIC GRAVITY, URINE: 1.015 (ref 1.005–1.030)
UROBILINOGEN UA: 2 mg/dL — AB (ref 0.0–1.0)
pH: 7 (ref 5.0–8.0)

## 2016-02-09 MED ORDER — LIDOCAINE HCL (PF) 1 % IJ SOLN
INTRAMUSCULAR | Status: AC
  Filled 2016-02-09: qty 5

## 2016-02-09 MED ORDER — CEFTRIAXONE SODIUM 1 G IJ SOLR
1.0000 g | Freq: Once | INTRAMUSCULAR | Status: AC
  Administered 2016-02-09: 1 g via INTRAMUSCULAR

## 2016-02-09 MED ORDER — LEVOFLOXACIN 500 MG PO TABS: 500.0000 mg | ORAL_TABLET | Freq: Every day | ORAL | Status: DC

## 2016-02-09 MED ORDER — KETOROLAC TROMETHAMINE 60 MG/2ML IM SOLN
INTRAMUSCULAR | Status: AC
  Filled 2016-02-09: qty 2

## 2016-02-09 MED ORDER — CEFTRIAXONE SODIUM 1 G IJ SOLR
INTRAMUSCULAR | Status: AC
  Filled 2016-02-09: qty 10

## 2016-02-09 MED ORDER — KETOROLAC TROMETHAMINE 60 MG/2ML IM SOLN
60.0000 mg | Freq: Once | INTRAMUSCULAR | Status: AC
  Administered 2016-02-09: 60 mg via INTRAMUSCULAR

## 2016-02-09 NOTE — ED Notes (Signed)
Patient was observed for 20 minutes for reaction to rocephin injection. Patient reported no symptoms or complaints.

## 2016-02-09 NOTE — Discharge Instructions (Signed)

## 2016-02-09 NOTE — ED Notes (Signed)
The patient [facility] with a complaint of left flank and upper back pain that started yesterday. The patient denied any known injury.

## 2016-02-09 NOTE — ED Provider Notes (Signed)
CSN: [id number]     Arrival date & time 02/09/16  1900 History   First MD Initiated Contact with Patient 02/09/16 2054     Chief Complaint  Patient presents with  . Flank Pain  . Back Pain   (Consider location/radiation/quality/duration/timing/severity/associated sxs/prior Treatment) HPI Onset of symptoms 2 days ago. And now has left flank pain radiating into the left lower quadrant. She denies any vomiting or fever. States that it just hurts significantly. She states urine has been very dark but not bloody. Past Medical History  Diagnosis Date  . CAP (community acquired pneumonia) 01/07/2015  . GERD (gastroesophageal reflux disease)   . Daily headache     "sometimes" (01/08/2015)  . Arthritis     "hands and legs" (01/08/2015)  . Lupus (HCC)   . Lung disease    Past Surgical History  Procedure Laterality Date  . Finger surgery Right 03/2014    "laceration, nerve/artery injury" 2nd digit  . Video bronchoscopy Bilateral 01/12/2015    Procedure: VIDEO BRONCHOSCOPY WITH FLUORO;  Surgeon: Leslye Peerobert S Byrum, MD;  [facility];  Service: Cardiopulmonary;  Laterality: Bilateral;  . Dilation and evacuation N/A 07/13/2015    Procedure: DILATATION AND EVACUATION;  Surgeon: Levie HeritageJacob J Stinson, DO;  Location: [facility] ORS;  Service: Gynecology;  Laterality: N/A;  . Cardiac catheterization N/A 09/09/2015    Procedure: Right Heart Cath;  Surgeon: Laurey Moralealton S McLean, MD;  [facility];  Service: Cardiovascular;  Laterality: N/A;   Family History  Problem Relation Age of Onset  . Diabetes Mother   . Diabetes Maternal Aunt   . Diabetes Maternal Grandmother    Social History  Substance Use Topics  . Smoking status: Former Smoker -- 0.10 packs/day for 5 years    Types: Cigarettes    Quit date: 11/20/2014  . Smokeless tobacco: Never Used  . Alcohol Use: No     Comment: 01/08/2015 "last drink was New Year's Eve"   OB History    Gravida Para Term Preterm AB TAB SAB Ectopic Multiple  Living   1 0 0 0 0 0 0 0 0 0      Review of Systems Left flank pain Allergies  Hydrocodone and Zithromax  Home Medications   Prior to Admission medications   Medication Sig Start Date End Date Taking? Authorizing Provider  amLODipine (NORVASC) 5 MG tablet Take 5 mg by mouth daily.   Yes Historical Provider, MD  azaTHIOprine (IMURAN) 50 MG tablet Take 50 mg by mouth 3 (three) times daily.   Yes Historical Provider, MD  predniSONE (DELTASONE) 20 MG tablet Take 15 mg by mouth daily with breakfast.    Yes Historical Provider, MD  diphenhydrAMINE (BENADRYL) 25 mg capsule Take 25 mg by mouth every 8 (eight) hours as needed for itching or allergies.  01/15/15   Historical Provider, MD  HYDROcodone-homatropine (HYCODAN) 5-1.5 MG/5ML syrup Take 5 mLs by mouth every 6 (six) hours as needed for cough. 01/01/16   Nyoka CowdenMichael B Wert, MD  levofloxacin (LEVAQUIN) 500 MG tablet Take 1 tablet (500 mg total) by mouth daily. 02/09/16   Tharon AquasFrank C Archit Leger, PA  metroNIDAZOLE (FLAGYL) 500 MG tablet Take 1 tablet (500 mg total) by mouth 2 (two) times daily. 01/22/16   Melton KrebsSamantha Nicole Riley, PA-C  oseltamivir (TAMIFLU) 75 MG capsule Take 1 capsule (75 mg total) by mouth every 12 (twelve) hours. 01/22/16   Melton KrebsSamantha Nicole Riley, PA-C   Meds Ordered and Administered this Visit   Medications  cefTRIAXone (ROCEPHIN)  injection 1 g (not administered)  ketorolac (TORADOL) injection 60 mg (60 mg Intramuscular Given 02/09/16 2120)    BP 138/91 mmHg  Pulse 120  Temp(Src) 99.7 F (37.6 C) (Oral)  Resp 16  SpO2 96%  LMP 02/08/2016 (Exact Date)  Breastfeeding? No No data found.   Physical Exam NURSES NOTES AND VITAL SIGNS REVIEWED. CONSTITUTIONAL: Well developed, well nourished,Appears ill but nontoxic. HEENT: normocephalic, atraumatic, right and left TM's are normal EYES: Conjunctiva normal NECK:normal ROM, supple, no adenopathy PULMONARY:No respiratory distress, normal effort, Lungs: CTAb/l, no wheezes, or increased work  of breathing CARDIOVASCULAR: RRR, no murmur ABDOMEN: soft, ND, NT, +'ve BS There is tenderness noted in left flank. No suprapubic tenderness. No rebound no guarding. MUSCULOSKELETAL: Normal ROM of all extremities,  SKIN: warm and dry without rash PSYCHIATRIC: Mood and affect, behavior are normal  ED Course  Procedures (including critical care time)  Labs Review Labs Reviewed  POCT URINALYSIS DIP (DEVICE) - Abnormal; Notable for the following:    Bilirubin Urine SMALL (*)    Ketones, ur TRACE (*)    Hgb urine dipstick MODERATE (*)    Protein, ur 30 (*)    Urobilinogen, UA 2.0 (*)    Nitrite POSITIVE (*)    Leukocytes, UA LARGE (*)    All other components within normal limits    Imaging Review No results found.   Visual Acuity Review  Right Eye Distance:   Left Eye Distance:   Bilateral Distance:    Right Eye Near:   Left Eye Near:    Bilateral Near:        Treatment for pyelonephritis with Levaquin 500 mg for 7 days. Because patient's pharmacy is not open at this time we'll give her a single dose of ceftriaxone and have her pick up her prescription tomorrow. She was also given a single dose of ketorolac 60 mg IM for discomfort. Reasons to return to the clinic or ER have been given to the patient. MDM   1. Pyelonephritis     Patient is reassured that there are no issues that require transfer to higher level of care at this time or additional tests. Patient is advised to continue home symptomatic treatment. Patient is advised that if there are new or worsening symptoms to attend the emergency department, contact primary care provider, or return to UC. Instructions of care provided discharged home in stable condition. Return to work/school note provided.   THIS NOTE WAS GENERATED USING A VOICE RECOGNITION SOFTWARE PROGRAM. ALL REASONABLE EFFORTS  WERE MADE TO PROOFREAD THIS DOCUMENT FOR ACCURACY.  I have verbally reviewed the discharge instructions with the patient.  A printed AVS was given to the patient.  All questions were answered prior to discharge.      Tharon Aquas, PA 02/09/16 2135

## 2016-02-13 LAB — URINE CULTURE

## 2016-02-15 ENCOUNTER — Telehealth: Payer: Self-pay | Admitting: Internal Medicine

## 2016-02-15 NOTE — ED Notes (Signed)
Please let patient know that urine culture grew an E coli that is resistant to levaquin; levaquin is very effective and is sometimes used to treat germs that are resistant to it, because it gets into the tissues and into the urine in very high concentrations. If she still has back/side pain, or fever, or urinary discomfort/frequency, should be treated for an additional 5-7 days with cephalexin 500mg  twice daily or nitrofurantoin 100mg  twice daily (the E coli is sensitive to these).  Recheck or followup pcp Holland CommonsValerie Keck for persistent symptoms.  Ria ClockLaura Kam Rahimi MD

## 2016-02-16 NOTE — ED Notes (Signed)
Called pt and notified of recent lab results from visit 3/21 Pt ID'd properly... Reports feeling a little bit better but wanted us to go ahead and call in Keflex to pharm [facility] ([place] and spoke w/Ben (pharmacist)... Rx has been called in for Keflex 500 mg BID x7 days  Per Dr. Dayton ScrapeMurray,  Urine culture grew an E coli that is resistant to levaquin; levaquin is very effective and is sometimes used to treat germs that are resistant to it, because it gets into the tissues and into the urine in very high concentrations. If you are still having back/side pain, or fever, or urinary discomfort/frequency, you should be treated for an additional 5-7 days with cephalexin 500mg  twice daily or nitrofurantoin 100mg  twice daily (the E coli germ in your urine is sensitive to these). Please let us know if this is the case, and we will send a prescription to your pharmacy of record [facility] on [address] Recheck or followup pcp Holland CommonsValerie Keck for persistent symptoms. Result note copied to patient's My Chart. Ria ClockLaura Murray MD  Adv pt if sx are not getting better to return  Pt verb understanding

## 2016-02-18 ENCOUNTER — Emergency Department (HOSPITAL_COMMUNITY): Payer: BLUE CROSS/BLUE SHIELD

## 2016-02-18 ENCOUNTER — Encounter (HOSPITAL_COMMUNITY): Payer: Self-pay | Admitting: Emergency Medicine

## 2016-02-18 ENCOUNTER — Emergency Department (HOSPITAL_COMMUNITY)
Admission: EM | Admit: 2016-02-18 | Discharge: 2016-02-18 | Disposition: A | Discharge status: Home / Self Care | Payer: BLUE CROSS/BLUE SHIELD | Attending: Emergency Medicine | Admitting: Emergency Medicine

## 2016-02-18 DIAGNOSIS — Z8701 Personal history of pneumonia (recurrent): Secondary | ICD-10-CM | POA: Insufficient documentation

## 2016-02-18 DIAGNOSIS — R1084 Generalized abdominal pain: Secondary | ICD-10-CM | POA: Diagnosis not present

## 2016-02-18 DIAGNOSIS — M1990 Unspecified osteoarthritis, unspecified site: Secondary | ICD-10-CM | POA: Insufficient documentation

## 2016-02-18 DIAGNOSIS — Z79899 Other long term (current) drug therapy: Secondary | ICD-10-CM | POA: Diagnosis not present

## 2016-02-18 DIAGNOSIS — Z7952 Long term (current) use of systemic steroids: Secondary | ICD-10-CM | POA: Diagnosis not present

## 2016-02-18 DIAGNOSIS — Z87891 Personal history of nicotine dependence: Secondary | ICD-10-CM | POA: Insufficient documentation

## 2016-02-18 DIAGNOSIS — M546 Pain in thoracic spine: Secondary | ICD-10-CM | POA: Insufficient documentation

## 2016-02-18 DIAGNOSIS — Z9889 Other specified postprocedural states: Secondary | ICD-10-CM | POA: Diagnosis not present

## 2016-02-18 DIAGNOSIS — Z792 Long term (current) use of antibiotics: Secondary | ICD-10-CM | POA: Insufficient documentation

## 2016-02-18 DIAGNOSIS — M329 Systemic lupus erythematosus, unspecified: Secondary | ICD-10-CM | POA: Insufficient documentation

## 2016-02-18 DIAGNOSIS — B349 Viral infection, unspecified: Secondary | ICD-10-CM | POA: Insufficient documentation

## 2016-02-18 DIAGNOSIS — Z8719 Personal history of other diseases of the digestive system: Secondary | ICD-10-CM | POA: Insufficient documentation

## 2016-02-18 DIAGNOSIS — R51 Headache: Secondary | ICD-10-CM | POA: Diagnosis present

## 2016-02-18 DIAGNOSIS — Z8709 Personal history of other diseases of the respiratory system: Secondary | ICD-10-CM | POA: Insufficient documentation

## 2016-02-18 LAB — CBC WITH DIFFERENTIAL/PLATELET
Basophils Absolute: 0 10*3/uL (ref 0.0–0.1)
Basophils Relative: 0 %
Eosinophils Absolute: 0 10*3/uL (ref 0.0–0.7)
Eosinophils Relative: 0 %
HEMATOCRIT: 44.1 % (ref 36.0–46.0)
HEMOGLOBIN: 14.5 g/dL (ref 12.0–15.0)
LYMPHS ABS: 1.1 10*3/uL (ref 0.7–4.0)
Lymphocytes Relative: 20 %
MCH: 27.3 pg (ref 26.0–34.0)
MCHC: 32.9 g/dL (ref 30.0–36.0)
MCV: 83.1 fL (ref 78.0–100.0)
MONOS PCT: 10 %
Monocytes Absolute: 0.5 10*3/uL (ref 0.1–1.0)
NEUTROS ABS: 3.7 10*3/uL (ref 1.7–7.7)
NEUTROS PCT: 70 %
Platelets: 262 10*3/uL (ref 150–400)
RBC: 5.31 MIL/uL — ABNORMAL HIGH (ref 3.87–5.11)
RDW: 13.4 % (ref 11.5–15.5)
WBC: 5.4 10*3/uL (ref 4.0–10.5)

## 2016-02-18 LAB — BASIC METABOLIC PANEL
Anion gap: 8 (ref 5–15)
BUN: 6 mg/dL (ref 6–20)
CHLORIDE: 103 mmol/L (ref 101–111)
CO2: 27 mmol/L (ref 22–32)
CREATININE: 0.78 mg/dL (ref 0.44–1.00)
Calcium: 9.1 mg/dL (ref 8.9–10.3)
GFR calc non Af Amer: >60 mL/min (ref >60)
Glucose, Bld: 94 mg/dL (ref 65–99)
Potassium: 3.4 mmol/L — ABNORMAL LOW (ref 3.5–5.1)
Sodium: 138 mmol/L (ref 135–145)

## 2016-02-18 MED ORDER — DIPHENHYDRAMINE HCL 50 MG/ML IJ SOLN
25.0000 mg | Freq: Once | INTRAMUSCULAR | Status: AC
Start: 2016-02-18 — End: 2016-02-18
  Administered 2016-02-18: 25 mg via INTRAVENOUS
  Filled 2016-02-18: qty 1

## 2016-02-18 MED ORDER — SODIUM CHLORIDE 0.9 % IV BOLUS (SEPSIS)
1000.0000 mL | Freq: Once | INTRAVENOUS | Status: AC
  Administered 2016-02-18: 1000 mL via INTRAVENOUS

## 2016-02-18 MED ORDER — KETOROLAC TROMETHAMINE 30 MG/ML IJ SOLN
30.0000 mg | Freq: Once | INTRAMUSCULAR | Status: AC
  Administered 2016-02-18: 30 mg via INTRAVENOUS
  Filled 2016-02-18: qty 1

## 2016-02-18 MED ORDER — METOCLOPRAMIDE HCL 5 MG/ML IJ SOLN
10.0000 mg | Freq: Once | INTRAMUSCULAR | Status: AC
Start: 2016-02-18 — End: 2016-02-18
  Administered 2016-02-18: 10 mg via INTRAVENOUS
  Filled 2016-02-18: qty 2

## 2016-02-18 NOTE — ED Provider Notes (Signed)
CSN: [id number]     Arrival date & time 02/18/16  2050 History  By signing my name below, I, Kayla [name], attest that this documentation has been prepared under the direction and in the presence of Kayla Morn, NP. Electronically Signed: [name], ED Scribe. 02/18/2016. 9:33 PM.     Chief Complaint  Patient presents with  . Migraine  . Generalized Body Aches  . Emesis  . Chest Pain    rib pain  . Back Pain   Patient is a 25 y.o. female presenting with headaches. The history is provided by the patient. No language interpreter was used.  Headache Pain location:  R parietal Quality: pressure. Radiates to:  Does not radiate Onset quality:  Gradual Timing:  Constant Progression:  Unchanged Chronicity:  New Similar to prior headaches: no   Relieved by:  None tried Worsened by:  Nothing Ineffective treatments:  None tried Associated symptoms: abdominal pain, back pain, cough, myalgias, nausea, photophobia and vomiting   Associated symptoms: no diarrhea, no ear pain and no sore throat      HPI Comments: Kayla [name] is a 25 y.o. female who presents to the Emergency Department complaining of gradual onset, constant, generalized body aches, worse in mid back, that began two days ago. Pt also complains of a pressure like headache, photophobia, cough, diffuse abdominal pain, nausea, and vomiting. She states this headache is worse than regular headaches she has had in the past. Pt is currently on Levaquin and Keflex for a UTI. She began the Keflex 2 days ago after her urine culture came back resistant to Levaquin. Pt cannot tell if the Keflex is alleviating her UTI yet. Denies sore throat, ear pain, diarrhea, or any other associated symptoms.   Past Medical History  Diagnosis Date  . CAP (community acquired pneumonia) 01/07/2015  . GERD (gastroesophageal reflux disease)   . Daily headache     "sometimes" (01/08/2015)  . Arthritis     "hands and legs" (01/08/2015)  . Lupus (HCC)    . Lung disease    Past Surgical History  Procedure Laterality Date  . Finger surgery Right 03/2014    "laceration, nerve/artery injury" 2nd digit  . Video bronchoscopy Bilateral 01/12/2015    Procedure: VIDEO BRONCHOSCOPY WITH FLUORO;  Surgeon: Leslye Peer, MD;  Location[facility] ENDOSCOPY;  Service: Cardiopulmonary;  Laterality: Bilateral;  . Dilation and evacuation N/A 07/13/2015    Procedure: DILATATION AND EVACUATION;  Surgeon: Levie Heritage, DO;  Location: [facility] ORS;  Service: Gynecology;  Laterality: N/A;  . Cardiac catheterization N/A 09/09/2015    Procedure: Right Heart Cath;  Surgeon: Laurey Morale, MD;  [facility] INVASIVE CV LAB;  Service: Cardiovascular;  Laterality: N/A;   Family History  Problem Relation Age of Onset  . Diabetes Mother   . Diabetes Maternal Aunt   . Diabetes Maternal Grandmother    Social History  Substance Use Topics  . Smoking status: Former Smoker -- 0.10 packs/day for 5 years    Types: Cigarettes    Quit date: 11/20/2014  . Smokeless tobacco: Never Used  . Alcohol Use: No     Comment: 01/08/2015 "last drink was New Year's Eve"   OB History    Gravida Para Term Preterm AB TAB SAB Ectopic Multiple Living       Review of Systems  HENT: Negative for ear pain and sore throat.   Eyes: Positive for photophobia.  Respiratory: Positive  for cough.   Gastrointestinal: Positive for nausea, vomiting and abdominal pain. Negative for diarrhea.  Musculoskeletal: Positive for myalgias and back pain.  Neurological: Positive for headaches.  All other systems reviewed and are negative.   Allergies  Hydrocodone and Zithromax  Home Medications   Prior to Admission medications   Medication Sig Start Date End Date Taking? Authorizing Provider  amLODipine (NORVASC) 5 MG tablet Take 5 mg by mouth daily.    Historical Provider, MD  azaTHIOprine (IMURAN) 50 MG tablet Take 50 mg by mouth 3 (three) times daily.    Historical Provider, MD   diphenhydrAMINE (BENADRYL) 25 mg capsule Take 25 mg by mouth every 8 (eight) hours as needed for itching or allergies.  01/15/15   Historical Provider, MD  HYDROcodone-homatropine (HYCODAN) 5-1.5 MG/5ML syrup Take 5 mLs by mouth every 6 (six) hours as needed for cough. 01/01/16   Nyoka CowdenMichael B Wert, MD  levofloxacin (LEVAQUIN) 500 MG tablet Take 1 tablet (500 mg total) by mouth daily. 02/09/16   Tharon AquasFrank C Patrick, PA  metroNIDAZOLE (FLAGYL) 500 MG tablet Take 1 tablet (500 mg total) by mouth 2 (two) times daily. 01/22/16   Melton KrebsSamantha Nicole Riley, PA-C  oseltamivir (TAMIFLU) 75 MG capsule Take 1 capsule (75 mg total) by mouth every 12 (twelve) hours. 01/22/16   Melton KrebsSamantha Nicole Riley, PA-C  predniSONE (DELTASONE) 20 MG tablet Take 15 mg by mouth daily with breakfast.     Historical Provider, MD   BP 129/87 mmHg  Pulse 116  Temp(Src) 98.5 F (36.9 C) (Oral)  Resp 18  Ht 5\' 3"  (1.6 m)  Wt 173 lb (78.472 kg)  BMI 30.65 kg/m2  SpO2 100%  LMP 02/08/2016 (Approximate)   Physical Exam  Constitutional: She is oriented to person, place, and time. She appears well-developed and well-nourished. No distress.  HENT:  Head: Normocephalic and atraumatic.  Eyes: Conjunctivae and EOM are normal.  Neck: Neck supple. No tracheal deviation present.  Cardiovascular: Normal rate.   Pulmonary/Chest: Effort normal. No respiratory distress.  Harsh breath sounds mostly on the left  Abdominal: Soft. There is no tenderness. There is no CVA tenderness.  Musculoskeletal: Normal range of motion. She exhibits tenderness.  Low posterior rib discomfort  Neurological: She is alert and oriented to person, place, and time.  Skin: Skin is warm and dry.  Psychiatric: She has a normal mood and affect. Her behavior is normal.  Nursing note and vitals reviewed.   ED Course  Procedures (including critical care time)  DIAGNOSTIC STUDIES: Oxygen Saturation is 100% on RA, normal by my interpretation.    COORDINATION OF CARE: 9:32  PM-Discussed treatment plan which includes CXR, CBC, BMP, and migraine cocktail with pt at bedside and pt agreed to plan.   Labs Review Labs Reviewed  CBC WITH DIFFERENTIAL/PLATELET - Abnormal; Notable for the following:    RBC 5.31 (*)    All other components within normal limits  BASIC METABOLIC PANEL - Abnormal; Notable for the following:    Potassium 3.4 (*)    All other components within normal limits    Imaging Review Dg Chest 2 View  02/18/2016  CLINICAL DATA:  25 year old female with chest pain, cough, and chills. EXAM: CHEST  2 VIEW COMPARISON:  Radiograph dated 01/21/2016 and chest CT dated 05/11/2015 FINDINGS: Stable appearing bilateral linear and streaky densities most compatible with underlying interstitial lung disease. No new consolidation identified. There is no pleural effusion or pneumothorax. The cardiac silhouette is within normal limits with no acute osseous  pathology. IMPRESSION: No interval change. Electronically Signed   By: Elgie Collard M.D.   On: 02/18/2016 22:21   I have personally reviewed and evaluated these images and lab results as part of my medical decision-making.   EKG Interpretation None     Patient feels better after IV fluids and medications. MDM   Final diagnoses:  None  Pt symptoms consistent with URI. CXR negative for acute infiltrate. Pt will be discharged with symptomatic treatment.  Discussed return precautions.  Pt is hemodynamically stable & in NAD prior to discharge.    I personally performed the services described in this documentation, which was scribed in my presence. The recorded information has been reviewed and is accurate.      Kayla Morn, NP 02/18/16 2303  Laurence Spates, MD 02/19/16 [phone]

## 2016-02-18 NOTE — Discharge Instructions (Signed)

## 2016-02-18 NOTE — ED Notes (Addendum)
Pt presents to ER with generalized body aches, rib pain, and back pain, migraine headache with nausea and vomiting; pt [facility] kidney infection with ABX treatment; pt reports vomiting 3 times today; some photophobia with migraine

## 2016-03-02 ENCOUNTER — Encounter: Payer: Self-pay | Admitting: Women's Health

## 2016-03-02 ENCOUNTER — Ambulatory Visit (INDEPENDENT_AMBULATORY_CARE_PROVIDER_SITE_OTHER): Payer: BLUE CROSS/BLUE SHIELD | Admitting: Women's Health

## 2016-03-02 VITALS — BP 130/80 | Ht 63.0 in | Wt 165.0 lb

## 2016-03-02 DIAGNOSIS — N760 Acute vaginitis: Secondary | ICD-10-CM | POA: Diagnosis not present

## 2016-03-02 DIAGNOSIS — R350 Frequency of micturition: Secondary | ICD-10-CM | POA: Diagnosis not present

## 2016-03-02 DIAGNOSIS — Z3009 Encounter for other general counseling and advice on contraception: Secondary | ICD-10-CM | POA: Diagnosis not present

## 2016-03-02 DIAGNOSIS — A499 Bacterial infection, unspecified: Secondary | ICD-10-CM

## 2016-03-02 DIAGNOSIS — B9689 Other specified bacterial agents as the cause of diseases classified elsewhere: Secondary | ICD-10-CM

## 2016-03-02 LAB — URINALYSIS W MICROSCOPIC + REFLEX CULTURE
BILIRUBIN URINE: NEGATIVE
CRYSTALS: NONE SEEN [HPF]
Casts: NONE SEEN [LPF]
GLUCOSE, UA: NEGATIVE
HGB URINE DIPSTICK: NEGATIVE
KETONES UR: NEGATIVE
Leukocytes, UA: NEGATIVE
Nitrite: NEGATIVE
PH: 6 (ref 5.0–8.0)
RBC / HPF: NONE SEEN RBC/HPF (ref <=2)
Specific Gravity, Urine: 1.025 (ref 1.001–1.035)
WBC UA: NONE SEEN WBC/HPF (ref <=5)
Yeast: NONE SEEN [HPF]

## 2016-03-02 LAB — WET PREP FOR TRICH, YEAST, CLUE
TRICH WET PREP: NONE SEEN
YEAST WET PREP: NONE SEEN

## 2016-03-02 MED ORDER — MEDROXYPROGESTERONE ACETATE 150 MG/ML IM SUSP: 150.0000 mg | Freq: Once | INTRAMUSCULAR | Status: DC

## 2016-03-02 MED ORDER — METRONIDAZOLE 0.75 % VA GEL: VAGINAL | Status: DC

## 2016-03-02 NOTE — Patient Instructions (Signed)

## 2016-03-02 NOTE — Progress Notes (Signed)
Patient ID: [name], female   DOB: [birth year], 25 y.o.   MRN: [id number] Presents with complaint of increased vaginal discharge with irritation and odor. Has been treated for BV in January and in March. Reports symptoms get better but return quickly, feels the gel is more effective than pills. Same partner using no contraception, negative STD screen. Does not desire pregnancy at this time. History of miscarriage. Has lupus and pulmonary hypertension pulmonologist manages. Denies pain or burning with urination, abdominal pain or fever.  Exam: Appears well. Abdomen obese, nontender, external genitalia within normal limits, speculum exam moderate amount of milky white discharge with odor, wet prep positive for moderate clues, TNTC bacteria. Bimanual no CMT or tenderness with exam.  Recurrent Bacteria vaginosis Contraception management  Plan: MetroGel vaginal cream 1 applicator at bedtime 7, and then weekly for several weeks. When symptoms totally resolve boric acid gelcaps twice weekly per vagina. Instructed to call if no relief of symptoms. Contraception options reviewed has used Depo-Provera in the past, Depo-Provera 150 every 12 week start with next cycle, schedule annual exam in June to evaluate. Reviewed best to postpone pregnancy until lupus is in better control.

## 2016-03-04 LAB — URINE CULTURE
Colony Count: NO GROWTH
Organism ID, Bacteria: NO GROWTH

## 2016-03-06 ENCOUNTER — Encounter: Payer: Self-pay | Admitting: Women's Health

## 2016-03-14 ENCOUNTER — Inpatient Hospital Stay (HOSPITAL_COMMUNITY): Payer: BLUE CROSS/BLUE SHIELD

## 2016-03-14 ENCOUNTER — Inpatient Hospital Stay (HOSPITAL_COMMUNITY)
Admission: AD | Admit: 2016-03-14 | Discharge: 2016-03-14 | Disposition: A | Discharge status: Home / Self Care | Payer: BLUE CROSS/BLUE SHIELD | Source: Ambulatory Visit | Attending: Family Medicine | Admitting: Family Medicine

## 2016-03-14 ENCOUNTER — Encounter (HOSPITAL_COMMUNITY): Payer: Self-pay | Admitting: *Deleted

## 2016-03-14 DIAGNOSIS — M329 Systemic lupus erythematosus, unspecified: Secondary | ICD-10-CM | POA: Diagnosis not present

## 2016-03-14 DIAGNOSIS — R1032 Left lower quadrant pain: Secondary | ICD-10-CM | POA: Diagnosis present

## 2016-03-14 DIAGNOSIS — O26891 Other specified pregnancy related conditions, first trimester: Secondary | ICD-10-CM | POA: Insufficient documentation

## 2016-03-14 DIAGNOSIS — O10911 Unspecified pre-existing hypertension complicating pregnancy, first trimester: Secondary | ICD-10-CM | POA: Diagnosis not present

## 2016-03-14 DIAGNOSIS — Z7952 Long term (current) use of systemic steroids: Secondary | ICD-10-CM | POA: Insufficient documentation

## 2016-03-14 DIAGNOSIS — O9989 Other specified diseases and conditions complicating pregnancy, childbirth and the puerperium: Secondary | ICD-10-CM | POA: Diagnosis not present

## 2016-03-14 DIAGNOSIS — Z3201 Encounter for pregnancy test, result positive: Secondary | ICD-10-CM | POA: Insufficient documentation

## 2016-03-14 DIAGNOSIS — Z885 Allergy status to narcotic agent status: Secondary | ICD-10-CM | POA: Diagnosis not present

## 2016-03-14 DIAGNOSIS — Z79899 Other long term (current) drug therapy: Secondary | ICD-10-CM | POA: Diagnosis not present

## 2016-03-14 DIAGNOSIS — K219 Gastro-esophageal reflux disease without esophagitis: Secondary | ICD-10-CM | POA: Diagnosis not present

## 2016-03-14 DIAGNOSIS — O43891 Other placental disorders, first trimester: Secondary | ICD-10-CM | POA: Insufficient documentation

## 2016-03-14 DIAGNOSIS — O418X10 Other specified disorders of amniotic fluid and membranes, first trimester, not applicable or unspecified: Secondary | ICD-10-CM

## 2016-03-14 DIAGNOSIS — R102 Pelvic and perineal pain: Secondary | ICD-10-CM | POA: Diagnosis not present

## 2016-03-14 DIAGNOSIS — Z881 Allergy status to other antibiotic agents status: Secondary | ICD-10-CM | POA: Diagnosis not present

## 2016-03-14 DIAGNOSIS — I10 Essential (primary) hypertension: Secondary | ICD-10-CM

## 2016-03-14 DIAGNOSIS — Z87891 Personal history of nicotine dependence: Secondary | ICD-10-CM | POA: Insufficient documentation

## 2016-03-14 DIAGNOSIS — R109 Unspecified abdominal pain: Secondary | ICD-10-CM

## 2016-03-14 DIAGNOSIS — O26899 Other specified pregnancy related conditions, unspecified trimester: Secondary | ICD-10-CM

## 2016-03-14 DIAGNOSIS — Z3A00 Weeks of gestation of pregnancy not specified: Secondary | ICD-10-CM | POA: Diagnosis not present

## 2016-03-14 DIAGNOSIS — O468X1 Other antepartum hemorrhage, first trimester: Secondary | ICD-10-CM

## 2016-03-14 LAB — CBC
HEMATOCRIT: 43.3 % (ref 36.0–46.0)
Hemoglobin: 15 g/dL (ref 12.0–15.0)
MCH: 28 pg (ref 26.0–34.0)
MCHC: 34.6 g/dL (ref 30.0–36.0)
MCV: 80.8 fL (ref 78.0–100.0)
PLATELETS: 265 10*3/uL (ref 150–400)
RBC: 5.36 MIL/uL — AB (ref 3.87–5.11)
RDW: 13.2 % (ref 11.5–15.5)
WBC: 6.2 10*3/uL (ref 4.0–10.5)

## 2016-03-14 LAB — URINALYSIS, ROUTINE W REFLEX MICROSCOPIC
Bilirubin Urine: NEGATIVE
GLUCOSE, UA: NEGATIVE mg/dL
Hgb urine dipstick: NEGATIVE
KETONES UR: NEGATIVE mg/dL
LEUKOCYTES UA: NEGATIVE
Nitrite: NEGATIVE
PROTEIN: NEGATIVE mg/dL
Specific Gravity, Urine: 1.01 (ref 1.005–1.030)
pH: 6.5 (ref 5.0–8.0)

## 2016-03-14 LAB — HCG, QUANTITATIVE, PREGNANCY: hCG, Beta Chain, Quant, S: 18527 m[IU]/mL — ABNORMAL HIGH (ref <5)

## 2016-03-14 LAB — POCT PREGNANCY, URINE: Preg Test, Ur: POSITIVE — AB

## 2016-03-14 NOTE — MAU Provider Note (Signed)
History     CSN: [id number]  Arrival date and time: 03/14/16 1650   First Provider Initiated Contact with Patient 03/14/16 1827      Chief Complaint  Patient presents with  . Possible Pregnancy  . Abdominal Pain   HPI   [name] is 25 y.o. female with a complicated medical history, G2P0010 at 4855w3d with a history of lupus erythematosus, chronic hypertension and pulmonary HTN (diagnosed in 2016) presents to MAU with a 3 day history of LLQ pain. The pain is described as crampy pain that comes and goes. Currently she rates her pain 5/10, she has not taken anything for the pain.   Patient is actively managed by rheumatology and pulmonology. Patient is on oxygen/ nasal canula at home when active only. Denies shortness of breath today. "feels well"  She denies vaginal bleeding.   OB History    Gravida Para Term Preterm AB TAB SAB Ectopic Multiple Living   2 0 0 0 1 0 1 0 0 0       Past Medical History  Diagnosis Date  . CAP (community acquired pneumonia) 01/07/2015  . GERD (gastroesophageal reflux disease)   . Daily headache     "sometimes" (01/08/2015)  . Arthritis     "hands and legs" (01/08/2015)  . Lupus (HCC)   . Lung disease     Past Surgical History  Procedure Laterality Date  . Finger surgery Right 03/2014    "laceration, nerve/artery injury" 2nd digit  . Video bronchoscopy Bilateral 01/12/2015    Procedure: VIDEO BRONCHOSCOPY WITH FLUORO;  Surgeon: Leslye Peerobert S Byrum, MD;  [facility];  Service: Cardiopulmonary;  Laterality: Bilateral;  . Dilation and evacuation N/A 07/13/2015    Procedure: DILATATION AND EVACUATION;  Surgeon: Levie HeritageJacob J Stinson, DO;  Location: [facility] ORS;  Service: Gynecology;  Laterality: N/A;  . Cardiac catheterization N/A 09/09/2015    Procedure: Right Heart Cath;  Surgeon: Laurey Moralealton S McLean, MD;  [facility];  Service: Cardiovascular;  Laterality: N/A;    Family History  Problem Relation Age of Onset  . Diabetes Mother    . Diabetes Maternal Aunt   . Diabetes Maternal Grandmother     Social History  Substance Use Topics  . Smoking status: Former Smoker -- 0.10 packs/day for 5 years    Types: Cigarettes    Quit date: 11/20/2014  . Smokeless tobacco: Never Used  . Alcohol Use: No     Comment: 01/08/2015 "last drink was New Year's Eve"    Allergies:  Allergies  Allergen Reactions  . Hydrocodone Nausea And Vomiting  . Zithromax [Azithromycin] Cough    Prescriptions prior to admission  Medication Sig Dispense Refill Last Dose  . amLODipine (NORVASC) 5 MG tablet Take 5 mg by mouth daily.   Past Week at Unknown time  . azaTHIOprine (IMURAN) 50 MG tablet Take 50 mg by mouth 3 (three) times daily.   03/13/2016 at Unknown time  . diphenhydrAMINE (BENADRYL) 25 mg capsule Take 25 mg by mouth every 8 (eight) hours as needed for itching or allergies.    Past Month at Unknown time  . metroNIDAZOLE (METROGEL VAGINAL) 0.75 % vaginal gel 1 applicator per vagina at HS x 7 then weekly for 4 (Patient taking differently: Place 1 Applicatorful vaginally at bedtime. 1 applicator per vagina at HS x 7 then weekly for 4) 140 g 0 03/13/2016 at Unknown time  . oxyCODONE-acetaminophen (PERCOCET) 7.5-325 MG tablet Take 1 tablet by mouth every 6 (six) hours  as needed for moderate pain or severe pain.   0 Past Week at Unknown time  . predniSONE (DELTASONE) 5 MG tablet Take 15 mg by mouth daily with breakfast. Take 3 tablets daily.   03/14/2016 at Unknown time  . medroxyPROGESTERone (DEPO-PROVERA) 150 MG/ML injection Inject 1 mL (150 mg total) into the muscle once. (Patient not taking: Reported on 03/14/2016) 1 mL 1    Results for orders placed or performed during the hospital encounter of 03/14/16 (from the past 48 hour(s))  Urinalysis, Routine w reflex microscopic (not at [facility])     Status: Abnormal   Collection Time: 03/14/16  5:18 PM  Result Value Ref Range   Color, Urine YELLOW YELLOW   APPearance HAZY (A) CLEAR   Specific  Gravity, Urine 1.010 1.005 - 1.030   pH 6.5 5.0 - 8.0   Glucose, UA NEGATIVE NEGATIVE mg/dL   Hgb urine dipstick NEGATIVE NEGATIVE   Bilirubin Urine NEGATIVE NEGATIVE   Ketones, ur NEGATIVE NEGATIVE mg/dL   Protein, ur NEGATIVE NEGATIVE mg/dL   Nitrite NEGATIVE NEGATIVE   Leukocytes, UA NEGATIVE NEGATIVE    Comment: MICROSCOPIC NOT DONE ON URINES WITH NEGATIVE PROTEIN, BLOOD, LEUKOCYTES, NITRITE, OR GLUCOSE <1000 mg/dL.  Pregnancy, urine POC     Status: Abnormal   Collection Time: 03/14/16  5:25 PM  Result Value Ref Range   Preg Test, Ur POSITIVE (A) NEGATIVE    Comment:        THE SENSITIVITY OF THIS METHODOLOGY IS >24 mIU/mL   hCG, quantitative, pregnancy     Status: Abnormal   Collection Time: 03/14/16  6:40 PM  Result Value Ref Range   hCG, Beta Chain, Quant, S 18527 (H) <5 mIU/mL    Comment:          GEST. AGE      CONC.  (mIU/mL)   <=1 WEEK        5 - 50     2 WEEKS       50 - 500     3 WEEKS       100 - 10,000     4 WEEKS     1,000 - 30,000     5 WEEKS     3,500 - 115,000   6-8 WEEKS     12,000 - 270,000    12 WEEKS     15,000 - 220,000        FEMALE AND NON-PREGNANT FEMALE:     LESS THAN 5 mIU/mL   CBC     Status: Abnormal   Collection Time: 03/14/16  6:41 PM  Result Value Ref Range   WBC 6.2 4.0 - 10.5 K/uL   RBC 5.36 (H) 3.87 - 5.11 MIL/uL   Hemoglobin 15.0 12.0 - 15.0 g/dL   HCT 16.1 09.6 - 04.5 %   MCV 80.8 78.0 - 100.0 fL   MCH 28.0 26.0 - 34.0 pg   MCHC 34.6 30.0 - 36.0 g/dL   RDW 40.9 81.1 - 91.4 %   Platelets 265 150 - 400 K/uL    Results for orders placed or performed during the hospital encounter of 03/14/16 (from the past 48 hour(s))  Urinalysis, Routine w reflex microscopic (not at [facility])     Status: Abnormal   Collection Time: 03/14/16  5:18 PM  Result Value Ref Range   Color, Urine YELLOW YELLOW   APPearance HAZY (A) CLEAR   Specific Gravity, Urine 1.010 1.005 - 1.030   pH 6.5 5.0 - 8.0  Glucose, UA NEGATIVE NEGATIVE mg/dL   Hgb urine  dipstick NEGATIVE NEGATIVE   Bilirubin Urine NEGATIVE NEGATIVE   Ketones, ur NEGATIVE NEGATIVE mg/dL   Protein, ur NEGATIVE NEGATIVE mg/dL   Nitrite NEGATIVE NEGATIVE   Leukocytes, UA NEGATIVE NEGATIVE    Comment: MICROSCOPIC NOT DONE ON URINES WITH NEGATIVE PROTEIN, BLOOD, LEUKOCYTES, NITRITE, OR GLUCOSE <1000 mg/dL.  Pregnancy, urine POC     Status: Abnormal   Collection Time: 03/14/16  5:25 PM  Result Value Ref Range   Preg Test, Ur POSITIVE (A) NEGATIVE    Comment:        THE SENSITIVITY OF THIS METHODOLOGY IS >24 mIU/mL   hCG, quantitative, pregnancy     Status: Abnormal   Collection Time: 03/14/16  6:40 PM  Result Value Ref Range   hCG, Beta Chain, Quant, S 18527 (H) <5 mIU/mL    Comment:          GEST. AGE      CONC.  (mIU/mL)   <=1 WEEK        5 - 50     2 WEEKS       50 - 500     3 WEEKS       100 - 10,000     4 WEEKS     1,000 - 30,000     5 WEEKS     3,500 - 115,000   6-8 WEEKS     12,000 - 270,000    12 WEEKS     15,000 - 220,000        FEMALE AND NON-PREGNANT FEMALE:     LESS THAN 5 mIU/mL   CBC     Status: Abnormal   Collection Time: 03/14/16  6:41 PM  Result Value Ref Range   WBC 6.2 4.0 - 10.5 K/uL   RBC 5.36 (H) 3.87 - 5.11 MIL/uL   Hemoglobin 15.0 12.0 - 15.0 g/dL   HCT 11.9 14.7 - 82.9 %   MCV 80.8 78.0 - 100.0 fL   MCH 28.0 26.0 - 34.0 pg   MCHC 34.6 30.0 - 36.0 g/dL   RDW 56.2 13.0 - 86.5 %   Platelets 265 150 - 400 K/uL   US Ob Comp Less 14 Wks  03/14/2016  CLINICAL DATA:  Pregnant patient with left lower quadrant pain for 2- 3 days. EXAM: OBSTETRIC <14 WK [place] AND TRANSVAGINAL OB US TECHNIQUE: Both transabdominal and transvaginal ultrasound examinations were performed for complete evaluation of the gestation as well as the maternal uterus, adnexal regions, and pelvic cul-de-sac. Transvaginal technique was performed to assess early pregnancy. COMPARISON:  Pelvic ultrasound 09/25/2015 FINDINGS: Intrauterine gestational sac: Present Yolk sac:  Present  Embryo:  Not present Cardiac Activity: Not present MSD: 12.9  mm   6 w   1  d Subchorionic hemorrhage:  Tiny subchorionic hemorrhage. Maternal uterus/adnexae: There is a corpus luteum within the left ovary. Right ovary is normal. Moderate amount of free fluid in the pelvis. IMPRESSION: Intrauterine gestational sac and yolk sac without fetal pole identified. Recommend follow-up quantitative B-HCG levels and follow-up US in 14 days to confirm and assess viability. This recommendation follows SRU consensus guidelines: Diagnostic Criteria for Nonviable Pregnancy Early in the First Trimester. Malva Limes Med 2013; 784:6962-95. Moderate free fluid in the pelvis. Tiny subchorionic hemorrhage. Electronically Signed   By: Annia Belt M.D.   On: 03/14/2016 19:40   US Ob Transvaginal  03/14/2016  CLINICAL DATA:  Pregnant patient with left lower quadrant  pain for 2- 3 days. EXAM: OBSTETRIC <14 WK [place] AND TRANSVAGINAL OB US TECHNIQUE: Both transabdominal and transvaginal ultrasound examinations were performed for complete evaluation of the gestation as well as the maternal uterus, adnexal regions, and pelvic cul-de-sac. Transvaginal technique was performed to assess early pregnancy. COMPARISON:  Pelvic ultrasound 09/25/2015 FINDINGS: Intrauterine gestational sac: Present Yolk sac:  Present Embryo:  Not present Cardiac Activity: Not present MSD: 12.9  mm   6 w   1  d Subchorionic hemorrhage:  Tiny subchorionic hemorrhage. Maternal uterus/adnexae: There is a corpus luteum within the left ovary. Right ovary is normal. Moderate amount of free fluid in the pelvis. IMPRESSION: Intrauterine gestational sac and yolk sac without fetal pole identified. Recommend follow-up quantitative B-HCG levels and follow-up US in 14 days to confirm and assess viability. This recommendation follows SRU consensus guidelines: Diagnostic Criteria for Nonviable Pregnancy Early in the First Trimester. Malva Limes Med 2013; 161:0960-45. Moderate free fluid in  the pelvis. Tiny subchorionic hemorrhage. Electronically Signed   By: Annia Belt M.D.   On: 03/14/2016 19:40    Review of Systems  Constitutional: Negative for fever and chills.  Respiratory: Negative for shortness of breath.   Gastrointestinal: Positive for abdominal pain. Negative for nausea, vomiting, diarrhea and constipation.  Genitourinary: Negative for dysuria and urgency.   Physical Exam   Blood pressure 119/77, pulse 87, temperature 98.3 F (36.8 C), temperature source Oral, resp. rate 20, last menstrual period 02/05/2016.  Physical Exam  Constitutional: She is oriented to person, place, and time. Vital signs are normal. She appears well-developed and well-nourished.  Non-toxic appearance. She does not have a sickly appearance. She does not appear ill. No distress.  HENT:  Head: Normocephalic.  Respiratory: Effort normal. No respiratory distress.  GI: Soft. She exhibits no distension and no mass. There is no tenderness. There is no rebound and no guarding.  Genitourinary:  Bimanual exam: Cervix closed, no CMT  Uterus non tender, normal size Adnexa non tender, no masses bilaterally Chaperone present for exam.  Musculoskeletal: Normal range of motion.  Neurological: She is alert and oriented to person, place, and time.  Skin: Skin is warm. She is not diaphoretic.  Psychiatric: Her behavior is normal.    MAU Course  Procedures  None  MDM  CBC HCG HIV [place] Wet prep- Done by PCP recently GC collected off UA- Pending   O2 saturation 97%  Assessment and Plan   A:  1. Encounter for pregnancy test, result positive   2. Abdominal pain in pregnancy, antepartum   3. Subchorionic hematoma in first trimester   4. Chronic hypertension     P:  Discharge home in stable condition Continue Norvasc Referral to [facility] sent; patient to be scheduled and called Contact rheumatology and notify them of pregnancy status Prenatal vitamins daily Return to MAU with any vaginal  bleeding or increase in abdominal pain First trimester warning signs.    Duane Lope, NP 03/14/2016 8:29 PM

## 2016-03-14 NOTE — Discharge Instructions (Signed)
Subchorionic Hematoma A subchorionic hematoma is a gathering of blood between the outer wall of the placenta and the inner wall of the womb (uterus). The placenta is the organ that connects the fetus to the wall of the uterus. The placenta performs the feeding, breathing (oxygen to the fetus), and waste removal (excretory work) of the fetus.  Subchorionic hematoma is the most common abnormality found on a result from ultrasonography done during the first trimester or early second trimester of pregnancy. If there has been little or no vaginal bleeding, early small hematomas usually shrink on their own and do not affect your baby or pregnancy. The blood is gradually absorbed over 1-2 weeks. When bleeding starts later in pregnancy or the hematoma is larger or occurs in an older pregnant woman, the outcome may not be as good. Larger hematomas may get bigger, which increases the chances for miscarriage. Subchorionic hematoma also increases the risk of premature detachment of the placenta from the uterus, preterm (premature) labor, and stillbirth. HOME CARE INSTRUCTIONS  Stay on bed rest if your health care provider recommends this. Although bed rest will not prevent more bleeding or prevent a miscarriage, your health care provider may recommend bed rest until you are advised otherwise.  Avoid heavy lifting (more than 10 lb [4.5 kg]), exercise, sexual intercourse, or douching as directed by your health care provider.  Keep track of the number of pads you use each day and how soaked (saturated) they are. Write down this information.  Do not use tampons.  Keep all follow-up appointments as directed by your health care provider. Your health care provider may ask you to have follow-up blood tests or ultrasound tests or both. SEEK IMMEDIATE MEDICAL CARE IF:  You have severe cramps in your stomach, back, abdomen, or pelvis.  You have a fever.  You pass large clots or tissue. Save any tissue for your health  care provider to look at.  Your bleeding increases or you become lightheaded, feel weak, or have fainting episodes.   This information is not intended to replace advice given to you by your health care provider. Make sure you discuss any questions you have with your health care provider.   Document Released: 02/22/2007 Document Revised: 11/28/2014 Document Reviewed: 06/06/2013 Elsevier Interactive Patient Education 2016 Elsevier Inc. Abdominal Pain During Pregnancy Abdominal pain is common in pregnancy. Most of the time, it does not cause harm. There are many causes of abdominal pain. Some causes are more serious than others. Some of the causes of abdominal pain in pregnancy are easily diagnosed. Occasionally, the diagnosis takes time to understand. Other times, the cause is not determined. Abdominal pain can be a sign that something is very wrong with the pregnancy, or the pain may have nothing to do with the pregnancy at all. For this reason, always tell your health care provider if you have any abdominal discomfort. HOME CARE INSTRUCTIONS  Monitor your abdominal pain for any changes. The following actions may help to alleviate any discomfort you are experiencing:  Do not have sexual intercourse or put anything in your vagina until your symptoms go away completely.  Get plenty of rest until your pain improves.  Drink clear fluids if you feel nauseous. Avoid solid food as long as you are uncomfortable or nauseous.  Only take over-the-counter or prescription medicine as directed by your health care provider.  Keep all follow-up appointments with your health care provider. SEEK IMMEDIATE MEDICAL CARE IF:  You are bleeding, leaking fluid, or  passing tissue from the vagina.  You have increasing pain or cramping.  You have persistent vomiting.  You have painful or bloody urination.  You have a fever.  You notice a decrease in your baby's movements.  You have extreme weakness or feel  faint.  You have shortness of breath, with or without abdominal pain.  You develop a severe headache with abdominal pain.  You have abnormal vaginal discharge with abdominal pain.  You have persistent diarrhea.  You have abdominal pain that continues even after rest, or gets worse. MAKE SURE YOU:   Understand these instructions.  Will watch your condition.  Will get help right away if you are not doing well or get worse.   This information is not intended to replace advice given to you by your health care provider. Make sure you discuss any questions you have with your health care provider.   Document Released: 11/07/2005 Document Revised: 08/28/2013 Document Reviewed: 06/06/2013 Elsevier Interactive Patient Education [employer]2016 Elsevier Inc.

## 2016-03-14 NOTE — MAU Note (Signed)
+  HPT today.  Having pain in LLQ, started 2-3 days ago.  Currently being treated for BV

## 2016-03-15 LAB — HIV ANTIBODY (ROUTINE TESTING W REFLEX): HIV Screen 4th Generation wRfx: NONREACTIVE

## 2016-04-14 ENCOUNTER — Ambulatory Visit (INDEPENDENT_AMBULATORY_CARE_PROVIDER_SITE_OTHER): Payer: BLUE CROSS/BLUE SHIELD | Admitting: Family Medicine

## 2016-04-14 ENCOUNTER — Other Ambulatory Visit: Payer: Self-pay | Admitting: Family Medicine

## 2016-04-14 ENCOUNTER — Encounter: Payer: Self-pay | Admitting: Family Medicine

## 2016-04-14 VITALS — BP 117/76 | HR 119 | Temp 98.9°F | Wt 164.2 lb

## 2016-04-14 DIAGNOSIS — I272 Other secondary pulmonary hypertension: Secondary | ICD-10-CM

## 2016-04-14 DIAGNOSIS — O0991 Supervision of high risk pregnancy, unspecified, first trimester: Secondary | ICD-10-CM

## 2016-04-14 DIAGNOSIS — Z113 Encounter for screening for infections with a predominantly sexual mode of transmission: Secondary | ICD-10-CM | POA: Diagnosis not present

## 2016-04-14 DIAGNOSIS — O9989 Other specified diseases and conditions complicating pregnancy, childbirth and the puerperium: Secondary | ICD-10-CM

## 2016-04-14 DIAGNOSIS — O26899 Other specified pregnancy related conditions, unspecified trimester: Secondary | ICD-10-CM

## 2016-04-14 DIAGNOSIS — M329 Systemic lupus erythematosus, unspecified: Secondary | ICD-10-CM

## 2016-04-14 DIAGNOSIS — O10912 Unspecified pre-existing hypertension complicating pregnancy, second trimester: Secondary | ICD-10-CM

## 2016-04-14 DIAGNOSIS — J9611 Chronic respiratory failure with hypoxia: Secondary | ICD-10-CM

## 2016-04-14 DIAGNOSIS — O99891 Other specified diseases and conditions complicating pregnancy: Secondary | ICD-10-CM

## 2016-04-14 DIAGNOSIS — O10919 Unspecified pre-existing hypertension complicating pregnancy, unspecified trimester: Secondary | ICD-10-CM | POA: Insufficient documentation

## 2016-04-14 LAB — HEMOGLOBIN A1C
Hgb A1c MFr Bld: 6 % — ABNORMAL HIGH (ref <5.7)
Mean Plasma Glucose: 126 mg/dL

## 2016-04-14 LAB — POCT URINALYSIS DIP (DEVICE)
Glucose, UA: NEGATIVE mg/dL
NITRITE: NEGATIVE
PH: 6.5 (ref 5.0–8.0)
Protein, ur: 100 mg/dL — AB
Specific Gravity, Urine: 1.02 (ref 1.005–1.030)
Urobilinogen, UA: 1 mg/dL (ref 0.0–1.0)

## 2016-04-14 MED ORDER — FLUCONAZOLE 150 MG PO TABS: 150.0000 mg | ORAL_TABLET | Freq: Once | ORAL | Status: DC

## 2016-04-14 NOTE — Progress Notes (Signed)
Nuchal scheduled for 06/08 @ 8am

## 2016-04-14 NOTE — Progress Notes (Signed)
Nutrition note: 1st visit consult Pt has h/o obesity, Lupus, &  HTN. Pt has lost 5.8# @ 1551w4d, which pt states is probably due to her nausea. Pt reports eating 2-3 meals & 1-2 snacks/d. Pt is taking a PNV. Pt reports having nausea & heartburn but no vomiting. Pt reports craving ice, which she stated she consumes daily & starch, which she states she consumes ~1/wk. Pt is unsure about BF because she is unsure if she can with the medications she takes. Pt received verbal & written education on general nutrition during pregnancy. Discussed tips to decrease nausea & heartburn. Encouraged protein with all meals & snacks. Encouraged pt to decrease her ice & starch intake. Discussed the medications with Dr Adrian BlackwaterStinson after reviewing Medications & Mothers' Milk: he stated that BF is compatible with Norvasc & Prednisone but was unsure about Imuran but after discussing [facility], they stated it was safe with BF. This information was passed along to the pt to let her know that it is safe to BF on these medications per the doctor. Discussed wt gain goals of 11-20# or 0.5#/wk. Pt agrees to try to consume more protein rich foods. Pt does not have WIC but plans to apply.  F/u as needed Blondell RevealLaura Chalon Zobrist, MS, RD, LDN, [facility]

## 2016-04-14 NOTE — Progress Notes (Signed)
Subjective:    Kayla [name] is a G2P0010 5944w4d being seen today for her first obstetrical visit.  Her obstetrical history is significant for CHTN, Lupus, Pulmonary HTN, chronic respiratory failure on home O2. Patient does intend to breast feed. Pregnancy history fully reviewed.  Patient reports nausea and clumpy white vaginal discharge and vaginal itching after taking antibiotics for UTI.  Finished antibiotics 5 days ago.  Was on nitrofurantoin x7days.  Filed Vitals:   04/14/16 0820  BP: 117/76  Pulse: 119  Temp: 98.9 F (37.2 C)  Weight: 164 lb 3.2 oz (74.481 kg)    HISTORY: OB History  Gravida Para Term Preterm AB SAB TAB Ectopic Multiple Living  2 0 0 0 1 1 0 0 0 0     # Outcome Date GA Lbr Len/2nd Weight Sex Delivery Anes PTL Lv  2 Current           1 SAB 06/2015 1766w0d            Past Medical History  Diagnosis Date  . CAP (community acquired pneumonia) 01/07/2015  . GERD (gastroesophageal reflux disease)   . Daily headache     "sometimes" (01/08/2015)  . Arthritis     "hands and legs" (01/08/2015)  . Lupus (HCC)   . Lung disease   . Pulmonary hypertension (HCC)   . Hypertension    Past Surgical History  Procedure Laterality Date  . Finger surgery Right 03/2014    "laceration, nerve/artery injury" 2nd digit  . Video bronchoscopy Bilateral 01/12/2015    Procedure: VIDEO BRONCHOSCOPY WITH FLUORO;  Surgeon: Leslye Peerobert S Byrum, MD;  [facility];  Service: Cardiopulmonary;  Laterality: Bilateral;  . Dilation and evacuation N/A 07/13/2015    Procedure: DILATATION AND EVACUATION;  Surgeon: Levie HeritageJacob J Shundra Wirsing, DO;  Location: [facility] ORS;  Service: Gynecology;  Laterality: N/A;  . Cardiac catheterization N/A 09/09/2015    Procedure: Right Heart Cath;  Surgeon: Laurey Moralealton S McLean, MD;  [facility];  Service: Cardiovascular;  Laterality: N/A;   Family History  Problem Relation Age of Onset  . Diabetes Mother   . Diabetes Maternal Aunt   . Diabetes Maternal  Grandmother      Exam    Uterus:     Pelvic Exam:    Perineum: No Hemorrhoids, Normal Perineum   Vulva: normal, Bartholin's, Urethra, Skene's normal   Vagina:  normal mucosa, curdlike discharge   Cervix: no cervical motion tenderness and no lesions   Adnexa: normal adnexa and no mass, fullness, tenderness   Bony Pelvis: gynecoid  System:     Skin: normal coloration and turgor, no rashes    Neurologic: oriented, normal, gait normal; reflexes normal and symmetric   Extremities: normal strength, tone, and muscle mass   HEENT PERRLA and extra ocular movement intact   Mouth/Teeth mucous membranes moist, pharynx normal without lesions   Neck supple and no masses   Cardiovascular: regular rate and rhythm, no murmurs or gallops   Respiratory:  appears well, vitals normal, no respiratory distress, acyanotic, normal RR, ear and throat exam is normal, neck free of mass or lymphadenopathy, chest clear, no wheezing, crepitations, rhonchi, normal symmetric air entry   Abdomen: soft, non-tender; bowel sounds normal; no masses,  no organomegaly   Urinary: urethral meatus normal      Assessment:    Pregnancy: G2P0010 Patient Active Problem List   Diagnosis Date Noted  . Supervision of high risk pregnancy, antepartum 07/06/2015    Priority: Medium  .  Systemic lupus erythematosus (SLE) affecting pregnancy, antepartum (HCC) 07/06/2015    Priority: Medium  . Chronic respiratory failure with hypoxia (HCC) 05/21/2015    Priority: Medium  . Chronic hypertension during pregnancy, antepartum 04/14/2016  . Chronic hypertension 03/14/2016  . Pulmonary hypertension (HCC) 09/04/2015  . Lupus (systemic lupus erythematosus) (HCC) 08/21/2015  . Other chest pain 08/21/2015  . Loud P2 (pulmonary S2, second heart sound) 08/21/2015  . Encounter for long-term (current) use of high-risk medication 08/21/2015  . Insomnia 08/21/2015  . Connective tissue disease, undifferentiated (HCC) 05/08/2015  .  Pneumomediastinum (HCC) 04/14/2015  . ILD (interstitial lung disease) (HCC) 02/06/2015  . Dyspnea 02/06/2015  . Malar rash 01/09/2015  . Alopecia 01/09/2015  . Arthralgia 01/09/2015  . Protein-calorie malnutrition, severe (HCC) 01/08/2015        Plan:  1. Supervision of high risk pregnancy, antepartum, first trimester Initial Labs done.  First screen ordered.  Diflucan  2. Chronic hypertension during pregnancy, antepartum Discussed 24 hr Urine.  Antenatal testing, serial [place], starting ASA  at 12 weeks. - Prenatal Profile - Hemoglobinopathy evaluation - Culture, OB Urine - Prescript Monitor Profile(19) - [place] MFM Fetal Nuchal Translucency; Future - GC/Chlamydia probe amp ([place])not at [facility] - Sjogren's syndrome antibods(ssa + ssb) - Hemoglobin A1C  3. Systemic lupus erythematosus (SLE) affecting pregnancy, antepartum (HCC) Sjogren's antibodies.  Will need referral to MFM.  Patient currently on chronic prednisone.  Has had no flares at this point.  Seeing rheumatology tomorrow.    4. Pulmonary hypertension (HCC) Pt followed by pulmonology.  Has appt tomorrow.  5. Chronic respiratory failure with hypoxia (HCC) Continue home O2  50% of 30 min visit spent on counseling and coordination of care.     Candelaria Celeste JEHIEL 04/14/2016

## 2016-04-14 NOTE — Patient Instructions (Signed)
First Trimester of Pregnancy The first trimester of pregnancy is from week 1 until the end of week 12 (months 1 through 3). A week after a sperm fertilizes an egg, the egg will implant on the wall of the uterus. This embryo will begin to develop into a baby. Genes from you and your partner are forming the baby. The female genes determine whether the baby is a boy or a girl. At 6-8 weeks, the eyes and face are formed, and the heartbeat can be seen on ultrasound. At the end of 12 weeks, all the baby's organs are formed.  Now that you are pregnant, you will want to do everything you can to have a healthy baby. Two of the most important things are to get good prenatal care and to follow your health care provider's instructions. Prenatal care is all the medical care you receive before the baby's birth. This care will help prevent, find, and treat any problems during the pregnancy and childbirth. BODY CHANGES Your body goes through many changes during pregnancy. The changes vary from woman to woman.   You may gain or lose a couple of pounds at first.  You may feel sick to your stomach (nauseous) and throw up (vomit). If the vomiting is uncontrollable, call your health care provider.  You may tire easily.  You may develop headaches that can be relieved by medicines approved by your health care provider.  You may urinate more often. Painful urination may mean you have a bladder infection.  You may develop heartburn as a result of your pregnancy.  You may develop constipation because certain hormones are causing the muscles that push waste through your intestines to slow down.  You may develop hemorrhoids or swollen, bulging veins (varicose veins).  Your breasts may begin to grow larger and become tender. Your nipples may stick out more, and the tissue that surrounds them (areola) may become darker.  Your gums may bleed and may be sensitive to brushing and flossing.  Dark spots or blotches (chloasma,  mask of pregnancy) may develop on your face. This will likely fade after the baby is born.  Your menstrual periods will stop.  You may have a loss of appetite.  You may develop cravings for certain kinds of food.  You may have changes in your emotions from day to day, such as being excited to be pregnant or being concerned that something may go wrong with the pregnancy and baby.  You may have more vivid and strange dreams.  You may have changes in your hair. These can include thickening of your hair, rapid growth, and changes in texture. Some women also have hair loss during or after pregnancy, or hair that feels dry or thin. Your hair will most likely return to normal after your baby is born. WHAT TO EXPECT AT YOUR PRENATAL VISITS During a routine prenatal visit:  You will be weighed to make sure you and the baby are growing normally.  Your blood pressure will be taken.  Your abdomen will be measured to track your baby's growth.  The fetal heartbeat will be listened to starting around week 10 or 12 of your pregnancy.  Test results from any previous visits will be discussed. Your health care provider may ask you:  How you are feeling.  If you are feeling the baby move.  If you have had any abnormal symptoms, such as leaking fluid, bleeding, severe headaches, or abdominal cramping.  If you are using any tobacco products,   including cigarettes, chewing tobacco, and electronic cigarettes.  If you have any questions. Other tests that may be performed during your first trimester include:  Blood tests to find your blood type and to check for the presence of any previous infections. They will also be used to check for low iron levels (anemia) and Rh antibodies. Later in the pregnancy, blood tests for diabetes will be done along with other tests if problems develop.  Urine tests to check for infections, diabetes, or protein in the urine.  An ultrasound to confirm the proper growth  and development of the baby.  An amniocentesis to check for possible genetic problems.  Fetal screens for spina bifida and Down syndrome.  You may need other tests to make sure you and the baby are doing well.  HIV (human immunodeficiency virus) testing. Routine prenatal testing includes screening for HIV, unless you choose not to have this test. HOME CARE INSTRUCTIONS  Medicines  Follow your health care provider's instructions regarding medicine use. Specific medicines may be either safe or unsafe to take during pregnancy.  Take your prenatal vitamins as directed.  If you develop constipation, try taking a stool softener if your health care provider approves. Diet  Eat regular, well-balanced meals. Choose a variety of foods, such as meat or vegetable-based protein, fish, milk and low-fat dairy products, vegetables, fruits, and whole grain breads and cereals. Your health care provider will help you determine the amount of weight gain that is right for you.  Avoid raw meat and uncooked cheese. These carry germs that can cause birth defects in the baby.  Eating four or five small meals rather than three large meals a day may help relieve nausea and vomiting. If you start to feel nauseous, eating a few soda crackers can be helpful. Drinking liquids between meals instead of during meals also seems to help nausea and vomiting.  If you develop constipation, eat more high-fiber foods, such as fresh vegetables or fruit and whole grains. Drink enough fluids to keep your urine clear or pale yellow. Activity and Exercise  Exercise only as directed by your health care provider. Exercising will help you:  Control your weight.  Stay in shape.  Be prepared for labor and delivery.  Experiencing pain or cramping in the lower abdomen or low back is a good sign that you should stop exercising. Check with your health care provider before continuing normal exercises.  Try to avoid standing for long  periods of time. Move your legs often if you must stand in one place for a long time.  Avoid heavy lifting.  Wear low-heeled shoes, and practice good posture.  You may continue to have sex unless your health care provider directs you otherwise. Relief of Pain or Discomfort  Wear a good support bra for breast tenderness.   Take warm sitz baths to soothe any pain or discomfort caused by hemorrhoids. Use hemorrhoid cream if your health care provider approves.   Rest with your legs elevated if you have leg cramps or low back pain.  If you develop varicose veins in your legs, wear support hose. Elevate your feet for 15 minutes, 3-4 times a day. Limit salt in your diet. Prenatal Care  Schedule your prenatal visits by the twelfth week of pregnancy. They are usually scheduled monthly at first, then more often in the last 2 months before delivery.  Write down your questions. Take them to your prenatal visits.  Keep all your prenatal visits as directed by your   health care provider. Safety  Wear your seat belt at all times when driving.  Make a list of emergency phone numbers, including numbers for family, friends, the hospital, and police and fire departments. General Tips  Ask your health care provider for a referral to a local prenatal education class. Begin classes no later than at the beginning of month 6 of your pregnancy.  Ask for help if you have counseling or nutritional needs during pregnancy. Your health care provider can offer advice or refer you to specialists for help with various needs.  Do not use hot tubs, steam rooms, or saunas.  Do not douche or use tampons or scented sanitary pads.  Do not cross your legs for long periods of time.  Avoid cat litter boxes and soil used by cats. These carry germs that can cause birth defects in the baby and possibly loss of the fetus by miscarriage or stillbirth.  Avoid all smoking, herbs, alcohol, and medicines not prescribed by  your health care provider. Chemicals in these affect the formation and growth of the baby.  Do not use any tobacco products, including cigarettes, chewing tobacco, and electronic cigarettes. If you need help quitting, ask your health care provider. You may receive counseling support and other resources to help you quit.  Schedule a dentist appointment. At home, brush your teeth with a soft toothbrush and be gentle when you floss. SEEK MEDICAL CARE IF:   You have dizziness.  You have mild pelvic cramps, pelvic pressure, or nagging pain in the abdominal area.  You have persistent nausea, vomiting, or diarrhea.  You have a bad smelling vaginal discharge.  You have pain with urination.  You notice increased swelling in your face, hands, legs, or ankles. SEEK IMMEDIATE MEDICAL CARE IF:   You have a fever.  You are leaking fluid from your vagina.  You have spotting or bleeding from your vagina.  You have severe abdominal cramping or pain.  You have rapid weight gain or loss.  You vomit blood or material that looks like coffee grounds.  You are exposed to German measles and have never had them.  You are exposed to fifth disease or chickenpox.  You develop a severe headache.  You have shortness of breath.  You have any kind of trauma, such as from a fall or a car accident.   This information is not intended to replace advice given to you by your health care provider. Make sure you discuss any questions you have with your health care provider.   Document Released: 11/01/2001 Document Revised: 11/28/2014 Document Reviewed: 09/17/2013 Elsevier Interactive Patient Education 2016 [employer]  

## 2016-04-14 NOTE — Progress Notes (Signed)
Here for Initial prenatal visit. Oxygen saturation 97%. States thinks she has a yeast infection- just on antibiotics for UTI. C/o thick , white vaginal discharge and vaginal itching. Also c/o scant red blood on tissue when went to bathroom once.  Given new patient information. Declines early glucose screening due to nausea today. Large leukocytes noted on urinalysis.

## 2016-04-15 LAB — PRESCRIPTION MONITORING PROFILE (19 PANEL)
AMPHETAMINE/METH: NEGATIVE ng/mL
BARBITURATE SCREEN, URINE: NEGATIVE ng/mL
BUPRENORPHINE, URINE: NEGATIVE ng/mL
Benzodiazepine Screen, Urine: NEGATIVE ng/mL
CANNABINOID SCRN UR: NEGATIVE ng/mL
CARISOPRODOL, URINE: NEGATIVE ng/mL
COCAINE METABOLITES: NEGATIVE ng/mL
CREATININE, URINE: 436.45 mg/dL (ref >20.0)
ECSTASY: NEGATIVE ng/mL
Fentanyl, Ur: NEGATIVE ng/mL
MEPERIDINE UR: NEGATIVE ng/mL
METHADONE SCREEN, URINE: NEGATIVE ng/mL
METHAQUALONE SCREEN (URINE): NEGATIVE ng/mL
Nitrites, Initial: NEGATIVE ug/mL
OXYCODONE SCRN UR: NEGATIVE ng/mL
Opiate Screen, Urine: NEGATIVE ng/mL
PH URINE, INITIAL: 7.4 pH (ref 4.5–8.9)
PHENCYCLIDINE, UR: NEGATIVE ng/mL
PROPOXYPHENE: NEGATIVE ng/mL
TRAMADOL UR: NEGATIVE ng/mL
Tapentadol, urine: NEGATIVE ng/mL
Zolpidem, Urine: NEGATIVE ng/mL

## 2016-04-15 LAB — GC/CHLAMYDIA PROBE AMP (~~LOC~~) NOT AT ARMC
Chlamydia: NEGATIVE
Neisseria Gonorrhea: NEGATIVE

## 2016-04-15 LAB — SJOGREN'S SYNDROME ANTIBODS(SSA + SSB)
SSA (RO) (ENA) ANTIBODY, IGG: POSITIVE — AB
SSB (La) (ENA) Antibody, IgG: >8 — ABNORMAL HIGH

## 2016-04-16 LAB — CULTURE, OB URINE

## 2016-04-16 LAB — PRENATAL PROFILE (SOLSTAS)
Antibody Screen: NEGATIVE
BASOS PCT: 0 %
Basophils Absolute: 0 cells/uL (ref 0–200)
EOS ABS: 82 {cells}/uL (ref 15–500)
Eosinophils Relative: 2 %
HEMATOCRIT: 41.6 % (ref 35.0–45.0)
HEP B S AG: NEGATIVE
HIV: NONREACTIVE
Hemoglobin: 14.3 g/dL (ref 11.7–15.5)
LYMPHS ABS: 697 {cells}/uL — AB (ref 850–3900)
LYMPHS PCT: 17 %
MCH: 27.2 pg (ref 27.0–33.0)
MCHC: 34.4 g/dL (ref 32.0–36.0)
MCV: 79.1 fL — AB (ref 80.0–100.0)
MONO ABS: 656 {cells}/uL (ref 200–950)
MPV: 9.8 fL (ref 7.5–12.5)
Monocytes Relative: 16 %
NEUTROS PCT: 65 %
Neutro Abs: 2665 cells/uL (ref 1500–7800)
PLATELETS: 201 10*3/uL (ref 140–400)
RBC: 5.26 MIL/uL — AB (ref 3.80–5.10)
RDW: 13.1 % (ref 11.0–15.0)
Rh Type: POSITIVE
Rubella: 9.64 Index — ABNORMAL HIGH (ref <0.90)
WBC: 4.1 10*3/uL (ref 3.8–10.8)

## 2016-04-16 LAB — PAIN MGMT, COCAINE MET. W/CONF, U: COCAINE METABOLITE: NEGATIVE ng/mL (ref <150)

## 2016-04-16 LAB — PAIN MGMT, BARBITURATES W/CONF, U: Barbiturates: NEGATIVE ng/mL (ref <300)

## 2016-04-16 LAB — PAIN MGMT, MARIJUANA W/CONF, U: MARIJUANA METABOLITE: NEGATIVE ng/mL (ref <20)

## 2016-04-16 LAB — PAIN MGMT, BENZOS W/CONF, U: Benzodiazepines: NEGATIVE ng/mL (ref <100)

## 2016-04-16 LAB — PAIN MGMT, AMPHETAM. W/CONF, U: Amphetamines: NEGATIVE ng/mL (ref <500)

## 2016-04-16 LAB — PAIN MGMT, METHADONE W/CONF, U: METHADONE METABOLITE: NEGATIVE ng/mL (ref <100)

## 2016-04-16 LAB — PAIN MGMT, OPIATES W/CONF, U: Opiates: NEGATIVE ng/mL (ref <100)

## 2016-04-16 LAB — PAIN MGMT, BUP CONF W/ NALOX, U: BUPRENORPHINE: NEGATIVE ng/mL (ref <5)

## 2016-04-16 LAB — PAIN MGMT, PROPOXYPHENE W/CONF, U: PROPOXYPHENE: NEGATIVE ng/mL (ref <300)

## 2016-04-16 LAB — PAIN MGMT, OXYCODONE W/CONF, U: Oxycodone: NEGATIVE ng/mL (ref <100)

## 2016-04-16 LAB — PAIN MGMT, HEROIN MET. W/CONF, U: 6 Acetylmorphine: NEGATIVE ng/mL (ref <10)

## 2016-04-19 ENCOUNTER — Encounter: Payer: Self-pay | Admitting: Family Medicine

## 2016-04-19 DIAGNOSIS — R8271 Bacteriuria: Secondary | ICD-10-CM | POA: Insufficient documentation

## 2016-04-19 LAB — HEMOGLOBINOPATHY EVALUATION
Hemoglobin Other: 0 %
Hgb A2 Quant: 3.1 % (ref 1.8–3.5)
Hgb A: 96.9 % (ref >96.0)
Hgb F Quant: 0 % (ref <2.0)
Hgb S Quant: 0 %

## 2016-04-20 ENCOUNTER — Encounter (HOSPITAL_COMMUNITY): Payer: Self-pay | Admitting: Family Medicine

## 2016-04-20 ENCOUNTER — Telehealth: Payer: Self-pay | Admitting: *Deleted

## 2016-04-20 LAB — PAIN MGMT, FENTANYL QN, U
Fentanyl: NEGATIVE ng/mL
Norfentanyl: NEGATIVE ng/mL

## 2016-04-20 LAB — PAIN MGMT, MEPERIDINE QN, U
Meperidine: NEGATIVE ng/mL
Normeperidine: NEGATIVE ng/mL

## 2016-04-20 LAB — PAIN MGMT, TRAMADOL QN, U
Desmethyltramadol: NEGATIVE ng/mL (ref <100)
Tramadol: NEGATIVE ng/mL (ref <100)

## 2016-04-20 LAB — PAIN MGMT, CARISOPRODOL MET. QN, U: MEPROBAMATE: NEGATIVE ng/mL (ref <1000)

## 2016-04-20 LAB — ZOLPIDEM QN, U
ZOLPIDEM METABOLITE: NEGATIVE ng/mL
ZOLPIDEM: NEGATIVE ng/mL

## 2016-04-20 NOTE — Telephone Encounter (Signed)
-----   Message from Levie HeritageJacob J Stinson, DO sent at 04/19/2016  6:11 AM EDT ----- HgA1c 6.0 - Needs appt with Diabetic RN

## 2016-04-20 NOTE — Telephone Encounter (Signed)
Called pt and advised that a lab test from last week indicates she may have undiagnosed diabetes. She needs to meet with Diabetes Educator to learn how to check her blood sugar. Pt voiced understanding and agreed to appt on 6/12 @ 0830.

## 2016-04-21 ENCOUNTER — Encounter: Payer: Self-pay | Admitting: *Deleted

## 2016-04-28 ENCOUNTER — Encounter (HOSPITAL_COMMUNITY): Payer: Self-pay

## 2016-04-28 ENCOUNTER — Ambulatory Visit (HOSPITAL_COMMUNITY)
Admission: RE | Admit: 2016-04-28 | Discharge: 2016-04-28 | Disposition: A | Discharge status: Home / Self Care | Payer: BLUE CROSS/BLUE SHIELD | Source: Ambulatory Visit | Attending: Family Medicine | Admitting: Family Medicine

## 2016-04-28 ENCOUNTER — Other Ambulatory Visit: Payer: Self-pay | Admitting: Family Medicine

## 2016-04-28 VITALS — BP 113/81 | HR 116 | Wt 167.2 lb

## 2016-04-28 DIAGNOSIS — M329 Systemic lupus erythematosus, unspecified: Secondary | ICD-10-CM | POA: Diagnosis not present

## 2016-04-28 DIAGNOSIS — O10919 Unspecified pre-existing hypertension complicating pregnancy, unspecified trimester: Secondary | ICD-10-CM

## 2016-04-28 DIAGNOSIS — Z36 Encounter for antenatal screening of mother: Secondary | ICD-10-CM | POA: Insufficient documentation

## 2016-04-28 DIAGNOSIS — O10011 Pre-existing essential hypertension complicating pregnancy, first trimester: Secondary | ICD-10-CM | POA: Insufficient documentation

## 2016-04-28 DIAGNOSIS — Z3682 Encounter for antenatal screening for nuchal translucency: Secondary | ICD-10-CM

## 2016-04-28 DIAGNOSIS — Z3A11 11 weeks gestation of pregnancy: Secondary | ICD-10-CM | POA: Diagnosis not present

## 2016-04-28 DIAGNOSIS — O0991 Supervision of high risk pregnancy, unspecified, first trimester: Secondary | ICD-10-CM

## 2016-04-28 DIAGNOSIS — O26891 Other specified pregnancy related conditions, first trimester: Secondary | ICD-10-CM | POA: Insufficient documentation

## 2016-04-28 DIAGNOSIS — O9989 Other specified diseases and conditions complicating pregnancy, childbirth and the puerperium: Secondary | ICD-10-CM

## 2016-04-28 LAB — CBC
HEMATOCRIT: 38.4 % (ref 35.0–45.0)
Hemoglobin: 13 g/dL (ref 11.7–15.5)
MCH: 27.3 pg (ref 27.0–33.0)
MCHC: 33.9 g/dL (ref 32.0–36.0)
MCV: 80.7 fL (ref 80.0–100.0)
MPV: 9.6 fL (ref 7.5–12.5)
PLATELETS: 200 10*3/uL (ref 140–400)
RBC: 4.76 MIL/uL (ref 3.80–5.10)
RDW: 13.7 % (ref 11.0–15.0)
WBC: 3.7 10*3/uL — ABNORMAL LOW (ref 3.8–10.8)

## 2016-04-28 LAB — COMPREHENSIVE METABOLIC PANEL
ALT: 21 U/L (ref 6–29)
AST: 19 U/L (ref 10–30)
Albumin: 3.9 g/dL (ref 3.6–5.1)
Alkaline Phosphatase: 48 U/L (ref 33–115)
BILIRUBIN TOTAL: 0.3 mg/dL (ref 0.2–1.2)
BUN: 6 mg/dL — AB (ref 7–25)
CHLORIDE: 102 mmol/L (ref 98–110)
CO2: 24 mmol/L (ref 20–31)
CREATININE: 0.57 mg/dL (ref 0.50–1.10)
Calcium: 8.9 mg/dL (ref 8.6–10.2)
GLUCOSE: 125 mg/dL — AB (ref 65–99)
Potassium: 4.2 mmol/L (ref 3.5–5.3)
SODIUM: 139 mmol/L (ref 135–146)
Total Protein: 6.8 g/dL (ref 6.1–8.1)

## 2016-04-28 NOTE — Addendum Note (Signed)
Addended by: Sherre LainASH, AMANDA A on: 04/28/2016 02:12 PM   Modules accepted: Orders

## 2016-05-01 LAB — CREATININE CLEARANCE, URINE, 24 HOUR
CREAT CLEAR: 172 mL/min — AB (ref 75–115)
CREATININE, URINE: 166 mg/dL (ref 20–320)
Creatinine, 24H Ur: 1.41 g/(24.h) (ref 0.63–2.50)
Creatinine: 0.57 mg/dL (ref 0.50–1.10)

## 2016-05-01 LAB — PROTEIN, URINE, 24 HOUR
PROTEIN, URINE: 17 mg/dL (ref 5–24)
Protein, 24H Urine: 145 mg/24 h (ref <150)

## 2016-05-02 ENCOUNTER — Ambulatory Visit: Payer: Self-pay

## 2016-05-04 ENCOUNTER — Encounter: Payer: BLUE CROSS/BLUE SHIELD | Admitting: Women's Health

## 2016-05-04 ENCOUNTER — Encounter: Payer: Self-pay | Admitting: *Deleted

## 2016-05-04 ENCOUNTER — Other Ambulatory Visit (HOSPITAL_COMMUNITY): Payer: Self-pay

## 2016-05-06 ENCOUNTER — Telehealth: Payer: Self-pay | Admitting: *Deleted

## 2016-05-06 NOTE — Telephone Encounter (Signed)
Pt left message yesterday requesting results of First Screen. I called pt today and left message stating that her test result was normal. She will need another blood test in about 6 weeks to complete the testing for abnormalities of her baby. She may call back if she has questions.

## 2016-05-12 ENCOUNTER — Inpatient Hospital Stay (HOSPITAL_COMMUNITY)
Admission: AD | Admit: 2016-05-12 | Discharge: 2016-05-12 | Disposition: A | Discharge status: Home / Self Care | Payer: BLUE CROSS/BLUE SHIELD | Source: Ambulatory Visit | Attending: Obstetrics & Gynecology | Admitting: Obstetrics & Gynecology

## 2016-05-12 ENCOUNTER — Encounter (HOSPITAL_COMMUNITY): Payer: Self-pay | Admitting: *Deleted

## 2016-05-12 DIAGNOSIS — O26892 Other specified pregnancy related conditions, second trimester: Secondary | ICD-10-CM | POA: Diagnosis not present

## 2016-05-12 DIAGNOSIS — I272 Other secondary pulmonary hypertension: Secondary | ICD-10-CM | POA: Insufficient documentation

## 2016-05-12 DIAGNOSIS — I10 Essential (primary) hypertension: Secondary | ICD-10-CM

## 2016-05-12 DIAGNOSIS — Z3A14 14 weeks gestation of pregnancy: Secondary | ICD-10-CM | POA: Diagnosis not present

## 2016-05-12 DIAGNOSIS — O99612 Diseases of the digestive system complicating pregnancy, second trimester: Secondary | ICD-10-CM | POA: Insufficient documentation

## 2016-05-12 DIAGNOSIS — K219 Gastro-esophageal reflux disease without esophagitis: Secondary | ICD-10-CM | POA: Diagnosis not present

## 2016-05-12 DIAGNOSIS — O99412 Diseases of the circulatory system complicating pregnancy, second trimester: Secondary | ICD-10-CM | POA: Diagnosis not present

## 2016-05-12 DIAGNOSIS — G43909 Migraine, unspecified, not intractable, without status migrainosus: Secondary | ICD-10-CM | POA: Diagnosis not present

## 2016-05-12 DIAGNOSIS — R51 Headache: Secondary | ICD-10-CM | POA: Diagnosis present

## 2016-05-12 DIAGNOSIS — M329 Systemic lupus erythematosus, unspecified: Secondary | ICD-10-CM | POA: Insufficient documentation

## 2016-05-12 DIAGNOSIS — Z87891 Personal history of nicotine dependence: Secondary | ICD-10-CM | POA: Diagnosis not present

## 2016-05-12 DIAGNOSIS — M3500 Sicca syndrome, unspecified: Secondary | ICD-10-CM | POA: Diagnosis not present

## 2016-05-12 DIAGNOSIS — G43001 Migraine without aura, not intractable, with status migrainosus: Secondary | ICD-10-CM | POA: Diagnosis not present

## 2016-05-12 DIAGNOSIS — R109 Unspecified abdominal pain: Secondary | ICD-10-CM | POA: Diagnosis present

## 2016-05-12 HISTORY — DX: Sjogren syndrome, unspecified: M35.00

## 2016-05-12 LAB — URINALYSIS, ROUTINE W REFLEX MICROSCOPIC
Bilirubin Urine: NEGATIVE
Glucose, UA: NEGATIVE mg/dL
HGB URINE DIPSTICK: NEGATIVE
Ketones, ur: 15 mg/dL — AB
LEUKOCYTES UA: NEGATIVE
NITRITE: NEGATIVE
Protein, ur: NEGATIVE mg/dL
SPECIFIC GRAVITY, URINE: 1.01 (ref 1.005–1.030)
pH: 7 (ref 5.0–8.0)

## 2016-05-12 MED ORDER — METOCLOPRAMIDE HCL 5 MG/ML IJ SOLN
10.0000 mg | Freq: Once | INTRAMUSCULAR | Status: AC
  Administered 2016-05-12: 10 mg via INTRAVENOUS
  Filled 2016-05-12: qty 2

## 2016-05-12 MED ORDER — OXYCODONE-ACETAMINOPHEN 5-325 MG PO TABS
1.0000 | ORAL_TABLET | Freq: Once | ORAL | Status: AC
  Administered 2016-05-12: 1 via ORAL
  Filled 2016-05-12: qty 1

## 2016-05-12 MED ORDER — SODIUM CHLORIDE 0.9 % IV SOLN
INTRAVENOUS | Status: DC
  Administered 2016-05-12: 19:00:00 via INTRAVENOUS

## 2016-05-12 MED ORDER — DIPHENHYDRAMINE HCL 50 MG/ML IJ SOLN
25.0000 mg | Freq: Once | INTRAMUSCULAR | Status: AC
  Administered 2016-05-12: 25 mg via INTRAVENOUS
  Filled 2016-05-12: qty 1

## 2016-05-12 MED ORDER — CYCLOBENZAPRINE HCL 10 MG PO TABS: 10.0000 mg | ORAL_TABLET | Freq: Two times a day (BID) | ORAL | Status: DC | PRN

## 2016-05-12 NOTE — MAU Note (Signed)
Has a migraine headache, past 3-4 days; pain in upper abd x2 days- staying more steady now.

## 2016-05-12 NOTE — MAU Provider Note (Signed)
History     CSN: [id number]  Arrival date and time: 05/12/16 1718   First Provider Initiated Contact with Patient 05/12/16 1814      Chief Complaint  Patient presents with  . Headache  . Abdominal Pain   HPI  Pt is 1135w4d G2P0010 with multiple medical illnesses including SLE, pulmonary hypertension and history of migraines.  Pt had increase in headaches with pregnancy.  Pt has had a migraine for 2 days.  Pt has associated nausea and photophobia.  Pt took Fioricet without relief this morning; ice pack did not help.  Pt is on daily prednisone 15mg  3 times daily. Pt also c/o of upper abdominal pain RUQ then LUQ- pt had diarrhea today.- no vaginal spotting, bleeding or LOF. No cramping  Past Medical History  Diagnosis Date  . CAP (community acquired pneumonia) 01/07/2015  . GERD (gastroesophageal reflux disease)   . Daily headache     "sometimes" (01/08/2015)  . Arthritis     "hands and legs" (01/08/2015)  . Lupus (HCC)   . Lung disease   . Pulmonary hypertension (HCC)   . Hypertension   . Sjogren's syndrome [facility](HCC)     Past Surgical History  Procedure Laterality Date  . Finger surgery Right 03/2014    "laceration, nerve/artery injury" 2nd digit  . Video bronchoscopy Bilateral 01/12/2015    Procedure: VIDEO BRONCHOSCOPY WITH FLUORO;  Surgeon: Leslye Peerobert S Byrum, MD;  [facility];  Service: Cardiopulmonary;  Laterality: Bilateral;  . Dilation and evacuation N/A 07/13/2015    Procedure: DILATATION AND EVACUATION;  Surgeon: Levie HeritageJacob J Stinson, DO;  Location: [facility] ORS;  Service: Gynecology;  Laterality: N/A;  . Cardiac catheterization N/A 09/09/2015    Procedure: Right Heart Cath;  Surgeon: Laurey Moralealton S McLean, MD;  [facility];  Service: Cardiovascular;  Laterality: N/A;    Family History  Problem Relation Age of Onset  . Diabetes Mother   . Diabetes Maternal Aunt   . Diabetes Maternal Grandmother     Social History  Substance Use Topics  . Smoking status:  Former Smoker -- 0.10 packs/day for 5 years    Types: Cigarettes    Quit date: 11/20/2014  . Smokeless tobacco: Never Used  . Alcohol Use: No     Comment: 01/08/2015 "last drink was New Year's Eve"    Allergies:  Allergies  Allergen Reactions  . Hydrocodone Nausea And Vomiting  . Zithromax [Azithromycin] Cough    Prescriptions prior to admission  Medication Sig Dispense Refill Last Dose  . amLODipine (NORVASC) 5 MG tablet Take 5 mg by mouth daily.   Taking  . azaTHIOprine (IMURAN) 50 MG tablet Take 50 mg by mouth 3 (three) times daily. Reported on 04/28/2016   Not Taking  . diphenhydrAMINE (BENADRYL) 25 mg capsule Take 25 mg by mouth every 8 (eight) hours as needed for itching or allergies. Reported on 04/28/2016   Not Taking  . fluconazole (DIFLUCAN) 150 MG tablet Take 1 tablet (150 mg total) by mouth once. (Patient not taking: Reported on 04/28/2016) 1 tablet 0 Not Taking  . folic acid (FOLVITE) 1 MG tablet Take 1 tablet by mouth daily.   Taking  . predniSONE (DELTASONE) 5 MG tablet Take 15 mg by mouth daily with breakfast. Take 3 tablets daily.   Taking  . Prenatal Multivit-Min-Fe-FA (PRENATAL VITAMINS PO) Take 1 tablet by mouth daily.   Taking    Review of Systems  Constitutional: Negative for fever and chills.  Gastrointestinal: Positive for nausea,  abdominal pain and diarrhea. Negative for vomiting.  Genitourinary: Negative for dysuria and urgency.  Musculoskeletal: Positive for back pain.  Neurological: Positive for headaches. Negative for dizziness.   Physical Exam   Blood pressure 125/75, pulse 102, temperature 98.9 F (37.2 C), temperature source Oral, resp. rate 18, weight 166 lb 3.2 oz (75.388 kg), last menstrual period 02/05/2016.  Physical Exam  Nursing note and vitals reviewed. Constitutional: She is oriented to person, place, and time. She appears well-developed and well-nourished. No distress.  HENT:  Head: Normocephalic.  Eyes: Pupils are equal, round, and  reactive to light.  Neck: Normal range of motion. Neck supple.  Cardiovascular: Normal rate.   Respiratory: Effort normal.  GI: Soft. There is no tenderness.  Musculoskeletal: Normal range of motion.  Neurological: She is alert and oriented to person, place, and time.  Skin: Skin is warm and dry.  Psychiatric: She has a normal mood and affect.    MAU Course  Procedures IVF NS with benadryl 25mg  and Reglan 10mg  Pt feeling better but still 7/10- Percocet 1 tablet given Chart says pt has N&V with Percocet, but pt states that she can take Percocet Discussed with dr. Alvester MorinNewton migraine treatment options for this patient with multiple medical issues FHT 155 bpm with doppler Assessment and Plan  Migraine - Rx Flexeril Viable 5716w4d pregnancy SLE, chronic hypertension on Norvasc, pulmonary hypertension F/u with scheduled appointments  Kayla [name] 05/12/2016, 6:15 PM

## 2016-05-16 ENCOUNTER — Ambulatory Visit (INDEPENDENT_AMBULATORY_CARE_PROVIDER_SITE_OTHER): Payer: BLUE CROSS/BLUE SHIELD | Admitting: Family Medicine

## 2016-05-16 VITALS — BP 122/76 | HR 114 | Wt 168.9 lb

## 2016-05-16 DIAGNOSIS — M329 Systemic lupus erythematosus, unspecified: Secondary | ICD-10-CM

## 2016-05-16 DIAGNOSIS — O24912 Unspecified diabetes mellitus in pregnancy, second trimester: Secondary | ICD-10-CM | POA: Diagnosis not present

## 2016-05-16 DIAGNOSIS — O24919 Unspecified diabetes mellitus in pregnancy, unspecified trimester: Secondary | ICD-10-CM | POA: Insufficient documentation

## 2016-05-16 DIAGNOSIS — O10919 Unspecified pre-existing hypertension complicating pregnancy, unspecified trimester: Secondary | ICD-10-CM

## 2016-05-16 DIAGNOSIS — O10912 Unspecified pre-existing hypertension complicating pregnancy, second trimester: Secondary | ICD-10-CM

## 2016-05-16 DIAGNOSIS — O99519 Diseases of the respiratory system complicating pregnancy, unspecified trimester: Secondary | ICD-10-CM

## 2016-05-16 DIAGNOSIS — G43119 Migraine with aura, intractable, without status migrainosus: Secondary | ICD-10-CM | POA: Diagnosis not present

## 2016-05-16 DIAGNOSIS — O99512 Diseases of the respiratory system complicating pregnancy, second trimester: Secondary | ICD-10-CM

## 2016-05-16 DIAGNOSIS — O9989 Other specified diseases and conditions complicating pregnancy, childbirth and the puerperium: Secondary | ICD-10-CM

## 2016-05-16 DIAGNOSIS — I272 Other secondary pulmonary hypertension: Secondary | ICD-10-CM

## 2016-05-16 DIAGNOSIS — O0992 Supervision of high risk pregnancy, unspecified, second trimester: Secondary | ICD-10-CM

## 2016-05-16 HISTORY — DX: Diseases of the respiratory system complicating pregnancy, unspecified trimester: O99.519

## 2016-05-16 LAB — POCT URINALYSIS DIP (DEVICE)
GLUCOSE, UA: NEGATIVE mg/dL
KETONES UR: NEGATIVE mg/dL
Leukocytes, UA: NEGATIVE
Nitrite: NEGATIVE
PH: 6 (ref 5.0–8.0)
PROTEIN: 30 mg/dL — AB
SPECIFIC GRAVITY, URINE: 1.025 (ref 1.005–1.030)
Urobilinogen, UA: 0.2 mg/dL (ref 0.0–1.0)

## 2016-05-16 MED ORDER — ACCU-CHEK NANO SMARTVIEW W/DEVICE KIT: 1.0000 | PACK | Status: DC

## 2016-05-16 MED ORDER — GLUCOSE BLOOD VI STRP: ORAL_STRIP | Status: DC

## 2016-05-16 MED ORDER — ASPIRIN 81 MG PO CHEW: 81.0000 mg | CHEWABLE_TABLET | Freq: Every day | ORAL | Status: DC

## 2016-05-16 MED ORDER — ACCU-CHEK FASTCLIX LANCETS MISC: 1.0000 [IU] | Freq: Four times a day (QID) | Status: DC

## 2016-05-16 NOTE — Patient Instructions (Signed)
Second Trimester of Pregnancy The second trimester is from week 13 through week 28, months 4 through 6. The second trimester is often a time when you feel your best. Your body has also adjusted to being pregnant, and you begin to feel better physically. Usually, morning sickness has lessened or quit completely, you may have more energy, and you may have an increase in appetite. The second trimester is also a time when the fetus is growing rapidly. At the end of the sixth month, the fetus is about 9 inches long and weighs about 1 pounds. You will likely begin to feel the baby move (quickening) between 18 and 20 weeks of the pregnancy. BODY CHANGES Your body goes through many changes during pregnancy. The changes vary from woman to woman.   Your weight will continue to increase. You will notice your lower abdomen bulging out.  You may begin to get stretch marks on your hips, abdomen, and breasts.  You may develop headaches that can be relieved by medicines approved by your health care provider.  You may urinate more often because the fetus is pressing on your bladder.  You may develop or continue to have heartburn as a result of your pregnancy.  You may develop constipation because certain hormones are causing the muscles that push waste through your intestines to slow down.  You may develop hemorrhoids or swollen, bulging veins (varicose veins).  You may have back pain because of the weight gain and pregnancy hormones relaxing your joints between the bones in your pelvis and as a result of a shift in weight and the muscles that support your balance.  Your breasts will continue to grow and be tender.  Your gums may bleed and may be sensitive to brushing and flossing.  Dark spots or blotches (chloasma, mask of pregnancy) may develop on your face. This will likely fade after the baby is born.  A dark line from your belly button to the pubic area (linea nigra) may appear. This will likely  fade after the baby is born.  You may have changes in your hair. These can include thickening of your hair, rapid growth, and changes in texture. Some women also have hair loss during or after pregnancy, or hair that feels dry or thin. Your hair will most likely return to normal after your baby is born. WHAT TO EXPECT AT YOUR PRENATAL VISITS During a routine prenatal visit:  You will be weighed to make sure you and the fetus are growing normally.  Your blood pressure will be taken.  Your abdomen will be measured to track your baby's growth.  The fetal heartbeat will be listened to.  Any test results from the previous visit will be discussed. Your health care provider may ask you:  How you are feeling.  If you are feeling the baby move.  If you have had any abnormal symptoms, such as leaking fluid, bleeding, severe headaches, or abdominal cramping.  If you are using any tobacco products, including cigarettes, chewing tobacco, and electronic cigarettes.  If you have any questions. Other tests that may be performed during your second trimester include:  Blood tests that check for:  Low iron levels (anemia).  Gestational diabetes (between 24 and 28 weeks).  Rh antibodies.  Urine tests to check for infections, diabetes, or protein in the urine.  An ultrasound to confirm the proper growth and development of the baby.  An amniocentesis to check for possible genetic problems.  Fetal screens for spina bifida   and Down syndrome.  HIV (human immunodeficiency virus) testing. Routine prenatal testing includes screening for HIV, unless you choose not to have this test. HOME CARE INSTRUCTIONS   Avoid all smoking, herbs, alcohol, and unprescribed drugs. These chemicals affect the formation and growth of the baby.  Do not use any tobacco products, including cigarettes, chewing tobacco, and electronic cigarettes. If you need help quitting, ask your health care provider. You may receive  counseling support and other resources to help you quit.  Follow your health care provider's instructions regarding medicine use. There are medicines that are either safe or unsafe to take during pregnancy.  Exercise only as directed by your health care provider. Experiencing uterine cramps is a good sign to stop exercising.  Continue to eat regular, healthy meals.  Wear a good support bra for breast tenderness.  Do not use hot tubs, steam rooms, or saunas.  Wear your seat belt at all times when driving.  Avoid raw meat, uncooked cheese, cat litter boxes, and soil used by cats. These carry germs that can cause birth defects in the baby.  Take your prenatal vitamins.  Take 1500-2000 mg of calcium daily starting at the 20th week of pregnancy until you deliver your baby.  Try taking a stool softener (if your health care provider approves) if you develop constipation. Eat more high-fiber foods, such as fresh vegetables or fruit and whole grains. Drink plenty of fluids to keep your urine clear or pale yellow.  Take warm sitz baths to soothe any pain or discomfort caused by hemorrhoids. Use hemorrhoid cream if your health care provider approves.  If you develop varicose veins, wear support hose. Elevate your feet for 15 minutes, 3-4 times a day. Limit salt in your diet.  Avoid heavy lifting, wear low heel shoes, and practice good posture.  Rest with your legs elevated if you have leg cramps or low back pain.  Visit your dentist if you have not gone yet during your pregnancy. Use a soft toothbrush to brush your teeth and be gentle when you floss.  A sexual relationship may be continued unless your health care provider directs you otherwise.  Continue to go to all your prenatal visits as directed by your health care provider. SEEK MEDICAL CARE IF:   You have dizziness.  You have mild pelvic cramps, pelvic pressure, or nagging pain in the abdominal area.  You have persistent nausea,  vomiting, or diarrhea.  You have a bad smelling vaginal discharge.  You have pain with urination. SEEK IMMEDIATE MEDICAL CARE IF:   You have a fever.  You are leaking fluid from your vagina.  You have spotting or bleeding from your vagina.  You have severe abdominal cramping or pain.  You have rapid weight gain or loss.  You have shortness of breath with chest pain.  You notice sudden or extreme swelling of your face, hands, ankles, feet, or legs.  You have not felt your baby move in over an hour.  You have severe headaches that do not go away with medicine.  You have vision changes.   This information is not intended to replace advice given to you by your health care provider. Make sure you discuss any questions you have with your health care provider.   Document Released: 11/01/2001 Document Revised: 11/28/2014 Document Reviewed: 01/08/2013 Elsevier Interactive Patient Education 2016 [employer]   Breastfeeding Deciding to breastfeed is one of the best choices you can make for you and your baby. A change   in hormones during pregnancy causes your breast tissue to grow and increases the number and size of your milk ducts. These hormones also allow proteins, sugars, and fats from your blood supply to make breast milk in your milk-producing glands. Hormones prevent breast milk from being released before your baby is born as well as prompt milk flow after birth. Once breastfeeding has begun, thoughts of your baby, as well as his or her sucking or crying, can stimulate the release of milk from your milk-producing glands.  BENEFITS OF BREASTFEEDING For Your Baby  Your first milk (colostrum) helps your baby's digestive system function better.  There are antibodies in your milk that help your baby fight off infections.  Your baby has a lower incidence of asthma, allergies, and sudden infant death syndrome.  The nutrients in breast milk are better for your baby than infant  formulas and are designed uniquely for your baby's needs.  Breast milk improves your baby's brain development.  Your baby is less likely to develop other conditions, such as childhood obesity, asthma, or type 2 diabetes mellitus. For You  Breastfeeding helps to create a very special bond between you and your baby.  Breastfeeding is convenient. Breast milk is always available at the correct temperature and costs nothing.  Breastfeeding helps to burn calories and helps you lose the weight gained during pregnancy.  Breastfeeding makes your uterus contract to its prepregnancy size faster and slows bleeding (lochia) after you give birth.   Breastfeeding helps to lower your risk of developing type 2 diabetes mellitus, osteoporosis, and breast or ovarian cancer later in life. SIGNS THAT YOUR BABY IS HUNGRY Early Signs of Hunger  Increased alertness or activity.  Stretching.  Movement of the head from side to side.  Movement of the head and opening of the mouth when the corner of the mouth or cheek is stroked (rooting).  Increased sucking sounds, smacking lips, cooing, sighing, or squeaking.  Hand-to-mouth movements.  Increased sucking of fingers or hands. Late Signs of Hunger  Fussing.  Intermittent crying. Extreme Signs of Hunger Signs of extreme hunger will require calming and consoling before your baby will be able to breastfeed successfully. Do not wait for the following signs of extreme hunger to occur before you initiate breastfeeding:  Restlessness.  A loud, strong cry.  Screaming. BREASTFEEDING BASICS Breastfeeding Initiation  Find a comfortable place to sit or lie down, with your neck and back well supported.  Place a pillow or rolled up blanket under your baby to bring him or her to the level of your breast (if you are seated). Nursing pillows are specially designed to help support your arms and your baby while you breastfeed.  Make sure that your baby's  abdomen is facing your abdomen.  Gently massage your breast. With your fingertips, massage from your chest wall toward your nipple in a circular motion. This encourages milk flow. You may need to continue this action during the feeding if your milk flows slowly.  Support your breast with 4 fingers underneath and your thumb above your nipple. Make sure your fingers are well away from your nipple and your baby's mouth.  Stroke your baby's lips gently with your finger or nipple.  When your baby's mouth is open wide enough, quickly bring your baby to your breast, placing your entire nipple and as much of the colored area around your nipple (areola) as possible into your baby's mouth.  More areola should be visible above your baby's upper lip than   below the lower lip.  Your baby's tongue should be between his or her lower gum and your breast.  Ensure that your baby's mouth is correctly positioned around your nipple (latched). Your baby's lips should create a seal on your breast and be turned out (everted).  It is common for your baby to suck about 2-3 minutes in order to start the flow of breast milk. Latching Teaching your baby how to latch on to your breast properly is very important. An improper latch can cause nipple pain and decreased milk supply for you and poor weight gain in your baby. Also, if your baby is not latched onto your nipple properly, he or she may swallow some air during feeding. This can make your baby fussy. Burping your baby when you switch breasts during the feeding can help to get rid of the air. However, teaching your baby to latch on properly is still the best way to prevent fussiness from swallowing air while breastfeeding. Signs that your baby has successfully latched on to your nipple:  Silent tugging or silent sucking, without causing you pain.  Swallowing heard between every 3-4 sucks.  Muscle movement above and in front of his or her ears while sucking. Signs  that your baby has not successfully latched on to nipple:  Sucking sounds or smacking sounds from your baby while breastfeeding.  Nipple pain. If you think your baby has not latched on correctly, slip your finger into the corner of your baby's mouth to break the suction and place it between your baby's gums. Attempt breastfeeding initiation again. Signs of Successful Breastfeeding Signs from your baby:  A gradual decrease in the number of sucks or complete cessation of sucking.  Falling asleep.  Relaxation of his or her body.  Retention of a small amount of milk in his or her mouth.  Letting go of your breast by himself or herself. Signs from you:  Breasts that have increased in firmness, weight, and size 1-3 hours after feeding.  Breasts that are softer immediately after breastfeeding.  Increased milk volume, as well as a change in milk consistency and color by the fifth day of breastfeeding.  Nipples that are not sore, cracked, or bleeding. Signs That Your Baby is Getting Enough Milk  Wetting at least 3 diapers in a 24-hour period. The urine should be clear and pale yellow by age 5 days.  At least 3 stools in a 24-hour period by age 5 days. The stool should be soft and yellow.  At least 3 stools in a 24-hour period by age 7 days. The stool should be seedy and yellow.  No loss of weight greater than 10% of birth weight during the first 3 days of age.  Average weight gain of 4-7 ounces (113-198 g) per week after age 4 days.  Consistent daily weight gain by age 5 days, without weight loss after the age of 2 weeks. After a feeding, your baby may spit up a small amount. This is common. BREASTFEEDING FREQUENCY AND DURATION Frequent feeding will help you make more milk and can prevent sore nipples and breast engorgement. Breastfeed when you feel the need to reduce the fullness of your breasts or when your baby shows signs of hunger. This is called "breastfeeding on demand." Avoid  introducing a pacifier to your baby while you are working to establish breastfeeding (the first 4-6 weeks after your baby is born). After this time you may choose to use a pacifier. Research has shown that   pacifier use during the first year of a baby's life decreases the risk of sudden infant death syndrome (SIDS). Allow your baby to feed on each breast as long as he or she wants. Breastfeed until your baby is finished feeding. When your baby unlatches or falls asleep while feeding from the first breast, offer the second breast. Because newborns are often sleepy in the first few weeks of life, you may need to awaken your baby to get him or her to feed. Breastfeeding times will vary from baby to baby. However, the following rules can serve as a guide to help you ensure that your baby is properly fed:  Newborns (babies 4 weeks of age or younger) may breastfeed every 1-3 hours.  Newborns should not go longer than 3 hours during the day or 5 hours during the night without breastfeeding.  You should breastfeed your baby a minimum of 8 times in a 24-hour period until you begin to introduce solid foods to your baby at around 6 months of age. BREAST MILK PUMPING Pumping and storing breast milk allows you to ensure that your baby is exclusively fed your breast milk, even at times when you are unable to breastfeed. This is especially important if you are going back to work while you are still breastfeeding or when you are not able to be present during feedings. Your lactation consultant can give you guidelines on how long it is safe to store breast milk. A breast pump is a machine that allows you to pump milk from your breast into a sterile bottle. The pumped breast milk can then be stored in a refrigerator or freezer. Some breast pumps are operated by hand, while others use electricity. Ask your lactation consultant which type will work best for you. Breast pumps can be purchased, but some hospitals and  breastfeeding support groups lease breast pumps on a monthly basis. A lactation consultant can teach you how to hand express breast milk, if you prefer not to use a pump. CARING FOR YOUR BREASTS WHILE YOU BREASTFEED Nipples can become dry, cracked, and sore while breastfeeding. The following recommendations can help keep your breasts moisturized and healthy:  Avoid using soap on your nipples.  Wear a supportive bra. Although not required, special nursing bras and tank tops are designed to allow access to your breasts for breastfeeding without taking off your entire bra or top. Avoid wearing underwire-style bras or extremely tight bras.  Air dry your nipples for 3-4minutes after each feeding.  Use only cotton bra pads to absorb leaked breast milk. Leaking of breast milk between feedings is normal.  Use lanolin on your nipples after breastfeeding. Lanolin helps to maintain your skin's normal moisture barrier. If you use pure lanolin, you do not need to wash it off before feeding your baby again. Pure lanolin is not toxic to your baby. You may also hand express a few drops of breast milk and gently massage that milk into your nipples and allow the milk to air dry. In the first few weeks after giving birth, some women experience extremely full breasts (engorgement). Engorgement can make your breasts feel heavy, warm, and tender to the touch. Engorgement peaks within 3-5 days after you give birth. The following recommendations can help ease engorgement:  Completely empty your breasts while breastfeeding or pumping. You may want to start by applying warm, moist heat (in the shower or with warm water-soaked hand towels) just before feeding or pumping. This increases circulation and helps the milk   flow. If your baby does not completely empty your breasts while breastfeeding, pump any extra milk after he or she is finished.  Wear a snug bra (nursing or regular) or tank top for 1-2 days to signal your body  to slightly decrease milk production.  Apply ice packs to your breasts, unless this is too uncomfortable for you.  Make sure that your baby is latched on and positioned properly while breastfeeding. If engorgement persists after 48 hours of following these recommendations, contact your health care provider or a lactation consultant. OVERALL HEALTH CARE RECOMMENDATIONS WHILE BREASTFEEDING  Eat healthy foods. Alternate between meals and snacks, eating 3 of each per day. Because what you eat affects your breast milk, some of the foods may make your baby more irritable than usual. Avoid eating these foods if you are sure that they are negatively affecting your baby.  Drink milk, fruit juice, and water to satisfy your thirst (about 10 glasses a day).  Rest often, relax, and continue to take your prenatal vitamins to prevent fatigue, stress, and anemia.  Continue breast self-awareness checks.  Avoid chewing and smoking tobacco. Chemicals from cigarettes that pass into breast milk and exposure to secondhand smoke may harm your baby.  Avoid alcohol and drug use, including marijuana. Some medicines that may be harmful to your baby can pass through breast milk. It is important to ask your health care provider before taking any medicine, including all over-the-counter and prescription medicine as well as vitamin and herbal supplements. It is possible to become pregnant while breastfeeding. If birth control is desired, ask your health care provider about options that will be safe for your baby. SEEK MEDICAL CARE IF:  You feel like you want to stop breastfeeding or have become frustrated with breastfeeding.  You have painful breasts or nipples.  Your nipples are cracked or bleeding.  Your breasts are red, tender, or warm.  You have a swollen area on either breast.  You have a fever or chills.  You have nausea or vomiting.  You have drainage other than breast milk from your nipples.  Your  breasts do not become full before feedings by the fifth day after you give birth.  You feel sad and depressed.  Your baby is too sleepy to eat well.  Your baby is having trouble sleeping.   Your baby is wetting less than 3 diapers in a 24-hour period.  Your baby has less than 3 stools in a 24-hour period.  Your baby's skin or the white part of his or her eyes becomes yellow.   Your baby is not gaining weight by 5 days of age. SEEK IMMEDIATE MEDICAL CARE IF:  Your baby is overly tired (lethargic) and does not want to wake up and feed.  Your baby develops an unexplained fever.   This information is not intended to replace advice given to you by your health care provider. Make sure you discuss any questions you have with your health care provider.   Document Released: 11/07/2005 Document Revised: 07/29/2015 Document Reviewed: 05/01/2013 Elsevier Interactive Patient Education 2016 [employer]  

## 2016-05-16 NOTE — Progress Notes (Signed)
Subjective:  Kayla [name] is a 25 y.o. G2P0010 at [redacted]w[redacted]d being seen today for ongoing prenatal care.  She is currently monitored for the following issues for this high-risk pregnancy and has Protein-calorie malnutrition, severe (HCC); Malar rash; Alopecia; Arthralgia; ILD (interstitial lung disease) (HCC); Dyspnea; Pneumomediastinum (HCC); Chronic respiratory failure with hypoxia (HCC); Connective tissue disease, undifferentiated (HCC); Supervision of high risk pregnancy, antepartum; Systemic lupus erythematosus (SLE) affecting pregnancy, antepartum (HCC); Lupus (systemic lupus erythematosus) (HCC); Other chest pain; Loud P2 (pulmonary S2, second heart sound); Encounter for long-term (current) use of high-risk medication; Insomnia; Pulmonary hypertension (HCC); Chronic hypertension; Chronic hypertension during pregnancy, antepartum; GBS bacteriuria; Sjogren's syndrome (HCC); and Chronic Respiratory failure with oxygen requirement affecting pregnancy, antepartum on her problem list.  Patient reports headache.  Contractions: Not present. Vag. Bleeding: None.  Movement: Present. Denies leaking of fluid.   The following portions of the patient's history were reviewed and updated as appropriate: allergies, current medications, past family history, past medical history, past social history, past surgical history and problem list. Problem list updated.  Objective:   Filed Vitals:   05/16/16 0932  BP: 122/76  Pulse: 114  Weight: 168 lb 14.4 oz (76.613 kg)    Fetal Status: Fetal Heart Rate (bpm): 156   Movement: Present     General:  Alert, oriented and cooperative. Patient is in no acute distress.  Skin: Skin is warm and dry. No rash noted.   Cardiovascular: Normal heart rate noted  Respiratory: Normal respiratory effort, no problems with respiration noted  Abdomen: Soft, gravid, appropriate for gestational age. Pain/Pressure: Absent     Pelvic: Cervical exam deferred        Extremities: Normal  range of motion.  Edema: None  Mental Status: Normal mood and affect. Normal behavior. Normal judgment and thought content.   Urinalysis: Urine Protein: 1+ Urine Glucose: Negative  Assessment and Plan:  Pregnancy: G2P0010 at [redacted]w[redacted]d  1. Supervision of high risk pregnancy, antepartum, second trimester Continue prenatal care.  2. Systemic lupus erythematosus (SLE) affecting pregnancy, antepartum (HCC) To see MFM for formal recommendations + SSA and + SSB--at risk for heart block Continue Imuran and Prednisone - AMB referral to maternal fetal medicine  3. Chronic hypertension during pregnancy, antepartum Continue Norvasc and ASA  4. Pulmonary hypertension (HCC) Continue O2 - AMB referral to maternal fetal medicine General dangers of pregnancy discussed  5. Intractable migraine with aura without status migrainosus Not improved with Flexeril--to see headache specialist - AMB referral to headache clinic  6. Diabetes mellitus in pregnancy, antepartum, second trimester Hgb A1C was 6.0--likely related to prednisone use To see Nutrition and Diabetes today for diet and meter. - Blood Glucose Monitoring Suppl (ACCU-CHEK NANO SMARTVIEW) w/Device KIT; 1 Device by Does not apply route as directed. Check fasting, and two hours after breakfast, lunch and dinner  Dispense: 1 kit; Refill: 0 - glucose blood (ACCU-CHEK SMARTVIEW) test strip; Check blood sugars 4x/daily  Dispense: 100 each; Refill: 12 - ACCU-CHEK FASTCLIX LANCETS MISC; 1 Units by Percutaneous route 4 (four) times daily.  Dispense: 100 each; Refill: 12  7. Respiratory system disease affecting pregnancy, antepartum, second trimester   General obstetric precautions including but not limited to vaginal bleeding, contractions, leaking of fluid and fetal movement were reviewed in detail with the patient. Please refer to After Visit Summary for other counseling recommendations.    Tanya S Pratt, MD   

## 2016-05-16 NOTE — Progress Notes (Signed)
Urine: trace hgb 

## 2016-05-17 ENCOUNTER — Telehealth: Payer: Self-pay

## 2016-05-17 NOTE — Telephone Encounter (Signed)
Pt called and stated that her insurance will not cover her strips.   Contacted pt and pt informed me that she went to pick up meter, strips, and lancets but her insurance does not cover the strips.  Pt informed me that she has BCBS and Medicaid.  I informed pt that I would contact her pharmacy to find out what the issue is because between the two insurances her test strips should be covered.  Contacted pt's pharmacy and was informed that they did not have the pt's Medicaid on file and that [employer] has a co-pay for the pt of $45 that she needs to pay to get test strips.  I called the pt and informed her what the pharmacy stated and I confirmed with pt if she had Medicaid.  Pt stated that she does have Medicaid but she does not have a card.  I explained to the patient that she either needs to contact her case worker to get Medicaid card and/or number to contact the pharmacy with so they can cover the rest of test strips or she will need to pay the co-pay.  Pt stated understanding with no further questions.

## 2016-05-19 ENCOUNTER — Encounter: Payer: Self-pay | Admitting: Family

## 2016-05-19 DIAGNOSIS — N390 Urinary tract infection, site not specified: Secondary | ICD-10-CM | POA: Insufficient documentation

## 2016-05-23 ENCOUNTER — Ambulatory Visit: Payer: BLUE CROSS/BLUE SHIELD | Admitting: *Deleted

## 2016-05-23 ENCOUNTER — Encounter: Payer: BLUE CROSS/BLUE SHIELD | Attending: Family Medicine | Admitting: Dietician

## 2016-05-23 DIAGNOSIS — Z029 Encounter for administrative examinations, unspecified: Secondary | ICD-10-CM | POA: Insufficient documentation

## 2016-05-23 DIAGNOSIS — O24913 Unspecified diabetes mellitus in pregnancy, third trimester: Secondary | ICD-10-CM

## 2016-05-23 DIAGNOSIS — O24912 Unspecified diabetes mellitus in pregnancy, second trimester: Secondary | ICD-10-CM

## 2016-05-23 NOTE — Progress Notes (Signed)
Diabetes Education/Nutrition Education:05/23/16 Chipper HerbLahonna is currently at 16 weeks. EDD is 11/11/16. On [place] Medicaid.  Has obtained her meter.  Has been checking glucose levels at home.  Comes to appointment without her meter or records of her glucose readings.  Does report that both grandmothers had DM and she assisted one with taking her blood glucose. Completed review of GDM.   Completed review of self-care measures post-delivery to assist in preventing Type 2 DM later in life. Completed review of the blood glucose monitoring process and the blood glucose goals for fasting and 2 hours post meal. On checking her glucose , her  reading pre-lunch was 79 mg/dl. Instructed to monitor fasting, 2 hr post-meal glucose levels, record and bring her meter and glucose log to all clinic appointments. She is currently using oxygen at 5 liters for increased activity.  Used while walking to my clinic office.  Advised her to use her oxygen and to walk at a pace appropriate for her in the house for 10 minutes 3 times a day.  Encouraged her to discuss walking with her MD at her next visit. Completed review of the Carb restricted diet recommended for GDM.  Completed a review of carb counting, label reading and suggested menu/meal suggestions. Encouraged her to obtain a free carb counting app on he phone to assist her. Will follow as needed. Maggie Qianna Clagett, RN, RD, LDN

## 2016-06-02 ENCOUNTER — Encounter (HOSPITAL_COMMUNITY): Payer: Self-pay

## 2016-06-02 DIAGNOSIS — R768 Other specified abnormal immunological findings in serum: Secondary | ICD-10-CM

## 2016-06-02 HISTORY — DX: Other specified abnormal immunological findings in serum: R76.8

## 2016-06-06 ENCOUNTER — Encounter (HOSPITAL_COMMUNITY): Payer: Self-pay | Admitting: *Deleted

## 2016-06-06 ENCOUNTER — Inpatient Hospital Stay (HOSPITAL_COMMUNITY)
Admission: AD | Admit: 2016-06-06 | Discharge: 2016-06-06 | Disposition: A | Discharge status: Home / Self Care | Payer: BLUE CROSS/BLUE SHIELD | Source: Ambulatory Visit | Attending: Obstetrics and Gynecology | Admitting: Obstetrics and Gynecology

## 2016-06-06 DIAGNOSIS — M329 Systemic lupus erythematosus, unspecified: Secondary | ICD-10-CM | POA: Insufficient documentation

## 2016-06-06 DIAGNOSIS — K219 Gastro-esophageal reflux disease without esophagitis: Secondary | ICD-10-CM | POA: Insufficient documentation

## 2016-06-06 DIAGNOSIS — B9689 Other specified bacterial agents as the cause of diseases classified elsewhere: Secondary | ICD-10-CM | POA: Insufficient documentation

## 2016-06-06 DIAGNOSIS — Z87891 Personal history of nicotine dependence: Secondary | ICD-10-CM | POA: Insufficient documentation

## 2016-06-06 DIAGNOSIS — I272 Other secondary pulmonary hypertension: Secondary | ICD-10-CM | POA: Insufficient documentation

## 2016-06-06 DIAGNOSIS — M3500 Sicca syndrome, unspecified: Secondary | ICD-10-CM | POA: Diagnosis not present

## 2016-06-06 DIAGNOSIS — O26892 Other specified pregnancy related conditions, second trimester: Secondary | ICD-10-CM

## 2016-06-06 DIAGNOSIS — N760 Acute vaginitis: Secondary | ICD-10-CM

## 2016-06-06 DIAGNOSIS — O99612 Diseases of the digestive system complicating pregnancy, second trimester: Secondary | ICD-10-CM | POA: Diagnosis not present

## 2016-06-06 DIAGNOSIS — N898 Other specified noninflammatory disorders of vagina: Secondary | ICD-10-CM | POA: Diagnosis present

## 2016-06-06 DIAGNOSIS — O162 Unspecified maternal hypertension, second trimester: Secondary | ICD-10-CM | POA: Diagnosis not present

## 2016-06-06 DIAGNOSIS — A499 Bacterial infection, unspecified: Secondary | ICD-10-CM | POA: Diagnosis not present

## 2016-06-06 DIAGNOSIS — O23592 Infection of other part of genital tract in pregnancy, second trimester: Secondary | ICD-10-CM | POA: Diagnosis not present

## 2016-06-06 DIAGNOSIS — Z3A18 18 weeks gestation of pregnancy: Secondary | ICD-10-CM

## 2016-06-06 LAB — URINALYSIS, ROUTINE W REFLEX MICROSCOPIC
Bilirubin Urine: NEGATIVE
GLUCOSE, UA: NEGATIVE mg/dL
KETONES UR: 15 mg/dL — AB
Nitrite: NEGATIVE
PH: 6 (ref 5.0–8.0)
Protein, ur: NEGATIVE mg/dL
Specific Gravity, Urine: >1.03 — ABNORMAL HIGH (ref 1.005–1.030)

## 2016-06-06 LAB — WET PREP, GENITAL
Sperm: NONE SEEN
TRICH WET PREP: NONE SEEN
Yeast Wet Prep HPF POC: NONE SEEN

## 2016-06-06 LAB — URINE MICROSCOPIC-ADD ON

## 2016-06-06 MED ORDER — METRONIDAZOLE 500 MG PO TABS: 500.0000 mg | ORAL_TABLET | Freq: Two times a day (BID) | ORAL | Status: DC

## 2016-06-06 NOTE — MAU Note (Signed)
Patient presents at 418 weeks gestation with c/o vaginal discharge. Reports feeling flutters. Denies pain.

## 2016-06-06 NOTE — MAU Provider Note (Signed)
History     CSN: [id number]  Arrival date and time: 06/06/16 1221   First Provider Initiated Contact with Patient 06/06/16 1310      Chief Complaint  Patient presents with  . Vaginal Discharge   Patient is a 25 y.o. female presenting with vaginal discharge. The history is provided by the patient.  Vaginal Discharge Associated symptoms include nausea and vomiting. Pertinent negatives include no abdominal pain, chest pain, chills, fever or headaches.     Kayla [name] is a 25 y.o., G2P0010, @ 53w1dwho present today for vaginal discharge and bleeding. She notices both when she wipes after urination. The discharge started yesterday and is white with an odor. The bleeding started this morning and was described as spotting, she noticed it only one time when she wiped. She reports vaginal itching, and lower back and L sided pelvic pain that is a 5/10. She reports fetal movement. She denies dysuria or hematuria, sharp abdominal pain or cramping, or loss of fluid.   OB History    Gravida Para Term Preterm AB TAB SAB Ectopic Multiple Living   '2 0 0 0 1 0 1 0 0 0 '      Past Medical History  Diagnosis Date  . CAP (community acquired pneumonia) 01/07/2015  . GERD (gastroesophageal reflux disease)   . Daily headache     "sometimes" (01/08/2015)  . Arthritis     "hands and legs" (01/08/2015)  . Lupus ([facility]   . Lung disease   . Pulmonary hypertension ([facility]   . Hypertension   . Sjogren's syndrome ([facility]     Past Surgical History  Procedure Laterality Date  . Finger surgery Right 03/2014    "laceration, nerve/artery injury" 2nd digit  . Video bronchoscopy Bilateral 01/12/2015    Procedure: VIDEO BRONCHOSCOPY WITH FLUORO;  Surgeon: RCollene Gobble MD;  [facility]  Service: Cardiopulmonary;  Laterality: Bilateral;  . Dilation and evacuation N/A 07/13/2015    Procedure: DILATATION AND EVACUATION;  Surgeon: JTruett Mainland DO;  [facility];  Service: Gynecology;  Laterality:  N/A;  . Cardiac catheterization N/A 09/09/2015    Procedure: Right Heart Cath;  Surgeon: DLarey Dresser MD;  [facility];  Service: Cardiovascular;  Laterality: N/A;    Family History  Problem Relation Age of Onset  . Diabetes Mother   . Diabetes Maternal Aunt   . Diabetes Maternal Grandmother     Social History  Substance Use Topics  . Smoking status: Former Smoker -- 0.10 packs/day for 5 years    Types: Cigarettes    Quit date: 11/20/2014  . Smokeless tobacco: Never Used  . Alcohol Use: No     Comment: 01/08/2015 "last drink was New Year's Eve"    Allergies:  Allergies  Allergen Reactions  . Hydrocodone Nausea And Vomiting  . Zithromax [Azithromycin] Cough    Prescriptions prior to admission  Medication Sig Dispense Refill Last Dose  . ACCU-CHEK FASTCLIX LANCETS MISC 1 Units by Percutaneous route 4 (four) times daily. 100 each 12   . amLODipine (NORVASC) 5 MG tablet Take 5 mg by mouth daily.   Taking  . aspirin 81 MG chewable tablet Chew 1 tablet (81 mg total) by mouth daily. 30 tablet 5   . azaTHIOprine (IMURAN) 50 MG tablet Take 50 mg by mouth 3 (three) times daily. Reported on 04/28/2016   Taking  . Blood Glucose Monitoring Suppl (ACCU-CHEK NANO SMARTVIEW) w/Device KIT 1 Device by Does not apply route  as directed. Check fasting, and two hours after breakfast, lunch and dinner 1 kit 0   . cyclobenzaprine (FLEXERIL) 10 MG tablet Take 1 tablet (10 mg total) by mouth 2 (two) times daily as needed for muscle spasms. 20 tablet 0 Taking  . diphenhydrAMINE (BENADRYL) 25 mg capsule Take 25 mg by mouth every 8 (eight) hours as needed for itching or allergies. Reported on 04/28/2016   Taking  . folic acid (FOLVITE) 1 MG tablet Take 1 tablet by mouth daily.   Taking  . glucose blood (ACCU-CHEK SMARTVIEW) test strip Check blood sugars 4x/daily 100 each 12   . predniSONE (DELTASONE) 5 MG tablet Take 15 mg by mouth daily with breakfast. Take 3 tablets daily.   Taking  .  Prenatal Multivit-Min-Fe-FA (PRENATAL VITAMINS PO) Take 1 tablet by mouth daily.   Taking  . tacrolimus (PROTOPIC) 0.1 % ointment APP AA ON THE SKIN D PRN UTD  6     Results for orders placed or performed during the hospital encounter of 06/06/16 (from the past 48 hour(s))  Urinalysis, Routine w reflex microscopic (not at [facility])     Status: Abnormal   Collection Time: 06/06/16 12:51 PM  Result Value Ref Range   Color, Urine YELLOW YELLOW   APPearance HAZY (A) CLEAR   Specific Gravity, Urine >1.030 (H) 1.005 - 1.030   pH 6.0 5.0 - 8.0   Glucose, UA NEGATIVE NEGATIVE mg/dL   Hgb urine dipstick SMALL (A) NEGATIVE   Bilirubin Urine NEGATIVE NEGATIVE   Ketones, ur 15 (A) NEGATIVE mg/dL   Protein, ur NEGATIVE NEGATIVE mg/dL   Nitrite NEGATIVE NEGATIVE   Leukocytes, UA TRACE (A) NEGATIVE  Urine microscopic-add on     Status: Abnormal   Collection Time: 06/06/16 12:51 PM  Result Value Ref Range   Squamous Epithelial / LPF 0-5 (A) NONE SEEN   WBC, UA 0-5 0 - 5 WBC/hpf   RBC / HPF 0-5 0 - 5 RBC/hpf   Bacteria, UA RARE (A) NONE SEEN  Wet prep, genital     Status: Abnormal   Collection Time: 06/06/16  1:50 PM  Result Value Ref Range   Yeast Wet Prep HPF POC NONE SEEN NONE SEEN   Trich, Wet Prep NONE SEEN NONE SEEN   Clue Cells Wet Prep HPF POC PRESENT (A) NONE SEEN   WBC, Wet Prep HPF POC FEW (A) NONE SEEN    Comment: MANY BACTERIA SEEN   Sperm NONE SEEN     Review of Systems  Constitutional: Negative for fever and chills.  Respiratory: Negative for shortness of breath.   Cardiovascular: Negative for chest pain.  Gastrointestinal: Positive for nausea and vomiting. Negative for abdominal pain, diarrhea and constipation.       2 episodes of vomiting in the past week.  Genitourinary: Positive for vaginal discharge. Negative for dysuria and hematuria.       Positive for vaginal discharge and bleeding. Positive for vaginal itching.  Musculoskeletal: Positive for back pain.  Neurological:  Negative for headaches.    Physical Exam   Blood pressure 112/66, pulse 107, temperature 99.1 F (37.3 C), temperature source Oral, resp. rate 16, height '5\' 3"'  (1.6 m), weight 168 lb 14 oz (76.601 kg), last menstrual period 02/05/2016.  Physical Exam  Constitutional: She is oriented to person, place, and time. She appears well-developed and well-nourished.  HENT:  Head: Normocephalic.  Eyes: Pupils are equal, round, and reactive to light.  GI: Soft.  Genitourinary: Vaginal discharge found.  Speculum  exam: Vagina - Small amount of creamy discharge, mild odor Cervix - No contact bleeding, no active bleeding  Bimanual exam: Cervix closed Wet prep and GC collected  Chaperone present for exam.  Musculoskeletal: Normal range of motion.  Neurological: She is alert and oriented to person, place, and time.  Skin: Skin is warm. She is not diaphoretic.  Psychiatric: Her behavior is normal.    MAU Course  Procedures  None  MDM  Wet prep GC + fetal heart tones via doppler  Urine culture pending   Assessment and Plan   A:  1. BV (bacterial vaginosis)   2. Vaginal discharge in pregnancy, second trimester     P:  Discharge home in stable condition.  Rx: Flagyl  No bleeding noted on exam today; bleeding precautions discussed Return to MAU if symptoms worsen Keep your next OB office visit.  Lezlie Lye, NP 06/06/2016 8:28 PM

## 2016-06-06 NOTE — Discharge Instructions (Signed)

## 2016-06-07 LAB — URINE CULTURE
CULTURE: NO GROWTH
Special Requests: NORMAL

## 2016-06-07 LAB — GC/CHLAMYDIA PROBE AMP (~~LOC~~) NOT AT ARMC
CHLAMYDIA, DNA PROBE: NEGATIVE
NEISSERIA GONORRHEA: NEGATIVE

## 2016-06-13 ENCOUNTER — Ambulatory Visit (INDEPENDENT_AMBULATORY_CARE_PROVIDER_SITE_OTHER): Payer: BLUE CROSS/BLUE SHIELD | Admitting: Obstetrics & Gynecology

## 2016-06-13 ENCOUNTER — Encounter (HOSPITAL_COMMUNITY): Payer: Self-pay

## 2016-06-13 VITALS — BP 120/70 | HR 102 | Wt 164.7 lb

## 2016-06-13 DIAGNOSIS — O24912 Unspecified diabetes mellitus in pregnancy, second trimester: Secondary | ICD-10-CM

## 2016-06-13 NOTE — Patient Instructions (Addendum)
Gestational Diabetes Mellitus  Gestational diabetes mellitus, often simply referred to as gestational diabetes, is a type of diabetes that some women develop during pregnancy. In gestational diabetes, the pancreas does not make enough insulin (a hormone), the cells are less responsive to the insulin that is made (insulin resistance), or both. Normally, insulin moves sugars from food into the tissue cells. The tissue cells use the sugars for energy. The lack of insulin or the lack of normal response to insulin causes excess sugars to build up in the blood instead of going into the tissue cells. As a result, high blood sugar (hyperglycemia) develops. The effect of high sugar (glucose) levels can cause many problems.   RISK FACTORS  You have an increased chance of developing gestational diabetes if you have a family history of diabetes and also have one or more of the following risk factors:  · A body mass index over 30 (obesity).  · A previous pregnancy with gestational diabetes.  · An older age at the time of pregnancy.  If blood glucose levels are kept in the normal range during pregnancy, women can have a healthy pregnancy. If your blood glucose levels are not well controlled, there may be risks to you, your unborn baby (fetus), your labor and delivery, or your newborn baby.   SYMPTOMS   If symptoms are experienced, they are much like symptoms you would normally expect during pregnancy. The symptoms of gestational diabetes include:   · Increased thirst (polydipsia).  · Increased urination (polyuria).  · Increased urination during the night (nocturia).  · Weight loss. This weight loss may be rapid.  · Frequent, recurring infections.  · Tiredness (fatigue).  · Weakness.  · Vision changes, such as blurred vision.  · Fruity smell to your breath.  · Abdominal pain.  DIAGNOSIS  Diabetes is diagnosed when blood glucose levels are increased. Your blood glucose level may be checked by one or more of the following blood  tests:  · A fasting blood glucose test. You will not be allowed to eat for at least 8 hours before a blood sample is taken.  · A random blood glucose test. Your blood glucose is checked at any time of the day regardless of when you ate.  · An oral glucose tolerance test (OGTT). Your blood glucose is measured after you have not eaten (fasted) for 1-3 hours and then after you drink a glucose-containing beverage. Since the hormones that cause insulin resistance are highest at about 24-28 weeks of a pregnancy, an OGTT is usually performed during that time. If you have risk factors, you may be screened for undiagnosed type 2 diabetes at your first prenatal visit.  TREATMENT   Gestational diabetes should be managed first with diet and exercise. Medicines may be added only if they are needed.  · You will need to take diabetes medicine or insulin daily to keep blood glucose levels in the desired range.  · You will need to match insulin dosing with exercise and healthy food choices.  If you have gestational diabetes, your treatment goal is to maintain the following blood glucose levels:  · Before meals (preprandial): at or below 95 mg/dL.  · After meals (postprandial):    One hour after a meal: at or below 140 mg/dL.    Two hours after a meal: at or below 120 mg/dL.  If you have pre-existing type 1 or type 2 diabetes, your treatment goal is to maintain the following blood glucose levels:  · Before   meals, at bedtime, and overnight: 60-99 mg/dL.  · After meals: peak of 100-129 mg/dL.  HOME CARE INSTRUCTIONS   · Have your hemoglobin A1c level checked twice a year.  · Perform daily blood glucose monitoring as directed by your health care provider. It is common to perform frequent blood glucose monitoring.  · Monitor urine ketones when you are ill and as directed by your health care provider.  · Take your diabetes medicine and insulin as directed by your health care provider to maintain your blood glucose level in the desired  range.  ¨ Never run out of diabetes medicine or insulin. It is needed every day.  ¨ Adjust insulin based on your intake of carbohydrates. Carbohydrates can raise blood glucose levels but need to be included in your diet. Carbohydrates provide vitamins, minerals, and fiber which are an essential part of a healthy diet. Carbohydrates are found in fruits, vegetables, whole grains, dairy products, legumes, and foods containing added sugars.  · Eat healthy foods. Alternate 3 meals with 3 snacks.  · Maintain a healthy weight gain. The usual total expected weight gain varies according to your prepregnancy body mass index (BMI).  · Carry a medical alert card or wear your medical alert jewelry.  · Carry a 15-gram carbohydrate snack with you at all times to treat low blood glucose (hypoglycemia). Some examples of 15-gram carbohydrate snacks include:  ¨ Glucose tablets, 3 or 4.  ¨ Glucose gel, 15-gram tube.  ¨ Raisins, 2 tablespoons (24 g).  ¨ Jelly beans, 6.  ¨ Animal crackers, 8.  ¨ Fruit juice, regular soda, or low-fat milk, 4 ounces (120 mL).  ¨ Gummy treats, 9.  · Recognize hypoglycemia. Hypoglycemia during pregnancy occurs with blood glucose levels of 60 mg/dL and below. The risk for hypoglycemia increases when fasting or skipping meals, during or after intense exercise, and during sleep. Hypoglycemia symptoms can include:  ¨ Tremors or shakes.  ¨ Decreased ability to concentrate.  ¨ Sweating.  ¨ Increased heart rate.  ¨ Headache.  ¨ Dry mouth.  ¨ Hunger.  ¨ Irritability.  ¨ Anxiety.  ¨ Restless sleep.  ¨ Altered speech or coordination.  ¨ Confusion.  · Treat hypoglycemia promptly. If you are alert and able to safely swallow, follow the 15:15 rule:  ¨ Take 15-20 grams of rapid-acting glucose or carbohydrate. Rapid-acting options include glucose gel, glucose tablets, or 4 ounces (120 mL) of fruit juice, regular soda, or low-fat milk.  ¨ Check your blood glucose level 15 minutes after taking the glucose.  ¨ Take 15-20  grams more of glucose if the repeat blood glucose level is still 70 mg/dL or below.  ¨ Eat a meal or snack within 1 hour once blood glucose levels return to normal.  · Be alert to polyuria (excess urination) and polydipsia (excess thirst) which are early signs of hyperglycemia. An early awareness of hyperglycemia allows for prompt treatment. Treat hyperglycemia as directed by your health care provider.  · Engage in at least 30 minutes of physical activity a day or as directed by your health care provider. Ten minutes of physical activity timed 30 minutes after each meal is encouraged to control postprandial blood glucose levels.  · Adjust your insulin dosing and food intake as needed if you start a new exercise or sport.  · Follow your sick-day plan at any time you are unable to eat or drink as usual.  · Avoid tobacco and alcohol use.  · Keep all follow-up visits as directed   by your health care provider.  · Follow the advice of your health care provider regarding your prenatal and post-delivery (postpartum) appointments, meal planning, exercise, medicines, vitamins, blood tests, other medical tests, and physical activities.  · Perform daily skin and foot care. Examine your skin and feet daily for cuts, bruises, redness, nail problems, bleeding, blisters, or sores.  · Brush your teeth and gums at least twice a day and floss at least once a day. Follow up with your dentist regularly.  · Schedule an eye exam during the first trimester of your pregnancy or as directed by your health care provider.  · Share your diabetes management plan with your workplace or school.  · Stay up-to-date with immunizations.  · Learn to manage stress.  · Obtain ongoing diabetes education and support as needed.  · Learn about and consider breastfeeding your baby.  · You should have your blood sugar level checked 6-12 weeks after delivery. This is done with an oral glucose tolerance test (OGTT).  SEEK MEDICAL CARE IF:   · You are unable to  eat food or drink fluids for more than 6 hours.  · You have nausea and vomiting for more than 6 hours.  · You have a blood glucose level of 200 mg/dL and you have ketones in your urine.  · There is a change in mental status.  · You develop vision problems.  · You have a persistent headache.  · You have upper abdominal pain or discomfort.  · You develop an additional serious illness.  · You have diarrhea for more than 6 hours.  · You have been sick or have had a fever for a couple of days and are not getting better.  SEEK IMMEDIATE MEDICAL CARE IF:   · You have difficulty breathing.  · You no longer feel the baby moving.  · You are bleeding or have discharge from your vagina.  · You start having premature contractions or labor.  MAKE SURE YOU:  · Understand these instructions.  · Will watch your condition.  · Will get help right away if you are not doing well or get worse.     This information is not intended to replace advice given to you by your health care provider. Make sure you discuss any questions you have with your health care provider.     Document Released: 02/13/2001 Document Revised: 11/28/2014 Document Reviewed: 06/05/2012  Elsevier Interactive Patient Education ©2016 [employer]

## 2016-06-13 NOTE — Progress Notes (Signed)
Subjective: LUQ pain and vaginal irritation on Flagyl for BV  Rheda Wyma is a 25 y.o. G2P0010 at [redacted]w[redacted]d being seen today for ongoing prenatal care.  She is currently monitored for the following issues for this high-risk pregnancy and has Protein-calorie malnutrition, severe (HCC); Malar rash; Alopecia; Arthralgia; ILD (interstitial lung disease) (HCC); Dyspnea; Pneumomediastinum (HCC); Chronic respiratory failure with hypoxia (HCC); Connective tissue disease, undifferentiated (HCC); Supervision of high risk pregnancy, antepartum; Systemic lupus erythematosus (SLE) affecting pregnancy, antepartum (HCC); Lupus (systemic lupus erythematosus) (HCC); Other chest pain; Loud P2 (pulmonary S2, second heart sound); Encounter for long-term (current) use of high-risk medication; Insomnia; Pulmonary hypertension (HCC); Chronic hypertension; Chronic hypertension during pregnancy, antepartum; GBS bacteriuria; Sjogren's syndrome (HCC); Chronic Respiratory failure with oxygen requirement affecting pregnancy, antepartum; Intractable migraine with aura without status migrainosus; and Diabetes mellitus in pregnancy, antepartum on her problem list.  Patient reports vaginal irritation.   .  .  Movement: Present. Denies leaking of fluid.   The following portions of the patient's history were reviewed and updated as appropriate: allergies, current medications, past family history, past medical history, past social history, past surgical history and problem list. Problem list updated.  Objective:   Vitals:   06/13/16 1021  BP: 120/70  Pulse: (!) 102  Weight: 164 lb 11.2 oz (74.7 kg)    Fetal Status: Fetal Heart Rate (bpm): 155   Movement: Present     General:  Alert, oriented and cooperative. Patient is in no acute distress.  Skin: Skin is warm and dry. No rash noted.   Cardiovascular: Normal heart rate noted  Respiratory: Normal respiratory effort, no problems with respiration noted  Abdomen: Soft, gravid,  appropriate for gestational age. Pain/Pressure: Absent     Pelvic:  Cervical exam deferred        Extremities: Normal range of motion.     Mental Status: Normal mood and affect. Normal behavior. Normal judgment and thought content.   Urinalysis:      Assessment and Plan:  Pregnancy: G2P0010 at [redacted]w[redacted]d  1. Diabetes mellitus in pregnancy, antepartum, second trimester Positive nitrites - Culture, OB Urine On macrobid for supression will increase to BID for 7 days pending culture Preterm labor symptoms and general obstetric precautions including but not limited to vaginal bleeding, contractions, leaking of fluid and fetal movement were reviewed in detail with the patient. Please refer to After Visit Summary for other counseling recommendations.  2 week f/u and bring her BG result which she says are usually in range  Adam Phenix, MD

## 2016-06-14 ENCOUNTER — Ambulatory Visit (HOSPITAL_COMMUNITY)
Admission: RE | Admit: 2016-06-14 | Discharge: 2016-06-14 | Disposition: A | Discharge status: Home / Self Care | Payer: BLUE CROSS/BLUE SHIELD | Source: Ambulatory Visit | Attending: Family Medicine | Admitting: Family Medicine

## 2016-06-14 ENCOUNTER — Other Ambulatory Visit (HOSPITAL_COMMUNITY): Payer: Self-pay | Admitting: Maternal and Fetal Medicine

## 2016-06-14 ENCOUNTER — Encounter (HOSPITAL_COMMUNITY): Payer: Self-pay

## 2016-06-14 DIAGNOSIS — O99519 Diseases of the respiratory system complicating pregnancy, unspecified trimester: Secondary | ICD-10-CM

## 2016-06-14 DIAGNOSIS — O10012 Pre-existing essential hypertension complicating pregnancy, second trimester: Secondary | ICD-10-CM | POA: Insufficient documentation

## 2016-06-14 DIAGNOSIS — M329 Systemic lupus erythematosus, unspecified: Secondary | ICD-10-CM

## 2016-06-14 DIAGNOSIS — O10919 Unspecified pre-existing hypertension complicating pregnancy, unspecified trimester: Secondary | ICD-10-CM

## 2016-06-14 DIAGNOSIS — Z3A18 18 weeks gestation of pregnancy: Secondary | ICD-10-CM

## 2016-06-14 DIAGNOSIS — O9989 Other specified diseases and conditions complicating pregnancy, childbirth and the puerperium: Principal | ICD-10-CM

## 2016-06-14 DIAGNOSIS — I10 Essential (primary) hypertension: Secondary | ICD-10-CM

## 2016-06-14 DIAGNOSIS — Z3A19 19 weeks gestation of pregnancy: Secondary | ICD-10-CM

## 2016-06-14 DIAGNOSIS — O99112 Other diseases of the blood and blood-forming organs and certain disorders involving the immune mechanism complicating pregnancy, second trimester: Secondary | ICD-10-CM | POA: Diagnosis present

## 2016-06-14 DIAGNOSIS — Z36 Encounter for antenatal screening of mother: Secondary | ICD-10-CM | POA: Diagnosis not present

## 2016-06-14 DIAGNOSIS — O99512 Diseases of the respiratory system complicating pregnancy, second trimester: Secondary | ICD-10-CM

## 2016-06-14 DIAGNOSIS — O24410 Gestational diabetes mellitus in pregnancy, diet controlled: Secondary | ICD-10-CM

## 2016-06-14 LAB — POCT URINALYSIS DIP (DEVICE)
Glucose, UA: NEGATIVE mg/dL
Ketones, ur: 15 mg/dL — AB
Nitrite: POSITIVE — AB
Protein, ur: 100 mg/dL — AB
Specific Gravity, Urine: >=1.03 (ref 1.005–1.030)
Urobilinogen, UA: 1 mg/dL (ref 0.0–1.0)
pH: 6 (ref 5.0–8.0)

## 2016-06-14 NOTE — Progress Notes (Signed)
MFM Consult  25 year old, G2P0010 at 19 weeks 2 days gestation seen in consultation today  Past Medical History:  Diagnosis Date  . Arthritis    "hands and legs" (01/08/2015)  . CAP (community acquired pneumonia) 01/07/2015  . Daily headache    "sometimes" (01/08/2015)  . GERD (gastroesophageal reflux disease)   . Hypertension   . Lung disease   . Lupus (HCC)   . Pulmonary hypertension (HCC)   . Sjogren's syndrome [facility])    Past Surgical History:  Procedure Laterality Date  . CARDIAC CATHETERIZATION N/A 09/09/2015   Procedure: Right Heart Cath;  Surgeon: Laurey Morale, MD;  Location: [facility] INVASIVE CV LAB;  Service: Cardiovascular;  Laterality: N/A;  . DILATION AND EVACUATION N/A 07/13/2015   Procedure: DILATATION AND EVACUATION;  Surgeon: Levie Heritage, DO;  Location: [facility] ORS;  Service: Gynecology;  Laterality: N/A;  . FINGER SURGERY Right 03/2014   "laceration, nerve/artery injury" 2nd digit  . VIDEO BRONCHOSCOPY Bilateral 01/12/2015   Procedure: VIDEO BRONCHOSCOPY WITH FLUORO;  Surgeon: Leslye Peer, MD;  Location: [facility] ENDOSCOPY;  Service: Cardiopulmonary;  Laterality: Bilateral;   OB History    Gravida Para Term Preterm AB Living   2 0 0 0 1 0   SAB TAB Ectopic Multiple Live Births   1 0 0 0       Social History   Social History  . Marital status: Single    Spouse name: N/A  . Number of children: N/A  . Years of education: N/A   Occupational History  . [employer]      Workers Compensation   Social History Main Topics  . Smoking status: Former Smoker    Packs/day: 0.10    Years: 5.00    Types: Cigarettes    Quit date: 11/20/2014  . Smokeless tobacco: Never Used  . Alcohol use No     Comment: 01/08/2015 "last drink was New Year's Eve"  . Drug use: No  . Sexual activity: Yes    Birth control/ protection: None     Comment: intercours on 03/07/16   Other Topics Concern  . Not on file   Social History Narrative  . No narrative on file   Family History  Problem  Relation Age of Onset  . Diabetes Mother   . Diabetes Maternal Aunt   . Diabetes Maternal Grandmother    Current Outpatient Prescriptions on File Prior to Encounter  Medication Sig Dispense Refill  . ACCU-CHEK FASTCLIX LANCETS MISC 1 Units by Percutaneous route 4 (four) times daily. 100 each 12  . amLODipine (NORVASC) 5 MG tablet Take 5 mg by mouth daily.    Marland Kitchen aspirin 81 MG chewable tablet Chew 1 tablet (81 mg total) by mouth daily. 30 tablet 5  . azaTHIOprine (IMURAN) 50 MG tablet Take 50 mg by mouth 3 (three) times daily. Reported on 04/28/2016    . Blood Glucose Monitoring Suppl (ACCU-CHEK NANO SMARTVIEW) w/Device KIT 1 Device by Does not apply route as directed. Check fasting, and two hours after breakfast, lunch and dinner 1 kit 0  . folic acid (FOLVITE) 1 MG tablet Take 1 tablet by mouth daily.    Marland Kitchen glucose blood (ACCU-CHEK SMARTVIEW) test strip Check blood sugars 4x/daily 100 each 12  . metroNIDAZOLE (FLAGYL) 500 MG tablet Take 1 tablet (500 mg total) by mouth 2 (two) times daily. (Patient not taking: Reported on 06/13/2016) 14 tablet 0  . predniSONE (DELTASONE) 5 MG tablet Take 15 mg by mouth daily  with breakfast. Take 3 tablets daily.    . Prenatal Multivit-Min-Fe-FA (PRENATAL VITAMINS PO) Take 1 tablet by mouth daily.     No current facility-administered medications on file prior to encounter.    Allergies  Allergen Reactions  . Hydrocodone Nausea And Vomiting  . Zithromax [Azithromycin] Cough    Impression / Recommendations 1. Supervision of high risk pregnancy, antepartum, second trimester 2. Systemic lupus erythematosus (SLE) affecting pregnancy, antepartum ([place]) - Discussed the implications of her lupus on pregnancy, pregnancy on lupus and the medications on the developing fetus. We reviewed the fetal anatomic survey from today which showed normal growth and amniotic fluid volume. No apparent birth defects were noted and the limitations of ultrasound to detect all birth  defects and fetal aneuploidy were discussed with her.  - + SSA and + SSB--at risk for heart block.  - Recommendations per [facility] pediatric cardiology from 7/13  include:   Infants born to mother's with SSA or SSB antibodies are at increased risk of developing congenital heart block due to the antibodies crossing the placenta and damaging the conduction system of the fetal heart. The heart block can be of varying degrees and can occur abruptly. For mothers who are considered to be low risk the incidence of fetal congenital complete heart block (CCHB) is approximately 2-6%. Having a previous child with heart block or other autoimmune disorders such as antibody mediated maternal thyroid dysfunction are considered to be markers for increased risk of CCHB. For those considered to be high risk the incidence of CCHB is approximately 20-50%. The development of CCHB is associated with high risk of morbidity and mortality for the fetus. There is some evidence that first and second degree heart block may be reversible with medical therapy. Therefore it is standard practice at our institution that all mothers with SSA or SSB antibodies undergo routine screening for heart block from 18 through 28-[redacted] weeks gestation, which corresponds to the period of greatest risk. We recommend that mothers, such as Ms. Macomber, who are low risk have repeat fetal echocardiograms every other week to reassess for signs of developing heart block with fetal heart rate checks at the obstetrician's office on the weeks between to assess for bradycardia.   3. Chronic hypertension during pregnancy, antepartum - We discussed the implications of maternal hypertension to the developing baby and maternal risks through pregnancy - Continue Norvasc and ASA - Titrate levels to maintain blood pressure 4. Pulmonary hypertension ([place]) - She had a maternal cardiac echo at [facility] on 15 Apr 2016 which showed an EF of ~55% and no pulmonary hypertension -  Follow up as clinically indicated  5. Intractable migraine with aura without status migrainosus Not improved with Flexeril--to see headache specialist - AMB referral to headache clinic 6. Diabetes mellitus in pregnancy, antepartum, second trimester - Hgb A1C was 6.0 on 14 Apr 2016. This islikely related to prednisone use - She did not bring her glucose log today. She notes that her fastings are in the 80's range. 2 hr Br 130's, 2 hr L are 150's and 2 hr D 160-170's.  - Given her current need for prednisone, would start split dose insulin to manage to target goals for pregnancy - Serial monthly ultrasounds for evaluation of fetal growth starting ~[redacted] weeks gestation.  - Daily maternal assessment of fetal movement with immediate provider contact for decreased or cessation of fetal movement starting ~[redacted] weeks gestation - Serial antenatal testing starting ~[redacted] weeks gestation or sooner as clinically indicated. Consider  delivery at ~37 weeks given hypertension and pulmonary issues.  7. Respiratory system disease affecting pregnancy, antepartum, second trimester - She currently being seen and managed at [facility] for SLE and her pulmonary issues. She notes that she has never been intubated, but that she has had hospitalization for breathing issues prior to her diagnosis. We discussed the issues with pulmonary disease during pregnancy and particularly with advancing gestation.   She notes her last PFTs were at the 25 of May showed  FEV1 Pre 1.33 L  FEV1/FVC Pre 86.99 %  FEV1/SVC Pre 86.99 %  MVV Pre 47 L/min  FVC Pre 1.53 L  PEF Pre 5.07 L/s  FEF25-75% Pre 2.01 L/s  DLCO Pre 8.69 ml/(min*mmHg)   BHT Pre 12.44 sec   VA Pre 2.09 L  IVC Pre 0.89 L  ERV Pre 0.31 L  FRC N2 Pre 1.05 L  RV Pre 0.25 L  TLC Pre 1.78 L  VC Pre 1.53 L  IC Pre 0.73 L  FVC Pred 3.7   FVC %Pre Pred 41 %  FEV1 Pred 3.23   FEV1 %Pre Pred 41 %  FEV1/FVC Pred 88   FEV1/FVC %Pre Pred 99 %  FEF25-75% Pred 3.95    FEF25-75% %Pre Pred 51 %  PEF Pred 6.45   PEF %Pre Pred 79 %  MVV Pred 118   MVV %Pre Pred 40 %  TLC Pred 4.9   TLC %Pre Pred 36 %  VC Pred 3.7   VC %Pre Pred 41 %  RV Pred 1.25   RV %Pre Pred 20 %  IC Pred 2.48   IC %Pre Pred 29 %  ERV Pred 1.22   ERV %Pre Pred 25 %  DLCO Pred 22.6   DLCO %Pre Pred 38 %   - Serial PFT's per her pulmonary provider - Continue Imuran and Prednisone  Questions appear answered to her satisfaction. Precautions for the above given. Reviewed the ultrasound results from today. Spent greater than 1/2 of 45 minute visit face to face counseling.

## 2016-06-15 LAB — CULTURE, OB URINE

## 2016-06-17 DIAGNOSIS — O0990 Supervision of high risk pregnancy, unspecified, unspecified trimester: Secondary | ICD-10-CM | POA: Insufficient documentation

## 2016-06-20 ENCOUNTER — Encounter: Payer: Self-pay | Admitting: Family Medicine

## 2016-06-20 ENCOUNTER — Encounter: Payer: Self-pay | Admitting: Maternal and Fetal Medicine

## 2016-06-24 ENCOUNTER — Institutional Professional Consult (permissible substitution): Payer: BLUE CROSS/BLUE SHIELD | Admitting: Physician Assistant

## 2016-07-11 ENCOUNTER — Encounter: Payer: Self-pay | Admitting: General Practice

## 2016-07-18 ENCOUNTER — Encounter (HOSPITAL_COMMUNITY): Payer: Self-pay

## 2016-07-20 ENCOUNTER — Encounter: Payer: Self-pay | Admitting: Advanced Practice Midwife

## 2016-07-27 ENCOUNTER — Ambulatory Visit (INDEPENDENT_AMBULATORY_CARE_PROVIDER_SITE_OTHER): Payer: BLUE CROSS/BLUE SHIELD | Admitting: Family Medicine

## 2016-07-27 VITALS — BP 124/69 | HR 94 | Wt 173.4 lb

## 2016-07-27 DIAGNOSIS — O10919 Unspecified pre-existing hypertension complicating pregnancy, unspecified trimester: Secondary | ICD-10-CM

## 2016-07-27 DIAGNOSIS — O99112 Other diseases of the blood and blood-forming organs and certain disorders involving the immune mechanism complicating pregnancy, second trimester: Secondary | ICD-10-CM

## 2016-07-27 DIAGNOSIS — M329 Systemic lupus erythematosus, unspecified: Secondary | ICD-10-CM

## 2016-07-27 DIAGNOSIS — O0992 Supervision of high risk pregnancy, unspecified, second trimester: Secondary | ICD-10-CM

## 2016-07-27 DIAGNOSIS — J849 Interstitial pulmonary disease, unspecified: Secondary | ICD-10-CM

## 2016-07-27 DIAGNOSIS — O99512 Diseases of the respiratory system complicating pregnancy, second trimester: Secondary | ICD-10-CM

## 2016-07-27 DIAGNOSIS — O10912 Unspecified pre-existing hypertension complicating pregnancy, second trimester: Secondary | ICD-10-CM

## 2016-07-27 DIAGNOSIS — O24912 Unspecified diabetes mellitus in pregnancy, second trimester: Secondary | ICD-10-CM

## 2016-07-27 DIAGNOSIS — O9989 Other specified diseases and conditions complicating pregnancy, childbirth and the puerperium: Secondary | ICD-10-CM

## 2016-07-27 LAB — POCT URINALYSIS DIP (DEVICE)
BILIRUBIN URINE: NEGATIVE
GLUCOSE, UA: NEGATIVE mg/dL
HGB URINE DIPSTICK: NEGATIVE
Ketones, ur: NEGATIVE mg/dL
LEUKOCYTES UA: NEGATIVE
NITRITE: NEGATIVE
Protein, ur: NEGATIVE mg/dL
Specific Gravity, Urine: >=1.03 (ref 1.005–1.030)
UROBILINOGEN UA: 0.2 mg/dL (ref 0.0–1.0)
pH: 6.5 (ref 5.0–8.0)

## 2016-07-27 MED ORDER — GLYBURIDE 2.5 MG PO TABS
2.5000 mg | ORAL_TABLET | Freq: Every day | ORAL | 3 refills | Status: DC
Start: 2016-07-27 — End: 2017-02-01

## 2016-07-27 NOTE — Patient Instructions (Addendum)
Following an appropriate diet and keeping your blood sugar under control is the most important thing to do for your health and that of your unborn baby.  Please check your blood sugar 4 times daily.  Please keep accurate BS logs and bring them with you to every visit.  Please bring your meter also.  Goals for Blood sugar should be: 1. Fasting (first thing in the morning before eating) should be less than 90.   2.  2 hours after meals should be less than 120.  Please eat 3 meals and 3 snacks.  Include protein (meat, dairy-cheese, eggs, nuts) with all meals.  Be mindful that carbohydrates increase your blood sugar.  Not just sweet food (cookies, cake, donuts, fruit, juice, soda) but also bread, pasta, rice, and potatoes.  You have to limit how many carbs you are eating.  Adding exercise, as little as 30 minutes a day can decrease your blood sugar.  Second Trimester of Pregnancy The second trimester is from week 13 through week 28, months 4 through 6. The second trimester is often a time when you feel your best. Your body has also adjusted to being pregnant, and you begin to feel better physically. Usually, morning sickness has lessened or quit completely, you may have more energy, and you may have an increase in appetite. The second trimester is also a time when the fetus is growing rapidly. At the end of the sixth month, the fetus is about 9 inches long and weighs about 1 pounds. You will likely begin to feel the baby move (quickening) between 18 and 20 weeks of the pregnancy. BODY CHANGES Your body goes through many changes during pregnancy. The changes vary from woman to woman.   Your weight will continue to increase. You will notice your lower abdomen bulging out.  You may begin to get stretch marks on your hips, abdomen, and breasts.  You may develop headaches that can be relieved by medicines approved by your health care provider.  You may urinate more often because the fetus is  pressing on your bladder.  You may develop or continue to have heartburn as a result of your pregnancy.  You may develop constipation because certain hormones are causing the muscles that push waste through your intestines to slow down.  You may develop hemorrhoids or swollen, bulging veins (varicose veins).  You may have back pain because of the weight gain and pregnancy hormones relaxing your joints between the bones in your pelvis and as a result of a shift in weight and the muscles that support your balance.  Your breasts will continue to grow and be tender.  Your gums may bleed and may be sensitive to brushing and flossing.  Dark spots or blotches (chloasma, mask of pregnancy) may develop on your face. This will likely fade after the baby is born.  A dark line from your belly button to the pubic area (linea nigra) may appear. This will likely fade after the baby is born.  You may have changes in your hair. These can include thickening of your hair, rapid growth, and changes in texture. Some women also have hair loss during or after pregnancy, or hair that feels dry or thin. Your hair will most likely return to normal after your baby is born. WHAT TO EXPECT AT YOUR PRENATAL VISITS During a routine prenatal visit:  You will be weighed to make sure you and the fetus are growing normally.  Your blood pressure will be taken.    Your abdomen will be measured to track your baby's growth.  The fetal heartbeat will be listened to.  Any test results from the previous visit will be discussed. Your health care provider may ask you:  How you are feeling.  If you are feeling the baby move.  If you have had any abnormal symptoms, such as leaking fluid, bleeding, severe headaches, or abdominal cramping.  If you are using any tobacco products, including cigarettes, chewing tobacco, and electronic cigarettes.  If you have any questions. Other tests that may be performed during your second  trimester include:  Blood tests that check for:  Low iron levels (anemia).  Gestational diabetes (between 24 and 28 weeks).  Rh antibodies.  Urine tests to check for infections, diabetes, or protein in the urine.  An ultrasound to confirm the proper growth and development of the baby.  An amniocentesis to check for possible genetic problems.  Fetal screens for spina bifida and Down syndrome.  HIV (human immunodeficiency virus) testing. Routine prenatal testing includes screening for HIV, unless you choose not to have this test. HOME CARE INSTRUCTIONS   Avoid all smoking, herbs, alcohol, and unprescribed drugs. These chemicals affect the formation and growth of the baby.  Do not use any tobacco products, including cigarettes, chewing tobacco, and electronic cigarettes. If you need help quitting, ask your health care provider. You may receive counseling support and other resources to help you quit.  Follow your health care provider's instructions regarding medicine use. There are medicines that are either safe or unsafe to take during pregnancy.  Exercise only as directed by your health care provider. Experiencing uterine cramps is a good sign to stop exercising.  Continue to eat regular, healthy meals.  Wear a good support bra for breast tenderness.  Do not use hot tubs, steam rooms, or saunas.  Wear your seat belt at all times when driving.  Avoid raw meat, uncooked cheese, cat litter boxes, and soil used by cats. These carry germs that can cause birth defects in the baby.  Take your prenatal vitamins.  Take 1500-2000 mg of calcium daily starting at the 20th week of pregnancy until you deliver your baby.  Try taking a stool softener (if your health care provider approves) if you develop constipation. Eat more high-fiber foods, such as fresh vegetables or fruit and whole grains. Drink plenty of fluids to keep your urine clear or pale yellow.  Take warm sitz baths to soothe  any pain or discomfort caused by hemorrhoids. Use hemorrhoid cream if your health care provider approves.  If you develop varicose veins, wear support hose. Elevate your feet for 15 minutes, 3-4 times a day. Limit salt in your diet.  Avoid heavy lifting, wear low heel shoes, and practice good posture.  Rest with your legs elevated if you have leg cramps or low back pain.  Visit your dentist if you have not gone yet during your pregnancy. Use a soft toothbrush to brush your teeth and be gentle when you floss.  A sexual relationship may be continued unless your health care provider directs you otherwise.  Continue to go to all your prenatal visits as directed by your health care provider. SEEK MEDICAL CARE IF:   You have dizziness.  You have mild pelvic cramps, pelvic pressure, or nagging pain in the abdominal area.  You have persistent nausea, vomiting, or diarrhea.  You have a bad smelling vaginal discharge.  You have pain with urination. SEEK IMMEDIATE MEDICAL CARE IF:  You have a fever.  You are leaking fluid from your vagina.  You have spotting or bleeding from your vagina.  You have severe abdominal cramping or pain.  You have rapid weight gain or loss.  You have shortness of breath with chest pain.  You notice sudden or extreme swelling of your face, hands, ankles, feet, or legs.  You have not felt your baby move in over an hour.  You have severe headaches that do not go away with medicine.  You have vision changes.   This information is not intended to replace advice given to you by your health care provider. Make sure you discuss any questions you have with your health care provider.   Document Released: 11/01/2001 Document Revised: 11/28/2014 Document Reviewed: 01/08/2013 Elsevier Interactive Patient Education [employer]2016 Elsevier Inc.

## 2016-07-27 NOTE — Progress Notes (Signed)
    PRENATAL VISIT NOTE  Subjective:  Kayla [name] is a 25 y.o. G2P0010 at 2731w3d being seen today for ongoing prenatal care.  She is currently monitored for the following issues for this high-risk pregnancy and has Protein-calorie malnutrition, severe (HCC); Malar rash; Alopecia; Arthralgia; ILD (interstitial lung disease) (HCC); Dyspnea; Pneumomediastinum (HCC); Chronic respiratory failure with hypoxia (HCC); Connective tissue disease, undifferentiated (HCC); Supervision of high risk pregnancy, antepartum; Systemic lupus erythematosus (SLE) affecting pregnancy, antepartum (HCC); Lupus (systemic lupus erythematosus) (HCC); Other chest pain; Loud P2 (pulmonary S2, second heart sound); Encounter for long-term (current) use of high-risk medication; Insomnia; Pulmonary hypertension (HCC); Chronic hypertension; Chronic hypertension during pregnancy, antepartum; GBS bacteriuria; Sjogren's syndrome (HCC); Chronic Respiratory failure with oxygen requirement affecting pregnancy, antepartum; Intractable migraine with aura without status migrainosus; and Diabetes mellitus in pregnancy, antepartum on her problem list.  Patient reports no complaints.  Contractions: Not present. Vag. Bleeding: None.  Movement: Present. Denies leaking of fluid.   The following portions of the patient's history were reviewed and updated as appropriate: allergies, current medications, past family history, past medical history, past social history, past surgical history and problem list. Problem list updated.  Objective:   Vitals:   07/27/16 1504  BP: 124/69  Pulse: 94  Weight: 173 lb 6.4 oz (78.7 kg)    Fetal Status: Fetal Heart Rate (bpm): 151 Fundal Height: 23 cm Movement: Present     General:  Alert, oriented and cooperative. Patient is in no acute distress.  Skin: Skin is warm and dry. No rash noted.   Cardiovascular: Normal heart rate noted  Respiratory: Normal respiratory effort, no problems with respiration noted    Abdomen: Soft, gravid, appropriate for gestational age. Pain/Pressure: Present     Pelvic:  Cervical exam deferred        Extremities: Normal range of motion.  Edema: Trace  Mental Status: Normal mood and affect. Normal behavior. Normal judgment and thought content.   Urinalysis:     FBS 85-91 2 hr pp 87-239  Assessment and Plan:  Pregnancy: G2P0010 at 7131w3d  1. Chronic hypertension during pregnancy, antepartum Continue Norvasc and ASA Needs u/s for growth BP ok today - US MFM OB FOLLOW UP; Future  2. ILD (interstitial lung disease) (HCC) Less need for O2  3. Respiratory system disease affecting pregnancy, antepartum, second trimester   4. Diabetes mellitus in pregnancy, antepartum, second trimester Given that 40% of BS are too high--work on diet, exercise if tolerated and add Glyburide to am regimen--FBS are normal. - glyBURIDE (DIABETA) 2.5 MG tablet; Take 1 tablet (2.5 mg total) by mouth daily with breakfast.  Dispense: 60 tablet; Refill: 3 - US MFM OB FOLLOW UP; Future  5. Supervision of high risk pregnancy, antepartum, second trimester - US MFM OB FOLLOW UP; Future  6. Systemic lupus erythematosus (SLE) affecting pregnancy, antepartum (HCC) Continue Imuran and prednisone. Biweekly fetal echos--WNL so far--continue until 28-30 wks.  Preterm labor symptoms and general obstetric precautions including but not limited to vaginal bleeding, contractions, leaking of fluid and fetal movement were reviewed in detail with the patient. Please refer to After Visit Summary for other counseling recommendations.  Return in 2 weeks (on 08/10/2016).  Reva Boresanya S Jazmin Ley, MD

## 2016-08-02 ENCOUNTER — Encounter (HOSPITAL_COMMUNITY): Payer: Self-pay

## 2016-08-05 ENCOUNTER — Ambulatory Visit (HOSPITAL_COMMUNITY)
Admission: RE | Admit: 2016-08-05 | Discharge: 2016-08-05 | Disposition: A | Discharge status: Home / Self Care | Payer: BLUE CROSS/BLUE SHIELD | Source: Ambulatory Visit | Attending: Family Medicine | Admitting: Family Medicine

## 2016-08-05 DIAGNOSIS — O0992 Supervision of high risk pregnancy, unspecified, second trimester: Secondary | ICD-10-CM

## 2016-08-05 DIAGNOSIS — O24912 Unspecified diabetes mellitus in pregnancy, second trimester: Secondary | ICD-10-CM

## 2016-08-05 DIAGNOSIS — O26891 Other specified pregnancy related conditions, first trimester: Secondary | ICD-10-CM | POA: Insufficient documentation

## 2016-08-05 DIAGNOSIS — Z3A26 26 weeks gestation of pregnancy: Secondary | ICD-10-CM | POA: Diagnosis not present

## 2016-08-05 DIAGNOSIS — O10012 Pre-existing essential hypertension complicating pregnancy, second trimester: Secondary | ICD-10-CM | POA: Diagnosis not present

## 2016-08-05 DIAGNOSIS — O10919 Unspecified pre-existing hypertension complicating pregnancy, unspecified trimester: Secondary | ICD-10-CM

## 2016-08-05 DIAGNOSIS — M329 Systemic lupus erythematosus, unspecified: Secondary | ICD-10-CM | POA: Insufficient documentation

## 2016-08-05 DIAGNOSIS — O24410 Gestational diabetes mellitus in pregnancy, diet controlled: Secondary | ICD-10-CM | POA: Diagnosis not present

## 2016-08-15 ENCOUNTER — Encounter: Payer: Self-pay | Admitting: Advanced Practice Midwife

## 2016-08-15 ENCOUNTER — Ambulatory Visit (INDEPENDENT_AMBULATORY_CARE_PROVIDER_SITE_OTHER): Payer: BLUE CROSS/BLUE SHIELD | Admitting: Advanced Practice Midwife

## 2016-08-15 VITALS — BP 109/75 | HR 86 | Wt 175.0 lb

## 2016-08-15 DIAGNOSIS — Z23 Encounter for immunization: Secondary | ICD-10-CM

## 2016-08-15 DIAGNOSIS — O24913 Unspecified diabetes mellitus in pregnancy, third trimester: Secondary | ICD-10-CM

## 2016-08-15 DIAGNOSIS — O0993 Supervision of high risk pregnancy, unspecified, third trimester: Secondary | ICD-10-CM

## 2016-08-15 LAB — HEMOGLOBIN A1C
Hgb A1c MFr Bld: 5.3 % (ref <5.7)
Mean Plasma Glucose: 105 mg/dL

## 2016-08-15 MED ORDER — PROMETHAZINE HCL 25 MG PO TABS: 25.0000 mg | ORAL_TABLET | Freq: Four times a day (QID) | ORAL | 2 refills | Status: DC | PRN

## 2016-08-15 NOTE — Progress Notes (Signed)
Needs 28 week labs CBC RPR HIV

## 2016-08-15 NOTE — Progress Notes (Signed)
PRENATAL VISIT NOTE  Subjective:  Kayla [name] is a 25 y.o. G2P0010 at [redacted]w[redacted]d being seen today for ongoing prenatal care.  She is currently monitored for the following issues for this high-risk pregnancy and has Protein-calorie malnutrition, severe (HCC); Malar rash; Alopecia; Arthralgia; Interstitial lung disease (HCC); Dyspnea; Pneumomediastinum (HCC); Chronic respiratory failure with hypoxia (HCC); Connective tissue disease, undifferentiated (HCC); Supervision of high risk pregnancy, antepartum; Systemic lupus erythematosus (SLE) affecting pregnancy, antepartum (HCC); Lupus (systemic lupus erythematosus) (HCC); Other chest pain; Loud P2 (pulmonary S2, second heart sound); Encounter for long-term (current) use of high-risk medication; Insomnia; Other secondary pulmonary hypertension (HCC); Chronic hypertension; Chronic hypertension during pregnancy, antepartum; GBS bacteriuria; Sjogren's syndrome (HCC); Chronic Respiratory failure with oxygen requirement affecting pregnancy, antepartum; Intractable migraine with aura without status migrainosus; Diabetes mellitus in pregnancy, antepartum; Chronic UTI; SS-A antibody positive; SS-B antibody positive; and High risk pregnancy, antepartum on her problem list.  Patient reports nausea and "feel bad" today, not sleeping well, nausea. Had diarrhea last week which she determined was due to Glyburide so she stopped taking it.  Contractions: Not present. Vag. Bleeding: None.  Movement: Present. Denies leaking of fluid.   States blood sugars "are all good".  Did not bring book.  States Fastings are all 87-90.  States postprandials are "mostly good" with a few post supper ones of 163. States that happened twice.  The following portions of the patient's history were reviewed and updated as appropriate: allergies, current medications, past family history, past medical history, past social history, past surgical history and problem list. Problem list  updated.  Objective:   Vitals:   08/15/16 1457  BP: 109/75  Pulse: 86  Weight: 175 lb (79.4 kg)    Fetal Status: Fetal Heart Rate (bpm): 148   Movement: Present     General:  Alert, oriented and cooperative. Patient is in no acute distress.  Skin: Skin is warm and dry. No rash noted.   Cardiovascular: Normal heart rate noted  Respiratory: Normal respiratory effort, no problems with respiration noted  Abdomen: Soft, gravid, appropriate for gestational age. Pain/Pressure: Present     Pelvic:  Cervical exam deferred        Extremities: Normal range of motion.  Edema: Trace  Mental Status: Normal mood and affect. Normal behavior. Normal judgment and thought content.   Urinalysis:      Assessment and Plan:  Pregnancy: G2P0010 at [redacted]w[redacted]d  1. Supervision of high risk pregnancy in third trimester  - CBC - RPR - HIV antibody (with reflex)  2. Diabetes in undelivered pregnancy, third trimester      Encouraged her to try Glyburide again.  Suggested she try 1-2 days of half tablet then work back to one tablet daily as ordered.  Discussed need to recored BS values and bring in.  If she develops vomiting, needs to call and possibly come to [facility] for treatment. Push fluids.  Will Rx Phenergan to take at least at night for nausea, and will help her sleep also - Hemoglobin A1c  3. Supervision of high risk pregnancy, antepartum, third trimester      Will need to start growth [place] at 32 weeks per MFM      Flu shot today      Does not want to do TDAP today with flu shot.  Preterm labor symptoms and general obstetric precautions including but not limited to vaginal bleeding, contractions, leaking of fluid and fetal movement were reviewed in detail with the patient. Please refer to  After Visit Summary for other counseling recommendations.  Return in about 2 weeks (around 08/29/2016) for [facility] ![facility]iva Signs.  Monita Swier L Niurka Benecke, CNM

## 2016-08-15 NOTE — Patient Instructions (Signed)
Glyburide tablets What is this medicine? GLYBURIDE (GLYE byoor ide) helps to treat type 2 diabetes. Treatment is combined with diet and exercise. The medicine helps your body to use insulin better. This medicine may be used for other purposes; ask your health care provider or pharmacist if you have questions. What should I tell my health care provider before I take this medicine? They need to know if you have any of these conditions: -diabetic ketoacidosis -glucose-6-phosphate dehydrogenase deficiency -heart disease -kidney disease -liver disease -severe infection or injury -thyroid disease -an unusual or allergic reaction to glyburide, sulfa drugs, other medicines, foods, dyes, or preservatives -pregnant or trying to get pregnant -breast-feeding How should I use this medicine? Take this medicine by mouth with a glass of water. Follow the directions on the prescription label. If you take this medicine once a day, take it with breakfast or the first main meal of the day. Take your medicine at the same time each day. Do not take more often than directed. Talk to your pediatrician regarding the use of this medicine in children. Special care may be needed. Elderly patients over 26 years old may have a stronger reaction and need a smaller dose. Overdosage: If you think you have taken too much of this medicine contact a poison control center or emergency room at once. NOTE: This medicine is only for you. Do not share this medicine with others. What if I miss a dose? If you miss a dose, take it as soon as you can. If it is almost time for your next dose, take only that dose. Do not take double or extra doses. What may interact with this medicine? -bosentan -chloramphenicol -cisapride -clarithromycin -medicines for fungal or yeast infections -metoclopramide -probenecid -rifampin -warfarin Many medications may cause an increase or decrease in blood sugar, these include: -alcohol containing  beverages -angiotensin converting enzyme inhibitors like enalapril, captopril, and lisinopril -aspirin and aspirin-like drugs -chloramphenicol -chromium -female hormones, like estrogens or progestins and birth control pills -fluoxetine -heart medicines like disopyramide -isoniazid -female hormones or anabolic steroids -medicines called MAO Inhibitors like Nardil, Parnate, Marplan, Eldepryl -medicines for allergies, asthma, cold, or cough -medicines for mental problems -medicines for weight loss -niacin -NSAIDs, medicines for pain and inflammation, like ibuprofen or naproxen -pentamidine -phenytoin -probenecid -quinolone antibiotics like ciprofloxacin, levofloxacin, ofloxacin -some herbal dietary supplements -steroid medicines like prednisone or cortisone -thyroid medicine -water pills or diuretics This list may not describe all possible interactions. Give your health care provider a list of all the medicines, herbs, non-prescription drugs, or dietary supplements you use. Also tell them if you smoke, drink alcohol, or use illegal drugs. Some items may interact with your medicine. What should I watch for while using this medicine? Visit your doctor or health care professional for regular checks on your progress. A test called the HbA1C (A1C) will be monitored. This is a simple blood test. It measures your blood sugar control over the last 2 to 3 months. You will receive this test every 3 to 6 months. Learn how to check your blood sugar. Learn the symptoms of low and high blood sugar and how to manage them. Always carry a quick-source of sugar with you in case you have symptoms of low blood sugar. Examples include hard sugar candy or glucose tablets. Make sure others know that you can choke if you eat or drink when you develop serious symptoms of low blood sugar, such as seizures or unconsciousness. They must get medical help at once. Tell  your doctor or health care professional if you have  high blood sugar. You might need to change the dose of your medicine. If you are sick or exercising more than usual, you might need to change the dose of your medicine. Do not skip meals. Ask your doctor or health care professional if you should avoid alcohol. Many nonprescription cough and cold products contain sugar or alcohol. These can affect blood sugar. This medicine can make you more sensitive to the sun. Keep out of the sun. If you cannot avoid being in the sun, wear protective clothing and use sunscreen. Do not use sun lamps or tanning beds/booths. Wear a medical ID bracelet or chain, and carry a card that describes your disease and details of your medicine and dosage times. What side effects may I notice from receiving this medicine? Side effects that you should report to your doctor or health care professional as soon as possible: -allergic reactions like skin rash, itching or hives, swelling of the face, lips, or tongue -breathing problems -dark urine -fever, chills, sore throat -signs and symptoms of low blood sugar such as feeling anxious, confusion, dizziness, increased hunger, unusually weak or tired, sweating, shakiness, cold, irritable, headache, blurred vision, fast heartbeat, loss of consciousness -unusual bleeding or bruising -yellowing of the eyes or skin Side effects that usually do not require medical attention (report to your doctor or health care professional if they continue or are bothersome): -diarrhea -dizziness -headache -heartburn -nausea -stomach gas This list may not describe all possible side effects. Call your doctor for medical advice about side effects. You may report side effects to FDA at 1-800-FDA-1088. Where should I keep my medicine? Keep out of the reach of children. Store at room temperature between 15 and 30 degrees C (59 and 86 degrees F). Throw away any unused medicine after the expiration date. NOTE: This sheet is a summary. It may not cover  all possible information. If you have questions about this medicine, talk to your doctor, pharmacist, or health care provider.    2016, Elsevier/Gold Standard. (2013-02-20 14:44:31)

## 2016-08-16 LAB — CBC
HCT: 33.8 % — ABNORMAL LOW (ref 35.0–45.0)
Hemoglobin: 11.4 g/dL — ABNORMAL LOW (ref 11.7–15.5)
MCH: 28.6 pg (ref 27.0–33.0)
MCHC: 33.7 g/dL (ref 32.0–36.0)
MCV: 84.7 fL (ref 80.0–100.0)
MPV: 9.5 fL (ref 7.5–12.5)
PLATELETS: 246 10*3/uL (ref 140–400)
RBC: 3.99 MIL/uL (ref 3.80–5.10)
RDW: 13.8 % (ref 11.0–15.0)
WBC: 6.9 10*3/uL (ref 3.8–10.8)

## 2016-08-16 LAB — HIV ANTIBODY (ROUTINE TESTING W REFLEX): HIV 1&2 Ab, 4th Generation: NONREACTIVE

## 2016-08-16 LAB — RPR

## 2016-08-22 ENCOUNTER — Encounter (HOSPITAL_COMMUNITY): Payer: Self-pay

## 2016-09-01 ENCOUNTER — Ambulatory Visit (INDEPENDENT_AMBULATORY_CARE_PROVIDER_SITE_OTHER): Payer: BLUE CROSS/BLUE SHIELD | Admitting: Obstetrics and Gynecology

## 2016-09-01 VITALS — BP 126/74 | HR 102 | Wt 173.0 lb

## 2016-09-01 DIAGNOSIS — O0990 Supervision of high risk pregnancy, unspecified, unspecified trimester: Secondary | ICD-10-CM

## 2016-09-01 DIAGNOSIS — O99891 Other specified diseases and conditions complicating pregnancy: Secondary | ICD-10-CM

## 2016-09-01 DIAGNOSIS — O10913 Unspecified pre-existing hypertension complicating pregnancy, third trimester: Secondary | ICD-10-CM

## 2016-09-01 DIAGNOSIS — O9989 Other specified diseases and conditions complicating pregnancy, childbirth and the puerperium: Principal | ICD-10-CM

## 2016-09-01 DIAGNOSIS — R8271 Bacteriuria: Secondary | ICD-10-CM

## 2016-09-01 DIAGNOSIS — M329 Systemic lupus erythematosus, unspecified: Secondary | ICD-10-CM

## 2016-09-01 DIAGNOSIS — O24410 Gestational diabetes mellitus in pregnancy, diet controlled: Secondary | ICD-10-CM

## 2016-09-01 DIAGNOSIS — O10919 Unspecified pre-existing hypertension complicating pregnancy, unspecified trimester: Secondary | ICD-10-CM

## 2016-09-01 MED ORDER — ONDANSETRON 4 MG PO TBDP: 4.0000 mg | ORAL_TABLET | Freq: Four times a day (QID) | ORAL | 0 refills | Status: DC | PRN

## 2016-09-01 MED ORDER — GLUCOSE BLOOD VI STRP: ORAL_STRIP | 12 refills | Status: DC

## 2016-09-01 MED ORDER — ACCU-CHEK FASTCLIX LANCETS MISC: 1.0000 [IU] | Freq: Four times a day (QID) | 12 refills | Status: DC

## 2016-09-01 NOTE — Progress Notes (Signed)
   PRENATAL VISIT NOTE  Subjective:  Kayla [name] is a 25 y.o. G2P0010 at 8039w4d being seen today for ongoing prenatal care.  She is currently monitored for the following issues for this high-risk pregnancy and has Protein-calorie malnutrition, severe (HCC); Malar rash; Alopecia; Arthralgia; Interstitial lung disease (HCC); Dyspnea; Pneumomediastinum (HCC); Chronic respiratory failure with hypoxia (HCC); Connective tissue disease, undifferentiated (HCC); Supervision of high risk pregnancy, antepartum; Systemic lupus erythematosus (SLE) affecting pregnancy, antepartum (HCC); Lupus (systemic lupus erythematosus) (HCC); Other chest pain; Loud P2 (pulmonary S2, second heart sound); Encounter for long-term (current) use of high-risk medication; Insomnia; Other secondary pulmonary hypertension; Chronic hypertension; Chronic hypertension during pregnancy, antepartum; GBS bacteriuria; Sjogren's syndrome (HCC); Chronic Respiratory failure with oxygen requirement affecting pregnancy, antepartum; Intractable migraine with aura without status migrainosus; Diabetes mellitus in pregnancy, antepartum; Chronic UTI; SS-A antibody positive; SS-B antibody positive; and High risk pregnancy, antepartum on her problem list.  Patient reports onset of SOB a few weeks ago. She started using her oxygen again.  Contractions: Not present. Vag. Bleeding: None.  Movement: Present. Denies leaking of fluid.   The following portions of the patient's history were reviewed and updated as appropriate: allergies, current medications, past family history, past medical history, past social history, past surgical history and problem list. Problem list updated.  Objective:   Vitals:   09/01/16 1447  BP: 126/74  Pulse: (!) 102  Weight: 173 lb (78.5 kg)    Fetal Status: Fetal Heart Rate (bpm): 150 Fundal Height: 29 cm Movement: Present     General:  Alert, oriented and cooperative. Patient is in no acute distress.  Skin: Skin is warm  and dry. No rash noted.   Cardiovascular: Normal heart rate noted  Respiratory: Normal respiratory effort, no problems with respiration noted  Abdomen: Soft, gravid, appropriate for gestational age. Pain/Pressure: Absent     Pelvic:  Cervical exam deferred        Extremities: Normal range of motion.  Edema: None  Mental Status: Normal mood and affect. Normal behavior. Normal judgment and thought content.   Urinalysis:      Assessment and Plan:  Pregnancy: G2P0010 at 9039w4d  1. Systemic lupus erythematosus (SLE) affecting pregnancy, antepartum (HCC) Continue lovenox daily Follow up ultrasound ordered - US MFM OB FOLLOW UP; Future  2. Supervision of high risk pregnancy, antepartum  - US MFM OB FOLLOW UP; Future  3. GBS bacteriuria Will receive prophylaxis in labor  4. Diet controlled gestational diabetes mellitus (GDM), antepartum Patient has not been taking prescribed glyburide She also has not been checking CBGs for the past week. She doesn't remember her values prior to that Stressed the importance of checking CBGs and bringing log book  5. Chronic hypertension during pregnancy, antepartum Continue Norvasc and ASA  Preterm labor symptoms and general obstetric precautions including but not limited to vaginal bleeding, contractions, leaking of fluid and fetal movement were reviewed in detail with the patient. Please refer to After Visit Summary for other counseling recommendations.  Return in about 2 weeks (around 09/15/2016) for rob and NST.  Catalina AntiguaPeggy Ceriah Kohler, MD

## 2016-09-02 ENCOUNTER — Telehealth: Payer: Self-pay | Admitting: *Deleted

## 2016-09-02 NOTE — Telephone Encounter (Signed)
Kayla [name] left a voicemail this am stating she was seen in office yesterday and Dr. York SpanielSaid would prescribe an inhaler but it wasn't at the pharmacy.  I reviewed chart and did not see that Dr. Jolayne Pantheronstant had ordered an inhaler and that patient has not been prescribed one recently.  I called Rafael and we discussed this and that she has been on different inhalers in the past. She was not sure which one Dr. Jolayne Pantheronstant was going to prescribe.  She denies any distress at present. I instructed her to come to [facility] or closest hospital if she was having any acute respiratory issues.  I instructed her I will forward the request electronically to Dr. Jolayne Pantheronstant .  She voices understanding.

## 2016-09-04 ENCOUNTER — Other Ambulatory Visit: Payer: Self-pay | Admitting: Obstetrics and Gynecology

## 2016-09-04 MED ORDER — ALBUTEROL SULFATE HFA 108 (90 BASE) MCG/ACT IN AERS: 2.0000 | INHALATION_SPRAY | RESPIRATORY_TRACT | 5 refills | Status: DC | PRN

## 2016-09-05 ENCOUNTER — Encounter: Payer: Self-pay | Admitting: General Practice

## 2016-09-07 NOTE — Telephone Encounter (Addendum)
Dr. Jolayne Pantheronstant sent in a rx. I called [facility] and left a message her rx had been sent to her pharmacy a few days ago, call if any questions.

## 2016-09-09 MED ORDER — ALBUTEROL SULFATE HFA 108 (90 BASE) MCG/ACT IN AERS: 2.0000 | INHALATION_SPRAY | RESPIRATORY_TRACT | 5 refills | Status: DC | PRN

## 2016-09-15 ENCOUNTER — Other Ambulatory Visit: Payer: Self-pay | Admitting: Obstetrics and Gynecology

## 2016-09-15 ENCOUNTER — Ambulatory Visit (HOSPITAL_COMMUNITY)
Admission: RE | Admit: 2016-09-15 | Discharge: 2016-09-15 | Disposition: A | Discharge status: Home / Self Care | Payer: BLUE CROSS/BLUE SHIELD | Source: Ambulatory Visit | Attending: Obstetrics and Gynecology | Admitting: Obstetrics and Gynecology

## 2016-09-15 ENCOUNTER — Encounter (HOSPITAL_COMMUNITY): Payer: Self-pay

## 2016-09-15 DIAGNOSIS — O26893 Other specified pregnancy related conditions, third trimester: Secondary | ICD-10-CM | POA: Insufficient documentation

## 2016-09-15 DIAGNOSIS — O24410 Gestational diabetes mellitus in pregnancy, diet controlled: Secondary | ICD-10-CM | POA: Insufficient documentation

## 2016-09-15 DIAGNOSIS — O24419 Gestational diabetes mellitus in pregnancy, unspecified control: Secondary | ICD-10-CM

## 2016-09-15 DIAGNOSIS — O9989 Other specified diseases and conditions complicating pregnancy, childbirth and the puerperium: Secondary | ICD-10-CM

## 2016-09-15 DIAGNOSIS — M329 Systemic lupus erythematosus, unspecified: Secondary | ICD-10-CM

## 2016-09-15 DIAGNOSIS — O0990 Supervision of high risk pregnancy, unspecified, unspecified trimester: Secondary | ICD-10-CM

## 2016-09-15 DIAGNOSIS — Z3A32 32 weeks gestation of pregnancy: Secondary | ICD-10-CM | POA: Insufficient documentation

## 2016-09-15 DIAGNOSIS — O10919 Unspecified pre-existing hypertension complicating pregnancy, unspecified trimester: Secondary | ICD-10-CM

## 2016-09-15 DIAGNOSIS — O99519 Diseases of the respiratory system complicating pregnancy, unspecified trimester: Secondary | ICD-10-CM

## 2016-09-15 DIAGNOSIS — I10 Essential (primary) hypertension: Secondary | ICD-10-CM

## 2016-09-15 DIAGNOSIS — O10013 Pre-existing essential hypertension complicating pregnancy, third trimester: Secondary | ICD-10-CM | POA: Diagnosis not present

## 2016-09-19 ENCOUNTER — Other Ambulatory Visit (HOSPITAL_COMMUNITY): Payer: Self-pay | Admitting: Maternal and Fetal Medicine

## 2016-09-19 ENCOUNTER — Ambulatory Visit (INDEPENDENT_AMBULATORY_CARE_PROVIDER_SITE_OTHER): Payer: BLUE CROSS/BLUE SHIELD | Admitting: Obstetrics and Gynecology

## 2016-09-19 VITALS — BP 126/70 | HR 104 | Wt 175.0 lb

## 2016-09-19 DIAGNOSIS — R768 Other specified abnormal immunological findings in serum: Secondary | ICD-10-CM

## 2016-09-19 DIAGNOSIS — M329 Systemic lupus erythematosus, unspecified: Secondary | ICD-10-CM

## 2016-09-19 DIAGNOSIS — O10919 Unspecified pre-existing hypertension complicating pregnancy, unspecified trimester: Secondary | ICD-10-CM

## 2016-09-19 DIAGNOSIS — O9989 Other specified diseases and conditions complicating pregnancy, childbirth and the puerperium: Secondary | ICD-10-CM

## 2016-09-19 DIAGNOSIS — D6862 Lupus anticoagulant syndrome: Secondary | ICD-10-CM

## 2016-09-19 DIAGNOSIS — O10019 Pre-existing essential hypertension complicating pregnancy, unspecified trimester: Secondary | ICD-10-CM

## 2016-09-19 DIAGNOSIS — O24410 Gestational diabetes mellitus in pregnancy, diet controlled: Secondary | ICD-10-CM

## 2016-09-19 DIAGNOSIS — O10913 Unspecified pre-existing hypertension complicating pregnancy, third trimester: Secondary | ICD-10-CM | POA: Diagnosis not present

## 2016-09-19 DIAGNOSIS — O99113 Other diseases of the blood and blood-forming organs and certain disorders involving the immune mechanism complicating pregnancy, third trimester: Secondary | ICD-10-CM | POA: Diagnosis not present

## 2016-09-19 DIAGNOSIS — R8271 Bacteriuria: Secondary | ICD-10-CM

## 2016-09-19 DIAGNOSIS — Z3A36 36 weeks gestation of pregnancy: Secondary | ICD-10-CM

## 2016-09-19 DIAGNOSIS — I2729 Other secondary pulmonary hypertension: Secondary | ICD-10-CM

## 2016-09-19 DIAGNOSIS — O99891 Other specified diseases and conditions complicating pregnancy: Secondary | ICD-10-CM

## 2016-09-19 DIAGNOSIS — O0990 Supervision of high risk pregnancy, unspecified, unspecified trimester: Secondary | ICD-10-CM

## 2016-09-19 DIAGNOSIS — O2690 Pregnancy related conditions, unspecified, unspecified trimester: Secondary | ICD-10-CM

## 2016-09-19 NOTE — Addendum Note (Signed)
Addended by: Jill SideAY, Lateefa Crosby L on: 09/19/2016 04:46 PM   Modules accepted: Orders

## 2016-09-19 NOTE — Progress Notes (Signed)
Subjective:  Kayla [name] is a 25 y.o. G2P0010 at 4220w1d being seen today for ongoing prenatal care.  She is currently monitored for the following issues for this high-risk pregnancy and has Protein-calorie malnutrition, severe (HCC); Malar rash; Alopecia; Arthralgia; Interstitial lung disease (HCC); Dyspnea; Chronic respiratory failure with hypoxia (HCC); Connective tissue disease, undifferentiated (HCC); Supervision of high risk pregnancy, antepartum; Systemic lupus erythematosus (SLE) affecting pregnancy, antepartum (HCC); Lupus (systemic lupus erythematosus) (HCC); Other chest pain; Loud P2 (pulmonary S2, second heart sound); Encounter for long-term (current) use of high-risk medication; Insomnia; Other secondary pulmonary hypertension; Chronic hypertension; Chronic hypertension during pregnancy, antepartum; GBS bacteriuria; Sjogren's syndrome (HCC); Chronic Respiratory failure with oxygen requirement affecting pregnancy, antepartum; Diabetes mellitus in pregnancy, antepartum; Chronic UTI; SS-A antibody positive; SS-B antibody positive; and High risk pregnancy, antepartum on her problem list.  Patient reports sinus congestion.  Contractions: Not present.  .  Movement: Present. Denies leaking of fluid.   The following portions of the patient's history were reviewed and updated as appropriate: allergies, current medications, past family history, past medical history, past social history, past surgical history and problem list. Problem list updated.  Objective:   Vitals:   09/19/16 1422  BP: 126/70  Pulse: (!) 104  Weight: 175 lb (79.4 kg)    Fetal Status: Fetal Heart Rate (bpm): 136 Fundal Height: 33 cm Movement: Present     General:  Alert, oriented and cooperative. Patient is in no acute distress.  Skin: Skin is warm and dry. No rash noted.   Cardiovascular: Normal heart rate noted  Respiratory: Normal respiratory effort, no problems with respiration noted  Abdomen: Soft, gravid,  appropriate for gestational age. Pain/Pressure: Absent     Pelvic:  Cervical exam deferred        Extremities: Normal range of motion.     Mental Status: Normal mood and affect. Normal behavior. Normal judgment and thought content.   Urinalysis:      Assessment and Plan:  Pregnancy: G2P0010 at 2820w1d  1. Chronic hypertension during pregnancy, antepartum Stable  Continue with Norvasc and BASA  Antenatal testing twice weekly  2. High risk pregnancy, antepartum As above  3. SS-B antibody positive   4. SS-A antibody positive   5. GBS bacteriuria Will treat while in labor  6. Systemic lupus erythematosus (SLE) affecting pregnancy, antepartum (HCC) Continue with prednisone and Imuran.  7. Supervision of high risk pregnancy, antepartum As above  Gestational DM Pt did not bring BS readings again today Stressed importance of bringing readings to office visits Reports unable to tolerate Glyburide due to GI intolerance Reports FBS meaning goal, dinner PP Bs is elevated at times  Pulmonary HTN Followed by pulmonary.  Stable Preterm labor symptoms and general obstetric precautions including but not limited to vaginal bleeding, contractions, leaking of fluid and fetal movement were reviewed in detail with the patient. Please refer to After Visit Summary for other counseling recommendations.  No Follow-up on file.   Hermina StaggersMichael L Tarrell Debes, MD

## 2016-09-26 ENCOUNTER — Other Ambulatory Visit: Payer: Self-pay | Admitting: Family Medicine

## 2016-09-29 ENCOUNTER — Other Ambulatory Visit: Payer: Self-pay

## 2016-09-29 ENCOUNTER — Telehealth: Payer: Self-pay | Admitting: *Deleted

## 2016-09-29 NOTE — Telephone Encounter (Signed)
Called pt and left message stating that I am calling to check on her due to having 2 missed appointments this week. I wanted to be sure she is ok and not ahving any problems.These appointments are important for the care of her and her baby. She will need 2 appointments for next week. Please call back to schedule. She can speak with a nurse if she has questions. Pt will need Ob fu and NST on 11/13 then NST/AFI on 11/16 or 11/17.

## 2016-10-12 ENCOUNTER — Encounter (HOSPITAL_COMMUNITY): Payer: Self-pay

## 2016-10-12 ENCOUNTER — Ambulatory Visit (HOSPITAL_COMMUNITY)
Admission: RE | Admit: 2016-10-12 | Discharge: 2016-10-12 | Disposition: A | Discharge status: Home / Self Care | Payer: BLUE CROSS/BLUE SHIELD | Source: Ambulatory Visit | Attending: Obstetrics and Gynecology | Admitting: Obstetrics and Gynecology

## 2016-10-17 ENCOUNTER — Encounter: Payer: Self-pay | Admitting: Obstetrics & Gynecology

## 2016-10-17 ENCOUNTER — Other Ambulatory Visit: Payer: Self-pay | Admitting: Obstetrics & Gynecology

## 2016-12-05 ENCOUNTER — Ambulatory Visit (HOSPITAL_COMMUNITY)
Admission: EM | Admit: 2016-12-05 | Discharge: 2016-12-05 | Disposition: A | Discharge status: Home / Self Care | Payer: BLUE CROSS/BLUE SHIELD | Attending: Family Medicine | Admitting: Family Medicine

## 2016-12-05 ENCOUNTER — Encounter (HOSPITAL_COMMUNITY): Payer: Self-pay | Admitting: Emergency Medicine

## 2016-12-05 DIAGNOSIS — M5442 Lumbago with sciatica, left side: Secondary | ICD-10-CM

## 2016-12-05 DIAGNOSIS — G8929 Other chronic pain: Secondary | ICD-10-CM

## 2016-12-05 DIAGNOSIS — M5441 Lumbago with sciatica, right side: Secondary | ICD-10-CM

## 2016-12-05 MED ORDER — PREDNISONE 20 MG PO TABS: ORAL_TABLET | ORAL | 0 refills | Status: DC

## 2016-12-05 MED ORDER — CYCLOBENZAPRINE HCL 10 MG PO TABS: 10.0000 mg | ORAL_TABLET | Freq: Two times a day (BID) | ORAL | 0 refills | Status: DC | PRN

## 2016-12-05 NOTE — ED Provider Notes (Signed)
CSN: [id number]     Arrival date & time 12/05/16  1820 History   None    Chief Complaint  Patient presents with  . Back Pain   (Consider location/radiation/quality/duration/timing/severity/associated sxs/prior Treatment) HPI Kayla [name] is a 26 y.o. female presenting to UC with c/o exacerbation of lower back pain that radiates down her legs.  Pain is aching and sore, 10/10 at its worst. She notes pain started in November 2017 after an epidural.  She also reports hx of Lupus and arthritis in her hands and legs but she cannot f/u with her rheumatologist until next month.  She had been prescribed percocet for her back pain but is out of it.  She is on 43m of prednisone daily for her Lupus.  Denies change in bowel or bladder habits. No fever or chills. No recent injuries.    Past Medical History:  Diagnosis Date  . Arthritis    "hands and legs" (01/08/2015)  . CAP (community acquired pneumonia) 01/07/2015  . Daily headache    "sometimes" (01/08/2015)  . GERD (gastroesophageal reflux disease)   . Hypertension   . Lung disease   . Lupus   . Pulmonary hypertension   . Sjogren's syndrome ([facility]    Past Surgical History:  Procedure Laterality Date  . CARDIAC CATHETERIZATION N/A 09/09/2015   Procedure: Right Heart Cath;  Surgeon: DLarey Dresser MD;  [facility];  Service: Cardiovascular;  Laterality: N/A;  . DILATION AND EVACUATION N/A 07/13/2015   Procedure: DILATATION AND EVACUATION;  Surgeon: JTruett Mainland DO;  [facility];  Service: Gynecology;  Laterality: N/A;  . FINGER SURGERY Right 03/2014   "laceration, nerve/artery injury" 2nd digit  . VIDEO BRONCHOSCOPY Bilateral 01/12/2015   Procedure: VIDEO BRONCHOSCOPY WITH FLUORO;  Surgeon: RCollene Gobble MD;  [place] [facility]  Service: Cardiopulmonary;  Laterality: Bilateral;   Family History  Problem Relation Age of Onset  . Diabetes Mother   . Diabetes Maternal Aunt   . Diabetes Maternal Grandmother     Social History  Substance Use Topics  . Smoking status: Former Smoker    Packs/day: 0.10    Years: 5.00    Types: Cigarettes    Quit date: 11/20/2014  . Smokeless tobacco: Never Used  . Alcohol use No     Comment: 01/08/2015 "last drink was New Year's Eve"   OB History    Gravida Para Term Preterm AB Living   2 0 0 0 1 0   SAB TAB Ectopic Multiple Live Births   1 0 0 0       Review of Systems  Constitutional: Negative for chills and fever.  Genitourinary: Negative for dysuria, flank pain and hematuria.  Musculoskeletal: Positive for arthralgias, back pain and myalgias. Negative for neck pain and neck stiffness.  Skin: Negative for color change and wound.  Neurological: Negative for weakness and numbness.    Allergies  Hydrocodone and Zithromax [azithromycin]  Home Medications   Prior to Admission medications   Medication Sig Start Date End Date Taking? Authorizing Provider  ACCU-CHEK FASTCLIX LANCETS MISC 1 Units by Percutaneous route 4 (four) times daily. 09/01/16  Yes Peggy Constant, MD  albuterol (PROVENTIL HFA;VENTOLIN HFA) 108 (90 Base) MCG/ACT inhaler Inhale 2 puffs into the lungs every 4 (four) hours as needed for wheezing or shortness of breath. 09/09/16  Yes Peggy Constant, MD  amLODipine (NORVASC) 5 MG tablet Take 5 mg by mouth daily.   Yes Historical Provider, MD  aspirin  81 MG chewable tablet Chew 1 tablet (81 mg total) by mouth daily. 05/16/16  Yes Donnamae Jude, MD  azaTHIOprine (IMURAN) 50 MG tablet Take 50 mg by mouth 3 (three) times daily. Reported on 04/28/2016   Yes Historical Provider, MD  Blood Glucose Monitoring Suppl (ACCU-CHEK NANO SMARTVIEW) w/Device KIT 1 Device by Does not apply route as directed. Check fasting, and two hours after breakfast, lunch and dinner 05/16/16  Yes Donnamae Jude, MD  folic acid (FOLVITE) 1 MG tablet Take 1 tablet by mouth daily. 03/29/16 03/29/17 Yes Historical Provider, MD  glucose blood (ACCU-CHEK SMARTVIEW) test strip Check  blood sugars 4x/daily 09/01/16  Yes Peggy Constant, MD  glyBURIDE (DIABETA) 2.5 MG tablet Take 1 tablet (2.5 mg total) by mouth daily with breakfast. 07/27/16  Yes Donnamae Jude, MD  ondansetron (ZOFRAN ODT) 4 MG disintegrating tablet Take 1 tablet (4 mg total) by mouth every 6 (six) hours as needed for nausea. 09/01/16  Yes Peggy Constant, MD  predniSONE (DELTASONE) 5 MG tablet Take 15 mg by mouth daily with breakfast. Take 3 tablets daily.   Yes Historical Provider, MD  Prenatal Multivit-Min-Fe-FA (PRENATAL VITAMINS PO) Take 1 tablet by mouth daily.   Yes Historical Provider, MD  promethazine (PHENERGAN) 25 MG tablet Take 1 tablet (25 mg total) by mouth every 6 (six) hours as needed for nausea or vomiting. 08/15/16  Yes Seabron Spates, CNM  cyclobenzaprine (FLEXERIL) 10 MG tablet Take 1 tablet (10 mg total) by mouth 2 (two) times daily as needed for muscle spasms. 12/05/16   Noland Fordyce, PA-C  predniSONE (DELTASONE) 20 MG tablet 3 tabs po daily x 3 days, then 2 tabs x 3 days, then 1.5 tabs x 3 days, then 1 tab x 3 days, then 0.5 tabs x 3 days 12/05/16   Noland Fordyce, PA-C   Meds Ordered and Administered this Visit  Medications - No data to display  BP 134/93 (BP Location: Right Arm)   Pulse 86   Temp 98.4 F (36.9 C) (Oral)   Resp 16   LMP 02/04/2016 (Exact Date)   SpO2 98%   Breastfeeding? No  No data found.   Physical Exam  Constitutional: She is oriented to person, place, and time. She appears well-developed and well-nourished. No distress.  Pt sitting comfortably on exam bed, NAD  HENT:  Head: Normocephalic and atraumatic.  Eyes: EOM are normal.  Neck: Normal range of motion.  Cardiovascular: Normal rate.   Pulmonary/Chest: Effort normal.  Musculoskeletal: Normal range of motion. She exhibits tenderness. She exhibits no edema.  No midline spinal tenderness. Tenderness to Left and Right lumbar muscles. Full ROM upper and lower extremities with 5/5 strength.  Neurological: She  is alert and oriented to person, place, and time.  Reflex Scores:      Patellar reflexes are 2+ on the right side and 2+ on the left side. Skin: Skin is warm and dry. No rash noted. She is not diaphoretic. No erythema.  Psychiatric: She has a normal mood and affect. Her behavior is normal.  Nursing note and vitals reviewed.   Urgent Care Course   Clinical Course     Procedures (including critical care time)  Labs Review Labs Reviewed - No data to display  Imaging Review No results found.    MDM   1. Chronic bilateral low back pain with bilateral sciatica    Pt c/o exacerbation of chronic lower back pain.  No red flag symptoms. No indication for imaging  at this time.  Will treat conservatively.  Rx: Flexeril and 2 week prednisone taper. Encouraged to f/u with PCP or rheumatologist for chronic back pain as narcotics will not be prescribed for chronic pain in UC.   Noland Fordyce, PA-C 12/05/16 2125

## 2016-12-05 NOTE — Discharge Instructions (Signed)
Cyclobenzaprine (Flexeril) is a muscle relaxer and may cause drowsiness. Do not drink alcohol, drive, or operate heavy machinery while taking.  Emergency Department Resource Guide 1) Find a Doctor and Pay Out of Pocket Although you won't have to find out who is covered by your insurance plan, it is a good idea to ask around and get recommendations. You will then need to call the office and see if the doctor you have chosen will accept you as a new patient and what types of options they offer for patients who are self-pay. Some doctors offer discounts or will set up payment plans for their patients who do not have insurance, but you will need to ask so you aren't surprised when you get to your appointment.  2) Contact Your Local Health Department Not all health departments have doctors that can see patients for sick visits, but many do, so it is worth a call to see if yours does. If you don't know where your local health department is, you can check in your phone book. The CDC also has a tool to help you locate your state's health department, and many state websites also have listings of all of their local health departments.  3) Find a Walk-in Clinic If your illness is not likely to be very severe or complicated, you may want to try a walk in clinic. These are popping up all over the country in pharmacies, drugstores, and shopping centers. They're usually staffed by nurse practitioners or physician assistants that have been trained to treat common illnesses and complaints. They're usually fairly quick and inexpensive. However, if you have serious medical issues or chronic medical problems, these are probably not your best option.  No Primary Care Doctor: - Call Health Connect at  [phone] - they can help you locate a primary care doctor that  accepts your insurance, provides certain services, etc. - Physician Referral Service- [phone]  Chronic Pain Problems: Organization          Address  Phone   Notes  [facility]  [phone] Patients need to be referred by their primary care doctor.   Medication Assistance: Organization         Address  Phone   Notes  [facility] Medication [facility] [address] [place] [place], [place] [address] [phone] --Must be a resident of [facility] -- Must have NO insurance coverage whatsoever (no Medicaid/ Medicare, etc.) -- The pt. MUST have a primary care doctor that directs their care regularly and follows them in the community   [facility]ist  [phone]   [employer]  [phone]    Agencies that provide inexpensive medical care: Buyer, retail  Notes  [facility]  [phone]   [facility]    [phone]   [facility] [address] [place], [place] [address] [phone]) [phone]   [facility] [place] [address], [place] [phone]   Planned Parenthood    [phone]   [facility]    [phone]   [facility]  [address], [place] Phone:  [phone], Fax:  [phone]) [phone] Hours of Operation:  9 am - 6 pm, M-F.  Also accepts Medicaid/Medicare and self-pay.  [facility]  [address], [place] Phone: [phone], Fax: [phone]. Hours of Operation:  8:30 am - 5:30 pm, M-F.  Also accepts Medicaid and self-pay.  [facility] [place] [address], [place] Phone: [phone]) [phone]   [facility] [address], [place], [place] [phone], Ext. [phone] Mondays & Thursdays: 7-9 AM.  First 15 patients are seen on a first come, first serve basis.    Medicaid-accepting [facility] Providers:  Organization         Address                                                                        Phone                               Notes  [facility] [address], [place] [phone]) [phone] Also accepts self-pay patients.  [facility] [address], [place]  [phone]   [facility] [address], [address], [place] [phone](336) [phone]   [facility] [address] [place] [address], [place] [phone]) [phone]   Renaye RakersVeita Bland [address], [place]   [phone] Only accepts [place] Access [place] patients after they have their name applied to their card.   Self-Pay (no insurance) in [facility]:   Organization         Address                                                     Phone               Notes  Sickle Cell Patients, [facility] [address], [place] [phone]) [phone]   [facility] [address], [place] [phone]   [facility] [place]  [address], [place] [phone]   [facility]/Dr. Osei-Bonsu  [address] [place] [address], [place] or [address], [place] [phone] Phone number for both [place] and [place] locations is the same.  [facility] [address], [place] [phone]) [phone]   [facility] [place] [address] [place] [address], [place] or [address] [phone]   [facility] [address], [place] [phone]) [phone], phone; [phone]) [phone], fax Sees patients 1st and 3rd Saturday of every month.  Must not qualify for public or private insurance (i.e. Medicaid, Medicare, [place] Health Choice, Veterans' Benefits)  Household income should be no more than 200% of the poverty level The clinic cannot treat you if you are pregnant or think you are pregnant  Sexually transmitted diseases are not treated at the clinic.    Dental  Care: Organization         Address                                  Phone                       Notes  [facility] [address], [place] [phone] Accepts children up to age 27 who are enrolled in [place]icaid or [place] Health Choice; pregnant women with a Medicaid card; and children who have applied for Medicaid or [place] Health Choice, but were declined, whose parents can pay a reduced fee at time of service.  [facility] [place]  [address], [place] [phone] Accepts children up to age 27 who are enrolled in [place]icaid or [place] Health Choice; pregnant women with a Medicaid card; and children who have applied for Medicaid or [place] Health Choice, but were declined, whose parents can pay a reduced fee at time of service.  [facility]  [address], [place] [phone]) [phone] Patients are seen by appointment only. Walk-ins are not accepted. [facility] will see patients 27 years of age and older. Monday - Tuesday (8am-5pm) Most Wednesdays (8:30-5pm) $30 per visit, cash only  [facility]  [address], [facility] [phone]) [phone] Patients are seen by appointment only. Walk-ins are not accepted. [facility] will see patients 27 years of age and older. One Wednesday Evening (Monthly: Volunteer Based).  $30 per visit, cash only  [employer] [facility] [facility]  [phone] for adults; Children under age 584, call Graduate Pediatric Dentistry at [phone]. Children aged 254-14, please call [phone] to request a pediatric application.  Dental services are provided in all areas of dental care including fillings, crowns and bridges, complete and partial dentures, implants, gum treatment, root canals, and extractions. Preventive care is also provided. Treatment is provided to both adults and children. Patients are selected via a  lottery and there is often a waiting list.   [facility] [address], [place]  [phone] [email or web address]   [facility] [address], [place], [place] [phone], Ext. [phone] Second and Fourth Thursday of each month, opens at 6:30 AM; Clinic ends at 9 AM.  Patients are seen on a first-come first-served basis, and a limited number are seen during each clinic.   [facility]  [address], [place], [place] [phone]   Eligibility Requirements You must have lived in [place], [place], or [place] counties for at least the last three months.   You cannot be eligible for state or federal sponsored National Cityhealthcare insurance, including CIGNAVeterans Administration, [place]icaid, or [employer] EntertainmentMedicare.   You generally cannot be eligible for healthcare insurance through your employer.    How to apply: Eligibility screenings are held every  Tuesday and Wednesday afternoon from 1:00 pm until 4:00 pm. You do not need an appointment for the interview!  [facility] [address], [place], [place] [phone]   [facility]  [phone]   [facility]  [phone]   [facility]  [phone]    Behavioral Health Resources in the Community: Intensive Outpatient Programs Organization         Address                                              Phone              Notes  [facility] [address], [place], [place] [phone]   [facility] [address], [place], [place] [phone]   [facility] [address], [place], [place]  [phone]   [facility] [address],  [place], [place] [phone] or [phone]   Substance Abuse Resources Organization         Address                                Phone  Notes  [facility]  [phone]   [facility]   [phone]   [facility]  [phone]   [facility]  [phone]   Residential & Outpatient Substance Abuse Program  [phone]   Psychological Services Organization         Address                                  Phone                Notes  [facility]  [phone]- [phone]   [facility]  [phone]- [phone]   [facility] [address], [place] [phone] or [phone]    Mobile Crisis Teams Organization         Address  Phone  Notes  [facility], Mobile Crisis Care Unit  [phone]   [facility]  [address] [place], [place] [phone]   Doristine LocksSharon DeEsch [address] [place] [phone]    Self-Help/Support Groups Organization         Address                         Phone             Notes  [facility] [place] - variety of support groups  336- I7437963([phone] Call for more information  Narcotics Anonymous (NA), [facility] [address], [place]  2 meetings at this location   Statisticianesidential Treatment Programs Organization         Address                                                    Phone              Notes  [facility] [phone]  [address],    [place]  [phone]   [facility]  [address], [place] [phone], [place], [place] [phone]   [facility] [address], [place] [phone] Admissions: 8am-3pm M-F  [facility] [address]    [place], [place] [phone]   [facility] [address], [place], [place] [phone]   [facility] [address]  [place], [place] [phone]   [facility] - Intensive Outpatient [address], [place], [place] [phone]   [facility] ([facility] [address]  [place], [place] [phone] or [phone]   [facility] ([facility]) [address] [place], [place] [phone] Accepts Medicaid  [facility] [address]  [place] [phone] Substance Abuse/Addiction Treatment   [facility] Resources Organization         Address                                                            Phone                    Notes  [facility]  [phone]   Angie FavaJulie Brannon, PhD [address], [address] [place], [place]   [phone](336) [phone] or [phone]   [facility]   [address] [place], [place] [phone]) [phone]   [facility] [address], [place], [place] [phone](336) [phone] Insurance/Medicaid/sponsorship through [facility]  Faith and Families [address]                                    [place], [place] [phone](336) [phone] Therapy/tele-psych/case  [facility] [address].   [place], [place] [phone](336) [phone]    Dr. Lolly MustacheArfeen  [phone]) [phone]   [facility] [place]  [employer] [facility] 1) [address], [place] 2) [address], [place] 3)  [address], [place] [phone]  [phone](336) [phone]   [facility] Child Abuse Hotline [phone]) [phone] or [phone](336) [phone] (After Hours)

## 2016-12-05 NOTE — ED Triage Notes (Signed)
[facility] a complaint of back and leg pain that started in November after an epidural.

## 2017-01-16 ENCOUNTER — Emergency Department (HOSPITAL_COMMUNITY): Payer: BLUE CROSS/BLUE SHIELD

## 2017-01-16 ENCOUNTER — Emergency Department (HOSPITAL_COMMUNITY)
Admission: EM | Admit: 2017-01-16 | Discharge: 2017-01-16 | Disposition: A | Discharge status: Home / Self Care | Payer: BLUE CROSS/BLUE SHIELD | Attending: Emergency Medicine | Admitting: Emergency Medicine

## 2017-01-16 ENCOUNTER — Encounter (HOSPITAL_COMMUNITY): Payer: Self-pay | Admitting: *Deleted

## 2017-01-16 DIAGNOSIS — Z7982 Long term (current) use of aspirin: Secondary | ICD-10-CM | POA: Diagnosis not present

## 2017-01-16 DIAGNOSIS — R1030 Lower abdominal pain, unspecified: Secondary | ICD-10-CM | POA: Diagnosis not present

## 2017-01-16 DIAGNOSIS — Z79899 Other long term (current) drug therapy: Secondary | ICD-10-CM | POA: Insufficient documentation

## 2017-01-16 DIAGNOSIS — Z87891 Personal history of nicotine dependence: Secondary | ICD-10-CM | POA: Insufficient documentation

## 2017-01-16 DIAGNOSIS — I10 Essential (primary) hypertension: Secondary | ICD-10-CM | POA: Diagnosis not present

## 2017-01-16 DIAGNOSIS — R109 Unspecified abdominal pain: Secondary | ICD-10-CM | POA: Diagnosis present

## 2017-01-16 LAB — CBC
HCT: 37.9 % (ref 36.0–46.0)
HEMOGLOBIN: 12.7 g/dL (ref 12.0–15.0)
MCH: 28.3 pg (ref 26.0–34.0)
MCHC: 33.5 g/dL (ref 30.0–36.0)
MCV: 84.4 fL (ref 78.0–100.0)
PLATELETS: 225 10*3/uL (ref 150–400)
RBC: 4.49 MIL/uL (ref 3.87–5.11)
RDW: 12.6 % (ref 11.5–15.5)
WBC: 4.4 10*3/uL (ref 4.0–10.5)

## 2017-01-16 LAB — URINALYSIS, ROUTINE W REFLEX MICROSCOPIC
BILIRUBIN URINE: NEGATIVE
GLUCOSE, UA: NEGATIVE mg/dL
Hgb urine dipstick: NEGATIVE
KETONES UR: NEGATIVE mg/dL
LEUKOCYTES UA: NEGATIVE
Nitrite: POSITIVE — AB
PROTEIN: NEGATIVE mg/dL
Specific Gravity, Urine: 1.017 (ref 1.005–1.030)
pH: 7 (ref 5.0–8.0)

## 2017-01-16 LAB — POC URINE PREG, ED: Preg Test, Ur: NEGATIVE

## 2017-01-16 LAB — COMPREHENSIVE METABOLIC PANEL
ALT: 10 U/L — ABNORMAL LOW (ref 14–54)
ANION GAP: 9 (ref 5–15)
AST: 19 U/L (ref 15–41)
Albumin: 3.3 g/dL — ABNORMAL LOW (ref 3.5–5.0)
Alkaline Phosphatase: 78 U/L (ref 38–126)
BUN: 5 mg/dL — ABNORMAL LOW (ref 6–20)
CO2: 25 mmol/L (ref 22–32)
Calcium: 8.7 mg/dL — ABNORMAL LOW (ref 8.9–10.3)
Chloride: 104 mmol/L (ref 101–111)
Creatinine, Ser: 0.73 mg/dL (ref 0.44–1.00)
Glucose, Bld: 80 mg/dL (ref 65–99)
Potassium: 3.5 mmol/L (ref 3.5–5.1)
Sodium: 138 mmol/L (ref 135–145)
Total Bilirubin: 0.3 mg/dL (ref 0.3–1.2)
Total Protein: 6.4 g/dL — ABNORMAL LOW (ref 6.5–8.1)

## 2017-01-16 LAB — WET PREP, GENITAL
Clue Cells Wet Prep HPF POC: NONE SEEN
SPERM: NONE SEEN
TRICH WET PREP: NONE SEEN
Yeast Wet Prep HPF POC: NONE SEEN

## 2017-01-16 LAB — LIPASE, BLOOD: LIPASE: 18 U/L (ref 11–51)

## 2017-01-16 MED ORDER — NAPROXEN 500 MG PO TABS: 500.0000 mg | ORAL_TABLET | Freq: Two times a day (BID) | ORAL | 0 refills | Status: DC

## 2017-01-16 MED ORDER — KETOROLAC TROMETHAMINE 60 MG/2ML IM SOLN
60.0000 mg | Freq: Once | INTRAMUSCULAR | Status: AC
  Administered 2017-01-16: 60 mg via INTRAMUSCULAR
  Filled 2017-01-16: qty 2

## 2017-01-16 MED ORDER — OXYCODONE-ACETAMINOPHEN 5-325 MG PO TABS
1.0000 | ORAL_TABLET | Freq: Once | ORAL | Status: AC
  Administered 2017-01-16: 1 via ORAL
  Filled 2017-01-16: qty 1

## 2017-01-16 NOTE — ED Notes (Signed)
Pt stable, understands discharge instructions, and reasons for return.   

## 2017-01-16 NOTE — ED Triage Notes (Signed)
The pt is c/o abd pain for 3-4 days and she is also c/o a redness lt eys that appeared yesterday   Sl nausea diarrhea  lmp feb 9th

## 2017-01-16 NOTE — ED Notes (Signed)
Patient ambulated to the room independently.  No signs of distress noted 

## 2017-01-16 NOTE — ED Provider Notes (Signed)
[facility] DEPT Provider Note   CSN: [id number] Arrival date & time: 01/16/17  1651     History   Chief Complaint Chief Complaint  Patient presents with  . Abdominal Pain  . Eye Problem    HPI Kayla [name] is a 26 y.o. female.  Patient is a 26 year old female who has a history of Sjogren's syndrome, lupus, hypertension presenting today with pain in her left side and pelvis that started yesterday. She noticed her urine smelled stronger today but denies any dysuria, frequency or urgency. She had some mild loose stool over the last few days been a normal bowel movement yesterday. Pain in the left side and pelvis is a 9 out of 10 and sharp in nature. It does not radiate. It is not improved and seems to be worse with lying on that side or movement.No new vaginal discharge. She was last sexually active for months ago. LMP 12/30/2016. No nausea or vomiting.   The history is provided by the patient.    Past Medical History:  Diagnosis Date  . Arthritis    "hands and legs" (01/08/2015)  . CAP (community acquired pneumonia) 01/07/2015  . Daily headache    "sometimes" (01/08/2015)  . GERD (gastroesophageal reflux disease)   . Hypertension   . Lung disease   . Lupus   . Pulmonary hypertension   . Sjogren's syndrome [facility])     Patient Active Problem List   Diagnosis Date Noted  . High risk pregnancy, antepartum 06/17/2016  . 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 Chronic UTI 05/19/2016  . Chronic Respiratory failure with oxygen requirement affecting pregnancy, antepartum 05/16/2016  . Diabetes mellitus in pregnancy, antepartum 05/16/2016  . GBS bacteriuria 04/19/2016  . Chronic hypertension during pregnancy, antepartum 04/14/2016  . Chronic hypertension 03/14/2016  . Other secondary pulmonary hypertension 09/04/2015  . Lupus (systemic lupus erythematosus) ([place]) 08/21/2015  . Other chest pain 08/21/2015  . Loud P2 (pulmonary S2, second  heart sound) 08/21/2015  . Encounter for long-term (current) use of high-risk medication 08/21/2015  . Insomnia 08/21/2015  . Supervision of high risk pregnancy, antepartum 07/06/2015  . Systemic lupus erythematosus (SLE) affecting pregnancy, antepartum ([place]) 07/06/2015  . Chronic respiratory failure with hypoxia ([place]) 05/21/2015  . 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 Sjogren's syndrome ([place]) 04/28/2015  . Interstitial lung disease ([place]) 02/06/2015  . Dyspnea 02/06/2015  . Malar rash 01/09/2015  . Alopecia 01/09/2015  . Arthralgia 01/09/2015  . Protein-calorie malnutrition, severe ([place]) 01/08/2015    Past Surgical History:  Procedure Laterality Date  . CARDIAC CATHETERIZATION N/A 09/09/2015   Procedure: Right Heart Cath;  Surgeon: Larey Dresser, MD;  Location: [facility];  Service: Cardiovascular;  Laterality: N/A;  . DILATION AND EVACUATION N/A 07/13/2015   Procedure: DILATATION AND EVACUATION;  Surgeon: Truett Mainland, DO;  Location: [facility];  Service: Gynecology;  Laterality: N/A;  . FINGER SURGERY Right 03/2014   "laceration, nerve/artery injury" 2nd digit  . VIDEO BRONCHOSCOPY Bilateral 01/12/2015   Procedure: VIDEO BRONCHOSCOPY WITH FLUORO;  Surgeon: Collene Gobble, MD;  Location: [facility];  Service: Cardiopulmonary;  Laterality: Bilateral;    OB History    Gravida Para Term Preterm AB Living   2 0 0 0 1 0   SAB TAB Ectopic Multiple Live Births   1 0 0 0         Home Medications    Prior to Admission medications   Medication Sig  Start Date End Date Taking? Authorizing Provider  ACCU-CHEK FASTCLIX LANCETS MISC 1 Units by Percutaneous route 4 (four) times daily. 09/01/16   Peggy Constant, MD  albuterol (PROVENTIL HFA;VENTOLIN HFA) 108 (90 Base) MCG/ACT inhaler Inhale 2 puffs into the lungs every 4 (four) hours as needed for wheezing or shortness of breath. 09/09/16   Peggy Constant, MD  amLODipine (NORVASC) 5 MG tablet Take 5 mg by  mouth daily.    Historical Provider, MD  aspirin 81 MG chewable tablet Chew 1 tablet (81 mg total) by mouth daily. 05/16/16   Donnamae Jude, MD  azaTHIOprine (IMURAN) 50 MG tablet Take 50 mg by mouth 3 (three) times daily. Reported on 04/28/2016    Historical Provider, MD  Blood Glucose Monitoring Suppl (ACCU-CHEK NANO SMARTVIEW) w/Device KIT 1 Device by Does not apply route as directed. Check fasting, and two hours after breakfast, lunch and dinner 05/16/16   Donnamae Jude, MD  cyclobenzaprine (FLEXERIL) 10 MG tablet Take 1 tablet (10 mg total) by mouth 2 (two) times daily as needed for muscle spasms. 12/05/16   Noland Fordyce, PA-C  folic acid (FOLVITE) 1 MG tablet Take 1 tablet by mouth daily. 03/29/16 03/29/17  Historical Provider, MD  glucose blood (ACCU-CHEK SMARTVIEW) test strip Check blood sugars 4x/daily 09/01/16   Peggy Constant, MD  glyBURIDE (DIABETA) 2.5 MG tablet Take 1 tablet (2.5 mg total) by mouth daily with breakfast. 07/27/16   Donnamae Jude, MD  ondansetron (ZOFRAN ODT) 4 MG disintegrating tablet Take 1 tablet (4 mg total) by mouth every 6 (six) hours as needed for nausea. 09/01/16   Peggy Constant, MD  predniSONE (DELTASONE) 20 MG tablet 3 tabs po daily x 3 days, then 2 tabs x 3 days, then 1.5 tabs x 3 days, then 1 tab x 3 days, then 0.5 tabs x 3 days 12/05/16   Noland Fordyce, PA-C  predniSONE (DELTASONE) 5 MG tablet Take 15 mg by mouth daily with breakfast. Take 3 tablets daily.    Historical Provider, MD  Prenatal Multivit-Min-Fe-FA (PRENATAL VITAMINS PO) Take 1 tablet by mouth daily.    Historical Provider, MD  promethazine (PHENERGAN) 25 MG tablet Take 1 tablet (25 mg total) by mouth every 6 (six) hours as needed for nausea or vomiting. 08/15/16   Seabron Spates, CNM    Family History Family History  Problem Relation Age of Onset  . Diabetes Mother   . Diabetes Maternal Aunt   . Diabetes Maternal Grandmother     Social History Social History  Substance Use Topics  . Smoking  status: Former Smoker    Packs/day: 0.10    Years: 5.00    Types: Cigarettes    Quit date: 11/20/2014  . Smokeless tobacco: Never Used  . Alcohol use No     Comment: 01/08/2015 "last drink was New Year's Eve"     Allergies   Hydrocodone and Zithromax [azithromycin]   Review of Systems Review of Systems  Eyes: Positive for redness.       3-4 days ago she noticed some pain in her left eye and when she woke up there was blood on the outside part of her eye. She has denied any matting, watering or drainage from the eye. She does not use contacts. She states she has a mild pain in her right eye but no other symptoms at this time.  All other systems reviewed and are negative.    Physical Exam Updated Vital Signs BP 126/78   Pulse  94   Temp 99.1 F (37.3 C) (Oral)   Resp 17   Ht 5' 3"  (1.6 m)   Wt 168 lb (76.2 kg)   LMP 12/30/2016   SpO2 96%   BMI 29.76 kg/m   Physical Exam  Constitutional: She is oriented to person, place, and time. She appears well-developed and well-nourished. No distress.  HENT:  Head: Normocephalic and atraumatic.  Mouth/Throat: Oropharynx is clear and moist.  Eyes: EOM are normal. Pupils are equal, round, and reactive to light. Left conjunctiva has a hemorrhage.  Neck: Normal range of motion. Neck supple.  Cardiovascular: Normal rate, regular rhythm and intact distal pulses.   No murmur heard. Pulmonary/Chest: Effort normal and breath sounds normal. No respiratory distress. She has no wheezes. She has no rales.  Abdominal: Soft. She exhibits no distension. There is tenderness in the left lower quadrant. There is no rebound, no guarding and no CVA tenderness.    Genitourinary: Uterus normal. Cervix exhibits discharge. Cervix exhibits no motion tenderness and no friability. Right adnexum displays no mass, no tenderness and no fullness. Left adnexum displays tenderness. Left adnexum displays no mass and no fullness. No bleeding in the vagina.    Musculoskeletal: Normal range of motion. She exhibits no edema or tenderness.  Neurological: She is alert and oriented to person, place, and time.  Skin: Skin is warm and dry. No rash noted. No erythema.  Psychiatric: She has a normal mood and affect. Her behavior is normal.  Nursing note and vitals reviewed.    ED Treatments / Results  Labs (all labs ordered are listed, but only abnormal results are displayed) Labs Reviewed  WET PREP, GENITAL - Abnormal; Notable for the following:       Result Value   WBC, Wet Prep HPF POC MANY (*)    All other components within normal limits  COMPREHENSIVE METABOLIC PANEL - Abnormal; Notable for the following:    BUN 5 (*)    Calcium 8.7 (*)    Total Protein 6.4 (*)    Albumin 3.3 (*)    ALT 10 (*)    All other components within normal limits  URINALYSIS, ROUTINE W REFLEX MICROSCOPIC - Abnormal; Notable for the following:    APPearance HAZY (*)    Nitrite POSITIVE (*)    Bacteria, UA FEW (*)    Squamous Epithelial / LPF 0-5 (*)    All other components within normal limits  LIPASE, BLOOD  CBC  POC URINE PREG, ED  GC/CHLAMYDIA PROBE AMP ([place]) NOT AT [facility]    EKG  EKG Interpretation None       Radiology US Transvaginal Non-ob  Result Date: 01/16/2017 CLINICAL DATA:  Pelvic pain.  Clinical concern for ovarian torsion. EXAM: TRANSABDOMINAL AND TRANSVAGINAL ULTRASOUND OF PELVIS DOPPLER ULTRASOUND OF OVARIES TECHNIQUE: Both transabdominal and transvaginal ultrasound examinations of the pelvis were performed. Transabdominal technique was performed for global imaging of the pelvis including uterus, ovaries, adnexal regions, and pelvic cul-de-sac. It was necessary to proceed with endovaginal exam following the transabdominal exam to visualize the right and left ovary. Color and duplex Doppler ultrasound was utilized to evaluate blood flow to the ovaries. COMPARISON:  Pelvic ultrasound 09/25/2015 FINDINGS: Uterus Measurements: 8.6 x 4.9 x  6.0 cm. No fibroids or other mass visualized. Endometrium Thickness: 11 mm.  No focal abnormality visualized. Right ovary Measurements: 3.2 x 2.2 x 2.3 cm. Collapsed cyst in the right ovary measures 2 cm, may be a corpus luteum. Physiologic follicles are seen.  Normal blood flow. No adnexal mass. Left ovary Measurements: 3.7 x 2.2 x 2.7 cm. Normal appearance/no adnexal mass. Follicular cyst measures 1.9 cm. Normal blood flow. Pulsed Doppler evaluation of both ovaries demonstrates normal low-resistance arterial and venous waveforms. Other findings Small to moderate simple free fluid in the pelvis. IMPRESSION: 1. Collapsed right ovarian cyst, may be a corpus luteum. Physiologic follicular cyst in the left ovary. Normal blood flow to both ovaries without torsion. No dedicated imaging follow-up is needed. 2. Normal sonographic appearance of the uterus. Electronically Signed   By: Jeb Levering M.D.   On: 01/16/2017 22:29   US Pelvis Complete  Result Date: 01/16/2017 CLINICAL DATA:  Pelvic pain.  Clinical concern for ovarian torsion. EXAM: TRANSABDOMINAL AND TRANSVAGINAL ULTRASOUND OF PELVIS DOPPLER ULTRASOUND OF OVARIES TECHNIQUE: Both transabdominal and transvaginal ultrasound examinations of the pelvis were performed. Transabdominal technique was performed for global imaging of the pelvis including uterus, ovaries, adnexal regions, and pelvic cul-de-sac. It was necessary to proceed with endovaginal exam following the transabdominal exam to visualize the right and left ovary. Color and duplex Doppler ultrasound was utilized to evaluate blood flow to the ovaries. COMPARISON:  Pelvic ultrasound 09/25/2015 FINDINGS: Uterus Measurements: 8.6 x 4.9 x 6.0 cm. No fibroids or other mass visualized. Endometrium Thickness: 11 mm.  No focal abnormality visualized. Right ovary Measurements: 3.2 x 2.2 x 2.3 cm. Collapsed cyst in the right ovary measures 2 cm, may be a corpus luteum. Physiologic follicles are seen. Normal  blood flow. No adnexal mass. Left ovary Measurements: 3.7 x 2.2 x 2.7 cm. Normal appearance/no adnexal mass. Follicular cyst measures 1.9 cm. Normal blood flow. Pulsed Doppler evaluation of both ovaries demonstrates normal low-resistance arterial and venous waveforms. Other findings Small to moderate simple free fluid in the pelvis. IMPRESSION: 1. Collapsed right ovarian cyst, may be a corpus luteum. Physiologic follicular cyst in the left ovary. Normal blood flow to both ovaries without torsion. No dedicated imaging follow-up is needed. 2. Normal sonographic appearance of the uterus. Electronically Signed   By: Jeb Levering M.D.   On: 01/16/2017 22:29   [place] Art/ven Flow Abd Pelv Doppler  Result Date: 01/16/2017 CLINICAL DATA:  Pelvic pain.  Clinical concern for ovarian torsion. EXAM: TRANSABDOMINAL AND TRANSVAGINAL ULTRASOUND OF PELVIS DOPPLER ULTRASOUND OF OVARIES TECHNIQUE: Both transabdominal and transvaginal ultrasound examinations of the pelvis were performed. Transabdominal technique was performed for global imaging of the pelvis including uterus, ovaries, adnexal regions, and pelvic cul-de-sac. It was necessary to proceed with endovaginal exam following the transabdominal exam to visualize the right and left ovary. Color and duplex Doppler ultrasound was utilized to evaluate blood flow to the ovaries. COMPARISON:  Pelvic ultrasound 09/25/2015 FINDINGS: Uterus Measurements: 8.6 x 4.9 x 6.0 cm. No fibroids or other mass visualized. Endometrium Thickness: 11 mm.  No focal abnormality visualized. Right ovary Measurements: 3.2 x 2.2 x 2.3 cm. Collapsed cyst in the right ovary measures 2 cm, may be a corpus luteum. Physiologic follicles are seen. Normal blood flow. No adnexal mass. Left ovary Measurements: 3.7 x 2.2 x 2.7 cm. Normal appearance/no adnexal mass. Follicular cyst measures 1.9 cm. Normal blood flow. Pulsed Doppler evaluation of both ovaries demonstrates normal low-resistance arterial and venous  waveforms. Other findings Small to moderate simple free fluid in the pelvis. IMPRESSION: 1. Collapsed right ovarian cyst, may be a corpus luteum. Physiologic follicular cyst in the left ovary. Normal blood flow to both ovaries without torsion. No dedicated imaging follow-up is needed. 2. Normal sonographic appearance  of the uterus. Electronically Signed   By: Jeb Levering M.D.   On: 01/16/2017 22:29    Procedures Procedures (including critical care time)  Medications Ordered in ED Medications  ketorolac (TORADOL) injection 60 mg (not administered)     Initial Impression / Assessment and Plan / ED Course  I have reviewed the triage vital signs and the nursing notes.  Pertinent labs & imaging results that were available during my care of the patient were reviewed by me and considered in my medical decision making (see chart for details).     Patient is a 26 year old pt presenting today with left-sided abdominal and pelvic pain that started yesterday. She denies any infectious symptoms. She noticed a strong smell of her urine but denies any frequency, urgency or dysuria. Patient does have a significant history of chronic UTIs.  On exam she does have left lower quadrant and pelvic tenderness. On pelvic exam she does have left adnexal tenderness. Low suspicion for tubo-ovarian abscess or STD as patient has not been sexually active for the last 4 months. Urine pregnancy test is negative. UA with positive nitrites but 0 white blood cells and few bacteria. Patient symptoms could be related to an ovarian cyst or possibly torsion. Low suspicion for diverticulitis or GI pathology. She has no symptoms suggestive of appendicitis, pancreatitis or hepatitis. Lipase within normal limits, CMP within normal limits. Patient given Toradol and ultrasound to rule out torsion or ovarian cyst pending  11:16 PM Pain not significantly improved with Toradol and she was given 1 Percocet improvement. Ultrasound shows  right ovarian cyst that is decompressed but no other significant findings. No evidence of torsion. Wet prep with white blood cells but no other significant findings. Discussed findings with patient. At this time unclear the cause of patient's pain. Could be viral in origin could be related to ovaries.  Will discharge home with pain medicine and given strict return precautions.  When checking the database patient has had 126 oxycodone tablets 7.5 prescribed in the last 3 months. At this time patient will be given naproxen.  Final Clinical Impressions(s) / ED Diagnoses   Final diagnoses:  Lower abdominal pain    New Prescriptions New Prescriptions   NAPROXEN (NAPROSYN) 500 MG TABLET    Take 1 tablet (500 mg total) by mouth 2 (two) times daily with a meal.     Blanchie Dessert, MD 01/16/17 [id number]

## 2017-01-16 NOTE — ED Notes (Signed)
Patient transported to Ultrasound 

## 2017-01-16 NOTE — ED Notes (Signed)
MD and EMT at bedside for pelvic exam.  Patient tolerated well.

## 2017-01-17 LAB — GC/CHLAMYDIA PROBE AMP (~~LOC~~) NOT AT ARMC
Chlamydia: POSITIVE — AB
Neisseria Gonorrhea: POSITIVE — AB

## 2017-01-19 ENCOUNTER — Emergency Department (HOSPITAL_COMMUNITY)
Admission: EM | Admit: 2017-01-19 | Discharge: 2017-01-19 | Disposition: A | Discharge status: Home / Self Care | Payer: Medicare Other | Attending: Emergency Medicine | Admitting: Emergency Medicine

## 2017-01-19 ENCOUNTER — Encounter (HOSPITAL_COMMUNITY): Payer: Self-pay

## 2017-01-19 DIAGNOSIS — I10 Essential (primary) hypertension: Secondary | ICD-10-CM | POA: Diagnosis not present

## 2017-01-19 DIAGNOSIS — Z7982 Long term (current) use of aspirin: Secondary | ICD-10-CM | POA: Insufficient documentation

## 2017-01-19 DIAGNOSIS — Z7984 Long term (current) use of oral hypoglycemic drugs: Secondary | ICD-10-CM | POA: Diagnosis not present

## 2017-01-19 DIAGNOSIS — Z87891 Personal history of nicotine dependence: Secondary | ICD-10-CM | POA: Insufficient documentation

## 2017-01-19 DIAGNOSIS — Z202 Contact with and (suspected) exposure to infections with a predominantly sexual mode of transmission: Secondary | ICD-10-CM | POA: Diagnosis not present

## 2017-01-19 DIAGNOSIS — A64 Unspecified sexually transmitted disease: Secondary | ICD-10-CM

## 2017-01-19 DIAGNOSIS — Z79899 Other long term (current) drug therapy: Secondary | ICD-10-CM | POA: Insufficient documentation

## 2017-01-19 MED ORDER — DOXYCYCLINE HYCLATE 100 MG PO CAPS: 100.0000 mg | ORAL_CAPSULE | Freq: Two times a day (BID) | ORAL | 0 refills | Status: AC

## 2017-01-19 MED ORDER — CEFTRIAXONE SODIUM 250 MG IJ SOLR
250.0000 mg | Freq: Once | INTRAMUSCULAR | Status: AC
  Administered 2017-01-19: 250 mg via INTRAMUSCULAR
  Filled 2017-01-19: qty 250

## 2017-01-19 MED ORDER — LIDOCAINE HCL (PF) 1 % IJ SOLN
INTRAMUSCULAR | Status: AC
  Administered 2017-01-19: 5 mL
  Filled 2017-01-19: qty 5

## 2017-01-19 MED ORDER — DOXYCYCLINE HYCLATE 100 MG PO TABS
100.0000 mg | ORAL_TABLET | Freq: Once | ORAL | Status: AC
  Administered 2017-01-19: 100 mg via ORAL
  Filled 2017-01-19: qty 1

## 2017-01-19 NOTE — ED Triage Notes (Signed)
Pt here for follow up for positive GC swab. She needs abx treatment.

## 2017-01-19 NOTE — ED Provider Notes (Signed)
[facility] DEPT Provider Note   CSN: [id number] Arrival date & time: 01/19/17  1409     History   Chief Complaint Chief Complaint  Patient presents with  . Follow-up    HPI Kayla [name] is a 26 y.o. female.  HPI Patient presents 3 days after recent evaluation, now with concern for positive STD results. She denies substantial new complaints, including fever, syncope, new pain.    Past Medical History:  Diagnosis Date  . Arthritis    "hands and legs" (01/08/2015)  . CAP (community acquired pneumonia) 01/07/2015  . Daily headache    "sometimes" (01/08/2015)  . GERD (gastroesophageal reflux disease)   . Hypertension   . Lung disease   . Lupus   . Pulmonary hypertension   . Sjogren's syndrome [facility])     Patient Active Problem List   Diagnosis Date Noted  . High risk pregnancy, antepartum 06/17/2016  . 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 Chronic UTI 05/19/2016  . Chronic Respiratory failure with oxygen requirement affecting pregnancy, antepartum 05/16/2016  . Diabetes mellitus in pregnancy, antepartum 05/16/2016  . GBS bacteriuria 04/19/2016  . Chronic hypertension during pregnancy, antepartum 04/14/2016  . Chronic hypertension 03/14/2016  . Other secondary pulmonary hypertension 09/04/2015  . Lupus (systemic lupus erythematosus) ([place]) 08/21/2015  . Other chest pain 08/21/2015  . Loud P2 (pulmonary S2, second heart sound) 08/21/2015  . Encounter for long-term (current) use of high-risk medication 08/21/2015  . Insomnia 08/21/2015  . Supervision of high risk pregnancy, antepartum 07/06/2015  . Systemic lupus erythematosus (SLE) affecting pregnancy, antepartum ([place]) 07/06/2015  . Chronic respiratory failure with hypoxia ([place]) 05/21/2015  . 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 Sjogren's syndrome ([place]) 04/28/2015  . Interstitial lung disease ([place]) 02/06/2015  . Dyspnea 02/06/2015  . Malar rash  01/09/2015  . Alopecia 01/09/2015  . Arthralgia 01/09/2015  . Protein-calorie malnutrition, severe ([place]) 01/08/2015    Past Surgical History:  Procedure Laterality Date  . CARDIAC CATHETERIZATION N/A 09/09/2015   Procedure: Right Heart Cath;  Surgeon: Larey Dresser, MD;  Location: [place] [facility];  Service: Cardiovascular;  Laterality: N/A;  . DILATION AND EVACUATION N/A 07/13/2015   Procedure: DILATATION AND EVACUATION;  Surgeon: Truett Mainland, DO;  Location: [place] [facility];  Service: Gynecology;  Laterality: N/A;  . FINGER SURGERY Right 03/2014   "laceration, nerve/artery injury" 2nd digit  . VIDEO BRONCHOSCOPY Bilateral 01/12/2015   Procedure: VIDEO BRONCHOSCOPY WITH FLUORO;  Surgeon: Collene Gobble, MD;  Location: [facility];  Service: Cardiopulmonary;  Laterality: Bilateral;    OB History    Gravida Para Term Preterm AB Living   2 0 0 0 1 0   SAB TAB Ectopic Multiple Live Births   1 0 0 0         Home Medications    Prior to Admission medications   Medication Sig Start Date End Date Taking? Authorizing Provider  ACCU-CHEK FASTCLIX LANCETS MISC 1 Units by Percutaneous route 4 (four) times daily. Patient not taking: Reported on 01/16/2017 09/01/16   Mora Bellman, MD  albuterol (PROVENTIL HFA;VENTOLIN HFA) 108 (90 Base) MCG/ACT inhaler Inhale 2 puffs into the lungs every 4 (four) hours as needed for wheezing or shortness of breath. 09/09/16   Peggy Constant, MD  amLODipine (NORVASC) 5 MG tablet Take 5 mg by mouth daily.    Historical Provider, MD  aspirin 81 MG chewable tablet Chew 1 tablet (81 mg total) by mouth daily. 05/16/16  Donnamae Jude, MD  azaTHIOprine (IMURAN) 50 MG tablet Take 50 mg by mouth 3 (three) times daily. Reported on 04/28/2016    Historical Provider, MD  Blood Glucose Monitoring Suppl (ACCU-CHEK NANO SMARTVIEW) w/Device KIT 1 Device by Does not apply route as directed. Check fasting, and two hours after breakfast, lunch and dinner Patient not taking: Reported  on 01/16/2017 05/16/16   Donnamae Jude, MD  cyclobenzaprine (FLEXERIL) 10 MG tablet Take 1 tablet (10 mg total) by mouth 2 (two) times daily as needed for muscle spasms. Patient not taking: Reported on 01/16/2017 12/05/16   Noland Fordyce, PA-C  doxycycline (VIBRAMYCIN) 100 MG capsule Take 1 capsule (100 mg total) by mouth 2 (two) times daily. 01/19/17 01/26/17  Carmin Muskrat, MD  glucose blood (ACCU-CHEK SMARTVIEW) test strip Check blood sugars 4x/daily Patient not taking: Reported on 01/16/2017 09/01/16   Mora Bellman, MD  glyBURIDE (DIABETA) 2.5 MG tablet Take 1 tablet (2.5 mg total) by mouth daily with breakfast. Patient not taking: Reported on 01/16/2017 07/27/16   Donnamae Jude, MD  naproxen (NAPROSYN) 500 MG tablet Take 1 tablet (500 mg total) by mouth 2 (two) times daily with a meal. 01/16/17   Blanchie Dessert, MD  ondansetron (ZOFRAN ODT) 4 MG disintegrating tablet Take 1 tablet (4 mg total) by mouth every 6 (six) hours as needed for nausea. Patient not taking: Reported on 01/16/2017 09/01/16   Mora Bellman, MD  predniSONE (DELTASONE) 20 MG tablet 3 tabs po daily x 3 days, then 2 tabs x 3 days, then 1.5 tabs x 3 days, then 1 tab x 3 days, then 0.5 tabs x 3 days Patient not taking: Reported on 01/16/2017 12/05/16   Noland Fordyce, PA-C  predniSONE (DELTASONE) 5 MG tablet Take 15 mg by mouth daily with breakfast.     Historical Provider, MD  promethazine (PHENERGAN) 25 MG tablet Take 1 tablet (25 mg total) by mouth every 6 (six) hours as needed for nausea or vomiting. Patient not taking: Reported on 01/16/2017 08/15/16   Seabron Spates, CNM    Family History Family History  Problem Relation Age of Onset  . Diabetes Mother   . Diabetes Maternal Aunt   . Diabetes Maternal Grandmother     Social History Social History  Substance Use Topics  . Smoking status: Former Smoker    Packs/day: 0.10    Years: 5.00    Types: Cigarettes    Quit date: 11/20/2014  . Smokeless tobacco: Never Used  .  Alcohol use No     Comment: 01/08/2015 "last drink was New Year's Eve"     Allergies   Hydrocodone and Zithromax [azithromycin]   Review of Systems Review of Systems  Constitutional: Negative for fever.  Respiratory: Negative for shortness of breath.   Cardiovascular: Negative for chest pain.  Musculoskeletal:       Negative aside from HPI  Skin:       Negative aside from HPI  Allergic/Immunologic: Negative for immunocompromised state.  Neurological: Negative for weakness.     Physical Exam Updated Vital Signs BP 129/92 (BP Location: Left Arm)   Temp 99.1 F (37.3 C) (Oral)   Resp 16   Ht _0  (1.6 m)   Wt 168 lb (76.2 kg)   LMP 12/30/2016   SpO2 98%   BMI 29.76 kg/m   Physical Exam  Constitutional: She is oriented to person, place, and time. She appears well-developed and well-nourished. No distress.  HENT:  Head: Normocephalic and atraumatic.  Eyes: Conjunctivae and EOM are normal.  Pulmonary/Chest: Effort normal. No stridor. No respiratory distress.  Musculoskeletal: She exhibits no edema.  Neurological: She is alert and oriented to person, place, and time. No cranial nerve deficit.  Skin: Skin is warm and dry.  Psychiatric: She has a normal mood and affect.  Nursing note and vitals reviewed.    ED Treatments / Results  Labsfrom 2 days ago reviewed, patient has negative pregnancy result, positive chlamydia, positive gonorrhea.   Procedures Procedures (including critical care time)  Medications Ordered in ED Medications  cefTRIAXone (ROCEPHIN) injection 250 mg (not administered)  doxycycline (VIBRA-TABS) tablet 100 mg (not administered)     Initial Impression / Assessment and Plan / ED Course  I have reviewed the triage vital signs and the nursing notes.  Pertinent labs & imaging results that were available during my care of the patient were reviewed by me and considered in my medical decision making (see chart for details).  And female presents  due to positive STD results after evaluation 2 days ago. With no new complaints, low suspicion for bacteremia, sepsis, but the patient does require initiation of treatment. Initial intramuscular injection, as well as oral antibiotics started. Patient discharged with resources to obtain primary care follow-up.  Final Clinical Impressions(s) / ED Diagnoses   Final diagnoses:  STD (female)    New Prescriptions New Prescriptions   DOXYCYCLINE (VIBRAMYCIN) 100 MG CAPSULE    Take 1 capsule (100 mg total) by mouth 2 (two) times daily.     Carmin Muskrat, MD 01/19/17 [phone]

## 2017-01-19 NOTE — Discharge Instructions (Signed)
As discussed, your evaluation today has been largely reassuring.  But, it is important that you monitor your condition carefully, and do not hesitate to return to the ED if you develop new, or concerning changes in your condition. ? ?Otherwise, please follow-up with your physician for appropriate ongoing care. ? ?

## 2017-01-30 ENCOUNTER — Emergency Department (HOSPITAL_COMMUNITY)
Admission: EM | Admit: 2017-01-30 | Discharge: 2017-01-30 | Disposition: A | Discharge status: Home / Self Care | Payer: Medicare Other | Attending: Emergency Medicine | Admitting: Emergency Medicine

## 2017-01-30 ENCOUNTER — Emergency Department (HOSPITAL_COMMUNITY): Payer: Medicare Other

## 2017-01-30 ENCOUNTER — Encounter (HOSPITAL_COMMUNITY): Payer: Self-pay | Admitting: *Deleted

## 2017-01-30 DIAGNOSIS — Z3A01 Less than 8 weeks gestation of pregnancy: Secondary | ICD-10-CM | POA: Diagnosis not present

## 2017-01-30 DIAGNOSIS — Z87891 Personal history of nicotine dependence: Secondary | ICD-10-CM | POA: Insufficient documentation

## 2017-01-30 DIAGNOSIS — O26891 Other specified pregnancy related conditions, first trimester: Secondary | ICD-10-CM | POA: Diagnosis present

## 2017-01-30 DIAGNOSIS — I10 Essential (primary) hypertension: Secondary | ICD-10-CM | POA: Diagnosis not present

## 2017-01-30 DIAGNOSIS — Z79899 Other long term (current) drug therapy: Secondary | ICD-10-CM | POA: Insufficient documentation

## 2017-01-30 DIAGNOSIS — Z7982 Long term (current) use of aspirin: Secondary | ICD-10-CM | POA: Insufficient documentation

## 2017-01-30 DIAGNOSIS — O3680X0 Pregnancy with inconclusive fetal viability, not applicable or unspecified: Secondary | ICD-10-CM

## 2017-01-30 DIAGNOSIS — Z3491 Encounter for supervision of normal pregnancy, unspecified, first trimester: Secondary | ICD-10-CM

## 2017-01-30 LAB — URINALYSIS, ROUTINE W REFLEX MICROSCOPIC
BILIRUBIN URINE: NEGATIVE
Glucose, UA: NEGATIVE mg/dL
HGB URINE DIPSTICK: NEGATIVE
KETONES UR: NEGATIVE mg/dL
Leukocytes, UA: NEGATIVE
Nitrite: NEGATIVE
PH: 5 (ref 5.0–8.0)
Protein, ur: NEGATIVE mg/dL
Specific Gravity, Urine: 1.025 (ref 1.005–1.030)

## 2017-01-30 LAB — CBC
HCT: 39.8 % (ref 36.0–46.0)
Hemoglobin: 13.2 g/dL (ref 12.0–15.0)
MCH: 28.1 pg (ref 26.0–34.0)
MCHC: 33.2 g/dL (ref 30.0–36.0)
MCV: 84.9 fL (ref 78.0–100.0)
Platelets: 249 10*3/uL (ref 150–400)
RBC: 4.69 MIL/uL (ref 3.87–5.11)
RDW: 12.7 % (ref 11.5–15.5)
WBC: 5.6 10*3/uL (ref 4.0–10.5)

## 2017-01-30 LAB — COMPREHENSIVE METABOLIC PANEL
ALBUMIN: 3.6 g/dL (ref 3.5–5.0)
ALK PHOS: 67 U/L (ref 38–126)
ALT: 11 U/L — ABNORMAL LOW (ref 14–54)
AST: 22 U/L (ref 15–41)
Anion gap: 7 (ref 5–15)
BILIRUBIN TOTAL: 0.6 mg/dL (ref 0.3–1.2)
BUN: 5 mg/dL — AB (ref 6–20)
CALCIUM: 9.1 mg/dL (ref 8.9–10.3)
CO2: 27 mmol/L (ref 22–32)
Chloride: 104 mmol/L (ref 101–111)
Creatinine, Ser: 0.69 mg/dL (ref 0.44–1.00)
GFR calc Af Amer: >60 mL/min (ref >60)
GFR calc non Af Amer: >60 mL/min (ref >60)
GLUCOSE: 85 mg/dL (ref 65–99)
Potassium: 3.7 mmol/L (ref 3.5–5.1)
Sodium: 138 mmol/L (ref 135–145)
Total Protein: 7 g/dL (ref 6.5–8.1)

## 2017-01-30 LAB — I-STAT BETA HCG BLOOD, ED (MC, WL, AP ONLY): I-stat hCG, quantitative: 718.9 m[IU]/mL — ABNORMAL HIGH (ref <5)

## 2017-01-30 LAB — WET PREP, GENITAL
CLUE CELLS WET PREP: NONE SEEN
Sperm: NONE SEEN
TRICH WET PREP: NONE SEEN
YEAST WET PREP: NONE SEEN

## 2017-01-30 LAB — LIPASE, BLOOD: Lipase: 16 U/L (ref 11–51)

## 2017-01-30 MED ORDER — PRENATAL COMPLETE 14-0.4 MG PO TABS: 1.0000 | ORAL_TABLET | Freq: Every day | ORAL | 0 refills | Status: DC

## 2017-01-30 NOTE — Discharge Instructions (Signed)
-   Present to Women's clinic in 2 days (48 hours) for repeat labs and to establish care - No alcohol - Do not take Azathioprine (Imuran) while pregnant, unless cleared by your Lupus doctor. Call your doctor first thing in the morning to discuss medication changes. - Do not take Naproxen, Alleve, Ibuprofen, or other NSAIDs

## 2017-01-30 NOTE — ED Provider Notes (Signed)
[facility] DEPT Provider Note   CSN: [id number] Arrival date & time: 01/30/17  1353     History   Chief Complaint Chief Complaint  Patient presents with  . Routine Prenatal Visit  . Abdominal Pain    HPI Kayla [name] is a 26 y.o. female.  HPI   26 year old G3 P0 at estimated [redacted] weeks gestational age who with lower abdominal pain. Patient was just recently seen on February 26. She was diagnosed with PID and treated appropriately. Her pregnancy was negative. Since then, her left lower quadrant pain has resolved but she has developed right lower quadrant and suprapubic pain. She has had associated nausea but no vomiting. She has had no vaginal bleeding or vaginal discharge. She denies any dyspareunia. She is sexually active. She took a home pregnancy test which was positive and she subsequent presents for evaluation. The abdominal pain is primarily suprapubic and right lower quadrant. It is aching, gnawing, and cramp-like. Denies any specific alleviating or aggravating factors.  Past Medical History:  Diagnosis Date  . Arthritis    "hands and legs" (01/08/2015)  . CAP (community acquired pneumonia) 01/07/2015  . Daily headache    "sometimes" (01/08/2015)  . GERD (gastroesophageal reflux disease)   . Hypertension   . Lung disease   . Lupus   . Pulmonary hypertension   . Sjogren's syndrome [facility]     Patient Active Problem List   Diagnosis Date Noted  . High risk pregnancy, antepartum 06/17/2016  . 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 Chronic UTI 05/19/2016  . Chronic Respiratory failure with oxygen requirement affecting pregnancy, antepartum 05/16/2016  . Diabetes mellitus in pregnancy, antepartum 05/16/2016  . GBS bacteriuria 04/19/2016  . Chronic hypertension during pregnancy, antepartum 04/14/2016  . Chronic hypertension 03/14/2016  . Other secondary pulmonary hypertension 09/04/2015  . Lupus (systemic lupus erythematosus) ([place])  08/21/2015  . Other chest pain 08/21/2015  . Loud P2 (pulmonary S2, second heart sound) 08/21/2015  . Encounter for long-term (current) use of high-risk medication 08/21/2015  . Insomnia 08/21/2015  . Supervision of high risk pregnancy, antepartum 07/06/2015  . Systemic lupus erythematosus (SLE) affecting pregnancy, antepartum ([place]) 07/06/2015  . Chronic respiratory failure with hypoxia ([place]) 05/21/2015  . 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 Sjogren's syndrome ([place]) 04/28/2015  . Interstitial lung disease ([place]) 02/06/2015  . Dyspnea 02/06/2015  . Malar rash 01/09/2015  . Alopecia 01/09/2015  . Arthralgia 01/09/2015  . Protein-calorie malnutrition, severe ([place]) 01/08/2015    Past Surgical History:  Procedure Laterality Date  . CARDIAC CATHETERIZATION N/A 09/09/2015   Procedure: Right Heart Cath;  Surgeon: Larey Dresser, MD;  Location: [facility];  Service: Cardiovascular;  Laterality: N/A;  . DILATION AND EVACUATION N/A 07/13/2015   Procedure: DILATATION AND EVACUATION;  Surgeon: Truett Mainland, DO;  Location: [facility];  Service: Gynecology;  Laterality: N/A;  . FINGER SURGERY Right 03/2014   "laceration, nerve/artery injury" 2nd digit  . VIDEO BRONCHOSCOPY Bilateral 01/12/2015   Procedure: VIDEO BRONCHOSCOPY WITH FLUORO;  Surgeon: Collene Gobble, MD;  Location: [place];  Service: Cardiopulmonary;  Laterality: Bilateral;    OB History    Gravida Para Term Preterm AB Living   2 0 0 0 1 0   SAB TAB Ectopic Multiple Live Births   1 0 0 0         Home Medications    Prior to Admission medications   Medication Sig Start Date End  Date Taking? Authorizing Provider  amLODipine (NORVASC) 5 MG tablet Take 5 mg by mouth daily.   Yes Historical Provider, MD  aspirin 81 MG chewable tablet Chew 1 tablet (81 mg total) by mouth daily. Patient taking differently: Chew 81 mg by mouth every other day.  05/16/16  Yes Donnamae Jude, MD  diphenhydrAMINE  (BENADRYL) 25 mg capsule Take 25 mg by mouth every 6 (six) hours as needed for allergies.   Yes Historical Provider, MD  ondansetron (ZOFRAN ODT) 4 MG disintegrating tablet Take 1 tablet (4 mg total) by mouth every 6 (six) hours as needed for nausea. 09/01/16  Yes Peggy Constant, MD  oxyCODONE-acetaminophen (PERCOCET) 7.5-325 MG tablet Take 1 tablet by mouth every 6 (six) hours as needed for severe pain (for pain).   Yes Historical Provider, MD  predniSONE (DELTASONE) 5 MG tablet Take 10 mg by mouth daily with breakfast.    Yes Historical Provider, MD  promethazine (PHENERGAN) 25 MG tablet Take 1 tablet (25 mg total) by mouth every 6 (six) hours as needed for nausea or vomiting. 08/15/16  Yes Seabron Spates, CNM  ACCU-CHEK FASTCLIX LANCETS MISC 1 Units by Percutaneous route 4 (four) times daily. Patient not taking: Reported on 01/30/2017 09/01/16   Mora Bellman, MD  albuterol (PROVENTIL HFA;VENTOLIN HFA) 108 (90 Base) MCG/ACT inhaler Inhale 2 puffs into the lungs every 4 (four) hours as needed for wheezing or shortness of breath. Patient not taking: Reported on 01/30/2017 09/09/16   Mora Bellman, MD  Blood Glucose Monitoring Suppl (ACCU-CHEK NANO SMARTVIEW) w/Device KIT 1 Device by Does not apply route as directed. Check fasting, and two hours after breakfast, lunch and dinner Patient not taking: Reported on 01/30/2017 05/16/16   Donnamae Jude, MD  cyclobenzaprine (FLEXERIL) 10 MG tablet Take 1 tablet (10 mg total) by mouth 2 (two) times daily as needed for muscle spasms. Patient not taking: Reported on 01/30/2017 12/05/16   Noland Fordyce, PA-C  glucose blood (ACCU-CHEK SMARTVIEW) test strip Check blood sugars 4x/daily Patient not taking: Reported on 01/30/2017 09/01/16   Mora Bellman, MD  glyBURIDE (DIABETA) 2.5 MG tablet Take 1 tablet (2.5 mg total) by mouth daily with breakfast. Patient not taking: Reported on 01/30/2017 07/27/16   Donnamae Jude, MD  predniSONE (DELTASONE) 20 MG tablet 3 tabs po  daily x 3 days, then 2 tabs x 3 days, then 1.5 tabs x 3 days, then 1 tab x 3 days, then 0.5 tabs x 3 days Patient not taking: Reported on 01/30/2017 12/05/16   Noland Fordyce, PA-C  Prenatal Vit-Fe Fumarate-FA (PRENATAL COMPLETE) 14-0.4 MG TABS Take 1 tablet by mouth daily. 01/30/17   Duffy Bruce, MD    Family History Family History  Problem Relation Age of Onset  . Diabetes Mother   . Diabetes Maternal Aunt   . Diabetes Maternal Grandmother     Social History Social History  Substance Use Topics  . Smoking status: Former Smoker    Packs/day: 0.10    Years: 5.00    Types: Cigarettes    Quit date: 11/20/2014  . Smokeless tobacco: Never Used  . Alcohol use No     Comment: 01/08/2015 "last drink was New Year's Eve"     Allergies   Hydrocodone and Zithromax [azithromycin]   Review of Systems Review of Systems  Constitutional: Positive for fatigue. Negative for chills and fever.  HENT: Negative for congestion and rhinorrhea.   Eyes: Negative for visual disturbance.  Respiratory: Negative for cough, shortness of  breath and wheezing.   Cardiovascular: Negative for chest pain and leg swelling.  Gastrointestinal: Positive for abdominal pain and nausea. Negative for diarrhea and vomiting.  Genitourinary: Positive for pelvic pain. Negative for dysuria and flank pain.  Musculoskeletal: Negative for neck pain and neck stiffness.  Skin: Negative for rash and wound.  Allergic/Immunologic: Negative for immunocompromised state.  Neurological: Negative for syncope, weakness and headaches.  All other systems reviewed and are negative.    Physical Exam Updated Vital Signs BP 122/80   Pulse 88   Temp 98.4 F (36.9 C) (Oral)   Resp 17   Ht 5' 3" (1.6 m)   Wt 172 lb (78 kg)   LMP 12/27/2016   SpO2 100%   BMI 30.47 kg/m   Physical Exam  Constitutional: She is oriented to person, place, and time. She appears well-developed and well-nourished. No distress.  HENT:  Head:  Normocephalic and atraumatic.  Eyes: Conjunctivae are normal.  Neck: Neck supple.  Cardiovascular: Normal rate, regular rhythm and normal heart sounds.  Exam reveals no friction rub.   No murmur heard. Pulmonary/Chest: Effort normal and breath sounds normal. No respiratory distress. She has no wheezes. She has no rales.  Abdominal: Soft. Bowel sounds are normal. She exhibits no distension. There is no tenderness. There is no rebound and no guarding.  Genitourinary:  Genitourinary Comments: Chaperoned exam. NEFG. Os closed. Minimal white vaginal discharge. No bleeding. No CMT. No adnexal pain or fullness.  Musculoskeletal: She exhibits no edema.  Neurological: She is alert and oriented to person, place, and time. She exhibits normal muscle tone.  Skin: Skin is warm. Capillary refill takes less than 2 seconds.  Psychiatric: She has a normal mood and affect.  Nursing note and vitals reviewed.    ED Treatments / Results  Labs (all labs ordered are listed, but only abnormal results are displayed) Labs Reviewed  WET PREP, GENITAL - Abnormal; Notable for the following:       Result Value   WBC, Wet Prep HPF POC MANY (*)    All other components within normal limits  COMPREHENSIVE METABOLIC PANEL - Abnormal; Notable for the following:    BUN 5 (*)    ALT 11 (*)    All other components within normal limits  URINALYSIS, ROUTINE W REFLEX MICROSCOPIC - Abnormal; Notable for the following:    APPearance HAZY (*)    All other components within normal limits  I-STAT BETA HCG BLOOD, ED ([facility], [facility], [facility] ONLY) - Abnormal; Notable for the following:    I-stat hCG, quantitative 718.9 (*)    All other components within normal limits  LIPASE, BLOOD  CBC  GC/CHLAMYDIA PROBE AMP ([place]) NOT AT [facility]    EKG  EKG Interpretation None       Radiology US Ob Comp Less 14 Wks  Result Date: 01/30/2017 CLINICAL DATA:  Pregnant patient with pelvic pain for multiple days. EXAM: OBSTETRIC <14 WK [place] AND  TRANSVAGINAL OB US TECHNIQUE: Both transabdominal and transvaginal ultrasound examinations were performed for complete evaluation of the gestation as well as the maternal uterus, adnexal regions, and pelvic cul-de-sac. Transvaginal technique was performed to assess early pregnancy. COMPARISON:  None. FINDINGS: Intrauterine gestational sac: Single Yolk sac:  Indeterminate Embryo:  Not Visualized. Cardiac Activity: Not Visualized. MSD: 3.4  mm   5 w   0  d Subchorionic hemorrhage:  None visualized. Maternal uterus/adnexae: Probable corpus luteum within the right ovary. Small cyst within the left ovary. Small amount of  free fluid in the pelvis. IMPRESSION: Probable early intrauterine gestational sac, but no definite yolk sac, fetal pole, or cardiac activity yet visualized. Recommend follow-up quantitative B-HCG levels and follow-up US in 14 days to assess viability. This recommendation follows SRU consensus guidelines: Diagnostic Criteria for Nonviable Pregnancy Early in the First Trimester. [place] Med 2013; 101:7510-25. Electronically Signed   By: Lovey Newcomer M.D.   On: 01/30/2017 16:43   US Ob Transvaginal  Result Date: 01/30/2017 CLINICAL DATA:  Pregnant patient with pelvic pain for multiple days. EXAM: OBSTETRIC <14 WK [place] AND TRANSVAGINAL OB US TECHNIQUE: Both transabdominal and transvaginal ultrasound examinations were performed for complete evaluation of the gestation as well as the maternal uterus, adnexal regions, and pelvic cul-de-sac. Transvaginal technique was performed to assess early pregnancy. COMPARISON:  None. FINDINGS: Intrauterine gestational sac: Single Yolk sac:  Indeterminate Embryo:  Not Visualized. Cardiac Activity: Not Visualized. MSD: 3.4  mm   5 w   0  d Subchorionic hemorrhage:  None visualized. Maternal uterus/adnexae: Probable corpus luteum within the right ovary. Small cyst within the left ovary. Small amount of free fluid in the pelvis. IMPRESSION: Probable early intrauterine  gestational sac, but no definite yolk sac, fetal pole, or cardiac activity yet visualized. Recommend follow-up quantitative B-HCG levels and follow-up US in 14 days to assess viability. This recommendation follows SRU consensus guidelines: Diagnostic Criteria for Nonviable Pregnancy Early in the First Trimester. [employer] Med 2013; 852:7782-42. Electronically Signed   By: Lovey Newcomer M.D.   On: 01/30/2017 16:43    Procedures Procedures (including critical care time)  Medications Ordered in ED Medications - No data to display   Initial Impression / Assessment and Plan / ED Course  I have reviewed the triage vital signs and the nursing notes.  Pertinent labs & imaging results that were available during my care of the patient were reviewed by me and considered in my medical decision making (see chart for details).     26 year old female with history of lupus on chronic steroids here with positive pregnancy test and lower abdominal pain. Suspect implantation-related pain, although differential includes threatened abortion, miscarriage, also possibly ectopic, although she has no adnexal tenderness. Os is closed with no bleeding. Ultrasound obtained and shows likely intrauterine, early pregnancy, although no yolk sac is identified. Labwork and wet prep is otherwise negative. She was recently treated for PID but she has no evidence of ongoing PID on my examination or evidence of tubo-ovarian abscess. Will start on prenatals and referred to women's clinic for high risk OB evaluation and 48 hour quant, repeat U/S as indicated. Reviewed home meds as well, advised pt to call her Rheumatologist tomorrow to discuss med changes in pregnancy.  Final Clinical Impressions(s) / ED Diagnoses   Final diagnoses:  Pregnancy of unknown anatomic location  First trimester pregnancy    New Prescriptions Discharge Medication List as of 01/30/2017  5:33 PM    START taking these medications   Details  Prenatal  Vit-Fe Fumarate-FA (PRENATAL COMPLETE) 14-0.4 MG TABS Take 1 tablet by mouth daily., Starting Mon 01/30/2017, Print         Duffy Bruce, MD 01/31/17 [phone]

## 2017-01-30 NOTE — ED Notes (Signed)
Pelvic cart at bedside. 

## 2017-01-30 NOTE — ED Triage Notes (Signed)
Pt states she took a pregnancy test that was positive and she wants to know how far along she is. LMP started on the 6th of Feb.  States she has lower abdominal pain and nausea, but denies diarrhea/vaginal discharge.

## 2017-01-31 LAB — GC/CHLAMYDIA PROBE AMP (~~LOC~~) NOT AT ARMC
CHLAMYDIA, DNA PROBE: NEGATIVE
NEISSERIA GONORRHEA: NEGATIVE

## 2017-02-01 ENCOUNTER — Encounter (HOSPITAL_COMMUNITY): Payer: Self-pay | Admitting: *Deleted

## 2017-02-01 ENCOUNTER — Inpatient Hospital Stay (HOSPITAL_COMMUNITY)
Admission: AD | Admit: 2017-02-01 | Discharge: 2017-02-01 | Disposition: A | Discharge status: Home / Self Care | Payer: Medicare Other | Source: Ambulatory Visit | Attending: Obstetrics and Gynecology | Admitting: Obstetrics and Gynecology

## 2017-02-01 DIAGNOSIS — Z3A01 Less than 8 weeks gestation of pregnancy: Secondary | ICD-10-CM | POA: Insufficient documentation

## 2017-02-01 DIAGNOSIS — Z87891 Personal history of nicotine dependence: Secondary | ICD-10-CM | POA: Diagnosis not present

## 2017-02-01 DIAGNOSIS — Z7982 Long term (current) use of aspirin: Secondary | ICD-10-CM | POA: Diagnosis not present

## 2017-02-01 DIAGNOSIS — O26891 Other specified pregnancy related conditions, first trimester: Secondary | ICD-10-CM | POA: Insufficient documentation

## 2017-02-01 DIAGNOSIS — R109 Unspecified abdominal pain: Secondary | ICD-10-CM | POA: Diagnosis present

## 2017-02-01 DIAGNOSIS — R197 Diarrhea, unspecified: Secondary | ICD-10-CM | POA: Insufficient documentation

## 2017-02-01 DIAGNOSIS — O3680X0 Pregnancy with inconclusive fetal viability, not applicable or unspecified: Secondary | ICD-10-CM

## 2017-02-01 LAB — CBC WITH DIFFERENTIAL/PLATELET
BASOS PCT: 0 %
Basophils Absolute: 0 10*3/uL (ref 0.0–0.1)
Eosinophils Absolute: 0 10*3/uL (ref 0.0–0.7)
Eosinophils Relative: 0 %
HEMATOCRIT: 39.8 % (ref 36.0–46.0)
HEMOGLOBIN: 13.3 g/dL (ref 12.0–15.0)
LYMPHS ABS: 0.8 10*3/uL (ref 0.7–4.0)
LYMPHS PCT: 17 %
MCH: 28.3 pg (ref 26.0–34.0)
MCHC: 33.4 g/dL (ref 30.0–36.0)
MCV: 84.7 fL (ref 78.0–100.0)
MONO ABS: 0.3 10*3/uL (ref 0.1–1.0)
MONOS PCT: 6 %
NEUTROS ABS: 3.7 10*3/uL (ref 1.7–7.7)
NEUTROS PCT: 77 %
PLATELETS: 255 10*3/uL (ref 150–400)
RBC: 4.7 MIL/uL (ref 3.87–5.11)
RDW: 12.9 % (ref 11.5–15.5)
WBC: 4.8 10*3/uL (ref 4.0–10.5)

## 2017-02-01 LAB — URINALYSIS, ROUTINE W REFLEX MICROSCOPIC
Bilirubin Urine: NEGATIVE
GLUCOSE, UA: NEGATIVE mg/dL
HGB URINE DIPSTICK: NEGATIVE
Ketones, ur: NEGATIVE mg/dL
NITRITE: NEGATIVE
PH: 7 (ref 5.0–8.0)
PROTEIN: NEGATIVE mg/dL
SPECIFIC GRAVITY, URINE: 1.009 (ref 1.005–1.030)

## 2017-02-01 LAB — HCG, QUANTITATIVE, PREGNANCY: hCG, Beta Chain, Quant, S: 2154 m[IU]/mL — ABNORMAL HIGH (ref <5)

## 2017-02-01 NOTE — MAU Note (Signed)
C/o stronger mid-abdominal cramping  today; c/o diarrhea that started @ 0300 this AM; c/o nausea that started today;

## 2017-02-01 NOTE — MAU Provider Note (Signed)
History     CSN: [id number]  Arrival date and time: 02/01/17 1559   First Provider Initiated Contact with Patient 02/01/17 1727      Chief Complaint  Patient presents with  . Abdominal Pain  . Diarrhea  . Headache   HPI   Kayla [name] is a 26 y.o. female with a poor obstetrical history,  G3P0110 @ [redacted]w[redacted]d here in [place] for a follow up beta hcg level. She was seen at [facility] 2 days ago with abdominal pain. The pain is no worse in severity.  US showed an IUP and a possible yolk sac. She continues to have abdominal pain. The pain is located in both sides of her lower abdomen. The pain comes and goes. She is also have frequent diarrhea. She has had 3-4 episodes of loose stool.   OB History    Gravida Para Term Preterm AB Living   3 1 0 1 1 0   SAB TAB Ectopic Multiple Live Births   1 0 0 0        Past Medical History:  Diagnosis Date  . Arthritis    "hands and legs" (01/08/2015)  . CAP (community acquired pneumonia) 01/07/2015  . Daily headache    "sometimes" (01/08/2015)  . GERD (gastroesophageal reflux disease)   . Hypertension   . Lung disease   . Lupus   . Pulmonary hypertension   . Sjogren's syndrome [facility])     Past Surgical History:  Procedure Laterality Date  . CARDIAC CATHETERIZATION N/A 09/09/2015   Procedure: Right Heart Cath;  Surgeon: Laurey Morale, MD;  Location: [facility] INVASIVE CV LAB;  Service: Cardiovascular;  Laterality: N/A;  . DILATION AND EVACUATION N/A 07/13/2015   Procedure: DILATATION AND EVACUATION;  Surgeon: Levie Heritage, DO;  Location: [facility] ORS;  Service: Gynecology;  Laterality: N/A;  . FINGER SURGERY Right 03/2014   "laceration, nerve/artery injury" 2nd digit  . VIDEO BRONCHOSCOPY Bilateral 01/12/2015   Procedure: VIDEO BRONCHOSCOPY WITH FLUORO;  Surgeon: Leslye Peer, MD;  Location: [facility] ENDOSCOPY;  Service: Cardiopulmonary;  Laterality: Bilateral;    Family History  Problem Relation Age of Onset  . Diabetes Mother   . Diabetes Maternal Aunt    . Diabetes Maternal Grandmother     Social History  Substance Use Topics  . Smoking status: Former Smoker    Packs/day: 0.10    Years: 5.00    Types: Cigarettes    Quit date: 11/20/2014  . Smokeless tobacco: Former Neurosurgeon  . Alcohol use No     Comment: 01/08/2015 "last drink was New Year's Eve"    Allergies:  Allergies  Allergen Reactions  . Hydrocodone Nausea And Vomiting  . Zithromax [Azithromycin] Itching and Cough    Prescriptions Prior to Admission  Medication Sig Dispense Refill Last Dose  . aspirin 81 MG chewable tablet Chew 1 tablet (81 mg total) by mouth daily. (Patient taking differently: Chew 81 mg by mouth daily as needed (chest pain). ) 30 tablet 5 months at Unknown time  . diphenhydrAMINE (BENADRYL) 25 mg capsule Take 25 mg by mouth every 6 (six) hours as needed for allergies.   Past Week at Unknown time  . folic acid (FOLVITE) 1 MG tablet Take 1 mg by mouth daily.   02/01/2017 at Unknown time  . oxyCODONE-acetaminophen (PERCOCET) 7.5-325 MG tablet Take 1 tablet by mouth every 6 (six) hours as needed for severe pain (for pain).   2 Month at Unknown time  . predniSONE (DELTASONE) 5 MG  tablet Take 10 mg by mouth daily.   02/01/2017 at Unknown time  . Prenatal Vit-Fe Fumarate-FA (PRENATAL COMPLETE) 14-0.4 MG TABS Take 1 tablet by mouth daily. 30 each 0 02/01/2017 at Unknown time  . albuterol (PROVENTIL HFA;VENTOLIN HFA) 108 (90 Base) MCG/ACT inhaler Inhale 2 puffs into the lungs every 4 (four) hours as needed for wheezing or shortness of breath. (Patient not taking: Reported on 01/30/2017) 1 Inhaler 5 Not Taking at Unknown time  . cyclobenzaprine (FLEXERIL) 10 MG tablet Take 1 tablet (10 mg total) by mouth 2 (two) times daily as needed for muscle spasms. (Patient not taking: Reported on 01/30/2017) 20 tablet 0 Not Taking at Unknown time  . glyBURIDE (DIABETA) 2.5 MG tablet Take 1 tablet (2.5 mg total) by mouth daily with breakfast. (Patient not taking: Reported on 01/30/2017) 60  tablet 3 Not Taking at Unknown time   Results for orders placed or performed during the hospital encounter of 02/01/17 (from the past 48 hour(s))  Urinalysis, Routine w reflex microscopic     Status: Abnormal   Collection Time: 02/01/17  4:12 PM  Result Value Ref Range   Color, Urine STRAW (A) YELLOW   APPearance CLEAR CLEAR   Specific Gravity, Urine 1.009 1.005 - 1.030   pH 7.0 5.0 - 8.0   Glucose, UA NEGATIVE NEGATIVE mg/dL   Hgb urine dipstick NEGATIVE NEGATIVE   Bilirubin Urine NEGATIVE NEGATIVE   Ketones, ur NEGATIVE NEGATIVE mg/dL   Protein, ur NEGATIVE NEGATIVE mg/dL   Nitrite NEGATIVE NEGATIVE   Leukocytes, UA MODERATE (A) NEGATIVE   RBC / HPF 0-5 0 - 5 RBC/hpf   WBC, UA 0-5 0 - 5 WBC/hpf   Bacteria, UA RARE (A) NONE SEEN   Squamous Epithelial / LPF 6-30 (A) NONE SEEN   Mucous PRESENT     Review of Systems  Constitutional: Negative for fever.  Gastrointestinal: Positive for abdominal pain, diarrhea and nausea. Negative for vomiting.  Genitourinary: Positive for urgency. Negative for dysuria.   Physical Exam   Blood pressure 127/85, pulse 112, temperature 99.2 F (37.3 C), temperature source Oral, resp. rate 18, last menstrual period 12/27/2016.  Physical Exam  Constitutional: She is oriented to person, place, and time. She appears well-developed and well-nourished. No distress.  HENT:  Head: Normocephalic.  Eyes: Pupils are equal, round, and reactive to light.  GI: Soft. She exhibits no distension. There is no tenderness. There is no rebound and no guarding.  Musculoskeletal: Normal range of motion.  Neurological: She is alert and oriented to person, place, and time.  Skin: Skin is warm. She is not diaphoretic.  Psychiatric: Her behavior is normal.   MAU Course  Procedures  None  MDM Urine culture pending.   Assessment and Plan   A:  1. Pregnancy of unknown anatomic location   2. Abdominal pain in pregnancy, first trimester   3. Diarrhea, unspecified  type     P:  Discharge home in stable condition Follow up US in 7 days Ectopic precautions  Return to MAU if symptoms worsen  Prenatal vitamins daily List of over the counter meds to aid In diarrhea symptoms Increase PO fluid intake.    Duane LopeJennifer I Sahmir Weatherbee, NP 02/01/2017 7:33 PM

## 2017-02-01 NOTE — MAU Note (Signed)
Pt C/O mid & lower abd cramping for the last 3-4 days, started having diarrhea last night - 5 episodes.  HA started today, is worse than usual.  Denies vomiting or fever.

## 2017-02-01 NOTE — Discharge Instructions (Signed)
Abdominal Pain During Pregnancy °Belly (abdominal) pain is common during pregnancy. Most of the time, it is not a serious problem. Other times, it can be a sign that something is wrong with the pregnancy. Always tell your doctor if you have belly pain. °Follow these instructions at home: °Monitor your belly pain for any changes. The following actions may help you feel better: °· Do not have sex (intercourse) or put anything in your vagina until you feel better. °· Rest until your pain stops. °· Drink clear fluids if you feel sick to your stomach (nauseous). Do not eat solid food until you feel better. °· Only take medicine as told by your doctor. °· Keep all doctor visits as told. °Get help right away if: °· You are bleeding, leaking fluid, or pieces of tissue come out of your vagina. °· You have more pain or cramping. °· You keep throwing up (vomiting). °· You have pain when you pee (urinate) or have blood in your pee. °· You have a fever. °· You do not feel your baby moving as much. °· You feel very weak or feel like passing out. °· You have trouble breathing, with or without belly pain. °· You have a very bad headache and belly pain. °· You have fluid leaking from your vagina and belly pain. °· You keep having watery poop (diarrhea). °· Your belly pain does not go away after resting, or the pain gets worse. °This information is not intended to replace advice given to you by your health care provider. Make sure you discuss any questions you have with your health care provider. °Document Released: 10/26/2009 Document Revised: 06/15/2016 Document Reviewed: 06/06/2013 °Elsevier Interactive Patient Education © 2017 Elsevier Inc. ° °

## 2017-02-03 LAB — CULTURE, OB URINE
Culture: >=100000 — AB
Special Requests: NORMAL

## 2017-02-08 ENCOUNTER — Ambulatory Visit (HOSPITAL_COMMUNITY)
Admission: RE | Admit: 2017-02-08 | Discharge: 2017-02-08 | Disposition: A | Discharge status: Home / Self Care | Payer: Medicare Other | Source: Ambulatory Visit | Attending: Obstetrics and Gynecology | Admitting: Obstetrics and Gynecology

## 2017-02-08 ENCOUNTER — Ambulatory Visit: Payer: Medicare Other | Admitting: *Deleted

## 2017-02-08 DIAGNOSIS — O26891 Other specified pregnancy related conditions, first trimester: Secondary | ICD-10-CM | POA: Diagnosis not present

## 2017-02-08 DIAGNOSIS — R109 Unspecified abdominal pain: Secondary | ICD-10-CM

## 2017-02-08 DIAGNOSIS — O3481 Maternal care for other abnormalities of pelvic organs, first trimester: Secondary | ICD-10-CM | POA: Insufficient documentation

## 2017-02-08 DIAGNOSIS — O3680X0 Pregnancy with inconclusive fetal viability, not applicable or unspecified: Secondary | ICD-10-CM

## 2017-02-08 DIAGNOSIS — N8312 Corpus luteum cyst of left ovary: Secondary | ICD-10-CM | POA: Insufficient documentation

## 2017-02-08 DIAGNOSIS — R1030 Lower abdominal pain, unspecified: Secondary | ICD-10-CM | POA: Insufficient documentation

## 2017-02-08 DIAGNOSIS — Z3A01 Less than 8 weeks gestation of pregnancy: Secondary | ICD-10-CM | POA: Insufficient documentation

## 2017-02-08 DIAGNOSIS — Z3490 Encounter for supervision of normal pregnancy, unspecified, unspecified trimester: Secondary | ICD-10-CM

## 2017-02-08 NOTE — Progress Notes (Signed)
Here for results of viability ultrasound which showed normal pregnancy and were reviewed with Kayla [name]. [name], NP also spoke with her briefly. Reviewed meds with her and her new ob appt for 02/13/17 with our office.

## 2017-02-13 ENCOUNTER — Ambulatory Visit (INDEPENDENT_AMBULATORY_CARE_PROVIDER_SITE_OTHER): Payer: Medicare Other | Admitting: Obstetrics & Gynecology

## 2017-02-13 ENCOUNTER — Encounter: Payer: Self-pay | Admitting: Obstetrics & Gynecology

## 2017-02-13 VITALS — Wt 170.2 lb

## 2017-02-13 DIAGNOSIS — M3502 Sicca syndrome with lung involvement: Secondary | ICD-10-CM

## 2017-02-13 DIAGNOSIS — O9989 Other specified diseases and conditions complicating pregnancy, childbirth and the puerperium: Secondary | ICD-10-CM

## 2017-02-13 DIAGNOSIS — O99511 Diseases of the respiratory system complicating pregnancy, first trimester: Secondary | ICD-10-CM

## 2017-02-13 DIAGNOSIS — M329 Systemic lupus erythematosus, unspecified: Secondary | ICD-10-CM

## 2017-02-13 DIAGNOSIS — J849 Interstitial pulmonary disease, unspecified: Secondary | ICD-10-CM

## 2017-02-13 DIAGNOSIS — O10911 Unspecified pre-existing hypertension complicating pregnancy, first trimester: Secondary | ICD-10-CM | POA: Diagnosis not present

## 2017-02-13 DIAGNOSIS — O0990 Supervision of high risk pregnancy, unspecified, unspecified trimester: Secondary | ICD-10-CM

## 2017-02-13 DIAGNOSIS — O10919 Unspecified pre-existing hypertension complicating pregnancy, unspecified trimester: Secondary | ICD-10-CM

## 2017-02-13 DIAGNOSIS — O99111 Other diseases of the blood and blood-forming organs and certain disorders involving the immune mechanism complicating pregnancy, first trimester: Secondary | ICD-10-CM

## 2017-02-13 DIAGNOSIS — O99519 Diseases of the respiratory system complicating pregnancy, unspecified trimester: Secondary | ICD-10-CM

## 2017-02-13 DIAGNOSIS — Z8759 Personal history of other complications of pregnancy, childbirth and the puerperium: Secondary | ICD-10-CM

## 2017-02-13 DIAGNOSIS — R012 Other cardiac sounds: Secondary | ICD-10-CM

## 2017-02-13 DIAGNOSIS — Z1331 Encounter for screening for depression: Secondary | ICD-10-CM

## 2017-02-13 NOTE — Progress Notes (Signed)
New ob packet given  First Trimester Screen scheduled for May 7th @ 0830.   MFM consult scheduled for March 27th @ 1400.  Pt notified.  Home Medicaid Form Completed    PRENATAL VISIT NOTE  Subjective:  Kayla [name] is a 26 y.o. G3P0110 at 44w0dbeing seen today for first prenatal care visit.  She is currently monitored for the following issues for this high-risk pregnancy and has Protein-calorie malnutrition, severe ([place]; Malar rash; Alopecia; Arthralgia; Interstitial lung disease ([place]; Supervision of high risk pregnancy, antepartum; Systemic lupus erythematosus (SLE) affecting pregnancy, antepartum ([place]; Lupus (systemic lupus erythematosus) ([place]; Loud P2 (pulmonary S2, second heart sound); Insomnia; Other secondary pulmonary hypertension; Chronic hypertension; Chronic hypertension during pregnancy, antepartum; Sjogren's syndrome ([place]; Chronic Respiratory failure with oxygen requirement affecting pregnancy, antepartum; SS-A antibody positive; SS-B antibody positive; and History of fetal loss on her problem list.  Patient reports no complaints.  Contractions: Not present. Vag. Bleeding: None.   . Denies leaking of fluid.   The following portions of the patient's history were reviewed and updated as appropriate: allergies, current medications, past family history, past medical history, past social history, past surgical history and problem list. Problem list updated.  Objective:   Vitals:   02/13/17 0947  Weight: 170 lb 3.2 oz (77.2 kg)    Fetal Status:           General:  Alert, oriented and cooperative. Patient is in no acute distress.  Skin: Skin is warm and dry. No rash noted.   Cardiovascular: Normal heart rate noted  Respiratory: Normal respiratory effort, no problems with respiration noted  Abdomen: Soft, gravid, appropriate for gestational age. Pain/Pressure: Absent     Pelvic:  Cervical exam performed        Extremities: Normal range of motion.  Edema: None  Mental Status:  Normal mood and affect. Normal behavior. Normal judgment and thought content.   Assessment and Plan:  Pregnancy: G3P0110 at 740w0d1. Supervision of high risk pregnancy, antepartum - GC/Chlamydia probe amp ([place])not at [facility] - [place]FM Fetal Nuchal Translucency; Future - Hemoglobinopathy evaluation - AMB referral to maternal fetal medicine--co-management during pregnancy - Comp Met (CMET) - TSH - Creatinine clearance, urine, 24 hour - Protein, urine, 24 hour - Hemoglobin A1c - Culture, OB Urine - Glucose Tolerance, 1 Hour   2. Positive depression screening -Anxiety about IUFD - Amb ref [place]. Systemic lupus erythematosus (SLE) affecting pregnancy, antepartum (HCC) -On prednisone - + SSA and + SSB--at risk for heart block.  - Follow up with [facility] pediatric cardiology               Infants born to mother's with SSA or SSB antibodies are at increased risk of developing congenital heart block due to the antibodies crossing the placenta and damaging the conduction system of the fetal heart. The heart block can be of varying degrees and can occur abruptly. For mothers who are considered to be low risk the incidence of fetal congenital complete heart block (CCHB) is approximately 2-6%. Having a previous child with heart block or other autoimmune disorders such as antibody mediated maternal thyroid dysfunction are considered to be markers for increased risk of CCHB. For those considered to be high risk the incidence of CCHB is approximately 20-50%. The development of CCHB is associated with high risk of morbidity and mortality for the fetus. There is some evidence that first and second degree heart block may be reversible with medical  therapy. Therefore it is standard practice at our institution that all mothers with SSA or SSB antibodies undergo routine screening for heart block from 18 through 28-[redacted] weeks gestation, which corresponds to the period of greatest  risk. We recommend that mothers, such as Ms. Stringfield, who are low risk have repeat fetal echocardiograms every other week to reassess for signs of developing heart block with fetal heart rate checks at the obstetrician's office on the weeks between to assess for bradycardia.  4. Loud P2 (pulmonary S2, second heart sound) -Pt encouraged to f/u with pulmonology or cardiology.  Was present in 2016 per pulmonology  note  5. Interstitial lung disease ([place]) -non longer requiring O2--followed by [place] pulmonology - Serial PFT's per her pulmonary provider  6. Chronic hypertension during pregnancy, antepartum -not on meds currently; need baseline labs, 24 hour urine, TSH, Aspirin at 13 weeks. -Echo nml in 2017 - Serial monthly ultrasounds for evaluation of fetal growth starting ~[redacted] weeks gestation.  7. History of fetal loss -Need autopsy report; rest of work up seems to be negative (at [facility] explanation in problem list)  8. Sjogren's syndrome with lung involvement ([place])  9.  History of GDM -On prednisone--will do early GCT.  Preterm labor symptoms and general obstetric precautions including but not limited to vaginal bleeding, contractions, leaking of fluid and fetal movement were reviewed in detail with the patient. Please refer to After Visit Summary for other counseling recommendations.  No Follow-up on file.   Guss Bunde, MD

## 2017-02-14 ENCOUNTER — Encounter: Payer: Self-pay | Admitting: Obstetrics & Gynecology

## 2017-02-14 ENCOUNTER — Encounter (HOSPITAL_COMMUNITY): Payer: Self-pay

## 2017-02-14 ENCOUNTER — Ambulatory Visit (HOSPITAL_COMMUNITY)
Admission: RE | Admit: 2017-02-14 | Discharge: 2017-02-14 | Disposition: A | Discharge status: Home / Self Care | Payer: Medicare Other | Source: Ambulatory Visit | Attending: Obstetrics & Gynecology | Admitting: Obstetrics & Gynecology

## 2017-02-14 VITALS — BP 112/74 | HR 83 | Wt 171.4 lb

## 2017-02-14 DIAGNOSIS — Z8759 Personal history of other complications of pregnancy, childbirth and the puerperium: Secondary | ICD-10-CM | POA: Insufficient documentation

## 2017-02-14 DIAGNOSIS — M329 Systemic lupus erythematosus, unspecified: Secondary | ICD-10-CM | POA: Diagnosis present

## 2017-02-14 DIAGNOSIS — Z79899 Other long term (current) drug therapy: Secondary | ICD-10-CM | POA: Insufficient documentation

## 2017-02-14 DIAGNOSIS — O99611 Diseases of the digestive system complicating pregnancy, first trimester: Secondary | ICD-10-CM | POA: Diagnosis not present

## 2017-02-14 DIAGNOSIS — R768 Other specified abnormal immunological findings in serum: Secondary | ICD-10-CM

## 2017-02-14 DIAGNOSIS — Z7952 Long term (current) use of systemic steroids: Secondary | ICD-10-CM | POA: Insufficient documentation

## 2017-02-14 DIAGNOSIS — R760 Raised antibody titer: Secondary | ICD-10-CM | POA: Insufficient documentation

## 2017-02-14 DIAGNOSIS — Z885 Allergy status to narcotic agent status: Secondary | ICD-10-CM | POA: Diagnosis not present

## 2017-02-14 DIAGNOSIS — Z7982 Long term (current) use of aspirin: Secondary | ICD-10-CM | POA: Insufficient documentation

## 2017-02-14 DIAGNOSIS — O10911 Unspecified pre-existing hypertension complicating pregnancy, first trimester: Secondary | ICD-10-CM | POA: Diagnosis not present

## 2017-02-14 DIAGNOSIS — K219 Gastro-esophageal reflux disease without esophagitis: Secondary | ICD-10-CM | POA: Insufficient documentation

## 2017-02-14 DIAGNOSIS — O9989 Other specified diseases and conditions complicating pregnancy, childbirth and the puerperium: Secondary | ICD-10-CM | POA: Insufficient documentation

## 2017-02-14 DIAGNOSIS — O99891 Other specified diseases and conditions complicating pregnancy: Secondary | ICD-10-CM

## 2017-02-14 DIAGNOSIS — O10919 Unspecified pre-existing hypertension complicating pregnancy, unspecified trimester: Secondary | ICD-10-CM

## 2017-02-14 DIAGNOSIS — O0990 Supervision of high risk pregnancy, unspecified, unspecified trimester: Secondary | ICD-10-CM

## 2017-02-14 DIAGNOSIS — M3502 Sicca syndrome with lung involvement: Secondary | ICD-10-CM | POA: Diagnosis not present

## 2017-02-14 DIAGNOSIS — Z3A01 Less than 8 weeks gestation of pregnancy: Secondary | ICD-10-CM | POA: Diagnosis not present

## 2017-02-14 DIAGNOSIS — Z881 Allergy status to other antibiotic agents status: Secondary | ICD-10-CM | POA: Insufficient documentation

## 2017-02-14 LAB — GLUCOSE TOLERANCE, 1 HOUR

## 2017-02-14 LAB — COMPREHENSIVE METABOLIC PANEL
A/G RATIO: 1.5 (ref 1.2–2.2)
ALBUMIN: 4 g/dL (ref 3.5–5.5)
ALK PHOS: 61 IU/L (ref 39–117)
ALT: 14 IU/L (ref 0–32)
AST: 18 IU/L (ref 0–40)
BILIRUBIN TOTAL: 0.3 mg/dL (ref 0.0–1.2)
BUN / CREAT RATIO: 10 (ref 9–23)
BUN: 6 mg/dL (ref 6–20)
CHLORIDE: 101 mmol/L (ref 96–106)
CO2: 22 mmol/L (ref 18–29)
Calcium: 8.6 mg/dL — ABNORMAL LOW (ref 8.7–10.2)
Creatinine, Ser: 0.63 mg/dL (ref 0.57–1.00)
GFR calc non Af Amer: 123 mL/min/{1.73_m2} (ref >59)
GFR, EST AFRICAN AMERICAN: 142 mL/min/{1.73_m2} (ref >59)
GLUCOSE: 102 mg/dL — AB (ref 65–99)
Globulin, Total: 2.7 g/dL (ref 1.5–4.5)
POTASSIUM: 3.3 mmol/L — AB (ref 3.5–5.2)
SODIUM: 141 mmol/L (ref 134–144)
TOTAL PROTEIN: 6.7 g/dL (ref 6.0–8.5)

## 2017-02-14 LAB — GC/CHLAMYDIA PROBE AMP (~~LOC~~) NOT AT ARMC
Chlamydia: NEGATIVE
NEISSERIA GONORRHEA: POSITIVE — AB

## 2017-02-14 LAB — TSH: TSH: 0.345 u[IU]/mL — AB (ref 0.450–4.500)

## 2017-02-14 LAB — HEMOGLOBINOPATHY EVALUATION
HEMOGLOBIN A2 QUANTITATION: 2.9 % (ref 1.8–3.2)
HEMOGLOBIN F QUANTITATION: 0 % (ref 0.0–2.0)
HGB C: 0 %
HGB S: 0 %
HGB VARIANT: 0 %
Hgb A: 97.1 % (ref 96.4–98.8)

## 2017-02-14 LAB — HEMOGLOBIN A1C
Est. average glucose Bld gHb Est-mCnc: 105 mg/dL
Hgb A1c MFr Bld: 5.3 % (ref 4.8–5.6)

## 2017-02-14 NOTE — Progress Notes (Signed)
Maternal Fetal Medicine Consultation  Requesting Provider(s): [place]  [facility] Reason for consultation: 1. SLE without cardiac or renal involvement 2. Sjogren's syndrome with interstitial pulmonary disease 3. Possible pulmonary hypertension 4. Chronic hypertension 5. 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 35 week IUFD in last pregnancy  HPI: Ms. Kayla [name] is a 26yo P0110 at 7+0 weeks, referred for an extremely complicated medical history  OB History: OB History    Gravida Para Term Preterm AB Living   3 1 0 1 1 0   SAB TAB Ectopic Multiple Live Births   1 0 0 0 0      PMH:  Past Medical History:  Diagnosis Date  . Arthritis    "hands and legs" (01/08/2015)  . CAP (community acquired pneumonia) 01/07/2015  . Daily headache    "sometimes" (01/08/2015)  . GERD (gastroesophageal reflux disease)   . Hypertension   . Lung disease   . Lupus   . Pulmonary hypertension   . Sjogren's syndrome (HCC)     PSH:  Past Surgical History:  Procedure Laterality Date  . CARDIAC CATHETERIZATION N/A 09/09/2015   Procedure: Right Heart Cath;  Surgeon: Laurey Moralealton S McLean, MD;  [facility];  Service: Cardiovascular;  Laterality: N/A;  . DILATION AND EVACUATION N/A 07/13/2015   Procedure: DILATATION AND EVACUATION;  Surgeon: Levie HeritageJacob J Stinson, DO;  Location: [facility] ORS;  Service: Gynecology;  Laterality: N/A;  . FINGER SURGERY Right 03/2014   "laceration, nerve/artery injury" 2nd digit  . VIDEO BRONCHOSCOPY Bilateral 01/12/2015   Procedure: VIDEO BRONCHOSCOPY WITH FLUORO;  Surgeon: Leslye Peerobert S Byrum, MD;  [facility];  Service: Cardiopulmonary;  Laterality: Bilateral;   Meds: Prednisone 10mg  PO qd Plaquenil 200mg  PO qd Aspirin 81mg  PO qd PNV 1 PO qd Folic acid 1mg  PO qd Tacrolimus 0.1% ointment PRN  Allergies: Hydrocodone, Azithromycin FH: See EPIC section Soc: See EPIC section  Review of Systems: no vaginal bleeding or cramping/contractions, no LOF, no  nausea/vomiting. All other systems reviewed and are negative.  PE: See EPIC section  A/P: 1. Possible pulmonary hypertension. This is by far the most important item that must be dealt with immediately. Women with significant pulmonary hypertension run a risk of mortality as high as 50% during pregnancy. Somewhat reassuringly, the patient has [facility] right heart [facility] which showed no pulmonary hypertension. Her restrictive lung disease which as been oxygen dependent in the past (see below) certainly puts her at risk for its development. I have requested an appointment ASAP with Dr. Alford HighlandMcLean's office so as to determine the status of the pulmonary pressures. I discussed the potentially fatal outcomes involved with continuing a pregnancy in the face of significant pulmonary hypertension. We will see her again with a week of her cardiology appointment 2. SLE. This has been fairly stable recently and [facility] ([address] [place]) has recently decreased her daily prednisone to 10mg  from 20. Historically she has no nephritic involvement. Her most recent evaluation for lupus anticoagulant and anticardiolipin antibodies was from 11/17 and was negative. I would recommend a baseline 24hr urine for protein and creatinine clearance as well as baseline complement (CH50 and/or C3 and C4. She should have an antomic survey US and evaluations for growth every 4 weeks, and I would initiate BPPs from 30 weeks, and possible as early as 28 3. Fetus at risk for complete heart block. Because she is RO and LA antibody positive, she  has a baby at risk for complete heart block. She is amenable to continuing at [facility], so I would recommend consultation with them soon. Ususally a full fetal echo is done at 18 weeks, then weekly evaluations through 30-32 weeks. I would defer to the pediatric cardiologists at [facility] as to how they wished to do this. It appears that in her last pregnancy  she was seen at [facility] every 2 weeks. 4. Sjogren's with interstitial lung disease. She has just gotten off supplemental oxygen (5L [place]) 1 month ago. She is currently stable at rest. She continues to see her pulmonologist at [facility] Dustin Flock) and has appointment to see him again in May. 5. Chronic hypertension. She was previously on amlodipine but has been off that with good pressures for the last 3 months. She is already on low-dose aspirin for preeclampsia prevention. She needs baseline preeclamptic labs (the 24hr urine I mentioned above plus LFTs). The maternal and fetal surveillance has already been described above. 6. IUFD in previous pregnancy. This baby was delivered last November at [facility]. An extensive stillbirth workup was performed. Placental pathology showed chorioamnionitis. Karyotype and SNP microarray were normal. No fetomaternal hemorrhage was noted. Titers for common viral associations were normal. Our recommendations would be no more that what has been previously recommend for maternal and fetal surveillance above  She has an appointment with her cardiologist in 2 days. We will see her late next week once we can determine her risk for pulmonary hypertension   Thank you for the opportunity to be a part of the care ofLashonna [name]. Please contact our office if we can be of further assistance.   I spent approximately 45 minutes with this patient with over 50% of time spent in face-to-face counseling.

## 2017-02-15 ENCOUNTER — Ambulatory Visit: Payer: Self-pay

## 2017-02-15 ENCOUNTER — Ambulatory Visit (INDEPENDENT_AMBULATORY_CARE_PROVIDER_SITE_OTHER): Payer: Medicare Other | Admitting: *Deleted

## 2017-02-15 ENCOUNTER — Telehealth: Payer: Self-pay

## 2017-02-15 DIAGNOSIS — O98211 Gonorrhea complicating pregnancy, first trimester: Secondary | ICD-10-CM

## 2017-02-15 LAB — GLUCOSE TOLERANCE, 1 HOUR: GLUCOSE, 1HR PP: 98 mg/dL (ref 65–199)

## 2017-02-15 LAB — SPECIMEN STATUS REPORT

## 2017-02-15 MED ORDER — CEFTRIAXONE SODIUM 250 MG IJ SOLR
250.0000 mg | Freq: Once | INTRAMUSCULAR | Status: AC
  Administered 2017-02-15: 250 mg via INTRAMUSCULAR

## 2017-02-15 NOTE — Progress Notes (Signed)
Discussed diagnosis of +GC w/pt and need for partner to receive treatment also.  Pt states she was treated several weeks ago for same infection. Her partner told her he was treated and she has not had sex since her treatment. I told pt that I could not explain why her test is positive again. We will check test of cure @ next visit on 4/23. Per consult with Dr. Shawnie PonsPratt, Tx with Azithromycin was not given as per protocol due to pt's allergy and also she tested negative for Chlamydia on 3/26. Pt voiced understanding of information given.

## 2017-02-15 NOTE — Telephone Encounter (Signed)
Spoke to patient informed patient of positive Neisseria results. Patient has been advised to follow with our clinic to be treated and to make sure she tells her partners to get treated. Patient voice understanding at this time.

## 2017-02-16 ENCOUNTER — Other Ambulatory Visit: Payer: Medicare Other

## 2017-02-16 ENCOUNTER — Ambulatory Visit (HOSPITAL_COMMUNITY)
Admission: RE | Admit: 2017-02-16 | Discharge: 2017-02-16 | Disposition: A | Discharge status: Home / Self Care | Payer: Medicare Other | Source: Ambulatory Visit | Attending: Cardiology | Admitting: Cardiology

## 2017-02-16 VITALS — BP 120/74 | HR 78 | Wt 171.0 lb

## 2017-02-16 DIAGNOSIS — M3500 Sicca syndrome, unspecified: Secondary | ICD-10-CM | POA: Insufficient documentation

## 2017-02-16 DIAGNOSIS — Z3A01 Less than 8 weeks gestation of pregnancy: Secondary | ICD-10-CM | POA: Diagnosis not present

## 2017-02-16 DIAGNOSIS — K219 Gastro-esophageal reflux disease without esophagitis: Secondary | ICD-10-CM | POA: Insufficient documentation

## 2017-02-16 DIAGNOSIS — I272 Pulmonary hypertension, unspecified: Secondary | ICD-10-CM | POA: Diagnosis present

## 2017-02-16 DIAGNOSIS — Z79899 Other long term (current) drug therapy: Secondary | ICD-10-CM | POA: Insufficient documentation

## 2017-02-16 DIAGNOSIS — O99611 Diseases of the digestive system complicating pregnancy, first trimester: Secondary | ICD-10-CM | POA: Diagnosis not present

## 2017-02-16 DIAGNOSIS — O99511 Diseases of the respiratory system complicating pregnancy, first trimester: Secondary | ICD-10-CM | POA: Diagnosis not present

## 2017-02-16 DIAGNOSIS — J849 Interstitial pulmonary disease, unspecified: Secondary | ICD-10-CM | POA: Diagnosis not present

## 2017-02-16 DIAGNOSIS — Z7982 Long term (current) use of aspirin: Secondary | ICD-10-CM | POA: Insufficient documentation

## 2017-02-16 DIAGNOSIS — I2729 Other secondary pulmonary hypertension: Secondary | ICD-10-CM

## 2017-02-16 DIAGNOSIS — O99411 Diseases of the circulatory system complicating pregnancy, first trimester: Secondary | ICD-10-CM | POA: Diagnosis not present

## 2017-02-16 DIAGNOSIS — O9989 Other specified diseases and conditions complicating pregnancy, childbirth and the puerperium: Secondary | ICD-10-CM | POA: Insufficient documentation

## 2017-02-16 DIAGNOSIS — Z8249 Family history of ischemic heart disease and other diseases of the circulatory system: Secondary | ICD-10-CM | POA: Diagnosis not present

## 2017-02-16 DIAGNOSIS — M329 Systemic lupus erythematosus, unspecified: Secondary | ICD-10-CM | POA: Insufficient documentation

## 2017-02-16 NOTE — Patient Instructions (Signed)
Echocardiogram has been ordered for you.    Follow up as needed unless Echo Results are abnormal.

## 2017-02-16 NOTE — Progress Notes (Signed)
[name], female   DOB: [birth year], 26 y.o.   MRN: [id number] Pulmonology: Dr. Marchelle Gearing Cardiology: Dr. Shirlee Latch  26 yo with history of SLE, Sjogren's syndrome, and associated interstitial lung disease presents for evaluation for pulmonary hypertension.  She is followed by Dr Marchelle Gearing and a pulmonologist and rheumatologist at [facility].  I did a right heart cath on her in 10/16.  She did not have pulmoary hypertension.  She is no longer on home oxygen.  She is not short of breath walking on flat ground.  She is short of breath walking up steps.  Overall, her exercise improvement seems somewhat improved over the last few months.  No chest pain, no lightheadedness, no syncope.  No palpitations.    She last a prior pregnancy.  She is now [redacted] weeks pregnant again.    ECG (personally reviewed): NSR, nonspecific T wave flattening  Labs (9/16): K 3.3, creatinine 0.7  PMH: 1. Interstitial lung disease: Related to connective tissue disease.  No longer on home oxygen. 2. H/o pneumomediastinum in the setting of persistent cough.  3. SLE: Has ILD, malar rash, arthalgias.   4. GERD 5. Echo (3/16) with EF 55-60%, no pulmonary hypertension noted.  RHC (10/16): mean RA 2, PA 26/7, mean PCWP 5.  6. Sjogren's syndrome: +SSA and SSB.   SH: Nonsmoker, no ETOH, lives in [place].  FH: Grandfather with CHF.  No SLE.    ROS: All systems reviewed and negative except as per HPI.   Current Outpatient Prescriptions  Medication Sig Dispense Refill  . aspirin 81 MG chewable tablet Chew 1 tablet (81 mg total) by mouth daily. (Patient taking differently: Chew 81 mg by mouth daily as needed (chest pain). ) 30 tablet 5  . diphenhydrAMINE (BENADRYL) 25 mg capsule Take 25 mg by mouth every 6 (six) hours as needed for allergies.    . folic acid (FOLVITE) 1 MG tablet Take 1 mg by mouth daily.    . hydroxychloroquine (PLAQUENIL) 200 MG tablet Take 200 mg by mouth daily.    . predniSONE (DELTASONE) 5 MG tablet  Take 10 mg by mouth daily.    . Prenatal Vit-Fe Fumarate-FA (PRENATAL COMPLETE) 14-0.4 MG TABS Take 1 tablet by mouth daily. 30 each 0  . tacrolimus (PROTOPIC) 0.1 % ointment APP AA ON THE SKIN D PRN UTD  5  . albuterol (PROVENTIL HFA;VENTOLIN HFA) 108 (90 Base) MCG/ACT inhaler Inhale 2 puffs into the lungs every 4 (four) hours as needed for wheezing or shortness of breath. (Patient not taking: Reported on 01/30/2017) 1 Inhaler 5   No current facility-administered medications for this encounter.    BP 120/74 (BP Location: Left Arm, Patient Position: Sitting, Cuff Size: Normal)   Pulse 78   Wt 171 lb (77.6 kg)   LMP 12/27/2016   SpO2 95%   BMI 30.29 kg/m  General: NAD Neck: JVP not elevated, no thyromegaly or thyroid nodule.  Lungs: mild dry crackles at the bases.  CV: Nondisplaced PMI.  Heart regular S1/S2 with slightly prominent P2, no S3/S4, no murmur.  No peripheral edema.  No carotid bruit.  Normal pedal pulses.  Abdomen: Soft, nontender, no hepatosplenomegaly, no distention.  Skin: Normal.   Neurologic: Alert and oriented x 3.  Psych: Normal affect. Extremities: No clubbing or cyanosis.  HEENT: Normal.   Assessment/Plan: 1. Pulmonary hypertension:  Patient has a predisposing condition to group 1 PH, SLE.  Echo in 3/16 showed normal RV and did not show pulmonary hypertension. I did  a right heart cath in 10/16 that showed normal filling pressures and no pulmonary hypertension.  She has recently become pregnant.  - Repeat echo: If RV is enlarged or noninvasive estimation of PA pressure is elevated, could consider eventual repeat workup for PH. This will also have prognostic implications for her pregnancy.  2. SLE: Manifests as malar rash, arthralgias, and ILD.  3. ILD: No longer using oxygen.  Suspect related to SLE.    If echo is normal, no further workup at this time.   Kayla [name] 02/16/2017

## 2017-02-17 ENCOUNTER — Encounter: Payer: Self-pay | Admitting: Obstetrics & Gynecology

## 2017-02-17 DIAGNOSIS — O98219 Gonorrhea complicating pregnancy, unspecified trimester: Secondary | ICD-10-CM | POA: Insufficient documentation

## 2017-02-17 LAB — CREATININE CLEARANCE, URINE, 24 HOUR
CREATININE 24H UR: 1380 mg/(24.h) (ref 800–1800)
CREATININE: 0.7 mg/dL (ref 0.57–1.00)
Creatinine Clearance: 137 mL/min — ABNORMAL HIGH (ref 88–128)
Creatinine, Urine: 120 mg/dL
GFR calc Af Amer: 137 mL/min/{1.73_m2} (ref >59)
GFR calc non Af Amer: 119 mL/min/{1.73_m2} (ref >59)

## 2017-02-17 LAB — PROTEIN, URINE, 24 HOUR
Protein, 24H Urine: 120 mg/24 hr (ref 30–150)
Protein, Ur: 10.4 mg/dL

## 2017-02-20 LAB — URINE CULTURE, OB REFLEX

## 2017-02-20 LAB — CULTURE, OB URINE

## 2017-02-24 ENCOUNTER — Ambulatory Visit (HOSPITAL_COMMUNITY)
Admission: RE | Admit: 2017-02-24 | Discharge: 2017-02-24 | Disposition: A | Discharge status: Home / Self Care | Payer: Medicare Other | Source: Ambulatory Visit | Attending: Obstetrics & Gynecology | Admitting: Obstetrics & Gynecology

## 2017-02-27 ENCOUNTER — Other Ambulatory Visit: Payer: Self-pay | Admitting: Obstetrics & Gynecology

## 2017-02-27 MED ORDER — CEPHALEXIN 500 MG PO CAPS: 500.0000 mg | ORAL_CAPSULE | Freq: Four times a day (QID) | ORAL | 0 refills | Status: DC

## 2017-02-28 ENCOUNTER — Ambulatory Visit (HOSPITAL_COMMUNITY)
Admission: RE | Admit: 2017-02-28 | Discharge: 2017-02-28 | Disposition: A | Discharge status: Home / Self Care | Payer: Medicare Other | Source: Ambulatory Visit | Attending: Cardiology | Admitting: Cardiology

## 2017-02-28 ENCOUNTER — Encounter (HOSPITAL_COMMUNITY): Payer: Self-pay

## 2017-02-28 ENCOUNTER — Telehealth: Payer: Self-pay | Admitting: *Deleted

## 2017-02-28 ENCOUNTER — Inpatient Hospital Stay (HOSPITAL_COMMUNITY)
Admission: AD | Admit: 2017-02-28 | Discharge: 2017-02-28 | Disposition: A | Discharge status: Home / Self Care | Payer: Medicare Other | Source: Ambulatory Visit | Attending: Family Medicine | Admitting: Family Medicine

## 2017-02-28 DIAGNOSIS — Z885 Allergy status to narcotic agent status: Secondary | ICD-10-CM | POA: Diagnosis not present

## 2017-02-28 DIAGNOSIS — O23591 Infection of other part of genital tract in pregnancy, first trimester: Secondary | ICD-10-CM | POA: Diagnosis not present

## 2017-02-28 DIAGNOSIS — B9689 Other specified bacterial agents as the cause of diseases classified elsewhere: Secondary | ICD-10-CM | POA: Insufficient documentation

## 2017-02-28 DIAGNOSIS — Z3A09 9 weeks gestation of pregnancy: Secondary | ICD-10-CM | POA: Diagnosis not present

## 2017-02-28 DIAGNOSIS — Z833 Family history of diabetes mellitus: Secondary | ICD-10-CM | POA: Insufficient documentation

## 2017-02-28 DIAGNOSIS — M545 Low back pain, unspecified: Secondary | ICD-10-CM

## 2017-02-28 DIAGNOSIS — O9989 Other specified diseases and conditions complicating pregnancy, childbirth and the puerperium: Secondary | ICD-10-CM | POA: Diagnosis not present

## 2017-02-28 DIAGNOSIS — M3500 Sicca syndrome, unspecified: Secondary | ICD-10-CM | POA: Insufficient documentation

## 2017-02-28 DIAGNOSIS — Z79899 Other long term (current) drug therapy: Secondary | ICD-10-CM | POA: Diagnosis not present

## 2017-02-28 DIAGNOSIS — O26891 Other specified pregnancy related conditions, first trimester: Secondary | ICD-10-CM

## 2017-02-28 DIAGNOSIS — Z881 Allergy status to other antibiotic agents status: Secondary | ICD-10-CM | POA: Diagnosis not present

## 2017-02-28 DIAGNOSIS — Z87891 Personal history of nicotine dependence: Secondary | ICD-10-CM | POA: Insufficient documentation

## 2017-02-28 DIAGNOSIS — I2729 Other secondary pulmonary hypertension: Secondary | ICD-10-CM | POA: Diagnosis not present

## 2017-02-28 DIAGNOSIS — Z7982 Long term (current) use of aspirin: Secondary | ICD-10-CM | POA: Insufficient documentation

## 2017-02-28 DIAGNOSIS — N760 Acute vaginitis: Secondary | ICD-10-CM | POA: Insufficient documentation

## 2017-02-28 DIAGNOSIS — R0600 Dyspnea, unspecified: Secondary | ICD-10-CM

## 2017-02-28 LAB — URINALYSIS, ROUTINE W REFLEX MICROSCOPIC
Bilirubin Urine: NEGATIVE
GLUCOSE, UA: NEGATIVE mg/dL
Hgb urine dipstick: NEGATIVE
KETONES UR: NEGATIVE mg/dL
Leukocytes, UA: NEGATIVE
Nitrite: NEGATIVE
PROTEIN: 30 mg/dL — AB
Specific Gravity, Urine: 1.027 (ref 1.005–1.030)
pH: 7 (ref 5.0–8.0)

## 2017-02-28 LAB — WET PREP, GENITAL
Sperm: NONE SEEN
TRICH WET PREP: NONE SEEN
YEAST WET PREP: NONE SEEN

## 2017-02-28 MED ORDER — CYCLOBENZAPRINE HCL 5 MG PO TABS: 5.0000 mg | ORAL_TABLET | Freq: Three times a day (TID) | ORAL | 0 refills | Status: DC | PRN

## 2017-02-28 MED ORDER — METRONIDAZOLE 500 MG PO TABS: 500.0000 mg | ORAL_TABLET | Freq: Two times a day (BID) | ORAL | 0 refills | Status: AC

## 2017-02-28 NOTE — MAU Note (Signed)
Patient c/o sharp lower back pain that started 2-3 days ago. Rating pain 7-8 at this time; tried tylenol with no relief. Reporting vaginal itching but no discharge or bleeding at this time. Patient expressed concern for yeast infection .

## 2017-02-28 NOTE — Progress Notes (Signed)
  Echocardiogram 2D Echocardiogram has been performed.  Kayla [name] 02/28/2017, 10:10 AM

## 2017-02-28 NOTE — Discharge Instructions (Signed)

## 2017-02-28 NOTE — MAU Provider Note (Signed)
History     CSN: [id number]  Arrival date and time: 02/28/17 1023   First Provider Initiated Contact with Patient 02/28/17 1132      Chief Complaint  Patient presents with  . Vaginal Itching  . Back Pain   Patient is a 27 year old G3 P0110 at 9 weeks and 0 days who presents to the MA U with complaints of low back pain that is achy in nature and as bad as a 6 or 7 out of 10 at times. She is tried taking Tylenol without significant improvement in this pain. She denies any upper back pain that would be consistent with CVA tenderness. She denies dysuria or frequency but does report some vaginal itching. She reports she has no associated vaginal discharge however. She reports no vaginal bleeding and no lower uterine cramping.   Vaginal Itching  The patient's primary symptoms include genital itching and missed menses. The patient's pertinent negatives include no genital odor, genital rash, vaginal bleeding or vaginal discharge. This is a new problem. The current episode started yesterday. The problem occurs constantly. The problem has been unchanged. Associated symptoms include back pain. Pertinent negatives include no abdominal pain, chills, constipation, diarrhea, dysuria, fever, frequency, nausea or vomiting.    OB History    Gravida Para Term Preterm AB Living   3 1 0 1 1 0   SAB TAB Ectopic Multiple Live Births   1 0 0 0 0      Past Medical History:  Diagnosis Date  . Arthritis    "hands and legs" (01/08/2015)  . CAP (community acquired pneumonia) 01/07/2015  . Daily headache    "sometimes" (01/08/2015)  . GERD (gastroesophageal reflux disease)   . Hypertension   . Lung disease   . Lupus   . Pulmonary hypertension   . Sjogren's syndrome [facility])     Past Surgical History:  Procedure Laterality Date  . CARDIAC CATHETERIZATION N/A 09/09/2015   Procedure: Right Heart Cath;  Surgeon: Laurey Morale, MD;  Location: [facility] INVASIVE CV LAB;  Service: Cardiovascular;  Laterality: N/A;   . DILATION AND EVACUATION N/A 07/13/2015   Procedure: DILATATION AND EVACUATION;  Surgeon: Levie Heritage, DO;  Location: [facility] ORS;  Service: Gynecology;  Laterality: N/A;  . FINGER SURGERY Right 03/2014   "laceration, nerve/artery injury" 2nd digit  . VIDEO BRONCHOSCOPY Bilateral 01/12/2015   Procedure: VIDEO BRONCHOSCOPY WITH FLUORO;  Surgeon: Leslye Peer, MD;  Location: [facility] ENDOSCOPY;  Service: Cardiopulmonary;  Laterality: Bilateral;    Family History  Problem Relation Age of Onset  . Diabetes Mother   . Diabetes Maternal Aunt   . Diabetes Maternal Grandmother     Social History  Substance Use Topics  . Smoking status: Former Smoker    Packs/day: 0.10    Years: 5.00    Types: Cigarettes    Quit date: 11/20/2014  . Smokeless tobacco: Former Neurosurgeon  . Alcohol use No     Comment: 01/08/2015 "last drink was New Year's Eve"    Allergies:  Allergies  Allergen Reactions  . Hydrocodone Nausea And Vomiting  . Zithromax [Azithromycin] Itching and Cough    Prescriptions Prior to Admission  Medication Sig Dispense Refill Last Dose  . albuterol (PROVENTIL HFA;VENTOLIN HFA) 108 (90 Base) MCG/ACT inhaler Inhale 2 puffs into the lungs every 4 (four) hours as needed for wheezing or shortness of breath. (Patient not taking: Reported on 01/30/2017) 1 Inhaler 5 Not Taking  . aspirin 81 MG chewable tablet Chew 1 tablet (  81 mg total) by mouth daily. (Patient taking differently: Chew 81 mg by mouth daily as needed (chest pain). ) 30 tablet 5 Taking  . cephALEXin (KEFLEX) 500 MG capsule Take 1 capsule (500 mg total) by mouth 4 (four) times daily. 28 capsule 0   . diphenhydrAMINE (BENADRYL) 25 mg capsule Take 25 mg by mouth every 6 (six) hours as needed for allergies.   Taking  . folic acid (FOLVITE) 1 MG tablet Take 1 mg by mouth daily.   Taking  . hydroxychloroquine (PLAQUENIL) 200 MG tablet Take 200 mg by mouth daily.   Taking  . predniSONE (DELTASONE) 5 MG tablet Take 10 mg by mouth daily.    Taking  . Prenatal Vit-Fe Fumarate-FA (PRENATAL COMPLETE) 14-0.4 MG TABS Take 1 tablet by mouth daily. 30 each 0 Taking  . tacrolimus (PROTOPIC) 0.1 % ointment APP AA ON THE SKIN D PRN UTD  5 Taking    Review of Systems  Constitutional: Negative for chills and fever.  HENT: Negative for congestion and rhinorrhea.   Respiratory: Negative for cough and shortness of breath.   Cardiovascular: Negative for chest pain and palpitations.  Gastrointestinal: Negative for abdominal distention, abdominal pain, constipation, diarrhea, nausea and vomiting.  Genitourinary: Positive for missed menses. Negative for difficulty urinating, dysuria, frequency and vaginal discharge.  Musculoskeletal: Positive for back pain. Negative for arthralgias.  Neurological: Negative for dizziness and weakness.   Physical Exam   Blood pressure 121/76, pulse 93, temperature 98.2 F (36.8 C), temperature source Oral, resp. rate 18, height  (1.651 m), weight 173 lb (78.5 kg), last menstrual period 12/27/2016, SpO2 98 %.  Physical Exam  Constitutional: She is oriented to person, place, and time. She appears well-developed and well-nourished.  HENT:  Head: Normocephalic and atraumatic.  Cardiovascular: Normal rate, regular rhythm, normal heart sounds and intact distal pulses.   No murmur heard. Respiratory: Effort normal and breath sounds normal. No respiratory distress.  GI: Soft. She exhibits no distension. There is no tenderness. There is no rebound and no guarding.  Genitourinary:  Genitourinary Comments: Significant thin white vaginal discharge no cervical motion tenderness no signs of cervicitis healthy pink vaginal mucosa  Musculoskeletal: Normal range of motion. She exhibits no edema.  Minimal tenderness to palpation over the bilateral lumbar paraspinal region.  Neurological: She is alert and oriented to person, place, and time. No cranial nerve deficit.  Skin: Skin is warm and dry.  Psychiatric: She has a  normal mood and affect. Her behavior is normal.    MAU Course  Procedures  MDM In MA U patient underwent physical exam which was unremarkable and testing with gonorrhea and chlamydia and wet prep. Wet prep did reveal bacterial vaginosis. She was prescribed Flagyl 500 mg twice a day for 1 week.  Starts her back pain goes it appears to be musculoskeletal. Advised heating pad but did prescribe some limited amounts of Flagyl if heating pad does not improve her pain. Assessment and Plan  #1: Bacterial vaginosis Flagyl 500 mg twice a day #2: Musculoskeletal low back pain: Recommend heating pad did prescribed Flexeril.  Ernestina Penna 02/28/2017, 11:46 AM

## 2017-02-28 NOTE — Telephone Encounter (Signed)
Per message from Dr. Penne Lash called patient and notified her of uti and antibiotic(keflex) sent to her pharmacy. She states she is in mau currently with maybe yeast infection.  She voices understanding.

## 2017-03-01 LAB — GC/CHLAMYDIA PROBE AMP (~~LOC~~) NOT AT ARMC
Chlamydia: NEGATIVE
NEISSERIA GONORRHEA: NEGATIVE

## 2017-03-01 LAB — CULTURE, OB URINE: Culture: 20000 — AB

## 2017-03-03 ENCOUNTER — Encounter (HOSPITAL_COMMUNITY): Payer: Self-pay

## 2017-03-03 ENCOUNTER — Ambulatory Visit (HOSPITAL_COMMUNITY)
Admission: RE | Admit: 2017-03-03 | Discharge: 2017-03-03 | Disposition: A | Discharge status: Home / Self Care | Payer: Medicare Other | Source: Ambulatory Visit | Attending: Obstetrics & Gynecology | Admitting: Obstetrics & Gynecology

## 2017-03-03 VITALS — BP 117/81 | HR 109 | Wt 172.6 lb

## 2017-03-03 DIAGNOSIS — O99891 Other specified diseases and conditions complicating pregnancy: Secondary | ICD-10-CM

## 2017-03-03 DIAGNOSIS — O23591 Infection of other part of genital tract in pregnancy, first trimester: Secondary | ICD-10-CM | POA: Diagnosis not present

## 2017-03-03 DIAGNOSIS — O9989 Other specified diseases and conditions complicating pregnancy, childbirth and the puerperium: Secondary | ICD-10-CM

## 2017-03-03 DIAGNOSIS — Z3A00 Weeks of gestation of pregnancy not specified: Secondary | ICD-10-CM | POA: Insufficient documentation

## 2017-03-03 DIAGNOSIS — B9689 Other specified bacterial agents as the cause of diseases classified elsewhere: Secondary | ICD-10-CM | POA: Insufficient documentation

## 2017-03-03 DIAGNOSIS — N760 Acute vaginitis: Secondary | ICD-10-CM | POA: Insufficient documentation

## 2017-03-03 DIAGNOSIS — M329 Systemic lupus erythematosus, unspecified: Secondary | ICD-10-CM

## 2017-03-03 DIAGNOSIS — O10919 Unspecified pre-existing hypertension complicating pregnancy, unspecified trimester: Secondary | ICD-10-CM

## 2017-03-03 DIAGNOSIS — O26891 Other specified pregnancy related conditions, first trimester: Secondary | ICD-10-CM | POA: Insufficient documentation

## 2017-03-03 DIAGNOSIS — O0990 Supervision of high risk pregnancy, unspecified, unspecified trimester: Secondary | ICD-10-CM

## 2017-03-03 DIAGNOSIS — J849 Interstitial pulmonary disease, unspecified: Secondary | ICD-10-CM

## 2017-03-03 DIAGNOSIS — R7689 Other specified abnormal immunological findings in serum: Secondary | ICD-10-CM

## 2017-03-03 DIAGNOSIS — R768 Other specified abnormal immunological findings in serum: Secondary | ICD-10-CM

## 2017-03-03 NOTE — Consult Note (Signed)
Kayla [name] comes in today to discuss her echocardiogram results. She underwnt echo on 4/10 and I have the report for review. There are no indirect signs of increased pulmonary tree pressures on my review, but the official review from her cardiologist is pending. She has been diagnosed with bacterial vaginosis since our last visit and is on metronidazole. She reports no changes in her cardiopulmonary status Given the apparent absence of pulmonary hypertension, no immediate interventions are necessary over and above my recommendations on 3/27. She will follow up for a first trimester scan and NT evaluation on 03/27/2017  Thank you for the opportunity to be a part of the care of [employer]. Please contact our office if we can be of further assistance.   I spent approximately 15 minutes with this patient with over 50% of time spent in face-to-face counseling.

## 2017-03-08 ENCOUNTER — Encounter (HOSPITAL_COMMUNITY): Payer: Self-pay | Admitting: *Deleted

## 2017-03-08 ENCOUNTER — Inpatient Hospital Stay (HOSPITAL_COMMUNITY)
Admission: AD | Admit: 2017-03-08 | Discharge: 2017-03-08 | Disposition: A | Discharge status: Home / Self Care | Payer: Medicare Other | Source: Ambulatory Visit | Attending: Obstetrics & Gynecology | Admitting: Obstetrics & Gynecology

## 2017-03-08 DIAGNOSIS — N898 Other specified noninflammatory disorders of vagina: Secondary | ICD-10-CM

## 2017-03-08 DIAGNOSIS — Z87891 Personal history of nicotine dependence: Secondary | ICD-10-CM | POA: Diagnosis not present

## 2017-03-08 DIAGNOSIS — Z3A10 10 weeks gestation of pregnancy: Secondary | ICD-10-CM | POA: Insufficient documentation

## 2017-03-08 DIAGNOSIS — B3731 Acute candidiasis of vulva and vagina: Secondary | ICD-10-CM

## 2017-03-08 DIAGNOSIS — O209 Hemorrhage in early pregnancy, unspecified: Secondary | ICD-10-CM | POA: Diagnosis present

## 2017-03-08 DIAGNOSIS — B373 Candidiasis of vulva and vagina: Secondary | ICD-10-CM

## 2017-03-08 DIAGNOSIS — O98811 Other maternal infectious and parasitic diseases complicating pregnancy, first trimester: Secondary | ICD-10-CM | POA: Insufficient documentation

## 2017-03-08 LAB — URINALYSIS, ROUTINE W REFLEX MICROSCOPIC
Bacteria, UA: NONE SEEN
Bilirubin Urine: NEGATIVE
GLUCOSE, UA: NEGATIVE mg/dL
Hgb urine dipstick: NEGATIVE
Ketones, ur: NEGATIVE mg/dL
Nitrite: NEGATIVE
PH: 5 (ref 5.0–8.0)
PROTEIN: NEGATIVE mg/dL
Specific Gravity, Urine: 1.032 — ABNORMAL HIGH (ref 1.005–1.030)

## 2017-03-08 LAB — WET PREP, GENITAL
CLUE CELLS WET PREP: NONE SEEN
Sperm: NONE SEEN
Trich, Wet Prep: NONE SEEN

## 2017-03-08 MED ORDER — TERCONAZOLE 0.4 % VA CREA: 1.0000 | TOPICAL_CREAM | Freq: Every day | VAGINAL | 0 refills | Status: DC

## 2017-03-08 MED ORDER — NYSTATIN 100000 UNIT/GM EX CREA: TOPICAL_CREAM | CUTANEOUS | 0 refills | Status: DC

## 2017-03-08 NOTE — Discharge Instructions (Signed)
Vaginitis Vaginitis is an inflammation of the vagina. It can happen when the normal bacteria and yeast in the vagina grow too much. There are different types. Treatment will depend on the type you have. Follow these instructions at home:  Take all medicines as told by your doctor.  Keep your vagina area clean and dry. Avoid soap. Rinse the area with water.  Avoid washing and cleaning out the vagina (douching).  Do not use tampons or have sex (intercourse) until your treatment is done.  Wipe from front to back after going to the restroom.  Wear cotton underwear.  Avoid wearing underwear while you sleep until your vaginitis is gone.  Avoid tight pants. Avoid underwear or nylons without a cotton panel.  Take off wet clothing (such as a bathing suit) as soon as you can.  Use mild, unscented products. Avoid fabric softeners and scented:  Feminine sprays.  Laundry detergents.  Tampons.  Soaps or bubble baths.  Practice safe sex and use condoms. Get help right away if:  You have belly (abdominal) pain.  You have a fever or lasting symptoms for more than 2-3 days.  You have a fever and your symptoms suddenly get worse. This information is not intended to replace advice given to you by your health care provider. Make sure you discuss any questions you have with your health care provider. Document Released: 02/03/2009 Document Revised: 04/14/2016 Document Reviewed: 04/19/2012 Elsevier Interactive Patient Education  2017 Elsevier Inc. Vaginal Yeast infection, Adult Vaginal yeast infection is a condition that causes soreness, swelling, and redness (inflammation) of the vagina. It also causes vaginal discharge. This is a common condition. Some women get this infection frequently. What are the causes? This condition is caused by a change in the normal balance of the yeast (candida) and bacteria that live in the vagina. This change causes an overgrowth of yeast, which causes the  inflammation. What increases the risk? This condition is more likely to develop in:  Women who take antibiotic medicines.  Women who have diabetes.  Women who take birth control pills.  Women who are pregnant.  Women who douche often.  Women who have a weak defense (immune) system.  Women who have been taking steroid medicines for a long time.  Women who frequently wear tight clothing. What are the signs or symptoms? Symptoms of this condition include:  White, thick vaginal discharge.  Swelling, itching, redness, and irritation of the vagina. The lips of the vagina (vulva) may be affected as well.  Pain or a burning feeling while urinating.  Pain during sex. How is this diagnosed? This condition is diagnosed with a medical history and physical exam. This will include a pelvic exam. Your health care provider will examine a sample of your vaginal discharge under a microscope. Your health care provider may send this sample for testing to confirm the diagnosis. How is this treated? This condition is treated with medicine. Medicines may be over-the-counter or prescription. You may be told to use one or more of the following:  Medicine that is taken orally.  Medicine that is applied as a cream.  Medicine that is inserted directly into the vagina (suppository). Follow these instructions at home:  Take or apply over-the-counter and prescription medicines only as told by your health care provider.  Do not have sex until your health care provider has approved. Tell your sex partner that you have a yeast infection. That person should go to his or her health care provider if he   or she develops symptoms.  Do not wear tight clothes, such as pantyhose or tight pants.  Avoid using tampons until your health care provider approves.  Eat more yogurt. This may help to keep your yeast infection from returning.  Try taking a sitz bath to help with discomfort. This is a warm water bath  that is taken while you are sitting down. The water should only come up to your hips and should cover your buttocks. Do this 3-4 times per day or as told by your health care provider.  Do not douche.  Wear breathable, cotton underwear.  If you have diabetes, keep your blood sugar levels under control. Contact a health care provider if:  You have a fever.  Your symptoms go away and then return.  Your symptoms do not get better with treatment.  Your symptoms get worse.  You have new symptoms.  You develop blisters in or around your vagina.  You have blood coming from your vagina and it is not your menstrual period.  You develop pain in your abdomen. This information is not intended to replace advice given to you by your health care provider. Make sure you discuss any questions you have with your health care provider. Document Released: 08/17/2005 Document Revised: 04/20/2016 Document Reviewed: 05/11/2015 Elsevier Interactive Patient Education  2017 Elsevier Inc.   

## 2017-03-08 NOTE — MAU Note (Signed)
Pt presents with c/o small vaginal bleeding that began yesterday.  States bleeding noted only w/ wiping.  Denies abdominal cramping.  Pt was recently treated for BV, completed antibiotics on Monday, March 06, 2017.

## 2017-03-08 NOTE — MAU Provider Note (Signed)
History     CSN: [id number]  Arrival date and time: 03/08/17 1513   First Provider Initiated Contact with Patient 03/08/17 1635      Chief Complaint  Patient presents with  . Vaginal Bleeding   HPI   [name] is a 26 y.o. female G3P0110 @ [redacted]w[redacted]d here in MAU with vaginal bleeding. She notices the bleeding only when she wipes. The bleeding started yesterday, no recent intercourse.  No pain. + vaginal irritation with vaginitis.   OB History    Gravida Para Term Preterm AB Living   3 1 0 1 1 0   SAB TAB Ectopic Multiple Live Births   1 0 0 0 0      Past Medical History:  Diagnosis Date  . Arthritis    "hands and legs" (01/08/2015)  . CAP (community acquired pneumonia) 01/07/2015  . Daily headache    "sometimes" (01/08/2015)  . GERD (gastroesophageal reflux disease)   . Hypertension   . Lung disease   . Lupus   . Pulmonary hypertension (HCC)   . Sjogren's syndrome [facility])     Past Surgical History:  Procedure Laterality Date  . CARDIAC CATHETERIZATION N/A 09/09/2015   Procedure: Right Heart Cath;  Surgeon: Laurey Morale, MD;  Location: [facility] INVASIVE CV LAB;  Service: Cardiovascular;  Laterality: N/A;  . DILATION AND EVACUATION N/A 07/13/2015   Procedure: DILATATION AND EVACUATION;  Surgeon: Levie Heritage, DO;  Location: [facility] ORS;  Service: Gynecology;  Laterality: N/A;  . FINGER SURGERY Right 03/2014   "laceration, nerve/artery injury" 2nd digit  . VIDEO BRONCHOSCOPY Bilateral 01/12/2015   Procedure: VIDEO BRONCHOSCOPY WITH FLUORO;  Surgeon: Leslye Peer, MD;  Location: [facility] ENDOSCOPY;  Service: Cardiopulmonary;  Laterality: Bilateral;    Family History  Problem Relation Age of Onset  . Diabetes Mother   . Diabetes Maternal Aunt   . Diabetes Maternal Grandmother     Social History  Substance Use Topics  . Smoking status: Former Smoker    Packs/day: 0.10    Years: 5.00    Types: Cigarettes    Quit date: 11/20/2014  . Smokeless tobacco: Former Neurosurgeon  .  Alcohol use No     Comment: 01/08/2015 "last drink was New Year's Eve"    Allergies:  Allergies  Allergen Reactions  . Hydrocodone Nausea And Vomiting  . Zithromax [Azithromycin] Itching and Cough    Prescriptions Prior to Admission  Medication Sig Dispense Refill Last Dose  . acetaminophen (TYLENOL) 325 MG tablet Take 650 mg by mouth every 6 (six) hours as needed for headache.   03/07/2017 at Unknown time  . folic acid (FOLVITE) 1 MG tablet Take 1 mg by mouth daily.   03/08/2017 at Unknown time  . hydroxychloroquine (PLAQUENIL) 200 MG tablet Take 200 mg by mouth daily.   03/08/2017 at Unknown time  . predniSONE (DELTASONE) 5 MG tablet Take 10 mg by mouth daily.   03/08/2017 at Unknown time  . Prenatal Vit-Fe Fumarate-FA (PRENATAL COMPLETE) 14-0.4 MG TABS Take 1 tablet by mouth daily. 30 each 0 03/08/2017 at Unknown time  . tacrolimus (PROTOPIC) 0.1 % ointment Apply 1 application topically 2 (two) times daily as needed (For eczema.).   Past Month at Unknown time  . cephALEXin (KEFLEX) 500 MG capsule Take 1 capsule (500 mg total) by mouth 4 (four) times daily. (Patient not taking: Reported on 02/28/2017) 28 capsule 0 Completed Course at Unknown time  . cyclobenzaprine (FLEXERIL) 5 MG tablet Take 1 tablet (5  mg total) by mouth 3 (three) times daily as needed for muscle spasms. (Patient not taking: Reported on 03/03/2017) 10 tablet 0 Not Taking at Unknown time   Results for orders placed or performed during the hospital encounter of 03/08/17 (from the past 48 hour(s))  Urinalysis, Routine w reflex microscopic     Status: Abnormal   Collection Time: 03/08/17  3:21 PM  Result Value Ref Range   Color, Urine YELLOW YELLOW   APPearance HAZY (A) CLEAR   Specific Gravity, Urine 1.032 (H) 1.005 - 1.030   pH 5.0 5.0 - 8.0   Glucose, UA NEGATIVE NEGATIVE mg/dL   Hgb urine dipstick NEGATIVE NEGATIVE   Bilirubin Urine NEGATIVE NEGATIVE   Ketones, ur NEGATIVE NEGATIVE mg/dL   Protein, ur NEGATIVE NEGATIVE  mg/dL   Nitrite NEGATIVE NEGATIVE   Leukocytes, UA TRACE (A) NEGATIVE   RBC / HPF 0-5 0 - 5 RBC/hpf   WBC, UA 0-5 0 - 5 WBC/hpf   Bacteria, UA NONE SEEN NONE SEEN   Squamous Epithelial / LPF 0-5 (A) NONE SEEN   Mucous PRESENT   Wet prep, genital     Status: Abnormal   Collection Time: 03/08/17  4:50 PM  Result Value Ref Range   Yeast Wet Prep HPF POC PRESENT (A) NONE SEEN   Trich, Wet Prep NONE SEEN NONE SEEN   Clue Cells Wet Prep HPF POC NONE SEEN NONE SEEN   WBC, Wet Prep HPF POC MANY (A) NONE SEEN   Sperm NONE SEEN     Review of Systems  Constitutional: Negative for fever.  Gastrointestinal: Negative for abdominal pain.  Genitourinary: Positive for vaginal bleeding.   Physical Exam   Blood pressure 98/73, pulse (!) 110, temperature 98.9 F (37.2 C), temperature source Oral, resp. rate 20, last menstrual period 12/27/2016, SpO2 99 %.  Physical Exam  Constitutional: She is oriented to person, place, and time. She appears well-developed and well-nourished. No distress.  HENT:  Head: Normocephalic.  Eyes: Pupils are equal, round, and reactive to light.  Respiratory: Effort normal.  Genitourinary:  Genitourinary Comments: Vagina - Small amount of white vaginal discharge, no odor  Cervix - No contact bleeding, no active bleeding  Wet prep collected   Musculoskeletal: Normal range of motion.  Neurological: She is alert and oriented to person, place, and time.  Skin: Skin is warm. She is not diaphoretic.  Psychiatric: Her behavior is normal.    MAU Course  Procedures  None  MDM  Active fetus on bedside US Wet prep/ UA O positive blood type   Assessment and Plan   A:  1. Yeast vaginitis   2. Vaginal irritation     P:  Discharge home in stable condition Rx: Terazol, Nystatin Return to MAU if symptoms worsen Follow up with OB  Duane Lope, NP 03/08/2017 7:29 PM

## 2017-03-13 ENCOUNTER — Other Ambulatory Visit (HOSPITAL_COMMUNITY)
Admission: RE | Admit: 2017-03-13 | Discharge: 2017-03-13 | Disposition: A | Discharge status: Home / Self Care | Payer: Medicare Other | Source: Ambulatory Visit | Attending: Obstetrics & Gynecology | Admitting: Obstetrics & Gynecology

## 2017-03-13 ENCOUNTER — Ambulatory Visit (INDEPENDENT_AMBULATORY_CARE_PROVIDER_SITE_OTHER): Payer: Medicare Other | Admitting: Obstetrics & Gynecology

## 2017-03-13 VITALS — BP 115/74 | HR 95 | Wt 173.0 lb

## 2017-03-13 DIAGNOSIS — M3502 Sicca syndrome with lung involvement: Secondary | ICD-10-CM

## 2017-03-13 DIAGNOSIS — Z113 Encounter for screening for infections with a predominantly sexual mode of transmission: Secondary | ICD-10-CM | POA: Diagnosis not present

## 2017-03-13 DIAGNOSIS — O0990 Supervision of high risk pregnancy, unspecified, unspecified trimester: Secondary | ICD-10-CM

## 2017-03-13 DIAGNOSIS — O99111 Other diseases of the blood and blood-forming organs and certain disorders involving the immune mechanism complicating pregnancy, first trimester: Secondary | ICD-10-CM | POA: Diagnosis present

## 2017-03-13 DIAGNOSIS — I10 Essential (primary) hypertension: Secondary | ICD-10-CM

## 2017-03-13 DIAGNOSIS — O99511 Diseases of the respiratory system complicating pregnancy, first trimester: Secondary | ICD-10-CM | POA: Diagnosis not present

## 2017-03-13 DIAGNOSIS — O10911 Unspecified pre-existing hypertension complicating pregnancy, first trimester: Secondary | ICD-10-CM

## 2017-03-13 DIAGNOSIS — O0991 Supervision of high risk pregnancy, unspecified, first trimester: Secondary | ICD-10-CM

## 2017-03-13 DIAGNOSIS — O98211 Gonorrhea complicating pregnancy, first trimester: Secondary | ICD-10-CM

## 2017-03-13 DIAGNOSIS — R768 Other specified abnormal immunological findings in serum: Secondary | ICD-10-CM

## 2017-03-13 DIAGNOSIS — O10919 Unspecified pre-existing hypertension complicating pregnancy, unspecified trimester: Secondary | ICD-10-CM

## 2017-03-13 DIAGNOSIS — O99519 Diseases of the respiratory system complicating pregnancy, unspecified trimester: Secondary | ICD-10-CM

## 2017-03-13 MED ORDER — ASPIRIN EC 81 MG PO TBEC: 81.0000 mg | DELAYED_RELEASE_TABLET | Freq: Every day | ORAL | 2 refills | Status: DC

## 2017-03-14 LAB — CERVICOVAGINAL ANCILLARY ONLY
Chlamydia: NEGATIVE
Neisseria Gonorrhea: NEGATIVE

## 2017-03-14 NOTE — Progress Notes (Signed)
   PRENATAL VISIT NOTE  Subjective:  Kayla [name] is a 26 y.o. G3P0110 at [redacted]w[redacted]d being seen today for ongoing prenatal care.  She is currently monitored for the following issues for this high-risk pregnancy and has Interstitial lung disease (HCC); Supervision of high risk pregnancy, antepartum; Systemic lupus erythematosus (SLE) affecting pregnancy, antepartum (HCC); Lupus (systemic lupus erythematosus) (HCC); Loud P2 (pulmonary S2, second heart sound); Insomnia; Other secondary pulmonary hypertension (HCC); Chronic hypertension; Chronic hypertension during pregnancy, antepartum; Sjogren's syndrome (HCC); Chronic Respiratory failure with oxygen requirement affecting pregnancy, antepartum; SS-A antibody positive; SS-B antibody positive; History of fetal loss; and Gonorrhea affecting pregnancy on her problem list.  Patient reports no complaints.  Contractions: Not present. Vag. Bleeding: None.  Movement: Present. Denies leaking of fluid.   The following portions of the patient's history were reviewed and updated as appropriate: allergies, current medications, past family history, past medical history, past social history, past surgical history and problem list. Problem list updated.  Objective:   Vitals:   03/13/17 1007  BP: 115/74  Pulse: 95  Weight: 173 lb (78.5 kg)    Fetal Status: Fetal Heart Rate (bpm):  169   Movement: Present     General:  Alert, oriented and cooperative. Patient is in no acute distress.  Skin: Skin is warm and dry. No rash noted.   Cardiovascular: Normal heart rate noted  Respiratory: Normal respiratory effort, no problems with respiration noted  Abdomen: Soft, gravid, appropriate for gestational age. Pain/Pressure: Absent     Pelvic:  Cervical exam deferred        Extremities: Normal range of motion.  Edema: None  Mental Status: Normal mood and affect. Normal behavior. Normal judgment and thought content.   Assessment and Plan:  Pregnancy: G3P0110 at  [redacted]w[redacted]d  1. Sjogren's syndrome with lung involvement (HCC) - Complement, total - C3 and C4  2.  Genetic testing First screen is scheduled in May.    3. Chronic hypertension during pregnancy, antepartum Normotensive today.  No meds.  Start ASA 81 mg at 13 weeks.  4. Chronic Respiratory failure with oxygen requirement affecting pregnancy, antepartum No respiratory issues today.  No pulm HTN  5. Gonorrhea affecting pregnancy in first trimester TOC today  6. SS-A & SS-B antibody positive Needs fetal echo at 18 weeks and then followed by peds cards (risk of fetal heart block  Preterm labor symptoms and general obstetric precautions including but not limited to vaginal bleeding, contractions, leaking of fluid and fetal movement were reviewed in detail with the patient. Please refer to After Visit Summary for other counseling recommendations.  Return in about 4 weeks (around 04/10/2017).   Lesly Dukes, MD

## 2017-03-15 LAB — COMPLEMENT, TOTAL

## 2017-03-15 LAB — C3 AND C4
COMPLEMENT C3, SERUM: 125 mg/dL (ref 82–167)
Complement C4, Serum: 33 mg/dL (ref 14–44)

## 2017-03-27 ENCOUNTER — Ambulatory Visit (HOSPITAL_COMMUNITY)
Admission: RE | Admit: 2017-03-27 | Discharge: 2017-03-27 | Disposition: A | Discharge status: Home / Self Care | Payer: Medicare Other | Source: Ambulatory Visit | Attending: Obstetrics & Gynecology | Admitting: Obstetrics & Gynecology

## 2017-03-27 ENCOUNTER — Encounter (HOSPITAL_COMMUNITY): Payer: Self-pay

## 2017-03-27 DIAGNOSIS — O0991 Supervision of high risk pregnancy, unspecified, first trimester: Secondary | ICD-10-CM | POA: Diagnosis not present

## 2017-03-27 DIAGNOSIS — Z3A12 12 weeks gestation of pregnancy: Secondary | ICD-10-CM | POA: Diagnosis not present

## 2017-03-27 DIAGNOSIS — O0990 Supervision of high risk pregnancy, unspecified, unspecified trimester: Secondary | ICD-10-CM

## 2017-03-29 ENCOUNTER — Other Ambulatory Visit: Payer: Self-pay

## 2017-03-29 DIAGNOSIS — O0990 Supervision of high risk pregnancy, unspecified, unspecified trimester: Secondary | ICD-10-CM

## 2017-04-05 ENCOUNTER — Encounter: Payer: Self-pay | Admitting: Gynecology

## 2017-04-10 ENCOUNTER — Encounter: Payer: Self-pay | Admitting: Family Medicine

## 2017-04-12 ENCOUNTER — Encounter (HOSPITAL_COMMUNITY): Payer: Self-pay

## 2017-04-12 ENCOUNTER — Inpatient Hospital Stay (HOSPITAL_COMMUNITY)
Admission: AD | Admit: 2017-04-12 | Discharge: 2017-04-12 | Disposition: A | Discharge status: Home / Self Care | Payer: Medicare Other | Source: Ambulatory Visit | Attending: Obstetrics and Gynecology | Admitting: Obstetrics and Gynecology

## 2017-04-12 DIAGNOSIS — J302 Other seasonal allergic rhinitis: Secondary | ICD-10-CM | POA: Insufficient documentation

## 2017-04-12 DIAGNOSIS — M329 Systemic lupus erythematosus, unspecified: Secondary | ICD-10-CM | POA: Insufficient documentation

## 2017-04-12 DIAGNOSIS — Z3A15 15 weeks gestation of pregnancy: Secondary | ICD-10-CM | POA: Insufficient documentation

## 2017-04-12 DIAGNOSIS — O9989 Other specified diseases and conditions complicating pregnancy, childbirth and the puerperium: Secondary | ICD-10-CM | POA: Diagnosis not present

## 2017-04-12 DIAGNOSIS — O99612 Diseases of the digestive system complicating pregnancy, second trimester: Secondary | ICD-10-CM | POA: Insufficient documentation

## 2017-04-12 DIAGNOSIS — O162 Unspecified maternal hypertension, second trimester: Secondary | ICD-10-CM | POA: Diagnosis not present

## 2017-04-12 DIAGNOSIS — K219 Gastro-esophageal reflux disease without esophagitis: Secondary | ICD-10-CM | POA: Diagnosis not present

## 2017-04-12 DIAGNOSIS — R51 Headache: Secondary | ICD-10-CM | POA: Diagnosis present

## 2017-04-12 DIAGNOSIS — Z833 Family history of diabetes mellitus: Secondary | ICD-10-CM | POA: Insufficient documentation

## 2017-04-12 DIAGNOSIS — I272 Pulmonary hypertension, unspecified: Secondary | ICD-10-CM | POA: Diagnosis not present

## 2017-04-12 DIAGNOSIS — N898 Other specified noninflammatory disorders of vagina: Secondary | ICD-10-CM | POA: Diagnosis present

## 2017-04-12 DIAGNOSIS — M3500 Sicca syndrome, unspecified: Secondary | ICD-10-CM | POA: Insufficient documentation

## 2017-04-12 DIAGNOSIS — Z7982 Long term (current) use of aspirin: Secondary | ICD-10-CM | POA: Diagnosis not present

## 2017-04-12 DIAGNOSIS — Z87891 Personal history of nicotine dependence: Secondary | ICD-10-CM | POA: Insufficient documentation

## 2017-04-12 DIAGNOSIS — O26892 Other specified pregnancy related conditions, second trimester: Secondary | ICD-10-CM | POA: Diagnosis not present

## 2017-04-12 DIAGNOSIS — Z881 Allergy status to other antibiotic agents status: Secondary | ICD-10-CM | POA: Insufficient documentation

## 2017-04-12 DIAGNOSIS — O99512 Diseases of the respiratory system complicating pregnancy, second trimester: Secondary | ICD-10-CM | POA: Insufficient documentation

## 2017-04-12 DIAGNOSIS — Z885 Allergy status to narcotic agent status: Secondary | ICD-10-CM | POA: Insufficient documentation

## 2017-04-12 DIAGNOSIS — Z9889 Other specified postprocedural states: Secondary | ICD-10-CM | POA: Insufficient documentation

## 2017-04-12 DIAGNOSIS — R519 Headache, unspecified: Secondary | ICD-10-CM

## 2017-04-12 LAB — URINALYSIS, ROUTINE W REFLEX MICROSCOPIC
Bilirubin Urine: NEGATIVE
GLUCOSE, UA: NEGATIVE mg/dL
HGB URINE DIPSTICK: NEGATIVE
Ketones, ur: NEGATIVE mg/dL
Nitrite: NEGATIVE
Protein, ur: NEGATIVE mg/dL
SPECIFIC GRAVITY, URINE: 1.023 (ref 1.005–1.030)
pH: 6 (ref 5.0–8.0)

## 2017-04-12 LAB — WET PREP, GENITAL
Clue Cells Wet Prep HPF POC: NONE SEEN
SPERM: NONE SEEN
Trich, Wet Prep: NONE SEEN
YEAST WET PREP: NONE SEEN

## 2017-04-12 LAB — AMNISURE RUPTURE OF MEMBRANE (ROM) NOT AT ARMC: Amnisure ROM: NEGATIVE

## 2017-04-12 MED ORDER — FLUTICASONE PROPIONATE 50 MCG/ACT NA SUSP: 2.0000 | Freq: Every day | NASAL | 2 refills | Status: DC

## 2017-04-12 MED ORDER — CYCLOBENZAPRINE HCL 5 MG PO TABS
5.0000 mg | ORAL_TABLET | Freq: Once | ORAL | Status: AC
  Administered 2017-04-12: 5 mg via ORAL
  Filled 2017-04-12: qty 1

## 2017-04-12 MED ORDER — CYCLOBENZAPRINE HCL 10 MG PO TABS: 10.0000 mg | ORAL_TABLET | Freq: Two times a day (BID) | ORAL | 0 refills | Status: DC | PRN

## 2017-04-12 MED ORDER — IBUPROFEN 600 MG PO TABS
600.0000 mg | ORAL_TABLET | Freq: Once | ORAL | Status: AC
  Administered 2017-04-12: 600 mg via ORAL
  Filled 2017-04-12: qty 1

## 2017-04-12 NOTE — Progress Notes (Addendum)
g3p0 @ 15.[redacted] wksga. Presents to triage for r/o SROM and migraines. States leaking is during the night but unsure if caused by sneezing or coughing. Pt has hx of migraines but not on migraine meds. tyelenol PM taken around 1000  but ha still there. States med did not improved ha. Pain 8/10.   Doppler 172  1804: Provider at bs for spec exam and tests, fern and amnisure  1818: medicated per order.  1908: Patient states flexeril and Motrin helped.   1911: provider at bs reassessing pt and POC discussed  1929: Discharge instructions given with pt understanding. Pt left via ambulatory. Signature pad not working so pt signed the last page of the discharge instructions and paper clipped with pt's chart.

## 2017-04-12 NOTE — MAU Provider Note (Signed)
History     CSN: [id number]  Arrival date and time: 04/12/17 1647   First Provider Initiated Contact with Patient 04/12/17 1743      Chief Complaint  Patient presents with  . Rupture of Membranes  . Headache   HPI   Kayla [name] is a 26 y.o. female G3P0110 @ [redacted]w[redacted]d here in MAU with headache and leaking of fluid. The headache started last night and has not gone away. She tried taking Tylenol PM this morning which did not help. She has a history of HA and has taken medication in the past that she cannot recall the name of.   She also complains of leaking of fluid. She first noticed this 2 days ago. She cannot tell if it is urine or fluid. She is having to wear a panty line during the day due to the "leaking".   OB History    Gravida Para Term Preterm AB Living   3 1 0 1 1 0   SAB TAB Ectopic Multiple Live Births   1 0 0 0 0      Past Medical History:  Diagnosis Date  . Arthritis    "hands and legs" (01/08/2015)  . CAP (community acquired pneumonia) 01/07/2015  . Daily headache    "sometimes" (01/08/2015)  . GERD (gastroesophageal reflux disease)   . Hypertension   . Lung disease   . Lupus   . Pulmonary hypertension (HCC)   . Sjogren's syndrome [facility])     Past Surgical History:  Procedure Laterality Date  . CARDIAC CATHETERIZATION N/A 09/09/2015   Procedure: Right Heart Cath;  Surgeon: Laurey Morale, MD;  Location: [facility] INVASIVE CV LAB;  Service: Cardiovascular;  Laterality: N/A;  . DILATION AND EVACUATION N/A 07/13/2015   Procedure: DILATATION AND EVACUATION;  Surgeon: Levie Heritage, DO;  Location: [facility] ORS;  Service: Gynecology;  Laterality: N/A;  . FINGER SURGERY Right 03/2014   "laceration, nerve/artery injury" 2nd digit  . VIDEO BRONCHOSCOPY Bilateral 01/12/2015   Procedure: VIDEO BRONCHOSCOPY WITH FLUORO;  Surgeon: Leslye Peer, MD;  Location: [facility] ENDOSCOPY;  Service: Cardiopulmonary;  Laterality: Bilateral;    Family History  Problem Relation Age of Onset   . Diabetes Mother   . Diabetes Maternal Aunt   . Diabetes Maternal Grandmother     Social History  Substance Use Topics  . Smoking status: Former Smoker    Packs/day: 0.10    Years: 5.00    Types: Cigarettes    Quit date: 11/20/2014  . Smokeless tobacco: Former Neurosurgeon  . Alcohol use No     Comment: 01/08/2015 "last drink was New Year's Eve"    Allergies:  Allergies  Allergen Reactions  . Hydrocodone Nausea And Vomiting  . Zithromax [Azithromycin] Itching and Cough    Prescriptions Prior to Admission  Medication Sig Dispense Refill Last Dose  . acetaminophen (TYLENOL) 325 MG tablet Take 650 mg by mouth every 6 (six) hours as needed for headache.   Taking  . aspirin EC 81 MG tablet Take 1 tablet (81 mg total) by mouth daily. 30 tablet 2   . folic acid (FOLVITE) 1 MG tablet Take 1 mg by mouth daily.   Taking  . predniSONE (DELTASONE) 5 MG tablet Take 10 mg by mouth daily.   Taking  . Prenatal Vit-Fe Fumarate-FA (PRENATAL COMPLETE) 14-0.4 MG TABS Take 1 tablet by mouth daily. 30 each 0 Taking  . tacrolimus (PROTOPIC) 0.1 % ointment Apply 1 application topically 2 (two) times daily as  needed (For eczema.).   Taking   Results for orders placed or performed during the hospital encounter of 04/12/17 (from the past 48 hour(s))  Urinalysis, Routine w reflex microscopic     Status: Abnormal   Collection Time: 04/12/17  4:59 PM  Result Value Ref Range   Color, Urine YELLOW YELLOW   APPearance CLEAR CLEAR   Specific Gravity, Urine 1.023 1.005 - 1.030   pH 6.0 5.0 - 8.0   Glucose, UA NEGATIVE NEGATIVE mg/dL   Hgb urine dipstick NEGATIVE NEGATIVE   Bilirubin Urine NEGATIVE NEGATIVE   Ketones, ur NEGATIVE NEGATIVE mg/dL   Protein, ur NEGATIVE NEGATIVE mg/dL   Nitrite NEGATIVE NEGATIVE   Leukocytes, UA TRACE (A) NEGATIVE   RBC / HPF 0-5 0 - 5 RBC/hpf   WBC, UA 0-5 0 - 5 WBC/hpf   Bacteria, UA RARE (A) NONE SEEN   Squamous Epithelial / LPF 0-5 (A) NONE SEEN   Mucous PRESENT   Wet  prep, genital     Status: Abnormal   Collection Time: 04/12/17  6:05 PM  Result Value Ref Range   Yeast Wet Prep HPF POC NONE SEEN NONE SEEN   Trich, Wet Prep NONE SEEN NONE SEEN   Clue Cells Wet Prep HPF POC NONE SEEN NONE SEEN   WBC, Wet Prep HPF POC MODERATE (A) NONE SEEN   Sperm NONE SEEN   Amnisure rupture [facility] membrane (rom)[facility]     Status: None   Collection Time: 04/12/17  6:06 PM  Result Value Ref Range   Amnisure ROM NEGATIVE    Review of Systems  Eyes: Positive for photophobia.  Genitourinary: Negative for dysuria.  Neurological: Positive for headaches.   Physical Exam   Blood pressure 126/82, pulse (!) 113, temperature 98.3 F (36.8 C), temperature source Oral, resp. rate 18, weight 177 lb 0.6 oz (80.3 kg), last menstrual period 12/27/2016, SpO2 99 %.  Physical Exam  Constitutional: She is oriented to person, place, and time. She appears well-developed and well-nourished. No distress.  HENT:  Head: Normocephalic.  Eyes: Pupils are equal, round, and reactive to light.  Respiratory: Effort normal.  Genitourinary:  Genitourinary Comments: Vagina - Small amount of white vaginal discharge, no odor. No pooling of fluid.  Cervix - No contact bleeding, no active bleeding  wet prep done Chaperone present for exam.   Musculoskeletal: Normal range of motion.  Neurological: She is alert and oriented to person, place, and time. No sensory deficit. GCS eye subscore is 4. GCS verbal subscore is 5. GCS motor subscore is 6.  Skin: Skin is warm. She is not diaphoretic.  Psychiatric: Her behavior is normal.   MAU Course  Procedures  None  MDM  + fetal heart tones via doppler.  Flexeril 10 mg PO & 1000 mg PO  Pain down from 8/10 to 4/10 Fern slide negative & amnisure negative   Assessment and Plan   A:  1. Headache in pregnancy, second trimester   2. Seasonal allergic rhinitis, unspecified trigger   3. Vaginal discharge during pregnancy in second trimester      P:  Discharge home in stable condition  Rx: Flexeril, flonase  No sign of rupture of membranes.  Return to MAU if symptoms worsen  May need to see HA specialist.    Duane Lopeasch, Robbi Spells I, NP 04/12/2017 8:26 PM

## 2017-04-12 NOTE — Discharge Instructions (Signed)
Allergies, Adult An allergy is when your body's defense system (immune system) overreacts to an otherwise harmless substance (allergen) that you breathe in or eat or something that touches your skin. When you come into contact with something that you are allergic to, your immune system produces certain proteins (antibodies). These proteins cause cells to release chemicals (histamines) that trigger the symptoms of an allergic reaction. Allergies often affect the nasal passages (allergic rhinitis), eyes (allergic conjunctivitis), skin (atopic dermatitis), and stomach. Allergies can be mild or severe. Allergies cannot spread from person to person (are not contagious). They can develop at any age and may be outgrown. What are the causes? Allergies can be caused by any substance that your immune system mistakenly targets as harmful. These may include:  Outdoor allergens, such as pollen, grass, weeds, car exhaust, and mold spores.  Indoor allergens, such as dust, smoke, mold, and pet dander.  Foods, especially peanuts, milk, eggs, fish, shellfish, soy, nuts, and wheat.  Medicines, such as penicillin.  Skin irritants, such as detergents, chemicals, and latex.  Perfume.  Insect bites or stings. What increases the risk? You may be at greater risk of allergies if other people in your family have allergies. What are the signs or symptoms? Symptoms depend on what type of allergy you have. They may include:  Runny, stuffy nose.  Sneezing.  Itchy mouth, ears, or throat.  Postnasal drip.  Sore throat.  Itchy, red, watery, or puffy eyes.  Skin rash or hives.  Stomach pain.  Vomiting.  Diarrhea.  Bloating.  Wheezing or coughing. People with a severe allergy to food, medicine, or an insect bite may have a life-threatening allergic reaction (anaphylaxis). Symptoms of anaphylaxis include:  Hives.  Itching.  Flushed face.  Swollen lips, tongue, or mouth.  Tight or swollen  throat.  Chest pain or tightness in the chest.  Trouble breathing or shortness of breath.  Rapid heartbeat.  Dizziness or fainting.  Vomiting.  Diarrhea.  Pain in the abdomen. How is this diagnosed? This condition is diagnosed based on:  Your symptoms.  Your family and medical history.  A physical exam. You may need to see a health care provider who specializes in treating allergies (allergist). You may also have tests, including:  Skin tests to see which allergens are causing your symptoms, such as:  Skin prick test. In this test, your skin is pricked with a tiny needle and exposed to small amounts of possible allergens to see if your skin reacts.  Intradermal skin test. In this test, a small amount of allergen is injected under your skin to see if your skin reacts.  Patch test. In this test, a small amount of allergen is placed on your skin and then your skin is covered with a bandage. Your health care provider will check your skin after a couple of days to see if a rash has developed.  Blood tests.  Challenges tests. In this test, you inhale a small amount of allergen by mouth to see if you have an allergic reaction. You may also be asked to:  Keep a food diary. A food diary is a record of all the foods and drinks you have in a day and any symptoms you experience.  Practice an elimination diet. An elimination diet involves eliminating specific foods from your diet and then adding them back in one by one to find out if a certain food causes an allergic reaction. How is this treated? Treatment for allergies depends on your symptoms.   Treatment may include:  Cold compresses to soothe itching and swelling.  Eye drops.  Nasal sprays.  Using a saline spray or container (neti pot) to flush out the nose (nasal irrigation). These methods can help clear away mucus and keep the nasal passages moist.  Using a humidifier.  Oral antihistamines or other medicines to block  allergic reaction and inflammation.  Skin creams to treat rashes or itching.  Diet changes to eliminate food allergy triggers.  Repeated exposure to tiny amounts of allergens to build up a tolerance and prevent future allergic reactions (immunotherapy). These include:  Allergy shots.  Oral treatment. This involves taking small doses of an allergen under the tongue (sublingual immunotherapy).  Emergency epinephrine injection (auto-injector) in case of an allergic emergency. This is a self-injectable, pre-measured medicine that must be given within the first few minutes of a serious allergic reaction. Follow these instructions at home:  Avoid known allergens whenever possible.  If you suffer from airborne allergens, wash out your nose daily. You can do this with a saline spray or a neti pot to flush out your nose (nasal irrigation).  Take over-the-counter and prescription medicines only as told by your health care provider.  Keep all follow-up visits as told by your health care provider. This is important.  If you are at risk of a severe allergic reaction (anaphylaxis), keep your auto-injector with you at all times.  If you have ever had anaphylaxis, wear a medical alert bracelet or necklace that states you have a severe allergy. Contact a health care provider if:  Your symptoms do not improve with treatment. Get help right away if:  You have symptoms of anaphylaxis, such as:  Swollen mouth, tongue, or throat.  Pain or tightness in your chest.  Trouble breathing or shortness of breath.  Dizziness or fainting.  Severe abdominal pain, vomiting, or diarrhea. This information is not intended to replace advice given to you by your health care provider. Make sure you discuss any questions you have with your health care provider. Document Released: 01/31/2003 Document Revised: 07/07/2016 Document Reviewed: 05/25/2016 Elsevier Interactive Patient Education  2017 Elsevier Inc.  

## 2017-04-12 NOTE — MAU Note (Signed)
Leaking fluid (unsure of color) for 2 days; having to wear panty liners.   +migraine that started last night; took tylenol pm this am and has had no relief. Has HX of migraines.  Denies vaginal bleeding.

## 2017-04-13 ENCOUNTER — Encounter: Payer: Self-pay | Admitting: Obstetrics & Gynecology

## 2017-04-13 ENCOUNTER — Ambulatory Visit (INDEPENDENT_AMBULATORY_CARE_PROVIDER_SITE_OTHER): Payer: Medicare Other | Admitting: Obstetrics & Gynecology

## 2017-04-13 VITALS — BP 128/69 | HR 84 | Wt 176.0 lb

## 2017-04-13 DIAGNOSIS — O98212 Gonorrhea complicating pregnancy, second trimester: Secondary | ICD-10-CM

## 2017-04-13 DIAGNOSIS — O10919 Unspecified pre-existing hypertension complicating pregnancy, unspecified trimester: Secondary | ICD-10-CM

## 2017-04-13 DIAGNOSIS — O9989 Other specified diseases and conditions complicating pregnancy, childbirth and the puerperium: Secondary | ICD-10-CM

## 2017-04-13 DIAGNOSIS — M329 Systemic lupus erythematosus, unspecified: Secondary | ICD-10-CM

## 2017-04-13 DIAGNOSIS — O0992 Supervision of high risk pregnancy, unspecified, second trimester: Secondary | ICD-10-CM

## 2017-04-13 DIAGNOSIS — O0990 Supervision of high risk pregnancy, unspecified, unspecified trimester: Secondary | ICD-10-CM

## 2017-04-13 DIAGNOSIS — O10912 Unspecified pre-existing hypertension complicating pregnancy, second trimester: Secondary | ICD-10-CM

## 2017-04-13 MED ORDER — FOLIC ACID 1 MG PO TABS: 1.0000 mg | ORAL_TABLET | Freq: Every day | ORAL | 5 refills | Status: DC

## 2017-04-13 NOTE — Progress Notes (Signed)
   PRENATAL VISIT NOTE  Subjective:  Kayla [name] is a 26 y.o. G3P0110 at 8556w2d being seen today for ongoing prenatal care.  She is currently monitored for the following issues for this high-risk pregnancy and has Interstitial lung disease (HCC); Supervision of high risk pregnancy, antepartum; Systemic lupus erythematosus (SLE) affecting pregnancy, antepartum (HCC); Lupus (systemic lupus erythematosus) (HCC); Loud P2 (pulmonary S2, second heart sound); Insomnia; Other secondary pulmonary hypertension (HCC); Chronic hypertension; Chronic hypertension during pregnancy, antepartum; Sjogren's syndrome (HCC); Chronic Respiratory failure with oxygen requirement affecting pregnancy, antepartum; SS-A antibody positive; SS-B antibody positive; History of fetal loss; and Gonorrhea affecting pregnancy on her problem list.  Patient reports no complaints.  Contractions: Not present. Vag. Bleeding: None.  Movement: Present. Denies leaking of fluid.   The following portions of the patient's history were reviewed and updated as appropriate: allergies, current medications, past family history, past medical history, past social history, past surgical history and problem list. Problem list updated.  Objective:   Vitals:   04/13/17 1056  BP: 128/69  Pulse: 84  Weight: 176 lb (79.8 kg)    Fetal Status:     Movement: Present     General:  Alert, oriented and cooperative. Patient is in no acute distress.  Skin: Skin is warm and dry. No rash noted.   Cardiovascular: Normal heart rate noted  Respiratory: Normal respiratory effort, no problems with respiration noted  Abdomen: Soft, gravid, appropriate for gestational age. Pain/Pressure: Absent     Pelvic:  Cervical exam deferred        Extremities: Normal range of motion.  Edema: None  Mental Status: Normal mood and affect. Normal behavior. Normal judgment and thought content.   Assessment and Plan:  Pregnancy: G3P0110 at 4556w2d  1. Systemic lupus  erythematosus (SLE) affecting pregnancy, antepartum (HCC)  - US MFM OB COMP + 14 WK; Future  2. Supervision of high risk pregnancy, antepartum - [facility], next appt in July - no recent flares  3. Chronic hypertension during pregnancy, antepartum - rec baby asa from now until 36 weeks  4. Gonorrhea affecting pregnancy in second trimester 5. PTL- offered 17 P  Preterm labor symptoms and general obstetric precautions including but not limited to vaginal bleeding, contractions, leaking of fluid and fetal movement were reviewed in detail with the patient. Please refer to After Visit Summary for other counseling recommendations.  No Follow-up on file.   Allie BossierMyra C Dorthey Depace, MD

## 2017-04-20 ENCOUNTER — Telehealth: Payer: Self-pay | Admitting: *Deleted

## 2017-04-20 ENCOUNTER — Ambulatory Visit: Payer: BLUE CROSS/BLUE SHIELD | Admitting: *Deleted

## 2017-04-20 VITALS — BP 117/73 | HR 101

## 2017-04-20 DIAGNOSIS — O09219 Supervision of pregnancy with history of pre-term labor, unspecified trimester: Principal | ICD-10-CM

## 2017-04-20 DIAGNOSIS — O09899 Supervision of other high risk pregnancies, unspecified trimester: Secondary | ICD-10-CM

## 2017-04-20 NOTE — Telephone Encounter (Signed)
I called Darl[facility] back again and left another message I am returning your call again- please call our office so we can get whatever issue resolved so we can get her medication started.

## 2017-04-20 NOTE — Progress Notes (Signed)
Here for 17p shot- explained to her we have not received her medicine yet, that the company called us,  And we have called and left them a message.. Informed her we will call her as soon as we get her medicine in our office and she should call before her next scheduled appointment to make sure we have the medicine.

## 2017-04-20 NOTE — Telephone Encounter (Signed)
[facility] stating they have received communication re: prior auth for [facility]. Asks for a call back.   I called [name] and Darlene unavailable - left a message with another person that I was returning her call, and please call our office.

## 2017-04-24 NOTE — Telephone Encounter (Signed)
[facility] stating that they do see that we have spoken with a care manager but they did confirm with the compounding pharmacy that patient is Medicare D eligible and  It approval was submitted but they cannot follow up  With medicare and they need to know if it was approved. They also need her medicare ID.

## 2017-04-25 ENCOUNTER — Encounter (HOSPITAL_COMMUNITY): Payer: Self-pay

## 2017-04-25 ENCOUNTER — Emergency Department (HOSPITAL_COMMUNITY)
Admission: EM | Admit: 2017-04-25 | Discharge: 2017-04-26 | Disposition: A | Discharge status: Home / Self Care | Payer: Medicare Other | Attending: Emergency Medicine | Admitting: Emergency Medicine

## 2017-04-25 ENCOUNTER — Other Ambulatory Visit: Payer: Self-pay

## 2017-04-25 DIAGNOSIS — Z79899 Other long term (current) drug therapy: Secondary | ICD-10-CM | POA: Diagnosis not present

## 2017-04-25 DIAGNOSIS — R079 Chest pain, unspecified: Secondary | ICD-10-CM

## 2017-04-25 DIAGNOSIS — Z3A17 17 weeks gestation of pregnancy: Secondary | ICD-10-CM | POA: Diagnosis not present

## 2017-04-25 DIAGNOSIS — Z87891 Personal history of nicotine dependence: Secondary | ICD-10-CM | POA: Insufficient documentation

## 2017-04-25 DIAGNOSIS — O9989 Other specified diseases and conditions complicating pregnancy, childbirth and the puerperium: Secondary | ICD-10-CM | POA: Insufficient documentation

## 2017-04-25 DIAGNOSIS — I10 Essential (primary) hypertension: Secondary | ICD-10-CM | POA: Diagnosis not present

## 2017-04-25 DIAGNOSIS — R072 Precordial pain: Secondary | ICD-10-CM | POA: Insufficient documentation

## 2017-04-25 DIAGNOSIS — Z7982 Long term (current) use of aspirin: Secondary | ICD-10-CM | POA: Diagnosis not present

## 2017-04-25 LAB — BASIC METABOLIC PANEL
ANION GAP: 8 (ref 5–15)
BUN: 8 mg/dL (ref 6–20)
CALCIUM: 9.4 mg/dL (ref 8.9–10.3)
CO2: 25 mmol/L (ref 22–32)
Chloride: 102 mmol/L (ref 101–111)
Creatinine, Ser: 0.57 mg/dL (ref 0.44–1.00)
Glucose, Bld: 88 mg/dL (ref 65–99)
Potassium: 3.8 mmol/L (ref 3.5–5.1)
SODIUM: 135 mmol/L (ref 135–145)

## 2017-04-25 LAB — D-DIMER, QUANTITATIVE: D-Dimer, Quant: 0.64 ug/mL-FEU — ABNORMAL HIGH (ref 0.00–0.50)

## 2017-04-25 LAB — CBC
HCT: 34.9 % — ABNORMAL LOW (ref 36.0–46.0)
HEMOGLOBIN: 11.7 g/dL — AB (ref 12.0–15.0)
MCH: 27.3 pg (ref 26.0–34.0)
MCHC: 33.5 g/dL (ref 30.0–36.0)
MCV: 81.5 fL (ref 78.0–100.0)
Platelets: 180 10*3/uL (ref 150–400)
RBC: 4.28 MIL/uL (ref 3.87–5.11)
RDW: 13.1 % (ref 11.5–15.5)
WBC: 5.8 10*3/uL (ref 4.0–10.5)

## 2017-04-25 LAB — I-STAT TROPONIN, ED: TROPONIN I, POC: 0 ng/mL (ref 0.00–0.08)

## 2017-04-25 NOTE — ED Triage Notes (Signed)
Pt presents with sudden onset of mid-sternal chest pain yesterday, pt reports pain is intermittent and does not radiate.  Pt reports shortness of breath, reports dry cough x 2-3 days.  Pt is pregnant x 17 weeks; 3rd pregnancy.

## 2017-04-25 NOTE — ED Provider Notes (Signed)
[facility]-EMERGENCY DEPT Provider Note   CSN: [id number] Arrival date & time: 04/25/17  2014  By signing my name below, I, Rosario Adie, attest that this documentation has been prepared under the direction and in the presence of Omaree Fuqua, Canary Brim, *. Electronically Signed: Rosario Adie, ED Scribe. 04/25/17. 11:14 PM.  History   Chief Complaint Chief Complaint  Patient presents with  . Chest Pain   The history is provided by the patient and medical records. No language interpreter was used.    HPI Comments: Kayla [name] is a 26 y.o. female who is [redacted]w[redacted]d pregnant, with a PMHx of GERD, HTN, SLE, pulmonary HTN, and interstitial lung disease, who presents to the Emergency Department complaining of intermittent episodes of pressure like upper chest pain beginning last night. She notes associated shortness of breath and non-productive cough accompanying her episodes of pain. Per pt, her pain will usually last for several hours each time before spontaneously resolving. Her pain is mildly exacerbated with deep inspirations. No noted treatments for her pain were tried prior to coming into the ED. Pt notes that her pain today is similar to her pain she has experienced with Sjogren's syndrome and lupus, but somewhat different. No h/o PE/DVT or prior cardiac events. She is currently prescribed 10mg  q.d of Prednisone. Of note, pt had an echocardiogram performed on 02/28/17 and she reports that her imaging at that time was unremarkable. She denies diaphoresis, or any other associated symptoms.   Past Medical History:  Diagnosis Date  . Arthritis    "hands and legs" (01/08/2015)  . CAP (community acquired pneumonia) 01/07/2015  . Daily headache    "sometimes" (01/08/2015)  . GERD (gastroesophageal reflux disease)   . Hypertension   . Lung disease   . Lupus   . Pulmonary hypertension (HCC)   . Sjogren's syndrome [facility])    Patient Active Problem List   Diagnosis Date Noted  . Gonorrhea  affecting pregnancy 02/17/2017  . History of fetal loss 02/14/2017  . 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 Chronic Respiratory failure with oxygen requirement affecting pregnancy, antepartum 05/16/2016  . Chronic hypertension during pregnancy, antepartum 04/14/2016  . Chronic hypertension 03/14/2016  . 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 Lupus (systemic lupus erythematosus) (HCC) 08/21/2015  . Loud P2 (pulmonary S2, second heart sound) 08/21/2015  . Insomnia 08/21/2015  . Supervision of high risk pregnancy, antepartum 07/06/2015  . Systemic lupus erythematosus (SLE) affecting pregnancy, antepartum (HCC) 07/06/2015  . Sjogren's syndrome (HCC) 04/28/2015  . Interstitial lung disease (HCC) 02/06/2015   Past Surgical History:  Procedure Laterality Date  . CARDIAC CATHETERIZATION N/A 09/09/2015   Procedure: Right Heart Cath;  Surgeon: Laurey Morale, MD;  Location: [facility] INVASIVE CV LAB;  Service: Cardiovascular;  Laterality: N/A;  . DILATION AND EVACUATION N/A 07/13/2015   Procedure: DILATATION AND EVACUATION;  Surgeon: Levie Heritage, DO;  Location: [facility] ORS;  Service: Gynecology;  Laterality: N/A;  . FINGER SURGERY Right 03/2014   "laceration, nerve/artery injury" 2nd digit  . VIDEO BRONCHOSCOPY Bilateral 01/12/2015   Procedure: VIDEO BRONCHOSCOPY WITH FLUORO;  Surgeon: Leslye Peer, MD;  Location: [facility] ENDOSCOPY;  Service: Cardiopulmonary;  Laterality: Bilateral;   OB History    Gravida Para Term Preterm AB Living   3 1 0 1 1 0   SAB TAB Ectopic Multiple Live Births   1 0 0 0 0     Home Medications    Prior to Admission  medications   Medication Sig Start Date End Date Taking? Authorizing Provider  aspirin EC 81 MG tablet Take 1 tablet (81 mg total) by mouth daily. 03/13/17  Yes Lesly DukesLeggett, Kelly H, MD  fluticasone (FLONASE) 50 MCG/ACT nasal spray Place 2 sprays into both nostrils daily. 04/12/17  Yes Rasch, Harolyn RutherfordJennifer I, NP  folic  acid (FOLVITE) 1 MG tablet Take 1 tablet (1 mg total) by mouth daily. 04/13/17  Yes Dove, Myra C, MD  hydroxychloroquine (PLAQUENIL) 200 MG tablet Take 200 mg by mouth daily.  03/15/17  Yes [provider]  predniSONE (DELTASONE) 5 MG tablet Take 10 mg by mouth daily. 01/30/17 01/30/18 Yes [provider]  Prenatal Vit-Fe Fumarate-FA (PRENATAL COMPLETE) 14-0.4 MG TABS Take 1 tablet by mouth daily. 01/30/17  Yes Shaune PollackIsaacs, Cameron, MD  cyclobenzaprine (FLEXERIL) 10 MG tablet Take 1 tablet (10 mg total) by mouth 2 (two) times daily as needed for muscle spasms. Patient not taking: Reported on 04/25/2017 04/12/17   Rasch, Victorino DikeJennifer I, NP  predniSONE (DELTASONE) 20 MG tablet 3 tabs po daily x 3 days, then 2.5 tabs x 3 days, then 2 tabs x 3 days, then 1 tab x 3 days, then normal dose 04/26/17   Ryenn Howeth, Canary Brimhristopher J, MD   Family History Family History  Problem Relation Age of Onset  . Diabetes Mother   . Diabetes Maternal Aunt   . Diabetes Maternal Grandmother    Social History Social History  Substance Use Topics  . Smoking status: Former Smoker    Packs/day: 0.10    Years: 5.00    Types: Cigarettes    Quit date: 11/20/2014  . Smokeless tobacco: Former NeurosurgeonUser  . Alcohol use No     Comment: 01/08/2015 "last drink was New Year's Eve"   Allergies   Hydrocodone and Zithromax [azithromycin]  Review of Systems Review of Systems  Constitutional: Negative for diaphoresis.  Respiratory: Positive for cough and shortness of breath.   Cardiovascular: Positive for chest pain.  All other systems reviewed and are negative.  Physical Exam Updated Vital Signs BP 120/86   Pulse 93   Temp 99.8 F (37.7 C) (Oral)   Resp (!) 22   Ht 5\' 3"  (1.6 m)   Wt 80.7 kg (178 lb)   LMP 12/27/2016   SpO2 96%   BMI 31.53 kg/m   Physical Exam  Constitutional: She is oriented to person, place, and time. She appears well-developed and well-nourished. No distress.  HENT:  Head: Normocephalic and  atraumatic.  Right Ear: Hearing normal.  Left Ear: Hearing normal.  Nose: Nose normal.  Mouth/Throat: Oropharynx is clear and moist and mucous membranes are normal.  Eyes: Conjunctivae and EOM are normal. Pupils are equal, round, and reactive to light.  Neck: Normal range of motion. Neck supple.  Cardiovascular: Regular rhythm, S1 normal and S2 normal.  Exam reveals no gallop and no friction rub.   No murmur heard. Pulmonary/Chest: Effort normal and breath sounds normal. No respiratory distress. She exhibits no tenderness.  Abdominal: Soft. Normal appearance and bowel sounds are normal. There is no hepatosplenomegaly. There is no tenderness. There is no rebound, no guarding, no tenderness at McBurney's point and negative Murphy's sign. No hernia.  Musculoskeletal: Normal range of motion.  Neurological: She is alert and oriented to person, place, and time. She has normal strength. No cranial nerve deficit or sensory deficit. Coordination normal. GCS eye subscore is 4. GCS verbal subscore is 5. GCS motor subscore is 6.  Skin: Skin is  warm, dry and intact. No rash noted. No cyanosis.  Psychiatric: She has a normal mood and affect. Her speech is normal and behavior is normal. Thought content normal.  Nursing note and vitals reviewed.  ED Treatments / Results  DIAGNOSTIC STUDIES: Oxygen Saturation is 100% on RA, normal by my interpretation.   COORDINATION OF CARE: 11:14 PM-Discussed next steps with pt. Pt verbalized understanding and is agreeable with the plan.   Labs (all labs ordered are listed, but only abnormal results are displayed) Labs Reviewed  CBC - Abnormal; Notable for the following:       Result Value   Hemoglobin 11.7 (*)    HCT 34.9 (*)    All other components within normal limits  D-DIMER, QUANTITATIVE (NOT AT [facility]) - Abnormal; Notable for the following:    D-Dimer, Quant 0.64 (*)    All other components within normal limits  BASIC METABOLIC PANEL  I-STAT TROPOININ, ED    EKG  EKG Interpretation None      Radiology No results found.  Procedures Procedures   Medications Ordered in ED Medications - No data to display  Initial Impression / Assessment and Plan / ED Course  I have reviewed the triage vital signs and the nursing notes.  Pertinent labs & imaging results that were available during my care of the patient were reviewed by me and considered in my medical decision making (see chart for details).     Patient presents to the records. For evaluation of intermittent episodes of chest pain since yesterday. Episodes last 1-2 hours and then resolved. No exertional component. She does report shortness of breath associated with the symptoms and has had some nonproductive coughing. Patient does have a history of Sjogren's involving lung as well as lupus. She is [redacted] weeks pregnant, no concerns for obstetric complications. She has not had any abdominal or pelvic pain, vaginal bleeding. She has been receiving prenatal care.  Symptoms are likely secondary to her chronic interstitial lung disease. She is, however, pregnant and has lupus, therefore PE was considered. A d-dimer was mildly elevated. Based on this I recommended CT angiography to rule out PE. Patient has declined. I did extensively discuss with her the risks and benefits of the study. Risks of radiation exposure to her as well as her fetus were discussed. She also was informed of the significant risk associated with missing PE, including permanent sterility and death. She has been informed and has declined the procedure. She was counseled to return to the ER immediately for worsening symptoms. She'll be treated with a prednisone taper for presumed interstitial lung disease causing her symptoms. She takes prednisone 10 mg daily. Will start at 60 and taper back down to her normal dose.  Final Clinical Impressions(s) / ED Diagnoses   Final diagnoses:  Chest pain, unspecified type   New  Prescriptions New Prescriptions   PREDNISONE (DELTASONE) 20 MG TABLET    3 tabs po daily x 3 days, then 2.5 tabs x 3 days, then 2 tabs x 3 days, then 1 tab x 3 days, then normal dose   I personally performed the services described in this documentation, which was scribed in my presence. The recorded information has been reviewed and is accurate.     Gilda Crease, MD 04/26/17 [phone]

## 2017-04-26 ENCOUNTER — Telehealth: Payer: Self-pay | Admitting: *Deleted

## 2017-04-26 MED ORDER — PREDNISONE 20 MG PO TABS: ORAL_TABLET | ORAL | 0 refills | Status: DC

## 2017-04-26 NOTE — ED Notes (Signed)
Pt states that she wants to sign herself out. Pt states that she's been here since 8pm and that she is hungry and tired.

## 2017-04-26 NOTE — Telephone Encounter (Signed)
I called again and Kayla [name] unavailable- I requested to speak with any care manager and explained we have sent in pre approval and my understanding is once that is sent in - we do not get a note showing approval but your pharmacy should be able to run  Med thru and show approval. I also gave Kayla [name]'s medicare number as requested- [id number]. I asked that we try to get this started asap as Kayla [name] is now 17 weeks and to please let us know if there are any other issues- so that we can assist in getting it started.

## 2017-04-26 NOTE — Telephone Encounter (Signed)
I called Kayla [name] to update her that we still have not had not received her 17p- call first tomorrow to see if it has arrived before she comes.  I also informed her [facility] straight[facility] out some issues. I informed her hopefully we will get her medicine soon. She voices understanding.

## 2017-04-27 ENCOUNTER — Encounter (HOSPITAL_COMMUNITY): Payer: Self-pay | Admitting: *Deleted

## 2017-04-27 ENCOUNTER — Ambulatory Visit: Payer: Medicare Other

## 2017-04-27 ENCOUNTER — Inpatient Hospital Stay (HOSPITAL_COMMUNITY)
Admission: AD | Admit: 2017-04-27 | Discharge: 2017-04-27 | Disposition: A | Discharge status: Home / Self Care | Payer: Medicare Other | Source: Ambulatory Visit | Attending: Family Medicine | Admitting: Family Medicine

## 2017-04-27 DIAGNOSIS — R8299 Other abnormal findings in urine: Secondary | ICD-10-CM | POA: Insufficient documentation

## 2017-04-27 DIAGNOSIS — M329 Systemic lupus erythematosus, unspecified: Secondary | ICD-10-CM | POA: Insufficient documentation

## 2017-04-27 DIAGNOSIS — Z881 Allergy status to other antibiotic agents status: Secondary | ICD-10-CM | POA: Diagnosis not present

## 2017-04-27 DIAGNOSIS — L298 Other pruritus: Secondary | ICD-10-CM | POA: Diagnosis not present

## 2017-04-27 DIAGNOSIS — O99612 Diseases of the digestive system complicating pregnancy, second trimester: Secondary | ICD-10-CM | POA: Insufficient documentation

## 2017-04-27 DIAGNOSIS — M3500 Sicca syndrome, unspecified: Secondary | ICD-10-CM | POA: Diagnosis not present

## 2017-04-27 DIAGNOSIS — O99412 Diseases of the circulatory system complicating pregnancy, second trimester: Secondary | ICD-10-CM | POA: Insufficient documentation

## 2017-04-27 DIAGNOSIS — O162 Unspecified maternal hypertension, second trimester: Secondary | ICD-10-CM | POA: Diagnosis not present

## 2017-04-27 DIAGNOSIS — Z3A17 17 weeks gestation of pregnancy: Secondary | ICD-10-CM | POA: Insufficient documentation

## 2017-04-27 DIAGNOSIS — Z7982 Long term (current) use of aspirin: Secondary | ICD-10-CM | POA: Diagnosis not present

## 2017-04-27 DIAGNOSIS — K219 Gastro-esophageal reflux disease without esophagitis: Secondary | ICD-10-CM | POA: Diagnosis not present

## 2017-04-27 DIAGNOSIS — Z87891 Personal history of nicotine dependence: Secondary | ICD-10-CM | POA: Insufficient documentation

## 2017-04-27 DIAGNOSIS — O99112 Other diseases of the blood and blood-forming organs and certain disorders involving the immune mechanism complicating pregnancy, second trimester: Secondary | ICD-10-CM | POA: Insufficient documentation

## 2017-04-27 DIAGNOSIS — Z9889 Other specified postprocedural states: Secondary | ICD-10-CM | POA: Insufficient documentation

## 2017-04-27 DIAGNOSIS — Z885 Allergy status to narcotic agent status: Secondary | ICD-10-CM | POA: Diagnosis not present

## 2017-04-27 DIAGNOSIS — Z833 Family history of diabetes mellitus: Secondary | ICD-10-CM | POA: Diagnosis not present

## 2017-04-27 DIAGNOSIS — O26892 Other specified pregnancy related conditions, second trimester: Secondary | ICD-10-CM | POA: Diagnosis not present

## 2017-04-27 DIAGNOSIS — N898 Other specified noninflammatory disorders of vagina: Secondary | ICD-10-CM | POA: Diagnosis present

## 2017-04-27 DIAGNOSIS — O9989 Other specified diseases and conditions complicating pregnancy, childbirth and the puerperium: Secondary | ICD-10-CM | POA: Diagnosis not present

## 2017-04-27 DIAGNOSIS — E860 Dehydration: Secondary | ICD-10-CM

## 2017-04-27 LAB — URINALYSIS, ROUTINE W REFLEX MICROSCOPIC
BILIRUBIN URINE: NEGATIVE
GLUCOSE, UA: NEGATIVE mg/dL
Hgb urine dipstick: NEGATIVE
KETONES UR: 5 mg/dL — AB
LEUKOCYTES UA: NEGATIVE
Nitrite: NEGATIVE
PH: 6 (ref 5.0–8.0)
PROTEIN: 30 mg/dL — AB
Specific Gravity, Urine: 1.025 (ref 1.005–1.030)

## 2017-04-27 LAB — WET PREP, GENITAL
Clue Cells Wet Prep HPF POC: NONE SEEN
Sperm: NONE SEEN
TRICH WET PREP: NONE SEEN
Yeast Wet Prep HPF POC: NONE SEEN

## 2017-04-27 MED ORDER — FLUCONAZOLE 150 MG PO TABS: 150.0000 mg | ORAL_TABLET | Freq: Once | ORAL | 3 refills | Status: AC

## 2017-04-27 NOTE — MAU Note (Signed)
Pt reports she has had strong smelling urine for the past 3 days, deneis burning  . C/o vaginal itching as well.

## 2017-04-27 NOTE — MAU Provider Note (Signed)
Chief Complaint: UTI   First Provider Initiated Contact with Patient 04/27/17 1700       HPI: Kayla [name] is a 26 y.o. G3P0110 at [redacted]w[redacted]d who presents to maternity admissions reporting foul smelling/strong smelling urine and vaginal itching. Itching started yesterday, but is not severe. Denies flank pain or dysuria.  . She reports no vaginal bleeding, vaginal itching/burning, urinary symptoms, h/a, dizziness, n/v, or fever/chills.    Urinary Tract Infection   This is a new problem. The current episode started yesterday. The problem occurs every urination. The problem has been unchanged. Quality: no pain, only strong smell to urine. The patient is experiencing no pain. There has been no fever. There is no history of pyelonephritis. Pertinent negatives include no chills, discharge, flank pain, frequency, hematuria, hesitancy, nausea, urgency or vomiting. She has tried nothing for the symptoms.  Vaginal Itching  The patient's primary symptoms include genital itching. The patient's pertinent negatives include no genital lesions, genital odor, pelvic pain, vaginal bleeding or vaginal discharge. This is a new problem. The current episode started yesterday. The problem occurs intermittently. The problem has been unchanged. The patient is experiencing no pain. The problem affects both sides. She is pregnant. Pertinent negatives include no abdominal pain, back pain, chills, flank pain, frequency, hematuria, nausea, urgency or vomiting. Nothing aggravates the symptoms. She has tried nothing for the symptoms.    RN Note: G3 @ 17.[redacted] wksga. Presents to triage for foul smelling urine for past 3 day along with vaginal itching. Denies LOF or bleeing. + fluttter movement. VSS see flow sheet for details.   Doppler: 172  Past Medical History: Past Medical History:  Diagnosis Date  . Arthritis    "hands and legs" (01/08/2015)  . CAP (community acquired pneumonia) 01/07/2015  . Daily headache    "sometimes"  (01/08/2015)  . GERD (gastroesophageal reflux disease)   . Hypertension   . Lung disease   . Lupus   . Pulmonary hypertension (HCC)   . Sjogren's syndrome (HCC)     Past obstetric history: OB History  Gravida Para Term Preterm AB Living  3 1 0 1 1 0  SAB TAB Ectopic Multiple Live Births  1 0 0 0 0    # Outcome Date GA Lbr Len/2nd Weight Sex Delivery Anes PTL Lv  3 Current           2 Preterm 06/2015 [redacted]w[redacted]d   F    FD  1 SAB  [redacted]w[redacted]d    SAB         Past Surgical History: Past Surgical History:  Procedure Laterality Date  . CARDIAC CATHETERIZATION N/A 09/09/2015   Procedure: Right Heart Cath;  Surgeon: Laurey Morale, MD;  Location: [facility] INVASIVE CV LAB;  Service: Cardiovascular;  Laterality: N/A;  . DILATION AND EVACUATION N/A 07/13/2015   Procedure: DILATATION AND EVACUATION;  Surgeon: Levie Heritage, DO;  Location: [facility] ORS;  Service: Gynecology;  Laterality: N/A;  . FINGER SURGERY Right 03/2014   "laceration, nerve/artery injury" 2nd digit  . VIDEO BRONCHOSCOPY Bilateral 01/12/2015   Procedure: VIDEO BRONCHOSCOPY WITH FLUORO;  Surgeon: Leslye Peer, MD;  Location: [facility] ENDOSCOPY;  Service: Cardiopulmonary;  Laterality: Bilateral;    Family History: Family History  Problem Relation Age of Onset  . Diabetes Mother   . Diabetes Maternal Aunt   . Diabetes Maternal Grandmother     Social History: Social History  Substance Use Topics  . Smoking status: Former Smoker    Packs/day: 0.10  Years: 5.00    Types: Cigarettes    Quit date: 11/20/2014  . Smokeless tobacco: Former Neurosurgeon  . Alcohol use No     Comment: 01/08/2015 "last drink was New Year's Eve"    Allergies:  Allergies  Allergen Reactions  . Hydrocodone Nausea And Vomiting  . Zithromax [Azithromycin] Itching and Cough    Meds:  Prescriptions Prior to Admission  Medication Sig Dispense Refill Last Dose  . aspirin EC 81 MG tablet Take 1 tablet (81 mg total) by mouth daily. 30 tablet 2 04/25/2017 at Unknown time   . cyclobenzaprine (FLEXERIL) 10 MG tablet Take 1 tablet (10 mg total) by mouth 2 (two) times daily as needed for muscle spasms. (Patient not taking: Reported on 04/25/2017) 20 tablet 0 Completed Course at Unknown time  . fluticasone (FLONASE) 50 MCG/ACT nasal spray Place 2 sprays into both nostrils daily. 16 g 2 04/25/2017 at Unknown time  . folic acid (FOLVITE) 1 MG tablet Take 1 tablet (1 mg total) by mouth daily. 100 tablet 5 04/25/2017 at Unknown time  . hydroxychloroquine (PLAQUENIL) 200 MG tablet Take 200 mg by mouth daily.   10 04/25/2017 at Unknown time  . predniSONE (DELTASONE) 20 MG tablet 3 tabs po daily x 3 days, then 2.5 tabs x 3 days, then 2 tabs x 3 days, then 1 tab x 3 days, then normal dose 26 tablet 0   . predniSONE (DELTASONE) 5 MG tablet Take 10 mg by mouth daily.   04/25/2017 at Unknown time  . Prenatal Vit-Fe Fumarate-FA (PRENATAL COMPLETE) 14-0.4 MG TABS Take 1 tablet by mouth daily. 30 each 0 04/25/2017 at Unknown time    I have reviewed patient's Past Medical Hx, Surgical Hx, Family Hx, Social Hx, medications and allergies.  ROS:  Review of Systems  Constitutional: Negative for chills.  Gastrointestinal: Negative for abdominal pain, nausea and vomiting.  Genitourinary: Negative for flank pain, frequency, hematuria, hesitancy, pelvic pain, urgency and vaginal discharge.  Musculoskeletal: Negative for back pain.   Other systems negative     Physical Exam  Patient Vitals for the past 24 hrs:  BP Temp Temp src Pulse Resp Height Weight  04/27/17 1611 114/74 99 F (37.2 C) Oral (!) 109 18 5\' 3"  (1.6 m) 178 lb (80.7 kg)   Constitutional: Well-developed, well-nourished female in no acute distress.  Cardiovascular: normal rate and rhythm, no ectopy audible, S1 & S2 heard, no murmur Respiratory: normal effort, no distress. Lungs CTAB with no wheezes or crackles GI: Abd soft, non-tender.  Nondistended.  No rebound, No guarding.  Bowel Sounds audible  MS: Extremities nontender, no  edema, normal ROM Neurologic: Alert and oriented x 4.   Grossly nonfocal. GU: Neg CVAT. Skin:  Warm and Dry Psych:  Affect appropriate.  PELVIC EXAM: Cervix pink, visually closed, without lesion, scant white creamy discharge, vaginal walls and external genitalia normal Bimanual exam: Cervix firm, anterior, neg CMT, uterus nontender, nonenlarged, adnexa without tenderness, enlargement, or mass    Labs: Results for orders placed or performed during the hospital encounter of 04/27/17 (from the past 24 hour(s))  Urinalysis, Routine w reflex microscopic     Status: Abnormal   Collection Time: 04/27/17  4:05 PM  Result Value Ref Range   Color, Urine AMBER (A) YELLOW   APPearance HAZY (A) CLEAR   Specific Gravity, Urine 1.025 1.005 - 1.030   pH 6.0 5.0 - 8.0   Glucose, UA NEGATIVE NEGATIVE mg/dL   Hgb urine dipstick NEGATIVE NEGATIVE  Bilirubin Urine NEGATIVE NEGATIVE   Ketones, ur 5 (A) NEGATIVE mg/dL   Protein, ur 30 (A) NEGATIVE mg/dL   Nitrite NEGATIVE NEGATIVE   Leukocytes, UA NEGATIVE NEGATIVE   RBC / HPF 0-5 0 - 5 RBC/hpf   WBC, UA 0-5 0 - 5 WBC/hpf   Bacteria, UA RARE (A) NONE SEEN   Squamous Epithelial / LPF 0-5 (A) NONE SEEN   Mucous PRESENT   Wet prep, genital     Status: Abnormal   Collection Time: 04/27/17  5:10 PM  Result Value Ref Range   Yeast Wet Prep HPF POC NONE SEEN NONE SEEN   Trich, Wet Prep NONE SEEN NONE SEEN   Clue Cells Wet Prep HPF POC NONE SEEN NONE SEEN   WBC, Wet Prep HPF POC FEW (A) NONE SEEN   Sperm NONE SEEN       Imaging:  No results found.  MAU Course/MDM: I have ordered labs as follows: UA and Wet Prep Imaging ordered:  Results reviewed. UA is negative for bacteria.  Wet prep negative but will offer treatment based on symptoms.  Treatments in MAU included none.   Pt stable at time of discharge.  Assessment: 1. Vagina itching   2.     Strong odor to urine, likely concentration.  Plan: Discharge home Recommend Push PO fluids so  that urine is almost clear, not very yellow.  Avoid caffeine/sodas Rx sent for Diflucan for vaginal itching  Use only if itching persists.  Follow-up Information    [facility], [facility].   Contact information: [address] [place] [id number] [phone]           Encouraged to return here or to other Urgent Care/ED if she develops worsening of symptoms, increase in pain, fever, or other concerning symptoms.   Wynelle BourgeoisMarie Thais Silberstein CNM, MSN Certified Nurse-Midwife 04/27/2017 6:38 PM

## 2017-04-27 NOTE — Progress Notes (Addendum)
Pt came in today for 17p.  17p is currently not here.  Looking in pt's chart, pt spoke with Bonita QuinLinda, RN and was informed to call before she comes to make sure we have her injection.  When pt asked pt informed me that she had called and stated that she spoke with a man who informed her that the medication is here.  I explained to the pt that I apologize I am not sure who she spoke with but her medication is not here but I will call [name] and f/u with her.  Pt stated understanding.

## 2017-04-27 NOTE — Discharge Instructions (Signed)

## 2017-04-27 NOTE — Progress Notes (Addendum)
G3 @ 17.[redacted] wksga. Presents to triage for foul smelling urine for past 3 day along with vaginal itching. Denies LOF or bleeing. + fluttter movement. VSS see flow sheet for details.   Doppler: 172  1700: Provider at bs assessing pt. Pet prep and vaginal exam done. Closed.

## 2017-04-28 ENCOUNTER — Telehealth: Payer: Self-pay | Admitting: General Practice

## 2017-04-28 NOTE — Telephone Encounter (Signed)
[facility] stating [facility] are still waiting to hear back about prior auth on 17p and wanted to know if we knew anything. Told her we did not. She referenced to the auto injectors taking a while to prior auth & I provided verbal order to switch to IM vials for patient to be sent out asap. She verbalized understanding and states she will make that change now.

## 2017-05-01 NOTE — Progress Notes (Signed)
Contacted [name] and spoke with Sharen Counterrish, [name] representative,  in regards to pt receiving IM medication.  Per Rosann Auerbachrish, pt has not received [name] due to prior auth.  I explained to her that a prior auth was faxed along with the pt's original Rx on 04/13/17 and that Derlim spoke with one of our nurses who was to change the route of medication from Auto-injector to IM.  Trish informed me that she saw the prior authorization for the auto-injector but looks like it was not submitted on [name]'s end.  Trish informed me that she can send the order with prior auth for the auto injector or did we want the IM.  Trish also informed me that an emergency medication will have to be sent via auto-injector because the IM is on backorder and will take longer for the emergency IM vial to arrive.  I informed Rosann Auerbachrish so that we can get the pt started if she could send the auto injector as emergency medication and then after to please send IM vials.  Rosann Auerbachrish stated understanding and that she will let Derlim, ext [phone], know and Derlim should be contacting us with follow up. I informed Rosann Auerbachrish that she can speak to any clinical staff in regards to pt.  Trish stated understanding.

## 2017-05-02 ENCOUNTER — Telehealth: Payer: Self-pay | Admitting: *Deleted

## 2017-05-02 NOTE — Telephone Encounter (Signed)
[facility] yesterday [facility] problem with insurance verification. She needs to know pt's ID # for BC/BS and also which insurance is primary - Medicare, Medicaid or BC/BS. I returned the call and provided info of BC/BS ID# as well as stating that BC/BS is primary.  She can now complete the request for 17P and we will receive a call from [facility] to schedule shipment of medication.

## 2017-05-04 ENCOUNTER — Telehealth: Payer: Self-pay | Admitting: *Deleted

## 2017-05-04 ENCOUNTER — Ambulatory Visit: Payer: Self-pay

## 2017-05-04 NOTE — Telephone Encounter (Signed)
Spoke w/representative @ 3pm today from [facility] regarding [employer] [name]. She has been approved for 2 doses of the auto injector. I explained that pt has been prescribed the IM injectable form of medication. She advised that there will be a shortage of the single dose vials in about 5 weeks. I then inquired about the multi-dose vial and she stated she will check into it and call us back. Per chart review, pt had appt for this afternoon to receive injection. I called and notified her that we have not yet received her medication.  Pt was aware of some of the insurance problems and had been contacted earlier today by the pharmacy as well. She understands that she will be contacted as soon as her medication is available for administration.

## 2017-05-04 NOTE — Telephone Encounter (Signed)
Received message on nurse voicemail on 05/03/17 at 1723.  Sarah calling from the pharmacy which will dispense [name].  Requests a call back to set up delivery schedule.  Requests call to [phone].

## 2017-05-08 ENCOUNTER — Telehealth: Payer: Self-pay | Admitting: *Deleted

## 2017-05-08 NOTE — Telephone Encounter (Signed)
Received a voicemail from 05/05/17 From Lupita Leashonna at [facility] stating she is calling to confirm intake and to get verbal rx and scheduled shipment. I called [facility] and they can only send a 5ml bottle for 5 injections and needed verbal order to do that. I gave verbal order and confirmed shipment information- should be delivered 05/10/17 .

## 2017-05-10 ENCOUNTER — Encounter: Payer: Self-pay | Admitting: Obstetrics and Gynecology

## 2017-05-11 ENCOUNTER — Other Ambulatory Visit: Payer: Self-pay

## 2017-05-11 DIAGNOSIS — Z3689 Encounter for other specified antenatal screening: Secondary | ICD-10-CM

## 2017-05-15 ENCOUNTER — Inpatient Hospital Stay (HOSPITAL_COMMUNITY)
Admission: AD | Admit: 2017-05-15 | Discharge: 2017-05-15 | Disposition: A | Discharge status: Home / Self Care | Payer: Medicare Other | Source: Ambulatory Visit | Attending: Obstetrics and Gynecology | Admitting: Obstetrics and Gynecology

## 2017-05-15 ENCOUNTER — Encounter (HOSPITAL_COMMUNITY): Payer: Self-pay

## 2017-05-15 DIAGNOSIS — M3500 Sicca syndrome, unspecified: Secondary | ICD-10-CM | POA: Insufficient documentation

## 2017-05-15 DIAGNOSIS — M1990 Unspecified osteoarthritis, unspecified site: Secondary | ICD-10-CM | POA: Insufficient documentation

## 2017-05-15 DIAGNOSIS — Z3A19 19 weeks gestation of pregnancy: Secondary | ICD-10-CM | POA: Diagnosis not present

## 2017-05-15 DIAGNOSIS — N760 Acute vaginitis: Secondary | ICD-10-CM

## 2017-05-15 DIAGNOSIS — Z885 Allergy status to narcotic agent status: Secondary | ICD-10-CM | POA: Diagnosis not present

## 2017-05-15 DIAGNOSIS — Z79899 Other long term (current) drug therapy: Secondary | ICD-10-CM | POA: Diagnosis not present

## 2017-05-15 DIAGNOSIS — O2342 Unspecified infection of urinary tract in pregnancy, second trimester: Secondary | ICD-10-CM | POA: Diagnosis not present

## 2017-05-15 DIAGNOSIS — Z881 Allergy status to other antibiotic agents status: Secondary | ICD-10-CM | POA: Insufficient documentation

## 2017-05-15 DIAGNOSIS — O23592 Infection of other part of genital tract in pregnancy, second trimester: Secondary | ICD-10-CM | POA: Diagnosis not present

## 2017-05-15 DIAGNOSIS — Z87891 Personal history of nicotine dependence: Secondary | ICD-10-CM | POA: Insufficient documentation

## 2017-05-15 DIAGNOSIS — O9989 Other specified diseases and conditions complicating pregnancy, childbirth and the puerperium: Secondary | ICD-10-CM | POA: Insufficient documentation

## 2017-05-15 DIAGNOSIS — B9689 Other specified bacterial agents as the cause of diseases classified elsewhere: Secondary | ICD-10-CM | POA: Diagnosis not present

## 2017-05-15 DIAGNOSIS — N898 Other specified noninflammatory disorders of vagina: Secondary | ICD-10-CM | POA: Diagnosis present

## 2017-05-15 LAB — WET PREP, GENITAL
Sperm: NONE SEEN
Trich, Wet Prep: NONE SEEN
YEAST WET PREP: NONE SEEN

## 2017-05-15 LAB — URINALYSIS, ROUTINE W REFLEX MICROSCOPIC
BILIRUBIN URINE: NEGATIVE
Glucose, UA: NEGATIVE mg/dL
Hgb urine dipstick: NEGATIVE
KETONES UR: NEGATIVE mg/dL
Nitrite: POSITIVE — AB
Protein, ur: 30 mg/dL — AB
Specific Gravity, Urine: 1.019 (ref 1.005–1.030)
pH: 7 (ref 5.0–8.0)

## 2017-05-15 MED ORDER — NITROFURANTOIN MONOHYD MACRO 100 MG PO CAPS: 100.0000 mg | ORAL_CAPSULE | Freq: Two times a day (BID) | ORAL | 0 refills | Status: DC

## 2017-05-15 MED ORDER — METRONIDAZOLE 500 MG PO TABS: 500.0000 mg | ORAL_TABLET | Freq: Two times a day (BID) | ORAL | 0 refills | Status: AC

## 2017-05-15 NOTE — MAU Note (Signed)
Started having yellowish discharge beginning 6/22 with a foul odor and some itching.  States she was here a few weeks ago with suspicion of a yeast infection but it came back negative.

## 2017-05-15 NOTE — Discharge Instructions (Signed)
Bacterial Vaginosis Bacterial vaginosis is a vaginal infection that occurs when the normal balance of bacteria in the vagina is disrupted. It results from an overgrowth of certain bacteria. This is the most common vaginal infection among women ages 7-44. Because bacterial vaginosis increases your risk for STIs (sexually transmitted infections), getting treated can help reduce your risk for chlamydia, gonorrhea, herpes, and HIV (human immunodeficiency virus). Treatment is also important for preventing complications in pregnant women, because this condition can cause an early (premature) delivery. What are the causes? This condition is caused by an increase in harmful bacteria that are normally present in small amounts in the vagina. However, the reason that the condition develops is not fully understood. What increases the risk? The following factors may make you more likely to develop this condition:  Having a new sexual partner or multiple sexual partners.  Having unprotected sex.  Douching.  Having an intrauterine device (IUD).  Smoking.  Drug and alcohol abuse.  Taking certain antibiotic medicines.  Being pregnant.  You cannot get bacterial vaginosis from toilet seats, bedding, swimming pools, or contact with objects around you. What are the signs or symptoms? Symptoms of this condition include:  Grey or white vaginal discharge. The discharge can also be watery or foamy.  A fish-like odor with discharge, especially after sexual intercourse or during menstruation.  Itching in and around the vagina.  Burning or pain with urination.  Some women with bacterial vaginosis have no signs or symptoms. How is this diagnosed? This condition is diagnosed based on:  Your medical history.  A physical exam of the vagina.  Testing a sample of vaginal fluid under a microscope to look for a large amount of bad bacteria or abnormal cells. Your health care provider may use a cotton swab  or a small wooden spatula to collect the sample.  How is this treated? This condition is treated with antibiotics. These may be given as a pill, a vaginal cream, or a medicine that is put into the vagina (suppository). If the condition comes back after treatment, a second round of antibiotics may be needed. Follow these instructions at home: Medicines  Take over-the-counter and prescription medicines only as told by your health care provider.  Take or use your antibiotic as told by your health care provider. Do not stop taking or using the antibiotic even if you start to feel better. General instructions  If you have a female sexual partner, tell her that you have a vaginal infection. She should see her health care provider and be treated if she has symptoms. If you have a female sexual partner, he does not need treatment.  During treatment: ? Avoid sexual activity until you finish treatment. ? Do not douche. ? Avoid alcohol as directed by your health care provider. ? Avoid breastfeeding as directed by your health care provider.  Drink enough water and fluids to keep your urine clear or pale yellow.  Keep the area around your vagina and rectum clean. ? Wash the area daily with warm water. ? Wipe yourself from front to back after using the toilet.  Keep all follow-up visits as told by your health care provider. This is important. How is this prevented?  Do not douche.  Wash the outside of your vagina with warm water only.  Use protection when having sex. This includes latex condoms and dental dams.  Limit how many sexual partners you have. To help prevent bacterial vaginosis, it is best to have sex with just  one partner (monogamous).  Make sure you and your sexual partner are tested for STIs.  Wear cotton or cotton-lined underwear.  Avoid wearing tight pants and pantyhose, especially during summer.  Limit the amount of alcohol that you drink.  Do not use any products that  contain nicotine or tobacco, such as cigarettes and e-cigarettes. If you need help quitting, ask your health care provider.  Do not use illegal drugs. Where to find more information:  Centers for Disease Control and Prevention: SolutionApps.co.zawww.cdc.gov/std  American Sexual Health Association (ASHA): www.ashastd.org  U.S. Department of Health and Health and safety inspectorHuman Services, Office on Women's Health: ConventionalMedicines.siwww.womenshealth.gov/ or http://www.anderson-williamson.info/https://www.womenshealth.gov/a-z-topics/bacterial-vaginosis Contact a health care provider if:  Your symptoms do not improve, even after treatment.  You have more discharge or pain when urinating.  You have a fever.  You have pain in your abdomen.  You have pain during sex.  You have vaginal bleeding between periods. Summary  Bacterial vaginosis is a vaginal infection that occurs when the normal balance of bacteria in the vagina is disrupted.  Because bacterial vaginosis increases your risk for STIs (sexually transmitted infections), getting treated can help reduce your risk for chlamydia, gonorrhea, herpes, and HIV (human immunodeficiency virus). Treatment is also important for preventing complications in pregnant women, because the condition can cause an early (premature) delivery.  This condition is treated with antibiotic medicines. These may be given as a pill, a vaginal cream, or a medicine that is put into the vagina (suppository). This information is not intended to replace advice given to you by your health care provider. Make sure you discuss any questions you have with your health care provider. Document Released: 11/07/2005 Document Revised: 07/23/2016 Document Reviewed: 07/23/2016 Elsevier Interactive Patient Education  2017 Elsevier Inc.  Pregnancy and Urinary Tract Infection What is a urinary tract infection? A urinary tract infection (UTI) is an infection of any part of the urinary tract. This includes the kidneys, the tubes that connect your kidneys to your bladder  (ureters), the bladder, and the tube that carries urine out of your body (urethra). These organs make, store, and get rid of urine in the body. A UTI can be a bladder infection (cystitis) or a kidney infection (pyelonephritis). This infection may be caused by fungi, viruses, and bacteria. Bacteria are the most common cause of UTIs. You are more likely to develop a UTI during pregnancy because:  The physical and hormonal changes your body goes through can make it easier for bacteria to get into your urinary tract.  Your growing baby puts pressure on your uterus and can affect urine flow.  Does a UTI place my baby at risk? An untreated UTI during pregnancy could lead to a kidney infection, which can cause health problems that could affect your baby. Possible complications of an untreated UTI include:  Having your baby before 37 weeks of pregnancy (premature).  Having a baby with a low birth weight.  Developing high blood pressure during pregnancy (preeclampsia).  What are the symptoms of a UTI? Symptoms of a UTI include:  Fever.  Frequent urination or passing small amounts of urine frequently.  Needing to urinate urgently.  Pain or a burning sensation with urination.  Urine that smells bad or unusual.  Cloudy urine.  Pain in the lower abdomen or back.  Trouble urinating.  Blood in the urine.  Vomiting or being less hungry than normal.  Diarrhea or abdominal pain.  Vaginal discharge.  What are the treatment options for a UTI during pregnancy? Treatment  for this condition may include:  Antibiotic medicines that are safe to take during pregnancy.  Other medicines to treat less common causes of UTI.  How can I prevent a UTI?  To prevent a UTI:  Go to the bathroom as soon as you feel the need.  Always wipe from front to back.  Wash your genital area with soap and warm water daily.  Empty your bladder before and after sex.  Wear cotton underwear.  Limit your  intake of high sugar foods or drinks, such as regular soda, juice, and sweets.  Drink 6-8 glasses of water daily.  Do not wear tight-fitting pants.  Do not douche or use deodorant sprays.  Do not drink alcohol, caffeine, or carbonated drinks. These can irritate the bladder.  Contact a health care provider if:  Your symptoms do not improve or get worse.  You have a fever after two days of treatment.  You have a rash.  You have abnormal vaginal discharge.  You have back or side pain.  You have chills.  You have nausea and vomiting. Get help right away if: Seek immediate medical care if you are pregnant and:  You feel contractions in your uterus.  You have lower belly pain.  You have a gush of fluid from your vagina.  You have blood in your urine.  You are vomiting and cannot keep down any medicines or water.  This information is not intended to replace advice given to you by your health care provider. Make sure you discuss any questions you have with your health care provider. Document Released: 03/04/2011 Document Revised: 10/21/2016 Document Reviewed: 09/28/2015 Elsevier Interactive Patient Education  2017 [employer]

## 2017-05-15 NOTE — MAU Provider Note (Signed)
Chief Complaint: Vaginal Discharge   SUBJECTIVE HPI: Kayla [name] is a 26 y.o. G3P0110 at [redacted]w[redacted]d who presents to M[facility] Admissions reporting unusual vaginal discharge.  Patient states she noticed abnormal vaginal discharge that started 3 days ago. She states it became yellowish and odorish, increased amounts of discharge. This is not normal for her, she's never had discharge like this before. Denies itching or vaginal pain. Denies vaginal bleeding. Denies leaking of fluid. She states she had +Gonorrhea and Chlamydia at the beginning of the pregnancy, was treated and so was partner. Still sexually active with the partner. Last intercourse was 1 month ago. Denies abdominal pain or fevers or urinary symptoms.    Past Medical History:  Diagnosis Date  . Arthritis    "hands and legs" (01/08/2015)  . CAP (community acquired pneumonia) 01/07/2015  . Daily headache    "sometimes" (01/08/2015)  . GERD (gastroesophageal reflux disease)   . Hypertension   . Lung disease   . Lupus   . Pulmonary hypertension (HCC)   . Sjogren's syndrome (HCC)    OB History  Gravida Para Term Preterm AB Living  3 1 0 1 1 0  SAB TAB Ectopic Multiple Live Births  1 0 0 0 0    # Outcome Date GA Lbr Len/2nd Weight Sex Delivery Anes PTL Lv  3 Current           2 Preterm 06/2015 [redacted]w[redacted]d   F    FD  1 SAB  [redacted]w[redacted]d    SAB        Past Surgical History:  Procedure Laterality Date  . CARDIAC CATHETERIZATION N/A 09/09/2015   Procedure: Right Heart Cath;  Surgeon: Laurey Morale, MD;  Location: [facility] INVASIVE CV LAB;  Service: Cardiovascular;  Laterality: N/A;  . DILATION AND EVACUATION N/A 07/13/2015   Procedure: DILATATION AND EVACUATION;  Surgeon: Levie Heritage, DO;  Location: [facility] ORS;  Service: Gynecology;  Laterality: N/A;  . FINGER SURGERY Right 03/2014   "laceration, nerve/artery injury" 2nd digit  . VIDEO BRONCHOSCOPY Bilateral 01/12/2015   Procedure: VIDEO BRONCHOSCOPY WITH FLUORO;  Surgeon: Leslye Peer, MD;   Location: [facility] ENDOSCOPY;  Service: Cardiopulmonary;  Laterality: Bilateral;   Social History   Social History  . Marital status: Single    Spouse name: N/A  . Number of children: N/A  . Years of education: N/A   Occupational History  . [employer]      Workers Compensation   Social History Main Topics  . Smoking status: Former Smoker    Packs/day: 0.10    Years: 5.00    Types: Cigarettes    Quit date: 11/20/2014  . Smokeless tobacco: Former Neurosurgeon  . Alcohol use No     Comment: 01/08/2015 "last drink was New Year's Eve"  . Drug use: No  . Sexual activity: Yes    Birth control/ protection: None     Comment: intercours on 03/07/16   Other Topics Concern  . Not on file   Social History Narrative  . No narrative on file   No current facility-administered medications on file prior to encounter.    Current Outpatient Prescriptions on File Prior to Encounter  Medication Sig Dispense Refill  . aspirin EC 81 MG tablet Take 1 tablet (81 mg total) by mouth daily. 30 tablet 2  . cyclobenzaprine (FLEXERIL) 10 MG tablet Take 1 tablet (10 mg total) by mouth 2 (two) times daily as needed for muscle spasms. (Patient not taking: Reported on 04/25/2017) 20  tablet 0  . fluticasone (FLONASE) 50 MCG/ACT nasal spray Place 2 sprays into both nostrils daily. 16 g 2  . folic acid (FOLVITE) 1 MG tablet Take 1 tablet (1 mg total) by mouth daily. 100 tablet 5  . hydroxychloroquine (PLAQUENIL) 200 MG tablet Take 200 mg by mouth daily.   10  . predniSONE (DELTASONE) 20 MG tablet 3 tabs po daily x 3 days, then 2.5 tabs x 3 days, then 2 tabs x 3 days, then 1 tab x 3 days, then normal dose 26 tablet 0  . predniSONE (DELTASONE) 5 MG tablet Take 10 mg by mouth daily.    . Prenatal Vit-Fe Fumarate-FA (PRENATAL COMPLETE) 14-0.4 MG TABS Take 1 tablet by mouth daily. 30 each 0   Allergies  Allergen Reactions  . Hydrocodone Nausea And Vomiting  . Zithromax [Azithromycin] Itching and Cough    I have reviewed the  past Medical Hx, Surgical Hx, Social Hx, Allergies and Medications.   REVIEW OF SYSTEMS  A comprehensive ROS was negative except per HPI.   OBJECTIVE Patient Vitals for the past 24 hrs:  BP Temp Temp src Pulse Resp SpO2  05/15/17 2205 136/82 - - (!) 104 17 -  05/15/17 2058 138/84 99.1 F (37.3 C) Oral (!) 109 17 -  05/15/17 2057 - - - - - 97 %    PHYSICAL EXAM Constitutional: Well-developed, well-nourished female in no acute distress.  Cardiovascular: normal rate Respiratory: normal rate and effort.  GI: Abd soft, non-tender, non-distended. Pos BS x 4 MS: Extremities nontender, no edema, normal ROM Neurologic: Alert and oriented x 4.  GU: Neg CVAT. SPECULUM EXAM: NEFG, increased white frothy discharge, +odorous, no blood noted, cervix clean BIMANUAL: cervix closed, thick, posterior.  No CMT.  LAB RESULTS Results for orders placed or performed during the hospital encounter of 05/15/17 (from the past 24 hour(s))  Urinalysis, Routine w reflex microscopic     Status: Abnormal   Collection Time: 05/15/17  9:10 PM  Result Value Ref Range   Color, Urine YELLOW YELLOW   APPearance CLOUDY (A) CLEAR   Specific Gravity, Urine 1.019 1.005 - 1.030   pH 7.0 5.0 - 8.0   Glucose, UA NEGATIVE NEGATIVE mg/dL   Hgb urine dipstick NEGATIVE NEGATIVE   Bilirubin Urine NEGATIVE NEGATIVE   Ketones, ur NEGATIVE NEGATIVE mg/dL   Protein, ur 30 (A) NEGATIVE mg/dL   Nitrite POSITIVE (A) NEGATIVE   Leukocytes, UA MODERATE (A) NEGATIVE   RBC / HPF 0-5 0 - 5 RBC/hpf   WBC, UA 0-5 0 - 5 WBC/hpf   Bacteria, UA MANY (A) NONE SEEN   Squamous Epithelial / LPF 6-30 (A) NONE SEEN   Mucous PRESENT   Wet prep, genital     Status: Abnormal   Collection Time: 05/15/17  9:33 PM  Result Value Ref Range   Yeast Wet Prep HPF POC NONE SEEN NONE SEEN   Trich, Wet Prep NONE SEEN NONE SEEN   Clue Cells Wet Prep HPF POC PRESENT (A) NONE SEEN   WBC, Wet Prep HPF POC MODERATE (A) NONE SEEN   Sperm NONE SEEN      IMAGING No results found.  MAU COURSE Wet prep - +BV GC/CT - pending SVE - closed/thick FHT 160s UA +nitrite, many bacteria, sent for UCX  MDM Plan of care reviewed with patient, including labs and tests ordered and medical treatment. Discussed diagnosis of BV and UTI. Patient states this is her second UTI, recommended she talk to her OB  and may need to be placed on prophylaxis during pregnancy. Return precautions given.   ASSESSMENT 1. Bacterial vaginosis   2. UTI (urinary tract infection) during pregnancy, second trimester     PLAN Discharge home in stable condition. Macrobid Flagyl Follow up with OB provider GC/CT cultures pending   Allergies as of 05/15/2017      Reactions   Hydrocodone Nausea And Vomiting   Zithromax [azithromycin] Itching, Cough      Medication List    TAKE these medications   aspirin EC 81 MG tablet Take 1 tablet (81 mg total) by mouth daily.   cyclobenzaprine 10 MG tablet Commonly known as:  FLEXERIL Take 1 tablet (10 mg total) by mouth 2 (two) times daily as needed for muscle spasms.   fluticasone 50 MCG/ACT nasal spray Commonly known as:  FLONASE Place 2 sprays into both nostrils daily.   folic acid 1 MG tablet Commonly known as:  FOLVITE Take 1 tablet (1 mg total) by mouth daily.   hydroxychloroquine 200 MG tablet Commonly known as:  PLAQUENIL Take 200 mg by mouth daily.   metroNIDAZOLE 500 MG tablet Commonly known as:  FLAGYL Take 1 tablet (500 mg total) by mouth 2 (two) times daily.   nitrofurantoin (macrocrystal-monohydrate) 100 MG capsule Commonly known as:  MACROBID Take 1 capsule (100 mg total) by mouth 2 (two) times daily.   predniSONE 5 MG tablet Commonly known as:  DELTASONE Take 10 mg by mouth daily.   predniSONE 20 MG tablet Commonly known as:  DELTASONE 3 tabs po daily x 3 days, then 2.5 tabs x 3 days, then 2 tabs x 3 days, then 1 tab x 3 days, then normal dose   PRENATAL COMPLETE 14-0.4 MG Tabs Take  1 tablet by mouth daily.        Jen MowElizabeth Ethan Kasperski, DO OB Fellow 05/15/2017 9:35 PM

## 2017-05-16 LAB — GC/CHLAMYDIA PROBE AMP (~~LOC~~) NOT AT ARMC
CHLAMYDIA, DNA PROBE: NEGATIVE
Neisseria Gonorrhea: NEGATIVE

## 2017-05-18 ENCOUNTER — Encounter (HOSPITAL_COMMUNITY): Payer: Self-pay

## 2017-05-18 ENCOUNTER — Ambulatory Visit (HOSPITAL_COMMUNITY)
Admission: RE | Admit: 2017-05-18 | Discharge: 2017-05-18 | Disposition: A | Discharge status: Home / Self Care | Payer: Medicare Other | Source: Ambulatory Visit | Attending: Obstetrics & Gynecology | Admitting: Obstetrics & Gynecology

## 2017-05-18 ENCOUNTER — Ambulatory Visit (INDEPENDENT_AMBULATORY_CARE_PROVIDER_SITE_OTHER): Payer: Medicare Other | Admitting: *Deleted

## 2017-05-18 ENCOUNTER — Other Ambulatory Visit (HOSPITAL_COMMUNITY): Payer: Self-pay | Admitting: *Deleted

## 2017-05-18 VITALS — BP 119/70 | HR 107

## 2017-05-18 DIAGNOSIS — Z3689 Encounter for other specified antenatal screening: Secondary | ICD-10-CM | POA: Insufficient documentation

## 2017-05-18 DIAGNOSIS — O09899 Supervision of other high risk pregnancies, unspecified trimester: Secondary | ICD-10-CM

## 2017-05-18 DIAGNOSIS — Z3A20 20 weeks gestation of pregnancy: Secondary | ICD-10-CM | POA: Diagnosis not present

## 2017-05-18 DIAGNOSIS — O09212 Supervision of pregnancy with history of pre-term labor, second trimester: Secondary | ICD-10-CM | POA: Diagnosis present

## 2017-05-18 DIAGNOSIS — O09299 Supervision of pregnancy with other poor reproductive or obstetric history, unspecified trimester: Secondary | ICD-10-CM | POA: Diagnosis not present

## 2017-05-18 DIAGNOSIS — O09219 Supervision of pregnancy with history of pre-term labor, unspecified trimester: Principal | ICD-10-CM

## 2017-05-18 DIAGNOSIS — O26892 Other specified pregnancy related conditions, second trimester: Secondary | ICD-10-CM | POA: Diagnosis not present

## 2017-05-18 DIAGNOSIS — O10012 Pre-existing essential hypertension complicating pregnancy, second trimester: Secondary | ICD-10-CM | POA: Insufficient documentation

## 2017-05-18 DIAGNOSIS — M3500 Sicca syndrome, unspecified: Secondary | ICD-10-CM | POA: Diagnosis not present

## 2017-05-18 DIAGNOSIS — M329 Systemic lupus erythematosus, unspecified: Secondary | ICD-10-CM | POA: Insufficient documentation

## 2017-05-18 LAB — CULTURE, OB URINE

## 2017-05-18 MED ORDER — HYDROXYPROGESTERONE CAPROATE 250 MG/ML IM OIL
250.0000 mg | TOPICAL_OIL | INTRAMUSCULAR | Status: DC
  Administered 2017-05-18 – 2017-06-15 (×5): 250 mg via INTRAMUSCULAR

## 2017-05-18 NOTE — Progress Notes (Signed)
Patient walked in to clinic to check on if her Kayla [name] was here and it was. Patient has appointment in this office tomorrow but did not want to wait. [name] was given and tolerated well.

## 2017-05-19 ENCOUNTER — Encounter: Payer: Self-pay | Admitting: Family Medicine

## 2017-05-19 ENCOUNTER — Ambulatory Visit (INDEPENDENT_AMBULATORY_CARE_PROVIDER_SITE_OTHER): Payer: Medicare Other | Admitting: Family Medicine

## 2017-05-19 ENCOUNTER — Other Ambulatory Visit (HOSPITAL_COMMUNITY): Payer: Self-pay | Admitting: *Deleted

## 2017-05-19 VITALS — BP 124/70 | HR 115 | Wt 181.3 lb

## 2017-05-19 DIAGNOSIS — O9989 Other specified diseases and conditions complicating pregnancy, childbirth and the puerperium: Principal | ICD-10-CM

## 2017-05-19 DIAGNOSIS — O219 Vomiting of pregnancy, unspecified: Secondary | ICD-10-CM

## 2017-05-19 DIAGNOSIS — O99891 Other specified diseases and conditions complicating pregnancy: Secondary | ICD-10-CM

## 2017-05-19 DIAGNOSIS — M329 Systemic lupus erythematosus, unspecified: Secondary | ICD-10-CM

## 2017-05-19 DIAGNOSIS — O10919 Unspecified pre-existing hypertension complicating pregnancy, unspecified trimester: Secondary | ICD-10-CM

## 2017-05-19 DIAGNOSIS — O0992 Supervision of high risk pregnancy, unspecified, second trimester: Secondary | ICD-10-CM

## 2017-05-19 DIAGNOSIS — N390 Urinary tract infection, site not specified: Secondary | ICD-10-CM

## 2017-05-19 DIAGNOSIS — O10912 Unspecified pre-existing hypertension complicating pregnancy, second trimester: Secondary | ICD-10-CM

## 2017-05-19 DIAGNOSIS — O0990 Supervision of high risk pregnancy, unspecified, unspecified trimester: Secondary | ICD-10-CM

## 2017-05-19 MED ORDER — NITROFURANTOIN MONOHYD MACRO 100 MG PO CAPS: 100.0000 mg | ORAL_CAPSULE | Freq: Every day | ORAL | 5 refills | Status: DC

## 2017-05-19 MED ORDER — ONDANSETRON 4 MG PO TBDP: 4.0000 mg | ORAL_TABLET | Freq: Four times a day (QID) | ORAL | 0 refills | Status: DC | PRN

## 2017-05-19 NOTE — Patient Instructions (Signed)
Second Trimester of Pregnancy The second trimester is from week 13 through week 28, month 4 through 6. This is often the time in pregnancy that you feel your best. Often times, morning sickness has lessened or quit. You may have more energy, and you may get hungry more often. Your unborn baby (fetus) is growing rapidly. At the end of the sixth month, he or she is about 9 inches long and weighs about 1 pounds. You will likely feel the baby move (quickening) between 18 and 20 weeks of pregnancy. Follow these instructions at home:  Avoid all smoking, herbs, and alcohol. Avoid drugs not approved by your doctor.  Do not use any tobacco products, including cigarettes, chewing tobacco, and electronic cigarettes. If you need help quitting, ask your doctor. You may get counseling or other support to help you quit.  Only take medicine as told by your doctor. Some medicines are safe and some are not during pregnancy.  Exercise only as told by your doctor. Stop exercising if you start having cramps.  Eat regular, healthy meals.  Wear a good support bra if your breasts are tender.  Do not use hot tubs, steam rooms, or saunas.  Wear your seat belt when driving.  Avoid raw meat, uncooked cheese, and liter boxes and soil used by cats.  Take your prenatal vitamins.  Take 1500-2000 milligrams of calcium daily starting at the 20th week of pregnancy until you deliver your baby.  Try taking medicine that helps you poop (stool softener) as needed, and if your doctor approves. Eat more fiber by eating fresh fruit, vegetables, and whole grains. Drink enough fluids to keep your pee (urine) clear or pale yellow.  Take warm water baths (sitz baths) to soothe pain or discomfort caused by hemorrhoids. Use hemorrhoid cream if your doctor approves.  If you have puffy, bulging veins (varicose veins), wear support hose. Raise (elevate) your feet for 15 minutes, 3-4 times a day. Limit salt in your diet.  Avoid heavy  lifting, wear low heals, and sit up straight.  Rest with your legs raised if you have leg cramps or low back pain.  Visit your dentist if you have not gone during your pregnancy. Use a soft toothbrush to brush your teeth. Be gentle when you floss.  You can have sex (intercourse) unless your doctor tells you not to.  Go to your doctor visits. Get help if:  You feel dizzy.  You have mild cramps or pressure in your lower belly (abdomen).  You have a nagging pain in your belly area.  You continue to feel sick to your stomach (nauseous), throw up (vomit), or have watery poop (diarrhea).  You have bad smelling fluid coming from your vagina.  You have pain with peeing (urination). Get help right away if:  You have a fever.  You are leaking fluid from your vagina.  You have spotting or bleeding from your vagina.  You have severe belly cramping or pain.  You lose or gain weight rapidly.  You have trouble catching your breath and have chest pain.  You notice sudden or extreme puffiness (swelling) of your face, hands, ankles, feet, or legs.  You have not felt the baby move in over an hour.  You have severe headaches that do not go away with medicine.  You have vision changes. This information is not intended to replace advice given to you by your health care provider. Make sure you discuss any questions you have with your health care   provider. Document Released: 02/01/2010 Document Revised: 04/14/2016 Document Reviewed: 01/08/2013 Elsevier Interactive Patient Education  2017 Elsevier Inc.  

## 2017-05-20 NOTE — Progress Notes (Signed)
   PRENATAL VISIT NOTE  Subjective:  Kayla [name] is a 26 y.o. G3P0110 at 2754w4d being seen today for ongoing prenatal care.  She is currently monitored for the following issues for this high-risk pregnancy and has Interstitial lung disease (HCC); Supervision of high risk pregnancy, antepartum; Systemic lupus erythematosus (SLE) affecting pregnancy, antepartum (HCC); Lupus (systemic lupus erythematosus) (HCC); Loud P2 (pulmonary S2, second heart sound); Insomnia; Other secondary pulmonary hypertension (HCC); Chronic hypertension; Chronic hypertension during pregnancy, antepartum; Sjogren's syndrome (HCC); Chronic Respiratory failure with oxygen requirement affecting pregnancy, antepartum; SS-A antibody positive; SS-B antibody positive; History of fetal loss; and Gonorrhea affecting pregnancy on her problem list.  Patient reports increasing SOB with advancing pregnancy. Also reports ongoing nausea. Seen in MAU for UTI. Has seen [facility], and has no pulmonary HTN on ECHO. Contractions: Not present. Vag. Bleeding: None.  Movement: Present. Denies leaking of fluid.   The following portions of the patient's history were reviewed and updated as appropriate: allergies, current medications, past family history, past medical history, past social history, past surgical history and problem list. Problem list updated.  Objective:   Vitals:   05/19/17 0826  BP: 124/70  Pulse: (!) 115  Weight: 181 lb 4.8 oz (82.2 kg)    Fetal Status: Fetal Heart Rate (bpm): 160 Fundal Height: 18 cm Movement: Present     General:  Alert, oriented and cooperative. Patient is in no acute distress.  Skin: Skin is warm and dry. No rash noted.   Cardiovascular: Normal heart rate noted  Respiratory: Normal respiratory effort, no problems with respiration noted  Abdomen: Soft, gravid, appropriate for gestational age. Pain/Pressure: Absent     Pelvic:  Cervical exam deferred        Extremities: Normal range of motion.   Edema: None  Mental Status: Normal mood and affect. Normal behavior. Normal judgment and thought content.   Assessment and Plan:  Pregnancy: G3P0110 at 2724w4d  1. Chronic hypertension during pregnancy, antepartum BP is well controlled Continue ASA  2. Supervision of high risk pregnancy, antepartum F/u U/s for growth with MFM scheduled in 4 wks Anatomy u/s shows EIF left ventricle and calcifications in the stomach wall--felt to be anatomic variant  3. Systemic lupus erythematosus (SLE) affecting pregnancy, antepartum (HCC) Continue prednisone See Rheum next month and discuss worsening SOB--normal ECHO with cards  4. Recurrent UTI On Macrobid--will prophylax - nitrofurantoin, macrocrystal-monohydrate, (MACROBID) 100 MG capsule; Take 1 capsule (100 mg total) by mouth at bedtime.  Dispense: 30 capsule; Refill: 5  5. Nausea and vomiting in pregnancy prior to [redacted] weeks gestation Reports nausea--trial of Zofran--put of first trimester - ondansetron (ZOFRAN ODT) 4 MG disintegrating tablet; Take 1 tablet (4 mg total) by mouth every 6 (six) hours as needed for nausea.  Dispense: 20 tablet; Refill: 0  6. SSA/SSB positive To see [facility] next week for ECHO May get weekly FHTs with her 17P to ensure no heartblock.  General obstetric precautions including but not limited to vaginal bleeding, contractions, leaking of fluid and fetal movement were reviewed in detail with the patient. Please refer to After Visit Summary for other counseling recommendations.  Return in about 3 weeks (around 06/09/2017) for 17 P weekly.   Reva Boresanya S Demarea Lorey, MD

## 2017-05-25 ENCOUNTER — Telehealth: Payer: Self-pay

## 2017-05-25 NOTE — Telephone Encounter (Signed)
-----   Message from Allie BossierMyra C Dove, MD sent at 05/23/2017 10:43 AM EDT ----- She needs a fetal echo per MFM. Thanks

## 2017-05-25 NOTE — Telephone Encounter (Signed)
Patient has been scheduled for Dr.Cotton office on 07/07/2017 @9 :00 for a fetal echo

## 2017-05-26 ENCOUNTER — Ambulatory Visit (INDEPENDENT_AMBULATORY_CARE_PROVIDER_SITE_OTHER): Payer: Medicare Other | Admitting: *Deleted

## 2017-05-26 VITALS — BP 116/64 | HR 103 | Wt 181.1 lb

## 2017-05-26 DIAGNOSIS — O09899 Supervision of other high risk pregnancies, unspecified trimester: Secondary | ICD-10-CM

## 2017-05-26 DIAGNOSIS — O09219 Supervision of pregnancy with history of pre-term labor, unspecified trimester: Principal | ICD-10-CM

## 2017-05-26 DIAGNOSIS — O09212 Supervision of pregnancy with history of pre-term labor, second trimester: Secondary | ICD-10-CM | POA: Diagnosis present

## 2017-06-02 ENCOUNTER — Ambulatory Visit (INDEPENDENT_AMBULATORY_CARE_PROVIDER_SITE_OTHER): Payer: Medicare Other | Admitting: *Deleted

## 2017-06-02 VITALS — BP 112/71 | HR 98 | Wt 183.1 lb

## 2017-06-02 DIAGNOSIS — O09899 Supervision of other high risk pregnancies, unspecified trimester: Secondary | ICD-10-CM

## 2017-06-02 DIAGNOSIS — O09212 Supervision of pregnancy with history of pre-term labor, second trimester: Secondary | ICD-10-CM

## 2017-06-02 DIAGNOSIS — O09219 Supervision of pregnancy with history of pre-term labor, unspecified trimester: Principal | ICD-10-CM

## 2017-06-02 NOTE — Progress Notes (Signed)
[name] 250 mg IM administered as scheduled.  Pt tolerated well.  

## 2017-06-04 ENCOUNTER — Inpatient Hospital Stay (HOSPITAL_COMMUNITY): Payer: Medicare Other

## 2017-06-04 ENCOUNTER — Inpatient Hospital Stay (HOSPITAL_COMMUNITY)
Admission: AD | Admit: 2017-06-04 | Discharge: 2017-06-04 | Disposition: A | Discharge status: Home / Self Care | Payer: Medicare Other | Source: Ambulatory Visit | Attending: Obstetrics & Gynecology | Admitting: Obstetrics & Gynecology

## 2017-06-04 ENCOUNTER — Encounter (HOSPITAL_COMMUNITY): Payer: Self-pay

## 2017-06-04 DIAGNOSIS — Z3A22 22 weeks gestation of pregnancy: Secondary | ICD-10-CM | POA: Diagnosis not present

## 2017-06-04 DIAGNOSIS — Z87891 Personal history of nicotine dependence: Secondary | ICD-10-CM | POA: Insufficient documentation

## 2017-06-04 DIAGNOSIS — R0602 Shortness of breath: Secondary | ICD-10-CM | POA: Insufficient documentation

## 2017-06-04 DIAGNOSIS — E860 Dehydration: Secondary | ICD-10-CM | POA: Insufficient documentation

## 2017-06-04 DIAGNOSIS — O212 Late vomiting of pregnancy: Secondary | ICD-10-CM | POA: Diagnosis present

## 2017-06-04 DIAGNOSIS — O10013 Pre-existing essential hypertension complicating pregnancy, third trimester: Secondary | ICD-10-CM | POA: Insufficient documentation

## 2017-06-04 DIAGNOSIS — Z8679 Personal history of other diseases of the circulatory system: Secondary | ICD-10-CM

## 2017-06-04 DIAGNOSIS — A084 Viral intestinal infection, unspecified: Secondary | ICD-10-CM | POA: Diagnosis not present

## 2017-06-04 DIAGNOSIS — O219 Vomiting of pregnancy, unspecified: Secondary | ICD-10-CM

## 2017-06-04 DIAGNOSIS — O99612 Diseases of the digestive system complicating pregnancy, second trimester: Secondary | ICD-10-CM | POA: Insufficient documentation

## 2017-06-04 LAB — CBC
HEMATOCRIT: 34.9 % — AB (ref 36.0–46.0)
HEMOGLOBIN: 11.8 g/dL — AB (ref 12.0–15.0)
MCH: 28.2 pg (ref 26.0–34.0)
MCHC: 33.8 g/dL (ref 30.0–36.0)
MCV: 83.5 fL (ref 78.0–100.0)
Platelets: 193 10*3/uL (ref 150–400)
RBC: 4.18 MIL/uL (ref 3.87–5.11)
RDW: 13.8 % (ref 11.5–15.5)
WBC: 4.7 10*3/uL (ref 4.0–10.5)

## 2017-06-04 LAB — COMPREHENSIVE METABOLIC PANEL
ALBUMIN: 3.4 g/dL — AB (ref 3.5–5.0)
ALK PHOS: 50 U/L (ref 38–126)
ALT: 11 U/L — AB (ref 14–54)
AST: 20 U/L (ref 15–41)
Anion gap: 9 (ref 5–15)
BILIRUBIN TOTAL: 0.5 mg/dL (ref 0.3–1.2)
BUN: 6 mg/dL (ref 6–20)
CALCIUM: 9.1 mg/dL (ref 8.9–10.3)
CO2: 23 mmol/L (ref 22–32)
CREATININE: 0.52 mg/dL (ref 0.44–1.00)
Chloride: 104 mmol/L (ref 101–111)
GFR calc Af Amer: >60 mL/min (ref >60)
Glucose, Bld: 133 mg/dL — ABNORMAL HIGH (ref 65–99)
Potassium: 3.3 mmol/L — ABNORMAL LOW (ref 3.5–5.1)
Sodium: 136 mmol/L (ref 135–145)
TOTAL PROTEIN: 6.8 g/dL (ref 6.5–8.1)

## 2017-06-04 LAB — URINALYSIS, ROUTINE W REFLEX MICROSCOPIC
Bilirubin Urine: NEGATIVE
Glucose, UA: NEGATIVE mg/dL
Hgb urine dipstick: NEGATIVE
Ketones, ur: NEGATIVE mg/dL
NITRITE: NEGATIVE
Protein, ur: 30 mg/dL — AB
RBC / HPF: NONE SEEN RBC/hpf (ref 0–5)
SPECIFIC GRAVITY, URINE: 1.023 (ref 1.005–1.030)
pH: 6 (ref 5.0–8.0)

## 2017-06-04 MED ORDER — IPRATROPIUM-ALBUTEROL 0.5-2.5 (3) MG/3ML IN SOLN
3.0000 mL | Freq: Once | RESPIRATORY_TRACT | Status: AC
  Administered 2017-06-04: 3 mL via RESPIRATORY_TRACT
  Filled 2017-06-04: qty 3

## 2017-06-04 MED ORDER — PROMETHAZINE HCL 25 MG/ML IJ SOLN
25.0000 mg | Freq: Four times a day (QID) | INTRAMUSCULAR | Status: DC | PRN
  Administered 2017-06-04: 25 mg via INTRAVENOUS
  Filled 2017-06-04: qty 1

## 2017-06-04 MED ORDER — DEXAMETHASONE SODIUM PHOSPHATE 10 MG/ML IJ SOLN
10.0000 mg | Freq: Once | INTRAMUSCULAR | Status: AC
  Administered 2017-06-04: 10 mg via INTRAVENOUS
  Filled 2017-06-04: qty 1

## 2017-06-04 MED ORDER — LACTATED RINGERS IV BOLUS (SEPSIS): 1000.0000 mL | Freq: Once | INTRAVENOUS | Status: DC

## 2017-06-04 MED ORDER — LACTATED RINGERS IV BOLUS (SEPSIS)
1000.0000 mL | Freq: Once | INTRAVENOUS | Status: AC
  Administered 2017-06-04: 1000 mL via INTRAVENOUS

## 2017-06-04 MED ORDER — ONDANSETRON HCL 4 MG/2ML IJ SOLN
4.0000 mg | Freq: Once | INTRAMUSCULAR | Status: AC
  Administered 2017-06-04: 4 mg via INTRAVENOUS
  Filled 2017-06-04: qty 2

## 2017-06-04 MED ORDER — POTASSIUM CHLORIDE 2 MEQ/ML IV SOLN
Freq: Once | INTRAVENOUS | Status: DC
  Filled 2017-06-04: qty 1000

## 2017-06-04 MED ORDER — BUTALBITAL-APAP-CAFFEINE 50-325-40 MG PO TABS
2.0000 | ORAL_TABLET | Freq: Once | ORAL | Status: AC
  Administered 2017-06-04: 2 via ORAL
  Filled 2017-06-04: qty 2

## 2017-06-04 MED ORDER — DIPHENHYDRAMINE HCL 50 MG/ML IJ SOLN
25.0000 mg | Freq: Once | INTRAMUSCULAR | Status: AC
  Administered 2017-06-04: 25 mg via INTRAVENOUS
  Filled 2017-06-04: qty 1

## 2017-06-04 MED ORDER — PROMETHAZINE HCL 25 MG PO TABS: 12.5000 mg | ORAL_TABLET | Freq: Four times a day (QID) | ORAL | 2 refills | Status: DC | PRN

## 2017-06-04 NOTE — MAU Note (Signed)
Started having diarrhea last night, threw up like 4 times, last was 0600.  HA started last night. Hx of migraines, took some Tylenol at 0200, no real relief.

## 2017-06-04 NOTE — MAU Provider Note (Signed)
Chief Complaint:  Headache; Emesis; and Diarrhea   First Provider Initiated Contact with Patient 06/04/17 1247      HPI: Kayla [name] is a 26 y.o. G3P0110 at [redacted]w[redacted]d with medical hx significant for lupus, CHTN, sjogrens, IUFD @ 35 weeks who presents to maternity admissions reporting onset of n/v/d last night, worsening today.  She reports emesis 5+ times in 24 hours and diarrhea x3-4.  She took Zofran this morning at 2 am and it did not help. The n/v/d is associated with frontal h/a onset today with photophobia.  She reports shortness of breath that started weeks ago and is gradually worsening. She has appointment on Tuesday with [facility].  She reports good fetal movement, denies LOF, vaginal bleeding, vaginal itching/burning, urinary symptoms, dizziness, or fever/chills.    HPI  Past Medical History: Past Medical History:  Diagnosis Date  . Arthritis    "hands and legs" (01/08/2015)  . CAP (community acquired pneumonia) 01/07/2015  . Daily headache    "sometimes" (01/08/2015)  . GERD (gastroesophageal reflux disease)   . Hypertension   . Lung disease   . Lupus   . Pulmonary hypertension (HCC)   . Sjogren's syndrome (HCC)     Past obstetric history: OB History  Gravida Para Term Preterm AB Living  3 1 0 1 1 0  SAB TAB Ectopic Multiple Live Births  1 0 0 0 0    # Outcome Date GA Lbr Len/2nd Weight Sex Delivery Anes PTL Lv  3 Current           2 Preterm 06/2015 [redacted]w[redacted]d   F    FD  1 SAB  [redacted]w[redacted]d    SAB         Past Surgical History: Past Surgical History:  Procedure Laterality Date  . CARDIAC CATHETERIZATION N/A 09/09/2015   Procedure: Right Heart Cath;  Surgeon: Laurey Morale, MD;  Location: [facility] INVASIVE CV LAB;  Service: Cardiovascular;  Laterality: N/A;  . DILATION AND EVACUATION N/A 07/13/2015   Procedure: DILATATION AND EVACUATION;  Surgeon: Levie Heritage, DO;  Location: [facility] ORS;  Service: Gynecology;  Laterality: N/A;  . FINGER SURGERY Right 03/2014   "laceration,  nerve/artery injury" 2nd digit  . VIDEO BRONCHOSCOPY Bilateral 01/12/2015   Procedure: VIDEO BRONCHOSCOPY WITH FLUORO;  Surgeon: Leslye Peer, MD;  Location: [facility] ENDOSCOPY;  Service: Cardiopulmonary;  Laterality: Bilateral;    Family History: Family History  Problem Relation Age of Onset  . Diabetes Mother   . Diabetes Maternal Aunt   . Diabetes Maternal Grandmother     Social History: Social History  Substance Use Topics  . Smoking status: Former Smoker    Packs/day: 0.10    Years: 5.00    Types: Cigarettes    Quit date: 11/20/2014  . Smokeless tobacco: Never Used  . Alcohol use No    Allergies:  Allergies  Allergen Reactions  . Hydrocodone Nausea And Vomiting  . Zithromax [Azithromycin] Itching and Cough    Meds:  Facility-Administered Medications Prior to Admission  Medication Dose Route Frequency Provider Last Rate Last Dose  . hydroxyprogesterone caproate ([name]) 250 mg/mL injection 250 mg  250 mg Intramuscular Weekly Reva Bores, MD   250 mg at 06/02/17 1610   Prescriptions Prior to Admission  Medication Sig Dispense Refill Last Dose  . aspirin EC 81 MG tablet Take 1 tablet (81 mg total) by mouth daily. 30 tablet 2 06/03/2017 at unsure  . cyclobenzaprine (FLEXERIL) 10 MG tablet Take 1 tablet (  10 mg total) by mouth 2 (two) times daily as needed for muscle spasms. 20 tablet 0 Past Month at Unknown time  . fluticasone (FLONASE) 50 MCG/ACT nasal spray Place 2 sprays into both nostrils daily. 16 g 2 06/03/2017 at Unknown time  . folic acid (FOLVITE) 1 MG tablet Take 1 tablet (1 mg total) by mouth daily. 100 tablet 5 06/03/2017 at Unknown time  . hydroxychloroquine (PLAQUENIL) 200 MG tablet Take 200 mg by mouth daily.   10 06/03/2017 at Unknown time  . ondansetron (ZOFRAN ODT) 4 MG disintegrating tablet Take 1 tablet (4 mg total) by mouth every 6 (six) hours as needed for nausea. 20 tablet 0 06/04/2017 at Unknown time  . predniSONE (DELTASONE) 5 MG tablet Take 10 mg by  mouth daily.   06/03/2017 at Unknown time  . Prenatal Vit-Fe Fumarate-FA (PRENATAL COMPLETE) 14-0.4 MG TABS Take 1 tablet by mouth daily. 30 each 0 06/03/2017 at Unknown time    ROS:  Review of Systems  Constitutional: Negative for chills, fatigue and fever.  Respiratory: Positive for shortness of breath.   Cardiovascular: Negative for chest pain.  Gastrointestinal: Positive for diarrhea, nausea and vomiting.  Genitourinary: Negative for difficulty urinating, dysuria, flank pain, pelvic pain, vaginal bleeding, vaginal discharge and vaginal pain.  Musculoskeletal: Positive for back pain.  Neurological: Positive for headaches. Negative for dizziness.  Psychiatric/Behavioral: Negative.      I have reviewed patient's Past Medical Hx, Surgical Hx, Family Hx, Social Hx, medications and allergies.   Physical Exam  Patient Vitals for the past 24 hrs:  BP Temp Temp src Pulse Resp SpO2 Height Weight  06/04/17 1735 - - - - - 96 % - -  06/04/17 1716 - - - - - 100 % - -  06/04/17 1503 - - - 91 - 96 % - -  06/04/17 1458 - - - - - 96 % - -  06/04/17 1453 - - - 90 - - - -  06/04/17 1448 - - - - - 96 % - -  06/04/17 1443 - - - 88 - 97 % - -  06/04/17 1437 - - - 82 - 100 % - -  06/04/17 1432 - - - - - 96 % - -  06/04/17 1204 131/69 98.2 F (36.8 C) Oral (!) 122 18 96 % 5\' 3"  (1.6 m) 181 lb 12 oz (82.4 kg)  06/04/17 1202 - - - - - 96 % - -   Constitutional: Well-developed, well-nourished female in no acute distress.  HEART: normal rate, heart sounds, regular rhythm RESP: normal effort, lung sounds clear and equal bilaterally GI: Abd soft, non-tender, gravid appropriate for gestational age.  MS: Extremities nontender, no edema, normal ROM Neurologic: Alert and oriented x 4.  GU: Neg CVAT.      FHT:  161 by doppler  Labs: Results for orders placed or performed during the hospital encounter of 06/04/17 (from the past 24 hour(s))  Urinalysis, Routine w reflex microscopic     Status: Abnormal    Collection Time: 06/04/17 12:08 PM  Result Value Ref Range   Color, Urine YELLOW YELLOW   APPearance HAZY (A) CLEAR   Specific Gravity, Urine 1.023 1.005 - 1.030   pH 6.0 5.0 - 8.0   Glucose, UA NEGATIVE NEGATIVE mg/dL   Hgb urine dipstick NEGATIVE NEGATIVE   Bilirubin Urine NEGATIVE NEGATIVE   Ketones, ur NEGATIVE NEGATIVE mg/dL   Protein, ur 30 (A) NEGATIVE mg/dL   Nitrite NEGATIVE NEGATIVE  Leukocytes, UA TRACE (A) NEGATIVE   RBC / HPF NONE SEEN 0 - 5 RBC/hpf   WBC, UA 6-30 0 - 5 WBC/hpf   Bacteria, UA RARE (A) NONE SEEN   Squamous Epithelial / LPF TOO NUMEROUS TO COUNT (A) NONE SEEN   Mucous PRESENT   CBC     Status: Abnormal   Collection Time: 06/04/17  1:06 PM  Result Value Ref Range   WBC 4.7 4.0 - 10.5 K/uL   RBC 4.18 3.87 - 5.11 MIL/uL   Hemoglobin 11.8 (L) 12.0 - 15.0 g/dL   HCT 09.8 (L) 11.9 - 14.7 %   MCV 83.5 78.0 - 100.0 fL   MCH 28.2 26.0 - 34.0 pg   MCHC 33.8 30.0 - 36.0 g/dL   RDW 82.9 56.2 - 13.0 %   Platelets 193 150 - 400 K/uL  Comprehensive metabolic panel     Status: Abnormal   Collection Time: 06/04/17  1:06 PM  Result Value Ref Range   Sodium 136 135 - 145 mmol/L   Potassium 3.3 (L) 3.5 - 5.1 mmol/L   Chloride 104 101 - 111 mmol/L   CO2 23 22 - 32 mmol/L   Glucose, Bld 133 (H) 65 - 99 mg/dL   BUN 6 6 - 20 mg/dL   Creatinine, Ser 8.65 0.44 - 1.00 mg/dL   Calcium 9.1 8.9 - 78.4 mg/dL   Total Protein 6.8 6.5 - 8.1 g/dL   Albumin 3.4 (L) 3.5 - 5.0 g/dL   AST 20 15 - 41 U/L   ALT 11 (L) 14 - 54 U/L   Alkaline Phosphatase 50 38 - 126 U/L   Total Bilirubin 0.5 0.3 - 1.2 mg/dL   GFR calc non Af Amer >60 >60 mL/min   GFR calc Af Amer >60 >60 mL/min   Anion gap 9 5 - 15      Imaging:  Dg Chest 2 View  Result Date: 06/04/2017 CLINICAL DATA:  Dyspnea, lupus, second trimester pregnancy EXAM: CHEST  2 VIEW COMPARISON:  02/18/2016 chest radiograph. FINDINGS: Stable cardiomediastinal silhouette with top-normal heart size. No pneumothorax. No pleural  effusion. Bandlike and reticular opacities scattered throughout both lungs, most prominent in the mid lungs, not appreciably changed. No acute consolidative airspace disease. IMPRESSION: 1. No acute cardiopulmonary disease. 2. Stable patchy reticular and bandlike opacities with associated distortion, most prominent in the mid lungs, compatible with chronic interstitial lung disease. Electronically Signed   By: Delbert Phenix M.D.   On: 06/04/2017 17:00     MAU Course/MDM: I have ordered labs and reviewed results.  FHT wnl Sudden onset of n/v/d most c/w viral gastroenteritis No evidence of pulmonary emergency with heart and lung sounds wnl with O2 sats 100% on RA today H/A treated with headache cocktail but replaced usual Reglan with Zofran d/t diarrhea, so pt given LR x 1000 ml, Benadryl 25 mg IV, Zofran 4 mg IV, and Decadron 10 mg IV.  No vomiting or diarrhea in MAU and h/a mostly gone but pt reports nausea and mild SOB persist SOB with gradual onset over time, no changes while in MAU and after IV fluids.  Heart and lung sounds wnl, O2 sats 96-100% on RA Pt to follow up about SOB with Rheumatology next Tuesday  Pt requests albuterol treatment Consult Dr Despina Hidden with history, assessment and findings Respiratory to give nebulizer breathing treatment in MAU today and Chest X-ray ordered. Results without acute findings and c/w chronic interstitial lung disease. Pt to f/u with prenatal care  and rheumatology as scheduled Return to MAU as needed for emergencies Rx for Phenergan 12.5-25 mg PO PRN nausea, pt to continue Zofran PRN, may also take imodium OTC PRN for diarrhea Pt stable at time of discharge.   Assessment: 1. Nausea and vomiting in pregnancy   2. Shortness of breath   3. Hx of pulmonary hypertension   4. Viral gastroenteritis   5. Mild dehydration     Plan: Discharge home   Follow-up Information    [facility] Follow up.   Why:  As scheduled        [facility]  [place] MATERNITY ADMISSIONS Follow up.   Why:  Return to MAU as needed for emergencies Contact information: [address] [id number] mc [place] [address] [phone]         Allergies as of 06/04/2017      Reactions   Hydrocodone Nausea And Vomiting   Zithromax [azithromycin] Itching, Cough      Medication List    TAKE these medications   aspirin EC 81 MG tablet Take 1 tablet (81 mg total) by mouth daily.   cyclobenzaprine 10 MG tablet Commonly known as:  FLEXERIL Take 1 tablet (10 mg total) by mouth 2 (two) times daily as needed for muscle spasms.   fluticasone 50 MCG/ACT nasal spray Commonly known as:  FLONASE Place 2 sprays into both nostrils daily.   folic acid 1 MG tablet Commonly known as:  FOLVITE Take 1 tablet (1 mg total) by mouth daily.   hydroxychloroquine 200 MG tablet Commonly known as:  PLAQUENIL Take 200 mg by mouth daily.   ondansetron 4 MG disintegrating tablet Commonly known as:  ZOFRAN ODT Take 1 tablet (4 mg total) by mouth every 6 (six) hours as needed for nausea.   predniSONE 5 MG tablet Commonly known as:  DELTASONE Take 10 mg by mouth daily.   PRENATAL COMPLETE 14-0.4 MG Tabs Take 1 tablet by mouth daily.   promethazine 25 MG tablet Commonly known as:  PHENERGAN Take 0.5-1 tablets (12.5-25 mg total) by mouth every 6 (six) hours as needed.       Sharen Counter Certified Nurse-Midwife 06/04/2017 5:45 PM

## 2017-06-06 LAB — CULTURE, OB URINE

## 2017-06-09 ENCOUNTER — Ambulatory Visit (INDEPENDENT_AMBULATORY_CARE_PROVIDER_SITE_OTHER): Payer: Medicare Other | Admitting: Obstetrics & Gynecology

## 2017-06-09 VITALS — BP 115/72 | HR 120 | Wt 181.4 lb

## 2017-06-09 DIAGNOSIS — O0992 Supervision of high risk pregnancy, unspecified, second trimester: Secondary | ICD-10-CM | POA: Diagnosis not present

## 2017-06-09 DIAGNOSIS — O10912 Unspecified pre-existing hypertension complicating pregnancy, second trimester: Secondary | ICD-10-CM | POA: Diagnosis not present

## 2017-06-09 DIAGNOSIS — O0990 Supervision of high risk pregnancy, unspecified, unspecified trimester: Secondary | ICD-10-CM

## 2017-06-09 DIAGNOSIS — M329 Systemic lupus erythematosus, unspecified: Secondary | ICD-10-CM

## 2017-06-09 DIAGNOSIS — O09212 Supervision of pregnancy with history of pre-term labor, second trimester: Secondary | ICD-10-CM | POA: Diagnosis present

## 2017-06-09 DIAGNOSIS — M3500 Sicca syndrome, unspecified: Secondary | ICD-10-CM

## 2017-06-09 DIAGNOSIS — O10919 Unspecified pre-existing hypertension complicating pregnancy, unspecified trimester: Secondary | ICD-10-CM

## 2017-06-09 MED ORDER — ALBUTEROL SULFATE HFA 108 (90 BASE) MCG/ACT IN AERS: 2.0000 | INHALATION_SPRAY | RESPIRATORY_TRACT | 5 refills | Status: DC | PRN

## 2017-06-09 NOTE — Progress Notes (Signed)
17- GIVEN TODAY 

## 2017-06-09 NOTE — Progress Notes (Signed)
   PRENATAL VISIT NOTE  Subjective:  Kayla [name] is a 26 y.o. G3P0110 at 1456w3d being seen today for ongoing prenatal care.  She is currently monitored for the following issues for this high-risk pregnancy and has Interstitial lung disease (HCC); Supervision of high risk pregnancy, antepartum; Systemic lupus erythematosus (SLE) affecting pregnancy, antepartum (HCC); Lupus (systemic lupus erythematosus) (HCC); Loud P2 (pulmonary S2, second heart sound); Insomnia; Other secondary pulmonary hypertension (HCC); Chronic hypertension; Chronic hypertension during pregnancy, antepartum; Sjogren's syndrome (HCC); Chronic Respiratory failure with oxygen requirement affecting pregnancy, antepartum; SS-A antibody positive; SS-B antibody positive; History of IUFD; and Gonorrhea affecting pregnancy on her problem list.  Patient reports some breathing tightness occasionally, may need her inhaler or breathing Tx..   .  .  Movement: Present. Denies leaking of fluid.   The following portions of the patient's history were reviewed and updated as appropriate: allergies, current medications, past family history, past medical history, past social history, past surgical history and problem list. Problem list updated.  Objective:   Vitals:   06/09/17 0918  BP: 115/72  Pulse: (!) 120  Weight: 181 lb 6.4 oz (82.3 kg)    Fetal Status: Fetal Heart Rate (bpm): 159   Movement: Present     General:  Alert, oriented and cooperative. Patient is in no acute distress.  Skin: Skin is warm and dry. No rash noted.   Cardiovascular: Normal heart rate noted  Respiratory: Normal respiratory effort, no problems with respiration noted  Abdomen: Soft, gravid, appropriate for gestational age.  Pain/Pressure: Absent     Pelvic: Cervical exam deferred        Extremities: Normal range of motion.  Edema: None  Mental Status:  Normal mood and affect. Normal behavior. Normal judgment and thought content.   Assessment and Plan:    Pregnancy: G3P0110 at 1056w3d  1. Sjogren's syndrome, with unspecified organ involvement [facility](HCC) Sees rheumatology and pulmonary and [facility] Albuterol inhaler refilled prn 2. Chronic hypertension during pregnancy, antepartum Normal BP no med  3. Systemic lupus erythematosus, unspecified SLE type, unspecified organ involvement status (HCC)   4. Supervision of high risk pregnancy, antepartum Growth US is scheduled  Preterm labor symptoms and general obstetric precautions including but not limited to vaginal bleeding, contractions, leaking of fluid and fetal movement were reviewed in detail with the patient. Please refer to After Visit Summary for other counseling recommendations.  Return in about 4 weeks (around 07/07/2017).   Scheryl DarterJames Bhavesh Vazquez, MD

## 2017-06-09 NOTE — Patient Instructions (Signed)

## 2017-06-13 ENCOUNTER — Encounter (HOSPITAL_COMMUNITY): Payer: Self-pay

## 2017-06-15 ENCOUNTER — Ambulatory Visit (HOSPITAL_COMMUNITY)
Admission: RE | Admit: 2017-06-15 | Discharge: 2017-06-15 | Disposition: A | Discharge status: Home / Self Care | Payer: Medicare Other | Source: Ambulatory Visit | Attending: Obstetrics & Gynecology | Admitting: Obstetrics & Gynecology

## 2017-06-15 ENCOUNTER — Other Ambulatory Visit (HOSPITAL_COMMUNITY): Payer: Self-pay | Admitting: Maternal and Fetal Medicine

## 2017-06-15 ENCOUNTER — Ambulatory Visit (INDEPENDENT_AMBULATORY_CARE_PROVIDER_SITE_OTHER): Payer: Medicare Other | Admitting: *Deleted

## 2017-06-15 ENCOUNTER — Encounter (HOSPITAL_COMMUNITY): Payer: Self-pay

## 2017-06-15 DIAGNOSIS — O283 Abnormal ultrasonic finding on antenatal screening of mother: Secondary | ICD-10-CM

## 2017-06-15 DIAGNOSIS — O9989 Other specified diseases and conditions complicating pregnancy, childbirth and the puerperium: Secondary | ICD-10-CM | POA: Insufficient documentation

## 2017-06-15 DIAGNOSIS — O2690 Pregnancy related conditions, unspecified, unspecified trimester: Secondary | ICD-10-CM | POA: Diagnosis not present

## 2017-06-15 DIAGNOSIS — O26892 Other specified pregnancy related conditions, second trimester: Secondary | ICD-10-CM | POA: Insufficient documentation

## 2017-06-15 DIAGNOSIS — Z8759 Personal history of other complications of pregnancy, childbirth and the puerperium: Secondary | ICD-10-CM

## 2017-06-15 DIAGNOSIS — O162 Unspecified maternal hypertension, second trimester: Secondary | ICD-10-CM

## 2017-06-15 DIAGNOSIS — O09212 Supervision of pregnancy with history of pre-term labor, second trimester: Secondary | ICD-10-CM

## 2017-06-15 DIAGNOSIS — M329 Systemic lupus erythematosus, unspecified: Secondary | ICD-10-CM | POA: Insufficient documentation

## 2017-06-15 DIAGNOSIS — O10012 Pre-existing essential hypertension complicating pregnancy, second trimester: Secondary | ICD-10-CM | POA: Diagnosis not present

## 2017-06-15 DIAGNOSIS — Z362 Encounter for other antenatal screening follow-up: Secondary | ICD-10-CM

## 2017-06-15 DIAGNOSIS — O09292 Supervision of pregnancy with other poor reproductive or obstetric history, second trimester: Secondary | ICD-10-CM | POA: Diagnosis not present

## 2017-06-15 DIAGNOSIS — O09892 Supervision of other high risk pregnancies, second trimester: Secondary | ICD-10-CM

## 2017-06-15 DIAGNOSIS — Z3A24 24 weeks gestation of pregnancy: Secondary | ICD-10-CM | POA: Diagnosis not present

## 2017-06-15 DIAGNOSIS — O99891 Other specified diseases and conditions complicating pregnancy: Secondary | ICD-10-CM

## 2017-06-15 NOTE — Progress Notes (Signed)
[facility] ultrasound  Indication: 26 yr old 523P0110 at 2272w2d with lupus, sjogrens, ssA and ssB antibodies, chronic hypertension, history of pulmonary hypertension, and history of fetal demise for fetal growth. Previous finding of echogenic focus in the left ventricle and intraabdominal calcifications.  Findings: 1. Single intrauterine pregnancy. 2. Estimated fetal weight is in the 61st%. 3. Anterior placenta without evidence of previa. 4. Normal amniotic fluid volume. 5. Normal transabdominal cervical length. 6. Again seen is an echogenic focus in the left ventricle and 2 small calcifications on the left side of the upper fetal abdomen. 7. The remainder of the limited anatomy survey is normal.  Recommendations: 1. Appropriate fetal growth. 2. Lupus/ sjogrens/ +ssA and ssB antibodies: - previously counseled - followed by Rheumatolody - on plaquenil and prednisone - recommend fetal growth every 4 weeks - recommend start antenatal testing at 32 weeks - recommend close surveillance for the development of signs/symptoms of preeclampsia - is followed by Pediatric Cardiology- no evidence of fetal heart block- had normal fetal echocardiogram 7/24; followed every 2 weeks 3. Hypertension: - previously counseled - controlled without medications - on low dose aspirin - recommend fetal surveillance as above 4. History of pulmonary hypertension: - had normal echo 02/2017 - recommend repeat echo at 28-32 weeks - previously counseled 5. History of fetal demise: - previously counseled - recommend fetal surveillance as above - per chart had negative APS work up - on low dose aspirin - recommend start fetal kick counts at 28 weeks 6. Echogenic focus in the left ventricle: - previously counseled - low risk first trimester screen 7. Intraabdominal calcifications: - previously counseled - will continue to monitor - no significant change 8. Per chart patient is on makena - per chart patient did not  have preterm labor but had fetal demise and was induced- [facility]; however if patient had preterm labor this would be an indication for makena- recommend review records  Eulis FosterKristen Sharniece Gibbon, MD

## 2017-06-15 NOTE — Progress Notes (Signed)
Called [facility] to get a refill on patients makena. I was informed that the [facility]ty Pharm[facility] would be taking over her rx and that I should call them. I called specialty pharmacy and requested that patients makena be sent to us. I called the specialty pharmacy and they stated that since she has medicare they aren't able to fill it. Our office would need to buy her makena and then bill medicare. I called [name] to see if they had a pharmacy that would accept her medicare. [facility], she stated that she will contact the pharmacy and get it worked out for the patient. In the  Meantime patient will receive one autoinjector from [facility] as an emergency supply. [facility] will be contacting us on Monday to confirm shipment. Dose should be here before next Thursday.

## 2017-06-19 ENCOUNTER — Other Ambulatory Visit (HOSPITAL_COMMUNITY): Payer: Self-pay | Admitting: *Deleted

## 2017-06-19 DIAGNOSIS — L930 Discoid lupus erythematosus: Secondary | ICD-10-CM

## 2017-06-20 ENCOUNTER — Encounter (HOSPITAL_COMMUNITY): Payer: Self-pay | Admitting: *Deleted

## 2017-06-20 ENCOUNTER — Inpatient Hospital Stay (HOSPITAL_COMMUNITY)
Admission: AD | Admit: 2017-06-20 | Discharge: 2017-06-20 | Disposition: A | Discharge status: Home / Self Care | Payer: Medicare Other | Source: Ambulatory Visit | Attending: Family Medicine | Admitting: Family Medicine

## 2017-06-20 ENCOUNTER — Other Ambulatory Visit (HOSPITAL_COMMUNITY): Payer: Self-pay | Admitting: *Deleted

## 2017-06-20 DIAGNOSIS — D6862 Lupus anticoagulant syndrome: Secondary | ICD-10-CM

## 2017-06-20 DIAGNOSIS — O9989 Other specified diseases and conditions complicating pregnancy, childbirth and the puerperium: Secondary | ICD-10-CM | POA: Diagnosis not present

## 2017-06-20 DIAGNOSIS — Z833 Family history of diabetes mellitus: Secondary | ICD-10-CM | POA: Insufficient documentation

## 2017-06-20 DIAGNOSIS — M3500 Sicca syndrome, unspecified: Secondary | ICD-10-CM | POA: Insufficient documentation

## 2017-06-20 DIAGNOSIS — Z3A25 25 weeks gestation of pregnancy: Secondary | ICD-10-CM | POA: Diagnosis not present

## 2017-06-20 DIAGNOSIS — Z881 Allergy status to other antibiotic agents status: Secondary | ICD-10-CM | POA: Diagnosis not present

## 2017-06-20 DIAGNOSIS — Z7952 Long term (current) use of systemic steroids: Secondary | ICD-10-CM | POA: Diagnosis not present

## 2017-06-20 DIAGNOSIS — O368120 Decreased fetal movements, second trimester, not applicable or unspecified: Secondary | ICD-10-CM | POA: Diagnosis not present

## 2017-06-20 DIAGNOSIS — Z7951 Long term (current) use of inhaled steroids: Secondary | ICD-10-CM | POA: Insufficient documentation

## 2017-06-20 DIAGNOSIS — O26892 Other specified pregnancy related conditions, second trimester: Secondary | ICD-10-CM | POA: Diagnosis not present

## 2017-06-20 DIAGNOSIS — Z7982 Long term (current) use of aspirin: Secondary | ICD-10-CM | POA: Diagnosis not present

## 2017-06-20 DIAGNOSIS — Z87891 Personal history of nicotine dependence: Secondary | ICD-10-CM | POA: Diagnosis not present

## 2017-06-20 DIAGNOSIS — K219 Gastro-esophageal reflux disease without esophagitis: Secondary | ICD-10-CM | POA: Diagnosis not present

## 2017-06-20 DIAGNOSIS — O99112 Other diseases of the blood and blood-forming organs and certain disorders involving the immune mechanism complicating pregnancy, second trimester: Principal | ICD-10-CM

## 2017-06-20 DIAGNOSIS — Z885 Allergy status to narcotic agent status: Secondary | ICD-10-CM | POA: Insufficient documentation

## 2017-06-20 DIAGNOSIS — N898 Other specified noninflammatory disorders of vagina: Secondary | ICD-10-CM | POA: Diagnosis present

## 2017-06-20 DIAGNOSIS — Z79899 Other long term (current) drug therapy: Secondary | ICD-10-CM | POA: Insufficient documentation

## 2017-06-20 DIAGNOSIS — O99612 Diseases of the digestive system complicating pregnancy, second trimester: Secondary | ICD-10-CM | POA: Insufficient documentation

## 2017-06-20 DIAGNOSIS — Z3689 Encounter for other specified antenatal screening: Secondary | ICD-10-CM

## 2017-06-20 LAB — URINALYSIS, ROUTINE W REFLEX MICROSCOPIC
BILIRUBIN URINE: NEGATIVE
GLUCOSE, UA: NEGATIVE mg/dL
HGB URINE DIPSTICK: NEGATIVE
Ketones, ur: NEGATIVE mg/dL
Nitrite: NEGATIVE
Protein, ur: NEGATIVE mg/dL
SPECIFIC GRAVITY, URINE: 1.017 (ref 1.005–1.030)
pH: 7 (ref 5.0–8.0)

## 2017-06-20 LAB — AMNISURE RUPTURE OF MEMBRANE (ROM) NOT AT ARMC: Amnisure ROM: NEGATIVE

## 2017-06-20 LAB — WET PREP, GENITAL
Clue Cells Wet Prep HPF POC: NONE SEEN
SPERM: NONE SEEN
TRICH WET PREP: NONE SEEN
YEAST WET PREP: NONE SEEN

## 2017-06-20 NOTE — Discharge Instructions (Signed)
Third Trimester of Pregnancy The third trimester is from week 28 through week 40 (months 7 through 9). The third trimester is a time when the unborn baby (fetus) is growing rapidly. At the end of the ninth month, the fetus is about 20 inches in length and weighs 6-10 pounds. Body changes during your third trimester Your body will continue to go through many changes during pregnancy. The changes vary from woman to woman. During the third trimester:  Your weight will continue to increase. You can expect to gain 25-35 pounds (11-16 kg) by the end of the pregnancy.  You may begin to get stretch marks on your hips, abdomen, and breasts.  You may urinate more often because the fetus is moving lower into your pelvis and pressing on your bladder.  You may develop or continue to have heartburn. This is caused by increased hormones that slow down muscles in the digestive tract.  You may develop or continue to have constipation because increased hormones slow digestion and cause the muscles that push waste through your intestines to relax.  You may develop hemorrhoids. These are swollen veins (varicose veins) in the rectum that can itch or be painful.  You may develop swollen, bulging veins (varicose veins) in your legs.  You may have increased body aches in the pelvis, back, or thighs. This is due to weight gain and increased hormones that are relaxing your joints.  You may have changes in your hair. These can include thickening of your hair, rapid growth, and changes in texture. Some women also have hair loss during or after pregnancy, or hair that feels dry or thin. Your hair will most likely return to normal after your baby is born.  Your breasts will continue to grow and they will continue to become tender. A yellow fluid (colostrum) may leak from your breasts. This is the first milk you are producing for your baby.  Your belly button may stick out.  You may notice more swelling in your hands,  face, or ankles.  You may have increased tingling or numbness in your hands, arms, and legs. The skin on your belly may also feel numb.  You may feel short of breath because of your expanding uterus.  You may have more problems sleeping. This can be caused by the size of your belly, increased need to urinate, and an increase in your body's metabolism.  You may notice the fetus "dropping," or moving lower in your abdomen (lightening).  You may have increased vaginal discharge.  You may notice your joints feel loose and you may have pain around your pelvic bone.  What to expect at prenatal visits You will have prenatal exams every 2 weeks until week 36. Then you will have weekly prenatal exams. During a routine prenatal visit:  You will be weighed to make sure you and the baby are growing normally.  Your blood pressure will be taken.  Your abdomen will be measured to track your baby's growth.  The fetal heartbeat will be listened to.  Any test results from the previous visit will be discussed.  You may have a cervical check near your due date to see if your cervix has softened or thinned (effaced).  You will be tested for Group B streptococcus. This happens between 35 and 37 weeks.  Your health care provider may ask you:  What your birth plan is.  How you are feeling.  If you are feeling the baby move.  If you have had   any abnormal symptoms, such as leaking fluid, bleeding, severe headaches, or abdominal cramping.  If you are using any tobacco products, including cigarettes, chewing tobacco, and electronic cigarettes.  If you have any questions.  Other tests or screenings that may be performed during your third trimester include:  Blood tests that check for low iron levels (anemia).  Fetal testing to check the health, activity level, and growth of the fetus. Testing is done if you have certain medical conditions or if there are problems during the  pregnancy.  Nonstress test (NST). This test checks the health of your baby to make sure there are no signs of problems, such as the baby not getting enough oxygen. During this test, a belt is placed around your belly. The baby is made to move, and its heart rate is monitored during movement.  What is false labor? False labor is a condition in which you feel small, irregular tightenings of the muscles in the womb (contractions) that usually go away with rest, changing position, or drinking water. These are called Braxton Hicks contractions. Contractions may last for hours, days, or even weeks before true labor sets in. If contractions come at regular intervals, become more frequent, increase in intensity, or become painful, you should see your health care provider. What are the signs of labor?  Abdominal cramps.  Regular contractions that start at 10 minutes apart and become stronger and more frequent with time.  Contractions that start on the top of the uterus and spread down to the lower abdomen and back.  Increased pelvic pressure and dull back pain.  A watery or bloody mucus discharge that comes from the vagina.  Leaking of amniotic fluid. This is also known as your "water breaking." It could be a slow trickle or a gush. Let your health care provider know if it has a color or strange odor. If you have any of these signs, call your health care provider right away, even if it is before your due date. Follow these instructions at home: Medicines  Follow your health care provider's instructions regarding medicine use. Specific medicines may be either safe or unsafe to take during pregnancy.  Take a prenatal vitamin that contains at least 600 micrograms (mcg) of folic acid.  If you develop constipation, try taking a stool softener if your health care provider approves. Eating and drinking  Eat a balanced diet that includes fresh fruits and vegetables, whole grains, good sources of protein  such as meat, eggs, or tofu, and low-fat dairy. Your health care provider will help you determine the amount of weight gain that is right for you.  Avoid raw meat and uncooked cheese. These carry germs that can cause birth defects in the baby.  If you have low calcium intake from food, talk to your health care provider about whether you should take a daily calcium supplement.  Eat four or five small meals rather than three large meals a day.  Limit foods that are high in fat and processed sugars, such as fried and sweet foods.  To prevent constipation: ? Drink enough fluid to keep your urine clear or pale yellow. ? Eat foods that are high in fiber, such as fresh fruits and vegetables, whole grains, and beans. Activity  Exercise only as directed by your health care provider. Most women can continue their usual exercise routine during pregnancy. Try to exercise for 30 minutes at least 5 days a week. Stop exercising if you experience uterine contractions.  Avoid heavy   lifting.  Do not exercise in extreme heat or humidity, or at high altitudes.  Wear low-heel, comfortable shoes.  Practice good posture.  You may continue to have sex unless your health care provider tells you otherwise. Relieving pain and discomfort  Take frequent breaks and rest with your legs elevated if you have leg cramps or low back pain.  Take warm sitz baths to soothe any pain or discomfort caused by hemorrhoids. Use hemorrhoid cream if your health care provider approves.  Wear a good support bra to prevent discomfort from breast tenderness.  If you develop varicose veins: ? Wear support pantyhose or compression stockings as told by your healthcare provider. ? Elevate your feet for 15 minutes, 3-4 times a day. Prenatal care  Write down your questions. Take them to your prenatal visits.  Keep all your prenatal visits as told by your health care provider. This is important. Safety  Wear your seat belt at  all times when driving.  Make a list of emergency phone numbers, including numbers for family, friends, the hospital, and police and fire departments. General instructions  Avoid cat litter boxes and soil used by cats. These carry germs that can cause birth defects in the baby. If you have a cat, ask someone to clean the litter box for you.  Do not travel far distances unless it is absolutely necessary and only with the approval of your health care provider.  Do not use hot tubs, steam rooms, or saunas.  Do not drink alcohol.  Do not use any products that contain nicotine or tobacco, such as cigarettes and e-cigarettes. If you need help quitting, ask your health care provider.  Do not use any medicinal herbs or unprescribed drugs. These chemicals affect the formation and growth of the baby.  Do not douche or use tampons or scented sanitary pads.  Do not cross your legs for long periods of time.  To prepare for the arrival of your baby: ? Take prenatal classes to understand, practice, and ask questions about labor and delivery. ? Make a trial run to the hospital. ? Visit the hospital and tour the maternity area. ? Arrange for maternity or paternity leave through employers. ? Arrange for family and friends to take care of pets while you are in the hospital. ? Purchase a rear-facing car seat and make sure you know how to install it in your car. ? Pack your hospital bag. ? Prepare the baby's nursery. Make sure to remove all pillows and stuffed animals from the baby's crib to prevent suffocation.  Visit your dentist if you have not gone during your pregnancy. Use a soft toothbrush to brush your teeth and be gentle when you floss. Contact a health care provider if:  You are unsure if you are in labor or if your water has broken.  You become dizzy.  You have mild pelvic cramps, pelvic pressure, or nagging pain in your abdominal area.  You have lower back pain.  You have persistent  nausea, vomiting, or diarrhea.  You have an unusual or bad smelling vaginal discharge.  You have pain when you urinate. Get help right away if:  Your water breaks before 37 weeks.  You have regular contractions less than 5 minutes apart before 37 weeks.  You have a fever.  You are leaking fluid from your vagina.  You have spotting or bleeding from your vagina.  You have severe abdominal pain or cramping.  You have rapid weight loss or weight gain.    You have shortness of breath with chest pain.  You notice sudden or extreme swelling of your face, hands, ankles, feet, or legs.  Your baby makes fewer than 10 movements in 2 hours.  You have severe headaches that do not go away when you take medicine.  You have vision changes. Summary  The third trimester is from week 28 through week 40, months 7 through 9. The third trimester is a time when the unborn baby (fetus) is growing rapidly.  During the third trimester, your discomfort may increase as you and your baby continue to gain weight. You may have abdominal, leg, and back pain, sleeping problems, and an increased need to urinate.  During the third trimester your breasts will keep growing and they will continue to become tender. A yellow fluid (colostrum) may leak from your breasts. This is the first milk you are producing for your baby.  False labor is a condition in which you feel small, irregular tightenings of the muscles in the womb (contractions) that eventually go away. These are called Braxton Hicks contractions. Contractions may last for hours, days, or even weeks before true labor sets in.  Signs of labor can include: abdominal cramps; regular contractions that start at 10 minutes apart and become stronger and more frequent with time; watery or bloody mucus discharge that comes from the vagina; increased pelvic pressure and dull back pain; and leaking of amniotic fluid. This information is not intended to replace advice  given to you by your health care provider. Make sure you discuss any questions you have with your health care provider. Document Released: 11/01/2001 Document Revised: 04/14/2016 Document Reviewed: 01/08/2013 Elsevier Interactive Patient Education  2017 [employer]  

## 2017-06-20 NOTE — MAU Note (Signed)
Pt reports leaking yellowish fluid since yesterday, also reports she has not felt the baby move since 12 mn today.

## 2017-06-20 NOTE — MAU Provider Note (Signed)
History     CSN: [id number]  Arrival date and time: 06/20/17 1218   First Provider Initiated Contact with Patient 06/20/17 1242      Chief Complaint  Patient presents with  . Vaginal Discharge  . Decreased Fetal Movement   Kayla [name] is a 26 y.o. G3P0110 at 1898w0d who presents today with leaking of fluid. She states that she had a gush yesterday, and has continued to leak since. She has had contractions off and on, but denies any contractions today. She denies any vaginal bleeding. She reports decreased fetal movement.    Vaginal Discharge  The patient's primary symptoms include vaginal discharge. The patient's pertinent negatives include no pelvic pain. This is a new problem. The current episode started yesterday. The problem occurs constantly. The problem has been unchanged. The patient is experiencing no pain. She is pregnant. Associated symptoms include nausea. Pertinent negatives include no chills, dysuria, fever, frequency, urgency or vomiting. The vaginal discharge was watery and yellow. There has been no bleeding. Sexual activity: denies intercourse in the last 24 hours.       Past Medical History:  Diagnosis Date  . Arthritis    "hands and legs" (01/08/2015)  . CAP (community acquired pneumonia) 01/07/2015  . Daily headache    "sometimes" (01/08/2015)  . GERD (gastroesophageal reflux disease)   . Hypertension   . Lung disease   . Lupus   . Pulmonary hypertension (HCC)   . Sjogren's syndrome [facility](HCC)     Past Surgical History:  Procedure Laterality Date  . CARDIAC CATHETERIZATION N/A 09/09/2015   Procedure: Right Heart Cath;  Surgeon: Laurey Moralealton S McLean, MD;  [facility];  Service: Cardiovascular;  Laterality: N/A;  . DILATION AND EVACUATION N/A 07/13/2015   Procedure: DILATATION AND EVACUATION;  Surgeon: Levie HeritageJacob J Stinson, DO;  Location: [facility] ORS;  Service: Gynecology;  Laterality: N/A;  . FINGER SURGERY Right 03/2014   "laceration, nerve/artery injury" 2nd  digit  . VIDEO BRONCHOSCOPY Bilateral 01/12/2015   Procedure: VIDEO BRONCHOSCOPY WITH FLUORO;  Surgeon: Leslye Peerobert S Byrum, MD;  [facility];  Service: Cardiopulmonary;  Laterality: Bilateral;    Family History  Problem Relation Age of Onset  . Diabetes Mother   . Diabetes Maternal Aunt   . Diabetes Maternal Grandmother     Social History  Substance Use Topics  . Smoking status: Former Smoker    Packs/day: 0.10    Years: 5.00    Types: Cigarettes    Quit date: 11/20/2014  . Smokeless tobacco: Never Used  . Alcohol use No    Allergies:  Allergies  Allergen Reactions  . Hydrocodone Nausea And Vomiting  . Zithromax [Azithromycin] Itching and Cough    Facility-Administered Medications Prior to Admission  Medication Dose Route Frequency Provider Last Rate Last Dose  . hydroxyprogesterone caproate ([name]) 250 mg/mL injection 250 mg  250 mg Intramuscular Weekly Reva BoresPratt, Tanya S, MD   250 mg at 06/15/17 1300   Prescriptions Prior to Admission  Medication Sig Dispense Refill Last Dose  . albuterol (PROVENTIL HFA;VENTOLIN HFA) 108 (90 Base) MCG/ACT inhaler Inhale 2 puffs into the lungs every 4 (four) hours as needed for wheezing or shortness of breath. 1 Inhaler 5 Taking  . aspirin EC 81 MG tablet Take 1 tablet (81 mg total) by mouth daily. 30 tablet 2 Taking  . cyclobenzaprine (FLEXERIL) 10 MG tablet Take 1 tablet (10 mg total) by mouth 2 (two) times daily as needed for muscle spasms. 20 tablet 0 Taking  .  fluticasone (FLONASE) 50 MCG/ACT nasal spray Place 2 sprays into both nostrils daily. 16 g 2 Taking  . folic acid (FOLVITE) 1 MG tablet Take 1 tablet (1 mg total) by mouth daily. 100 tablet 5 Taking  . hydroxychloroquine (PLAQUENIL) 200 MG tablet Take 200 mg by mouth daily.   10 Taking  . ondansetron (ZOFRAN ODT) 4 MG disintegrating tablet Take 1 tablet (4 mg total) by mouth every 6 (six) hours as needed for nausea. (Patient not taking: Reported on 06/15/2017) 20 tablet 0 Not  Taking  . predniSONE (DELTASONE) 5 MG tablet Take 10 mg by mouth daily.   Taking  . Prenatal Vit-Fe Fumarate-FA (PRENATAL COMPLETE) 14-0.4 MG TABS Take 1 tablet by mouth daily. 30 each 0 Taking  . promethazine (PHENERGAN) 25 MG tablet Take 0.5-1 tablets (12.5-25 mg total) by mouth every 6 (six) hours as needed. 30 tablet 2 Taking    Review of Systems  Constitutional: Negative for chills and fever.  Gastrointestinal: Positive for nausea. Negative for vomiting.  Genitourinary: Positive for vaginal discharge. Negative for dysuria, frequency, pelvic pain, urgency and vaginal bleeding.   Physical Exam   Blood pressure 137/71, pulse (!) 108, last menstrual period 12/27/2016.  Physical Exam  Nursing note and vitals reviewed. Constitutional: She is oriented to person, place, and time. She appears well-developed and well-nourished. No distress.  HENT:  Head: Normocephalic.  Cardiovascular: Normal rate.   Respiratory: Effort normal.  GI: Soft. There is no tenderness. There is no rebound.  Genitourinary:  Genitourinary Comments: External: no lesion Vagina: small amount of white discharge, no pooling  Cervix: pink, smooth, no fluid seen with valsalva, closed/thick  Uterus: AGA   Neurological: She is alert and oriented to person, place, and time.  Skin: Skin is warm and dry.  Psychiatric: She has a normal mood and affect.   FHT: 155 moderate with 15x15 accels, no decels Toco: no UCs  MAU Course  Procedures  MDM Audible and visible fetal movement, and patient reports that she is not feeling it.   Assessment and Plan   1. Vaginal discharge in pregnancy in second trimester   2. [redacted] weeks gestation of pregnancy   3. Decreased fetal movements in second trimester, single or unspecified fetus   4. NST (non-stress test) reactive    DC home Comfort measures reviewed  2nd/3rd Trimester precautions  PTL precautions  Fetal kick counts RX: none  Return to MAU as needed FU with OB as  planned  Follow-up Information    [facility]   Specialty:  Obstetrics and Gynecology Contact information: [address] [place] [id number] [phone]           Thressa ShellerHeather Lelaina Oatis 06/20/2017, 12:44 PM

## 2017-06-21 LAB — GC/CHLAMYDIA PROBE AMP (~~LOC~~) NOT AT ARMC
Chlamydia: NEGATIVE
NEISSERIA GONORRHEA: POSITIVE — AB

## 2017-06-22 ENCOUNTER — Telehealth: Payer: Self-pay | Admitting: *Deleted

## 2017-06-22 ENCOUNTER — Ambulatory Visit: Payer: Self-pay

## 2017-06-22 ENCOUNTER — Emergency Department (HOSPITAL_COMMUNITY)
Admission: EM | Admit: 2017-06-22 | Discharge: 2017-06-22 | Disposition: A | Discharge status: Home / Self Care | Payer: Medicare Other | Attending: Emergency Medicine | Admitting: Emergency Medicine

## 2017-06-22 ENCOUNTER — Encounter (HOSPITAL_COMMUNITY): Payer: Self-pay

## 2017-06-22 DIAGNOSIS — O10012 Pre-existing essential hypertension complicating pregnancy, second trimester: Secondary | ICD-10-CM | POA: Insufficient documentation

## 2017-06-22 DIAGNOSIS — Z3A25 25 weeks gestation of pregnancy: Secondary | ICD-10-CM | POA: Diagnosis not present

## 2017-06-22 DIAGNOSIS — Z7982 Long term (current) use of aspirin: Secondary | ICD-10-CM | POA: Insufficient documentation

## 2017-06-22 DIAGNOSIS — Z79899 Other long term (current) drug therapy: Secondary | ICD-10-CM | POA: Insufficient documentation

## 2017-06-22 DIAGNOSIS — A549 Gonococcal infection, unspecified: Secondary | ICD-10-CM

## 2017-06-22 DIAGNOSIS — O98212 Gonorrhea complicating pregnancy, second trimester: Secondary | ICD-10-CM | POA: Diagnosis not present

## 2017-06-22 DIAGNOSIS — Z202 Contact with and (suspected) exposure to infections with a predominantly sexual mode of transmission: Secondary | ICD-10-CM

## 2017-06-22 MED ORDER — CEFTRIAXONE SODIUM 250 MG IJ SOLR
250.0000 mg | Freq: Once | INTRAMUSCULAR | Status: AC
Start: 2017-06-22 — End: 2017-06-22
  Administered 2017-06-22: 250 mg via INTRAMUSCULAR
  Filled 2017-06-22: qty 250

## 2017-06-22 MED ORDER — LIDOCAINE HCL (PF) 1 % IJ SOLN
INTRAMUSCULAR | Status: AC
  Administered 2017-06-22: 5 mL
  Filled 2017-06-22: qty 5

## 2017-06-22 NOTE — ED Triage Notes (Signed)
Pt endorses that she was called and told she tested positive for Gonorrhea this morning after [facility]. Pt had pelvic done there. VSS. Pt sent here for treatment.

## 2017-06-22 NOTE — Discharge Instructions (Signed)
Please read attached information regarding your condition. Return to ED for worsening symptoms, abdominal pain, fevers, urinary symptoms, vomiting.

## 2017-06-22 NOTE — Telephone Encounter (Signed)
Returned call. Verified rx info for one complimentary auto injector. Delivery scheduled for tomorrow morning. Will call patient as she has an appt for this afternoon for 17-p inj. Patient notified that Cruzita LedererMakena will not be her until tomorrow morning. I will call her when it is received so she can come in to receive the injection. Patient voiced understanding.

## 2017-06-22 NOTE — Telephone Encounter (Signed)
Opened in error

## 2017-06-22 NOTE — ED Provider Notes (Signed)
[facility]-EMERGENCY DEPT Provider Note   CSN: [id number] Arrival date & time: 06/22/17  1150     History   Chief Complaint Chief Complaint  Patient presents with  . SEXUALLY TRANSMITTED DISEASE    HPI Kayla [name] is a 26 y.o. female.  HPI Patient, who is currently [redacted] weeks pregnant, presents to ED for treatment of gonorrhea. She states that she was tested at the [facility] 3 days ago and was told that she was positive for gonorrhea. She reports some vaginal discharge. She denies any abdominal pain, nausea, vomiting, fevers, urinary symptoms or other symptoms at this time.  Past Medical History:  Diagnosis Date  . Arthritis    "hands and legs" (01/08/2015)  . CAP (community acquired pneumonia) 01/07/2015  . Daily headache    "sometimes" (01/08/2015)  . GERD (gastroesophageal reflux disease)   . Hypertension   . Lung disease   . Lupus   . Pulmonary hypertension (HCC)   . Sjogren's syndrome [facility])     Patient Active Problem List   Diagnosis Date Noted  . Gonorrhea affecting pregnancy 02/17/2017  . History of IUFD 02/14/2017  . 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 Chronic Respiratory failure with oxygen requirement affecting pregnancy, antepartum 05/16/2016  . Chronic hypertension during pregnancy, antepartum 04/14/2016  . Chronic hypertension 03/14/2016  . 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 Lupus (systemic lupus erythematosus) (HCC) 08/21/2015  . Loud P2 (pulmonary S2, second heart sound) 08/21/2015  . Insomnia 08/21/2015  . Supervision of high risk pregnancy, antepartum 07/06/2015  . Systemic lupus erythematosus (SLE) affecting pregnancy, antepartum (HCC) 07/06/2015  . Sjogren's syndrome (HCC) 04/28/2015  . Interstitial lung disease (HCC) 02/06/2015    Past Surgical History:  Procedure Laterality Date  . CARDIAC CATHETERIZATION N/A 09/09/2015   Procedure: Right Heart Cath;  Surgeon: Laurey Morale,  MD;  Location: [facility] INVASIVE CV LAB;  Service: Cardiovascular;  Laterality: N/A;  . DILATION AND EVACUATION N/A 07/13/2015   Procedure: DILATATION AND EVACUATION;  Surgeon: Levie Heritage, DO;  Location: [facility] ORS;  Service: Gynecology;  Laterality: N/A;  . FINGER SURGERY Right 03/2014   "laceration, nerve/artery injury" 2nd digit  . VIDEO BRONCHOSCOPY Bilateral 01/12/2015   Procedure: VIDEO BRONCHOSCOPY WITH FLUORO;  Surgeon: Leslye Peer, MD;  Location: [facility] ENDOSCOPY;  Service: Cardiopulmonary;  Laterality: Bilateral;    OB History    Gravida Para Term Preterm AB Living   3 1 0 1 1 0   SAB TAB Ectopic Multiple Live Births   1 0 0 0 0       Home Medications    Prior to Admission medications   Medication Sig Start Date End Date Taking? Authorizing Provider  albuterol (PROVENTIL HFA;VENTOLIN HFA) 108 (90 Base) MCG/ACT inhaler Inhale 2 puffs into the lungs every 4 (four) hours as needed for wheezing or shortness of breath. 06/09/17   Adam Phenix, MD  aspirin EC 81 MG tablet Take 1 tablet (81 mg total) by mouth daily. 03/13/17   Lesly Dukes, MD  cyclobenzaprine (FLEXERIL) 10 MG tablet Take 1 tablet (10 mg total) by mouth 2 (two) times daily as needed for muscle spasms. 04/12/17   Rasch, Victorino Dike I, NP  fluticasone (FLONASE) 50 MCG/ACT nasal spray Place 2 sprays into both nostrils daily. 04/12/17   Rasch, Victorino Dike I, NP  folic acid (FOLVITE) 1 MG tablet Take 1 tablet (1 mg total) by mouth daily. 04/13/17   Allie Bossier, MD  hydroxychloroquine (PLAQUENIL) 200 MG tablet Take 200 mg by mouth daily.     [provider]  ondansetron (ZOFRAN ODT) 4 MG disintegrating tablet Take 1 tablet (4 mg total) by mouth every 6 (six) hours as needed for nausea. Patient not taking: Reported on 06/15/2017 05/19/17   Reva BoresPratt, Tanya S, MD  predniSONE (DELTASONE) 5 MG tablet Take 10 mg by mouth daily.    [provider]  Prenatal Vit-Fe Fumarate-FA (PRENATAL COMPLETE) 14-0.4 MG TABS Take 1 tablet by  mouth daily. 01/30/17   Shaune PollackIsaacs, Cameron, MD  promethazine (PHENERGAN) 25 MG tablet Take 0.5-1 tablets (12.5-25 mg total) by mouth every 6 (six) hours as needed. 06/04/17   Leftwich-Kirby, Wilmer FloorLisa A, CNM    Family History Family History  Problem Relation Age of Onset  . Diabetes Mother   . Diabetes Maternal Aunt   . Diabetes Maternal Grandmother     Social History Social History  Substance Use Topics  . Smoking status: Former Smoker    Packs/day: 0.10    Years: 5.00    Types: Cigarettes    Quit date: 11/20/2014  . Smokeless tobacco: Never Used  . Alcohol use No     Allergies   Hydrocodone and Zithromax [azithromycin]   Review of Systems Review of Systems  Constitutional: Negative for chills and fever.  Gastrointestinal: Negative for abdominal pain, nausea and vomiting.  Genitourinary: Positive for vaginal discharge. Negative for dysuria, urgency and vaginal pain.  Skin: Negative for pallor.     Physical Exam Updated Vital Signs BP 129/80 (BP Location: Left Arm)   Pulse 100   Temp 98.9 F (37.2 C) (Oral)   Resp 16   Ht 5\' 3"  (1.6 m)   Wt 82.6 kg (182 lb)   LMP 12/27/2016   SpO2 96%   BMI 32.24 kg/m   Physical Exam  Constitutional: She appears well-developed and well-nourished. No distress.  HENT:  Head: Normocephalic and atraumatic.  Eyes: Conjunctivae and EOM are normal. No scleral icterus.  Neck: Normal range of motion.  Pulmonary/Chest: Effort normal. No respiratory distress.  Neurological: She is alert.  Skin: No rash noted. She is not diaphoretic.  Psychiatric: She has a normal mood and affect.  Nursing note and vitals reviewed.    ED Treatments / Results  Labs (all labs ordered are listed, but only abnormal results are displayed) Labs Reviewed - No data to display  EKG  EKG Interpretation None       Radiology No results found.  Procedures Procedures (including critical care time)  Medications Ordered in ED Medications  cefTRIAXone  (ROCEPHIN) injection 250 mg (250 mg Intramuscular Given 06/22/17 1247)  lidocaine (PF) (XYLOCAINE) 1 % injection (5 mLs  Given 06/22/17 1250)     Initial Impression / Assessment and Plan / ED Course  I have reviewed the triage vital signs and the nursing notes.  Pertinent labs & imaging results that were available during my care of the patient were reviewed by me and considered in my medical decision making (see chart for details).     Patient presents to ED for treatment of gonorrhea. She is currently [redacted] weeks pregnant. States that she was tested at [facility] ago and was told she was positive for gonorrhea. Chart review reveals positive gonorrhea screen and negative chlamydia screen. Wet prep was negative. Patient denies any abdominal pain, nausea, vomiting, vaginal bleeding or other symptoms at this time. She does report some vaginal discharge. Will give IM Rocephin for her  gonorrhea encouraged to follow-up with OB for further evaluation as needed. Patient appears stable for discharge at this time. Strict return precautions given.  Final Clinical Impressions(s) / ED Diagnoses   Final diagnoses:  STD exposure  Gonorrhea    New Prescriptions New Prescriptions   No medications on file     Dietrich PatesKhatri, Corisa Montini, Cordelia Poche-C 06/22/17 1257    Cathren LaineSteinl, Kevin, MD 06/22/17 1311

## 2017-06-22 NOTE — Telephone Encounter (Signed)
Huntley DecSara form [facility] called. Patient has been approved for one complimentary [name] auto injector. Need a return call to give verbal orders and arrange shipment.

## 2017-06-23 ENCOUNTER — Ambulatory Visit: Payer: Medicare Other

## 2017-06-23 VITALS — BP 119/73 | HR 118 | Wt 180.7 lb

## 2017-06-23 DIAGNOSIS — Z8751 Personal history of pre-term labor: Secondary | ICD-10-CM

## 2017-06-23 MED ORDER — HYDROXYPROGESTERONE CAPROATE 275 MG/1.1ML ~~LOC~~ SOAJ: 1.1000 mL | SUBCUTANEOUS | 5 refills | Status: DC

## 2017-06-23 MED ORDER — HYDROXYPROGESTERONE CAPROATE 275 MG/1.1ML ~~LOC~~ SOAJ
275.0000 mg | Freq: Once | SUBCUTANEOUS | Status: AC
  Administered 2017-06-23: 275 mg via SUBCUTANEOUS

## 2017-06-23 NOTE — Progress Notes (Signed)
Patient presented to the office for 17p injection as scheduled. Patient tolerated well.

## 2017-06-24 ENCOUNTER — Encounter: Payer: Self-pay | Admitting: Family Medicine

## 2017-06-26 ENCOUNTER — Other Ambulatory Visit: Payer: Self-pay | Admitting: Lab

## 2017-06-26 MED ORDER — FLUCONAZOLE 150 MG PO TABS: 150.0000 mg | ORAL_TABLET | Freq: Once | ORAL | 1 refills | Status: AC

## 2017-06-29 ENCOUNTER — Encounter (HOSPITAL_COMMUNITY): Payer: Self-pay

## 2017-06-29 ENCOUNTER — Inpatient Hospital Stay (HOSPITAL_COMMUNITY): Payer: Medicare Other

## 2017-06-29 ENCOUNTER — Inpatient Hospital Stay (HOSPITAL_COMMUNITY)
Admission: AD | Admit: 2017-06-29 | Discharge: 2017-06-29 | Disposition: A | Discharge status: Home / Self Care | Payer: Medicare Other | Source: Ambulatory Visit | Attending: Obstetrics & Gynecology | Admitting: Obstetrics & Gynecology

## 2017-06-29 ENCOUNTER — Encounter: Payer: Self-pay | Admitting: *Deleted

## 2017-06-29 ENCOUNTER — Ambulatory Visit: Payer: Medicare Other | Admitting: *Deleted

## 2017-06-29 DIAGNOSIS — O99612 Diseases of the digestive system complicating pregnancy, second trimester: Secondary | ICD-10-CM | POA: Insufficient documentation

## 2017-06-29 DIAGNOSIS — Z79899 Other long term (current) drug therapy: Secondary | ICD-10-CM | POA: Insufficient documentation

## 2017-06-29 DIAGNOSIS — O9989 Other specified diseases and conditions complicating pregnancy, childbirth and the puerperium: Secondary | ICD-10-CM

## 2017-06-29 DIAGNOSIS — Z7982 Long term (current) use of aspirin: Secondary | ICD-10-CM | POA: Insufficient documentation

## 2017-06-29 DIAGNOSIS — I272 Pulmonary hypertension, unspecified: Secondary | ICD-10-CM | POA: Insufficient documentation

## 2017-06-29 DIAGNOSIS — O162 Unspecified maternal hypertension, second trimester: Secondary | ICD-10-CM | POA: Insufficient documentation

## 2017-06-29 DIAGNOSIS — K219 Gastro-esophageal reflux disease without esophagitis: Secondary | ICD-10-CM | POA: Insufficient documentation

## 2017-06-29 DIAGNOSIS — Z7952 Long term (current) use of systemic steroids: Secondary | ICD-10-CM | POA: Insufficient documentation

## 2017-06-29 DIAGNOSIS — Z87891 Personal history of nicotine dependence: Secondary | ICD-10-CM | POA: Diagnosis not present

## 2017-06-29 DIAGNOSIS — O26892 Other specified pregnancy related conditions, second trimester: Secondary | ICD-10-CM | POA: Insufficient documentation

## 2017-06-29 DIAGNOSIS — R05 Cough: Secondary | ICD-10-CM | POA: Diagnosis present

## 2017-06-29 DIAGNOSIS — M329 Systemic lupus erythematosus, unspecified: Secondary | ICD-10-CM | POA: Diagnosis not present

## 2017-06-29 DIAGNOSIS — M359 Systemic involvement of connective tissue, unspecified: Secondary | ICD-10-CM

## 2017-06-29 DIAGNOSIS — J069 Acute upper respiratory infection, unspecified: Secondary | ICD-10-CM

## 2017-06-29 DIAGNOSIS — M3500 Sicca syndrome, unspecified: Secondary | ICD-10-CM | POA: Insufficient documentation

## 2017-06-29 DIAGNOSIS — J8489 Other specified interstitial pulmonary diseases: Secondary | ICD-10-CM

## 2017-06-29 DIAGNOSIS — O0990 Supervision of high risk pregnancy, unspecified, unspecified trimester: Secondary | ICD-10-CM

## 2017-06-29 DIAGNOSIS — Z3A26 26 weeks gestation of pregnancy: Secondary | ICD-10-CM | POA: Diagnosis not present

## 2017-06-29 DIAGNOSIS — M358 Other specified systemic involvement of connective tissue: Secondary | ICD-10-CM

## 2017-06-29 DIAGNOSIS — R0602 Shortness of breath: Secondary | ICD-10-CM | POA: Diagnosis present

## 2017-06-29 MED ORDER — IPRATROPIUM-ALBUTEROL 0.5-2.5 (3) MG/3ML IN SOLN
3.0000 mL | Freq: Once | RESPIRATORY_TRACT | Status: AC
  Administered 2017-06-29: 3 mL via RESPIRATORY_TRACT
  Filled 2017-06-29: qty 3

## 2017-06-29 MED ORDER — GUAIFENESIN ER 600 MG PO TB12
1200.0000 mg | ORAL_TABLET | Freq: Once | ORAL | Status: AC
  Administered 2017-06-29: 1200 mg via ORAL
  Filled 2017-06-29: qty 2

## 2017-06-29 MED ORDER — GUAIFENESIN ER 600 MG PO TB12: 600.0000 mg | ORAL_TABLET | Freq: Two times a day (BID) | ORAL | 0 refills | Status: DC | PRN

## 2017-06-29 NOTE — MAU Note (Signed)
Pt stated she has been having a cough for a while but started cough up mucus 2 days ago and feeling SOB

## 2017-06-29 NOTE — Progress Notes (Addendum)
G3P0 @ 26.[redacted] wksga. Presents to triage for SOB and coughing mucus for two days. Diarrhea this morning. Denies LOF or bleeding. + FM. EFM applied.   Seen by pulmonologist for lupus and hx of pneumonia.   2040: respiratory notified for breathing treatment.   2140: checked on pt. Pt states received neb tx but still feels same. MD made aware.

## 2017-06-29 NOTE — MAU Note (Signed)
Urine in the lab  

## 2017-06-29 NOTE — MAU Provider Note (Signed)
History     CSN: [id number]  Arrival date and time: 06/29/17 1455  First Provider Initiated Contact with Patient 06/29/17 1812      Chief Complaint  Patient presents with  . Cough  . Shortness of Breath   HPI  Kayla [name] is a 26 y.o. G3P0110 at [redacted]w[redacted]d who presents with cough & SOB. PMH significant for CHTN, SLE, sjogrens, pneumonia, pulmonary hypertension, & IUFD.  States she has chronic non productive cough d/t her lupus but the cough has become productive over the last 2 days. Reports yellow mucoid sputum. Also reports increased SOB that is worse at night, feels like she has trouble taking deep breaths. This has been an ongoing issue during the pregnancy that has worsened this week. Nothing makes symptoms better or worse. Denies fever/chills, sore throat, chest pain, or sick contacts. Positive fetal movement.   OB History    Gravida Para Term Preterm AB Living   3 1 0 1 1 0   SAB TAB Ectopic Multiple Live Births   1 0 0 0 0      Past Medical History:  Diagnosis Date  . Arthritis    "hands and legs" (01/08/2015)  . CAP (community acquired pneumonia) 01/07/2015  . Daily headache    "sometimes" (01/08/2015)  . GERD (gastroesophageal reflux disease)   . Hypertension   . Lung disease   . Lupus   . Pulmonary hypertension (HCC)   . Sjogren's syndrome [facility])     Past Surgical History:  Procedure Laterality Date  . CARDIAC CATHETERIZATION N/A 09/09/2015   Procedure: Right Heart Cath;  Surgeon: Laurey Morale, MD;  Location: [facility] INVASIVE CV LAB;  Service: Cardiovascular;  Laterality: N/A;  . DILATION AND EVACUATION N/A 07/13/2015   Procedure: DILATATION AND EVACUATION;  Surgeon: Levie Heritage, DO;  Location: [facility] ORS;  Service: Gynecology;  Laterality: N/A;  . FINGER SURGERY Right 03/2014   "laceration, nerve/artery injury" 2nd digit  . VIDEO BRONCHOSCOPY Bilateral 01/12/2015   Procedure: VIDEO BRONCHOSCOPY WITH FLUORO;  Surgeon: Leslye Peer, MD;  Location: [facility] ENDOSCOPY;   Service: Cardiopulmonary;  Laterality: Bilateral;    Family History  Problem Relation Age of Onset  . Diabetes Mother   . Diabetes Maternal Aunt   . Diabetes Maternal Grandmother     Social History  Substance Use Topics  . Smoking status: Former Smoker    Packs/day: 0.10    Years: 5.00    Types: Cigarettes    Quit date: 11/20/2014  . Smokeless tobacco: Never Used  . Alcohol use No    Allergies:  Allergies  Allergen Reactions  . Hydrocodone Nausea And Vomiting  . Zithromax [Azithromycin] Itching and Cough    Facility-Administered Medications Prior to Admission  Medication Dose Route Frequency Provider Last Rate Last Dose  . hydroxyprogesterone caproate ([name]) 250 mg/mL injection 250 mg  250 mg Intramuscular Weekly Reva Bores, MD   250 mg at 06/15/17 1300   Prescriptions Prior to Admission  Medication Sig Dispense Refill Last Dose  . albuterol (PROVENTIL HFA;VENTOLIN HFA) 108 (90 Base) MCG/ACT inhaler Inhale 2 puffs into the lungs every 4 (four) hours as needed for wheezing or shortness of breath. 1 Inhaler 5 Taking  . aspirin EC 81 MG tablet Take 1 tablet (81 mg total) by mouth daily. 30 tablet 2 Taking  . cyclobenzaprine (FLEXERIL) 10 MG tablet Take 1 tablet (10 mg total) by mouth 2 (two) times daily as needed for muscle spasms. 20 tablet 0 Taking  .  fluticasone (FLONASE) 50 MCG/ACT nasal spray Place 2 sprays into both nostrils daily. 16 g 2 Taking  . folic acid (FOLVITE) 1 MG tablet Take 1 tablet (1 mg total) by mouth daily. 100 tablet 5 Taking  . hydroxychloroquine (PLAQUENIL) 200 MG tablet Take 200 mg by mouth daily.   10 Taking  . ondansetron (ZOFRAN ODT) 4 MG disintegrating tablet Take 1 tablet (4 mg total) by mouth every 6 (six) hours as needed for nausea. (Patient not taking: Reported on 06/15/2017) 20 tablet 0 Not Taking  . predniSONE (DELTASONE) 5 MG tablet Take 10 mg by mouth daily.   Taking  . Prenatal Vit-Fe Fumarate-FA (PRENATAL COMPLETE) 14-0.4 MG TABS  Take 1 tablet by mouth daily. 30 each 0 Taking  . promethazine (PHENERGAN) 25 MG tablet Take 0.5-1 tablets (12.5-25 mg total) by mouth every 6 (six) hours as needed. 30 tablet 2 Taking    Review of Systems  Constitutional: Negative.   Respiratory: Positive for cough and shortness of breath. Negative for wheezing.   Cardiovascular: Negative for chest pain and palpitations.  Gastrointestinal: Negative for abdominal pain.  Genitourinary: Negative.    Physical Exam   Blood pressure 121/64, pulse (!) 107, temperature 98.6 F (37 C), resp. rate 18, height 5\' 3"  (1.6 m), weight 182 lb (82.6 kg), last menstrual period 12/27/2016, SpO2 97 %. Patient Vitals for the past 24 hrs:  BP Temp Pulse Resp SpO2 Height Weight  06/29/17 1825 - - - - 97 % - -  06/29/17 1820 - - - - 95 % - -  06/29/17 1815 - - - - 98 % - -  06/29/17 1810 - - - - 97 % - -  06/29/17 1516 121/64 98.6 F (37 C) (!) 107 18 - 5\' 3"  (1.6 m) 182 lb (82.6 kg)  06/29/17 1515 - - - - 96 % - -    Physical Exam  Nursing note and vitals reviewed. Constitutional: She is oriented to person, place, and time. She appears well-developed and well-nourished. No distress.  HENT:  Head: Normocephalic and atraumatic.  Eyes: Conjunctivae are normal. Right eye exhibits no discharge. Left eye exhibits no discharge. No scleral icterus.  Neck: Normal range of motion.  Cardiovascular: Normal rate, regular rhythm and normal heart sounds.   No murmur heard. Respiratory: Effort normal. No respiratory distress. She has no wheezes. She has rales.  GI: Soft.  Neurological: She is alert and oriented to person, place, and time.  Skin: Skin is warm and dry. She is not diaphoretic.  Psychiatric: She has a normal mood and affect. Her behavior is normal. Judgment and thought content normal.   Fetal Tracing:  Baseline: 150 Variability: moderate Accelerations: 10x10 Decelerations: none  Toco: none   MAU Course  Procedures No results found for  this or any previous visit (from the past 24 hour(s)).  No results found.   MDM SpO2 95-97% EKG & chest xray ordered Paged cardiology x 2 to review EKG Chest xray not crossing over in EPIC, report faxed to unit, no acute abnormality Duoneb ordered Care [facility] Leftwich-Kirby CNM   Judeth HornLawrence, Erin, NP 06/29/2017 8:28 PM  Assessment and Plan   EKG with nonspecific ST and T wave abnormalities, reviewed with cardiology. Dr Allena Katzhakravartti reviewed and no changes to medical plan recommended based on EKG findings.  Treat pt symptoms and follow up as clinically indicated.  MDM Pt reports chest tightness improved after Duoneb treatment, still some shortness of breath and productive cough Mucinex  1200 mg PO given in MAU  Rx for Mucinex at discharge Consult Dr Debroah Loop with assessment and findings Pt to continue Mucinex, inhaler at home PRN, follow up with pulmonology if symptoms persist, to MAU or ED with emergencies Keep scheduled prenatal appointments  A: 1. Viral upper respiratory tract infection   2. Interstitial lung disease due to connective tissue disease (HCC)   3. [redacted] weeks gestation of pregnancy     P: D/C home  Follow-up Information    [facility].   Specialty:  Obstetrics and Gynecology Why:  As scheduled, return to MAU as needed for emergencies Contact information: [address] [place] [address] [phone]       Pulmonology Follow up.   Why:  As scheduled, follow up on worsening respiratory symptoms         Allergies as of 06/29/2017      Reactions   Hydrocodone Nausea And Vomiting   Zithromax [azithromycin] Itching, Cough      Medication List    TAKE these medications   albuterol 108 (90 Base) MCG/ACT inhaler Commonly known as:  PROVENTIL HFA;VENTOLIN HFA Inhale 2 puffs into the lungs every 4 (four) hours as needed for wheezing or shortness of breath.   aspirin EC 81 MG tablet Take 1 tablet  (81 mg total) by mouth daily.   fluticasone 50 MCG/ACT nasal spray Commonly known as:  FLONASE Place 2 sprays into both nostrils daily.   folic acid 1 MG tablet Commonly known as:  FOLVITE Take 1 tablet (1 mg total) by mouth daily.   guaiFENesin 600 MG 12 hr tablet Commonly known as:  MUCINEX Take 1-2 tablets (600-1,200 mg total) by mouth 2 (two) times daily as needed.   hydroxychloroquine 200 MG tablet Commonly known as:  PLAQUENIL Take 200 mg by mouth daily.   predniSONE 5 MG tablet Commonly known as:  DELTASONE Take 10 mg by mouth daily.   PRENATAL COMPLETE 14-0.4 MG Tabs Take 1 tablet by mouth daily.       Sharen Counter, CNM 2:06 AM

## 2017-06-30 NOTE — Progress Notes (Signed)
Late entry for 06/29/17 Patient presented to clinic for 17-p injection. There was no medication available for her. I advised that I would investigate and call when med is received. Patient voiced understanding.

## 2017-07-03 ENCOUNTER — Inpatient Hospital Stay (HOSPITAL_COMMUNITY)
Admission: AD | Admit: 2017-07-03 | Discharge: 2017-07-03 | Disposition: A | Discharge status: Home / Self Care | Payer: Medicare Other | Source: Ambulatory Visit | Attending: Obstetrics & Gynecology | Admitting: Obstetrics & Gynecology

## 2017-07-03 ENCOUNTER — Encounter (HOSPITAL_COMMUNITY): Payer: Self-pay

## 2017-07-03 DIAGNOSIS — Z7982 Long term (current) use of aspirin: Secondary | ICD-10-CM | POA: Diagnosis not present

## 2017-07-03 DIAGNOSIS — K219 Gastro-esophageal reflux disease without esophagitis: Secondary | ICD-10-CM | POA: Diagnosis not present

## 2017-07-03 DIAGNOSIS — O99113 Other diseases of the blood and blood-forming organs and certain disorders involving the immune mechanism complicating pregnancy, third trimester: Secondary | ICD-10-CM | POA: Insufficient documentation

## 2017-07-03 DIAGNOSIS — O26892 Other specified pregnancy related conditions, second trimester: Secondary | ICD-10-CM | POA: Diagnosis not present

## 2017-07-03 DIAGNOSIS — Z885 Allergy status to narcotic agent status: Secondary | ICD-10-CM | POA: Insufficient documentation

## 2017-07-03 DIAGNOSIS — O10913 Unspecified pre-existing hypertension complicating pregnancy, third trimester: Secondary | ICD-10-CM | POA: Insufficient documentation

## 2017-07-03 DIAGNOSIS — M3500 Sicca syndrome, unspecified: Secondary | ICD-10-CM | POA: Insufficient documentation

## 2017-07-03 DIAGNOSIS — M329 Systemic lupus erythematosus, unspecified: Secondary | ICD-10-CM | POA: Insufficient documentation

## 2017-07-03 DIAGNOSIS — Z3A27 27 weeks gestation of pregnancy: Secondary | ICD-10-CM | POA: Diagnosis not present

## 2017-07-03 DIAGNOSIS — R102 Pelvic and perineal pain: Secondary | ICD-10-CM | POA: Diagnosis present

## 2017-07-03 DIAGNOSIS — Z9889 Other specified postprocedural states: Secondary | ICD-10-CM | POA: Insufficient documentation

## 2017-07-03 DIAGNOSIS — I272 Pulmonary hypertension, unspecified: Secondary | ICD-10-CM | POA: Insufficient documentation

## 2017-07-03 DIAGNOSIS — O26893 Other specified pregnancy related conditions, third trimester: Secondary | ICD-10-CM | POA: Diagnosis not present

## 2017-07-03 DIAGNOSIS — N739 Female pelvic inflammatory disease, unspecified: Secondary | ICD-10-CM | POA: Diagnosis not present

## 2017-07-03 DIAGNOSIS — G43909 Migraine, unspecified, not intractable, without status migrainosus: Secondary | ICD-10-CM | POA: Diagnosis not present

## 2017-07-03 DIAGNOSIS — Z87891 Personal history of nicotine dependence: Secondary | ICD-10-CM | POA: Insufficient documentation

## 2017-07-03 DIAGNOSIS — Z833 Family history of diabetes mellitus: Secondary | ICD-10-CM | POA: Diagnosis not present

## 2017-07-03 DIAGNOSIS — O99355 Diseases of the nervous system complicating the puerperium: Secondary | ICD-10-CM | POA: Diagnosis not present

## 2017-07-03 DIAGNOSIS — O99613 Diseases of the digestive system complicating pregnancy, third trimester: Secondary | ICD-10-CM | POA: Diagnosis not present

## 2017-07-03 DIAGNOSIS — O9989 Other specified diseases and conditions complicating pregnancy, childbirth and the puerperium: Secondary | ICD-10-CM | POA: Diagnosis not present

## 2017-07-03 DIAGNOSIS — N898 Other specified noninflammatory disorders of vagina: Secondary | ICD-10-CM | POA: Diagnosis not present

## 2017-07-03 DIAGNOSIS — M549 Dorsalgia, unspecified: Secondary | ICD-10-CM | POA: Diagnosis present

## 2017-07-03 DIAGNOSIS — Z881 Allergy status to other antibiotic agents status: Secondary | ICD-10-CM | POA: Insufficient documentation

## 2017-07-03 LAB — URINALYSIS, ROUTINE W REFLEX MICROSCOPIC
BILIRUBIN URINE: NEGATIVE
Glucose, UA: NEGATIVE mg/dL
HGB URINE DIPSTICK: NEGATIVE
Ketones, ur: NEGATIVE mg/dL
NITRITE: NEGATIVE
Protein, ur: NEGATIVE mg/dL
SPECIFIC GRAVITY, URINE: 1.01 (ref 1.005–1.030)
pH: 6 (ref 5.0–8.0)

## 2017-07-03 LAB — WET PREP, GENITAL
CLUE CELLS WET PREP: NONE SEEN
SPERM: NONE SEEN
TRICH WET PREP: NONE SEEN
YEAST WET PREP: NONE SEEN

## 2017-07-03 MED ORDER — SODIUM CHLORIDE 0.9 % IV SOLN
25.0000 mg | Freq: Once | INTRAVENOUS | Status: DC
  Filled 2017-07-03: qty 1

## 2017-07-03 MED ORDER — CEFTRIAXONE SODIUM 250 MG IJ SOLR
250.0000 mg | Freq: Once | INTRAMUSCULAR | Status: AC
  Administered 2017-07-03: 250 mg via INTRAMUSCULAR
  Filled 2017-07-03: qty 250

## 2017-07-03 MED ORDER — SODIUM CHLORIDE 0.9 % IV SOLN
25.0000 mg | Freq: Once | INTRAVENOUS | Status: AC
  Administered 2017-07-03: 25 mg via INTRAVENOUS
  Filled 2017-07-03: qty 1

## 2017-07-03 MED ORDER — PROMETHAZINE HCL 25 MG/ML IJ SOLN: 25.0000 mg | Freq: Once | INTRAMUSCULAR | Status: DC

## 2017-07-03 MED ORDER — LACTATED RINGERS IV BOLUS (SEPSIS): 1000.0000 mL | Freq: Once | INTRAVENOUS | Status: DC

## 2017-07-03 MED ORDER — DIPHENHYDRAMINE HCL 50 MG/ML IJ SOLN
25.0000 mg | Freq: Once | INTRAMUSCULAR | Status: AC
  Administered 2017-07-03: 25 mg via INTRAVENOUS
  Filled 2017-07-03: qty 1

## 2017-07-03 MED ORDER — DEXAMETHASONE SODIUM PHOSPHATE 10 MG/ML IJ SOLN
10.0000 mg | Freq: Once | INTRAMUSCULAR | Status: AC
  Administered 2017-07-03: 10 mg via INTRAVENOUS
  Filled 2017-07-03: qty 1

## 2017-07-03 NOTE — MAU Note (Signed)
Pt continually moves in the bed and is hard to monitor.

## 2017-07-03 NOTE — MAU Note (Signed)
Pt having intermittent back pain, 6/10. Migraine earlier today 9/10. Urine has a strong odor and pressure in her pelvis.

## 2017-07-03 NOTE — MAU Provider Note (Signed)
History   .  CSN: [id number]  Arrival date and time: 07/03/17 4098   First Provider Initiated Contact with Patient 07/03/17 1841      Chief Complaint  Patient presents with  . Pelvic Pain  . Back Pain  Kayla [name] is a 26 y.o. Female with a PMH of Sjogrens, lupus, recurrent UTIs coming in for pelvic pain, back pain, vaginal discharge and migraine. She was recently treated for Gonorrhea on 8/2 with 1x dose ceftriaxone , she  Also reported started on daily microbid approximately 3 weeks ago. She was given 1 diflucan 1 week ago for a yeast infection. The pelvic pain started about 4 days ago, describes as pelvic pressure. She started having right sided back pain last night. The back pain radiates around and into her pelvis. The headache started today, on the right side migrating to the left, some photophobia, and nausea, no vomiting.      Pelvic Pain  The patient's primary symptoms include pelvic pain and vaginal discharge. Associated symptoms include back pain, dysuria and nausea. Pertinent negatives include no chills, constipation, fever, hematuria or vomiting.  Back Pain  Associated symptoms include dysuria and pelvic pain. Pertinent negatives include no chest pain or fever.    Pertinent Gynecological History:   Sexually transmitted diseases: recent diagnosis: 8/2 Gonorrhea   Past Medical History:  Diagnosis Date  . Arthritis    "hands and legs" (01/08/2015)  . CAP (community acquired pneumonia) 01/07/2015  . Daily headache    "sometimes" (01/08/2015)  . GERD (gastroesophageal reflux disease)   . Hypertension   . Lung disease   . Lupus   . Pulmonary hypertension (HCC)   . Sjogren's syndrome [facility])     Past Surgical History:  Procedure Laterality Date  . CARDIAC CATHETERIZATION N/A 09/09/2015   Procedure: Right Heart Cath;  Surgeon: Laurey Morale, MD;  Location: [facility] INVASIVE CV LAB;  Service: Cardiovascular;  Laterality: N/A;  . DILATION AND EVACUATION N/A 07/13/2015   Procedure: DILATATION AND EVACUATION;  Surgeon: Levie Heritage, DO;  Location: [facility] ORS;  Service: Gynecology;  Laterality: N/A;  . FINGER SURGERY Right 03/2014   "laceration, nerve/artery injury" 2nd digit  . VIDEO BRONCHOSCOPY Bilateral 01/12/2015   Procedure: VIDEO BRONCHOSCOPY WITH FLUORO;  Surgeon: Leslye Peer, MD;  Location: [facility] ENDOSCOPY;  Service: Cardiopulmonary;  Laterality: Bilateral;    Family History  Problem Relation Age of Onset  . Diabetes Mother   . Diabetes Maternal Aunt   . Diabetes Maternal Grandmother     Social History  Substance Use Topics  . Smoking status: Former Smoker    Packs/day: 0.10    Years: 5.00    Types: Cigarettes    Quit date: 11/20/2014  . Smokeless tobacco: Never Used  . Alcohol use No    Allergies:  Allergies  Allergen Reactions  . Hydrocodone Nausea And Vomiting  . Zithromax [Azithromycin] Itching and Cough    Facility-Administered Medications Prior to Admission  Medication Dose Route Frequency Provider Last Rate Last Dose  . hydroxyprogesterone caproate ([name]) 250 mg/mL injection 250 mg  250 mg Intramuscular Weekly Reva Bores, MD   250 mg at 06/15/17 1300   Prescriptions Prior to Admission  Medication Sig Dispense Refill Last Dose  . albuterol (PROVENTIL HFA;VENTOLIN HFA) 108 (90 Base) MCG/ACT inhaler Inhale 2 puffs into the lungs every 4 (four) hours as needed for wheezing or shortness of breath. 1 Inhaler 5 06/28/2017 at Unknown time  . aspirin EC 81 MG tablet Take  1 tablet (81 mg total) by mouth daily. 30 tablet 2 06/28/2017 at Unknown time  . fluticasone (FLONASE) 50 MCG/ACT nasal spray Place 2 sprays into both nostrils daily. 16 g 2 Past Week at Unknown time  . folic acid (FOLVITE) 1 MG tablet Take 1 tablet (1 mg total) by mouth daily. 100 tablet 5 06/29/2017 at Unknown time  . guaiFENesin (MUCINEX) 600 MG 12 hr tablet Take 1-2 tablets (600-1,200 mg total) by mouth 2 (two) times daily as needed. 12 tablet 0   . hydroxychloroquine  (PLAQUENIL) 200 MG tablet Take 200 mg by mouth daily.   10 06/29/2017 at Unknown time  . predniSONE (DELTASONE) 5 MG tablet Take 10 mg by mouth daily.   06/29/2017 at Unknown time  . Prenatal Vit-Fe Fumarate-FA (PRENATAL COMPLETE) 14-0.4 MG TABS Take 1 tablet by mouth daily. 30 each 0 06/29/2017 at Unknown time    Review of Systems  Constitutional: Negative for chills, diaphoresis and fever.  Respiratory: Negative for cough and shortness of breath.   Cardiovascular: Negative for chest pain.  Gastrointestinal: Positive for nausea. Negative for constipation and vomiting.  Genitourinary: Positive for dysuria, genital sores, pelvic pain and vaginal discharge. Negative for hematuria.  Musculoskeletal: Positive for back pain.   Physical Exam   Blood pressure 118/70, pulse (!) 107, last menstrual period 12/27/2016.  Physical Exam  Constitutional: She appears well-developed.  Cardiovascular: Normal rate and regular rhythm.   Respiratory: No respiratory distress.  GI:  Suprapubic tender to palpation  Musculoskeletal:  Right CVA tenderness  Skin: No rash noted. No erythema.   Urine dipstick shows negative for all components, trace bacteria.    MAU Course  Procedures  MDM Pelvic exam significant for white discharge.  UA- done negative for all components save trace bacteria and LE Wet prep- negative -tric,yeast, clue cells, + too numerous to count WBCs Given Ceftriaxone IM  Assessment and Plan  Kayla [name] is a 26 y.o. female coming in for pelvic pain, discharge, and migraine.   1) Pelvic pain/Discharge - UA negative for signs of UTI.; wet prep shows many WBCs,  - given pelvic pain, discharge, negative UA/ Wet prep for obvious pathogens will treat emperically for gonorrhea with Ceftriaxone; would desire to give Azithromycin but patient is allergic. Alternative given is doxycycline but patient is pregnant.   2) Migraine - treated with decadron, phenergan, and benadryl.   Will message  Infectious disease for further management options.   F/u with prenatal visit on 8/17   Ignacia MarvelKendrick C White 07/03/2017, 7:01 PM   OB FELLOW MAU DISCHARGE ATTESTATION  I have seen and examined this patient; I agree with above documentation in the resident's note.   Patient with pelvic pain, vaginal discharge and TNTC WBC on wet prep. Treated for gonorrhea 2 weeks ago, but only with Rocephin, did not receive dual therapy d/t azithromycin allergy. Given finding above, will empirically re-treat with Rocephin and Dr. Ninetta LightsHatcher with ID messaged for recommendations for additional agent given than she is pregnant and doxycycline is contraindicated.   Urine sent for culture given history, but UA and symptoms not suggestive of UTI.   Frederik PearJulie P Degele, MD OB Fellow 9:00 PM

## 2017-07-04 ENCOUNTER — Encounter (HOSPITAL_COMMUNITY): Payer: Self-pay | Admitting: *Deleted

## 2017-07-04 ENCOUNTER — Inpatient Hospital Stay (HOSPITAL_COMMUNITY)
Admission: AD | Admit: 2017-07-04 | Discharge: 2017-07-04 | Disposition: A | Discharge status: Home / Self Care | Payer: Medicare Other | Source: Ambulatory Visit | Attending: Family Medicine | Admitting: Family Medicine

## 2017-07-04 DIAGNOSIS — Z3A27 27 weeks gestation of pregnancy: Secondary | ICD-10-CM | POA: Insufficient documentation

## 2017-07-04 DIAGNOSIS — Z881 Allergy status to other antibiotic agents status: Secondary | ICD-10-CM | POA: Diagnosis not present

## 2017-07-04 DIAGNOSIS — I272 Pulmonary hypertension, unspecified: Secondary | ICD-10-CM | POA: Insufficient documentation

## 2017-07-04 DIAGNOSIS — M329 Systemic lupus erythematosus, unspecified: Secondary | ICD-10-CM | POA: Insufficient documentation

## 2017-07-04 DIAGNOSIS — O9989 Other specified diseases and conditions complicating pregnancy, childbirth and the puerperium: Secondary | ICD-10-CM | POA: Diagnosis not present

## 2017-07-04 DIAGNOSIS — Z7982 Long term (current) use of aspirin: Secondary | ICD-10-CM | POA: Diagnosis not present

## 2017-07-04 DIAGNOSIS — Z9889 Other specified postprocedural states: Secondary | ICD-10-CM | POA: Diagnosis not present

## 2017-07-04 DIAGNOSIS — Z833 Family history of diabetes mellitus: Secondary | ICD-10-CM | POA: Diagnosis not present

## 2017-07-04 DIAGNOSIS — Z87891 Personal history of nicotine dependence: Secondary | ICD-10-CM | POA: Diagnosis not present

## 2017-07-04 DIAGNOSIS — O26892 Other specified pregnancy related conditions, second trimester: Secondary | ICD-10-CM | POA: Diagnosis not present

## 2017-07-04 DIAGNOSIS — Z885 Allergy status to narcotic agent status: Secondary | ICD-10-CM | POA: Insufficient documentation

## 2017-07-04 DIAGNOSIS — N898 Other specified noninflammatory disorders of vagina: Secondary | ICD-10-CM | POA: Diagnosis present

## 2017-07-04 DIAGNOSIS — M3500 Sicca syndrome, unspecified: Secondary | ICD-10-CM | POA: Diagnosis not present

## 2017-07-04 DIAGNOSIS — O26893 Other specified pregnancy related conditions, third trimester: Secondary | ICD-10-CM | POA: Insufficient documentation

## 2017-07-04 DIAGNOSIS — O99413 Diseases of the circulatory system complicating pregnancy, third trimester: Secondary | ICD-10-CM | POA: Diagnosis not present

## 2017-07-04 LAB — WET PREP, GENITAL
CLUE CELLS WET PREP: NONE SEEN
SPERM: NONE SEEN
TRICH WET PREP: NONE SEEN
YEAST WET PREP: NONE SEEN

## 2017-07-04 LAB — URINALYSIS, ROUTINE W REFLEX MICROSCOPIC
BILIRUBIN URINE: NEGATIVE
Glucose, UA: NEGATIVE mg/dL
Hgb urine dipstick: NEGATIVE
KETONES UR: 20 mg/dL — AB
Nitrite: NEGATIVE
PH: 6 (ref 5.0–8.0)
Protein, ur: NEGATIVE mg/dL
Specific Gravity, Urine: 1.011 (ref 1.005–1.030)

## 2017-07-04 LAB — POCT FERN TEST: POCT FERN TEST: NEGATIVE

## 2017-07-04 LAB — AMNISURE RUPTURE OF MEMBRANE (ROM) NOT AT ARMC: Amnisure ROM: NEGATIVE

## 2017-07-04 NOTE — MAU Note (Signed)
Pt came in by EMS, pt states she was in the store, had a contraction & felt a gush of fluid, was wet through her pants.  Has had occasional uc's today, denies bleeding.

## 2017-07-04 NOTE — Discharge Instructions (Signed)
Preterm Labor and Birth Information  The normal length of a pregnancy is 39-41 weeks. Preterm labor is when labor starts before 37 completed weeks of pregnancy.  What are the risk factors for preterm labor?  Preterm labor is more likely to occur in women who:   Have certain infections during pregnancy such as a bladder infection, sexually transmitted infection, or infection inside the uterus (chorioamnionitis).   Have a shorter-than-normal cervix.   Have gone into preterm labor before.   Have had surgery on their cervix.   Are younger than age 17 or older than age 35.   Are African American.   Are pregnant with twins or multiple babies (multiple gestation).   Take street drugs or smoke while pregnant.   Do not gain enough weight while pregnant.   Became pregnant shortly after having been pregnant.    What are the symptoms of preterm labor?  Symptoms of preterm labor include:   Cramps similar to those that can happen during a menstrual period. The cramps may happen with diarrhea.   Pain in the abdomen or lower back.   Regular uterine contractions that may feel like tightening of the abdomen.   A feeling of increased pressure in the pelvis.   Increased watery or bloody mucus discharge from the vagina.   Water breaking (ruptured amniotic sac).    Why is it important to recognize signs of preterm labor?  It is important to recognize signs of preterm labor because babies who are born prematurely may not be fully developed. This can put them at an increased risk for:   Long-term (chronic) heart and lung problems.   Difficulty immediately after birth with regulating body systems, including blood sugar, body temperature, heart rate, and breathing rate.   Bleeding in the brain.   Cerebral palsy.   Learning difficulties.   Death.    These risks are highest for babies who are born before 34 weeks of pregnancy.  How is preterm labor treated?  Treatment depends on the length of your pregnancy, your  condition, and the health of your baby. It may involve:   Having a stitch (suture) placed in your cervix to prevent your cervix from opening too early (cerclage).   Taking or being given medicines, such as:  ? Hormone medicines. These may be given early in pregnancy to help support the pregnancy.  ? Medicine to stop contractions.  ? Medicines to help mature the baby's lungs. These may be prescribed if the risk of delivery is high.  ? Medicines to prevent your baby from developing cerebral palsy.    If the labor happens before 34 weeks of pregnancy, you may need to stay in the hospital.  What should I do if I think I am in preterm labor?  If you think that you are going into preterm labor, call your health care provider right away.  How can I prevent preterm labor in future pregnancies?  To increase your chance of having a full-term pregnancy:   Do not use any tobacco products, such as cigarettes, chewing tobacco, and e-cigarettes. If you need help quitting, ask your health care provider.   Do not use street drugs or medicines that have not been prescribed to you during your pregnancy.   Talk with your health care provider before taking any herbal supplements, even if you have been taking them regularly.   Make sure you gain a healthy amount of weight during your pregnancy.   Watch for infection. If you   think that you might have an infection, get it checked right away.   Make sure to tell your health care provider if you have gone into preterm labor before.    This information is not intended to replace advice given to you by your health care provider. Make sure you discuss any questions you have with your health care provider.  Document Released: 01/28/2004 Document Revised: 04/19/2016 Document Reviewed: 03/30/2016  Elsevier Interactive Patient Education  2018 [employer]

## 2017-07-04 NOTE — MAU Provider Note (Signed)
History     CSN: [id number]  Arrival date and time: 07/04/17 1339  First Provider Initiated Contact with Patient 07/04/17 1402      Chief Complaint  Patient presents with  . Rupture of Membranes   HPI Kayla [name] is a 26 y.o. G3P0110 at 6642w0d who presents via EMS for LOF. States she was at the store this afternoon when she felt a contraction & had a gush of fluid that soaked her underwear. Describes as clear fluid with white discharge that did not have odor. Also reports come lower abdominal cramping. Rates pain 0/10 currently. Denies n/v/d, dysuria, vaginal bleeding, or recent intercourse. Positive fetal movement.   OB History    Gravida Para Term Preterm AB Living   3 1 0 1 1 0   SAB TAB Ectopic Multiple Live Births   1 0 0 0 0      Past Medical History:  Diagnosis Date  . Arthritis    "hands and legs" (01/08/2015)  . CAP (community acquired pneumonia) 01/07/2015  . Daily headache    "sometimes" (01/08/2015)  . GERD (gastroesophageal reflux disease)   . Hypertension   . Lung disease   . Lupus   . Pulmonary hypertension (HCC)   . Sjogren's syndrome [facility](HCC)     Past Surgical History:  Procedure Laterality Date  . CARDIAC CATHETERIZATION N/A 09/09/2015   Procedure: Right Heart Cath;  Surgeon: Laurey Moralealton S McLean, MD;  [facility];  Service: Cardiovascular;  Laterality: N/A;  . DILATION AND EVACUATION N/A 07/13/2015   Procedure: DILATATION AND EVACUATION;  Surgeon: Levie HeritageJacob J Stinson, DO;  Location: [facility] ORS;  Service: Gynecology;  Laterality: N/A;  . FINGER SURGERY Right 03/2014   "laceration, nerve/artery injury" 2nd digit  . VIDEO BRONCHOSCOPY Bilateral 01/12/2015   Procedure: VIDEO BRONCHOSCOPY WITH FLUORO;  Surgeon: Leslye Peerobert S Byrum, MD;  [facility];  Service: Cardiopulmonary;  Laterality: Bilateral;    Family History  Problem Relation Age of Onset  . Diabetes Mother   . Diabetes Maternal Aunt   . Diabetes Maternal Grandmother     Social  History  Substance Use Topics  . Smoking status: Former Smoker    Packs/day: 0.10    Years: 5.00    Types: Cigarettes    Quit date: 11/20/2014  . Smokeless tobacco: Never Used  . Alcohol use No    Allergies:  Allergies  Allergen Reactions  . Hydrocodone Nausea And Vomiting  . Zithromax [Azithromycin] Itching and Cough    Facility-Administered Medications Prior to Admission  Medication Dose Route Frequency Provider Last Rate Last Dose  . hydroxyprogesterone caproate ([name]) 250 mg/mL injection 250 mg  250 mg Intramuscular Weekly Reva BoresPratt, Tanya S, MD   250 mg at 06/15/17 1300   Prescriptions Prior to Admission  Medication Sig Dispense Refill Last Dose  . albuterol (PROVENTIL HFA;VENTOLIN HFA) 108 (90 Base) MCG/ACT inhaler Inhale 2 puffs into the lungs every 4 (four) hours as needed for wheezing or shortness of breath. 1 Inhaler 5 06/28/2017 at Unknown time  . aspirin EC 81 MG tablet Take 1 tablet (81 mg total) by mouth daily. 30 tablet 2 06/28/2017 at Unknown time  . fluticasone (FLONASE) 50 MCG/ACT nasal spray Place 2 sprays into both nostrils daily. 16 g 2 Past Week at Unknown time  . folic acid (FOLVITE) 1 MG tablet Take 1 tablet (1 mg total) by mouth daily. 100 tablet 5 06/29/2017 at Unknown time  . guaiFENesin (MUCINEX) 600 MG 12 hr tablet Take  1-2 tablets (600-1,200 mg total) by mouth 2 (two) times daily as needed. 12 tablet 0   . hydroxychloroquine (PLAQUENIL) 200 MG tablet Take 200 mg by mouth daily.   10 06/29/2017 at Unknown time  . predniSONE (DELTASONE) 5 MG tablet Take 10 mg by mouth daily.   06/29/2017 at Unknown time  . Prenatal Vit-Fe Fumarate-FA (PRENATAL COMPLETE) 14-0.4 MG TABS Take 1 tablet by mouth daily. 30 each 0 06/29/2017 at Unknown time    Review of Systems  Constitutional: Negative.   Gastrointestinal: Negative.   Genitourinary: Positive for vaginal discharge. Negative for dysuria and vaginal bleeding.   Physical Exam   Blood pressure 119/77, pulse 97, temperature  98.3 F (36.8 C), temperature source Oral, resp. rate 18, last menstrual period 12/27/2016.  Physical Exam  Nursing note and vitals reviewed. Constitutional: She is oriented to person, place, and time. She appears well-developed and well-nourished. No distress.  HENT:  Head: Normocephalic and atraumatic.  Eyes: Conjunctivae are normal. Right eye exhibits no discharge. Left eye exhibits no discharge. No scleral icterus.  Neck: Normal range of motion.  Respiratory: Effort normal. No respiratory distress.  Genitourinary: No bleeding in the vagina. Vaginal discharge (moderate amount of thin white discharge; no pooling) found.  Neurological: She is alert and oriented to person, place, and time.  Skin: Skin is warm and dry. She is not diaphoretic.  Psychiatric: She has a normal mood and affect. Her behavior is normal. Judgment and thought content normal.   Dilation: Closed Effacement (%): Thick Cervical Position: Posterior Exam by:: Judeth Horn NP  Fetal Tracing:  Baseline: 150 Variability: moderate Accelerations: 10x10 Decelerations: none  Toco: none MAU Course  Procedures Results for orders placed or performed during the hospital encounter of 07/04/17 (from the past 24 hour(s))  Wet prep, genital     Status: Abnormal   Collection Time: 07/04/17  2:15 PM  Result Value Ref Range   Yeast Wet Prep HPF POC NONE SEEN NONE SEEN   Trich, Wet Prep NONE SEEN NONE SEEN   Clue Cells Wet Prep HPF POC NONE SEEN NONE SEEN   WBC, Wet Prep HPF POC MANY (A) NONE SEEN   Sperm NONE SEEN   Urinalysis, Routine w reflex microscopic     Status: Abnormal   Collection Time: 07/04/17  2:15 PM  Result Value Ref Range   Color, Urine YELLOW YELLOW   APPearance CLEAR CLEAR   Specific Gravity, Urine 1.011 1.005 - 1.030   pH 6.0 5.0 - 8.0   Glucose, UA NEGATIVE NEGATIVE mg/dL   Hgb urine dipstick NEGATIVE NEGATIVE   Bilirubin Urine NEGATIVE NEGATIVE   Ketones, ur 20 (A) NEGATIVE mg/dL   Protein, ur  NEGATIVE NEGATIVE mg/dL   Nitrite NEGATIVE NEGATIVE   Leukocytes, UA SMALL (A) NEGATIVE   RBC / HPF 0-5 0 - 5 RBC/hpf   WBC, UA 0-5 0 - 5 WBC/hpf   Bacteria, UA RARE (A) NONE SEEN   Squamous Epithelial / LPF 0-5 (A) NONE SEEN   Mucous PRESENT   Amnisure rupture of membrane (rom)not at [facility]     Status: None   Collection Time: 07/04/17  2:25 PM  Result Value Ref Range   Amnisure ROM NEGATIVE   POCT fern test     Status: None   Collection Time: 07/04/17  2:28 PM  Result Value Ref Range   POCT Fern Test Negative = intact amniotic membranes     MDM No pooling. Fern negative. Amnisure collected. Cervix closed/thick. Wet prep  collected.  Amnisure negative Assessment and Plan  A: 1. Vaginal discharge during pregnancy in second trimester    P; Discharge home Keep f/u with OB Discussed reasons to return to MAU  Judeth Horn 07/04/2017, 2:01 PM

## 2017-07-05 LAB — URINE CULTURE

## 2017-07-05 LAB — GC/CHLAMYDIA PROBE AMP (~~LOC~~) NOT AT ARMC
Chlamydia: NEGATIVE
Neisseria Gonorrhea: NEGATIVE

## 2017-07-07 ENCOUNTER — Encounter: Payer: Self-pay | Admitting: Obstetrics & Gynecology

## 2017-07-07 ENCOUNTER — Telehealth: Payer: Self-pay

## 2017-07-07 NOTE — Telephone Encounter (Signed)
Received called from [facility] pharmacy. They are out of the single dose vials. I have given a verbal order for mulit dose so that patient can continued receiving her medications.

## 2017-07-13 ENCOUNTER — Ambulatory Visit (HOSPITAL_COMMUNITY)
Admission: RE | Admit: 2017-07-13 | Discharge: 2017-07-13 | Disposition: A | Discharge status: Home / Self Care | Payer: Medicare Other | Source: Ambulatory Visit | Attending: Obstetrics & Gynecology | Admitting: Obstetrics & Gynecology

## 2017-07-13 ENCOUNTER — Encounter (HOSPITAL_COMMUNITY): Payer: Self-pay

## 2017-07-13 ENCOUNTER — Other Ambulatory Visit (HOSPITAL_COMMUNITY): Payer: Self-pay | Admitting: Obstetrics and Gynecology

## 2017-07-13 DIAGNOSIS — O9989 Other specified diseases and conditions complicating pregnancy, childbirth and the puerperium: Secondary | ICD-10-CM | POA: Diagnosis not present

## 2017-07-13 DIAGNOSIS — O10919 Unspecified pre-existing hypertension complicating pregnancy, unspecified trimester: Secondary | ICD-10-CM

## 2017-07-13 DIAGNOSIS — M3500 Sicca syndrome, unspecified: Secondary | ICD-10-CM | POA: Diagnosis not present

## 2017-07-13 DIAGNOSIS — O09293 Supervision of pregnancy with other poor reproductive or obstetric history, third trimester: Secondary | ICD-10-CM | POA: Diagnosis not present

## 2017-07-13 DIAGNOSIS — O10013 Pre-existing essential hypertension complicating pregnancy, third trimester: Secondary | ICD-10-CM | POA: Insufficient documentation

## 2017-07-13 DIAGNOSIS — O283 Abnormal ultrasonic finding on antenatal screening of mother: Secondary | ICD-10-CM

## 2017-07-13 DIAGNOSIS — M329 Systemic lupus erythematosus, unspecified: Secondary | ICD-10-CM | POA: Diagnosis not present

## 2017-07-13 DIAGNOSIS — O358XX0 Maternal care for other (suspected) fetal abnormality and damage, not applicable or unspecified: Secondary | ICD-10-CM | POA: Insufficient documentation

## 2017-07-13 DIAGNOSIS — L930 Discoid lupus erythematosus: Secondary | ICD-10-CM

## 2017-07-13 DIAGNOSIS — Z3A28 28 weeks gestation of pregnancy: Secondary | ICD-10-CM | POA: Insufficient documentation

## 2017-07-15 ENCOUNTER — Encounter (HOSPITAL_COMMUNITY): Payer: Self-pay

## 2017-07-15 ENCOUNTER — Inpatient Hospital Stay (HOSPITAL_COMMUNITY)
Admission: AD | Admit: 2017-07-15 | Discharge: 2017-07-15 | Disposition: A | Discharge status: Home / Self Care | Payer: Medicare Other | Source: Ambulatory Visit | Attending: Obstetrics and Gynecology | Admitting: Obstetrics and Gynecology

## 2017-07-15 DIAGNOSIS — Z885 Allergy status to narcotic agent status: Secondary | ICD-10-CM | POA: Insufficient documentation

## 2017-07-15 DIAGNOSIS — Z833 Family history of diabetes mellitus: Secondary | ICD-10-CM | POA: Diagnosis not present

## 2017-07-15 DIAGNOSIS — Z888 Allergy status to other drugs, medicaments and biological substances status: Secondary | ICD-10-CM | POA: Diagnosis not present

## 2017-07-15 DIAGNOSIS — Z87891 Personal history of nicotine dependence: Secondary | ICD-10-CM | POA: Insufficient documentation

## 2017-07-15 DIAGNOSIS — O9989 Other specified diseases and conditions complicating pregnancy, childbirth and the puerperium: Secondary | ICD-10-CM | POA: Diagnosis not present

## 2017-07-15 DIAGNOSIS — Z3A28 28 weeks gestation of pregnancy: Secondary | ICD-10-CM | POA: Insufficient documentation

## 2017-07-15 DIAGNOSIS — M3500 Sicca syndrome, unspecified: Secondary | ICD-10-CM | POA: Diagnosis not present

## 2017-07-15 DIAGNOSIS — O26893 Other specified pregnancy related conditions, third trimester: Secondary | ICD-10-CM

## 2017-07-15 DIAGNOSIS — I272 Pulmonary hypertension, unspecified: Secondary | ICD-10-CM | POA: Insufficient documentation

## 2017-07-15 DIAGNOSIS — K219 Gastro-esophageal reflux disease without esophagitis: Secondary | ICD-10-CM | POA: Insufficient documentation

## 2017-07-15 DIAGNOSIS — O163 Unspecified maternal hypertension, third trimester: Secondary | ICD-10-CM | POA: Diagnosis not present

## 2017-07-15 DIAGNOSIS — N898 Other specified noninflammatory disorders of vagina: Secondary | ICD-10-CM | POA: Diagnosis not present

## 2017-07-15 DIAGNOSIS — O99613 Diseases of the digestive system complicating pregnancy, third trimester: Secondary | ICD-10-CM | POA: Diagnosis not present

## 2017-07-15 DIAGNOSIS — Z9889 Other specified postprocedural states: Secondary | ICD-10-CM | POA: Insufficient documentation

## 2017-07-15 DIAGNOSIS — M329 Systemic lupus erythematosus, unspecified: Secondary | ICD-10-CM | POA: Diagnosis not present

## 2017-07-15 LAB — WET PREP, GENITAL
CLUE CELLS WET PREP: NONE SEEN
Sperm: NONE SEEN
Trich, Wet Prep: NONE SEEN
Yeast Wet Prep HPF POC: NONE SEEN

## 2017-07-15 LAB — URINALYSIS, ROUTINE W REFLEX MICROSCOPIC
Bilirubin Urine: NEGATIVE
GLUCOSE, UA: NEGATIVE mg/dL
Hgb urine dipstick: NEGATIVE
KETONES UR: NEGATIVE mg/dL
Nitrite: NEGATIVE
PROTEIN: NEGATIVE mg/dL
Specific Gravity, Urine: 1.016 (ref 1.005–1.030)
pH: 6 (ref 5.0–8.0)

## 2017-07-15 NOTE — Discharge Instructions (Signed)

## 2017-07-15 NOTE — MAU Provider Note (Signed)
History   G3P0110 @ 28.4 wks in with c/o vag discharge and vag itching x 3 days. States has been on antibiotic for over a week. Denies vag bleeding or ROM. States good fetal movement. FHR baseline 150's mod variability,   CSN: [id number]  Arrival date & time 07/15/17  1130   None     Chief Complaint  Patient presents with  . Vaginal Discharge  . Vaginal Itching    HPI  Past Medical History:  Diagnosis Date  . Arthritis    "hands and legs" (01/08/2015)  . CAP (community acquired pneumonia) 01/07/2015  . Daily headache    "sometimes" (01/08/2015)  . GERD (gastroesophageal reflux disease)   . Hypertension   . Lung disease   . Lupus   . Pulmonary hypertension (HCC)   . Sjogren's syndrome [facility])     Past Surgical History:  Procedure Laterality Date  . CARDIAC CATHETERIZATION N/A 09/09/2015   Procedure: Right Heart Cath;  Surgeon: Laurey Morale, MD;  Location: [facility] INVASIVE CV LAB;  Service: Cardiovascular;  Laterality: N/A;  . DILATION AND EVACUATION N/A 07/13/2015   Procedure: DILATATION AND EVACUATION;  Surgeon: Levie Heritage, DO;  Location: [facility] ORS;  Service: Gynecology;  Laterality: N/A;  . FINGER SURGERY Right 03/2014   "laceration, nerve/artery injury" 2nd digit  . VIDEO BRONCHOSCOPY Bilateral 01/12/2015   Procedure: VIDEO BRONCHOSCOPY WITH FLUORO;  Surgeon: Leslye Peer, MD;  Location: [facility] ENDOSCOPY;  Service: Cardiopulmonary;  Laterality: Bilateral;    Family History  Problem Relation Age of Onset  . Diabetes Mother   . Diabetes Maternal Aunt   . Diabetes Maternal Grandmother     Social History  Substance Use Topics  . Smoking status: Former Smoker    Packs/day: 0.10    Years: 5.00    Types: Cigarettes    Quit date: 11/20/2014  . Smokeless tobacco: Never Used  . Alcohol use No    OB History    Gravida Para Term Preterm AB Living   3 1 0 1 1 0   SAB TAB Ectopic Multiple Live Births   1 0 0 0 0      Review of Systems  Constitutional: Negative.    HENT: Negative.   Eyes: Negative.   Respiratory: Negative.   Cardiovascular: Negative.   Gastrointestinal: Negative.   Endocrine: Negative.   Genitourinary: Positive for vaginal discharge.  Musculoskeletal: Negative.   Skin: Negative.   Allergic/Immunologic: Negative.   Neurological: Negative.   Hematological: Negative.   Psychiatric/Behavioral: Negative.     Allergies  Hydrocodone and Zithromax [azithromycin]  Home Medications    LMP 12/27/2016   Physical Exam  Constitutional: She is oriented to person, place, and time. She appears well-developed and well-nourished.  HENT:  Head: Normocephalic.  Neck: Normal range of motion.  Cardiovascular: Normal rate, regular rhythm, normal heart sounds and intact distal pulses.   Pulmonary/Chest: Effort normal and breath sounds normal.  Abdominal: Soft. Bowel sounds are normal.  Genitourinary: Vagina normal and uterus normal.  Musculoskeletal: Normal range of motion.  Neurological: She is alert and oriented to person, place, and time. She has normal reflexes.  Skin: Skin is warm and dry.  Psychiatric: She has a normal mood and affect. Her behavior is normal. Judgment and thought content normal.    MAU Course  Procedures (including critical care time)  Labs Reviewed  WET PREP, GENITAL  URINALYSIS, ROUTINE W REFLEX MICROSCOPIC  GC/CHLAMYDIA PROBE AMP ([place]) NOT AT [facility]   No results  found.   1. Vaginal discharge during pregnancy in third trimester       MDM  Wet prep  neg. Chla and GC done. VSS. FHR 140's with 10x10 accels no decels. Reactive FHR pattern. Will d/c home in stable condition.

## 2017-07-15 NOTE — Progress Notes (Addendum)
G3P0 @ 28.[redacted] wksga. Presents to triage for vaginal itching. States been on abx for past 10 days and just completed last dose yesterday. Denies LOF or bleeding. + FM. EFM applied.   Provider at bs assessing pt. wetprep and GC done.   1227: Picking up maternal HR. Monitor adjusted.   1249: picking up maternal HR. monitor adjusted.  1255: picking up materna hr. monitor adjusted.

## 2017-07-17 LAB — GC/CHLAMYDIA PROBE AMP (~~LOC~~) NOT AT ARMC
Chlamydia: NEGATIVE
Neisseria Gonorrhea: NEGATIVE

## 2017-07-20 ENCOUNTER — Encounter: Payer: Self-pay | Admitting: Obstetrics & Gynecology

## 2017-07-20 ENCOUNTER — Ambulatory Visit (INDEPENDENT_AMBULATORY_CARE_PROVIDER_SITE_OTHER): Payer: Medicare Other | Admitting: Obstetrics & Gynecology

## 2017-07-20 VITALS — BP 120/79 | HR 91 | Wt 181.9 lb

## 2017-07-20 DIAGNOSIS — O0993 Supervision of high risk pregnancy, unspecified, third trimester: Secondary | ICD-10-CM | POA: Diagnosis not present

## 2017-07-20 DIAGNOSIS — O09213 Supervision of pregnancy with history of pre-term labor, third trimester: Secondary | ICD-10-CM | POA: Diagnosis not present

## 2017-07-20 DIAGNOSIS — Z23 Encounter for immunization: Secondary | ICD-10-CM | POA: Diagnosis not present

## 2017-07-20 DIAGNOSIS — O99891 Other specified diseases and conditions complicating pregnancy: Secondary | ICD-10-CM

## 2017-07-20 DIAGNOSIS — O9989 Other specified diseases and conditions complicating pregnancy, childbirth and the puerperium: Secondary | ICD-10-CM

## 2017-07-20 DIAGNOSIS — M329 Systemic lupus erythematosus, unspecified: Secondary | ICD-10-CM

## 2017-07-20 DIAGNOSIS — Z8751 Personal history of pre-term labor: Secondary | ICD-10-CM

## 2017-07-20 DIAGNOSIS — O0990 Supervision of high risk pregnancy, unspecified, unspecified trimester: Secondary | ICD-10-CM

## 2017-07-20 LAB — POCT URINALYSIS DIP (DEVICE)
BILIRUBIN URINE: NEGATIVE
GLUCOSE, UA: NEGATIVE mg/dL
Hgb urine dipstick: NEGATIVE
KETONES UR: 80 mg/dL — AB
NITRITE: NEGATIVE
Protein, ur: 30 mg/dL — AB
Specific Gravity, Urine: 1.025 (ref 1.005–1.030)
Urobilinogen, UA: 0.2 mg/dL (ref 0.0–1.0)
pH: 6.5 (ref 5.0–8.0)

## 2017-07-20 MED ORDER — TETANUS-DIPHTH-ACELL PERTUSSIS 5-2.5-18.5 LF-MCG/0.5 IM SUSP
0.5000 mL | Freq: Once | INTRAMUSCULAR | Status: AC
  Administered 2017-07-20: 0.5 mL via INTRAMUSCULAR

## 2017-07-20 NOTE — Progress Notes (Signed)
   PRENATAL VISIT NOTE  Subjective:  Kayla [name] is a 26 y.o. G3P0110 at 5286w2d being seen today for ongoing prenatal care.  She is currently monitored for the following issues for this high-risk pregnancy and has Interstitial lung disease (HCC); Supervision of high risk pregnancy, antepartum; Systemic lupus erythematosus (SLE) affecting pregnancy, antepartum (HCC); Lupus (systemic lupus erythematosus) (HCC); Loud P2 (pulmonary S2, second heart sound); Insomnia; Other secondary pulmonary hypertension (HCC); Chronic hypertension; Chronic hypertension during pregnancy, antepartum; Sjogren's syndrome (HCC); Chronic Respiratory failure with oxygen requirement affecting pregnancy, antepartum; SS-A antibody positive; SS-B antibody positive; History of IUFD; and Gonorrhea affecting pregnancy on her problem list. She states that MFM recommended a f/u echo during third trimester given history. Patient states that she is very anxious during this pregnancy due to her history of IUFD.   Patient reports no complaints.  Contractions: Not present. Vag. Bleeding: None.  Movement: Present. Denies leaking of fluid.   The following portions of the patient's history were reviewed and updated as appropriate: allergies, current medications, past family history, past medical history, past social history, past surgical history and problem list. Problem list updated.  Objective:   Vitals:   07/20/17 1422  BP: 120/79  Pulse: 91  Weight: 181 lb 14.4 oz (82.5 kg)    Fetal Status: Fetal Heart Rate (bpm): 142   Movement: Present Fundal Height: 29cm    General:  Alert, oriented and cooperative. Patient is in no acute distress.  Skin: Skin is warm and dry. No rash noted.   Cardiovascular: Normal heart rate and rhythm, split S2 heard, no murmurs  Respiratory: Normal respiratory effort, no problems with respiration noted  Abdomen: Soft, gravid, appropriate for gestational age. Pain/Pressure: Present     Pelvic:  Cervical  exam deferred        Extremities: Normal range of motion.  Edema: Trace  Mental Status: Normal mood and affect. Normal behavior. Normal judgment and thought content.   Assessment and Plan:  Pregnancy: G3P0110 at 10386w2d  1. Supervision of high risk pregnancy, antepartum - 2hr GTT today - Labs today: RPR, CBC, HIV antibody (with reflex) - Begin weekly antenatal testing at 32 weeks - Repeat growth US every 4 weeks (next scheduled 9/20) - RTC in 2 weeks  2. History of preterm delivery - Was induced at 35wks due to IUFD, did not have preterm labor - [name] and 17P not indicated  3. Sjogren's syndrome, with unspecified organ involvement (HCC) - Sees rheumatology and pulmonary for f/u - Albuterol inhaler refilled prn  4. Chronic hypertension during pregnancy, antepartum - Normal BP - continue ASA   5. Systemic lupus erythematosus, unspecified SLE type, unspecified organ involvement status (HCC) - Continue prednisone and plaquinel  - Normal fetal echo on 8/23   Preterm labor symptoms and general obstetric precautions including but not limited to vaginal bleeding, contractions, DVT, leaking of fluid and fetal movement were reviewed in detail with the patient. Please refer to After Visit Summary for other counseling recommendations.  Return in about 2 weeks (around 08/03/2017).  Dannette Barbararew Camber Ninh, Medical Student

## 2017-07-20 NOTE — Progress Notes (Signed)
Per Dr. Debroah LoopArnold pt does not need refill on [name] due to pt not meeting critiria.  Pt notified.  Spoke with [place], from [facility], and canceled pt's [name] order.

## 2017-07-21 LAB — CBC
HEMATOCRIT: 36.4 % (ref 34.0–46.6)
Hemoglobin: 12.1 g/dL (ref 11.1–15.9)
MCH: 26.6 pg (ref 26.6–33.0)
MCHC: 33.2 g/dL (ref 31.5–35.7)
MCV: 80 fL (ref 79–97)
Platelets: 182 10*3/uL (ref 150–379)
RBC: 4.55 x10E6/uL (ref 3.77–5.28)
RDW: 13.6 % (ref 12.3–15.4)
WBC: 4.8 10*3/uL (ref 3.4–10.8)

## 2017-07-21 LAB — RPR: RPR Ser Ql: NONREACTIVE

## 2017-07-21 LAB — HIV ANTIBODY (ROUTINE TESTING W REFLEX): HIV Screen 4th Generation wRfx: NONREACTIVE

## 2017-07-21 LAB — GLUCOSE TOLERANCE, 2 HOURS W/ 1HR
GLUCOSE, FASTING: 65 mg/dL (ref 65–91)
Glucose, 1 hour: 107 mg/dL (ref 65–179)
Glucose, 2 hour: 78 mg/dL (ref 65–152)

## 2017-07-27 ENCOUNTER — Inpatient Hospital Stay (HOSPITAL_COMMUNITY): Payer: Medicare Other

## 2017-07-27 ENCOUNTER — Inpatient Hospital Stay (HOSPITAL_COMMUNITY)
Admission: AD | Admit: 2017-07-27 | Discharge: 2017-07-28 | Disposition: A | Discharge status: Home / Self Care | Payer: Medicare Other | Source: Ambulatory Visit | Attending: Emergency Medicine | Admitting: Emergency Medicine

## 2017-07-27 ENCOUNTER — Encounter (HOSPITAL_COMMUNITY): Payer: Self-pay

## 2017-07-27 DIAGNOSIS — M329 Systemic lupus erythematosus, unspecified: Secondary | ICD-10-CM | POA: Diagnosis not present

## 2017-07-27 DIAGNOSIS — O9989 Other specified diseases and conditions complicating pregnancy, childbirth and the puerperium: Secondary | ICD-10-CM | POA: Diagnosis not present

## 2017-07-27 DIAGNOSIS — Z87891 Personal history of nicotine dependence: Secondary | ICD-10-CM | POA: Diagnosis not present

## 2017-07-27 DIAGNOSIS — O26893 Other specified pregnancy related conditions, third trimester: Secondary | ICD-10-CM | POA: Diagnosis not present

## 2017-07-27 DIAGNOSIS — Z7982 Long term (current) use of aspirin: Secondary | ICD-10-CM | POA: Diagnosis not present

## 2017-07-27 DIAGNOSIS — R079 Chest pain, unspecified: Secondary | ICD-10-CM | POA: Diagnosis present

## 2017-07-27 DIAGNOSIS — Z3A30 30 weeks gestation of pregnancy: Secondary | ICD-10-CM | POA: Diagnosis not present

## 2017-07-27 DIAGNOSIS — R0602 Shortness of breath: Secondary | ICD-10-CM

## 2017-07-27 LAB — CBC
HEMATOCRIT: 35.5 % — AB (ref 36.0–46.0)
HEMOGLOBIN: 12.2 g/dL (ref 12.0–15.0)
MCH: 28 pg (ref 26.0–34.0)
MCHC: 34.4 g/dL (ref 30.0–36.0)
MCV: 81.6 fL (ref 78.0–100.0)
Platelets: 191 10*3/uL (ref 150–400)
RBC: 4.35 MIL/uL (ref 3.87–5.11)
RDW: 13 % (ref 11.5–15.5)
WBC: 5.5 10*3/uL (ref 4.0–10.5)

## 2017-07-27 LAB — TROPONIN I

## 2017-07-27 MED ORDER — ASPIRIN 81 MG PO CHEW
324.0000 mg | CHEWABLE_TABLET | Freq: Once | ORAL | Status: AC
  Administered 2017-07-27: 324 mg via ORAL
  Filled 2017-07-27 (×2): qty 4

## 2017-07-27 MED ORDER — ACETAMINOPHEN 500 MG PO TABS
1000.0000 mg | ORAL_TABLET | Freq: Once | ORAL | Status: AC
  Administered 2017-07-27: 1000 mg via ORAL
  Filled 2017-07-27: qty 2

## 2017-07-27 MED ORDER — NITROGLYCERIN 0.4 MG SL SUBL
0.4000 mg | SUBLINGUAL_TABLET | SUBLINGUAL | Status: DC | PRN
  Administered 2017-07-27: 0.4 mg via SUBLINGUAL
  Filled 2017-07-27: qty 1

## 2017-07-27 NOTE — ED Provider Notes (Signed)
[facility]-EMER[facility]ENCY DEPT Provider Note   CSN: [id number] Arrival date & time: 07/27/17  1818     History   Chief Complaint Chief Complaint  Patient presents with  . Chest Pain    HPI Kayla [name] is a 26 y.o. female.  This is a 26 year old G3 P0110 with IUP at 30.2 weeks and PMH significant for SLE, interstitial lung disease with pulmonary hypertension who presented earlier this evening for chest pain at [facility].  She stated the pain started approximately 1 hour prior to arrival and she states it is in the center of her chest between her breasts, initially sharp which turned into heavy pressure.  She denies any radiation of her pain, denies change in baseline from her shortness of breath given her chronic lung disease.  Denies any lightheadedness, blurry vision, headaches, numbness or tingling in her extremities, urinary symptoms.  No personal history of pulmonary embolism, she is not on blood thinners. No change in her daily steroids recently.    The history is provided by the patient.    Past Medical History:  Diagnosis Date  . Arthritis    "hands and legs" (01/08/2015)  . CAP (community acquired pneumonia) 01/07/2015  . Daily headache    "sometimes" (01/08/2015)  . GERD (gastroesophageal reflux disease)   . Hypertension   . Lung disease   . Lupus   . Pulmonary hypertension (HCC)   . Sjogren's syndrome [facility](HCC)     Patient Active Problem List   Diagnosis Date Noted  . Gonorrhea affecting pregnancy 02/17/2017  . History of IUFD 02/14/2017  . 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 Chronic Respiratory failure with oxygen requirement affecting pregnancy, antepartum 05/16/2016  . Chronic hypertension during pregnancy, antepartum 04/14/2016  . Chronic hypertension 03/14/2016  . 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 Lupus (systemic lupus erythematosus) (HCC) 08/21/2015  . Loud P2 (pulmonary S2, second heart sound)  08/21/2015  . Insomnia 08/21/2015  . Supervision of high risk pregnancy, antepartum 07/06/2015  . Systemic lupus erythematosus (SLE) affecting pregnancy, antepartum (HCC) 07/06/2015  . Sjogren's syndrome (HCC) 04/28/2015  . Interstitial lung disease (HCC) 02/06/2015    Past Surgical History:  Procedure Laterality Date  . CARDIAC CATHETERIZATION N/A 09/09/2015   Procedure: Right Heart Cath;  Surgeon: Laurey Moralealton S McLean, MD;  [facility];  Service: Cardiovascular;  Laterality: N/A;  . DILATION AND EVACUATION N/A 07/13/2015   Procedure: DILATATION AND EVACUATION;  Surgeon: Levie HeritageJacob J Stinson, DO;  Location: [facility] ORS;  Service: Gynecology;  Laterality: N/A;  . FINGER SURGERY Right 03/2014   "laceration, nerve/artery injury" 2nd digit  . VIDEO BRONCHOSCOPY Bilateral 01/12/2015   Procedure: VIDEO BRONCHOSCOPY WITH FLUORO;  Surgeon: Leslye Peerobert S Byrum, MD;  [facility];  Service: Cardiopulmonary;  Laterality: Bilateral;    OB History    Gravida Para Term Preterm AB Living   3 1 0 1 1 0   SAB TAB Ectopic Multiple Live Births   1 0 0 0 0       Home Medications    Prior to Admission medications   Medication Sig Start Date End Date Taking? Authorizing Provider  albuterol (PROVENTIL HFA;VENTOLIN HFA) 108 (90 Base) MCG/ACT inhaler Inhale 2 puffs into the lungs every 4 (four) hours as needed for wheezing or shortness of breath. 06/09/17   Adam PhenixArnold, James G, MD  aspirin EC 81 MG tablet Take 1 tablet (81 mg total) by mouth daily. 03/13/17   Elsie LincolnLeggett, Kelly  H, MD  fluticasone (FLONASE) 50 MCG/ACT nasal spray Place 2 sprays into both nostrils daily. Patient taking differently: Place 2 sprays into both nostrils daily as needed for rhinitis.  04/12/17   Rasch, Victorino Dike I, NP  folic acid (FOLVITE) 1 MG tablet Take 1 tablet (1 mg total) by mouth daily. 04/13/17   Allie Bossier, MD  hydroxychloroquine (PLAQUENIL) 200 MG tablet Take 200 mg by mouth daily.     [provider]  predniSONE  (DELTASONE) 5 MG tablet Take 10 mg by mouth daily with breakfast.     [provider]  Prenatal Vit-Fe Fumarate-FA (PRENATAL COMPLETE) 14-0.4 MG TABS Take 1 tablet by mouth daily. 01/30/17   Shaune Pollack, MD    Family History Family History  Problem Relation Age of Onset  . Diabetes Mother   . Diabetes Maternal Aunt   . Diabetes Maternal Grandmother     Social History Social History  Substance Use Topics  . Smoking status: Former Smoker    Packs/day: 0.10    Years: 5.00    Types: Cigarettes    Quit date: 11/20/2014  . Smokeless tobacco: Never Used  . Alcohol use No     Allergies   Hydrocodone and Zithromax [azithromycin]   Review of Systems Review of Systems  Constitutional: Negative for chills and fever.  HENT: Negative for ear pain and sore throat.   Eyes: Negative for pain and visual disturbance.  Respiratory: Negative for cough, chest tightness, shortness of breath and wheezing.   Cardiovascular: Positive for chest pain and leg swelling. Negative for palpitations.  Gastrointestinal: Negative for abdominal pain and vomiting.  Genitourinary: Negative for difficulty urinating, dysuria, flank pain and hematuria.  Musculoskeletal: Negative for arthralgias, back pain, gait problem and joint swelling.  Skin: Negative for color change and rash.  Neurological: Negative for dizziness, seizures, syncope and headaches.  All other systems reviewed and are negative.    Physical Exam Updated Vital Signs BP 120/77   Pulse (!) 101   Temp 98.9 F (37.2 C) (Oral)   Resp 18   Ht  (1.6 m)   Wt 84.4 kg (186 lb)   LMP 12/27/2016   SpO2 94%   BMI 32.95 kg/m   Physical Exam  Constitutional: She appears well-developed and well-nourished. No distress.  HENT:  Head: Normocephalic and atraumatic.  Eyes: Pupils are equal, round, and reactive to light. Conjunctivae are normal.  Neck: Neck supple.  Cardiovascular: Normal rate and regular rhythm.   No murmur  heard. Pulmonary/Chest: Effort normal and breath sounds normal. No respiratory distress.  Abdominal: Soft. She exhibits distension. There is no tenderness.  Enlarged abdomen consistent with third trimester pregnancy  Musculoskeletal: She exhibits no edema.  Neurological: She is alert.  Skin: Skin is warm and dry.  Psychiatric: She has a normal mood and affect.  Nursing note and vitals reviewed.    ED Treatments / Results  Labs (all labs ordered are listed, but only abnormal results are displayed) Labs Reviewed  CBC - Abnormal; Notable for the following:       Result Value   HCT 35.5 (*)    All other components within normal limits  TROPONIN I  TROPONIN I    EKG  EKG Interpretation None       Radiology Dg Chest Port 1 View  Result Date: 07/27/2017 CLINICAL DATA:  Shortness breath and cough starting today EXAM: PORTABLE CHEST 1 VIEW COMPARISON:  June 29, 2017, January 21, 2016 FINDINGS: The heart size  and mediastinal contours are within normal limits. Plan like opacities are identified in bilateral mid lung unchanged. Increased pulmonary interstitium is identified bilaterally at probably chronic. The visualized skeletal structures are unremarkable. IMPRESSION: Stable diffuse increased interstitium and unchanged appearance of than like opacities in bilateral mid lung likely secondary to chronic interstitial lung disease. No acute abnormality is noted. Electronically Signed   By: Sherian Rein M.D.   On: 07/27/2017 19:41    Procedures Procedures (including critical care time)  Medications Ordered in ED Medications  nitroGLYCERIN (NITROSTAT) SL tablet 0.4 mg (0.4 mg Sublingual Given 07/27/17 1905)  aspirin chewable tablet 324 mg (324 mg Oral Given 07/27/17 1904)  acetaminophen (TYLENOL) tablet 1,000 mg (1,000 mg Oral Given 07/27/17 2349)     Initial Impression / Assessment and Plan / ED Course  I have reviewed the triage vital signs and the nursing notes.  Pertinent labs &  imaging results that were available during my care of the patient were reviewed by me and considered in my medical decision making (see chart for details).     This is a 26 year old G3 P0110 with IUP at 30.2 weeks and PMH significant for SLE, interstitial lung disease with pulmonary hypertension who presented earlier this evening for chest pain at [facility].   While at [facility] L&D triage, patient had chest x-ray performed, troponin, CBC.  CXR shows evidence of chronic interstitial lung disease consistent with lupus and pulmonary hypertension.  Patient has adhered to daily prednisone dose of 5 mg.  No changes in her medication recently, she denies any increased swelling in her extremities, no joint pain noted.  No arthralgias or effusions noted on exam.  He denies any change in vision, no headaches.  Doubt preeclampsia at this time given normotensive pressures, troponins negative.  Because of the age and risk factors of the patient, ACS will be ruled out with troponin x 2. ASA was not given. Nitro was not given. Chest XR and EKG performed. EKG: normal sinus rhythm, unchanged from previous tracings. EKG reviewed by myself and the attending.   HEART score calculation: 1   Unlikely PNA as CXR shows no acute cardiopulmonary findings, no leukocytosis, no productive cough, no fever. Unlikely PE as atypical presentation, low risk per [place] (PE unlikely given presentation, no dyspnea).  Doubt Aortic Dissection, Pancreatitis, Arrhythmia, Pneumothorax, Endo/Myo/Pericarditis, ulcer perforation or other emergent pathology.  Labs and imaging reviewed by myself and considered in medical decision making.  Imaging interpreted by radiology.   Return precautions given. All questions answered.  Final Clinical Impressions(s) / ED Diagnoses   Final diagnoses:  Chest pain, unspecified type    New Prescriptions New Prescriptions   No medications on file     Shaune Pollack, MD 07/28/17  [phone]

## 2017-07-27 NOTE — MAU Note (Signed)
Pt having chest pain. Had some yesterday but it went away. Worse today, sharp pain between the breasts. 8/10

## 2017-07-27 NOTE — ED Notes (Signed)
Pt's family member reports [facility] d/t the need for troponin series which [facility].

## 2017-07-27 NOTE — ED Triage Notes (Signed)
[facility] d/t mid-cp which started today.  Pt reports pain is worse with inspiration, nothing makes it better.  Pt reports feeling the same pain yesterday which went away.  Pt describes pain as pressure radiating to back of her R shoulder.

## 2017-07-27 NOTE — MAU Provider Note (Signed)
History     CSN: [id number]  Arrival date and time: 07/27/17 1818   First Provider Initiated Contact with Patient 07/27/17 1852      Chief Complaint  Patient presents with  . Chest Pain   26 y.o. G3P0110 with IUP .2 weeks, and PMH significant for SLE, interstitial lung disease, pulmonary HTN,  here with chest pain. Pain started 1 hr prior to arrival. Describes as center of chest between breast, heavy pressure sitting on chest. No pain in jaw or arms but reports pain in rt shoulder onset same time. Endorses some mild SOB. Has not used anything for the pain. Onset of pain was when she was at rest. Denies lightheadedness, dizziness or diaphoresis. Hx of GERD and takes Zantac daily. Denies h/o blood clots. Denies LE swelling or pain. She is on immunosuppressants for SLE.   OB History    Gravida Para Term Preterm AB Living   3 1 0 1 1 0   SAB TAB Ectopic Multiple Live Births   1 0 0 0 0      Past Medical History:  Diagnosis Date  . Arthritis    "hands and legs" (01/08/2015)  . CAP (community acquired pneumonia) 01/07/2015  . Daily headache    "sometimes" (01/08/2015)  . GERD (gastroesophageal reflux disease)   . Hypertension   . Lung disease   . Lupus   . Pulmonary hypertension (HCC)   . Sjogren's syndrome [facility])     Past Surgical History:  Procedure Laterality Date  . CARDIAC CATHETERIZATION N/A 09/09/2015   Procedure: Right Heart Cath;  Surgeon: Laurey Morale, MD;  Location: [facility] INVASIVE CV LAB;  Service: Cardiovascular;  Laterality: N/A;  . DILATION AND EVACUATION N/A 07/13/2015   Procedure: DILATATION AND EVACUATION;  Surgeon: Levie Heritage, DO;  Location: [facility] ORS;  Service: Gynecology;  Laterality: N/A;  . FINGER SURGERY Right 03/2014   "laceration, nerve/artery injury" 2nd digit  . VIDEO BRONCHOSCOPY Bilateral 01/12/2015   Procedure: VIDEO BRONCHOSCOPY WITH FLUORO;  Surgeon: Leslye Peer, MD;  Location: [facility] ENDOSCOPY;  Service: Cardiopulmonary;  Laterality: Bilateral;     Family History  Problem Relation Age of Onset  . Diabetes Mother   . Diabetes Maternal Aunt   . Diabetes Maternal Grandmother     Social History  Substance Use Topics  . Smoking status: Former Smoker    Packs/day: 0.10    Years: 5.00    Types: Cigarettes    Quit date: 11/20/2014  . Smokeless tobacco: Never Used  . Alcohol use No    Allergies:  Allergies  Allergen Reactions  . Hydrocodone Nausea And Vomiting  . Zithromax [Azithromycin] Itching and Cough    Facility-Administered Medications Prior to Admission  Medication Dose Route Frequency Provider Last Rate Last Dose  . hydroxyprogesterone caproate ([name]) 250 mg/mL injection 250 mg  250 mg Intramuscular Weekly Reva Bores, MD   250 mg at 06/15/17 1300   Prescriptions Prior to Admission  Medication Sig Dispense Refill Last Dose  . albuterol (PROVENTIL HFA;VENTOLIN HFA) 108 (90 Base) MCG/ACT inhaler Inhale 2 puffs into the lungs every 4 (four) hours as needed for wheezing or shortness of breath. 1 Inhaler 5 Taking  . aspirin EC 81 MG tablet Take 1 tablet (81 mg total) by mouth daily. 30 tablet 2 Taking  . fluticasone (FLONASE) 50 MCG/ACT nasal spray Place 2 sprays into both nostrils daily. (Patient taking differently: Place 2 sprays into both nostrils daily as needed for rhinitis. ) 16 g  2 Taking  . folic acid (FOLVITE) 1 MG tablet Take 1 tablet (1 mg total) by mouth daily. 100 tablet 5 Taking  . hydroxychloroquine (PLAQUENIL) 200 MG tablet Take 200 mg by mouth daily.   10 Taking  . predniSONE (DELTASONE) 5 MG tablet Take 10 mg by mouth daily with breakfast.    Taking  . Prenatal Vit-Fe Fumarate-FA (PRENATAL COMPLETE) 14-0.4 MG TABS Take 1 tablet by mouth daily. 30 each 0 Taking    Review of Systems  Respiratory: Negative for shortness of breath.   Cardiovascular: Positive for chest pain.   Physical Exam   Blood pressure 119/76, pulse (!) 110, temperature 98.5 F (36.9 C), temperature source Oral, resp. rate  16, last menstrual period 12/27/2016, SpO2 100 %.  Physical Exam  Constitutional: She is oriented to person, place, and time. She appears well-developed and well-nourished. No distress.  HENT:  Head: Normocephalic and atraumatic.  Neck: Normal range of motion.  Cardiovascular: Normal rate, regular rhythm and normal heart sounds.   tachy  Respiratory: Effort normal. No respiratory distress. She has no wheezes. She has no rales.  Musculoskeletal: Normal range of motion.  Neurological: She is alert and oriented to person, place, and time.  Skin: Skin is dry.  Psychiatric: She has a normal mood and affect.  EFM: 145 bpm, mod variability, +accels, no decels Toco: none  MAU Course  Procedures  MDM EKG, Labs, and meds ordered. Transfer of care given to Dr. Nira Retortegele. Donette LarryBhambri, Melanie, CNM  07/27/2017 7:22 PM   EKG reviewed, nonspecific ST segment changes. CXR obtained showig chronic changes c/w chronic interstitial lung disease, looks similar to prior CXR. No acute abnormalities noted.  CBC wnl. Troponin pending  Given patient's risk factor and need for additional medical work up indcluding cardiac enzymes, will transfer to main [facility]. D/w attending physician, Dr. Jolayne Pantheronstant who agrees. [facility], who has accepted transfer of care   Assessment and Plan   26 y.o. [phone] [id number] with IUP at 5053w2d, who has a PMH significant for SLE (on immunosuppressant), presenting with chest pain. Work up as above  --Transfer to [facility] [place]. Accepted by Dr. Alyce PaganIsaccs.   Raynelle FanningJulie P. Degele, MD OB Fellow

## 2017-07-27 NOTE — MAU Note (Signed)
Urine in the lab  

## 2017-07-28 LAB — TROPONIN I: Troponin I: <0.03 ng/mL (ref <0.03)

## 2017-07-28 NOTE — ED Provider Notes (Addendum)
I have personally seen and examined the patient. I have reviewed the documentation on PMH/FH/Soc Hx. I have discussed the plan of care with the resident and patient.  I have reviewed and agree with the resident's documentation. Please see associated encounter note.   EKG Interpretation Vent. rate 101 BPM PR interval 142 ms QRS duration 86 ms QT/QTc 346/448 ms P-R-T axes 45 56 5  Sinus tachycardia Nonspecific ST and T wave abnormality Abnormal ECG No significant change since last tracing Confirmed by Drema Pryardama, Malli Falotico [id number]) on 07/27/2017 9:24:51 PM         Dijuan Sleeth, Amadeo GarnetPedro Eduardo, MD 07/28/17 [phone]

## 2017-08-07 ENCOUNTER — Encounter: Payer: Self-pay | Admitting: Obstetrics & Gynecology

## 2017-08-07 ENCOUNTER — Ambulatory Visit (INDEPENDENT_AMBULATORY_CARE_PROVIDER_SITE_OTHER): Payer: Medicare Other | Admitting: Obstetrics & Gynecology

## 2017-08-07 VITALS — BP 120/76 | HR 117 | Wt 184.4 lb

## 2017-08-07 DIAGNOSIS — O0990 Supervision of high risk pregnancy, unspecified, unspecified trimester: Secondary | ICD-10-CM

## 2017-08-07 DIAGNOSIS — O0993 Supervision of high risk pregnancy, unspecified, third trimester: Secondary | ICD-10-CM | POA: Diagnosis present

## 2017-08-07 DIAGNOSIS — O10919 Unspecified pre-existing hypertension complicating pregnancy, unspecified trimester: Secondary | ICD-10-CM

## 2017-08-07 DIAGNOSIS — O10913 Unspecified pre-existing hypertension complicating pregnancy, third trimester: Secondary | ICD-10-CM

## 2017-08-07 DIAGNOSIS — O368130 Decreased fetal movements, third trimester, not applicable or unspecified: Secondary | ICD-10-CM

## 2017-08-07 LAB — POCT URINALYSIS DIP (DEVICE)
BILIRUBIN URINE: NEGATIVE
GLUCOSE, UA: NEGATIVE mg/dL
HGB URINE DIPSTICK: NEGATIVE
Ketones, ur: >=160 mg/dL — AB
NITRITE: NEGATIVE
Protein, ur: 100 mg/dL — AB
Urobilinogen, UA: 0.2 mg/dL (ref 0.0–1.0)
pH: 6 (ref 5.0–8.0)

## 2017-08-07 NOTE — Progress Notes (Signed)
Patient with irregular contractions during NST.  NST performed today was reviewed and was found to be reactive.  Cervical exam: FT/long/high. PTL precautions advised.  Jaynie Collins, MD

## 2017-08-07 NOTE — Progress Notes (Signed)
   PRENATAL VISIT NOTE  Subjective:  Kayla [name] is a 26 y.o. G3P0110 at [redacted]w[redacted]d being seen today for ongoing prenatal care.  She is currently monitored for the following issues for this high-risk pregnancy and has Interstitial lung disease (HCC); Supervision of high risk pregnancy, antepartum; Systemic lupus erythematosus (SLE) affecting pregnancy, antepartum (HCC); Lupus (systemic lupus erythematosus) (HCC); Loud P2 (pulmonary S2, second heart sound); Insomnia; Other secondary pulmonary hypertension (HCC); Chronic hypertension; Chronic hypertension during pregnancy, antepartum; Sjogren's syndrome (HCC); Chronic Respiratory failure with oxygen requirement affecting pregnancy, antepartum; SS-A antibody positive; SS-B antibody positive; History of IUFD; and Gonorrhea affecting pregnancy on her problem list.  Patient reports change in fetal movement.  Contractions: Irritability. Vag. Bleeding: None.  Movement: Present. Denies leaking of fluid.   The following portions of the patient's history were reviewed and updated as appropriate: allergies, current medications, past family history, past medical history, past social history, past surgical history and problem list. Problem list updated.  Objective:   Vitals:   08/07/17 1540  BP: 120/76  Pulse: (!) 117  Weight: 184 lb 6.4 oz (83.6 kg)    Fetal Status: Fetal Heart Rate (bpm): 158   Movement: Present     General:  Alert, oriented and cooperative. Patient is in no acute distress.  Skin: Skin is warm and dry. No rash noted.   Cardiovascular: Normal heart rate noted  Respiratory: Normal respiratory effort, no problems with respiration noted  Abdomen: Soft, gravid, appropriate for gestational age.  Pain/Pressure: Present     Pelvic: Cervical exam deferred        Extremities: Normal range of motion.  Edema: Trace  Mental Status:  Normal mood and affect. Normal behavior. Normal judgment and thought content.   Assessment and Plan:  Pregnancy:  G3P0110 at [redacted]w[redacted]d  1. Supervision of high risk pregnancy, antepartum To be done at f/u US in 3 days, will perform NST today - [place] MFM FETAL BPP WO NON STRESS; Future  2. Chronic hypertension during pregnancy, antepartum  - [place] MFM FETAL BPP WO NON STRESS; Future  Preterm labor symptoms and general obstetric precautions including but not limited to vaginal bleeding, contractions, leaking of fluid and fetal movement were reviewed in detail with the patient. Please refer to After Visit Summary for other counseling recommendations.  Return in about 1 week (around 08/14/2017).   Scheryl Darter, MD

## 2017-08-07 NOTE — Progress Notes (Signed)
Pt felt good FM during NST.  She is aware of UC's and states she has been having them for 3-4 days. She feels abdominal tightening and back pain.

## 2017-08-07 NOTE — Patient Instructions (Signed)
Third Trimester of Pregnancy The third trimester is from week 28 through week 40 (months 7 through 9). The third trimester is a time when the unborn baby (fetus) is growing rapidly. At the end of the ninth month, the fetus is about 20 inches in length and weighs 6-10 pounds. Body changes during your third trimester Your body will continue to go through many changes during pregnancy. The changes vary from woman to woman. During the third trimester:  Your weight will continue to increase. You can expect to gain 25-35 pounds (11-16 kg) by the end of the pregnancy.  You may begin to get stretch marks on your hips, abdomen, and breasts.  You may urinate more often because the fetus is moving lower into your pelvis and pressing on your bladder.  You may develop or continue to have heartburn. This is caused by increased hormones that slow down muscles in the digestive tract.  You may develop or continue to have constipation because increased hormones slow digestion and cause the muscles that push waste through your intestines to relax.  You may develop hemorrhoids. These are swollen veins (varicose veins) in the rectum that can itch or be painful.  You may develop swollen, bulging veins (varicose veins) in your legs.  You may have increased body aches in the pelvis, back, or thighs. This is due to weight gain and increased hormones that are relaxing your joints.  You may have changes in your hair. These can include thickening of your hair, rapid growth, and changes in texture. Some women also have hair loss during or after pregnancy, or hair that feels dry or thin. Your hair will most likely return to normal after your baby is born.  Your breasts will continue to grow and they will continue to become tender. A yellow fluid (colostrum) may leak from your breasts. This is the first milk you are producing for your baby.  Your belly button may stick out.  You may notice more swelling in your hands,  face, or ankles.  You may have increased tingling or numbness in your hands, arms, and legs. The skin on your belly may also feel numb.  You may feel short of breath because of your expanding uterus.  You may have more problems sleeping. This can be caused by the size of your belly, increased need to urinate, and an increase in your body's metabolism.  You may notice the fetus "dropping," or moving lower in your abdomen (lightening).  You may have increased vaginal discharge.  You may notice your joints feel loose and you may have pain around your pelvic bone.  What to expect at prenatal visits You will have prenatal exams every 2 weeks until week 36. Then you will have weekly prenatal exams. During a routine prenatal visit:  You will be weighed to make sure you and the baby are growing normally.  Your blood pressure will be taken.  Your abdomen will be measured to track your baby's growth.  The fetal heartbeat will be listened to.  Any test results from the previous visit will be discussed.  You may have a cervical check near your due date to see if your cervix has softened or thinned (effaced).  You will be tested for Group B streptococcus. This happens between 35 and 37 weeks.  Your health care provider may ask you:  What your birth plan is.  How you are feeling.  If you are feeling the baby move.  If you have had   any abnormal symptoms, such as leaking fluid, bleeding, severe headaches, or abdominal cramping.  If you are using any tobacco products, including cigarettes, chewing tobacco, and electronic cigarettes.  If you have any questions.  Other tests or screenings that may be performed during your third trimester include:  Blood tests that check for low iron levels (anemia).  Fetal testing to check the health, activity level, and growth of the fetus. Testing is done if you have certain medical conditions or if there are problems during the  pregnancy.  Nonstress test (NST). This test checks the health of your baby to make sure there are no signs of problems, such as the baby not getting enough oxygen. During this test, a belt is placed around your belly. The baby is made to move, and its heart rate is monitored during movement.  What is false labor? False labor is a condition in which you feel small, irregular tightenings of the muscles in the womb (contractions) that usually go away with rest, changing position, or drinking water. These are called Braxton Hicks contractions. Contractions may last for hours, days, or even weeks before true labor sets in. If contractions come at regular intervals, become more frequent, increase in intensity, or become painful, you should see your health care provider. What are the signs of labor?  Abdominal cramps.  Regular contractions that start at 10 minutes apart and become stronger and more frequent with time.  Contractions that start on the top of the uterus and spread down to the lower abdomen and back.  Increased pelvic pressure and dull back pain.  A watery or bloody mucus discharge that comes from the vagina.  Leaking of amniotic fluid. This is also known as your "water breaking." It could be a slow trickle or a gush. Let your health care provider know if it has a color or strange odor. If you have any of these signs, call your health care provider right away, even if it is before your due date. Follow these instructions at home: Medicines  Follow your health care provider's instructions regarding medicine use. Specific medicines may be either safe or unsafe to take during pregnancy.  Take a prenatal vitamin that contains at least 600 micrograms (mcg) of folic acid.  If you develop constipation, try taking a stool softener if your health care provider approves. Eating and drinking  Eat a balanced diet that includes fresh fruits and vegetables, whole grains, good sources of protein  such as meat, eggs, or tofu, and low-fat dairy. Your health care provider will help you determine the amount of weight gain that is right for you.  Avoid raw meat and uncooked cheese. These carry germs that can cause birth defects in the baby.  If you have low calcium intake from food, talk to your health care provider about whether you should take a daily calcium supplement.  Eat four or five small meals rather than three large meals a day.  Limit foods that are high in fat and processed sugars, such as fried and sweet foods.  To prevent constipation: ? Drink enough fluid to keep your urine clear or pale yellow. ? Eat foods that are high in fiber, such as fresh fruits and vegetables, whole grains, and beans. Activity  Exercise only as directed by your health care provider. Most women can continue their usual exercise routine during pregnancy. Try to exercise for 30 minutes at least 5 days a week. Stop exercising if you experience uterine contractions.  Avoid heavy   lifting.  Do not exercise in extreme heat or humidity, or at high altitudes.  Wear low-heel, comfortable shoes.  Practice good posture.  You may continue to have sex unless your health care provider tells you otherwise. Relieving pain and discomfort  Take frequent breaks and rest with your legs elevated if you have leg cramps or low back pain.  Take warm sitz baths to soothe any pain or discomfort caused by hemorrhoids. Use hemorrhoid cream if your health care provider approves.  Wear a good support bra to prevent discomfort from breast tenderness.  If you develop varicose veins: ? Wear support pantyhose or compression stockings as told by your healthcare provider. ? Elevate your feet for 15 minutes, 3-4 times a day. Prenatal care  Write down your questions. Take them to your prenatal visits.  Keep all your prenatal visits as told by your health care provider. This is important. Safety  Wear your seat belt at  all times when driving.  Make a list of emergency phone numbers, including numbers for family, friends, the hospital, and police and fire departments. General instructions  Avoid cat litter boxes and soil used by cats. These carry germs that can cause birth defects in the baby. If you have a cat, ask someone to clean the litter box for you.  Do not travel far distances unless it is absolutely necessary and only with the approval of your health care provider.  Do not use hot tubs, steam rooms, or saunas.  Do not drink alcohol.  Do not use any products that contain nicotine or tobacco, such as cigarettes and e-cigarettes. If you need help quitting, ask your health care provider.  Do not use any medicinal herbs or unprescribed drugs. These chemicals affect the formation and growth of the baby.  Do not douche or use tampons or scented sanitary pads.  Do not cross your legs for long periods of time.  To prepare for the arrival of your baby: ? Take prenatal classes to understand, practice, and ask questions about labor and delivery. ? Make a trial run to the hospital. ? Visit the hospital and tour the maternity area. ? Arrange for maternity or paternity leave through employers. ? Arrange for family and friends to take care of pets while you are in the hospital. ? Purchase a rear-facing car seat and make sure you know how to install it in your car. ? Pack your hospital bag. ? Prepare the baby's nursery. Make sure to remove all pillows and stuffed animals from the baby's crib to prevent suffocation.  Visit your dentist if you have not gone during your pregnancy. Use a soft toothbrush to brush your teeth and be gentle when you floss. Contact a health care provider if:  You are unsure if you are in labor or if your water has broken.  You become dizzy.  You have mild pelvic cramps, pelvic pressure, or nagging pain in your abdominal area.  You have lower back pain.  You have persistent  nausea, vomiting, or diarrhea.  You have an unusual or bad smelling vaginal discharge.  You have pain when you urinate. Get help right away if:  Your water breaks before 37 weeks.  You have regular contractions less than 5 minutes apart before 37 weeks.  You have a fever.  You are leaking fluid from your vagina.  You have spotting or bleeding from your vagina.  You have severe abdominal pain or cramping.  You have rapid weight loss or weight gain.    You have shortness of breath with chest pain.  You notice sudden or extreme swelling of your face, hands, ankles, feet, or legs.  Your baby makes fewer than 10 movements in 2 hours.  You have severe headaches that do not go away when you take medicine.  You have vision changes. Summary  The third trimester is from week 28 through week 40, months 7 through 9. The third trimester is a time when the unborn baby (fetus) is growing rapidly.  During the third trimester, your discomfort may increase as you and your baby continue to gain weight. You may have abdominal, leg, and back pain, sleeping problems, and an increased need to urinate.  During the third trimester your breasts will keep growing and they will continue to become tender. A yellow fluid (colostrum) may leak from your breasts. This is the first milk you are producing for your baby.  False labor is a condition in which you feel small, irregular tightenings of the muscles in the womb (contractions) that eventually go away. These are called Braxton Hicks contractions. Contractions may last for hours, days, or even weeks before true labor sets in.  Signs of labor can include: abdominal cramps; regular contractions that start at 10 minutes apart and become stronger and more frequent with time; watery or bloody mucus discharge that comes from the vagina; increased pelvic pressure and dull back pain; and leaking of amniotic fluid. This information is not intended to replace advice  given to you by your health care provider. Make sure you discuss any questions you have with your health care provider. Document Released: 11/01/2001 Document Revised: 04/14/2016 Document Reviewed: 01/08/2013 Elsevier Interactive Patient Education  2017 Elsevier Inc.  

## 2017-08-10 ENCOUNTER — Encounter (HOSPITAL_COMMUNITY): Payer: Self-pay

## 2017-08-10 ENCOUNTER — Ambulatory Visit (HOSPITAL_COMMUNITY)
Admission: RE | Admit: 2017-08-10 | Discharge: 2017-08-10 | Disposition: A | Discharge status: Home / Self Care | Payer: Medicare Other | Source: Ambulatory Visit | Attending: Obstetrics & Gynecology | Admitting: Obstetrics & Gynecology

## 2017-08-10 DIAGNOSIS — D6862 Lupus anticoagulant syndrome: Secondary | ICD-10-CM

## 2017-08-10 DIAGNOSIS — O368190 Decreased fetal movements, unspecified trimester, not applicable or unspecified: Secondary | ICD-10-CM | POA: Diagnosis not present

## 2017-08-10 DIAGNOSIS — O09293 Supervision of pregnancy with other poor reproductive or obstetric history, third trimester: Secondary | ICD-10-CM | POA: Diagnosis present

## 2017-08-10 DIAGNOSIS — O09893 Supervision of other high risk pregnancies, third trimester: Secondary | ICD-10-CM | POA: Insufficient documentation

## 2017-08-10 DIAGNOSIS — O1003 Pre-existing essential hypertension complicating the puerperium: Secondary | ICD-10-CM | POA: Insufficient documentation

## 2017-08-10 DIAGNOSIS — M3500 Sicca syndrome, unspecified: Secondary | ICD-10-CM | POA: Insufficient documentation

## 2017-08-10 DIAGNOSIS — Z3A32 32 weeks gestation of pregnancy: Secondary | ICD-10-CM | POA: Diagnosis not present

## 2017-08-10 DIAGNOSIS — M329 Systemic lupus erythematosus, unspecified: Secondary | ICD-10-CM | POA: Diagnosis not present

## 2017-08-10 DIAGNOSIS — O99113 Other diseases of the blood and blood-forming organs and certain disorders involving the immune mechanism complicating pregnancy, third trimester: Secondary | ICD-10-CM | POA: Diagnosis not present

## 2017-08-10 DIAGNOSIS — O2693 Pregnancy related conditions, unspecified, third trimester: Secondary | ICD-10-CM | POA: Insufficient documentation

## 2017-08-10 DIAGNOSIS — O99513 Diseases of the respiratory system complicating pregnancy, third trimester: Secondary | ICD-10-CM | POA: Diagnosis not present

## 2017-08-10 DIAGNOSIS — O0990 Supervision of high risk pregnancy, unspecified, unspecified trimester: Secondary | ICD-10-CM

## 2017-08-10 DIAGNOSIS — O26893 Other specified pregnancy related conditions, third trimester: Secondary | ICD-10-CM | POA: Diagnosis present

## 2017-08-10 DIAGNOSIS — J9690 Respiratory failure, unspecified, unspecified whether with hypoxia or hypercapnia: Secondary | ICD-10-CM | POA: Insufficient documentation

## 2017-08-10 DIAGNOSIS — O99112 Other diseases of the blood and blood-forming organs and certain disorders involving the immune mechanism complicating pregnancy, second trimester: Secondary | ICD-10-CM

## 2017-08-10 DIAGNOSIS — O10919 Unspecified pre-existing hypertension complicating pregnancy, unspecified trimester: Secondary | ICD-10-CM

## 2017-08-15 ENCOUNTER — Inpatient Hospital Stay (HOSPITAL_COMMUNITY)
Admission: AD | Admit: 2017-08-15 | Discharge: 2017-08-15 | Disposition: A | Discharge status: Home / Self Care | Payer: Medicare Other | Source: Ambulatory Visit | Attending: Family Medicine | Admitting: Family Medicine

## 2017-08-15 ENCOUNTER — Encounter (HOSPITAL_COMMUNITY): Payer: Self-pay | Admitting: *Deleted

## 2017-08-15 DIAGNOSIS — R509 Fever, unspecified: Secondary | ICD-10-CM | POA: Insufficient documentation

## 2017-08-15 DIAGNOSIS — Z87891 Personal history of nicotine dependence: Secondary | ICD-10-CM | POA: Diagnosis not present

## 2017-08-15 DIAGNOSIS — Z79899 Other long term (current) drug therapy: Secondary | ICD-10-CM | POA: Insufficient documentation

## 2017-08-15 DIAGNOSIS — R51 Headache: Secondary | ICD-10-CM | POA: Diagnosis not present

## 2017-08-15 DIAGNOSIS — K219 Gastro-esophageal reflux disease without esophagitis: Secondary | ICD-10-CM | POA: Insufficient documentation

## 2017-08-15 DIAGNOSIS — I272 Pulmonary hypertension, unspecified: Secondary | ICD-10-CM | POA: Insufficient documentation

## 2017-08-15 DIAGNOSIS — O99613 Diseases of the digestive system complicating pregnancy, third trimester: Secondary | ICD-10-CM | POA: Diagnosis not present

## 2017-08-15 DIAGNOSIS — A084 Viral intestinal infection, unspecified: Secondary | ICD-10-CM | POA: Diagnosis not present

## 2017-08-15 DIAGNOSIS — R197 Diarrhea, unspecified: Secondary | ICD-10-CM | POA: Diagnosis present

## 2017-08-15 DIAGNOSIS — Z7982 Long term (current) use of aspirin: Secondary | ICD-10-CM | POA: Insufficient documentation

## 2017-08-15 DIAGNOSIS — M329 Systemic lupus erythematosus, unspecified: Secondary | ICD-10-CM | POA: Diagnosis not present

## 2017-08-15 DIAGNOSIS — Z3A33 33 weeks gestation of pregnancy: Secondary | ICD-10-CM | POA: Insufficient documentation

## 2017-08-15 DIAGNOSIS — M3500 Sicca syndrome, unspecified: Secondary | ICD-10-CM | POA: Insufficient documentation

## 2017-08-15 DIAGNOSIS — O163 Unspecified maternal hypertension, third trimester: Secondary | ICD-10-CM | POA: Diagnosis not present

## 2017-08-15 DIAGNOSIS — O98513 Other viral diseases complicating pregnancy, third trimester: Secondary | ICD-10-CM | POA: Diagnosis not present

## 2017-08-15 DIAGNOSIS — O9989 Other specified diseases and conditions complicating pregnancy, childbirth and the puerperium: Secondary | ICD-10-CM | POA: Diagnosis not present

## 2017-08-15 DIAGNOSIS — R112 Nausea with vomiting, unspecified: Secondary | ICD-10-CM | POA: Insufficient documentation

## 2017-08-15 LAB — URINALYSIS, ROUTINE W REFLEX MICROSCOPIC
BILIRUBIN URINE: NEGATIVE
Glucose, UA: NEGATIVE mg/dL
Hgb urine dipstick: NEGATIVE
Ketones, ur: 5 mg/dL — AB
Nitrite: NEGATIVE
Protein, ur: NEGATIVE mg/dL
SPECIFIC GRAVITY, URINE: 1.012 (ref 1.005–1.030)
pH: 7 (ref 5.0–8.0)

## 2017-08-15 LAB — CBC
HCT: 35.1 % — ABNORMAL LOW (ref 36.0–46.0)
Hemoglobin: 11.9 g/dL — ABNORMAL LOW (ref 12.0–15.0)
MCH: 27.5 pg (ref 26.0–34.0)
MCHC: 33.9 g/dL (ref 30.0–36.0)
MCV: 81.3 fL (ref 78.0–100.0)
PLATELETS: 183 10*3/uL (ref 150–400)
RBC: 4.32 MIL/uL (ref 3.87–5.11)
RDW: 13 % (ref 11.5–15.5)
WBC: 4.8 10*3/uL (ref 4.0–10.5)

## 2017-08-15 LAB — WET PREP, GENITAL
Clue Cells Wet Prep HPF POC: NONE SEEN
SPERM: NONE SEEN
Trich, Wet Prep: NONE SEEN
Yeast Wet Prep HPF POC: NONE SEEN

## 2017-08-15 LAB — COMPREHENSIVE METABOLIC PANEL
ALT: 12 U/L — AB (ref 14–54)
AST: 19 U/L (ref 15–41)
Albumin: 3.3 g/dL — ABNORMAL LOW (ref 3.5–5.0)
Alkaline Phosphatase: 85 U/L (ref 38–126)
Anion gap: 9 (ref 5–15)
BUN: 5 mg/dL — AB (ref 6–20)
CHLORIDE: 107 mmol/L (ref 101–111)
CO2: 21 mmol/L — AB (ref 22–32)
CREATININE: 0.37 mg/dL — AB (ref 0.44–1.00)
Calcium: 8.7 mg/dL — ABNORMAL LOW (ref 8.9–10.3)
GFR calc Af Amer: >60 mL/min (ref >60)
GFR calc non Af Amer: >60 mL/min (ref >60)
Glucose, Bld: 78 mg/dL (ref 65–99)
Potassium: 3.4 mmol/L — ABNORMAL LOW (ref 3.5–5.1)
SODIUM: 137 mmol/L (ref 135–145)
TOTAL PROTEIN: 6.7 g/dL (ref 6.5–8.1)
Total Bilirubin: 0.5 mg/dL (ref 0.3–1.2)

## 2017-08-15 LAB — INFLUENZA PANEL BY PCR (TYPE A & B)
Influenza A By PCR: NEGATIVE
Influenza B By PCR: NEGATIVE

## 2017-08-15 MED ORDER — PROMETHAZINE HCL 25 MG PO TABS: 25.0000 mg | ORAL_TABLET | Freq: Four times a day (QID) | ORAL | 0 refills | Status: DC | PRN

## 2017-08-15 MED ORDER — SODIUM CHLORIDE 0.9 % IV BOLUS (SEPSIS)
1000.0000 mL | Freq: Once | INTRAVENOUS | Status: AC
  Administered 2017-08-15: 1000 mL via INTRAVENOUS

## 2017-08-15 MED ORDER — PROMETHAZINE HCL 25 MG/ML IJ SOLN
12.5000 mg | Freq: Once | INTRAMUSCULAR | Status: AC
  Administered 2017-08-15: 12.5 mg via INTRAVENOUS
  Filled 2017-08-15: qty 1

## 2017-08-15 MED ORDER — BUTALBITAL-APAP-CAFFEINE 50-325-40 MG PO TABS
2.0000 | ORAL_TABLET | Freq: Once | ORAL | Status: AC
  Administered 2017-08-15: 2 via ORAL
  Filled 2017-08-15: qty 2

## 2017-08-15 NOTE — MAU Note (Signed)
Headache for past 2 days--rating pain 10/10 Has tried tylenol at 11 with no relief Vaginal pressure N/V--4 episodes in past 24 hours Diarrhea--6 times in the past 24 hours

## 2017-08-15 NOTE — MAU Provider Note (Signed)
History     CSN: [id number]  Arrival date and time: 08/15/17 1426   None     Chief Complaint  Patient presents with  . Emesis  . Nausea  . Headache  . Diarrhea   26 yo G3P0110 at [redacted]w[redacted]d presents with nausea and vomiting x 2 days. Vomited 4 times and diarrhea 6 times. Due to nausea, she has not been able to eat very well. Admits to fever up to 102 deg F. No known sick contacts. Patient had contractions on Sunday and since that time has felt pelvic pressure. She denies contractions at this time. Admits to feeling baby move. Patient admits to seeing single streak of blood when she wiped 2 days ago, no bleeding since that time. Patient has history of IUFD at [redacted] weeks gestation.     OB History    Gravida Para Term Preterm AB Living   3 1 0 1 1 0   SAB TAB Ectopic Multiple Live Births   1 0 0 0 0      Past Medical History:  Diagnosis Date  . Arthritis    "hands and legs" (01/08/2015)  . CAP (community acquired pneumonia) 01/07/2015  . Daily headache    "sometimes" (01/08/2015)  . GERD (gastroesophageal reflux disease)   . Hypertension   . Lung disease   . Lupus   . Pulmonary hypertension (HCC)   . Sjogren's syndrome [facility])     Past Surgical History:  Procedure Laterality Date  . CARDIAC CATHETERIZATION N/A 09/09/2015   Procedure: Right Heart Cath;  Surgeon: Laurey Morale, MD;  Location: [facility] INVASIVE CV LAB;  Service: Cardiovascular;  Laterality: N/A;  . DILATION AND EVACUATION N/A 07/13/2015   Procedure: DILATATION AND EVACUATION;  Surgeon: Levie Heritage, DO;  Location: [facility] ORS;  Service: Gynecology;  Laterality: N/A;  . FINGER SURGERY Right 03/2014   "laceration, nerve/artery injury" 2nd digit  . VIDEO BRONCHOSCOPY Bilateral 01/12/2015   Procedure: VIDEO BRONCHOSCOPY WITH FLUORO;  Surgeon: Leslye Peer, MD;  Location: [facility] ENDOSCOPY;  Service: Cardiopulmonary;  Laterality: Bilateral;    Family History  Problem Relation Age of Onset  . Diabetes Mother   . Diabetes  Maternal Aunt   . Diabetes Maternal Grandmother     Social History  Substance Use Topics  . Smoking status: Former Smoker    Packs/day: 0.10    Years: 5.00    Types: Cigarettes    Quit date: 11/20/2014  . Smokeless tobacco: Never Used  . Alcohol use No    Allergies:  Allergies  Allergen Reactions  . Hydrocodone Nausea And Vomiting  . Zithromax [Azithromycin] Itching and Cough    Facility-Administered Medications Prior to Admission  Medication Dose Route Frequency Provider Last Rate Last Dose  . hydroxyprogesterone caproate ([name]) 250 mg/mL injection 250 mg  250 mg Intramuscular Weekly Reva Bores, MD   250 mg at 06/15/17 1300   Prescriptions Prior to Admission  Medication Sig Dispense Refill Last Dose  . albuterol (PROVENTIL HFA;VENTOLIN HFA) 108 (90 Base) MCG/ACT inhaler Inhale 2 puffs into the lungs every 4 (four) hours as needed for wheezing or shortness of breath. 1 Inhaler 5 Taking  . aspirin EC 81 MG tablet Take 1 tablet (81 mg total) by mouth daily. 30 tablet 2 Taking  . fluticasone (FLONASE) 50 MCG/ACT nasal spray Place 2 sprays into both nostrils daily. (Patient taking differently: Place 2 sprays into both nostrils daily as needed for rhinitis. ) 16 g 2 Taking  . folic  acid (FOLVITE) 1 MG tablet Take 1 tablet (1 mg total) by mouth daily. 100 tablet 5 Taking  . hydroxychloroquine (PLAQUENIL) 200 MG tablet Take 200 mg by mouth daily.   10 Taking  . predniSONE (DELTASONE) 5 MG tablet Take 10 mg by mouth daily with breakfast.    Taking  . Prenatal Vit-Fe Fumarate-FA (PRENATAL COMPLETE) 14-0.4 MG TABS Take 1 tablet by mouth daily. 30 each 0 Taking    Review of Systems  Constitutional: Positive for fever. Negative for chills and fatigue.  HENT: Negative for congestion and ear pain.   Eyes: Negative for discharge and itching.  Respiratory: Negative for cough and chest tightness.   Cardiovascular: Negative for chest pain and palpitations.  Gastrointestinal: Positive  for diarrhea, nausea and vomiting. Negative for abdominal pain.  Genitourinary: Negative for vaginal bleeding and vaginal discharge.  Neurological: Negative for dizziness and headaches.   Physical Exam   Blood pressure 114/69, pulse 97, temperature 98.1 F (36.7 C), temperature source Oral, resp. rate 18, weight 180 lb 1.9 oz (81.7 kg), last menstrual period 12/27/2016, SpO2 97 %.   FHT: 150, min variability, no acels, single decel, cat 2 Toco: no ctx  Physical Exam  Constitutional: She is oriented to person, place, and time. She appears well-developed and well-nourished.  Mild distress  HENT:  Head: Normocephalic and atraumatic.  Eyes: Pupils are equal, round, and reactive to light. EOM are normal.  Neck: Normal range of motion. Neck supple.  Cardiovascular: Normal rate, regular rhythm and normal heart sounds.   Respiratory: Effort normal and breath sounds normal.  GI: Soft. She exhibits no distension. There is no tenderness. There is no rebound.  Musculoskeletal: Normal range of motion. She exhibits no edema.  Neurological: She is alert and oriented to person, place, and time.  Skin: Skin is warm and dry.  Psychiatric: She has a normal mood and affect. Her behavior is normal.  cervix: 1/thick/-2  MAU Course  Procedures  MDM Will give IVF since patient is likely dehydrated. Reviewed most recent US and showed no previa, so will get wet prep and gc/chlamydia for blood 2 days ago. Do not see need for repeat US at this time. Will get influenza, cbc, and cmp. Suspect viral gastroenteritis. Will give anti-emetics and fioricet for headache. EFM shows cat 2 strip, so will continue to monitor.  EFM shows improved variability with acels, now category 1 strip. Cervix 1/thick/-2 but not having contractions. Patient given strict preterm labor precautions. Flu negative.   Assessment and Plan  1. Viral gastroenteritis- improved nausea and vomiting with phenergan. Supportive care. 2.  Pregnancy at [redacted]w[redacted]d- no in labor currently. Given strict labor precautions. High risk pregnancy, so needs close outpatient follow up.  [employer] 08/15/2017, 2:52 PM

## 2017-08-15 NOTE — Discharge Instructions (Signed)

## 2017-08-16 LAB — URINE CULTURE

## 2017-08-16 LAB — GC/CHLAMYDIA PROBE AMP (~~LOC~~) NOT AT ARMC
Chlamydia: NEGATIVE
Neisseria Gonorrhea: NEGATIVE

## 2017-08-17 ENCOUNTER — Ambulatory Visit: Payer: Self-pay

## 2017-08-17 ENCOUNTER — Ambulatory Visit (INDEPENDENT_AMBULATORY_CARE_PROVIDER_SITE_OTHER): Payer: Medicare Other | Admitting: Family Medicine

## 2017-08-17 ENCOUNTER — Ambulatory Visit (INDEPENDENT_AMBULATORY_CARE_PROVIDER_SITE_OTHER): Payer: Medicare Other | Admitting: *Deleted

## 2017-08-17 VITALS — BP 129/68 | HR 104 | Wt 181.0 lb

## 2017-08-17 DIAGNOSIS — O10913 Unspecified pre-existing hypertension complicating pregnancy, third trimester: Secondary | ICD-10-CM

## 2017-08-17 DIAGNOSIS — Z23 Encounter for immunization: Secondary | ICD-10-CM

## 2017-08-17 DIAGNOSIS — O9989 Other specified diseases and conditions complicating pregnancy, childbirth and the puerperium: Secondary | ICD-10-CM

## 2017-08-17 DIAGNOSIS — O0993 Supervision of high risk pregnancy, unspecified, third trimester: Secondary | ICD-10-CM | POA: Diagnosis not present

## 2017-08-17 DIAGNOSIS — M329 Systemic lupus erythematosus, unspecified: Secondary | ICD-10-CM

## 2017-08-17 DIAGNOSIS — O10919 Unspecified pre-existing hypertension complicating pregnancy, unspecified trimester: Secondary | ICD-10-CM

## 2017-08-17 DIAGNOSIS — O0990 Supervision of high risk pregnancy, unspecified, unspecified trimester: Secondary | ICD-10-CM

## 2017-08-17 DIAGNOSIS — R768 Other specified abnormal immunological findings in serum: Secondary | ICD-10-CM

## 2017-08-17 DIAGNOSIS — Z8759 Personal history of other complications of pregnancy, childbirth and the puerperium: Secondary | ICD-10-CM

## 2017-08-17 DIAGNOSIS — O99891 Other specified diseases and conditions complicating pregnancy: Secondary | ICD-10-CM

## 2017-08-17 LAB — POCT URINALYSIS DIP (DEVICE)
BILIRUBIN URINE: NEGATIVE
GLUCOSE, UA: NEGATIVE mg/dL
Hgb urine dipstick: NEGATIVE
KETONES UR: 40 mg/dL — AB
Nitrite: NEGATIVE
PH: 7 (ref 5.0–8.0)
Protein, ur: 30 mg/dL — AB
SPECIFIC GRAVITY, URINE: 1.025 (ref 1.005–1.030)
Urobilinogen, UA: 0.2 mg/dL (ref 0.0–1.0)

## 2017-08-17 NOTE — Patient Instructions (Signed)
AREA PEDIATRIC/FAMILY PRACTICE PHYSICIANS  [facility] [address], [address] [place], [place]  [address] Phone - [phone]   Fax - [phone]  [facility] [address] [place], [place] [address] Phone - [phone]   Fax - [phone]  [facility] [address] [place], [place]  [address] Phone - [phone]   Fax - [phone]  [facility] [address] [place], [place]  [address] Phone - [phone]   Fax - [phone]  [facility] [address] [place], [place]  [address] Phone - [phone]   Fax - [phone]  [facility] [address] [place], [place]  [address] Phone - [phone]   Fax - [phone]  [facility] [address] [place], [place]  [address] Phone - [phone]   Fax - [phone]  [facility] [address] [place], [place]  [address] Phone - [phone]   Fax - [phone]  [facility] [address] [place], [place]  [address] Phone - [phone]   Fax - [phone] [facility] [address] [place], [place]  [address] Phone - [phone]   Fax - [phone]  [facility] [address] [place], [place]  [address] Phone - [phone]   Fax - [phone]  [facility] [address] [place], [place]  [address] Phone - [phone]   Fax - [phone]  [facility] [address] [place], [place]  [address] Phone - [phone]   Fax - [phone]  [facility] [address] [place], [place]  [address] Phone - [phone]  [facility] [address] [place], [place]  [address] Phone - [phone]   Fax - [phone]  [facility] [address] [place], [place]  [address] Phone - [phone]   Fax - [phone]  [facility] [address] [place], [place]  [address] Phone - [phone]   Fax - [phone]  [facility] [address] [place], [place]  [address] Phone - [phone]   Fax - [phone]   [facility] [address] [place], [place]  [address] Phone - [phone]   Fax - [phone]  [facility] [address] [place], [place]  [address] Phone - [phone]   Fax - [phone]  [facility] [address] [place], [place]  [address] Phone - [phone]   Fax - [phone]  [facility] [address] [place], [place]  [address] Phone - [phone]   Fax - [phone]  [facility] [address] [place], [place]  [address] Phone - [phone]   Fax - [phone] [facility] [address] [place], [place]  [address] Phone - [phone]   Fax - [phone]  [facility] [address] [place], [place]  [address] Phone - [phone]   Fax - [phone]  [facility] [address] [place], [place]  [address] Phone - [phone]   Fax - [phone]  [facility] Charlene Flemming MD [address] [place]  [address] Phone [phone]  Fax [phone]   

## 2017-08-17 NOTE — Progress Notes (Addendum)
Pt informed that the ultrasound is considered a limited OB ultrasound and is not intended to be a complete ultrasound exam.  Patient also informed that the ultrasound is not being completed with the intent of assessing for fetal or placental anomalies or any pelvic abnormalities.  Explained that the purpose of today's ultrasound is to assess for presentation, BPP and amniotic fluid volume.  Patient acknowledges the purpose of the exam and the limitations of the study.      Pt informed of appt @ [facility] on 10/2 for consult for recommendation of timing of delivery of baby.

## 2017-08-17 NOTE — Progress Notes (Signed)
   PRENATAL VISIT NOTE  Subjective:  Kayla [name] is a 26 y.o. G3P0110 at [redacted]w[redacted]d being seen today for ongoing prenatal care.  She is currently monitored for the following issues for this high-risk pregnancy and has Interstitial lung disease (HCC); Supervision of high risk pregnancy, antepartum; Systemic lupus erythematosus (SLE) affecting pregnancy, antepartum (HCC); Lupus (systemic lupus erythematosus) (HCC); Loud P2 (pulmonary S2, second heart sound); Insomnia; Other secondary pulmonary hypertension (HCC); Chronic hypertension; Chronic hypertension during pregnancy, antepartum; Sjogren's syndrome (HCC); Chronic Respiratory failure with oxygen requirement affecting pregnancy, antepartum; SS-A antibody positive; SS-B antibody positive; History of IUFD; and Gonorrhea affecting pregnancy on her problem list.  Patient reports Pt concerned about fetal status. Having increased fatigue, similar to just before she lost the baby. She is very anxious and wants to deliver as soon as possible - doesn't want to wait until 39 weeks..  Contractions: Not present. Vag. Bleeding: None.  Movement: Present. Denies leaking of fluid.   The following portions of the patient's history were reviewed and updated as appropriate: allergies, current medications, past family history, past medical history, past social history, past surgical history and problem list. Problem list updated.  Objective:   Vitals:   08/17/17 1310  BP: 129/68  Pulse: (!) 104  Weight: 181 lb (82.1 kg)    Fetal Status: Fetal Heart Rate (bpm): NST   Movement: Present     General:  Alert, oriented and cooperative. Patient is in no acute distress.  Skin: Skin is warm and dry. No rash noted.   Cardiovascular: Normal heart rate noted  Respiratory: Normal respiratory effort, no problems with respiration noted  Abdomen: Soft, gravid, appropriate for gestational age.  Pain/Pressure: Present     Pelvic: Cervical exam deferred        Extremities:  Normal range of motion.  Edema: Trace  Mental Status:  Normal mood and affect. Normal behavior. Normal judgment and thought content.   Assessment and Plan:  Pregnancy: G3P0110 at [redacted]w[redacted]d  1. Supervision of high risk pregnancy, antepartum NST - reactive  2. Systemic lupus erythematosus (SLE) affecting pregnancy, antepartum (HCC) On plaquenil and prednisone Maternal echocardiogram recommended by MFM. Will order. - ECHOCARDIOGRAM COMPLETE; Future  3. Systemic lupus erythematosus, unspecified SLE type, unspecified organ involvement status (HCC)  4. Chronic hypertension during pregnancy, antepartum Controlled. ASA  Fetal growth appropriate.  Next growth [place] on 10/18. BPP: 10/10  5. Need for influenza vaccination - Flu Vaccine QUAD 36+ mos IM (Fluarix, Quad PF)  6. History of IUFD Continue testing. Patient requesting formal consult with MFM for timing of delivery.  7. SS-A antibody positive 8. SS-B antibody positive Fetal echo every other week.    Preterm labor symptoms and general obstetric precautions including but not limited to vaginal bleeding, contractions, leaking of fluid and fetal movement were reviewed in detail with the patient. Please refer to After Visit Summary for other counseling recommendations.  No Follow-up on file.   Levie Heritage, DO

## 2017-08-22 ENCOUNTER — Inpatient Hospital Stay (HOSPITAL_COMMUNITY): Payer: Medicare Other

## 2017-08-22 ENCOUNTER — Encounter (HOSPITAL_COMMUNITY): Payer: Self-pay | Admitting: *Deleted

## 2017-08-22 ENCOUNTER — Encounter (HOSPITAL_COMMUNITY): Payer: Self-pay

## 2017-08-22 ENCOUNTER — Ambulatory Visit (HOSPITAL_COMMUNITY)
Admission: RE | Admit: 2017-08-22 | Discharge: 2017-08-22 | Disposition: A | Discharge status: Home / Self Care | Payer: Medicare Other | Source: Ambulatory Visit | Attending: Family Medicine | Admitting: Family Medicine

## 2017-08-22 ENCOUNTER — Inpatient Hospital Stay (HOSPITAL_COMMUNITY)
Admission: AD | Admit: 2017-08-22 | Discharge: 2017-08-22 | Disposition: A | Discharge status: Home / Self Care | Payer: Medicare Other | Source: Ambulatory Visit | Attending: Obstetrics and Gynecology | Admitting: Obstetrics and Gynecology

## 2017-08-22 VITALS — BP 116/83 | HR 100 | Wt 182.0 lb

## 2017-08-22 DIAGNOSIS — Z7952 Long term (current) use of systemic steroids: Secondary | ICD-10-CM | POA: Insufficient documentation

## 2017-08-22 DIAGNOSIS — O9989 Other specified diseases and conditions complicating pregnancy, childbirth and the puerperium: Secondary | ICD-10-CM | POA: Insufficient documentation

## 2017-08-22 DIAGNOSIS — D6862 Lupus anticoagulant syndrome: Secondary | ICD-10-CM

## 2017-08-22 DIAGNOSIS — K929 Disease of digestive system, unspecified: Secondary | ICD-10-CM | POA: Insufficient documentation

## 2017-08-22 DIAGNOSIS — Z79899 Other long term (current) drug therapy: Secondary | ICD-10-CM | POA: Insufficient documentation

## 2017-08-22 DIAGNOSIS — M3500 Sicca syndrome, unspecified: Secondary | ICD-10-CM | POA: Insufficient documentation

## 2017-08-22 DIAGNOSIS — K219 Gastro-esophageal reflux disease without esophagitis: Secondary | ICD-10-CM

## 2017-08-22 DIAGNOSIS — O163 Unspecified maternal hypertension, third trimester: Secondary | ICD-10-CM | POA: Diagnosis not present

## 2017-08-22 DIAGNOSIS — Z7982 Long term (current) use of aspirin: Secondary | ICD-10-CM | POA: Diagnosis not present

## 2017-08-22 DIAGNOSIS — O26893 Other specified pregnancy related conditions, third trimester: Secondary | ICD-10-CM | POA: Insufficient documentation

## 2017-08-22 DIAGNOSIS — O99613 Diseases of the digestive system complicating pregnancy, third trimester: Secondary | ICD-10-CM | POA: Insufficient documentation

## 2017-08-22 DIAGNOSIS — O26899 Other specified pregnancy related conditions, unspecified trimester: Secondary | ICD-10-CM

## 2017-08-22 DIAGNOSIS — R197 Diarrhea, unspecified: Secondary | ICD-10-CM | POA: Insufficient documentation

## 2017-08-22 DIAGNOSIS — Z8759 Personal history of other complications of pregnancy, childbirth and the puerperium: Secondary | ICD-10-CM

## 2017-08-22 DIAGNOSIS — Z3689 Encounter for other specified antenatal screening: Secondary | ICD-10-CM

## 2017-08-22 DIAGNOSIS — Z87891 Personal history of nicotine dependence: Secondary | ICD-10-CM | POA: Insufficient documentation

## 2017-08-22 DIAGNOSIS — O368130 Decreased fetal movements, third trimester, not applicable or unspecified: Secondary | ICD-10-CM | POA: Insufficient documentation

## 2017-08-22 DIAGNOSIS — Z3A34 34 weeks gestation of pregnancy: Secondary | ICD-10-CM | POA: Diagnosis not present

## 2017-08-22 DIAGNOSIS — M549 Dorsalgia, unspecified: Secondary | ICD-10-CM | POA: Diagnosis not present

## 2017-08-22 DIAGNOSIS — M329 Systemic lupus erythematosus, unspecified: Secondary | ICD-10-CM | POA: Insufficient documentation

## 2017-08-22 DIAGNOSIS — M7918 Myalgia, other site: Secondary | ICD-10-CM

## 2017-08-22 DIAGNOSIS — O10913 Unspecified pre-existing hypertension complicating pregnancy, third trimester: Secondary | ICD-10-CM

## 2017-08-22 DIAGNOSIS — I272 Pulmonary hypertension, unspecified: Secondary | ICD-10-CM

## 2017-08-22 DIAGNOSIS — O99891 Other specified diseases and conditions complicating pregnancy: Secondary | ICD-10-CM

## 2017-08-22 DIAGNOSIS — O99113 Other diseases of the blood and blood-forming organs and certain disorders involving the immune mechanism complicating pregnancy, third trimester: Secondary | ICD-10-CM

## 2017-08-22 DIAGNOSIS — O288 Other abnormal findings on antenatal screening of mother: Secondary | ICD-10-CM

## 2017-08-22 LAB — URINALYSIS, ROUTINE W REFLEX MICROSCOPIC
BILIRUBIN URINE: NEGATIVE
GLUCOSE, UA: NEGATIVE mg/dL
Hgb urine dipstick: NEGATIVE
KETONES UR: 20 mg/dL — AB
Leukocytes, UA: NEGATIVE
Nitrite: NEGATIVE
PH: 7 (ref 5.0–8.0)
Protein, ur: NEGATIVE mg/dL
Specific Gravity, Urine: 1.001 — ABNORMAL LOW (ref 1.005–1.030)

## 2017-08-22 LAB — WET PREP, GENITAL
CLUE CELLS WET PREP: NONE SEEN
Sperm: NONE SEEN
Trich, Wet Prep: NONE SEEN
YEAST WET PREP: NONE SEEN

## 2017-08-22 MED ORDER — LOPERAMIDE HCL 2 MG PO TABS: 2.0000 mg | ORAL_TABLET | Freq: Four times a day (QID) | ORAL | 0 refills | Status: DC | PRN

## 2017-08-22 MED ORDER — CYCLOBENZAPRINE HCL 10 MG PO TABS: 5.0000 mg | ORAL_TABLET | Freq: Three times a day (TID) | ORAL | 0 refills | Status: DC | PRN

## 2017-08-22 NOTE — MAU Provider Note (Signed)
Chief Complaint:  Decreased Fetal Movement and Back Pain   First Provider Initiated Contact with Patient 08/22/17 0300      HPI: Kayla [name] is a 26 y.o. G3P0110 at [redacted]w[redacted]d with PMH significant for IUFD at 14 weeks,lupus, CHTN, and chronic lung disease who presents to maternity admissions reporting diarrhea and constant back pain x 2 days associated with less fetal movement than usual. She reports the diarrhea and pain started on the same day, with 4-5 episodes of watery stool daily. Her back pain is constant, mid back, radiating down to her left lower back.  Movement makes the pain worse and heating pad improves the pain but does not resolve it.  The pain and diarrhea are associated with vaginal discharge that is watery, requiring a pantyliner, and vaginal itching.  She has not tried any treatments for these associated symptoms. There are no other associated symptoms. She denies vaginal bleeding, vaginal itching/burning, urinary symptoms, h/a, dizziness, n/v, or fever/chills.    HPI  Past Medical History: Past Medical History:  Diagnosis Date  . Arthritis    "hands and legs" (01/08/2015)  . CAP (community acquired pneumonia) 01/07/2015  . Daily headache    "sometimes" (01/08/2015)  . GERD (gastroesophageal reflux disease)   . Hypertension   . Lung disease   . Lupus   . Pulmonary hypertension (HCC)   . Sjogren's syndrome (HCC)     Past obstetric history: OB History  Gravida Para Term Preterm AB Living  3 1 0 1 1 0  SAB TAB Ectopic Multiple Live Births  1 0 0 0 0    # Outcome Date GA Lbr Len/2nd Weight Sex Delivery Anes PTL Lv  3 Current           2 Preterm 06/2015 [redacted]w[redacted]d   F    FD  1 SAB  [redacted]w[redacted]d    SAB         Past Surgical History: Past Surgical History:  Procedure Laterality Date  . CARDIAC CATHETERIZATION N/A 09/09/2015   Procedure: Right Heart Cath;  Surgeon: Laurey Morale, MD;  Location: [facility] INVASIVE CV LAB;  Service: Cardiovascular;  Laterality: N/A;  . DILATION AND  EVACUATION N/A 07/13/2015   Procedure: DILATATION AND EVACUATION;  Surgeon: Levie Heritage, DO;  Location: [facility] ORS;  Service: Gynecology;  Laterality: N/A;  . FINGER SURGERY Right 03/2014   "laceration, nerve/artery injury" 2nd digit  . VIDEO BRONCHOSCOPY Bilateral 01/12/2015   Procedure: VIDEO BRONCHOSCOPY WITH FLUORO;  Surgeon: Leslye Peer, MD;  Location: [facility] ENDOSCOPY;  Service: Cardiopulmonary;  Laterality: Bilateral;    Family History: Family History  Problem Relation Age of Onset  . Diabetes Mother   . Diabetes Maternal Aunt   . Diabetes Maternal Grandmother     Social History: Social History  Substance Use Topics  . Smoking status: Former Smoker    Packs/day: 0.10    Years: 5.00    Types: Cigarettes    Quit date: 11/20/2014  . Smokeless tobacco: Never Used  . Alcohol use No    Allergies:  Allergies  Allergen Reactions  . Hydrocodone Nausea And Vomiting  . Zithromax [Azithromycin] Itching and Cough    Meds:  Facility-Administered Medications Prior to Admission  Medication Dose Route Frequency Provider Last Rate Last Dose  . hydroxyprogesterone caproate ([name]) 250 mg/mL injection 250 mg  250 mg Intramuscular Weekly Reva Bores, MD   250 mg at 06/15/17 1300   Prescriptions Prior to Admission  Medication Sig Dispense Refill Last  Dose  . albuterol (PROVENTIL HFA;VENTOLIN HFA) 108 (90 Base) MCG/ACT inhaler Inhale 2 puffs into the lungs every 4 (four) hours as needed for wheezing or shortness of breath. 1 Inhaler 5 08/21/2017 at Unknown time  . aspirin EC 81 MG tablet Take 1 tablet (81 mg total) by mouth daily. 30 tablet 2 08/21/2017 at Unknown time  . folic acid (FOLVITE) 1 MG tablet Take 1 tablet (1 mg total) by mouth daily. 100 tablet 5 08/21/2017 at Unknown time  . hydroxychloroquine (PLAQUENIL) 200 MG tablet Take 200 mg by mouth 2 (two) times daily.   10 08/21/2017 at Unknown time  . predniSONE (DELTASONE) 5 MG tablet Take 10 mg by mouth daily with breakfast.     08/21/2017 at Unknown time  . Prenatal Vit-Fe Fumarate-FA (PRENATAL COMPLETE) 14-0.4 MG TABS Take 1 tablet by mouth daily. 30 each 0 08/21/2017 at Unknown time  . promethazine (PHENERGAN) 25 MG tablet Take 1 tablet (25 mg total) by mouth every 6 (six) hours as needed for nausea or vomiting. 30 tablet 0 Past Week at Unknown time  . ranitidine (ZANTAC) 150 MG tablet Take 150 mg by mouth 2 (two) times daily as needed for heartburn.   08/21/2017 at Unknown time  . fluticasone (FLONASE) 50 MCG/ACT nasal spray Place 2 sprays into both nostrils daily. (Patient taking differently: Place 2 sprays into both nostrils daily as needed for rhinitis. ) 16 g 2 Taking    ROS:  Review of Systems  Constitutional: Negative for chills, fatigue and fever.  Eyes: Negative for visual disturbance.  Respiratory: Negative for shortness of breath.   Cardiovascular: Negative for chest pain.  Gastrointestinal: Positive for diarrhea. Negative for abdominal pain, nausea and vomiting.  Genitourinary: Positive for vaginal discharge. Negative for difficulty urinating, dysuria, flank pain, pelvic pain, vaginal bleeding and vaginal pain.  Musculoskeletal: Positive for back pain.  Neurological: Negative for dizziness and headaches.  Psychiatric/Behavioral: Negative.      I have reviewed patient's Past Medical Hx, Surgical Hx, Family Hx, Social Hx, medications and allergies.   Physical Exam   Patient Vitals for the past 24 hrs:  BP Temp Temp src Pulse Resp SpO2 Weight  08/22/17 0219 137/78 98.1 F (36.7 C) Oral 92 18 100 % 181 lb (82.1 kg)   Constitutional: Well-developed, well-nourished female in no acute distress.  Cardiovascular: normal rate Respiratory: normal effort GI: Abd soft, non-tender, gravid appropriate for gestational age.  MS: Extremities nontender, no edema, normal ROM, pain to palpation of mid back bilaterally Neurologic: Alert and oriented x 4.  GU: Neg CVAT.  PELVIC EXAM: Cervix pink, visually closed,  without lesion, small amount white creamy discharge, vaginal walls and external genitalia normal   Dilation: 1.5 Effacement (%): 50 Cervical Position: Posterior Station: -2 Presentation: Vertex Exam by:: LISA, CNM  FHT:  Baseline 145 , moderate variability, no accels present in first 30 minutes of tracing, ? Variable vs MHR while pt sitting up in bed Contractions: rare, mild to palpation   Labs: Results for orders placed or performed during the hospital encounter of 08/22/17 (from the past 24 hour(s))  Urinalysis, Routine w reflex microscopic     Status: Abnormal   Collection Time: 08/22/17  2:17 AM  Result Value Ref Range   Color, Urine COLORLESS (A) YELLOW   APPearance CLEAR CLEAR   Specific Gravity, Urine 1.001 (L) 1.005 - 1.030   pH 7.0 5.0 - 8.0   Glucose, UA NEGATIVE NEGATIVE mg/dL   Hgb urine dipstick NEGATIVE NEGATIVE  Bilirubin Urine NEGATIVE NEGATIVE   Ketones, ur 20 (A) NEGATIVE mg/dL   Protein, ur NEGATIVE NEGATIVE mg/dL   Nitrite NEGATIVE NEGATIVE   Leukocytes, UA NEGATIVE NEGATIVE  Wet prep, genital     Status: Abnormal   Collection Time: 08/22/17  3:00 AM  Result Value Ref Range   Yeast Wet Prep HPF POC NONE SEEN NONE SEEN   Trich, Wet Prep NONE SEEN NONE SEEN   Clue Cells Wet Prep HPF POC NONE SEEN NONE SEEN   WBC, Wet Prep HPF POC MANY (A) NONE SEEN   Sperm NONE SEEN       Imaging:  MFM BPP with preliminary result of 8/8  MAU Course/MDM: I have ordered labs and reviewed results.  NST reviewed and initially not reactive but became reactive with PO fluids BPP 8/8 so combined result of 10/10 No evidence of preterm labor today with cervix unchanged and rare contractions on toco Will treat back pain and diarrhea with Flexeril, heat, Tylenol, and imodium.  Pt to take home supplies for stool cultures to bring to office as soon as possible.   Preterm labor signs reviewed Pt stable at time of discharge.  Assessment: 1. NST (non-stress test) reactive   2.  NST (non-stress test) nonreactive   3. Decreased fetal movement, third trimester, not applicable or unspecified fetus   4. Back pain affecting pregnancy in third trimester   5. Musculoskeletal pain   6. Diarrhea during pregnancy     Plan: Discharge home Labor precautions and fetal kick counts  Follow-up Information    [facility] Follow up.   Specialty:  Obstetrics and Gynecology Why:  As scheduled, return to MAU as needed for emergencies Contact information: [address] [place] [address] [phone]         Allergies as of 08/22/2017      Reactions   Hydrocodone Nausea And Vomiting   Zithromax [azithromycin] Itching, Cough      Medication List    TAKE these medications   albuterol 108 (90 Base) MCG/ACT inhaler Commonly known as:  PROVENTIL HFA;VENTOLIN HFA Inhale 2 puffs into the lungs every 4 (four) hours as needed for wheezing or shortness of breath.   aspirin EC 81 MG tablet Take 1 tablet (81 mg total) by mouth daily.   cyclobenzaprine 10 MG tablet Commonly known as:  FLEXERIL Take 0.5-1 tablets (5-10 mg total) by mouth 3 (three) times daily as needed for muscle spasms.   fluticasone 50 MCG/ACT nasal spray Commonly known as:  FLONASE Place 2 sprays into both nostrils daily. What changed:  when to take this  reasons to take this   folic acid 1 MG tablet Commonly known as:  FOLVITE Take 1 tablet (1 mg total) by mouth daily.   hydroxychloroquine 200 MG tablet Commonly known as:  PLAQUENIL Take 200 mg by mouth 2 (two) times daily.   loperamide 2 MG tablet Commonly known as:  IMODIUM A-D Take 1 tablet (2 mg total) by mouth 4 (four) times daily as needed for diarrhea or loose stools.   predniSONE 5 MG tablet Commonly known as:  DELTASONE Take 10 mg by mouth daily with breakfast.   PRENATAL COMPLETE 14-0.4 MG Tabs Take 1 tablet by mouth daily.   promethazine 25 MG tablet Commonly known as:   PHENERGAN Take 1 tablet (25 mg total) by mouth every 6 (six) hours as needed for nausea or vomiting.   ranitidine 150 MG tablet Commonly known as:  ZANTAC  Take 150 mg by mouth 2 (two) times daily as needed for heartburn.       Sharen Counter Certified Nurse-Midwife 08/22/2017 4:06 AM

## 2017-08-22 NOTE — MAU Note (Signed)
Urine in lab 

## 2017-08-22 NOTE — MAU Note (Signed)
Pt here with c/o decreased fetal movement for the last couple of hours and back pain for the last couple of days. Denies any bleeding but has had some leaking, not sure if it's urine or otherwise.

## 2017-08-22 NOTE — Progress Notes (Signed)
Consultation  26 year old, G3 P0110, currently at 34 weeks 0 days gestation (EDD 10/03/2017), seen in consultation today for delivery date planning. She has a history of IUFD at 35 weeks. She notes a NST the day prior which was reported as fine and came in the next day with no FCA for the baby. She delivered a 5 pound 2 ounce female infant that appeared normal per her report. She reports that pathology report suggested an intrauterine infection.   She also has a history of lupus on plaquenil and prednisone (with pos SSA and SSB and is no longer being followed by pediatric cardiology) and [facility] without medications.  She notes good fetal movement, and denies contractions, vaginal bleeding or ROM.  Past Medical History:  Diagnosis Date  . Arthritis    "hands and legs" (01/08/2015)  . CAP (community acquired pneumonia) 01/07/2015  . Daily headache    "sometimes" (01/08/2015)  . GERD (gastroesophageal reflux disease)   . Hypertension   . Lung disease   . Lupus   . Pulmonary hypertension (HCC)   . Sjogren's syndrome [facility])    Past Surgical History:  Procedure Laterality Date  . CARDIAC CATHETERIZATION N/A 09/09/2015   Procedure: Right Heart Cath;  Surgeon: Laurey Morale, MD;  Location: [facility] INVASIVE CV LAB;  Service: Cardiovascular;  Laterality: N/A;  . DILATION AND EVACUATION N/A 07/13/2015   Procedure: DILATATION AND EVACUATION;  Surgeon: Levie Heritage, DO;  Location: [facility] ORS;  Service: Gynecology;  Laterality: N/A;  . FINGER SURGERY Right 03/2014   "laceration, nerve/artery injury" 2nd digit  . VIDEO BRONCHOSCOPY Bilateral 01/12/2015   Procedure: VIDEO BRONCHOSCOPY WITH FLUORO;  Surgeon: Leslye Peer, MD;  Location: [facility] ENDOSCOPY;  Service: Cardiopulmonary;  Laterality: Bilateral;   Obstetric History   G3   P1   T0   P1   A1   L0    SAB1   TAB0   Ectopic0   Multiple0   Live Births0     # Outcome Date GA Lbr Len/2nd Weight Sex Delivery Anes PTL Lv  3 Current           2 Preterm 06/2015  [redacted]w[redacted]d   F    FD  1 SAB  [redacted]w[redacted]d    SAB        Family History  Problem Relation Age of Onset  . Diabetes Mother   . Diabetes Maternal Aunt   . Diabetes Maternal Grandmother    Allergies  Allergen Reactions  . Hydrocodone Nausea And Vomiting  . Zithromax [Azithromycin] Itching and Cough   Social History   Social History  . Marital status: Single    Spouse name: N/A  . Number of children: N/A  . Years of education: N/A   Occupational History  . [employer]      Workers Compensation   Social History Main Topics  . Smoking status: Former Smoker    Packs/day: 0.10    Years: 5.00    Types: Cigarettes    Quit date: 11/20/2014  . Smokeless tobacco: Never Used  . Alcohol use No  . Drug use: No  . Sexual activity: Yes    Birth control/ protection: None     Comment: intercours on 03/07/16   Other Topics Concern  . Not on file   Social History Narrative  . No narrative on file     #1 CHTN - Normotensive without medication today - No signs/symptoms of preeclampsia today - Normal appearing fetal growth on 9/202018 ultrasound (  69th centile for stated gestational age) - BPP 8/8 with an AFI > 12 today - Continue serial antenatal testing and ultrasounds for fetal growth #2 Lupus with pulmonary sequelae - See above section - Had been followed by pediatric cardiology given SSA and SSB positive. - Would recommend twice weekly testing given current issues - Would recommend delivery at [redacted] weeks gestation or as clinically indicated #3 History of IUFD - Baby normally grown by reports without defects - Sounds like less likely recurring situation with infectious etiology.  Questions appear answered to her satisfaction. Precautions for the above given. Spent greater than 1/2 of 35 minute visit face to face counseling

## 2017-08-22 NOTE — MAU Note (Signed)
PT  SAYS  LAST TIME  FELT  BABY MOVED  WAS 1030- LESS MOVEMENT -   PNC - WITH  CLINIC.      SAYS BACK PAIN  STARTED  SAT NIGHT- USES  HEATING PAD .    SINCE IN MAU   FEELS NAUSEA -  VOMITED    APPROX 30 CC  AFTER  COUGH-   SOUNDS  CONGESTED .

## 2017-08-23 ENCOUNTER — Ambulatory Visit: Payer: Self-pay

## 2017-08-23 ENCOUNTER — Ambulatory Visit (INDEPENDENT_AMBULATORY_CARE_PROVIDER_SITE_OTHER): Payer: Medicare Other | Admitting: Family Medicine

## 2017-08-23 ENCOUNTER — Ambulatory Visit (INDEPENDENT_AMBULATORY_CARE_PROVIDER_SITE_OTHER): Payer: Medicare Other | Admitting: *Deleted

## 2017-08-23 VITALS — BP 115/76 | HR 107 | Wt 182.3 lb

## 2017-08-23 DIAGNOSIS — O9989 Other specified diseases and conditions complicating pregnancy, childbirth and the puerperium: Secondary | ICD-10-CM

## 2017-08-23 DIAGNOSIS — Z8759 Personal history of other complications of pregnancy, childbirth and the puerperium: Secondary | ICD-10-CM

## 2017-08-23 DIAGNOSIS — O99891 Other specified diseases and conditions complicating pregnancy: Secondary | ICD-10-CM

## 2017-08-23 DIAGNOSIS — O0990 Supervision of high risk pregnancy, unspecified, unspecified trimester: Secondary | ICD-10-CM

## 2017-08-23 DIAGNOSIS — O10919 Unspecified pre-existing hypertension complicating pregnancy, unspecified trimester: Secondary | ICD-10-CM

## 2017-08-23 DIAGNOSIS — O10913 Unspecified pre-existing hypertension complicating pregnancy, third trimester: Secondary | ICD-10-CM

## 2017-08-23 DIAGNOSIS — M329 Systemic lupus erythematosus, unspecified: Secondary | ICD-10-CM

## 2017-08-23 DIAGNOSIS — O0993 Supervision of high risk pregnancy, unspecified, third trimester: Secondary | ICD-10-CM

## 2017-08-23 LAB — POCT URINALYSIS DIP (DEVICE)
Bilirubin Urine: NEGATIVE
GLUCOSE, UA: NEGATIVE mg/dL
Ketones, ur: NEGATIVE mg/dL
NITRITE: NEGATIVE
PROTEIN: NEGATIVE mg/dL
SPECIFIC GRAVITY, URINE: 1.02 (ref 1.005–1.030)
UROBILINOGEN UA: 0.2 mg/dL (ref 0.0–1.0)
pH: 7 (ref 5.0–8.0)

## 2017-08-23 MED ORDER — AZATHIOPRINE 50 MG PO TABS: 50.0000 mg | ORAL_TABLET | Freq: Every day | ORAL | 1 refills | Status: DC

## 2017-08-23 NOTE — Progress Notes (Signed)
Pt informed that the ultrasound is considered a limited OB ultrasound and is not intended to be a complete ultrasound exam.  Patient also informed that the ultrasound is not being completed with the intent of assessing for fetal or placental anomalies or any pelvic abnormalities.  Explained that the purpose of today's ultrasound is to assess for presentation, BPP and amniotic fluid volume.  Patient acknowledges the purpose of the exam and the limitations of the study.    BPP initiated with fetal breathing and tone demonstrated immediately.  The study was not completed due to pt had BPP of 8/8 on 10/2 @ MFM. Dr. Shawnie Pons aware.

## 2017-08-23 NOTE — Progress Notes (Signed)
   PRENATAL VISIT NOTE  Subjective:  Kayla [name] is a 26 y.o. G3P0110 at [redacted]w[redacted]d being seen today for ongoing prenatal care.  She is currently monitored for the following issues for this high-risk pregnancy and has Interstitial lung disease (HCC); Supervision of high risk pregnancy, antepartum; Systemic lupus erythematosus (SLE) affecting pregnancy, antepartum (HCC); Lupus (systemic lupus erythematosus) (HCC); Loud P2 (pulmonary S2, second heart sound); Insomnia; Other secondary pulmonary hypertension (HCC); Chronic hypertension; Chronic hypertension during pregnancy, antepartum; Sjogren's syndrome (HCC); Chronic Respiratory failure with oxygen requirement affecting pregnancy, antepartum; SS-A antibody positive; SS-B antibody positive; History of IUFD; and Gonorrhea affecting pregnancy on her problem list.  Patient reports multiple, including, worsening breathing, using O2 at night with cough, and joint aches and low back pain. Feels decreased FM--seen with same in MAU yesterday and see by MFM at same time..  Contractions: Not present. Vag. Bleeding: None.  Movement: (!) Decreased. Denies leaking of fluid.   The following portions of the patient's history were reviewed and updated as appropriate: allergies, current medications, past family history, past medical history, past social history, past surgical history and problem list. Problem list updated.  Objective:   Vitals:   08/23/17 1126  BP: 115/76  Pulse: (!) 107  Weight: 182 lb 4.8 oz (82.7 kg)    Fetal Status: Fetal Heart Rate (bpm): NST   Movement: (!) Decreased     General:  Alert, oriented and cooperative. Patient is in no acute distress.  Skin: Skin is warm and dry. No rash noted.   Cardiovascular: Normal heart rate noted  Respiratory: Normal respiratory effort, no problems with respiration noted  Abdomen: Soft, gravid, appropriate for gestational age.  Pain/Pressure: Present     Pelvic: Cervical exam deferred        Extremities:  Normal range of motion.  Edema: Trace  Mental Status:  Normal mood and affect. Normal behavior. Normal judgment and thought content.  BPP 10/10 on yesterday NST:  Baseline: 150 bpm, Variability: Good {> 6 bpm), Accelerations: Reactive and Decelerations: Absent   Assessment and Plan:  Pregnancy: G3P0110 at [redacted]w[redacted]d  1. Supervision of high risk pregnancy, antepartum   2. History of IUFD Continue antepartum testing.  3. Chronic hypertension during pregnancy, antepartum BP controlled on no meds Continue ASA  4. Systemic lupus erythematosus (SLE) affecting pregnancy, antepartum [facility]) Case discussed with Rheumatology Dr. Alene Mires at [facility] and MFM Dr. Sherrie George at [facility] to restart on Imuran--risks of neonatal anemia and thrombocytompenia discussed with patient. Dr. Alene Mires will try to get patient back in with him in next few weeks. Dr. Clarisa Fling saw patient in u/s yesterday and recommended delivery at 37 wks--will schedule IOL  - azaTHIOprine (IMURAN) 50 MG tablet; Take 1 tablet (50 mg total) by mouth daily.  Dispense: 90 tablet; Refill: 1  Preterm labor symptoms and general obstetric precautions including but not limited to vaginal bleeding, contractions, leaking of fluid and fetal movement were reviewed in detail with the patient. Please refer to After Visit Summary for other counseling recommendations.  Return in about 1 week (around 08/30/2017) for weekly as scheduled.   Reva Bores, MD

## 2017-08-23 NOTE — Patient Instructions (Signed)
Breastfeeding Deciding to breastfeed is one of the best choices you can make for you and your baby. A change in hormones during pregnancy causes your breast tissue to grow and increases the number and size of your milk ducts. These hormones also allow proteins, sugars, and fats from your blood supply to make breast milk in your milk-producing glands. Hormones prevent breast milk from being released before your baby is born as well as prompt milk flow after birth. Once breastfeeding has begun, thoughts of your baby, as well as his or her sucking or crying, can stimulate the release of milk from your milk-producing glands. Benefits of breastfeeding For Your Baby  Your first milk (colostrum) helps your baby's digestive system function better.  There are antibodies in your milk that help your baby fight off infections.  Your baby has a lower incidence of asthma, allergies, and sudden infant death syndrome.  The nutrients in breast milk are better for your baby than infant formulas and are designed uniquely for your baby's needs.  Breast milk improves your baby's brain development.  Your baby is less likely to develop other conditions, such as childhood obesity, asthma, or type 2 diabetes mellitus.  For You  Breastfeeding helps to create a very special bond between you and your baby.  Breastfeeding is convenient. Breast milk is always available at the correct temperature and costs nothing.  Breastfeeding helps to burn calories and helps you lose the weight gained during pregnancy.  Breastfeeding makes your uterus contract to its prepregnancy size faster and slows bleeding (lochia) after you give birth.  Breastfeeding helps to lower your risk of developing type 2 diabetes mellitus, osteoporosis, and breast or ovarian cancer later in life.  Signs that your baby is hungry Early Signs of Hunger  Increased alertness or activity.  Stretching.  Movement of the head from side to  side.  Movement of the head and opening of the mouth when the corner of the mouth or cheek is stroked (rooting).  Increased sucking sounds, smacking lips, cooing, sighing, or squeaking.  Hand-to-mouth movements.  Increased sucking of fingers or hands.  Late Signs of Hunger  Fussing.  Intermittent crying.  Extreme Signs of Hunger Signs of extreme hunger will require calming and consoling before your baby will be able to breastfeed successfully. Do not wait for the following signs of extreme hunger to occur before you initiate breastfeeding:  Restlessness.  A loud, strong cry.  Screaming.  Breastfeeding basics Breastfeeding Initiation  Find a comfortable place to sit or lie down, with your neck and back well supported.  Place a pillow or rolled up blanket under your baby to bring him or her to the level of your breast (if you are seated). Nursing pillows are specially designed to help support your arms and your baby while you breastfeed.  Make sure that your baby's abdomen is facing your abdomen.  Gently massage your breast. With your fingertips, massage from your chest wall toward your nipple in a circular motion. This encourages milk flow. You may need to continue this action during the feeding if your milk flows slowly.  Support your breast with 4 fingers underneath and your thumb above your nipple. Make sure your fingers are well away from your nipple and your baby's mouth.  Stroke your baby's lips gently with your finger or nipple.  When your baby's mouth is open wide enough, quickly bring your baby to your breast, placing your entire nipple and as much of the colored area   around your nipple (areola) as possible into your baby's mouth. ? More areola should be visible above your baby's upper lip than below the lower lip. ? Your baby's tongue should be between his or her lower gum and your breast.  Ensure that your baby's mouth is correctly positioned around your nipple  (latched). Your baby's lips should create a seal on your breast and be turned out (everted).  It is common for your baby to suck about 2-3 minutes in order to start the flow of breast milk.  Latching Teaching your baby how to latch on to your breast properly is very important. An improper latch can cause nipple pain and decreased milk supply for you and poor weight gain in your baby. Also, if your baby is not latched onto your nipple properly, he or she may swallow some air during feeding. This can make your baby fussy. Burping your baby when you switch breasts during the feeding can help to get rid of the air. However, teaching your baby to latch on properly is still the best way to prevent fussiness from swallowing air while breastfeeding. Signs that your baby has successfully latched on to your nipple:  Silent tugging or silent sucking, without causing you pain.  Swallowing heard between every 3-4 sucks.  Muscle movement above and in front of his or her ears while sucking.  Signs that your baby has not successfully latched on to nipple:  Sucking sounds or smacking sounds from your baby while breastfeeding.  Nipple pain.  If you think your baby has not latched on correctly, slip your finger into the corner of your baby's mouth to break the suction and place it between your baby's gums. Attempt breastfeeding initiation again. Signs of Successful Breastfeeding Signs from your baby:  A gradual decrease in the number of sucks or complete cessation of sucking.  Falling asleep.  Relaxation of his or her body.  Retention of a small amount of milk in his or her mouth.  Letting go of your breast by himself or herself.  Signs from you:  Breasts that have increased in firmness, weight, and size 1-3 hours after feeding.  Breasts that are softer immediately after breastfeeding.  Increased milk volume, as well as a change in milk consistency and color by the fifth day of  breastfeeding.  Nipples that are not sore, cracked, or bleeding.  Signs That Your Baby is Getting Enough Milk  Wetting at least 1-2 diapers during the first 24 hours after birth.  Wetting at least 5-6 diapers every 24 hours for the first week after birth. The urine should be clear or pale yellow by 5 days after birth.  Wetting 6-8 diapers every 24 hours as your baby continues to grow and develop.  At least 3 stools in a 24-hour period by age 5 days. The stool should be soft and yellow.  At least 3 stools in a 24-hour period by age 7 days. The stool should be seedy and yellow.  No loss of weight greater than 10% of birth weight during the first 3 days of age.  Average weight gain of 4-7 ounces (113-198 g) per week after age 4 days.  Consistent daily weight gain by age 5 days, without weight loss after the age of 2 weeks.  After a feeding, your baby may spit up a small amount. This is common. Breastfeeding frequency and duration Frequent feeding will help you make more milk and can prevent sore nipples and breast engorgement. Breastfeed when   you feel the need to reduce the fullness of your breasts or when your baby shows signs of hunger. This is called "breastfeeding on demand." Avoid introducing a pacifier to your baby while you are working to establish breastfeeding (the first 4-6 weeks after your baby is born). After this time you may choose to use a pacifier. Research has shown that pacifier use during the first year of a baby's life decreases the risk of sudden infant death syndrome (SIDS). Allow your baby to feed on each breast as long as he or she wants. Breastfeed until your baby is finished feeding. When your baby unlatches or falls asleep while feeding from the first breast, offer the second breast. Because newborns are often sleepy in the first few weeks of life, you may need to awaken your baby to get him or her to feed. Breastfeeding times will vary from baby to baby. However,  the following rules can serve as a guide to help you ensure that your baby is properly fed:  Newborns (babies 4 weeks of age or younger) may breastfeed every 1-3 hours.  Newborns should not go longer than 3 hours during the day or 5 hours during the night without breastfeeding.  You should breastfeed your baby a minimum of 8 times in a 24-hour period until you begin to introduce solid foods to your baby at around 6 months of age.  Breast milk pumping Pumping and storing breast milk allows you to ensure that your baby is exclusively fed your breast milk, even at times when you are unable to breastfeed. This is especially important if you are going back to work while you are still breastfeeding or when you are not able to be present during feedings. Your lactation consultant can give you guidelines on how long it is safe to store breast milk. A breast pump is a machine that allows you to pump milk from your breast into a sterile bottle. The pumped breast milk can then be stored in a refrigerator or freezer. Some breast pumps are operated by hand, while others use electricity. Ask your lactation consultant which type will work best for you. Breast pumps can be purchased, but some hospitals and breastfeeding support groups lease breast pumps on a monthly basis. A lactation consultant can teach you how to hand express breast milk, if you prefer not to use a pump. Caring for your breasts while you breastfeed Nipples can become dry, cracked, and sore while breastfeeding. The following recommendations can help keep your breasts moisturized and healthy:  Avoid using soap on your nipples.  Wear a supportive bra. Although not required, special nursing bras and tank tops are designed to allow access to your breasts for breastfeeding without taking off your entire bra or top. Avoid wearing underwire-style bras or extremely tight bras.  Air dry your nipples for 3-4minutes after each feeding.  Use only cotton  bra pads to absorb leaked breast milk. Leaking of breast milk between feedings is normal.  Use lanolin on your nipples after breastfeeding. Lanolin helps to maintain your skin's normal moisture barrier. If you use pure lanolin, you do not need to wash it off before feeding your baby again. Pure lanolin is not toxic to your baby. You may also hand express a few drops of breast milk and gently massage that milk into your nipples and allow the milk to air dry.  In the first few weeks after giving birth, some women experience extremely full breasts (engorgement). Engorgement can make your   breasts feel heavy, warm, and tender to the touch. Engorgement peaks within 3-5 days after you give birth. The following recommendations can help ease engorgement:  Completely empty your breasts while breastfeeding or pumping. You may want to start by applying warm, moist heat (in the shower or with warm water-soaked hand towels) just before feeding or pumping. This increases circulation and helps the milk flow. If your baby does not completely empty your breasts while breastfeeding, pump any extra milk after he or she is finished.  Wear a snug bra (nursing or regular) or tank top for 1-2 days to signal your body to slightly decrease milk production.  Apply ice packs to your breasts, unless this is too uncomfortable for you.  Make sure that your baby is latched on and positioned properly while breastfeeding.  If engorgement persists after 48 hours of following these recommendations, contact your health care provider or a lactation consultant. Overall health care recommendations while breastfeeding  Eat healthy foods. Alternate between meals and snacks, eating 3 of each per day. Because what you eat affects your breast milk, some of the foods may make your baby more irritable than usual. Avoid eating these foods if you are sure that they are negatively affecting your baby.  Drink milk, fruit juice, and water to  satisfy your thirst (about 10 glasses a day).  Rest often, relax, and continue to take your prenatal vitamins to prevent fatigue, stress, and anemia.  Continue breast self-awareness checks.  Avoid chewing and smoking tobacco. Chemicals from cigarettes that pass into breast milk and exposure to secondhand smoke may harm your baby.  Avoid alcohol and drug use, including marijuana. Some medicines that may be harmful to your baby can pass through breast milk. It is important to ask your health care provider before taking any medicine, including all over-the-counter and prescription medicine as well as vitamin and herbal supplements. It is possible to become pregnant while breastfeeding. If birth control is desired, ask your health care provider about options that will be safe for your baby. Contact a health care provider if:  You feel like you want to stop breastfeeding or have become frustrated with breastfeeding.  You have painful breasts or nipples.  Your nipples are cracked or bleeding.  Your breasts are red, tender, or warm.  You have a swollen area on either breast.  You have a fever or chills.  You have nausea or vomiting.  You have drainage other than breast milk from your nipples.  Your breasts do not become full before feedings by the fifth day after you give birth.  You feel sad and depressed.  Your baby is too sleepy to eat well.  Your baby is having trouble sleeping.  Your baby is wetting less than 3 diapers in a 24-hour period.  Your baby has less than 3 stools in a 24-hour period.  Your baby's skin or the white part of his or her eyes becomes yellow.  Your baby is not gaining weight by 5 days of age. Get help right away if:  Your baby is overly tired (lethargic) and does not want to wake up and feed.  Your baby develops an unexplained fever. This information is not intended to replace advice given to you by your health care provider. Make sure you discuss  any questions you have with your health care provider. Document Released: 11/07/2005 Document Revised: 04/20/2016 Document Reviewed: 05/01/2013 Elsevier Interactive Patient Education  2017 [employer]  

## 2017-08-24 ENCOUNTER — Encounter: Payer: Self-pay | Admitting: Family Medicine

## 2017-08-24 ENCOUNTER — Ambulatory Visit (HOSPITAL_COMMUNITY): Payer: Medicare Other | Attending: Cardiology

## 2017-08-24 ENCOUNTER — Other Ambulatory Visit: Payer: Self-pay

## 2017-08-24 DIAGNOSIS — O9989 Other specified diseases and conditions complicating pregnancy, childbirth and the puerperium: Secondary | ICD-10-CM | POA: Diagnosis not present

## 2017-08-24 DIAGNOSIS — I5030 Unspecified diastolic (congestive) heart failure: Secondary | ICD-10-CM | POA: Insufficient documentation

## 2017-08-24 DIAGNOSIS — Z3A00 Weeks of gestation of pregnancy not specified: Secondary | ICD-10-CM | POA: Insufficient documentation

## 2017-08-24 DIAGNOSIS — M329 Systemic lupus erythematosus, unspecified: Secondary | ICD-10-CM

## 2017-08-26 ENCOUNTER — Encounter (HOSPITAL_COMMUNITY): Payer: Self-pay | Admitting: *Deleted

## 2017-08-26 ENCOUNTER — Inpatient Hospital Stay (HOSPITAL_COMMUNITY)
Admission: AD | Admit: 2017-08-26 | Discharge: 2017-08-26 | Disposition: A | Discharge status: Home / Self Care | Payer: Medicare Other | Source: Ambulatory Visit | Attending: Obstetrics & Gynecology | Admitting: Obstetrics & Gynecology

## 2017-08-26 DIAGNOSIS — O26893 Other specified pregnancy related conditions, third trimester: Secondary | ICD-10-CM | POA: Insufficient documentation

## 2017-08-26 DIAGNOSIS — R109 Unspecified abdominal pain: Secondary | ICD-10-CM

## 2017-08-26 DIAGNOSIS — R1030 Lower abdominal pain, unspecified: Secondary | ICD-10-CM | POA: Diagnosis present

## 2017-08-26 DIAGNOSIS — Z87891 Personal history of nicotine dependence: Secondary | ICD-10-CM | POA: Insufficient documentation

## 2017-08-26 DIAGNOSIS — Z3A34 34 weeks gestation of pregnancy: Secondary | ICD-10-CM | POA: Insufficient documentation

## 2017-08-26 LAB — URINALYSIS, ROUTINE W REFLEX MICROSCOPIC
Bilirubin Urine: NEGATIVE
Glucose, UA: NEGATIVE mg/dL
Hgb urine dipstick: NEGATIVE
Ketones, ur: 80 mg/dL — AB
Nitrite: NEGATIVE
PH: 7 (ref 5.0–8.0)
Protein, ur: NEGATIVE mg/dL
SPECIFIC GRAVITY, URINE: 1.008 (ref 1.005–1.030)

## 2017-08-26 NOTE — Discharge Instructions (Signed)

## 2017-08-26 NOTE — MAU Provider Note (Signed)
History   G3P0110 @ 34.4 wks in with c/o low abd pain that started at 1500. Pain is sharp in nature associated with fetal movement. Denies ROM or vag bleeding.    CSN: [id number]  Arrival date & time 08/26/17  1643   None     Chief Complaint  Patient presents with  . Abdominal Pain    HPI  Past Medical History:  Diagnosis Date  . Arthritis    "hands and legs" (01/08/2015)  . CAP (community acquired pneumonia) 01/07/2015  . Daily headache    "sometimes" (01/08/2015)  . GERD (gastroesophageal reflux disease)   . Hypertension   . Lung disease   . Lupus   . Pulmonary hypertension (HCC)   . Sjogren's syndrome [facility])     Past Surgical History:  Procedure Laterality Date  . CARDIAC CATHETERIZATION N/A 09/09/2015   Procedure: Right Heart Cath;  Surgeon: Laurey Morale, MD;  Location: [facility] INVASIVE CV LAB;  Service: Cardiovascular;  Laterality: N/A;  . DILATION AND EVACUATION N/A 07/13/2015   Procedure: DILATATION AND EVACUATION;  Surgeon: Levie Heritage, DO;  Location: [facility] ORS;  Service: Gynecology;  Laterality: N/A;  . FINGER SURGERY Right 03/2014   "laceration, nerve/artery injury" 2nd digit  . VIDEO BRONCHOSCOPY Bilateral 01/12/2015   Procedure: VIDEO BRONCHOSCOPY WITH FLUORO;  Surgeon: Leslye Peer, MD;  Location: [facility] ENDOSCOPY;  Service: Cardiopulmonary;  Laterality: Bilateral;    Family History  Problem Relation Age of Onset  . Diabetes Mother   . Diabetes Maternal Aunt   . Diabetes Maternal Grandmother     Social History  Substance Use Topics  . Smoking status: Former Smoker    Packs/day: 0.10    Years: 5.00    Types: Cigarettes    Quit date: 11/20/2014  . Smokeless tobacco: Never Used  . Alcohol use No    OB History    Gravida Para Term Preterm AB Living   3 1 0 1 1 0   SAB TAB Ectopic Multiple Live Births   1 0 0 0 0      Review of Systems  Constitutional: Negative.   HENT: Negative.   Eyes: Negative.   Respiratory: Negative.   Cardiovascular:  Negative.   Gastrointestinal: Positive for abdominal pain.  Endocrine: Negative.   Genitourinary: Negative.   Musculoskeletal: Negative.   Skin: Negative.   Allergic/Immunologic: Negative.   Neurological: Negative.   Hematological: Negative.   Psychiatric/Behavioral: Negative.     Allergies  Hydrocodone and Zithromax [azithromycin]  Home Medications    BP 136/83 (BP Location: Right Arm)   Pulse (!) 103   Temp 98.4 F (36.9 C) (Oral)   Resp 18   Ht  (1.6 m)   Wt 182 lb (82.6 kg)   LMP 12/27/2016 (Exact Date)   BMI 32.24 kg/m   Physical Exam  Constitutional: She is oriented to person, place, and time. She appears well-developed and well-nourished.  HENT:  Head: Normocephalic.  Eyes: Pupils are equal, round, and reactive to light.  Neck: Normal range of motion.  Cardiovascular: Normal rate, regular rhythm, normal heart sounds and intact distal pulses.   Pulmonary/Chest: Effort normal and breath sounds normal.  Abdominal: Soft. Bowel sounds are normal.  Genitourinary: Vagina normal and uterus normal.  Musculoskeletal: Normal range of motion.  Neurological: She is alert and oriented to person, place, and time. She has normal reflexes.  Skin: Skin is warm and dry.  Psychiatric: She has a normal mood and affect. Her behavior is  normal. Judgment and thought content normal.    MAU Course  Procedures (including critical care time)  Labs Reviewed  URINALYSIS, ROUTINE W REFLEX MICROSCOPIC - Abnormal; Notable for the following:       Result Value   Ketones, ur 80 (*)    Leukocytes, UA TRACE (*)    Bacteria, UA RARE (*)    Squamous Epithelial / LPF 0-5 (*)    All other components within normal limits   No results found.   1. Abdominal pain in pregnancy, third trimester       MDM  VSS, FHR pattern reassuring,  accels noted no decels. SVE ft/th/post/high. Will df/c home with reassuring strip.

## 2017-08-26 NOTE — MAU Note (Addendum)
Pt. Here due to increased abd. Pain that started around 1500 today, 8/10. SVE at doctors office this past Thursday was 1.5 cm -2. Last sexual intercourse was "a couple of months".  Pt. Denies sudden gush of fluid, no vaginal bleeding, positive for fetal movement. EFM applied - FHR -140s, Toco applied - abd. Soft.

## 2017-08-31 ENCOUNTER — Ambulatory Visit (INDEPENDENT_AMBULATORY_CARE_PROVIDER_SITE_OTHER): Payer: Medicare Other | Admitting: Obstetrics & Gynecology

## 2017-08-31 ENCOUNTER — Ambulatory Visit: Payer: Self-pay

## 2017-08-31 ENCOUNTER — Ambulatory Visit (INDEPENDENT_AMBULATORY_CARE_PROVIDER_SITE_OTHER): Payer: Medicare Other | Admitting: *Deleted

## 2017-08-31 ENCOUNTER — Telehealth: Payer: Self-pay | Admitting: *Deleted

## 2017-08-31 VITALS — BP 114/72 | HR 109 | Wt 181.4 lb

## 2017-08-31 DIAGNOSIS — O0993 Supervision of high risk pregnancy, unspecified, third trimester: Secondary | ICD-10-CM

## 2017-08-31 DIAGNOSIS — O10919 Unspecified pre-existing hypertension complicating pregnancy, unspecified trimester: Secondary | ICD-10-CM

## 2017-08-31 DIAGNOSIS — O9989 Other specified diseases and conditions complicating pregnancy, childbirth and the puerperium: Secondary | ICD-10-CM

## 2017-08-31 DIAGNOSIS — Z8759 Personal history of other complications of pregnancy, childbirth and the puerperium: Secondary | ICD-10-CM

## 2017-08-31 DIAGNOSIS — M329 Systemic lupus erythematosus, unspecified: Secondary | ICD-10-CM

## 2017-08-31 DIAGNOSIS — O10913 Unspecified pre-existing hypertension complicating pregnancy, third trimester: Secondary | ICD-10-CM | POA: Diagnosis present

## 2017-08-31 DIAGNOSIS — O0990 Supervision of high risk pregnancy, unspecified, unspecified trimester: Secondary | ICD-10-CM

## 2017-08-31 MED ORDER — FLUCONAZOLE 150 MG PO TABS: 150.0000 mg | ORAL_TABLET | Freq: Once | ORAL | 0 refills | Status: AC

## 2017-08-31 MED ORDER — PANTOPRAZOLE SODIUM 40 MG PO TBEC: 40.0000 mg | DELAYED_RELEASE_TABLET | Freq: Every day | ORAL | 0 refills | Status: DC

## 2017-08-31 NOTE — Telephone Encounter (Signed)
Pharmacist called to verify we did want to prescribe protonix and diflucan written today because diflucan is considered category d. Discussed with Dr. Debroah Loop and he wants prescribed as written by him today. Informed pharmacy.

## 2017-08-31 NOTE — Progress Notes (Signed)

## 2017-08-31 NOTE — Progress Notes (Signed)
   PRENATAL VISIT NOTE  Subjective:  Kayla [name] is a 26 y.o. G3P0110 at [redacted]w[redacted]d being seen today for ongoing prenatal care.  She is currently monitored for the following issues for this high-risk pregnancy and has Interstitial lung disease (HCC); Supervision of high risk pregnancy, antepartum; Systemic lupus erythematosus (SLE) affecting pregnancy, antepartum (HCC); Lupus (systemic lupus erythematosus) (HCC); Loud P2 (pulmonary S2, second heart sound); Insomnia; Other secondary pulmonary hypertension (HCC); Chronic hypertension; Chronic hypertension during pregnancy, antepartum; Sjogren's syndrome (HCC); Chronic Respiratory failure with oxygen requirement affecting pregnancy, antepartum; SS-A antibody positive; SS-B antibody positive; History of IUFD; and Gonorrhea affecting pregnancy on her problem list.  Patient reports occasional O2 use, has heartburn and nausea.  Contractions: Irregular. Vag. Bleeding: None.  Movement: (!) Decreased. Denies leaking of fluid.   The following portions of the patient's history were reviewed and updated as appropriate: allergies, current medications, past family history, past medical history, past social history, past surgical history and problem list. Problem list updated.  Objective:   Vitals:   08/31/17 1424  BP: 114/72  Pulse: (!) 109  Weight: 181 lb 6.4 oz (82.3 kg)    Fetal Status: Fetal Heart Rate (bpm): NST   Movement: (!) Decreased     General:  Alert, oriented and cooperative. Patient is in no acute distress.  Skin: Skin is warm and dry. No rash noted.   Cardiovascular: Normal heart rate noted  Respiratory: Normal respiratory effort, no problems with respiration noted  Abdomen: Soft, gravid, appropriate for gestational age.  Pain/Pressure: Present     Pelvic: Cervical exam deferred        Extremities: Normal range of motion.     Mental Status:  Normal mood and affect. Normal behavior. Normal judgment and thought content.   Assessment and  Plan:  Pregnancy: G3P0110 at [redacted]w[redacted]d  1. Supervision of high risk pregnancy, antepartum Reflux and yeast sx - pantoprazole (PROTONIX) 40 MG tablet; Take 1 tablet (40 mg total) by mouth daily.  Dispense: 30 tablet; Refill: 0 - fluconazole (DIFLUCAN) 150 MG tablet; Take 1 tablet (150 mg total) by mouth once.  Dispense: 1 tablet; Refill: 0  2. Chronic hypertension during pregnancy, antepartum Nl BP  3. History of IUFD   4. Systemic lupus erythematosus (SLE) affecting pregnancy, antepartum (HCC) IOL 37 weeks, restart imuran  Preterm labor symptoms and general obstetric precautions including but not limited to vaginal bleeding, contractions, leaking of fluid and fetal movement were reviewed in detail with the patient. Please refer to After Visit Summary for other counseling recommendations.  Return in about 7 days (around 09/07/2017) for as scheduled.   Scheryl Darter, MD

## 2017-08-31 NOTE — Patient Instructions (Signed)
Third Trimester of Pregnancy The third trimester is from week 28 through week 40 (months 7 through 9). The third trimester is a time when the unborn baby (fetus) is growing rapidly. At the end of the ninth month, the fetus is about 20 inches in length and weighs 6-10 pounds. Body changes during your third trimester Your body will continue to go through many changes during pregnancy. The changes vary from woman to woman. During the third trimester:  Your weight will continue to increase. You can expect to gain 25-35 pounds (11-16 kg) by the end of the pregnancy.  You may begin to get stretch marks on your hips, abdomen, and breasts.  You may urinate more often because the fetus is moving lower into your pelvis and pressing on your bladder.  You may develop or continue to have heartburn. This is caused by increased hormones that slow down muscles in the digestive tract.  You may develop or continue to have constipation because increased hormones slow digestion and cause the muscles that push waste through your intestines to relax.  You may develop hemorrhoids. These are swollen veins (varicose veins) in the rectum that can itch or be painful.  You may develop swollen, bulging veins (varicose veins) in your legs.  You may have increased body aches in the pelvis, back, or thighs. This is due to weight gain and increased hormones that are relaxing your joints.  You may have changes in your hair. These can include thickening of your hair, rapid growth, and changes in texture. Some women also have hair loss during or after pregnancy, or hair that feels dry or thin. Your hair will most likely return to normal after your baby is born.  Your breasts will continue to grow and they will continue to become tender. A yellow fluid (colostrum) may leak from your breasts. This is the first milk you are producing for your baby.  Your belly button may stick out.  You may notice more swelling in your hands,  face, or ankles.  You may have increased tingling or numbness in your hands, arms, and legs. The skin on your belly may also feel numb.  You may feel short of breath because of your expanding uterus.  You may have more problems sleeping. This can be caused by the size of your belly, increased need to urinate, and an increase in your body's metabolism.  You may notice the fetus "dropping," or moving lower in your abdomen (lightening).  You may have increased vaginal discharge.  You may notice your joints feel loose and you may have pain around your pelvic bone.  What to expect at prenatal visits You will have prenatal exams every 2 weeks until week 36. Then you will have weekly prenatal exams. During a routine prenatal visit:  You will be weighed to make sure you and the baby are growing normally.  Your blood pressure will be taken.  Your abdomen will be measured to track your baby's growth.  The fetal heartbeat will be listened to.  Any test results from the previous visit will be discussed.  You may have a cervical check near your due date to see if your cervix has softened or thinned (effaced).  You will be tested for Group B streptococcus. This happens between 35 and 37 weeks.  Your health care provider may ask you:  What your birth plan is.  How you are feeling.  If you are feeling the baby move.  If you have had   any abnormal symptoms, such as leaking fluid, bleeding, severe headaches, or abdominal cramping.  If you are using any tobacco products, including cigarettes, chewing tobacco, and electronic cigarettes.  If you have any questions.  Other tests or screenings that may be performed during your third trimester include:  Blood tests that check for low iron levels (anemia).  Fetal testing to check the health, activity level, and growth of the fetus. Testing is done if you have certain medical conditions or if there are problems during the  pregnancy.  Nonstress test (NST). This test checks the health of your baby to make sure there are no signs of problems, such as the baby not getting enough oxygen. During this test, a belt is placed around your belly. The baby is made to move, and its heart rate is monitored during movement.  What is false labor? False labor is a condition in which you feel small, irregular tightenings of the muscles in the womb (contractions) that usually go away with rest, changing position, or drinking water. These are called Braxton Hicks contractions. Contractions may last for hours, days, or even weeks before true labor sets in. If contractions come at regular intervals, become more frequent, increase in intensity, or become painful, you should see your health care provider. What are the signs of labor?  Abdominal cramps.  Regular contractions that start at 10 minutes apart and become stronger and more frequent with time.  Contractions that start on the top of the uterus and spread down to the lower abdomen and back.  Increased pelvic pressure and dull back pain.  A watery or bloody mucus discharge that comes from the vagina.  Leaking of amniotic fluid. This is also known as your "water breaking." It could be a slow trickle or a gush. Let your health care provider know if it has a color or strange odor. If you have any of these signs, call your health care provider right away, even if it is before your due date. Follow these instructions at home: Medicines  Follow your health care provider's instructions regarding medicine use. Specific medicines may be either safe or unsafe to take during pregnancy.  Take a prenatal vitamin that contains at least 600 micrograms (mcg) of folic acid.  If you develop constipation, try taking a stool softener if your health care provider approves. Eating and drinking  Eat a balanced diet that includes fresh fruits and vegetables, whole grains, good sources of protein  such as meat, eggs, or tofu, and low-fat dairy. Your health care provider will help you determine the amount of weight gain that is right for you.  Avoid raw meat and uncooked cheese. These carry germs that can cause birth defects in the baby.  If you have low calcium intake from food, talk to your health care provider about whether you should take a daily calcium supplement.  Eat four or five small meals rather than three large meals a day.  Limit foods that are high in fat and processed sugars, such as fried and sweet foods.  To prevent constipation: ? Drink enough fluid to keep your urine clear or pale yellow. ? Eat foods that are high in fiber, such as fresh fruits and vegetables, whole grains, and beans. Activity  Exercise only as directed by your health care provider. Most women can continue their usual exercise routine during pregnancy. Try to exercise for 30 minutes at least 5 days a week. Stop exercising if you experience uterine contractions.  Avoid heavy   lifting.  Do not exercise in extreme heat or humidity, or at high altitudes.  Wear low-heel, comfortable shoes.  Practice good posture.  You may continue to have sex unless your health care provider tells you otherwise. Relieving pain and discomfort  Take frequent breaks and rest with your legs elevated if you have leg cramps or low back pain.  Take warm sitz baths to soothe any pain or discomfort caused by hemorrhoids. Use hemorrhoid cream if your health care provider approves.  Wear a good support bra to prevent discomfort from breast tenderness.  If you develop varicose veins: ? Wear support pantyhose or compression stockings as told by your healthcare provider. ? Elevate your feet for 15 minutes, 3-4 times a day. Prenatal care  Write down your questions. Take them to your prenatal visits.  Keep all your prenatal visits as told by your health care provider. This is important. Safety  Wear your seat belt at  all times when driving.  Make a list of emergency phone numbers, including numbers for family, friends, the hospital, and police and fire departments. General instructions  Avoid cat litter boxes and soil used by cats. These carry germs that can cause birth defects in the baby. If you have a cat, ask someone to clean the litter box for you.  Do not travel far distances unless it is absolutely necessary and only with the approval of your health care provider.  Do not use hot tubs, steam rooms, or saunas.  Do not drink alcohol.  Do not use any products that contain nicotine or tobacco, such as cigarettes and e-cigarettes. If you need help quitting, ask your health care provider.  Do not use any medicinal herbs or unprescribed drugs. These chemicals affect the formation and growth of the baby.  Do not douche or use tampons or scented sanitary pads.  Do not cross your legs for long periods of time.  To prepare for the arrival of your baby: ? Take prenatal classes to understand, practice, and ask questions about labor and delivery. ? Make a trial run to the hospital. ? Visit the hospital and tour the maternity area. ? Arrange for maternity or paternity leave through employers. ? Arrange for family and friends to take care of pets while you are in the hospital. ? Purchase a rear-facing car seat and make sure you know how to install it in your car. ? Pack your hospital bag. ? Prepare the baby's nursery. Make sure to remove all pillows and stuffed animals from the baby's crib to prevent suffocation.  Visit your dentist if you have not gone during your pregnancy. Use a soft toothbrush to brush your teeth and be gentle when you floss. Contact a health care provider if:  You are unsure if you are in labor or if your water has broken.  You become dizzy.  You have mild pelvic cramps, pelvic pressure, or nagging pain in your abdominal area.  You have lower back pain.  You have persistent  nausea, vomiting, or diarrhea.  You have an unusual or bad smelling vaginal discharge.  You have pain when you urinate. Get help right away if:  Your water breaks before 37 weeks.  You have regular contractions less than 5 minutes apart before 37 weeks.  You have a fever.  You are leaking fluid from your vagina.  You have spotting or bleeding from your vagina.  You have severe abdominal pain or cramping.  You have rapid weight loss or weight gain.    You have shortness of breath with chest pain.  You notice sudden or extreme swelling of your face, hands, ankles, feet, or legs.  Your baby makes fewer than 10 movements in 2 hours.  You have severe headaches that do not go away when you take medicine.  You have vision changes. Summary  The third trimester is from week 28 through week 40, months 7 through 9. The third trimester is a time when the unborn baby (fetus) is growing rapidly.  During the third trimester, your discomfort may increase as you and your baby continue to gain weight. You may have abdominal, leg, and back pain, sleeping problems, and an increased need to urinate.  During the third trimester your breasts will keep growing and they will continue to become tender. A yellow fluid (colostrum) may leak from your breasts. This is the first milk you are producing for your baby.  False labor is a condition in which you feel small, irregular tightenings of the muscles in the womb (contractions) that eventually go away. These are called Braxton Hicks contractions. Contractions may last for hours, days, or even weeks before true labor sets in.  Signs of labor can include: abdominal cramps; regular contractions that start at 10 minutes apart and become stronger and more frequent with time; watery or bloody mucus discharge that comes from the vagina; increased pelvic pressure and dull back pain; and leaking of amniotic fluid. This information is not intended to replace advice  given to you by your health care provider. Make sure you discuss any questions you have with your health care provider. Document Released: 11/01/2001 Document Revised: 04/14/2016 Document Reviewed: 01/08/2013 Elsevier Interactive Patient Education  2017 Elsevier Inc.  

## 2017-08-31 NOTE — Progress Notes (Signed)
Pt states she has been using O2 intermittently for the past 3 days.  She feels like she is getting ready to have a Lupus flare up.  Pt scored 14 on PHQ-9 scale. She was offered appt w/[facility] and declined.

## 2017-09-01 ENCOUNTER — Inpatient Hospital Stay (HOSPITAL_COMMUNITY)
Admission: AD | Admit: 2017-09-01 | Discharge: 2017-09-01 | Disposition: A | Discharge status: Home / Self Care | Payer: Medicare Other | Source: Ambulatory Visit | Attending: Obstetrics and Gynecology | Admitting: Obstetrics and Gynecology

## 2017-09-01 ENCOUNTER — Encounter (HOSPITAL_COMMUNITY): Payer: Self-pay | Admitting: *Deleted

## 2017-09-01 ENCOUNTER — Inpatient Hospital Stay (HOSPITAL_COMMUNITY): Payer: Medicare Other

## 2017-09-01 DIAGNOSIS — Z3A35 35 weeks gestation of pregnancy: Secondary | ICD-10-CM | POA: Insufficient documentation

## 2017-09-01 DIAGNOSIS — O163 Unspecified maternal hypertension, third trimester: Secondary | ICD-10-CM | POA: Diagnosis not present

## 2017-09-01 DIAGNOSIS — Z9889 Other specified postprocedural states: Secondary | ICD-10-CM | POA: Diagnosis not present

## 2017-09-01 DIAGNOSIS — Z87891 Personal history of nicotine dependence: Secondary | ICD-10-CM | POA: Insufficient documentation

## 2017-09-01 DIAGNOSIS — M3500 Sicca syndrome, unspecified: Secondary | ICD-10-CM | POA: Diagnosis not present

## 2017-09-01 DIAGNOSIS — N898 Other specified noninflammatory disorders of vagina: Secondary | ICD-10-CM | POA: Diagnosis not present

## 2017-09-01 DIAGNOSIS — O9989 Other specified diseases and conditions complicating pregnancy, childbirth and the puerperium: Secondary | ICD-10-CM | POA: Diagnosis not present

## 2017-09-01 DIAGNOSIS — Z833 Family history of diabetes mellitus: Secondary | ICD-10-CM | POA: Insufficient documentation

## 2017-09-01 DIAGNOSIS — Z7982 Long term (current) use of aspirin: Secondary | ICD-10-CM | POA: Insufficient documentation

## 2017-09-01 DIAGNOSIS — Z885 Allergy status to narcotic agent status: Secondary | ICD-10-CM | POA: Diagnosis not present

## 2017-09-01 DIAGNOSIS — Z79899 Other long term (current) drug therapy: Secondary | ICD-10-CM | POA: Diagnosis not present

## 2017-09-01 DIAGNOSIS — O09293 Supervision of pregnancy with other poor reproductive or obstetric history, third trimester: Secondary | ICD-10-CM | POA: Diagnosis present

## 2017-09-01 DIAGNOSIS — O99613 Diseases of the digestive system complicating pregnancy, third trimester: Secondary | ICD-10-CM | POA: Insufficient documentation

## 2017-09-01 DIAGNOSIS — M329 Systemic lupus erythematosus, unspecified: Secondary | ICD-10-CM | POA: Diagnosis not present

## 2017-09-01 DIAGNOSIS — O289 Unspecified abnormal findings on antenatal screening of mother: Secondary | ICD-10-CM | POA: Insufficient documentation

## 2017-09-01 DIAGNOSIS — Z888 Allergy status to other drugs, medicaments and biological substances status: Secondary | ICD-10-CM | POA: Diagnosis not present

## 2017-09-01 DIAGNOSIS — K219 Gastro-esophageal reflux disease without esophagitis: Secondary | ICD-10-CM | POA: Insufficient documentation

## 2017-09-01 DIAGNOSIS — O26893 Other specified pregnancy related conditions, third trimester: Secondary | ICD-10-CM

## 2017-09-01 DIAGNOSIS — O288 Other abnormal findings on antenatal screening of mother: Secondary | ICD-10-CM

## 2017-09-01 LAB — URINALYSIS, ROUTINE W REFLEX MICROSCOPIC
Bilirubin Urine: NEGATIVE
Glucose, UA: NEGATIVE mg/dL
Hgb urine dipstick: NEGATIVE
Ketones, ur: 20 mg/dL — AB
Nitrite: NEGATIVE
PH: 6 (ref 5.0–8.0)
Protein, ur: 30 mg/dL — AB
SPECIFIC GRAVITY, URINE: 1.015 (ref 1.005–1.030)

## 2017-09-01 LAB — WET PREP, GENITAL
CLUE CELLS WET PREP: NONE SEEN
SPERM: NONE SEEN
TRICH WET PREP: NONE SEEN

## 2017-09-01 LAB — OB RESULTS CONSOLE GC/CHLAMYDIA: Gonorrhea: NEGATIVE

## 2017-09-01 NOTE — MAU Provider Note (Signed)
History     CSN: [id number]  Arrival date and time: 09/01/17 0945   First Provider Initiated Contact with Patient 09/01/17 1033      Chief Complaint  Patient presents with  . Vaginal Discharge   Vaginal Discharge  The patient's primary symptoms include pelvic pain and vaginal discharge. This is a new problem. The current episode started yesterday. The problem occurs constantly. The problem has been unchanged. Pain severity now: 5/10. The problem affects the right side. She is pregnant. Pertinent negatives include no chills, dysuria, fever, frequency, nausea, urgency or vomiting. The vaginal discharge was brown. There has been no bleeding. She has not been passing clots. She has not been passing tissue. Nothing aggravates the symptoms. She has tried nothing for the symptoms. Sexual activity: No intercourse in the last 48 hours.    Past Medical History:  Diagnosis Date  . Arthritis    "hands and legs" (01/08/2015)  . CAP (community acquired pneumonia) 01/07/2015  . Daily headache    "sometimes" (01/08/2015)  . GERD (gastroesophageal reflux disease)   . Hypertension   . Lung disease   . Lupus   . Pulmonary hypertension (HCC)   . Sjogren's syndrome [facility])     Past Surgical History:  Procedure Laterality Date  . CARDIAC CATHETERIZATION N/A 09/09/2015   Procedure: Right Heart Cath;  Surgeon: Laurey Morale, MD;  Location: [facility] INVAS[facility] CV LAB;  Service: Cardiovascular;  Laterality: N/A;  . DILATION AND EVACUATION N/A 07/13/2015   Procedure: DILATATION AND EVACUATION;  Surgeon: Levie Heritage, DO;  Location: [facility] ORS;  Service: Gynecology;  Laterality: N/A;  . FINGER SURGERY Right 03/2014   "laceration, nerve/artery injury" 2nd digit  . VIDEO BRONCHOSCOPY Bilateral 01/12/2015   Procedure: VIDEO BRONCHOSCOPY WITH FLUORO;  Surgeon: Leslye Peer, MD;  Location: [facility] ENDOSCOPY;  Service: Cardiopulmonary;  Laterality: Bilateral;    Family History  Problem Relation Age of Onset  . Diabetes  Mother   . Diabetes Maternal Aunt   . Diabetes Maternal Grandmother     Social History  Substance Use Topics  . Smoking status: Former Smoker    Packs/day: 0.10    Years: 5.00    Types: Cigarettes    Quit date: 11/20/2014  . Smokeless tobacco: Never Used  . Alcohol use No    Allergies:  Allergies  Allergen Reactions  . Hydrocodone Nausea And Vomiting  . Zithromax [Azithromycin] Itching and Cough    Prescriptions Prior to Admission  Medication Sig Dispense Refill Last Dose  . albuterol (PROVENTIL HFA;VENTOLIN HFA) 108 (90 Base) MCG/ACT inhaler Inhale 2 puffs into the lungs every 4 (four) hours as needed for wheezing or shortness of breath. 1 Inhaler 5 Taking  . aspirin EC 81 MG tablet Take 1 tablet (81 mg total) by mouth daily. 30 tablet 2 Taking  . azaTHIOprine (IMURAN) 50 MG tablet Take 1 tablet (50 mg total) by mouth daily. (Patient not taking: Reported on 08/31/2017) 90 tablet 1 Not Taking  . cyclobenzaprine (FLEXERIL) 10 MG tablet Take 0.5-1 tablets (5-10 mg total) by mouth 3 (three) times daily as needed for muscle spasms. (Patient not taking: Reported on 08/31/2017) 20 tablet 0 Not Taking  . fluticasone (FLONASE) 50 MCG/ACT nasal spray Place 2 sprays into both nostrils daily. (Patient taking differently: Place 2 sprays into both nostrils daily as needed for rhinitis. ) 16 g 2 Taking  . folic acid (FOLVITE) 1 MG tablet Take 1 tablet (1 mg total) by mouth daily. 100 tablet 5 Taking  .  hydroxychloroquine (PLAQUENIL) 200 MG tablet Take 200 mg by mouth 2 (two) times daily.   10 Taking  . loperamide (IMODIUM A-D) 2 MG tablet Take 1 tablet (2 mg total) by mouth 4 (four) times daily as needed for diarrhea or loose stools. (Patient not taking: Reported on 08/23/2017) 30 tablet 0 Not Taking  . pantoprazole (PROTONIX) 40 MG tablet Take 1 tablet (40 mg total) by mouth daily. 30 tablet 0   . predniSONE (DELTASONE) 5 MG tablet Take 10 mg by mouth daily with breakfast.    Taking  . Prenatal  Vit-Fe Fumarate-FA (PRENATAL COMPLETE) 14-0.4 MG TABS Take 1 tablet by mouth daily. 30 each 0 Taking  . promethazine (PHENERGAN) 25 MG tablet Take 1 tablet (25 mg total) by mouth every 6 (six) hours as needed for nausea or vomiting. (Patient not taking: Reported on 08/31/2017) 30 tablet 0 Not Taking  . ranitidine (ZANTAC) 150 MG tablet Take 150 mg by mouth 2 (two) times daily as needed for heartburn.   Taking    Review of Systems  Constitutional: Negative for chills and fever.  Gastrointestinal: Negative for nausea and vomiting.  Genitourinary: Positive for pelvic pain and vaginal discharge. Negative for dysuria, frequency, urgency and vaginal bleeding.   Physical Exam   Blood pressure 123/79, pulse (!) 112, temperature 98.6 F (37 C), resp. rate 18, last menstrual period 12/27/2016, SpO2 94 %.  Physical Exam  Nursing note and vitals reviewed. Constitutional: She is oriented to person, place, and time. She appears well-developed and well-nourished. No distress.  HENT:  Head: Normocephalic.  Cardiovascular: Normal rate.   Respiratory: Effort normal.  GI: Soft. There is no tenderness. There is no rebound.  Genitourinary:  Genitourinary Comments:  External: no lesion Vagina: small amount of white discharge. No brown discharge seen  Cervix: pink, smooth, 1/thick/ballotable  Uterus: AGA   Neurological: She is alert and oriented to person, place, and time.  Skin: Skin is warm and dry.  Psychiatric: She has a normal mood and affect.   FHT: 150, moderate with 10x10 accels, no decels Toco: some UI, but no UCs  BPP 8/8   Results for orders placed or performed during the hospital encounter of 09/01/17 (from the past 24 hour(s))  Urinalysis, Routine w reflex microscopic     Status: Abnormal   Collection Time: 09/01/17 10:15 AM  Result Value Ref Range   Color, Urine YELLOW YELLOW   APPearance HAZY (A) CLEAR   Specific Gravity, Urine 1.015 1.005 - 1.030   pH 6.0 5.0 - 8.0   Glucose,  UA NEGATIVE NEGATIVE mg/dL   Hgb urine dipstick NEGATIVE NEGATIVE   Bilirubin Urine NEGATIVE NEGATIVE   Ketones, ur 20 (A) NEGATIVE mg/dL   Protein, ur 30 (A) NEGATIVE mg/dL   Nitrite NEGATIVE NEGATIVE   Leukocytes, UA MODERATE (A) NEGATIVE   RBC / HPF 0-5 0 - 5 RBC/hpf   WBC, UA 6-30 0 - 5 WBC/hpf   Bacteria, UA MANY (A) NONE SEEN   Squamous Epithelial / LPF 6-30 (A) NONE SEEN   Mucus PRESENT   Wet prep, genital     Status: Abnormal   Collection Time: 09/01/17 10:41 AM  Result Value Ref Range   Yeast Wet Prep HPF POC (A) NONE SEEN    Specimen diluted due to transport tube containing more than 1 ml of saline, interpret results with caution.   Trich, Wet Prep NONE SEEN NONE SEEN   Clue Cells Wet Prep HPF POC NONE SEEN NONE SEEN  WBC, Wet Prep HPF POC MANY (A) NONE SEEN   Sperm NONE SEEN     MAU Course  Procedures  MDM   Assessment and Plan   1. Vaginal discharge during pregnancy in third trimester   2. NST (non-stress test) nonreactive   3. [redacted] weeks gestation of pregnancy    BPP8/8  DC home Comfort measures reviewed  3rd Trimester precautions  PTL precautions  Fetal kick counts RX: none  Return to MAU as needed FU with OB as planned  Follow-up Information    [facility] Follow [facility].   Specialty:  Obstetrics and Gynecology Contact information: [address] [place] [address] [phone]           Thressa Sheller 09/01/2017, 10:35 AM

## 2017-09-01 NOTE — Discharge Instructions (Signed)
Third Trimester of Pregnancy The third trimester is from week 28 through week 40 (months 7 through 9). The third trimester is a time when the unborn baby (fetus) is growing rapidly. At the end of the ninth month, the fetus is about 20 inches in length and weighs 6-10 pounds. Body changes during your third trimester Your body will continue to go through many changes during pregnancy. The changes vary from woman to woman. During the third trimester:  Your weight will continue to increase. You can expect to gain 25-35 pounds (11-16 kg) by the end of the pregnancy.  You may begin to get stretch marks on your hips, abdomen, and breasts.  You may urinate more often because the fetus is moving lower into your pelvis and pressing on your bladder.  You may develop or continue to have heartburn. This is caused by increased hormones that slow down muscles in the digestive tract.  You may develop or continue to have constipation because increased hormones slow digestion and cause the muscles that push waste through your intestines to relax.  You may develop hemorrhoids. These are swollen veins (varicose veins) in the rectum that can itch or be painful.  You may develop swollen, bulging veins (varicose veins) in your legs.  You may have increased body aches in the pelvis, back, or thighs. This is due to weight gain and increased hormones that are relaxing your joints.  You may have changes in your hair. These can include thickening of your hair, rapid growth, and changes in texture. Some women also have hair loss during or after pregnancy, or hair that feels dry or thin. Your hair will most likely return to normal after your baby is born.  Your breasts will continue to grow and they will continue to become tender. A yellow fluid (colostrum) may leak from your breasts. This is the first milk you are producing for your baby.  Your belly button may stick out.  You may notice more swelling in your hands,  face, or ankles.  You may have increased tingling or numbness in your hands, arms, and legs. The skin on your belly may also feel numb.  You may feel short of breath because of your expanding uterus.  You may have more problems sleeping. This can be caused by the size of your belly, increased need to urinate, and an increase in your body's metabolism.  You may notice the fetus "dropping," or moving lower in your abdomen (lightening).  You may have increased vaginal discharge.  You may notice your joints feel loose and you may have pain around your pelvic bone.  What to expect at prenatal visits You will have prenatal exams every 2 weeks until week 36. Then you will have weekly prenatal exams. During a routine prenatal visit:  You will be weighed to make sure you and the baby are growing normally.  Your blood pressure will be taken.  Your abdomen will be measured to track your baby's growth.  The fetal heartbeat will be listened to.  Any test results from the previous visit will be discussed.  You may have a cervical check near your due date to see if your cervix has softened or thinned (effaced).  You will be tested for Group B streptococcus. This happens between 35 and 37 weeks.  Your health care provider may ask you:  What your birth plan is.  How you are feeling.  If you are feeling the baby move.  If you have had   any abnormal symptoms, such as leaking fluid, bleeding, severe headaches, or abdominal cramping.  If you are using any tobacco products, including cigarettes, chewing tobacco, and electronic cigarettes.  If you have any questions.  Other tests or screenings that may be performed during your third trimester include:  Blood tests that check for low iron levels (anemia).  Fetal testing to check the health, activity level, and growth of the fetus. Testing is done if you have certain medical conditions or if there are problems during the  pregnancy.  Nonstress test (NST). This test checks the health of your baby to make sure there are no signs of problems, such as the baby not getting enough oxygen. During this test, a belt is placed around your belly. The baby is made to move, and its heart rate is monitored during movement.  What is false labor? False labor is a condition in which you feel small, irregular tightenings of the muscles in the womb (contractions) that usually go away with rest, changing position, or drinking water. These are called Braxton Hicks contractions. Contractions may last for hours, days, or even weeks before true labor sets in. If contractions come at regular intervals, become more frequent, increase in intensity, or become painful, you should see your health care provider. What are the signs of labor?  Abdominal cramps.  Regular contractions that start at 10 minutes apart and become stronger and more frequent with time.  Contractions that start on the top of the uterus and spread down to the lower abdomen and back.  Increased pelvic pressure and dull back pain.  A watery or bloody mucus discharge that comes from the vagina.  Leaking of amniotic fluid. This is also known as your "water breaking." It could be a slow trickle or a gush. Let your health care provider know if it has a color or strange odor. If you have any of these signs, call your health care provider right away, even if it is before your due date. Follow these instructions at home: Medicines  Follow your health care provider's instructions regarding medicine use. Specific medicines may be either safe or unsafe to take during pregnancy.  Take a prenatal vitamin that contains at least 600 micrograms (mcg) of folic acid.  If you develop constipation, try taking a stool softener if your health care provider approves. Eating and drinking  Eat a balanced diet that includes fresh fruits and vegetables, whole grains, good sources of protein  such as meat, eggs, or tofu, and low-fat dairy. Your health care provider will help you determine the amount of weight gain that is right for you.  Avoid raw meat and uncooked cheese. These carry germs that can cause birth defects in the baby.  If you have low calcium intake from food, talk to your health care provider about whether you should take a daily calcium supplement.  Eat four or five small meals rather than three large meals a day.  Limit foods that are high in fat and processed sugars, such as fried and sweet foods.  To prevent constipation: ? Drink enough fluid to keep your urine clear or pale yellow. ? Eat foods that are high in fiber, such as fresh fruits and vegetables, whole grains, and beans. Activity  Exercise only as directed by your health care provider. Most women can continue their usual exercise routine during pregnancy. Try to exercise for 30 minutes at least 5 days a week. Stop exercising if you experience uterine contractions.  Avoid heavy   lifting.  Do not exercise in extreme heat or humidity, or at high altitudes.  Wear low-heel, comfortable shoes.  Practice good posture.  You may continue to have sex unless your health care provider tells you otherwise. Relieving pain and discomfort  Take frequent breaks and rest with your legs elevated if you have leg cramps or low back pain.  Take warm sitz baths to soothe any pain or discomfort caused by hemorrhoids. Use hemorrhoid cream if your health care provider approves.  Wear a good support bra to prevent discomfort from breast tenderness.  If you develop varicose veins: ? Wear support pantyhose or compression stockings as told by your healthcare provider. ? Elevate your feet for 15 minutes, 3-4 times a day. Prenatal care  Write down your questions. Take them to your prenatal visits.  Keep all your prenatal visits as told by your health care provider. This is important. Safety  Wear your seat belt at  all times when driving.  Make a list of emergency phone numbers, including numbers for family, friends, the hospital, and police and fire departments. General instructions  Avoid cat litter boxes and soil used by cats. These carry germs that can cause birth defects in the baby. If you have a cat, ask someone to clean the litter box for you.  Do not travel far distances unless it is absolutely necessary and only with the approval of your health care provider.  Do not use hot tubs, steam rooms, or saunas.  Do not drink alcohol.  Do not use any products that contain nicotine or tobacco, such as cigarettes and e-cigarettes. If you need help quitting, ask your health care provider.  Do not use any medicinal herbs or unprescribed drugs. These chemicals affect the formation and growth of the baby.  Do not douche or use tampons or scented sanitary pads.  Do not cross your legs for long periods of time.  To prepare for the arrival of your baby: ? Take prenatal classes to understand, practice, and ask questions about labor and delivery. ? Make a trial run to the hospital. ? Visit the hospital and tour the maternity area. ? Arrange for maternity or paternity leave through employers. ? Arrange for family and friends to take care of pets while you are in the hospital. ? Purchase a rear-facing car seat and make sure you know how to install it in your car. ? Pack your hospital bag. ? Prepare the baby's nursery. Make sure to remove all pillows and stuffed animals from the baby's crib to prevent suffocation.  Visit your dentist if you have not gone during your pregnancy. Use a soft toothbrush to brush your teeth and be gentle when you floss. Contact a health care provider if:  You are unsure if you are in labor or if your water has broken.  You become dizzy.  You have mild pelvic cramps, pelvic pressure, or nagging pain in your abdominal area.  You have lower back pain.  You have persistent  nausea, vomiting, or diarrhea.  You have an unusual or bad smelling vaginal discharge.  You have pain when you urinate. Get help right away if:  Your water breaks before 37 weeks.  You have regular contractions less than 5 minutes apart before 37 weeks.  You have a fever.  You are leaking fluid from your vagina.  You have spotting or bleeding from your vagina.  You have severe abdominal pain or cramping.  You have rapid weight loss or weight gain.    You have shortness of breath with chest pain.  You notice sudden or extreme swelling of your face, hands, ankles, feet, or legs.  Your baby makes fewer than 10 movements in 2 hours.  You have severe headaches that do not go away when you take medicine.  You have vision changes. Summary  The third trimester is from week 28 through week 40, months 7 through 9. The third trimester is a time when the unborn baby (fetus) is growing rapidly.  During the third trimester, your discomfort may increase as you and your baby continue to gain weight. You may have abdominal, leg, and back pain, sleeping problems, and an increased need to urinate.  During the third trimester your breasts will keep growing and they will continue to become tender. A yellow fluid (colostrum) may leak from your breasts. This is the first milk you are producing for your baby.  False labor is a condition in which you feel small, irregular tightenings of the muscles in the womb (contractions) that eventually go away. These are called Braxton Hicks contractions. Contractions may last for hours, days, or even weeks before true labor sets in.  Signs of labor can include: abdominal cramps; regular contractions that start at 10 minutes apart and become stronger and more frequent with time; watery or bloody mucus discharge that comes from the vagina; increased pelvic pressure and dull back pain; and leaking of amniotic fluid. This information is not intended to replace advice  given to you by your health care provider. Make sure you discuss any questions you have with your health care provider. Document Released: 11/01/2001 Document Revised: 04/14/2016 Document Reviewed: 01/08/2013 Elsevier Interactive Patient Education  2017 [employer]  

## 2017-09-01 NOTE — MAU Note (Signed)
Pt reports she went to the BR last night and when she wiped she saw some brown discharge. Still having some today.  Reports a little cramping on right side.

## 2017-09-04 LAB — GC/CHLAMYDIA PROBE AMP (~~LOC~~) NOT AT ARMC
Chlamydia: NEGATIVE
Neisseria Gonorrhea: NEGATIVE

## 2017-09-05 ENCOUNTER — Telehealth (HOSPITAL_COMMUNITY): Payer: Self-pay | Admitting: *Deleted

## 2017-09-05 ENCOUNTER — Encounter (HOSPITAL_COMMUNITY): Payer: Self-pay

## 2017-09-05 NOTE — Telephone Encounter (Signed)
Preadmission screen  

## 2017-09-06 ENCOUNTER — Encounter (HOSPITAL_COMMUNITY): Payer: Self-pay | Admitting: *Deleted

## 2017-09-06 ENCOUNTER — Other Ambulatory Visit: Payer: Self-pay | Admitting: Advanced Practice Midwife

## 2017-09-06 ENCOUNTER — Inpatient Hospital Stay (HOSPITAL_COMMUNITY)
Admission: AD | Admit: 2017-09-06 | Discharge: 2017-09-06 | Disposition: A | Discharge status: Home / Self Care | Payer: Medicare Other | Source: Ambulatory Visit | Attending: Obstetrics and Gynecology | Admitting: Obstetrics and Gynecology

## 2017-09-06 DIAGNOSIS — O10013 Pre-existing essential hypertension complicating pregnancy, third trimester: Secondary | ICD-10-CM | POA: Insufficient documentation

## 2017-09-06 DIAGNOSIS — G43909 Migraine, unspecified, not intractable, without status migrainosus: Secondary | ICD-10-CM | POA: Diagnosis not present

## 2017-09-06 DIAGNOSIS — O10913 Unspecified pre-existing hypertension complicating pregnancy, third trimester: Secondary | ICD-10-CM | POA: Diagnosis not present

## 2017-09-06 DIAGNOSIS — Z87891 Personal history of nicotine dependence: Secondary | ICD-10-CM | POA: Insufficient documentation

## 2017-09-06 DIAGNOSIS — Z7982 Long term (current) use of aspirin: Secondary | ICD-10-CM | POA: Insufficient documentation

## 2017-09-06 DIAGNOSIS — G43809 Other migraine, not intractable, without status migrainosus: Secondary | ICD-10-CM

## 2017-09-06 DIAGNOSIS — O99353 Diseases of the nervous system complicating pregnancy, third trimester: Secondary | ICD-10-CM | POA: Insufficient documentation

## 2017-09-06 DIAGNOSIS — R1030 Lower abdominal pain, unspecified: Secondary | ICD-10-CM | POA: Diagnosis present

## 2017-09-06 DIAGNOSIS — Z3A36 36 weeks gestation of pregnancy: Secondary | ICD-10-CM | POA: Diagnosis not present

## 2017-09-06 LAB — URINALYSIS, ROUTINE W REFLEX MICROSCOPIC
Bilirubin Urine: NEGATIVE
Glucose, UA: NEGATIVE mg/dL
Hgb urine dipstick: NEGATIVE
Ketones, ur: 20 mg/dL — AB
Nitrite: NEGATIVE
Protein, ur: 30 mg/dL — AB
SPECIFIC GRAVITY, URINE: 1.016 (ref 1.005–1.030)
pH: 7 (ref 5.0–8.0)

## 2017-09-06 LAB — COMPREHENSIVE METABOLIC PANEL
ALK PHOS: 101 U/L (ref 38–126)
ALT: 13 U/L — ABNORMAL LOW (ref 14–54)
ANION GAP: 10 (ref 5–15)
AST: 21 U/L (ref 15–41)
Albumin: 3.2 g/dL — ABNORMAL LOW (ref 3.5–5.0)
BUN: 8 mg/dL (ref 6–20)
CALCIUM: 9.3 mg/dL (ref 8.9–10.3)
CO2: 21 mmol/L — AB (ref 22–32)
Chloride: 105 mmol/L (ref 101–111)
Creatinine, Ser: 0.51 mg/dL (ref 0.44–1.00)
Glucose, Bld: 99 mg/dL (ref 65–99)
Potassium: 3.8 mmol/L (ref 3.5–5.1)
SODIUM: 136 mmol/L (ref 135–145)
TOTAL PROTEIN: 7.3 g/dL (ref 6.5–8.1)
Total Bilirubin: 0.2 mg/dL — ABNORMAL LOW (ref 0.3–1.2)

## 2017-09-06 LAB — CBC
HCT: 36.3 % (ref 36.0–46.0)
HEMOGLOBIN: 12.3 g/dL (ref 12.0–15.0)
MCH: 28 pg (ref 26.0–34.0)
MCHC: 33.9 g/dL (ref 30.0–36.0)
MCV: 82.7 fL (ref 78.0–100.0)
Platelets: 177 10*3/uL (ref 150–400)
RBC: 4.39 MIL/uL (ref 3.87–5.11)
RDW: 13.7 % (ref 11.5–15.5)
WBC: 6.2 10*3/uL (ref 4.0–10.5)

## 2017-09-06 LAB — PROTEIN / CREATININE RATIO, URINE
CREATININE, URINE: 116 mg/dL
PROTEIN CREATININE RATIO: 0.26 mg/mg{creat} — AB (ref 0.00–0.15)
TOTAL PROTEIN, URINE: 30 mg/dL

## 2017-09-06 LAB — WET PREP, GENITAL
CLUE CELLS WET PREP: NONE SEEN
Sperm: NONE SEEN
Trich, Wet Prep: NONE SEEN
YEAST WET PREP: NONE SEEN

## 2017-09-06 MED ORDER — ONDANSETRON HCL 4 MG PO TABS
4.0000 mg | ORAL_TABLET | Freq: Once | ORAL | Status: AC
  Administered 2017-09-06: 4 mg via ORAL
  Filled 2017-09-06: qty 1

## 2017-09-06 MED ORDER — BUTALBITAL-APAP-CAFFEINE 50-325-40 MG PO TABS: 1.0000 | ORAL_TABLET | Freq: Four times a day (QID) | ORAL | 0 refills | Status: DC | PRN

## 2017-09-06 MED ORDER — BUTALBITAL-APAP-CAFFEINE 50-325-40 MG PO TABS
2.0000 | ORAL_TABLET | Freq: Once | ORAL | Status: AC
  Administered 2017-09-06: 2 via ORAL
  Filled 2017-09-06: qty 2

## 2017-09-06 NOTE — MAU Note (Signed)
Migraine started last night.  Cramping in lower abd.  Denies leaking or bleeding.   Thinks she had a fever last night, didn't check temp, was sweating.  Pt has mask on to protect herself.

## 2017-09-06 NOTE — MAU Note (Signed)
Migraine since last night. Took tylenol earlier, no relief

## 2017-09-06 NOTE — MAU Provider Note (Signed)
History     CSN: [id number]  Arrival date and time: 09/06/17 1739   None     Chief Complaint  Patient presents with  . Abdominal Pain  . Headache   26 yo G3P0110 at [redacted]w[redacted]d here for headache x 2 days and lower abdominal cramping x 2 days with vaginal discharge. She admits to light vaginal spotting when she wipes. She admits to intermittent contractions. Her pregnancy is complicated by chronic hypertension for which she does not take medication, Sjogrens, and previous pregnancy with IUFD. Patient is scheduled for induction of labor next Tuesday.     OB History    Gravida Para Term Preterm AB Living   3 1 0 1 1 0   SAB TAB Ectopic Multiple Live Births   1 0 0 0 0      Past Medical History:  Diagnosis Date  . Arthritis    "hands and legs" (01/08/2015)  . CAP (community acquired pneumonia) 01/07/2015  . Daily headache    "sometimes" (01/08/2015)  . GERD (gastroesophageal reflux disease)   . Hypertension   . Lung disease   . Lupus   . Pulmonary hypertension (HCC)   . Sjogren's syndrome [facility])     Past Surgical History:  Procedure Laterality Date  . CARDIAC CATHETERIZATION N/A 09/09/2015   Procedure: Right Heart Cath;  Surgeon: Laurey Morale, MD;  Location: [facility] INVASIVE CV LAB;  Service: Cardiovascular;  Laterality: N/A;  . DILATION AND EVACUATION N/A 07/13/2015   Procedure: DILATATION AND EVACUATION;  Surgeon: Levie Heritage, DO;  Location: [facility] ORS;  Service: Gynecology;  Laterality: N/A;  . FINGER SURGERY Right 03/2014   "laceration, nerve/artery injury" 2nd digit  . VIDEO BRONCHOSCOPY Bilateral 01/12/2015   Procedure: VIDEO BRONCHOSCOPY WITH FLUORO;  Surgeon: Leslye Peer, MD;  Location[facility] ENDOSCOPY;  Service: Cardiopulmonary;  Laterality: Bilateral;    Family History  Problem Relation Age of Onset  . Diabetes Mother   . Diabetes Maternal Aunt   . Diabetes Maternal Grandmother     Social History  Substance Use Topics  . Smoking status: Former Smoker   Packs/day: 0.10    Years: 5.00    Types: Cigarettes    Quit date: 11/20/2014  . Smokeless tobacco: Never Used  . Alcohol use No    Allergies:  Allergies  Allergen Reactions  . Hydrocodone Nausea And Vomiting  . Zithromax [Azithromycin] Itching and Cough    Prescriptions Prior to Admission  Medication Sig Dispense Refill Last Dose  . albuterol (PROVENTIL HFA;VENTOLIN HFA) 108 (90 Base) MCG/ACT inhaler Inhale 2 puffs into the lungs every 4 (four) hours as needed for wheezing or shortness of breath. 1 Inhaler 5 Taking  . aspirin EC 81 MG tablet Take 1 tablet (81 mg total) by mouth daily. 30 tablet 2 Taking  . azaTHIOprine (IMURAN) 50 MG tablet Take 1 tablet (50 mg total) by mouth daily. (Patient not taking: Reported on 08/31/2017) 90 tablet 1 Not Taking  . cyclobenzaprine (FLEXERIL) 10 MG tablet Take 0.5-1 tablets (5-10 mg total) by mouth 3 (three) times daily as needed for muscle spasms. (Patient not taking: Reported on 08/31/2017) 20 tablet 0 Not Taking  . fluticasone (FLONASE) 50 MCG/ACT nasal spray Place 2 sprays into both nostrils daily. (Patient taking differently: Place 2 sprays into both nostrils daily as needed for rhinitis. ) 16 g 2 Taking  . folic acid (FOLVITE) 1 MG tablet Take 1 tablet (1 mg total) by mouth daily. 100 tablet 5 Taking  .  hydroxychloroquine (PLAQUENIL) 200 MG tablet Take 200 mg by mouth 2 (two) times daily.   10 Taking  . loperamide (IMODIUM A-D) 2 MG tablet Take 1 tablet (2 mg total) by mouth 4 (four) times daily as needed for diarrhea or loose stools. (Patient not taking: Reported on 08/23/2017) 30 tablet 0 Not Taking  . pantoprazole (PROTONIX) 40 MG tablet Take 1 tablet (40 mg total) by mouth daily. 30 tablet 0   . predniSONE (DELTASONE) 5 MG tablet Take 10 mg by mouth daily with breakfast.    Taking  . Prenatal Vit-Fe Fumarate-FA (PRENATAL COMPLETE) 14-0.4 MG TABS Take 1 tablet by mouth daily. 30 each 0 Taking  . promethazine (PHENERGAN) 25 MG tablet Take 1  tablet (25 mg total) by mouth every 6 (six) hours as needed for nausea or vomiting. (Patient not taking: Reported on 08/31/2017) 30 tablet 0 Not Taking  . ranitidine (ZANTAC) 150 MG tablet Take 150 mg by mouth 2 (two) times daily as needed for heartburn.   Taking    Review of Systems  Constitutional: Negative for chills and fatigue.  HENT: Negative for congestion and ear discharge.   Eyes: Negative for discharge and itching.  Respiratory: Negative for cough and shortness of breath.   Cardiovascular: Negative for chest pain and palpitations.  Gastrointestinal: Positive for abdominal pain.  Genitourinary: Positive for vaginal discharge. Negative for dysuria and vaginal bleeding.  Neurological: Negative for dizziness and numbness.   Physical Exam   Blood pressure 128/80, pulse (!) 106, temperature (!) 97.4 F (36.3 C), temperature source Oral, resp. rate 18, last menstrual period 12/27/2016.  Physical Exam  Constitutional: She is oriented to person, place, and time. She appears well-developed and well-nourished. No distress.  HENT:  Head: Normocephalic and atraumatic.  Eyes: Pupils are equal, round, and reactive to light. Conjunctivae and EOM are normal.  Neck: Normal range of motion. Neck supple.  Cardiovascular: Intact distal pulses.   Respiratory: Effort normal. No respiratory distress.  GI: Soft. There is no tenderness.  Genitourinary:  Genitourinary Comments: Speculum exam: no bleeding, white discharge noted Cervical exam: 3/50/-2  Musculoskeletal: Normal range of motion. She exhibits no edema.  Neurological: She is alert and oriented to person, place, and time.  Skin: Skin is warm and dry.  Psychiatric: She has a normal mood and affect. Her behavior is normal.    MAU Course  Procedures EFM: 150 bpm/ mod var/ pos acels- cat 1  MDM Patient has chronic HTN but BP is normal today, so will not work up for preeclampsia. Patient given fioricet for headache. Wet prep pending.  Cervix at 3 cm, will repeat cervical exam in couple hours. Patient is comfortable currently and toco shows contractions every 1 to 2 minutes.   Repeat cervical exam remains unchanged. Patient is apprehensive about going home because she had similar experience when she had IUFD with last pregnancy. Discussed with Dr. Emelda Fear. He agrees that EFM shows cat 1 strip and patient is not in active labor. There is no indication to admit patient at this time. Dr. Emelda Fear requests cmp, cbc, and protein/cr. Since BP is normal, we will not keep patient for results.  She has follow up scheduled at [facility] high risk clinic tomorrow and will get her antenatal testing at that time. Discussed this with the patient and she agrees with the plan. She was given strict labor precautions.   Assessment and Plan  1. Pregnancy at [redacted] weeks gestation- keep appointment tomorrow. Will get antenatal testing tomorrow.  2. Migraine headache- improved with fioricet.  3. Latent labor- labor precautions given.   Kayla [name] 09/06/2017, 6:03 PM

## 2017-09-06 NOTE — Discharge Instructions (Signed)

## 2017-09-07 ENCOUNTER — Ambulatory Visit (INDEPENDENT_AMBULATORY_CARE_PROVIDER_SITE_OTHER): Payer: Medicare Other | Admitting: Obstetrics & Gynecology

## 2017-09-07 ENCOUNTER — Encounter: Payer: Self-pay | Admitting: Family Medicine

## 2017-09-07 ENCOUNTER — Ambulatory Visit (HOSPITAL_COMMUNITY)
Admission: RE | Admit: 2017-09-07 | Discharge: 2017-09-07 | Disposition: A | Discharge status: Home / Self Care | Payer: Medicare Other | Source: Ambulatory Visit | Attending: Family Medicine | Admitting: Family Medicine

## 2017-09-07 ENCOUNTER — Other Ambulatory Visit: Payer: Self-pay

## 2017-09-07 ENCOUNTER — Other Ambulatory Visit (HOSPITAL_COMMUNITY): Payer: Self-pay | Admitting: Obstetrics and Gynecology

## 2017-09-07 ENCOUNTER — Ambulatory Visit (INDEPENDENT_AMBULATORY_CARE_PROVIDER_SITE_OTHER): Payer: Medicare Other | Admitting: *Deleted

## 2017-09-07 ENCOUNTER — Other Ambulatory Visit: Payer: Self-pay | Admitting: Family Medicine

## 2017-09-07 ENCOUNTER — Encounter (HOSPITAL_COMMUNITY): Payer: Self-pay

## 2017-09-07 VITALS — BP 108/68 | HR 102

## 2017-09-07 VITALS — BP 113/71 | HR 104

## 2017-09-07 DIAGNOSIS — Z8759 Personal history of other complications of pregnancy, childbirth and the puerperium: Secondary | ICD-10-CM

## 2017-09-07 DIAGNOSIS — O10913 Unspecified pre-existing hypertension complicating pregnancy, third trimester: Secondary | ICD-10-CM | POA: Insufficient documentation

## 2017-09-07 DIAGNOSIS — O0990 Supervision of high risk pregnancy, unspecified, unspecified trimester: Secondary | ICD-10-CM

## 2017-09-07 DIAGNOSIS — O99113 Other diseases of the blood and blood-forming organs and certain disorders involving the immune mechanism complicating pregnancy, third trimester: Secondary | ICD-10-CM | POA: Insufficient documentation

## 2017-09-07 DIAGNOSIS — M329 Systemic lupus erythematosus, unspecified: Secondary | ICD-10-CM | POA: Insufficient documentation

## 2017-09-07 DIAGNOSIS — D6862 Lupus anticoagulant syndrome: Secondary | ICD-10-CM | POA: Diagnosis not present

## 2017-09-07 DIAGNOSIS — Z3A36 36 weeks gestation of pregnancy: Secondary | ICD-10-CM | POA: Diagnosis not present

## 2017-09-07 DIAGNOSIS — O99112 Other diseases of the blood and blood-forming organs and certain disorders involving the immune mechanism complicating pregnancy, second trimester: Secondary | ICD-10-CM

## 2017-09-07 DIAGNOSIS — O10919 Unspecified pre-existing hypertension complicating pregnancy, unspecified trimester: Secondary | ICD-10-CM

## 2017-09-07 DIAGNOSIS — O9989 Other specified diseases and conditions complicating pregnancy, childbirth and the puerperium: Principal | ICD-10-CM

## 2017-09-07 DIAGNOSIS — O99891 Other specified diseases and conditions complicating pregnancy: Secondary | ICD-10-CM

## 2017-09-07 DIAGNOSIS — O26893 Other specified pregnancy related conditions, third trimester: Secondary | ICD-10-CM | POA: Insufficient documentation

## 2017-09-07 DIAGNOSIS — O0993 Supervision of high risk pregnancy, unspecified, third trimester: Secondary | ICD-10-CM

## 2017-09-07 NOTE — Patient Instructions (Signed)
Preterm Labor and Birth Information  The normal length of a pregnancy is 39-41 weeks. Preterm labor is when labor starts before 37 completed weeks of pregnancy.  What are the risk factors for preterm labor?  Preterm labor is more likely to occur in women who:   Have certain infections during pregnancy such as a bladder infection, sexually transmitted infection, or infection inside the uterus (chorioamnionitis).   Have a shorter-than-normal cervix.   Have gone into preterm labor before.   Have had surgery on their cervix.   Are younger than age 17 or older than age 35.   Are African American.   Are pregnant with twins or multiple babies (multiple gestation).   Take street drugs or smoke while pregnant.   Do not gain enough weight while pregnant.   Became pregnant shortly after having been pregnant.    What are the symptoms of preterm labor?  Symptoms of preterm labor include:   Cramps similar to those that can happen during a menstrual period. The cramps may happen with diarrhea.   Pain in the abdomen or lower back.   Regular uterine contractions that may feel like tightening of the abdomen.   A feeling of increased pressure in the pelvis.   Increased watery or bloody mucus discharge from the vagina.   Water breaking (ruptured amniotic sac).    Why is it important to recognize signs of preterm labor?  It is important to recognize signs of preterm labor because babies who are born prematurely may not be fully developed. This can put them at an increased risk for:   Long-term (chronic) heart and lung problems.   Difficulty immediately after birth with regulating body systems, including blood sugar, body temperature, heart rate, and breathing rate.   Bleeding in the brain.   Cerebral palsy.   Learning difficulties.   Death.    These risks are highest for babies who are born before 34 weeks of pregnancy.  How is preterm labor treated?  Treatment depends on the length of your pregnancy, your  condition, and the health of your baby. It may involve:   Having a stitch (suture) placed in your cervix to prevent your cervix from opening too early (cerclage).   Taking or being given medicines, such as:  ? Hormone medicines. These may be given early in pregnancy to help support the pregnancy.  ? Medicine to stop contractions.  ? Medicines to help mature the baby's lungs. These may be prescribed if the risk of delivery is high.  ? Medicines to prevent your baby from developing cerebral palsy.    If the labor happens before 34 weeks of pregnancy, you may need to stay in the hospital.  What should I do if I think I am in preterm labor?  If you think that you are going into preterm labor, call your health care provider right away.  How can I prevent preterm labor in future pregnancies?  To increase your chance of having a full-term pregnancy:   Do not use any tobacco products, such as cigarettes, chewing tobacco, and e-cigarettes. If you need help quitting, ask your health care provider.   Do not use street drugs or medicines that have not been prescribed to you during your pregnancy.   Talk with your health care provider before taking any herbal supplements, even if you have been taking them regularly.   Make sure you gain a healthy amount of weight during your pregnancy.   Watch for infection. If you   think that you might have an infection, get it checked right away.   Make sure to tell your health care provider if you have gone into preterm labor before.    This information is not intended to replace advice given to you by your health care provider. Make sure you discuss any questions you have with your health care provider.  Document Released: 01/28/2004 Document Revised: 04/19/2016 Document Reviewed: 03/30/2016  Elsevier Interactive Patient Education  2018 [employer]

## 2017-09-07 NOTE — Progress Notes (Signed)
   PRENATAL VISIT NOTE  Subjective:  Kayla [name] is a 26 y.o. G3P0110 at 5179w2d being seen today for ongoing prenatal care.  She is currently monitored for the following issues for this high-risk pregnancy and has Interstitial lung disease (HCC); Supervision of high risk pregnancy, antepartum; Systemic lupus erythematosus (SLE) affecting pregnancy, antepartum (HCC); Lupus (systemic lupus erythematosus) (HCC); Loud P2 (pulmonary S2, second heart sound); Insomnia; Other secondary pulmonary hypertension (HCC); Chronic hypertension; Chronic hypertension during pregnancy, antepartum; Sjogren's syndrome (HCC); Chronic Respiratory failure with oxygen requirement affecting pregnancy, antepartum; SS-A antibody positive; SS-B antibody positive; History of IUFD; and Gonorrhea affecting pregnancy on her problem list.  Patient reports headache.  Contractions: Irregular. Vag. Bleeding: None.  Movement: Present. Denies leaking of fluid.   The following portions of the patient's history were reviewed and updated as appropriate: allergies, current medications, past family history, past medical history, past social history, past surgical history and problem list. Problem list updated.  Objective:   Vitals:   09/07/17 1021  BP: 108/68  Pulse: (!) 102    Fetal Status: Fetal Heart Rate (bpm): NST Fundal Height: 36 cm Movement: Present     General:  Alert, oriented and cooperative. Patient is in no acute distress.  Skin: Skin is warm and dry. No rash noted.   Cardiovascular: Normal heart rate noted  Respiratory: Normal respiratory effort, no problems with respiration noted  Abdomen: Soft, gravid, appropriate for gestational age.  Pain/Pressure: Present     Pelvic: Cervical exam performed        Extremities: Normal range of motion.     Mental Status:  Normal mood and affect. Normal behavior. Normal judgment and thought content.   Assessment and Plan:  Pregnancy: G3P0110 at 2379w2d  1. Supervision of high  risk pregnancy, antepartum US results and NST reviewed - Strep Gp B NAA  2. History of IUFD Fetal surveillance nl  3. Chronic hypertension during pregnancy, antepartum Nl BP  4. Systemic lupus erythematosus (SLE) affecting pregnancy, antepartum (HCC)   Preterm labor symptoms and general obstetric precautions including but not limited to vaginal bleeding, contractions, leaking of fluid and fetal movement were reviewed in detail with the patient. Please refer to After Visit Summary for other counseling recommendations.  Return in about 5 weeks (around 10/12/2017) for PP visit.  IOL on 10/23. BPP is 8/8 and NST rx, IOL 37 weeks  Scheryl DarterJames Arnold, MD

## 2017-09-07 NOTE — Progress Notes (Signed)
Pt had MAU visit yesterday for R/O labor. US for growth and BPP done today.  IOL scheduled 10/23.  Plans Depo Provera for contraception.

## 2017-09-09 LAB — STREP GP B NAA: Strep Gp B NAA: NEGATIVE

## 2017-09-12 ENCOUNTER — Inpatient Hospital Stay (HOSPITAL_COMMUNITY): Payer: Medicare Other | Admitting: Anesthesiology

## 2017-09-12 ENCOUNTER — Encounter (HOSPITAL_COMMUNITY): Payer: Self-pay

## 2017-09-12 ENCOUNTER — Inpatient Hospital Stay (HOSPITAL_COMMUNITY)
Admission: RE | Admit: 2017-09-12 | Discharge: 2017-09-15 | DRG: 806 | Disposition: A | Discharge status: Home / Self Care | Payer: Medicare Other | Source: Ambulatory Visit | Attending: Family Medicine | Admitting: Family Medicine

## 2017-09-12 DIAGNOSIS — Z9981 Dependence on supplemental oxygen: Secondary | ICD-10-CM

## 2017-09-12 DIAGNOSIS — Z3A37 37 weeks gestation of pregnancy: Secondary | ICD-10-CM

## 2017-09-12 DIAGNOSIS — J849 Interstitial pulmonary disease, unspecified: Secondary | ICD-10-CM | POA: Diagnosis present

## 2017-09-12 DIAGNOSIS — D6862 Lupus anticoagulant syndrome: Secondary | ICD-10-CM | POA: Diagnosis not present

## 2017-09-12 DIAGNOSIS — O10919 Unspecified pre-existing hypertension complicating pregnancy, unspecified trimester: Secondary | ICD-10-CM | POA: Diagnosis present

## 2017-09-12 DIAGNOSIS — O9952 Diseases of the respiratory system complicating childbirth: Secondary | ICD-10-CM | POA: Diagnosis present

## 2017-09-12 DIAGNOSIS — O9912 Other diseases of the blood and blood-forming organs and certain disorders involving the immune mechanism complicating childbirth: Secondary | ICD-10-CM | POA: Diagnosis not present

## 2017-09-12 DIAGNOSIS — M329 Systemic lupus erythematosus, unspecified: Secondary | ICD-10-CM | POA: Diagnosis present

## 2017-09-12 DIAGNOSIS — O1092 Unspecified pre-existing hypertension complicating childbirth: Secondary | ICD-10-CM | POA: Diagnosis not present

## 2017-09-12 DIAGNOSIS — O9989 Other specified diseases and conditions complicating pregnancy, childbirth and the puerperium: Principal | ICD-10-CM | POA: Diagnosis present

## 2017-09-12 DIAGNOSIS — Z7982 Long term (current) use of aspirin: Secondary | ICD-10-CM | POA: Diagnosis not present

## 2017-09-12 DIAGNOSIS — Z87891 Personal history of nicotine dependence: Secondary | ICD-10-CM | POA: Diagnosis not present

## 2017-09-12 DIAGNOSIS — O1002 Pre-existing essential hypertension complicating childbirth: Secondary | ICD-10-CM | POA: Diagnosis present

## 2017-09-12 DIAGNOSIS — M3500 Sicca syndrome, unspecified: Secondary | ICD-10-CM | POA: Diagnosis present

## 2017-09-12 DIAGNOSIS — O9962 Diseases of the digestive system complicating childbirth: Secondary | ICD-10-CM | POA: Diagnosis present

## 2017-09-12 LAB — CBC
HEMATOCRIT: 38 % (ref 36.0–46.0)
HEMATOCRIT: 35.2 % — AB (ref 36.0–46.0)
HEMOGLOBIN: 13 g/dL (ref 12.0–15.0)
HEMOGLOBIN: 12 g/dL (ref 12.0–15.0)
MCH: 27.8 pg (ref 26.0–34.0)
MCH: 27.8 pg (ref 26.0–34.0)
MCHC: 34.2 g/dL (ref 30.0–36.0)
MCHC: 34.1 g/dL (ref 30.0–36.0)
MCV: 81.2 fL (ref 78.0–100.0)
MCV: 81.7 fL (ref 78.0–100.0)
PLATELETS: 178 10*3/uL (ref 150–400)
Platelets: 160 10*3/uL (ref 150–400)
RBC: 4.68 MIL/uL (ref 3.87–5.11)
RBC: 4.31 MIL/uL (ref 3.87–5.11)
RDW: 14 % (ref 11.5–15.5)
RDW: 14.1 % (ref 11.5–15.5)
WBC: 6.1 10*3/uL (ref 4.0–10.5)
WBC: 6.7 10*3/uL (ref 4.0–10.5)

## 2017-09-12 LAB — TYPE AND SCREEN
ABO/RH(D): O POS
ANTIBODY SCREEN: NEGATIVE

## 2017-09-12 LAB — RPR: RPR: NONREACTIVE

## 2017-09-12 LAB — HEPATITIS B SURFACE ANTIGEN: HEP B S AG: NEGATIVE

## 2017-09-12 LAB — MRSA PCR SCREENING: MRSA by PCR: NEGATIVE

## 2017-09-12 MED ORDER — LACTATED RINGERS IV SOLN: 500.0000 mL | INTRAVENOUS | Status: DC | PRN

## 2017-09-12 MED ORDER — LACTATED RINGERS IV SOLN
INTRAVENOUS | Status: DC
  Administered 2017-09-12: 16:00:00 via INTRAUTERINE

## 2017-09-12 MED ORDER — PHENYLEPHRINE 40 MCG/ML (10ML) SYRINGE FOR IV PUSH (FOR BLOOD PRESSURE SUPPORT): 80.0000 ug | PREFILLED_SYRINGE | INTRAVENOUS | Status: DC | PRN

## 2017-09-12 MED ORDER — LACTATED RINGERS IV SOLN
500.0000 mL | Freq: Once | INTRAVENOUS | Status: AC
  Administered 2017-09-12: 500 mL via INTRAVENOUS

## 2017-09-12 MED ORDER — LACTATED RINGERS IV SOLN: 500.0000 mL | Freq: Once | INTRAVENOUS | Status: AC

## 2017-09-12 MED ORDER — GENTAMICIN SULFATE 40 MG/ML IJ SOLN
160.0000 mg | Freq: Three times a day (TID) | INTRAVENOUS | Status: DC
  Administered 2017-09-12: 160 mg via INTRAVENOUS
  Filled 2017-09-12 (×3): qty 4

## 2017-09-12 MED ORDER — PREDNISONE 10 MG PO TABS
10.0000 mg | ORAL_TABLET | Freq: Every day | ORAL | Status: DC
Start: 2017-09-12 — End: 2017-09-13
  Administered 2017-09-12: 10 mg via ORAL
  Filled 2017-09-12 (×2): qty 1

## 2017-09-12 MED ORDER — FENTANYL 2.5 MCG/ML BUPIVACAINE 1/10 % EPIDURAL INFUSION (WH - ANES)
14.0000 mL/h | INTRAMUSCULAR | Status: DC | PRN
  Administered 2017-09-12 (×2): 14 mL/h via EPIDURAL
  Filled 2017-09-12 (×2): qty 100

## 2017-09-12 MED ORDER — ALBUTEROL SULFATE (2.5 MG/3ML) 0.083% IN NEBU
3.0000 mL | INHALATION_SOLUTION | RESPIRATORY_TRACT | Status: DC | PRN
  Administered 2017-09-12 – 2017-09-13 (×3): 3 mL via RESPIRATORY_TRACT
  Filled 2017-09-12 (×3): qty 3

## 2017-09-12 MED ORDER — ONDANSETRON HCL 4 MG/2ML IJ SOLN
4.0000 mg | Freq: Four times a day (QID) | INTRAMUSCULAR | Status: DC | PRN
  Administered 2017-09-12: 4 mg via INTRAVENOUS
  Filled 2017-09-12: qty 2

## 2017-09-12 MED ORDER — DIPHENHYDRAMINE HCL 50 MG/ML IJ SOLN: 12.5000 mg | INTRAMUSCULAR | Status: DC | PRN

## 2017-09-12 MED ORDER — ACETAMINOPHEN 325 MG PO TABS
650.0000 mg | ORAL_TABLET | ORAL | Status: DC | PRN
  Administered 2017-09-12 – 2017-09-13 (×2): 650 mg via ORAL
  Filled 2017-09-12 (×2): qty 2

## 2017-09-12 MED ORDER — EPHEDRINE 5 MG/ML INJ: 10.0000 mg | INTRAVENOUS | Status: DC | PRN

## 2017-09-12 MED ORDER — PHENYLEPHRINE 40 MCG/ML (10ML) SYRINGE FOR IV PUSH (FOR BLOOD PRESSURE SUPPORT)
80.0000 ug | PREFILLED_SYRINGE | INTRAVENOUS | Status: DC | PRN
  Filled 2017-09-12: qty 10

## 2017-09-12 MED ORDER — HYDROXYCHLOROQUINE SULFATE 200 MG PO TABS
200.0000 mg | ORAL_TABLET | Freq: Two times a day (BID) | ORAL | Status: DC
  Administered 2017-09-12 (×2): 200 mg via ORAL
  Filled 2017-09-12 (×3): qty 1

## 2017-09-12 MED ORDER — OXYTOCIN 40 UNITS IN LACTATED RINGERS INFUSION - SIMPLE MED: 2.5000 [IU]/h | INTRAVENOUS | Status: DC

## 2017-09-12 MED ORDER — OXYTOCIN BOLUS FROM INFUSION
500.0000 mL | Freq: Once | INTRAVENOUS | Status: AC
  Administered 2017-09-13: 500 mL via INTRAVENOUS

## 2017-09-12 MED ORDER — LIDOCAINE HCL (PF) 1 % IJ SOLN
INTRAMUSCULAR | Status: DC | PRN
  Administered 2017-09-12: 4 mL via EPIDURAL
  Administered 2017-09-12: 8 mL via EPIDURAL

## 2017-09-12 MED ORDER — MISOPROSTOL 25 MCG QUARTER TABLET: 25.0000 ug | ORAL_TABLET | ORAL | Status: DC | PRN

## 2017-09-12 MED ORDER — OXYTOCIN 40 UNITS IN LACTATED RINGERS INFUSION - SIMPLE MED: 1.0000 m[IU]/min | INTRAVENOUS | Status: DC

## 2017-09-12 MED ORDER — SODIUM CHLORIDE 0.9 % IV SOLN
2.0000 g | Freq: Four times a day (QID) | INTRAVENOUS | Status: DC
  Administered 2017-09-12 – 2017-09-13 (×2): 2 g via INTRAVENOUS
  Filled 2017-09-12 (×4): qty 2000

## 2017-09-12 MED ORDER — SOD CITRATE-CITRIC ACID 500-334 MG/5ML PO SOLN: 30.0000 mL | ORAL | Status: DC | PRN

## 2017-09-12 MED ORDER — FENTANYL CITRATE (PF) 100 MCG/2ML IJ SOLN
100.0000 ug | INTRAMUSCULAR | Status: DC | PRN
  Administered 2017-09-12 (×2): 100 ug via INTRAVENOUS
  Filled 2017-09-12 (×2): qty 2

## 2017-09-12 MED ORDER — TERBUTALINE SULFATE 1 MG/ML IJ SOLN: 0.2500 mg | Freq: Once | INTRAMUSCULAR | Status: DC | PRN

## 2017-09-12 MED ORDER — LIDOCAINE HCL (PF) 1 % IJ SOLN
30.0000 mL | INTRAMUSCULAR | Status: DC | PRN
  Filled 2017-09-12: qty 30

## 2017-09-12 MED ORDER — LACTATED RINGERS IV SOLN
INTRAVENOUS | Status: DC
  Administered 2017-09-12 (×3): via INTRAVENOUS

## 2017-09-12 MED ORDER — OXYTOCIN 40 UNITS IN LACTATED RINGERS INFUSION - SIMPLE MED
1.0000 m[IU]/min | INTRAVENOUS | Status: DC
  Administered 2017-09-12: 2 m[IU]/min via INTRAVENOUS
  Administered 2017-09-12: 1 m[IU]/min via INTRAVENOUS
  Filled 2017-09-12: qty 1000

## 2017-09-12 NOTE — H&P (Signed)
LABOR AND DELIVERY ADMISSION HISTORY AND PHYSICAL NOTE  Kayla PonsLashonna [name] is a 26 y.o. female 103P0110 with IUP at 117w0d by LMP presenting for IOL for SLE.  She reports positive fetal movement. She denies leakage of fluid or vaginal bleeding.  Prenatal History/Complications: [facility] Pregnancy complications:  - SLE: on plaquenil and prednisone - Sjogren's disease - Interstitial lung disease: report intermittent O2 use at home; albuterol prn - CHTN: not on meds - Hx of IUFD  Past Medical History: Past Medical History:  Diagnosis Date  . Arthritis    "hands and legs" (01/08/2015)  . CAP (community acquired pneumonia) 01/07/2015  . Daily headache    "sometimes" (01/08/2015)  . GERD (gastroesophageal reflux disease)   . Hypertension   . Lung disease   . Lupus   . Pulmonary hypertension (HCC)   . Sjogren's syndrome [facility](HCC)     Past Surgical History: Past Surgical History:  Procedure Laterality Date  . CARDIAC CATHETERIZATION N/A 09/09/2015   Procedure: Right Heart Cath;  Surgeon: Laurey Moralealton S McLean, MD;  [facility];  Service: Cardiovascular;  Laterality: N/A;  . DILATION AND EVACUATION N/A 07/13/2015   Procedure: DILATATION AND EVACUATION;  Surgeon: Levie HeritageJacob J Stinson, DO;  Location: [facility] ORS;  Service: Gynecology;  Laterality: N/A;  . FINGER SURGERY Right 03/2014   "laceration, nerve/artery injury" 2nd digit  . VIDEO BRONCHOSCOPY Bilateral 01/12/2015   Procedure: VIDEO BRONCHOSCOPY WITH FLUORO;  Surgeon: Leslye Peerobert S Byrum, MD;  [facility];  Service: Cardiopulmonary;  Laterality: Bilateral;    Obstetrical History: OB History    Gravida Para Term Preterm AB Living   3 1 0 1 1 0   SAB TAB Ectopic Multiple Live Births   1 0 0 0 0      Social History: Social History   Social History  . Marital status: Single    Spouse name: N/A  . Number of children: N/A  . Years of education: N/A   Occupational History  . [employer]      Workers Compensation   Social  History Main Topics  . Smoking status: Former Smoker    Packs/day: 0.10    Years: 5.00    Types: Cigarettes    Quit date: 11/20/2014  . Smokeless tobacco: Never Used  . Alcohol use No  . Drug use: No  . Sexual activity: Yes    Birth control/ protection: None     Comment: intercours on 03/07/16   Other Topics Concern  . None   Social History Narrative  . None    Family History: Family History  Problem Relation Age of Onset  . Diabetes Mother   . Diabetes Maternal Aunt   . Diabetes Maternal Grandmother     Allergies: Allergies  Allergen Reactions  . Hydrocodone Nausea And Vomiting  . Zithromax [Azithromycin] Itching and Cough    Prescriptions Prior to Admission  Medication Sig Dispense Refill Last Dose  . acetaminophen (TYLENOL) 500 MG tablet Take 1,000 mg by mouth every 6 (six) hours as needed for mild pain or headache.   Past Week at Unknown time  . albuterol (PROVENTIL HFA;VENTOLIN HFA) 108 (90 Base) MCG/ACT inhaler Inhale 2 puffs into the lungs every 4 (four) hours as needed for wheezing or shortness of breath. 1 Inhaler 5 09/12/2017 at Unknown time  . aspirin EC 81 MG tablet Take 1 tablet (81 mg total) by mouth daily. 30 tablet 2 09/11/2017 at Unknown time  . butalbital-acetaminophen-caffeine (FIORICET, ESGIC) 50-325-40 MG tablet  Take 1 tablet by mouth every 6 (six) hours as needed for headache. 20 tablet 0 Past Week at Unknown time  . fluticasone (FLONASE) 50 MCG/ACT nasal spray Place 2 sprays into both nostrils daily. (Patient taking differently: Place 2 sprays into both nostrils daily as needed for rhinitis. ) 16 g 2 Past Week at Unknown time  . folic acid (FOLVITE) 1 MG tablet Take 1 tablet (1 mg total) by mouth daily. 100 tablet 5 09/11/2017 at Unknown time  . hydroxychloroquine (PLAQUENIL) 200 MG tablet Take 200 mg by mouth 2 (two) times daily.   10 09/11/2017 at Unknown time  . pantoprazole (PROTONIX) 40 MG tablet Take 1 tablet (40 mg total) by mouth daily. 30  tablet 0 09/11/2017 at Unknown time  . predniSONE (DELTASONE) 5 MG tablet Take 10 mg by mouth daily with breakfast.    09/11/2017 at Unknown time  . Prenatal Vit-Fe Fumarate-FA (PRENATAL COMPLETE) 14-0.4 MG TABS Take 1 tablet by mouth daily. 30 each 0 09/11/2017 at Unknown time  . ranitidine (ZANTAC) 150 MG tablet Take 150 mg by mouth 2 (two) times daily as needed for heartburn.   Past Week at Unknown time     Review of Systems  All systems reviewed and negative except as stated in HPI  Physical Exam Blood pressure 133/78, pulse (!) 120, temperature 98.3 F (36.8 C), temperature source Oral, resp. rate 18, height 5\' 3"  (1.6 m), weight 186 lb (84.4 kg), last menstrual period 12/27/2016, SpO2 96 %. General appearance: alert, NAD Lungs: normal WOB, no respiratory distress Heart: regular rate and rhythm Abdomen: soft, non-tender; gravid, appropriate for GA Extremities: No calf swelling or tenderness Presentation: cephalic Fetal monitoring: baseline rate 140, moderate variability, +acel, no decel Uterine activity: occasional ctx Dilation: 3 Effacement (%): 50 Station: -3 Exam by:: Dr Trustin Chapa  Prenatal labs: ABO, Rh: --/--/O POS (10/23 0701) Antibody: NEG (10/23 0701) Rubella:   RPR: Non Reactive (08/30 1517)  HBsAg:   neg 2017 HIV:   nonreactive GC/Chlamydia: negative GBS: Negative (10/18 1246)  2hr GTT: 65, 107, 78 Genetic screening:  Normal first trim screen Anatomy RU:[id number] focus; 1-2 isolated calcifications are noted adjacent to the stomach  wall - likely an normal anatomic variant  Prenatal Transfer Tool  Maternal Diabetes: No Genetic Screening: Normal Maternal Ultrasounds/Referrals: Abnormal:  Findings:   Isolated EIF (echogenic intracardiac focus); 1-2 isolated calcifications are noted adjacent to the stomach  wall - likely an normal anatomic variant Fetal Ultrasounds or other Referrals:  Fetal echo Maternal Substance Abuse:  No Significant Maternal Medications:   Meds include: Other: Plaquenil, prednisone 5mg  daily Significant Maternal Lab Results: None  Results for orders placed or performed during the hospital encounter of 09/12/17 (from the past 24 hour(s))  MRSA PCR Screening   Collection Time: 09/12/17  6:47 AM  Result Value Ref Range   MRSA by PCR NEGATIVE NEGATIVE  CBC   Collection Time: 09/12/17  7:01 AM  Result Value Ref Range   WBC 6.1 4.0 - 10.5 K/uL   RBC 4.68 3.87 - 5.11 MIL/uL   Hemoglobin 13.0 12.0 - 15.0 g/dL   HCT 91.4 78.2 - 95.6 %   MCV 81.2 78.0 - 100.0 fL   MCH 27.8 26.0 - 34.0 pg   MCHC 34.2 30.0 - 36.0 g/dL   RDW 21.3 08.6 - 57.8 %   Platelets 178 150 - 400 K/uL  Type and screen   Collection Time: 09/12/17  7:01 AM  Result Value Ref Range  ABO/RH(D) O POS    Antibody Screen NEG    Sample Expiration 09/15/2017     Assessment: Kayla [name] is a 26 y.o. G3P0110 at [redacted]w[redacted]d here for IOL for SLE. Pregnancy also complicated by PMH as above  #Labor: Cervix favorable, start IV Pit #Pain: Planning on getting epidural #FWB: Cat I #ID:  GBS neg #MOF: both breast and bottle #MOC: Depo #Circ:  N/a (girl)  Kayla [name] 09/12/2017, 9:02 AM

## 2017-09-12 NOTE — Anesthesia Procedure Notes (Signed)
Epidural Patient location during procedure: OB Start time: 09/12/2017 2:55 PM End time: 09/12/2017 2:59 PM  Staffing Anesthesiologist: Leslye PeerBROCK, THOMAS E Performed: anesthesiologist   Preanesthetic Checklist Completed: patient identified, pre-op evaluation, timeout performed, IV checked, risks and benefits discussed and monitors and equipment checked  Epidural Patient position: sitting Prep: DuraPrep Patient monitoring: continuous pulse ox and blood pressure Approach: midline Location: L3-L4 Injection technique: LOR saline  Needle:  Needle type: Tuohy  Needle gauge: 17 G Needle length: 9 cm Needle insertion depth: 7 cm Catheter size: 19 Gauge Catheter at skin depth: 13 cm Test dose: negative and Other (1% lidocaine)  Additional Notes Patient identified. Risks including, but not limited to, bleeding, infection, nerve damage, paralysis, inadequate analgesia, blood pressure changes, nausea, vomiting, allergic reaction, postpartum back pain, itching, and headache were discussed. Patient expressed understanding and wished to proceed. Sterile prep and drape, including hand hygiene, mask, and sterile gloves were used. The patient was positioned and the spine was prepped. The skin was anesthetized with lidocaine. No paraesthesia or other complication noted. The patient did not experience any signs of intravascular injection such as tinnitus or metallic taste in mouth, nor signs of intrathecal spread such as rapid motor block. Please see nursing notes for vital signs. The patient tolerated the procedure well.   Leslye Peerhomas Brock, MDReason for block:procedure for pain

## 2017-09-12 NOTE — Progress Notes (Signed)
Labor Progress Note Kayla [name] is a 26 y.o. G3P0110 at 2067w0d presented for IOL for lupus S:  Patient is uncomfortable with contractions, requesting pain medication.  O:  BP 135/77   Pulse 97   Temp (!) 97.5 F (36.4 C) (Oral)   Resp 18   Ht 5\' 3"  (1.6 m)   Wt 186 lb (84.4 kg)   LMP 12/27/2016 (Exact Date)   SpO2 96%   BMI 32.95 kg/m   Fetal Tracing:  Baseline: 140bpm Variability: moderate Accels: 15x15 Decels: none  Toco: 2-4   CVE: Dilation: 4 Effacement (%): 60 Station: -2 Presentation: Vertex Exam by:: Rayfield Citizenaroline, CNM    A&P: 26 y.o. G3P0110 467w0d IOL for lupus.  #Labor: Progressing well. AROM with small amount of clear fluid #Pain: epidural #FWB: Cat 1 #GBS negative   Rolm Bookbinderaroline M Neill, CNM 2:10 PM

## 2017-09-12 NOTE — Anesthesia Preprocedure Evaluation (Signed)
Anesthesia Evaluation  Patient identified by MRN, date of birth, ID band Patient awake    Reviewed: Allergy & Precautions, NPO status , Patient's Chart, lab work & pertinent test results  History of Anesthesia Complications Negative for: history of anesthetic complications  Airway Mallampati: II  TM Distance: >3 FB Neck ROM: Full    Dental no notable dental hx. 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 On corticosteroid therapy but Cellcept discontinued due to pregnancy. Consideration for initiating Azathioprine in conjunction with [facility]. 2. 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 3. Pneumomediastinum, evident on chest CT May 2016. Likely secondary to severe cough. No evidence of progression. 4. Hypoxic respiratory failure, requirement of 4L supplemental oxygen with peak walk effort. 5. High risk pregnancy in light of above. Significant concern for worsening maternal morbidity and death and fetal harm.    Pulmonary exam normal breath sounds clear to auscultation       Cardiovascular hypertension, Normal cardiovascular exam Rhythm:Regular Rate:Normal  TTE 2018 - Ejection fraction was in the range of 55% to 60%. Grade 1 diastolic dysfunction.   Neuro/Psych  Headaches, negative psych ROS   GI/Hepatic Neg liver ROS, GERD  Medicated and Controlled,  Endo/Other  negative endocrine ROS  Renal/GU negative Renal ROS  negative genitourinary   Musculoskeletal  (+) Arthritis ,   Abdominal   Peds negative pediatric ROS (+)  Hematology negative hematology ROS (+)   Anesthesia Other Findings   Reproductive/Obstetrics (+) Pregnancy (9 weeks missed ab)                              Anesthesia Physical  Anesthesia Plan  ASA: III  Anesthesia Plan: Epidural   Post-op Pain Management:    Induction:   PONV Risk Score and Plan:   Airway Management Planned:   Additional Equipment:   Intra-op Plan:   Post-operative Plan:   Informed Consent: I have reviewed the patients History and Physical, chart, labs and discussed the procedure including the risks, benefits and alternatives for the proposed anesthesia with the patient or authorized representative who has indicated his/her understanding and acceptance.   Dental advisory given  Plan Discussed with:   Anesthesia Plan Comments: (Labs reviewed. Platelets acceptable, patient not taking any blood thinning medications. Risks and benefits discussed with patient, patient expressed understanding and wished to proceed.)        Anesthesia Quick Evaluation

## 2017-09-12 NOTE — Progress Notes (Signed)
Pharmacy Antibiotic Note  Kayla PonsLashonna [name] is a 26 y.o. female admitted on 09/12/2017 for IOL due to SLE.  Pharmacy has been consulted for Gentamicin dosing for maternal temp during labor (Triple I)  Plan: Gentamicin 160mg  IV q8h Will continue to follow and assess need for further kinetic workup  Height: 5\' 3"  (160 cm) Weight: 186 lb (84.4 kg) IBW/kg (Calculated) : 52.4  Adjusted/ Dosing weight: 62 kg  Temp (24hrs), Avg:99.1 F (37.3 C), Min:97.5 F (36.4 C), Max:101.1 F (38.4 C)   Recent Labs Lab 09/06/17 2053 09/12/17 0701 09/12/17 1413  WBC 6.2 6.1 6.7  CREATININE 0.51  --   --     Estimated Creatinine Clearance: 108.7 mL/min (by C-G formula based on SCr of 0.51 mg/dL).    Allergies  Allergen Reactions  . Hydrocodone Nausea And Vomiting  . Zithromax [Azithromycin] Itching and Cough    Antimicrobials this admission: Ampicillin 2 gram IV q6h  Dose adjustments this admission:   Microbiology results:   Thank you for allowing pharmacy to be a part of this patient's care.  Claybon Jabsngel, Yanelly Cantrelle G 09/12/2017 7:39 PM

## 2017-09-12 NOTE — Progress Notes (Signed)
Labor Progress Note Kayla [name] is a 26 y.o. G3P0110 at 2177w0d presented for IOL for lupus. S:  Patient comfortable with epidural. No more variables in last 30 minutes.  O:  BP 109/75   Pulse (!) 123 Comment: post albuterol breathing treatment   Temp 99.2 F (37.3 C) (Oral)   Resp 18   Ht 5\' 3"  (1.6 m)   Wt 186 lb (84.4 kg)   LMP 12/27/2016 (Exact Date)   SpO2 100%   BMI 32.95 kg/m   Fetal Tracing:  Baseline: 160bpm Variability: moderate Accels: 10x10 Decels: none  Toco: 5-6   CVE: Dilation: 6 Effacement (%): 80 Cervical Position: Middle Station: -1 Presentation: Vertex Exam by:: Ma Hillock. Jeriyah Granlund CNM   A&P: 26 y.o. G3P0110 2477w0d IOL for lupus #Labor: Progressing well. Pitocin restarted 1x1 #Pain: epidural #FWB: Cat 1 #GBS negative  Rolm Bookbinderaroline M Elisah Parmer, CNM 5:09 PM

## 2017-09-12 NOTE — Progress Notes (Signed)
Labor Progress Note Kayla [name] is a 26 y.o. G3P0110 at 7662w0d presented for IOL for lupus S:  Patient comfortable with epidural. CNM called to bedside for repeated deep variables.   O:  BP 108/61   Pulse 98   Temp 99.2 F (37.3 C) (Oral)   Resp 18   Ht 5\' 3"  (1.6 m)   Wt 186 lb (84.4 kg)   LMP 12/27/2016 (Exact Date)   SpO2 100%   BMI 32.95 kg/m   Fetal Tracing:  Baseline: 150 Variability: moderate Accels: 10x10 Decels: variable  Toco: 3-4   CVE: Dilation: 5.5 Effacement (%): 80 Station: -1 Presentation: Vertex Exam by:: Kayla [name], CNM `   A&P: 26 y.o. G3P0110 4962w0d IOL for lupus  Discussed with patient risks and benefits of IUPC and FSE placement. Patient agreeable to plan of care. FSE and IUPC placed without difficulty.  Pitocin stopped and position changed after repeated deep variables.  #Labor: Progressing well. Will restart pitocin after monitoring FHR #Pain: epidural #FWB: Cat 2 #GBS negative   Rolm Bookbinderaroline M Keymon Mcelroy, CNM 3:40 PM

## 2017-09-12 NOTE — Anesthesia Pain Management Evaluation Note (Signed)
  CRNA Pain Management Visit Note  Patient: Kayla [name], 26 y.o., female  "Hello [facility] am a [facility]. We have an anesthesia team available at all times to provide care throughout the hospital, including epidural management and anesthesia for C-section. I don't know your plan for the delivery whether it a natural birth, water birth, IV sedation, nitrous supplementation, doula or epidural, but we want to meet your pain goals."   1.Was your pain managed to your expectations on prior hospitalizations?   No prior hospitalizations  2.What is your expectation for pain management during this hospitalization?     Epidural  3.How can we help you reach that goal? Epidural when ready  Record the patient's initial score and the patient's pain goal.   Pain: 3  Pain Goal: 6 [facility] wants you to be able to say your pain was always managed very well.  Edison PaceWILKERSON,Valerian Jewel 09/12/2017

## 2017-09-12 NOTE — Progress Notes (Signed)
[name] Sava, female   DOB: 1991-07-18, 26 y.o.   MRN: [id number] Kayla [name] Nussbaumer is a 26 y.o. G3P0110 at 3730w0d admitted for induction of labor due to lupus and CHTN- no meds.  Subjective: Doing well, comfortable w/ epidural. No complaints  Objective: BP (!) 134/91   Pulse 96   Temp 99.8 F (37.7 C) (Oral)   Resp 18   Ht 5\' 3"  (1.6 m)   Wt 84.4 kg (186 lb)   LMP 12/27/2016 (Exact Date)   SpO2 100%   BMI 32.95 kg/m  No intake/output data recorded.  FHT:  FHR: 155 bpm, variability: moderate,  accelerations:  Present,  decelerations:  Present occ variable UC:   regular, every 3-4 minutes, mvu's 90/inadequate  SVE:   Dilation: 6 Effacement (%): 80 Station: -1 Exam by:: Ma Hillock. Neill CNM @ 1710  Pitocin @ 4 mu/min  Labs: Lab Results  Component Value Date   WBC 6.7 09/12/2017   HGB 12.0 09/12/2017   HCT 35.2 (L) 09/12/2017   MCV 81.7 09/12/2017   PLT 160 09/12/2017    Assessment / Plan: IOL d/t lupus, CHTN- no meds. Pitocin just restarted @ 8pm after break d/t variables. Pit currently at 634mu/min. MVUs 90/inadequate, SVE deferred until adequate. Has Triple I, being treated w/ amp/gent, has had apap. Temp 99.8  Labor: currently not inadequate Fetal Wellbeing:  Category II Pain Control:  Epidural Pre-eclampsia: n/a I/D:  Amp/gent for Triple I Anticipated MOD:  NSVD  Marge DuncansBooker, Elira Colasanti Randall CNM, WHNP-BC 09/12/2017, 9:56 PM

## 2017-09-12 NOTE — Progress Notes (Signed)
Kayla PonsLashonna [name] is a 26 y.o. G3P0110 at 5944w0d admitted for induction of labor due to lupus.  Subjective: Kayla [name] is sitting up in bed and resting comfortably. She is beginning to feel her contractions some. Otherwise, she has no complaints.  Objective: BP 130/76   Pulse 91   Temp 98.3 F (36.8 C) (Oral)   Resp 18   Ht 5\' 3"  (1.6 m)   Wt 186 lb (84.4 kg)   LMP 12/27/2016 (Exact Date)   SpO2 96%   BMI 32.95 kg/m  No intake/output data recorded. No intake/output data recorded.  FHT:  FHR: 135 bpm, variability: minimal ,  accelerations:  Present,  decelerations:  Present non-repetitive variables UC:   irregular, every 2-5 minutes SVE:   Dilation: 4 Effacement (%): 60 Station: -2 Exam by:: dr Karen Chafelockamy  Labs: Lab Results  Component Value Date   WBC 6.1 09/12/2017   HGB 13.0 09/12/2017   HCT 38.0 09/12/2017   MCV 81.2 09/12/2017   PLT 178 09/12/2017    Assessment / Plan: Induction of labor due to Lupus,  progressing well on pitocin  Labor: Progressing on Pitocin, will continue to increase then AROM Preeclampsia:  n/a Fetal Wellbeing:  Category II Pain Control:  Epidural I/D:  GBS negative Anticipated MOD:  NSVD  Arlyce Harmanimothy Ransome Helwig, DO PGY-1, [facility] 09/12/2017, 12:32 PM

## 2017-09-13 ENCOUNTER — Encounter (HOSPITAL_COMMUNITY): Payer: Self-pay

## 2017-09-13 DIAGNOSIS — Z3A37 37 weeks gestation of pregnancy: Secondary | ICD-10-CM

## 2017-09-13 DIAGNOSIS — O9952 Diseases of the respiratory system complicating childbirth: Secondary | ICD-10-CM

## 2017-09-13 DIAGNOSIS — O1092 Unspecified pre-existing hypertension complicating childbirth: Secondary | ICD-10-CM

## 2017-09-13 DIAGNOSIS — O9912 Other diseases of the blood and blood-forming organs and certain disorders involving the immune mechanism complicating childbirth: Secondary | ICD-10-CM

## 2017-09-13 DIAGNOSIS — D6862 Lupus anticoagulant syndrome: Secondary | ICD-10-CM

## 2017-09-13 LAB — CBC
HCT: 35.3 % — ABNORMAL LOW (ref 36.0–46.0)
HCT: 28.8 % — ABNORMAL LOW (ref 36.0–46.0)
HEMOGLOBIN: 12 g/dL (ref 12.0–15.0)
HEMOGLOBIN: 9.8 g/dL — AB (ref 12.0–15.0)
MCH: 28.1 pg (ref 26.0–34.0)
MCH: 28.3 pg (ref 26.0–34.0)
MCHC: 34 g/dL (ref 30.0–36.0)
MCHC: 34 g/dL (ref 30.0–36.0)
MCV: 82.7 fL (ref 78.0–100.0)
MCV: 83.2 fL (ref 78.0–100.0)
PLATELETS: 159 10*3/uL (ref 150–400)
Platelets: 150 10*3/uL (ref 150–400)
RBC: 4.27 MIL/uL (ref 3.87–5.11)
RBC: 3.46 MIL/uL — ABNORMAL LOW (ref 3.87–5.11)
RDW: 14.1 % (ref 11.5–15.5)
RDW: 14.2 % (ref 11.5–15.5)
WBC: 11 10*3/uL — ABNORMAL HIGH (ref 4.0–10.5)
WBC: 11.6 10*3/uL — ABNORMAL HIGH (ref 4.0–10.5)

## 2017-09-13 LAB — RUBELLA SCREEN: Rubella: 7.1 index (ref >0.99)

## 2017-09-13 IMAGING — US US OB TRANSVAGINAL
1 series · 15 of 28 positions shown · non-contrast
Comparison: 01/30/2017

CLINICAL DATA: Abdominal pain in first trimester pregnancy,
pregnancy of unknown anatomic location

EXAM:
TRANSVAGINAL OB ULTRASOUND
TECHNIQUE: Transvaginal ultrasound was performed for complete evaluation of the
gestation as well as the maternal uterus, adnexal regions, and
pelvic cul-de-sac.

[Series 1: us ob transvaginal · 15 of 33 slices shown]
[im 1/33]
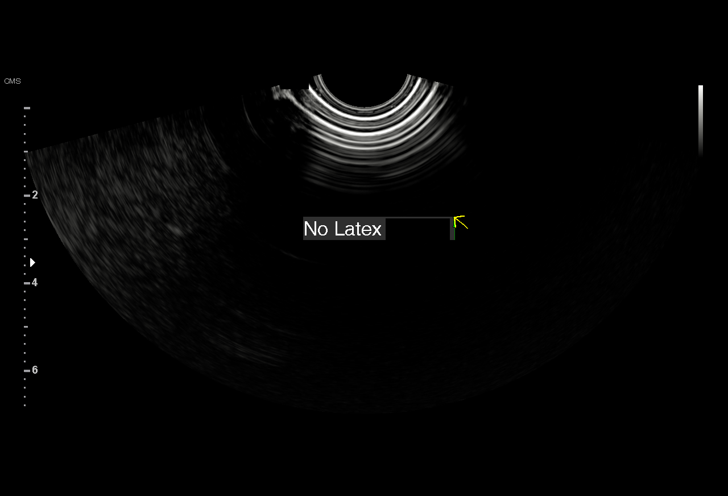
[im 3/33]
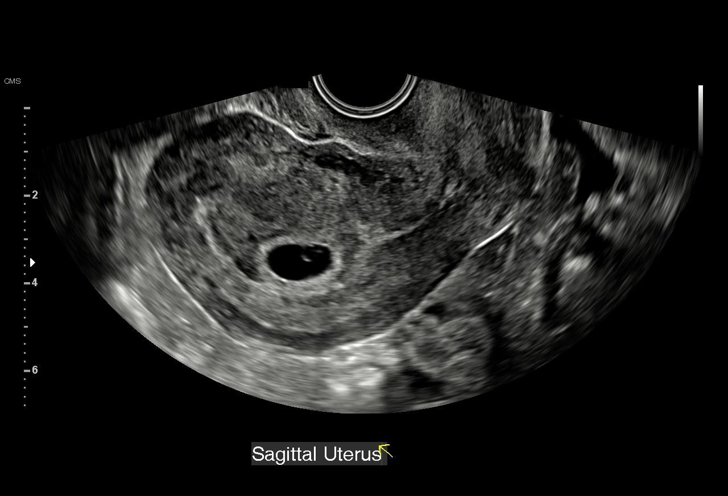
[im 5/33]
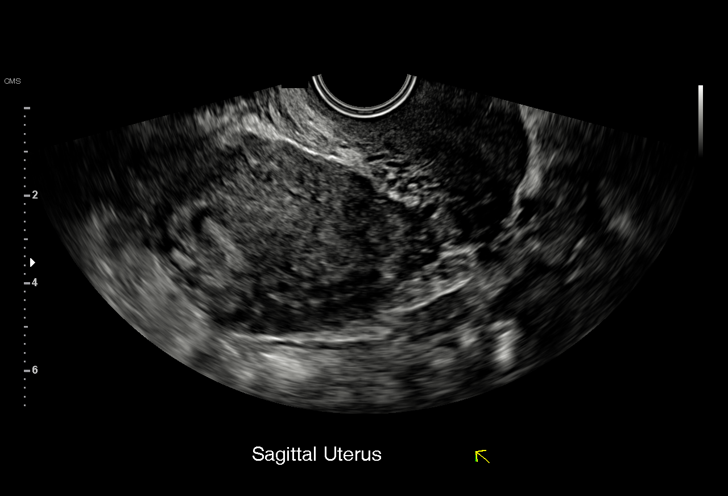
[im 8/33]
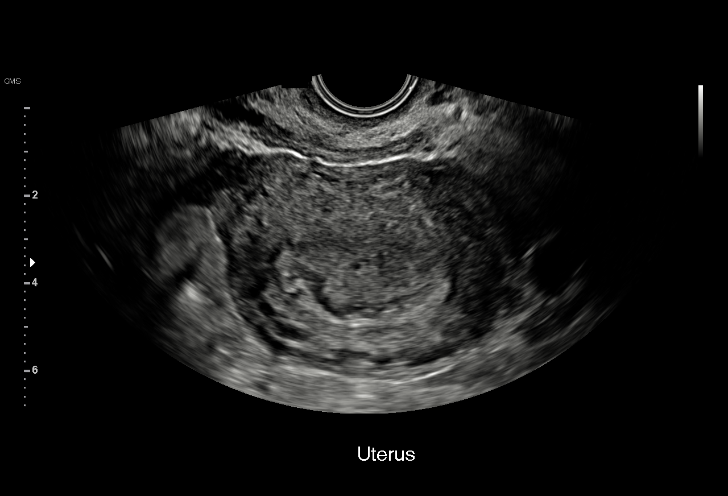
[im 10/33]
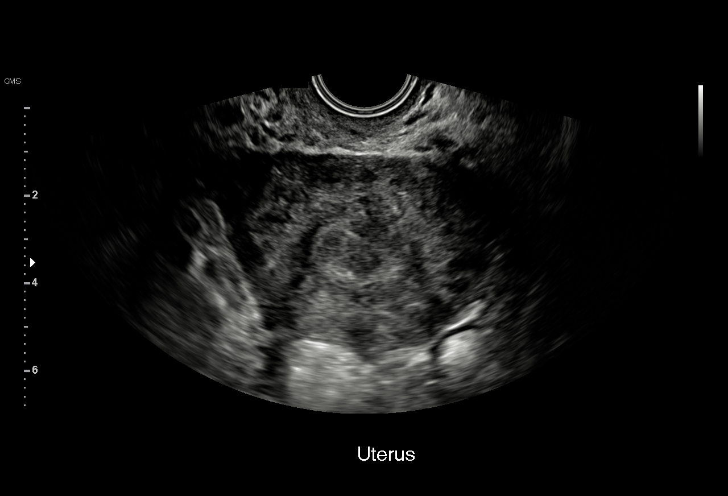
[im 12/33]
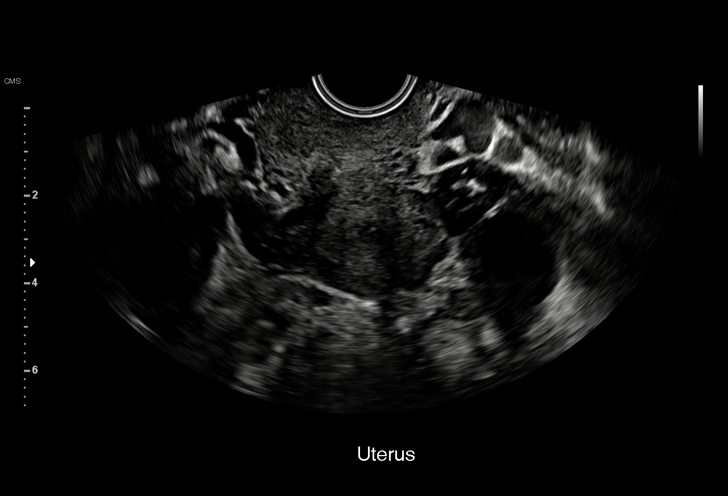
[im 15/33]
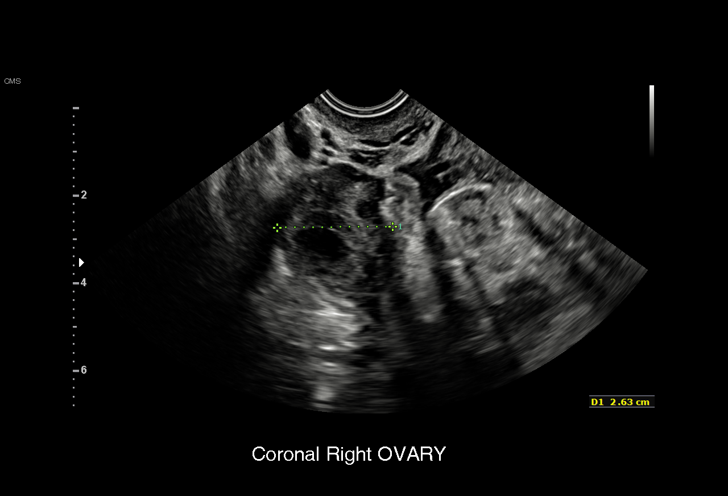
[im 17/33]
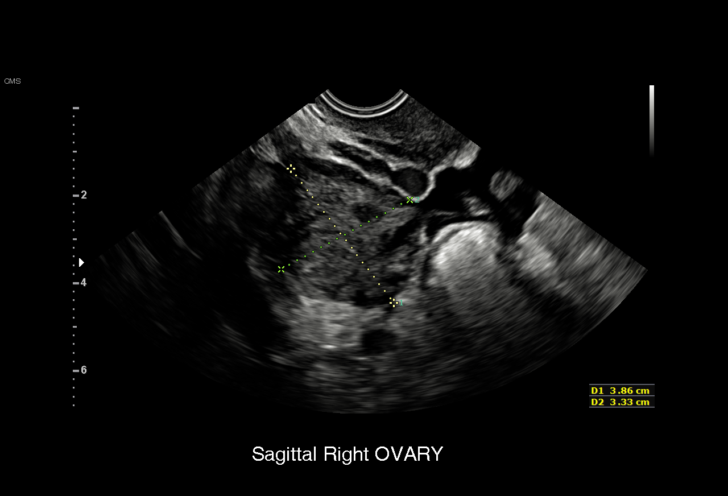
[im 18/33]
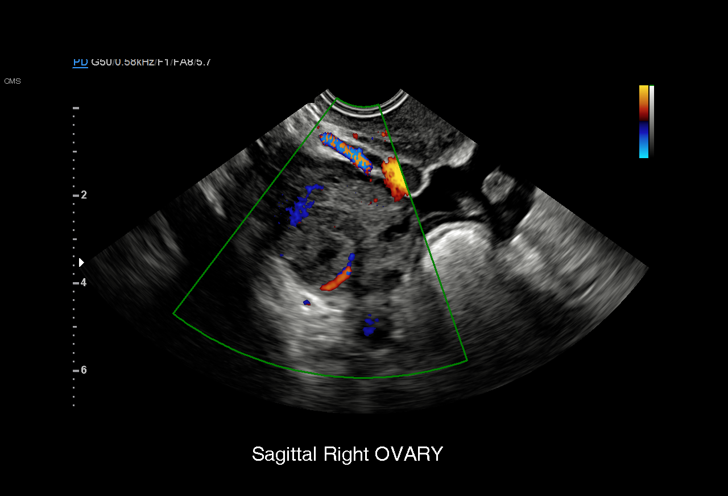
[im 21/33]
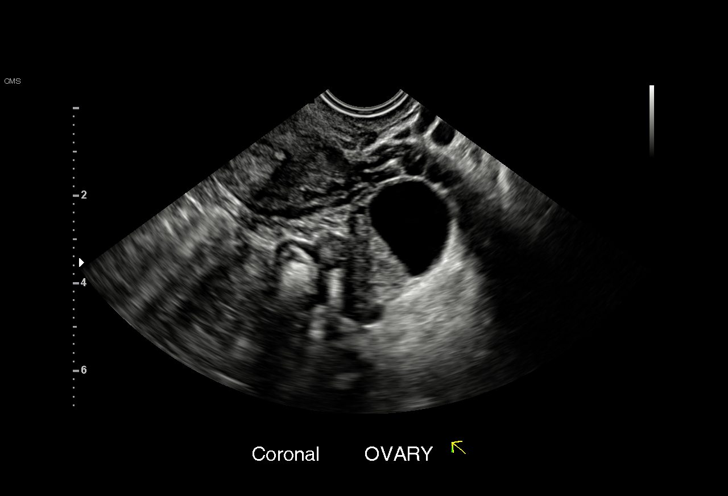
[im 23/33]
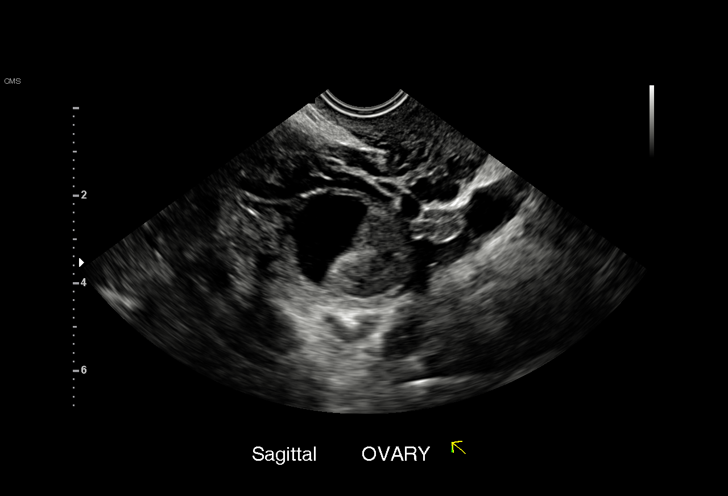
[im 25/33]
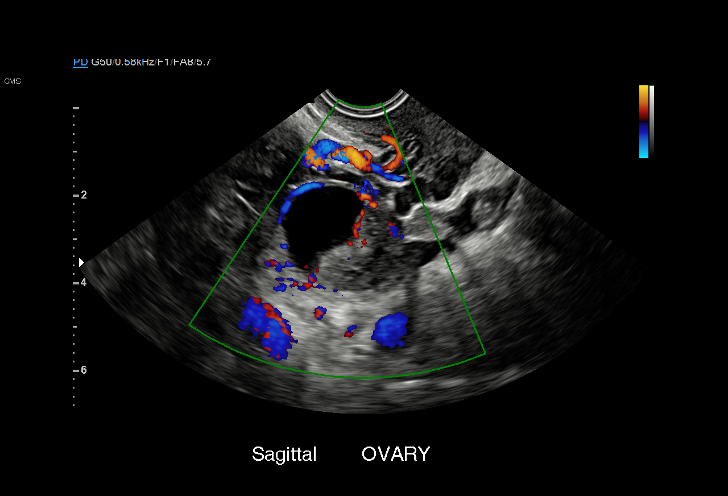
[im 28/33]
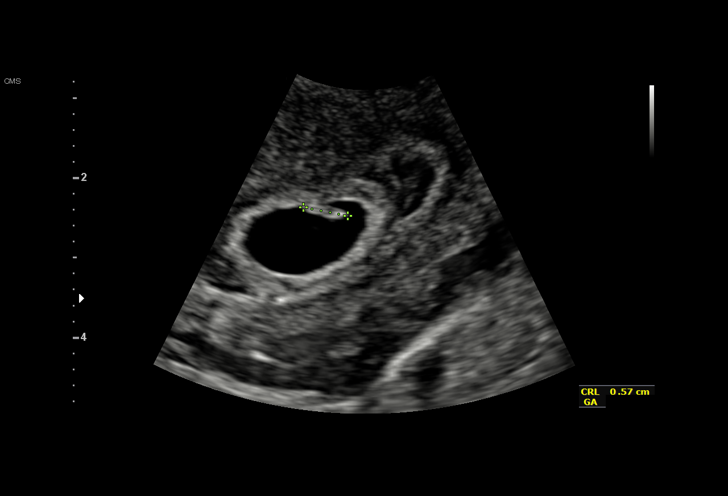
[im 30/33]
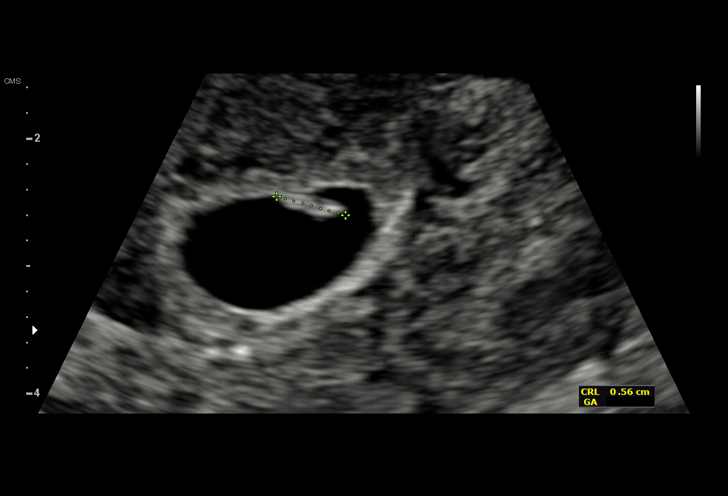
[im 33/33]
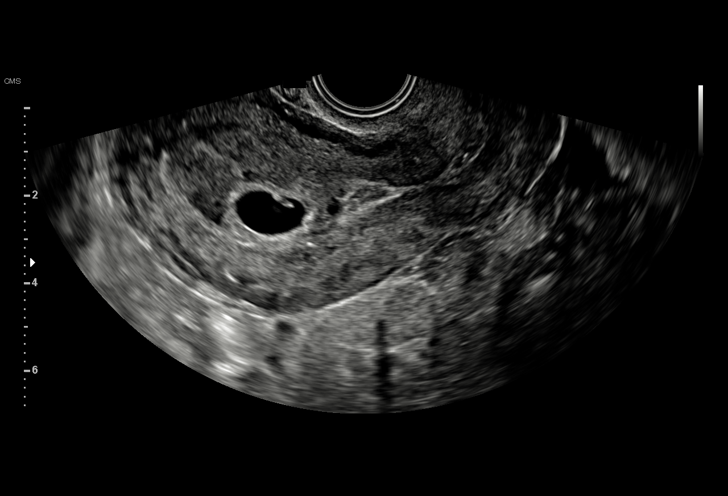

[15 of 28 positions shown; findings below may reference images not displayed]

FINDINGS: Intrauterine gestational sac: Present

Yolk sac:  Present

Embryo:  Present

Cardiac Activity: Present

Heart Rate: 109 bpm

CRL:   5.6  mm   6 w 2 d                  US EDC: 10/02/2017

Subchorionic hemorrhage:  None visualized.

Maternal uterus/adnexae:

LEFT ovary measures 3.7 x 2.7 x 2.6 cm and contains a corpus luteal
cyst.

RIGHT ovary normal size and morphology 3.9 x 3.3 x 2.6 cm.

Trace free pelvic fluid.

No adnexal masses.
IMPRESSION: Single live intrauterine gestation measured at 6 weeks 2 days EGA by
crown-rump length.

No other significant findings.

## 2017-09-13 MED ORDER — SODIUM CHLORIDE 0.9% FLUSH
3.0000 mL | Freq: Two times a day (BID) | INTRAVENOUS | Status: DC
  Administered 2017-09-14: 3 mL via INTRAVENOUS

## 2017-09-13 MED ORDER — SENNOSIDES-DOCUSATE SODIUM 8.6-50 MG PO TABS
2.0000 | ORAL_TABLET | ORAL | Status: DC
  Administered 2017-09-14 – 2017-09-15 (×2): 2 via ORAL
  Filled 2017-09-13 (×2): qty 2

## 2017-09-13 MED ORDER — SODIUM CHLORIDE 0.9% FLUSH: 3.0000 mL | INTRAVENOUS | Status: DC | PRN

## 2017-09-13 MED ORDER — WITCH HAZEL-GLYCERIN EX PADS: 1.0000 "application " | MEDICATED_PAD | CUTANEOUS | Status: DC | PRN

## 2017-09-13 MED ORDER — BENZOCAINE-MENTHOL 20-0.5 % EX AERO: 1.0000 "application " | INHALATION_SPRAY | CUTANEOUS | Status: DC | PRN

## 2017-09-13 MED ORDER — LACTATED RINGERS IV BOLUS (SEPSIS)
500.0000 mL | Freq: Once | INTRAVENOUS | Status: AC
  Administered 2017-09-13: 500 mL via INTRAVENOUS

## 2017-09-13 MED ORDER — TETANUS-DIPHTH-ACELL PERTUSSIS 5-2.5-18.5 LF-MCG/0.5 IM SUSP: 0.5000 mL | Freq: Once | INTRAMUSCULAR | Status: DC

## 2017-09-13 MED ORDER — ONDANSETRON HCL 4 MG PO TABS: 4.0000 mg | ORAL_TABLET | ORAL | Status: DC | PRN

## 2017-09-13 MED ORDER — OXYCODONE-ACETAMINOPHEN 5-325 MG PO TABS
1.0000 | ORAL_TABLET | ORAL | Status: DC | PRN
  Administered 2017-09-13 – 2017-09-15 (×4): 2 via ORAL
  Filled 2017-09-13 (×4): qty 2

## 2017-09-13 MED ORDER — OXYTOCIN 40 UNITS IN LACTATED RINGERS INFUSION - SIMPLE MED
INTRAVENOUS | Status: AC
  Filled 2017-09-13: qty 1000

## 2017-09-13 MED ORDER — LACTATED RINGERS IV SOLN: INTRAVENOUS | Status: DC

## 2017-09-13 MED ORDER — CYCLOBENZAPRINE HCL 5 MG PO TABS
5.0000 mg | ORAL_TABLET | Freq: Three times a day (TID) | ORAL | Status: DC | PRN
  Administered 2017-09-13: 5 mg via ORAL
  Filled 2017-09-13 (×2): qty 1

## 2017-09-13 MED ORDER — DIBUCAINE 1 % RE OINT: 1.0000 "application " | TOPICAL_OINTMENT | RECTAL | Status: DC | PRN

## 2017-09-13 MED ORDER — SODIUM CHLORIDE 0.9 % IV SOLN: 250.0000 mL | INTRAVENOUS | Status: DC | PRN

## 2017-09-13 MED ORDER — BUTALBITAL-APAP-CAFFEINE 50-325-40 MG PO TABS
1.0000 | ORAL_TABLET | Freq: Four times a day (QID) | ORAL | Status: DC | PRN
  Administered 2017-09-13 – 2017-09-14 (×2): 1 via ORAL
  Filled 2017-09-13 (×2): qty 1

## 2017-09-13 MED ORDER — ZOLPIDEM TARTRATE 5 MG PO TABS: 5.0000 mg | ORAL_TABLET | Freq: Every evening | ORAL | Status: DC | PRN

## 2017-09-13 MED ORDER — MISOPROSTOL 200 MCG PO TABS
ORAL_TABLET | ORAL | Status: AC
  Administered 2017-09-13: 1000 ug via RECTAL
  Filled 2017-09-13: qty 5

## 2017-09-13 MED ORDER — MISOPROSTOL 200 MCG PO TABS
1000.0000 ug | ORAL_TABLET | Freq: Once | ORAL | Status: AC
  Administered 2017-09-13: 1000 ug via RECTAL

## 2017-09-13 MED ORDER — SIMETHICONE 80 MG PO CHEW: 80.0000 mg | CHEWABLE_TABLET | ORAL | Status: DC | PRN

## 2017-09-13 MED ORDER — BISACODYL 10 MG RE SUPP: 10.0000 mg | Freq: Every day | RECTAL | Status: DC | PRN

## 2017-09-13 MED ORDER — ONDANSETRON HCL 4 MG/2ML IJ SOLN
4.0000 mg | INTRAMUSCULAR | Status: DC | PRN
  Administered 2017-09-13: 4 mg via INTRAVENOUS
  Filled 2017-09-13: qty 2

## 2017-09-13 MED ORDER — HYDROXYCHLOROQUINE SULFATE 200 MG PO TABS
200.0000 mg | ORAL_TABLET | Freq: Once | ORAL | Status: AC
  Administered 2017-09-13: 200 mg via ORAL
  Filled 2017-09-13: qty 1

## 2017-09-13 MED ORDER — ACETAMINOPHEN 325 MG PO TABS
650.0000 mg | ORAL_TABLET | ORAL | Status: DC | PRN
  Administered 2017-09-15: 650 mg via ORAL
  Filled 2017-09-13 (×3): qty 2

## 2017-09-13 MED ORDER — IBUPROFEN 600 MG PO TABS
600.0000 mg | ORAL_TABLET | Freq: Four times a day (QID) | ORAL | Status: DC
  Administered 2017-09-13 – 2017-09-15 (×10): 600 mg via ORAL
  Filled 2017-09-13 (×10): qty 1

## 2017-09-13 MED ORDER — PREDNISONE 10 MG PO TABS
10.0000 mg | ORAL_TABLET | Freq: Every day | ORAL | Status: DC
  Administered 2017-09-13 – 2017-09-15 (×3): 10 mg via ORAL
  Filled 2017-09-13 (×4): qty 1

## 2017-09-13 MED ORDER — PRENATAL MULTIVITAMIN CH
1.0000 | ORAL_TABLET | Freq: Every day | ORAL | Status: DC
  Administered 2017-09-13 – 2017-09-15 (×3): 1 via ORAL
  Filled 2017-09-13 (×3): qty 1

## 2017-09-13 MED ORDER — FLEET ENEMA 7-19 GM/118ML RE ENEM: 1.0000 | ENEMA | Freq: Every day | RECTAL | Status: DC | PRN

## 2017-09-13 MED ORDER — COCONUT OIL OIL: 1.0000 "application " | TOPICAL_OIL | Status: DC | PRN

## 2017-09-13 MED ORDER — HYDROXYCHLOROQUINE SULFATE 200 MG PO TABS
400.0000 mg | ORAL_TABLET | Freq: Every day | ORAL | Status: DC
  Administered 2017-09-14 – 2017-09-15 (×2): 400 mg via ORAL
  Filled 2017-09-13 (×3): qty 2

## 2017-09-13 MED ORDER — MEASLES, MUMPS & RUBELLA VAC ~~LOC~~ INJ
0.5000 mL | INJECTION | Freq: Once | SUBCUTANEOUS | Status: DC
  Filled 2017-09-13: qty 0.5

## 2017-09-13 MED ORDER — ALBUTEROL SULFATE (2.5 MG/3ML) 0.083% IN NEBU
3.0000 mL | INHALATION_SOLUTION | RESPIRATORY_TRACT | Status: DC | PRN
  Administered 2017-09-14: 3 mL via RESPIRATORY_TRACT
  Filled 2017-09-13: qty 3

## 2017-09-13 MED ORDER — HYDROXYCHLOROQUINE SULFATE 200 MG PO TABS
200.0000 mg | ORAL_TABLET | Freq: Two times a day (BID) | ORAL | Status: DC
  Administered 2017-09-13: 200 mg via ORAL
  Filled 2017-09-13: qty 1

## 2017-09-13 MED ORDER — FENTANYL CITRATE (PF) 100 MCG/2ML IJ SOLN
50.0000 ug | Freq: Once | INTRAMUSCULAR | Status: AC
  Administered 2017-09-13: 50 ug via INTRAVENOUS
  Filled 2017-09-13: qty 2

## 2017-09-13 MED ORDER — DIPHENHYDRAMINE HCL 25 MG PO CAPS: 25.0000 mg | ORAL_CAPSULE | Freq: Four times a day (QID) | ORAL | Status: DC | PRN

## 2017-09-13 NOTE — Progress Notes (Signed)
[name], female   DOB: Feb 09, 1991, 26 y.o.   MRN: [id number] Kayla [name] is a 26 y.o. G3P0110 at 656w1d admitted for induction of labor due to lupus, CHTN-no meds.  Subjective: Doing well, no complaints  Objective: BP (!) 138/97   Pulse (!) 108   Temp 99.7 F (37.6 C) (Axillary)   Resp 18   Ht 5\' 3"  (1.6 m)   Wt 84.4 kg (186 lb)   LMP 12/27/2016 (Exact Date)   SpO2 100%   BMI 32.95 kg/m  No intake/output data recorded.  FHT:  FHR: 160 bpm, variability: moderate,  accelerations:  Abscent,  decelerations:  Present variables UC:   regular, every 3-4 minutes  SVE:   9/100/+1 Pitocin was @ 4 mu/min, turned off by RN d/t decels  Labs: Lab Results  Component Value Date   WBC 6.7 09/12/2017   HGB 12.0 09/12/2017   HCT 35.2 (L) 09/12/2017   MCV 81.7 09/12/2017   PLT 160 09/12/2017    Assessment / Plan: IOL d/t lupus, CHTN- no meds, pitocin off d/t variables, pt now 9cm, will leave pitocin off for now  Labor: Progressing normally Fetal Wellbeing:  Category II Pain Control:  Epidural Pre-eclampsia: n/a I/D:  n/a Anticipated MOD:  NSVD  Marge DuncansBooker, Yenny Kosa Randall CNM, [facility] 09/12/16 @ [phone]

## 2017-09-13 NOTE — Progress Notes (Signed)
Delivery Note Kayla [name] is a 26 y.o. G3P0110 at [redacted]w[redacted]d admitted for IOL for lupus, cHTN, and interstitial lung disease, h/o IUFD at 35 wks.  Labor course: admitted for IOL on 09/12/17, pitocin with AROM At 0126 a viable and healthy baby girl was delivered via spontaneous vaginal delivery (Presentation: ; LOA).  Loose double nuchal with true knot, reduced after delivery. Infant placed directly on mom's abdomen for bonding/skin-to-skin. Delayed cord clamping x 1min, then cord clamped x 2, and cut by family member.  APGAR: 8,9 ; weight: pending at time of note.  40 units of pitocin diluted in 1000cc LR was infused rapidly IV per protocol. The placenta separated spontaneously and delivered via CCT and maternal pushing effort.  It was inspected and appears to be intact with a 3 VC.  Placenta/Cord with the following complications: scattered calcifications .  Cord pH: not done.  Intrapartum complications:  Chorio treated with amp and gent Anesthesia:  epidural Episiotomy: none Lacerations:  none Suture Repair: N/A Est. Blood Loss (mL): 300mL Sponge and instrument count were correct x2.  Mom to postpartum.  Baby to Couplet care / Skin to Skin. Placenta to path. Plans to breast feed Contraception: Depo Provera Circ: N/A  Kayla MansonRebecca Renessa Wellnitz PA-S 09/13/2017 2:06 AM

## 2017-09-13 NOTE — Progress Notes (Signed)
Bleeding has slowed with no clots except for 1 small one when up to bathroom to void. No dizziness or lightheadedness when up. No pain since fentanyl given. Sleepy

## 2017-09-13 NOTE — Progress Notes (Signed)
[name], female   DOB: 1990-12-10, 26 y.o.   MRN: [id number] Called by RN @ [phone] for pt bleeding  S: feeling ok, mild lightheadedness, denies cp, sob  O: VSS HR 90s, BP normal (not filed in VS as of yet)      Fundus boggy, firmed some w/ massage, still having some oozing.  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 PRBC transfusion if needed  Cheral MarkerKimberly R. Balin Vandegrift, CNM, WHNP-BC 09/13/2017 0730

## 2017-09-13 NOTE — Anesthesia Postprocedure Evaluation (Signed)
Anesthesia Post Note  Patient: Kayla [name]  Procedure(s) Performed: AN AD HOC LABOR EPIDURAL     Patient location during evaluation: Mother Baby Anesthesia Type: Epidural Level of consciousness: awake and alert Pain management: pain level controlled Vital Signs Assessment: post-procedure vital signs reviewed and stable Respiratory status: spontaneous breathing, nonlabored ventilation and respiratory function stable Cardiovascular status: stable Postop Assessment: no headache, no backache and epidural receding Anesthetic complications: no    Last Vitals:  Vitals:   09/13/17 0312 09/13/17 0429  BP: 140/84 124/75  Pulse: (!) 109 (!) 108  Resp: 18 18  Temp: (!) 38.7 C 37.1 C  SpO2:      Last Pain:  Vitals:   09/13/17 0429  TempSrc: Oral  PainSc:    Pain Goal: Patients Stated Pain Goal: 3 (09/12/17 1930)               Mckaila Duffus

## 2017-09-13 NOTE — Progress Notes (Signed)
Took pt to bathroom at 0630. She had a medium size blood clot. Blood loss 120. Pt peed and was firm and midline. During shift report pt said she felt she passed a clot. Called Joellyn HaffKim Booker, CNM to evaluate pt. She ordered 1000 cytotec and 500mL bolus of pitocin. Total blood loss is 587 + 300 from delivery + 120 from first blood clot. So 1007 total. CBC ordered.

## 2017-09-13 NOTE — Lactation Note (Signed)
This note was copied from a baby's chart. Lactation Consultation Note New mom, baby 3 hrs old. Mom has large heavy pendulous breast w/flat nipples. Edema to lower breast w/areola and nipple. Reverse pressure to areola w/little results. Hand expression w/no colostrum noted.  Fitted mom w/#16 NS. Taught application. NS is at lower aspect of breast, NS will stay on if applied w/suction.  Baby suckling on top lip. Has a long tongue. Extends well. Latched w/o difficulty. No transfer noted in NS.  Reviewed supplementing w/amount to be given according to hours of age.  Shells given to wear in bra, hand pump given to pre-pump before application of NS. DEBP for post pumping for supplementing and stimulation.  RN shown mom how to use DEBP & how to disassemble, clean, & reassemble parts. Mom knows to pump q3h for 15-20 min. Early newborn behavior discussed, STS, I&O cluster feeding and supply and demand educated.  Mom has Lupus. On several medications: reviewed w/mom, gave her information on each medication from "Medications Mother's Milk".  [facility]/LC brochure given w/resources, support groups and LC services. Patient Name: Kayla [name] BJYNW'GToday's Date: 09/13/2017 Reason for consult: Initial assessment;Early term 37-38.6wks;Infant < 6lbs   Maternal Data Has patient been taught Hand Expression?: Yes Does the patient have breastfeeding experience prior to this delivery?: No  Feeding Feeding Type: Breast Fed Nipple Type: Slow - flow Length of feed: 10 min  LATCH Score Latch: Grasps breast easily, tongue down, lips flanged, rhythmical sucking.  Audible Swallowing: None  Type of Nipple: Flat  Comfort (Breast/Nipple): Soft / non-tender  Hold (Positioning): Assistance needed to correctly position infant at breast and maintain latch.  LATCH Score: 6  Interventions Interventions: Breast feeding basics reviewed;Breast compression;Assisted with latch;Adjust position;Hand pump;Skin to skin;Support  pillows;DEBP;Breast massage;Position options;Hand express;Pre-pump if needed;Shells;Reverse pressure  Lactation Tools Discussed/Used Tools: Shells;Pump;Nipple Dorris CarnesShields;Bottle Nipple shield size: 16 Shell Type: Inverted Breast pump type: Double-Electric Breast Pump;Manual   Consult Status Consult Status: Follow-up Date: 09/13/17 Follow-up type: In-patient    Kayla DancerCARVER, Kayla [name] 09/13/2017, 4:33 AM

## 2017-09-13 NOTE — Anesthesia Postprocedure Evaluation (Signed)
Anesthesia Post Note  Patient: Kayla [name]  Procedure(s) Performed: AN AD HOC LABOR EPIDURAL     Patient location during evaluation: Mother Baby Anesthesia Type: Epidural Level of consciousness: awake, awake and alert and oriented Pain management: pain level controlled Vital Signs Assessment: post-procedure vital signs reviewed and stable Respiratory status: spontaneous breathing Cardiovascular status: blood pressure returned to baseline Postop Assessment: no headache, no backache, epidural receding, patient able to bend at knees, no apparent nausea or vomiting and adequate PO intake Anesthetic complications: no    Last Vitals:  Vitals:   09/13/17 0312 09/13/17 0429  BP: 140/84 124/75  Pulse: (!) 109 (!) 108  Resp: 18 18  Temp: (!) 38.7 C 37.1 C  SpO2:      Last Pain:  Vitals:   09/13/17 0752  TempSrc:   PainSc: 7    Pain Goal: Patients Stated Pain Goal: 3 (09/12/17 1930)               Cleda ClarksBrowder, Macsen Nuttall R

## 2017-09-14 MED ORDER — OXYCODONE HCL 5 MG PO TABS
10.0000 mg | ORAL_TABLET | Freq: Four times a day (QID) | ORAL | Status: DC | PRN
  Administered 2017-09-14 – 2017-09-15 (×2): 10 mg via ORAL
  Filled 2017-09-14 (×2): qty 2

## 2017-09-14 MED ORDER — AZATHIOPRINE 50 MG PO TABS
50.0000 mg | ORAL_TABLET | Freq: Three times a day (TID) | ORAL | Status: DC
  Administered 2017-09-14 – 2017-09-15 (×2): 50 mg via ORAL
  Filled 2017-09-14 (×5): qty 1

## 2017-09-14 MED ORDER — BUTALBITAL-APAP-CAFFEINE 50-325-40 MG PO TABS: 2.0000 | ORAL_TABLET | Freq: Four times a day (QID) | ORAL | Status: DC | PRN

## 2017-09-14 MED ORDER — PANTOPRAZOLE SODIUM 20 MG PO TBEC
20.0000 mg | DELAYED_RELEASE_TABLET | Freq: Every day | ORAL | Status: DC
  Administered 2017-09-14 – 2017-09-15 (×2): 20 mg via ORAL
  Filled 2017-09-14 (×3): qty 1

## 2017-09-14 NOTE — Lactation Note (Signed)
This note was copied from a baby's chart. Lactation Consultation Note  Patient Name: Kayla [name] Kayla [name]: 09/14/2017 Reason for consult: Follow-up assessment (baby in the nusery and has all bottles , due to mom not feeling well per ,mom )  Mom has been sleeping most of the after noon after a breathing treatment.  Mom awake and mentioned to this LC she had a head ache and feeling weak. [facility].  LC discussed information regarding her Lupus medication she was on the 1st trimester, and has not been on it since.  Medication name - Azathioprino - trade name - Imuran. L3, and print out  information given to mom and LC relayed the message To mom that the [facility] had spoken to Dr. Kennedy BuckerGrant regarding medication and it was ok with breast feeding.  Mom mentioned the abby had latched early and now she doesn't seem interested.  LC reviewed potential feeding behaviors with a early term infant, and LC recommended trying giving her abby a small portion of EBM or formula  From the bottle since she has gotten used to it, and then trying latching. Continue the consistent pumping at least 8 x's a day to enhance the milk coming  [facility] reassured mom with these early babies it can be a slower process.  Stressed the 1st 2 weeks being the most important weeks of breast feeding and in order for the milk volume to increase consistent stimulation is needed.     Maternal Data    Feeding Feeding Type: Bottle Fed - Formula Nipple Type: Slow - flow  LATCH Score                   Interventions Interventions: Breast feeding basics reviewed  Lactation Tools Discussed/Used     Consult Status Consult Status: Follow-up [name]: 09/15/17 Follow-up type: In-patient    [name] 09/14/2017, 7:24 PM

## 2017-09-14 NOTE — Progress Notes (Signed)
Offered patient an albuterol treatment, she stated she doesn't need one at this time.

## 2017-09-14 NOTE — Progress Notes (Signed)
Patient asked for dr to come see her, she has not felt well today. Rolita CMW notified.

## 2017-09-14 NOTE — Progress Notes (Signed)
Post Partum Day 1 Subjective: up ad lib, voiding, tolerating PO, + flatus and complains of mild nausea controlled with zofran. Patient states she has noticed swelling in bilateral lower extremities this morning.  Objective: Blood pressure 106/70, pulse 72, temperature 97.6 F (36.4 C), temperature source Oral, resp. rate 20, height 5\' 3"  (1.6 m), weight 84.4 kg (186 lb), last menstrual period 12/27/2016, SpO2 100 %, unknown if currently breastfeeding.  Physical Exam:  General: alert and cooperative Lochia: appropriate, patient has not passed any clots since yesterday Uterine Fundus: firm Incision: N/A DVT Evaluation: No cords or calf tenderness. No significant calf/ankle edema.   Recent Labs  09/13/17 0202 09/13/17 0803  HGB 12.0 9.8*  HCT 35.3* 28.8*    Assessment/Plan: Plan for discharge tomorrow and Contraception method: Depo Provera, bottle feeding   LOS: 2 days   Julieanne Mansonebecca Brayden Betters, PA-S 09/14/2017, 7:27 AM

## 2017-09-15 MED ORDER — AZATHIOPRINE 50 MG PO TABS: 150.0000 mg | ORAL_TABLET | Freq: Every day | ORAL | 0 refills | Status: DC

## 2017-09-15 MED ORDER — SENNOSIDES-DOCUSATE SODIUM 8.6-50 MG PO TABS: 2.0000 | ORAL_TABLET | ORAL | 0 refills | Status: DC

## 2017-09-15 MED ORDER — IBUPROFEN 600 MG PO TABS: 600.0000 mg | ORAL_TABLET | Freq: Four times a day (QID) | ORAL | 0 refills | Status: DC

## 2017-09-15 NOTE — Lactation Note (Signed)
This note was copied from a baby's chart. Lactation Consultation Note  Patient Name: [name] WUJWJ'XToday's Date: 09/15/2017 Reason for consult: Follow-up assessment   Follow up with mom of 57 hour old infant. Infant is formula feeding via bottle. Mom reports she is feeling fuller today, she tried to pump this morning with very little flow from the breast. Enc mom to pump every 2-3 hours to stimulate milk production and to prevent engorgement. Mom is feeling fuller without s/s engorgement.   Mom is awaiting pump from insurance company. [facility] Mom is to call the Peer Counselor to see if she can get in to get a DEBP today. Mom has been ill and had difficulty pumping. She is being d/c home today. Mom was informed of how to pump with Double manual pump. Mom was informed of WIC pump loaner program and is to call out if wants a pump before going home.   Offered to assist mom with latching infant to the breast, mom declined. Reviewed I/O, Engorgement prevention/treatment and Breast milk handling and storage. [facility] Brochure given, mom informed of OP services, BF Support Groups and LC phone #. Mom reports she has no further questions/concerns at this time.    Maternal Data Formula Feeding for Exclusion: Yes Reason for exclusion: Mother's choice to formula and breast feed on admission Has patient been taught Hand Expression?: Yes Does the patient have breastfeeding experience prior to this delivery?: No  Feeding Feeding Type: Bottle Fed - Formula  LATCH Score                   Interventions    Lactation Tools Discussed/Used WIC Program: Yes Pump Review: Setup, frequency, and cleaning;Milk Storage Initiated by:: [name], RN, IBCLC   Consult Status Consult Status: Complete Follow-up type: Call as needed    Ed BlalockSharon S Jaylon Boylen 09/15/2017, 11:20 AM

## 2017-09-15 NOTE — Discharge Summary (Signed)
OB Discharge Summary     Patient Name: Kayla [name] DOB: 07-Jan-1991 MRN: [id number]  Date of admission: 09/12/2017 Delivering MD: Shawna Clamp R   Date of discharge: 09/15/2017  Admitting diagnosis: INDUCTION Intrauterine pregnancy: [redacted]w[redacted]d     Secondary diagnosis:  Active Problems:   Chronic hypertension in pregnancy  Additional problems:  - SLE: on plaquenil and prednisone - Sjogren's disease - Interstitial lung disease: report intermittent O2 use at home; albuterol prn - CHTN: not on meds - Hx of IUFD     Discharge diagnosis: Term Pregnancy Delivered and CHTN                                                                                                Post partum procedures:none  Augmentation: AROM and Pitocin  Complications: Intrauterine Inflammation or infection (Chorioamniotis) and Hemorrhage>1050mL  Hospital course:  Induction of Labor With Vaginal Delivery   26 y.o. yo [id number] at [redacted]w[redacted]d was admitted to the hospital 09/12/2017 for induction of labor.  Indication for induction: High risk pregnancy due to SLE, Sjogren's disease, Interstitial lung disease, cHTN no meds and a history of IUFD. Patient started on Pit due to favorable cx. AROM was performed later on. She was diagnosed with Triple I and was treated with IV antibiotics (Ampicillin and Gentamicin).  Membrane Rupture Time/Date: 1:44 PM ,09/12/2017   Intrapartum Procedures: Episiotomy: None [1]                                         Lacerations:  None [1]  Patient had delivery of a Viable infant.  Information for the patient's newborn:  Kayla, [name] [[id number]]  Delivery Method: Vaginal, Spontaneous Delivery (Filed from Delivery Summary)   09/13/2017  Details of delivery can be found in separate delivery note. She was evaluated for increased bleeding a few hours PP and was given IV Pit and cytotec PR with good results. She was restarted on her home azathioprine post partum.  BPs remained stable  PP. Patient was discharged with indication for post partum follow up in 4 weeks as well as communicate with her Pulmonologist and Rheumatologist of her delivery.  Patient is discharged home 09/15/17.   Physical exam  Vitals:   09/14/17 0315 09/14/17 0621 09/14/17 1819 09/14/17 1843  BP: 116/78 106/70 120/76   Pulse: 90 72 96   Resp: 20 20 18    Temp: 98.2 F (36.8 C) 97.6 F (36.4 C) 98.8 F (37.1 C)   TempSrc: Oral Oral Oral   SpO2:    97%  Weight:      Height:       General: alert, cooperative and no distress Lochia: appropriate Uterine Fundus: firm Incision: N/A DVT Evaluation: No evidence of DVT seen on physical exam. Labs: Lab Results  Component Value Date   WBC 11.6 (H) 09/13/2017   HGB 9.8 (L) 09/13/2017   HCT 28.8 (L) 09/13/2017   MCV 83.2 09/13/2017   PLT 150 09/13/2017   CMP Latest Ref Rng & Units  09/06/2017  Glucose 65 - 99 mg/dL 99  BUN 6 - 20 mg/dL 8  Creatinine 0.98 - 1.19 mg/dL 1.47  Sodium 829 - 562 mmol/L 136  Potassium 3.5 - 5.1 mmol/L 3.8  Chloride 101 - 111 mmol/L 105  CO2 22 - 32 mmol/L 21(L)  Calcium 8.9 - 10.3 mg/dL 9.3  Total Protein 6.5 - 8.1 g/dL 7.3  Total Bilirubin 0.3 - 1.2 mg/dL 1.3(Y)  Alkaline Phos 38 - 126 U/L 101  AST 15 - 41 U/L 21  ALT 14 - 54 U/L 13(L)    Discharge instruction: per After Visit Summary and "Baby and Me Booklet".  After visit meds:  Allergies as of 09/15/2017      Reactions   Hydrocodone Nausea And Vomiting   Zithromax [azithromycin] Itching, Cough      Medication List    STOP taking these medications   acetaminophen 500 MG tablet Commonly known as:  TYLENOL   ranitidine 150 MG tablet Commonly known as:  ZANTAC     TAKE these medications   albuterol 108 (90 Base) MCG/ACT inhaler Commonly known as:  PROVENTIL HFA;VENTOLIN HFA Inhale 2 puffs into the lungs every 4 (four) hours as needed for wheezing or shortness of breath.   aspirin EC 81 MG tablet Take 1 tablet (81 mg total) by mouth daily.    azaTHIOprine 50 MG tablet Commonly known as:  IMURAN Take 3 tablets (150 mg total) by mouth daily.   butalbital-acetaminophen-caffeine 50-325-40 MG tablet Commonly known as:  FIORICET, ESGIC Take 1 tablet by mouth every 6 (six) hours as needed for headache.   fluticasone 50 MCG/ACT nasal spray Commonly known as:  FLONASE Place 2 sprays into both nostrils daily. What changed:  when to take this  reasons to take this   folic acid 1 MG tablet Commonly known as:  FOLVITE Take 1 tablet (1 mg total) by mouth daily.   hydroxychloroquine 200 MG tablet Commonly known as:  PLAQUENIL Take 400 mg by mouth daily.   ibuprofen 600 MG tablet Commonly known as:  ADVIL,MOTRIN Take 1 tablet (600 mg total) by mouth every 6 (six) hours.   pantoprazole 40 MG tablet Commonly known as:  PROTONIX Take 1 tablet (40 mg total) by mouth daily.   predniSONE 5 MG tablet Commonly known as:  DELTASONE Take 10 mg by mouth daily with breakfast.   PRENATAL COMPLETE 14-0.4 MG Tabs Take 1 tablet by mouth daily.   senna-docusate 8.6-50 MG tablet Commonly known as:  Senokot-S Take 2 tablets by mouth daily.       Diet: routine diet  Activity: Advance as tolerated. Pelvic rest for 6 weeks.   Outpatient follow up:4 weeks; pt will communicate with her Rheumatologist re plan after delivery Follow up Appt:No future appointments. Follow up Visit:No Follow-up on file.  Postpartum contraception: Depo Provera  Newborn Data: Live born female  Birth Weight: 5 lb 13.7 oz (2655 g) APGAR: 8, 9  Newborn Delivery   Birth date/time:  09/13/2017 01:26:00 Delivery type:  Vaginal, Spontaneous Delivery      Baby Feeding: Bottle Disposition:home with mother   09/15/2017 Suella Broad, MD  CNM attestation I have seen and examined this patient and agree with above documentation in the resident's note.   Kayla [name] is a 26 y.o. [id number] s/p SVD after IOL for SLE.   Pain is well controlled.  Plan  for birth control is Depo-Provera.  Method of Feeding: bottle  PE:  BP 119/84   Pulse 91  Temp 98.8 F (37.1 C) (Oral)   Resp 18   Ht 5\' 3"  (1.6 m)   Wt 84.4 kg (186 lb)   LMP 12/27/2016 (Exact Date)   SpO2 97%   Breastfeeding? Unknown   BMI 32.95 kg/m  Fundus firm  No results for input(s): HGB, HCT in the last 72 hours.   Plan: discharge today - postpartum care discussed - f/u clinic in 4 weeks for postpartum visit; to call Rheumatologist and inform of delivery   SHAW, Cala BradfordKIMBERLY, CNM 10:36 AM

## 2017-09-15 NOTE — Discharge Instructions (Signed)

## 2017-09-18 ENCOUNTER — Inpatient Hospital Stay (HOSPITAL_COMMUNITY)
Admission: AD | Admit: 2017-09-18 | Discharge: 2017-09-21 | DRG: 776 | Disposition: A | Discharge status: Other Acute Inpatient Hospital | Payer: Medicare Other | Source: Ambulatory Visit | Attending: Internal Medicine | Admitting: Internal Medicine

## 2017-09-18 ENCOUNTER — Encounter (HOSPITAL_COMMUNITY): Payer: Self-pay

## 2017-09-18 ENCOUNTER — Inpatient Hospital Stay (HOSPITAL_COMMUNITY): Payer: Medicare Other

## 2017-09-18 DIAGNOSIS — E875 Hyperkalemia: Secondary | ICD-10-CM | POA: Diagnosis not present

## 2017-09-18 DIAGNOSIS — I272 Pulmonary hypertension, unspecified: Secondary | ICD-10-CM | POA: Diagnosis present

## 2017-09-18 DIAGNOSIS — T4275XA Adverse effect of unspecified antiepileptic and sedative-hypnotic drugs, initial encounter: Secondary | ICD-10-CM | POA: Diagnosis not present

## 2017-09-18 DIAGNOSIS — J8489 Other specified interstitial pulmonary diseases: Secondary | ICD-10-CM

## 2017-09-18 DIAGNOSIS — G43909 Migraine, unspecified, not intractable, without status migrainosus: Secondary | ICD-10-CM | POA: Diagnosis present

## 2017-09-18 DIAGNOSIS — I10 Essential (primary) hypertension: Secondary | ICD-10-CM | POA: Diagnosis present

## 2017-09-18 DIAGNOSIS — J9601 Acute respiratory failure with hypoxia: Secondary | ICD-10-CM | POA: Diagnosis present

## 2017-09-18 DIAGNOSIS — J189 Pneumonia, unspecified organism: Secondary | ICD-10-CM

## 2017-09-18 DIAGNOSIS — Z87891 Personal history of nicotine dependence: Secondary | ICD-10-CM

## 2017-09-18 DIAGNOSIS — D649 Anemia, unspecified: Secondary | ICD-10-CM | POA: Diagnosis present

## 2017-09-18 DIAGNOSIS — J810 Acute pulmonary edema: Secondary | ICD-10-CM | POA: Diagnosis present

## 2017-09-18 DIAGNOSIS — R0602 Shortness of breath: Secondary | ICD-10-CM | POA: Diagnosis not present

## 2017-09-18 DIAGNOSIS — O909 Complication of the puerperium, unspecified: Principal | ICD-10-CM | POA: Diagnosis present

## 2017-09-18 DIAGNOSIS — Z9289 Personal history of other medical treatment: Secondary | ICD-10-CM

## 2017-09-18 DIAGNOSIS — G92 Toxic encephalopathy: Secondary | ICD-10-CM | POA: Diagnosis not present

## 2017-09-18 DIAGNOSIS — Z79899 Other long term (current) drug therapy: Secondary | ICD-10-CM

## 2017-09-18 DIAGNOSIS — Z7952 Long term (current) use of systemic steroids: Secondary | ICD-10-CM

## 2017-09-18 DIAGNOSIS — E876 Hypokalemia: Secondary | ICD-10-CM | POA: Diagnosis present

## 2017-09-18 DIAGNOSIS — J849 Interstitial pulmonary disease, unspecified: Secondary | ICD-10-CM

## 2017-09-18 DIAGNOSIS — J9602 Acute respiratory failure with hypercapnia: Secondary | ICD-10-CM | POA: Diagnosis present

## 2017-09-18 DIAGNOSIS — Z885 Allergy status to narcotic agent status: Secondary | ICD-10-CM

## 2017-09-18 DIAGNOSIS — M3500 Sicca syndrome, unspecified: Secondary | ICD-10-CM | POA: Diagnosis present

## 2017-09-18 DIAGNOSIS — Y95 Nosocomial condition: Secondary | ICD-10-CM | POA: Diagnosis present

## 2017-09-18 DIAGNOSIS — Z9981 Dependence on supplemental oxygen: Secondary | ICD-10-CM

## 2017-09-18 DIAGNOSIS — K219 Gastro-esophageal reflux disease without esophagitis: Secondary | ICD-10-CM | POA: Diagnosis present

## 2017-09-18 DIAGNOSIS — Z7982 Long term (current) use of aspirin: Secondary | ICD-10-CM

## 2017-09-18 DIAGNOSIS — M329 Systemic lupus erythematosus, unspecified: Secondary | ICD-10-CM | POA: Diagnosis present

## 2017-09-18 DIAGNOSIS — J9690 Respiratory failure, unspecified, unspecified whether with hypoxia or hypercapnia: Secondary | ICD-10-CM

## 2017-09-18 DIAGNOSIS — Z881 Allergy status to other antibiotic agents status: Secondary | ICD-10-CM

## 2017-09-18 LAB — URINALYSIS, ROUTINE W REFLEX MICROSCOPIC
Bacteria, UA: NONE SEEN
Bilirubin Urine: NEGATIVE
GLUCOSE, UA: NEGATIVE mg/dL
HGB URINE DIPSTICK: NEGATIVE
KETONES UR: NEGATIVE mg/dL
NITRITE: NEGATIVE
PH: 6 (ref 5.0–8.0)
Protein, ur: NEGATIVE mg/dL
Specific Gravity, Urine: 1.014 (ref 1.005–1.030)

## 2017-09-18 LAB — TROPONIN I: TROPONIN I: 0.03 ng/mL — AB (ref <0.03)

## 2017-09-18 MED ORDER — ASPIRIN 81 MG PO CHEW
CHEWABLE_TABLET | ORAL | Status: AC
  Administered 2017-09-18: 324 mg via ORAL
  Filled 2017-09-18: qty 4

## 2017-09-18 MED ORDER — HYDRALAZINE HCL 20 MG/ML IJ SOLN: 5.0000 mg | INTRAMUSCULAR | Status: DC | PRN

## 2017-09-18 MED ORDER — NITROGLYCERIN 0.4 MG SL SUBL
0.4000 mg | SUBLINGUAL_TABLET | SUBLINGUAL | Status: DC | PRN
  Administered 2017-09-18: 0.4 mg via SUBLINGUAL
  Filled 2017-09-18: qty 1

## 2017-09-18 MED ORDER — IOPAMIDOL (ISOVUE-370) INJECTION 76%
100.0000 mL | Freq: Once | INTRAVENOUS | Status: AC | PRN
  Administered 2017-09-18: 100 mL via INTRAVENOUS

## 2017-09-18 MED ORDER — ASPIRIN 81 MG PO CHEW
324.0000 mg | CHEWABLE_TABLET | Freq: Once | ORAL | Status: AC
  Administered 2017-09-18: 324 mg via ORAL

## 2017-09-18 MED ORDER — SODIUM CHLORIDE 0.9 % IV SOLN
999.0000 mL | INTRAVENOUS | Status: DC
  Administered 2017-09-19: 999 mL via INTRAVENOUS

## 2017-09-18 MED ORDER — LABETALOL HCL 5 MG/ML IV SOLN: 20.0000 mg | INTRAVENOUS | Status: DC | PRN

## 2017-09-18 NOTE — MAU Note (Signed)
Pt presents to MAU with c/o shortness of breath and severe migraine that started since 6 am this morning. Pt has also had chest pain and back pain today. Pt has had vaginal bleeding today.  Pt delivered on 09/13/2017.

## 2017-09-18 NOTE — MAU Note (Addendum)
CRITICAL VALUE STICKER  CRITICAL VALUE: Troponin .03  RECEIVER (on-site recipient of call): Judithe ModestBridget Deyton Ellenbecker  DATE & TIME NOTIFIED: 2255  MESSENGER (representative from lab):  MD NOTIFIED: [place]  TIME OF NOTIFICATION: 2255  RESPONSE: In communication with cardiologist

## 2017-09-18 NOTE — MAU Provider Note (Signed)
Chief Complaint: Shortness of Breath; Chest Pain; and Migraine   None     SUBJECTIVE HPI: Kayla [name] is a 26 y.o. [id number] on PPD # 5 following NSVD with medical hx significant for Lupus, CHTN-no meds, interstitial lund disease, sjorgens who presents to maternity admissions reporting sudden onset substernal chest pain and shortness of breath this morning at 6 am, worsening over the course of the day. The pain is in the center of her chest, feels like pressure but with a stabbing component also, especially when taking a deep breath.  She has never had pain like this before.  She used her inhaler and a nebulizer treatment at home which did not improve either her pain or her SOB.  She does report recent cough and nasal drainage x 2 days but denies sick contacts, fever, or sore throat.  The pain is associated with severe frontal h/a with photophobia and nausea without vomiting, similar to pt previous migraine headaches. She reports light lochia, denies abdominal pain, urinary symptoms,  n/v, or fever/chills.     HPI  Past Medical History:  Diagnosis Date  . Arthritis    "hands and legs" (01/08/2015)  . CAP (community acquired pneumonia) 01/07/2015  . Daily headache    "sometimes" (01/08/2015)  . GERD (gastroesophageal reflux disease)   . Hypertension   . Lung disease   . Lupus   . Pulmonary hypertension (HCC)   . Sjogren's syndrome [facility])    Past Surgical History:  Procedure Laterality Date  . CARDIAC CATHETERIZATION N/A 09/09/2015   Procedure: Right Heart Cath;  Surgeon: Laurey Morale, MD;  Location: [facility] INVASIVE CV LAB;  Service: Cardiovascular;  Laterality: N/A;  . DILATION AND EVACUATION N/A 07/13/2015   Procedure: DILATATION AND EVACUATION;  Surgeon: Levie Heritage, DO;  Location: [facility] ORS;  Service: Gynecology;  Laterality: N/A;  . FINGER SURGERY Right 03/2014   "laceration, nerve/artery injury" 2nd digit  . VIDEO BRONCHOSCOPY Bilateral 01/12/2015   Procedure: VIDEO BRONCHOSCOPY WITH  FLUORO;  Surgeon: Leslye Peer, MD;  Location: [facility] ENDOSCOPY;  Service: Cardiopulmonary;  Laterality: Bilateral;   Social History   Social History  . Marital status: Single    Spouse name: N/A  . Number of children: N/A  . Years of education: N/A   Occupational History  . [employer]      Workers Compensation   Social History Main Topics  . Smoking status: Former Smoker    Packs/day: 0.10    Years: 5.00    Types: Cigarettes    Quit date: 11/20/2014  . Smokeless tobacco: Never Used  . Alcohol use No  . Drug use: No  . Sexual activity: Not Currently   Other Topics Concern  . Not on file   Social History Narrative  . No narrative on file   No current facility-administered medications on file prior to encounter.    Current Outpatient Prescriptions on File Prior to Encounter  Medication Sig Dispense Refill  . albuterol (PROVENTIL HFA;VENTOLIN HFA) 108 (90 Base) MCG/ACT inhaler Inhale 2 puffs into the lungs every 4 (four) hours as needed for wheezing or shortness of breath. 1 Inhaler 5  . aspirin EC 81 MG tablet Take 1 tablet (81 mg total) by mouth daily. 30 tablet 2  . azaTHIOprine (IMURAN) 50 MG tablet Take 3 tablets (150 mg total) by mouth daily. 90 tablet 0  . butalbital-acetaminophen-caffeine (FIORICET, ESGIC) 50-325-40 MG tablet Take 1 tablet by mouth every 6 (six) hours as needed for headache. 20 tablet  0  . folic acid (FOLVITE) 1 MG tablet Take 1 tablet (1 mg total) by mouth daily. 100 tablet 5  . hydroxychloroquine (PLAQUENIL) 200 MG tablet Take 400 mg by mouth daily.    Marland Kitchen ibuprofen (ADVIL,MOTRIN) 600 MG tablet Take 1 tablet (600 mg total) by mouth every 6 (six) hours. 30 tablet 0  . pantoprazole (PROTONIX) 40 MG tablet Take 1 tablet (40 mg total) by mouth daily. 30 tablet 0  . predniSONE (DELTASONE) 5 MG tablet Take 10 mg by mouth daily with breakfast.     . Prenatal Vit-Fe Fumarate-FA (PRENATAL COMPLETE) 14-0.4 MG TABS Take 1 tablet by mouth daily. 30 each 0  .  fluticasone (FLONASE) 50 MCG/ACT nasal spray Place 2 sprays into both nostrils daily. (Patient taking differently: Place 2 sprays into both nostrils daily as needed for rhinitis. ) 16 g 2  . senna-docusate (SENOKOT-S) 8.6-50 MG tablet Take 2 tablets by mouth daily. 60 tablet 0   Allergies  Allergen Reactions  . Hydrocodone Nausea And Vomiting  . Zithromax [Azithromycin] Itching and Cough    ROS:  Review of Systems  Constitutional: Negative for chills, fatigue and fever.  HENT: Positive for rhinorrhea. Negative for sore throat.   Eyes: Positive for photophobia.  Respiratory: Positive for cough, chest tightness and shortness of breath.   Cardiovascular: Positive for chest pain.  Gastrointestinal: Positive for nausea. Negative for abdominal pain and vomiting.  Genitourinary: Negative for difficulty urinating, dysuria, flank pain, pelvic pain, vaginal bleeding, vaginal discharge and vaginal pain.  Musculoskeletal: Positive for neck pain.  Neurological: Positive for headaches. Negative for dizziness.  Psychiatric/Behavioral: Negative.      I have reviewed patient's Past Medical Hx, Surgical Hx, Family Hx, Social Hx, medications and allergies.   Physical Exam   Patient Vitals for the past 24 hrs:  BP Temp Temp src Pulse Resp SpO2  09/19/17 0115 (!) 144/97 - - (!) 105 (!) 32 95 %  09/19/17 0111 (!) 143/99 99.4 F (37.4 C) Oral (!) 111 (!) 34 95 %  09/19/17 0029 (!) 158/92 98.3 F (36.8 C) Oral (!) 112 - 97 %  09/19/17 0022 - - - - - 97 %  09/19/17 0020 - - - - - 92 %  09/19/17 0017 (!) 146/88 - - 96 - -  09/19/17 0015 - - - - - 96 %  09/19/17 0010 - - - - - 96 %  09/19/17 0005 - - - - - 97 %  09/18/17 2352 - - - - - 95 %  09/18/17 2347 (!) 150/82 - - 96 - 97 %  09/18/17 2341 - - - - - 97 %  09/18/17 2333 (!) 154/93 - - 93 - -  09/18/17 2243 - - - - - 99 %  09/18/17 2238 - - - - - 97 %  09/18/17 2233 - - - - - 98 %  09/18/17 2228 - - - - - 97 %  09/18/17 2223 - - - - - 94 %   09/18/17 2218 - - - - - 97 %  09/18/17 2213 - - - - - 99 %  09/18/17 2208 - - - - - 98 %  09/18/17 2203 - - - - - 97 %  09/18/17 2158 - - - - - 99 %  09/18/17 2154 (!) 156/87 - - (!) 118 - -  09/18/17 2153 - - - - - (!) 82 %  09/18/17 2148 - - - - - (!) 88 %  09/18/17 2143 - - - - - (!) 85 %  09/18/17 2142 - - - - - (!) 83 %  09/18/17 2124 (!) 150/93 99.3 F (37.4 C) Oral 96 20 92 %   Constitutional: Well-developed, well-nourished female in significant distress.  HEART: tachycardia, normal heart sounds, regular rhythm RESP: Crackles/rales in all lobes bilaterally GI: Abd soft, non-tender. Pos BS x 4 MS: Extremities nontender, no edema, normal ROM Neurologic: Alert and oriented x 4.  GU: Neg CVAT.  PELVIC EXAM: Deferred   LAB RESULTS Results for orders placed or performed during the hospital encounter of 09/18/17 (from the past 24 hour(s))  Urinalysis, Routine w reflex microscopic     Status: Abnormal   Collection Time: 09/18/17  9:21 PM  Result Value Ref Range   Color, Urine YELLOW YELLOW   APPearance CLEAR CLEAR   Specific Gravity, Urine 1.014 1.005 - 1.030   pH 6.0 5.0 - 8.0   Glucose, UA NEGATIVE NEGATIVE mg/dL   Hgb urine dipstick NEGATIVE NEGATIVE   Bilirubin Urine NEGATIVE NEGATIVE   Ketones, ur NEGATIVE NEGATIVE mg/dL   Protein, ur NEGATIVE NEGATIVE mg/dL   Nitrite NEGATIVE NEGATIVE   Leukocytes, UA SMALL (A) NEGATIVE   RBC / HPF 0-5 0 - 5 RBC/hpf   WBC, UA 6-30 0 - 5 WBC/hpf   Bacteria, UA NONE SEEN NONE SEEN   Squamous Epithelial / LPF 0-5 (A) NONE SEEN   Mucus PRESENT   Protein / creatinine ratio, urine     Status: Abnormal   Collection Time: 09/18/17  9:21 PM  Result Value Ref Range   Creatinine, Urine 88.00 mg/dL   Total Protein, Urine 25 mg/dL   Protein Creatinine Ratio 0.28 (H) 0.00 - 0.15 mg/mg[Cre]  Troponin I     Status: Abnormal   Collection Time: 09/18/17 10:05 PM  Result Value Ref Range   Troponin I 0.03 (HH) <0.03 ng/mL  Brain natriuretic  peptide     Status: Abnormal   Collection Time: 09/18/17 10:10 PM  Result Value Ref Range   B Natriuretic Peptide 416.1 (H) 0.0 - 100.0 pg/mL  Comprehensive metabolic panel     Status: Abnormal   Collection Time: 09/18/17 10:10 PM  Result Value Ref Range   Sodium 141 135 - 145 mmol/L   Potassium 3.3 (L) 3.5 - 5.1 mmol/L   Chloride 110 101 - 111 mmol/L   CO2 24 22 - 32 mmol/L   Glucose, Bld 84 65 - 99 mg/dL   BUN 8 6 - 20 mg/dL   Creatinine, Ser 4.090.58 0.44 - 1.00 mg/dL   Calcium 8.4 (L) 8.9 - 10.3 mg/dL   Total Protein 6.5 6.5 - 8.1 g/dL   Albumin 3.2 (L) 3.5 - 5.0 g/dL   AST 29 15 - 41 U/L   ALT 23 14 - 54 U/L   Alkaline Phosphatase 91 38 - 126 U/L   Total Bilirubin 0.5 0.3 - 1.2 mg/dL   GFR calc non Af Amer >60 >60 mL/min   GFR calc Af Amer >60 >60 mL/min   Anion gap 7 5 - 15    --/--/O POS (10/23 0701)  IMAGING Ct Angio Chest Pe W Or Wo Contrast  Result Date: 09/18/2017 CLINICAL DATA:  26 year old female with chest pain. Concern for pulmonary embolism. EXAM: CT ANGIOGRAPHY CHEST WITH CONTRAST TECHNIQUE: Multidetector CT imaging of the chest was performed using the standard protocol during bolus administration of intravenous contrast. Multiplanar CT image reconstructions and MIPs were obtained to evaluate the vascular anatomy.  CONTRAST:  100 cc Isovue 370 COMPARISON:  Chest radiograph dated 07/27/2017 FINDINGS: Cardiovascular: There is no cardiomegaly or pericardial effusion. The thoracic aorta is unremarkable. The origins of the great vessels of the aortic arch are patent as visualized. Evaluation of the pulmonary arteries is limited due to respiratory motion artifact and suboptimal visualization of the peripheral branches. No pulmonary artery embolus identified. There is mild dilatation of the main pulmonary trunk suggestive of underlying pulmonary hypertension. Mediastinum/Nodes: There are mildly enlarged bilateral hilar lymph nodes. There is a small hiatal hernia. The esophagus is  grossly unremarkable. Lungs/Pleura: There are patchy areas of upper lobe predominant ground-glass density with associated interstitial fibrosis and parenchymal consolidative changes. Multiple subpleural cystic changes noted. There is overall increased degree of patchy airspace ground-glass density compared to the prior CT compatible with progression of known interstitial lung disease. There is scattered lower lobe predominant ground-glass nodular densities most consistent with pneumonia, possibly of an atypical etiology. There is associated interstitial edema. There are small bilateral pleural effusions, new from prior CT. There is no pneumothorax. Upper Abdomen: No acute abnormality. Musculoskeletal: No chest wall abnormality. No acute or significant osseous findings. Review of the MIP images confirms the above findings. IMPRESSION: 1. No CT evidence of pulmonary embolism. 2. Findings most consistent with pneumonia on a background of interstitial lung disease. There has been interval progression of the underlying interstitial lung disease compared to the CT of 2016. Small bilateral pleural effusions noted. 3. Mild dilatation of the main pulmonary trunk suggestive of underlying pulmonary hypertension. These results were called by telephone at the time of interpretation on 09/18/2017 at 11:45 pm to nurse practitioner. Charleen Madera LEFTWICH-KIRBY , who verbally acknowledged these results. Electronically Signed   By: Elgie Collard M.D.   On: 09/18/2017 23:54    MAU Management/MDM: Pt presents with acute onset chest pain, SOB, and HTN with headache. O2 sats were 80% upon arrival in MAU so partial nonrebreather mask applied with O2 at 5L and pt O2 sats rose to 94-98%.  O2 changed to nasal cannula at pt request at 6L and pt O2 saturation remained >90%.  Preeclampsia labs ordered (CBC, CMP, UA, P/C ratio) and chest pain/PE labs added with troponin, BNP. MAU CP protocol started with chewable ASA tablets and Nitroglycerin.   Consult Dr Macon Large to discuss assessment and findings.  CT scan to rule out PE ordered at [facility].  CT results c/w CAP superimposed on preexisting lung disease.  Abnormal troponin and EKG but unchanged from pt previous. Preeclampsia labs wnl.  Consult Dr Macon Large.  Transfer to [facility] for further cardiac/pulmonary evaluation.  Unlikely preeclampsia with normal labs and BP elevated but not in severe range and none requiring treatment.  Dr Juleen China accepting MD.  Pt transported by EMS because [facility] unavailable.  Pt with some worsening onf symptoms with pink frothy sputum prior to transfer but O2 sats >90% and chest pain unchanged.  Pt transferred to [facility].  ASSESSMENT Acute onset chest pain Shortness of breath Postpartum complication Community acquired pneumonia Lupus Sjogrens Interstitial lung disease Pulmonary HTN  PLAN Transfer to [facility] Certified Nurse-Midwife 09/19/2017  2:22 AM

## 2017-09-18 NOTE — MAU Note (Signed)
While pt was down in radiology for CT scan, after getting onto table and lying down. Pt became short of breath, hot and was coughing. Pulse ox was placed on pt's finger and was reading 85 with oxygen running at 6L/min. Pt sat up and oxygen was turned up to 10L via non rebreather. Pts pulse ox quickly came up the 97 and remained stable during CT scan.  After getting pt back to unit oxygen level has been consistent at 97 with oxygen running at 6 L/min via nasal cannula.

## 2017-09-19 ENCOUNTER — Inpatient Hospital Stay (HOSPITAL_COMMUNITY): Payer: Medicare Other

## 2017-09-19 ENCOUNTER — Encounter (HOSPITAL_COMMUNITY): Payer: Self-pay | Admitting: Emergency Medicine

## 2017-09-19 DIAGNOSIS — J189 Pneumonia, unspecified organism: Secondary | ICD-10-CM

## 2017-09-19 DIAGNOSIS — Z881 Allergy status to other antibiotic agents status: Secondary | ICD-10-CM | POA: Diagnosis not present

## 2017-09-19 DIAGNOSIS — Y95 Nosocomial condition: Secondary | ICD-10-CM | POA: Diagnosis present

## 2017-09-19 DIAGNOSIS — T4275XA Adverse effect of unspecified antiepileptic and sedative-hypnotic drugs, initial encounter: Secondary | ICD-10-CM | POA: Diagnosis not present

## 2017-09-19 DIAGNOSIS — K219 Gastro-esophageal reflux disease without esophagitis: Secondary | ICD-10-CM | POA: Diagnosis present

## 2017-09-19 DIAGNOSIS — E875 Hyperkalemia: Secondary | ICD-10-CM | POA: Diagnosis not present

## 2017-09-19 DIAGNOSIS — M3502 Sicca syndrome with lung involvement: Secondary | ICD-10-CM | POA: Diagnosis not present

## 2017-09-19 DIAGNOSIS — D649 Anemia, unspecified: Secondary | ICD-10-CM | POA: Diagnosis present

## 2017-09-19 DIAGNOSIS — E876 Hypokalemia: Secondary | ICD-10-CM | POA: Diagnosis present

## 2017-09-19 DIAGNOSIS — Z87891 Personal history of nicotine dependence: Secondary | ICD-10-CM | POA: Diagnosis not present

## 2017-09-19 DIAGNOSIS — J8489 Other specified interstitial pulmonary diseases: Secondary | ICD-10-CM

## 2017-09-19 DIAGNOSIS — R0602 Shortness of breath: Secondary | ICD-10-CM | POA: Diagnosis present

## 2017-09-19 DIAGNOSIS — I10 Essential (primary) hypertension: Secondary | ICD-10-CM | POA: Diagnosis present

## 2017-09-19 DIAGNOSIS — J849 Interstitial pulmonary disease, unspecified: Secondary | ICD-10-CM

## 2017-09-19 DIAGNOSIS — Z885 Allergy status to narcotic agent status: Secondary | ICD-10-CM | POA: Diagnosis not present

## 2017-09-19 DIAGNOSIS — M3500 Sicca syndrome, unspecified: Secondary | ICD-10-CM | POA: Diagnosis present

## 2017-09-19 DIAGNOSIS — M329 Systemic lupus erythematosus, unspecified: Secondary | ICD-10-CM | POA: Diagnosis not present

## 2017-09-19 DIAGNOSIS — O909 Complication of the puerperium, unspecified: Secondary | ICD-10-CM | POA: Diagnosis present

## 2017-09-19 DIAGNOSIS — I272 Pulmonary hypertension, unspecified: Secondary | ICD-10-CM | POA: Diagnosis present

## 2017-09-19 DIAGNOSIS — J9602 Acute respiratory failure with hypercapnia: Secondary | ICD-10-CM | POA: Diagnosis present

## 2017-09-19 DIAGNOSIS — G9340 Encephalopathy, unspecified: Secondary | ICD-10-CM | POA: Diagnosis not present

## 2017-09-19 DIAGNOSIS — J9601 Acute respiratory failure with hypoxia: Secondary | ICD-10-CM | POA: Diagnosis present

## 2017-09-19 DIAGNOSIS — Z79899 Other long term (current) drug therapy: Secondary | ICD-10-CM | POA: Diagnosis not present

## 2017-09-19 DIAGNOSIS — G43909 Migraine, unspecified, not intractable, without status migrainosus: Secondary | ICD-10-CM | POA: Diagnosis present

## 2017-09-19 DIAGNOSIS — G92 Toxic encephalopathy: Secondary | ICD-10-CM | POA: Diagnosis not present

## 2017-09-19 DIAGNOSIS — Z7982 Long term (current) use of aspirin: Secondary | ICD-10-CM | POA: Diagnosis not present

## 2017-09-19 DIAGNOSIS — J810 Acute pulmonary edema: Secondary | ICD-10-CM | POA: Diagnosis not present

## 2017-09-19 DIAGNOSIS — Z7952 Long term (current) use of systemic steroids: Secondary | ICD-10-CM | POA: Diagnosis not present

## 2017-09-19 DIAGNOSIS — Z9981 Dependence on supplemental oxygen: Secondary | ICD-10-CM | POA: Diagnosis not present

## 2017-09-19 HISTORY — DX: Acute respiratory failure with hypoxia: J96.01

## 2017-09-19 LAB — RENAL FUNCTION PANEL
Albumin: 2.9 g/dL — ABNORMAL LOW (ref 3.5–5.0)
Anion gap: 8 (ref 5–15)
BUN: 5 mg/dL — AB (ref 6–20)
CO2: 24 mmol/L (ref 22–32)
CREATININE: 0.56 mg/dL (ref 0.44–1.00)
Calcium: 7.6 mg/dL — ABNORMAL LOW (ref 8.9–10.3)
Chloride: 107 mmol/L (ref 101–111)
GFR calc Af Amer: >60 mL/min (ref >60)
Glucose, Bld: 112 mg/dL — ABNORMAL HIGH (ref 65–99)
POTASSIUM: 4 mmol/L (ref 3.5–5.1)
Phosphorus: 4.6 mg/dL (ref 2.5–4.6)
Sodium: 139 mmol/L (ref 135–145)

## 2017-09-19 LAB — CBC WITH DIFFERENTIAL/PLATELET
BASOS ABS: 0 10*3/uL (ref 0.0–0.1)
Basophils Relative: 0 %
EOS PCT: 0 %
Eosinophils Absolute: 0 10*3/uL (ref 0.0–0.7)
HEMATOCRIT: 30.1 % — AB (ref 36.0–46.0)
Hemoglobin: 9.3 g/dL — ABNORMAL LOW (ref 12.0–15.0)
LYMPHS ABS: 0.6 10*3/uL — AB (ref 0.7–4.0)
LYMPHS PCT: 3 %
MCH: 27 pg (ref 26.0–34.0)
MCHC: 30.9 g/dL (ref 30.0–36.0)
MCV: 87.5 fL (ref 78.0–100.0)
Monocytes Absolute: 0.3 10*3/uL (ref 0.1–1.0)
Monocytes Relative: 2 %
NEUTROS PCT: 95 %
Neutro Abs: 17.9 10*3/uL — ABNORMAL HIGH (ref 1.7–7.7)
PLATELETS: 286 10*3/uL (ref 150–400)
RBC: 3.44 MIL/uL — ABNORMAL LOW (ref 3.87–5.11)
RDW: 14.5 % (ref 11.5–15.5)
WBC: 18.9 10*3/uL — AB (ref 4.0–10.5)

## 2017-09-19 LAB — POCT I-STAT 3, ART BLOOD GAS (G3+)
ACID-BASE EXCESS: 7 mmol/L — AB (ref 0.0–2.0)
ACID-BASE EXCESS: 2 mmol/L (ref 0.0–2.0)
Bicarbonate: 32 mmol/L — ABNORMAL HIGH (ref 20.0–28.0)
Bicarbonate: 29 mmol/L — ABNORMAL HIGH (ref 20.0–28.0)
O2 SAT: 100 %
O2 SAT: 92 %
Patient temperature: 99.1
TCO2: 33 mmol/L — ABNORMAL HIGH (ref 22–32)
TCO2: 31 mmol/L (ref 22–32)
pCO2 arterial: 49.7 mmHg — ABNORMAL HIGH (ref 32.0–48.0)
pCO2 arterial: 54.9 mmHg — ABNORMAL HIGH (ref 32.0–48.0)
pH, Arterial: 7.417 (ref 7.350–7.450)
pH, Arterial: 7.332 — ABNORMAL LOW (ref 7.350–7.450)
pO2, Arterial: 225 mmHg — ABNORMAL HIGH (ref 83.0–108.0)
pO2, Arterial: 72 mmHg — ABNORMAL LOW (ref 83.0–108.0)

## 2017-09-19 LAB — BLOOD GAS, ARTERIAL
ACID-BASE DEFICIT: 0.7 mmol/L (ref 0.0–2.0)
Bicarbonate: 26 mmol/L (ref 20.0–28.0)
DRAWN BY: 51133
FIO2: 100
MECHVT: 400 mL
O2 Saturation: 98.6 %
PEEP/CPAP: 5 cmH2O
Patient temperature: 99.9
RATE: 20 resp/min
pCO2 arterial: 66.8 mmHg (ref 32.0–48.0)
pH, Arterial: 7.219 — ABNORMAL LOW (ref 7.350–7.450)
pO2, Arterial: 166 mmHg — ABNORMAL HIGH (ref 83.0–108.0)

## 2017-09-19 LAB — PROTEIN / CREATININE RATIO, URINE
CREATININE, URINE: 88 mg/dL
Protein Creatinine Ratio: 0.28 mg/mg{Cre} — ABNORMAL HIGH (ref 0.00–0.15)
TOTAL PROTEIN, URINE: 25 mg/dL

## 2017-09-19 LAB — COMPREHENSIVE METABOLIC PANEL
ALT: 23 U/L (ref 14–54)
AST: 29 U/L (ref 15–41)
Albumin: 3.2 g/dL — ABNORMAL LOW (ref 3.5–5.0)
Alkaline Phosphatase: 91 U/L (ref 38–126)
Anion gap: 7 (ref 5–15)
BILIRUBIN TOTAL: 0.5 mg/dL (ref 0.3–1.2)
BUN: 8 mg/dL (ref 6–20)
CALCIUM: 8.4 mg/dL — AB (ref 8.9–10.3)
CO2: 24 mmol/L (ref 22–32)
CREATININE: 0.58 mg/dL (ref 0.44–1.00)
Chloride: 110 mmol/L (ref 101–111)
Glucose, Bld: 84 mg/dL (ref 65–99)
Potassium: 3.3 mmol/L — ABNORMAL LOW (ref 3.5–5.1)
Sodium: 141 mmol/L (ref 135–145)
TOTAL PROTEIN: 6.5 g/dL (ref 6.5–8.1)

## 2017-09-19 LAB — I-STAT ARTERIAL BLOOD GAS, ED
ACID-BASE DEFICIT: 1 mmol/L (ref 0.0–2.0)
BICARBONATE: 23.5 mmol/L (ref 20.0–28.0)
O2 SAT: 92 %
TCO2: 25 mmol/L (ref 22–32)
pCO2 arterial: 39.4 mmHg (ref 32.0–48.0)
pH, Arterial: 7.386 (ref 7.350–7.450)
pO2, Arterial: 66 mmHg — ABNORMAL LOW (ref 83.0–108.0)

## 2017-09-19 LAB — GLUCOSE, CAPILLARY: Glucose-Capillary: 100 mg/dL — ABNORMAL HIGH (ref 65–99)

## 2017-09-19 LAB — BRAIN NATRIURETIC PEPTIDE: B NATRIURETIC PEPTIDE 5: 416.1 pg/mL — AB (ref 0.0–100.0)

## 2017-09-19 LAB — CORTISOL: Cortisol, Plasma: 17.5 ug/dL

## 2017-09-19 LAB — TROPONIN I: TROPONIN I: 0.03 ng/mL — AB (ref <0.03)

## 2017-09-19 LAB — I-STAT CG4 LACTIC ACID, ED: Lactic Acid, Venous: 0.95 mmol/L (ref 0.5–1.9)

## 2017-09-19 LAB — MRSA PCR SCREENING: MRSA BY PCR: NEGATIVE

## 2017-09-19 LAB — PROTIME-INR
INR: 0.95
PROTHROMBIN TIME: 12.6 s (ref 11.4–15.2)

## 2017-09-19 LAB — TRIGLYCERIDES: TRIGLYCERIDES: 120 mg/dL (ref <150)

## 2017-09-19 LAB — STREP PNEUMONIAE URINARY ANTIGEN: STREP PNEUMO URINARY ANTIGEN: NEGATIVE

## 2017-09-19 LAB — MAGNESIUM: Magnesium: 1.5 mg/dL — ABNORMAL LOW (ref 1.7–2.4)

## 2017-09-19 MED ORDER — MAGNESIUM SULFATE 2 GM/50ML IV SOLN
2.0000 g | Freq: Once | INTRAVENOUS | Status: AC
  Administered 2017-09-19: 2 g via INTRAVENOUS
  Filled 2017-09-19: qty 50

## 2017-09-19 MED ORDER — VITAL HIGH PROTEIN PO LIQD
1000.0000 mL | ORAL | Status: DC
  Administered 2017-09-19 – 2017-09-20 (×2): 1000 mL
  Administered 2017-09-21 (×4)

## 2017-09-19 MED ORDER — OXYCODONE-ACETAMINOPHEN 5-325 MG PO TABS: 2.0000 | ORAL_TABLET | Freq: Once | ORAL | Status: DC

## 2017-09-19 MED ORDER — MIDAZOLAM HCL 2 MG/2ML IJ SOLN
INTRAMUSCULAR | Status: AC
  Filled 2017-09-19: qty 6

## 2017-09-19 MED ORDER — AZATHIOPRINE 50 MG PO TABS
150.0000 mg | ORAL_TABLET | Freq: Every day | ORAL | Status: DC
  Filled 2017-09-19: qty 3

## 2017-09-19 MED ORDER — SODIUM CHLORIDE 0.9 % IV BOLUS (SEPSIS)
1000.0000 mL | Freq: Once | INTRAVENOUS | Status: DC
  Administered 2017-09-19: 1000 mL via INTRAVENOUS

## 2017-09-19 MED ORDER — FENTANYL 2500MCG IN NS 250ML (10MCG/ML) PREMIX INFUSION
25.0000 ug/h | INTRAVENOUS | Status: DC
Start: 2017-09-19 — End: 2017-09-20
  Administered 2017-09-19: 200 ug/h via INTRAVENOUS
  Administered 2017-09-19 – 2017-09-20 (×3): 400 ug/h via INTRAVENOUS
  Filled 2017-09-19 (×4): qty 250

## 2017-09-19 MED ORDER — MIDAZOLAM HCL 2 MG/2ML IJ SOLN
2.0000 mg | INTRAMUSCULAR | Status: AC | PRN
  Administered 2017-09-19 – 2017-09-20 (×3): 2 mg via INTRAVENOUS
  Filled 2017-09-19 (×3): qty 2

## 2017-09-19 MED ORDER — LORAZEPAM 2 MG/ML IJ SOLN
1.0000 mg | Freq: Once | INTRAMUSCULAR | Status: AC
  Administered 2017-09-19: 1 mg via INTRAVENOUS
  Filled 2017-09-19: qty 1

## 2017-09-19 MED ORDER — PRO-STAT SUGAR FREE PO LIQD
30.0000 mL | Freq: Two times a day (BID) | ORAL | Status: DC
  Administered 2017-09-19 – 2017-09-20 (×3): 30 mL
  Filled 2017-09-19 (×4): qty 30

## 2017-09-19 MED ORDER — ALBUTEROL SULFATE (2.5 MG/3ML) 0.083% IN NEBU: 2.5000 mg | INHALATION_SOLUTION | RESPIRATORY_TRACT | Status: DC | PRN

## 2017-09-19 MED ORDER — AZATHIOPRINE 50 MG PO TABS
50.0000 mg | ORAL_TABLET | Freq: Every day | ORAL | Status: DC
  Administered 2017-09-19 – 2017-09-20 (×2): 50 mg via ORAL
  Filled 2017-09-19 (×2): qty 1

## 2017-09-19 MED ORDER — VECURONIUM BROMIDE 10 MG IV SOLR
INTRAVENOUS | Status: AC
  Filled 2017-09-19: qty 10

## 2017-09-19 MED ORDER — FENTANYL CITRATE (PF) 100 MCG/2ML IJ SOLN
50.0000 ug | Freq: Once | INTRAMUSCULAR | Status: AC
  Administered 2017-09-19: 50 ug via INTRAVENOUS
  Filled 2017-09-19: qty 2

## 2017-09-19 MED ORDER — PANTOPRAZOLE SODIUM 40 MG IV SOLR
40.0000 mg | Freq: Every day | INTRAVENOUS | Status: DC
  Administered 2017-09-19: 40 mg via INTRAVENOUS
  Filled 2017-09-19: qty 40

## 2017-09-19 MED ORDER — POTASSIUM CHLORIDE 20 MEQ/15ML (10%) PO SOLN
40.0000 meq | Freq: Two times a day (BID) | ORAL | Status: DC
  Administered 2017-09-19 (×2): 40 meq
  Filled 2017-09-19 (×3): qty 30

## 2017-09-19 MED ORDER — STERILE WATER FOR INJECTION IJ SOLN
INTRAMUSCULAR | Status: AC
  Filled 2017-09-19: qty 10

## 2017-09-19 MED ORDER — SUCCINYLCHOLINE CHLORIDE 20 MG/ML IJ SOLN
INTRAMUSCULAR | Status: AC | PRN
  Administered 2017-09-19: 120 mg via INTRAVENOUS

## 2017-09-19 MED ORDER — DEXTROSE 5 % IV SOLN
2.0000 g | Freq: Once | INTRAVENOUS | Status: AC
  Administered 2017-09-19: 2 g via INTRAVENOUS
  Filled 2017-09-19: qty 2

## 2017-09-19 MED ORDER — MIDAZOLAM HCL 5 MG/5ML IJ SOLN
INTRAMUSCULAR | Status: AC | PRN
  Administered 2017-09-19: 5 mg via INTRAVENOUS

## 2017-09-19 MED ORDER — FUROSEMIDE 10 MG/ML IJ SOLN
INTRAMUSCULAR | Status: AC
  Filled 2017-09-19: qty 4

## 2017-09-19 MED ORDER — PROPOFOL 1000 MG/100ML IV EMUL
INTRAVENOUS | Status: AC
  Filled 2017-09-19: qty 100

## 2017-09-19 MED ORDER — PIPERACILLIN-TAZOBACTAM 3.375 G IVPB
3.3750 g | Freq: Three times a day (TID) | INTRAVENOUS | Status: DC
  Administered 2017-09-19 – 2017-09-20 (×4): 3.375 g via INTRAVENOUS
  Filled 2017-09-19 (×5): qty 50

## 2017-09-19 MED ORDER — VANCOMYCIN HCL 10 G IV SOLR
1250.0000 mg | Freq: Two times a day (BID) | INTRAVENOUS | Status: DC
  Administered 2017-09-19: 1250 mg via INTRAVENOUS
  Filled 2017-09-19 (×2): qty 1250

## 2017-09-19 MED ORDER — SODIUM CHLORIDE 0.9 % IV BOLUS (SEPSIS)
1000.0000 mL | Freq: Once | INTRAVENOUS | Status: AC
  Administered 2017-09-19: 1000 mL via INTRAVENOUS

## 2017-09-19 MED ORDER — METHYLPREDNISOLONE SODIUM SUCC 125 MG IJ SOLR
60.0000 mg | Freq: Two times a day (BID) | INTRAMUSCULAR | Status: DC
  Administered 2017-09-19 – 2017-09-20 (×2): 60 mg via INTRAVENOUS
  Filled 2017-09-19 (×2): qty 0.96

## 2017-09-19 MED ORDER — SODIUM CHLORIDE 0.9 % IV SOLN: 250.0000 mL | INTRAVENOUS | Status: DC | PRN

## 2017-09-19 MED ORDER — BISACODYL 10 MG RE SUPP: 10.0000 mg | Freq: Every day | RECTAL | Status: DC | PRN

## 2017-09-19 MED ORDER — ORAL CARE MOUTH RINSE
15.0000 mL | Freq: Four times a day (QID) | OROMUCOSAL | Status: DC
  Administered 2017-09-19 – 2017-09-21 (×10): 15 mL via OROMUCOSAL

## 2017-09-19 MED ORDER — FENTANYL 2500MCG IN NS 250ML (10MCG/ML) PREMIX INFUSION
25.0000 ug/h | INTRAVENOUS | Status: DC
  Administered 2017-09-19: 100 ug/h via INTRAVENOUS
  Filled 2017-09-19: qty 250

## 2017-09-19 MED ORDER — VANCOMYCIN HCL IN DEXTROSE 1-5 GM/200ML-% IV SOLN
1000.0000 mg | Freq: Once | INTRAVENOUS | Status: AC
  Administered 2017-09-19: 1000 mg via INTRAVENOUS
  Filled 2017-09-19: qty 200

## 2017-09-19 MED ORDER — VECURONIUM BROMIDE 10 MG IV SOLR: 10.0000 mg | Freq: Once | INTRAVENOUS | Status: DC

## 2017-09-19 MED ORDER — FENTANYL BOLUS VIA INFUSION
50.0000 ug | INTRAVENOUS | Status: DC | PRN
  Filled 2017-09-19: qty 50

## 2017-09-19 MED ORDER — VANCOMYCIN HCL IN DEXTROSE 1-5 GM/200ML-% IV SOLN
1000.0000 mg | Freq: Three times a day (TID) | INTRAVENOUS | Status: DC
  Administered 2017-09-20 – 2017-09-21 (×4): 1000 mg via INTRAVENOUS
  Filled 2017-09-19 (×8): qty 200

## 2017-09-19 MED ORDER — DOCUSATE SODIUM 50 MG/5ML PO LIQD: 100.0000 mg | Freq: Two times a day (BID) | ORAL | Status: DC | PRN

## 2017-09-19 MED ORDER — FUROSEMIDE 10 MG/ML IJ SOLN
INTRAMUSCULAR | Status: AC
  Filled 2017-09-19: qty 8

## 2017-09-19 MED ORDER — PROPOFOL 10 MG/ML IV BOLUS
INTRAVENOUS | Status: DC | PRN
  Administered 2017-09-19 (×2): 10 mg via INTRAVENOUS
  Administered 2017-09-19: 20 mg via INTRAVENOUS

## 2017-09-19 MED ORDER — HEPARIN SODIUM (PORCINE) 5000 UNIT/ML IJ SOLN
5000.0000 [IU] | Freq: Three times a day (TID) | INTRAMUSCULAR | Status: DC
  Administered 2017-09-19 – 2017-09-21 (×8): 5000 [IU] via SUBCUTANEOUS
  Filled 2017-09-19 (×9): qty 1

## 2017-09-19 MED ORDER — PROPOFOL 1000 MG/100ML IV EMUL
0.0000 ug/kg/min | INTRAVENOUS | Status: DC
  Administered 2017-09-19: 20 ug/kg/min via INTRAVENOUS
  Filled 2017-09-19: qty 100

## 2017-09-19 MED ORDER — FENTANYL CITRATE (PF) 100 MCG/2ML IJ SOLN
50.0000 ug | Freq: Once | INTRAMUSCULAR | Status: AC
  Administered 2017-09-19: 50 ug via INTRAVENOUS

## 2017-09-19 MED ORDER — CHLORHEXIDINE GLUCONATE 0.12% ORAL RINSE (MEDLINE KIT)
15.0000 mL | Freq: Two times a day (BID) | OROMUCOSAL | Status: DC
  Administered 2017-09-19 – 2017-09-21 (×5): 15 mL via OROMUCOSAL

## 2017-09-19 MED ORDER — FENTANYL CITRATE (PF) 100 MCG/2ML IJ SOLN: 50.0000 ug | Freq: Once | INTRAMUSCULAR | Status: DC

## 2017-09-19 MED ORDER — ETOMIDATE 2 MG/ML IV SOLN
INTRAVENOUS | Status: AC | PRN
  Administered 2017-09-19: 20 mg via INTRAVENOUS

## 2017-09-19 MED ORDER — PROPOFOL 10 MG/ML IV BOLUS
INTRAVENOUS | Status: AC | PRN
  Administered 2017-09-19: 20 mg via INTRAVENOUS
  Administered 2017-09-19: 40 mg via INTRAVENOUS

## 2017-09-19 MED ORDER — PROPOFOL 1000 MG/100ML IV EMUL
0.0000 ug/kg/min | INTRAVENOUS | Status: DC
  Administered 2017-09-19 – 2017-09-20 (×8): 50 ug/kg/min via INTRAVENOUS
  Filled 2017-09-19 (×8): qty 100

## 2017-09-19 MED ORDER — FUROSEMIDE 10 MG/ML IJ SOLN
80.0000 mg | Freq: Once | INTRAMUSCULAR | Status: AC
  Administered 2017-09-19: 80 mg via INTRAVENOUS

## 2017-09-19 MED ORDER — MIDAZOLAM HCL 2 MG/2ML IJ SOLN
2.0000 mg | INTRAMUSCULAR | Status: DC | PRN
  Administered 2017-09-20 – 2017-09-21 (×4): 2 mg via INTRAVENOUS
  Filled 2017-09-19 (×4): qty 2

## 2017-09-19 MED ORDER — FENTANYL BOLUS VIA INFUSION
50.0000 ug | INTRAVENOUS | Status: DC | PRN
Start: 2017-09-19 — End: 2017-09-19
  Filled 2017-09-19: qty 50

## 2017-09-19 MED ORDER — METHYLPREDNISOLONE SODIUM SUCC 125 MG IJ SOLR
125.0000 mg | Freq: Once | INTRAMUSCULAR | Status: AC
  Administered 2017-09-19: 125 mg via INTRAVENOUS
  Filled 2017-09-19: qty 2

## 2017-09-19 MED ORDER — FUROSEMIDE 10 MG/ML IJ SOLN
40.0000 mg | Freq: Once | INTRAMUSCULAR | Status: AC
  Administered 2017-09-19: 40 mg via INTRAVENOUS
  Filled 2017-09-19: qty 4

## 2017-09-19 NOTE — Progress Notes (Signed)
Pt transported to 2M7 w/o complications.

## 2017-09-19 NOTE — Progress Notes (Signed)
[facility] per MD at bedside. PC 12 weaned Fio2 to 80%. MVE alarm set at 12.0 per MD request to alert change in compliance.

## 2017-09-19 NOTE — Progress Notes (Signed)
RT found at 10. MD may have changed when in room.

## 2017-09-19 NOTE — ED Notes (Addendum)
Respiratory at bedside.

## 2017-09-19 NOTE — ED Provider Notes (Signed)
[place] EMERGENCY DEPARTMENT Provider Note   CSN: [id number] Arrival date & time: 09/18/17  2110     History   Chief Complaint Chief Complaint  Patient presents with  . Shortness of Breath  . Chest Pain  . Migraine    HPI Kayla [name] is a 26 y.o. female.  Patient sent from [facility] with a 1 day history of sudden onset chest pain and shortness of breath.  She was found to have hypoxia and pneumonia on CT scan.  No pulmonary embolism.  Patient with history of chronic hypertension and pulmonary hypertension, ILD on O2 PRN and lupus, she is on chronic steroids.  Reports she is coughing up clear mucus has chest pain in the center of her chest that is worse with palpation.  Has a difficulty breathing and coughing all day that worsened acutely around 3 PM.  She received nebulizers at [facility] and a CT scan that showed no PE.  He describes no problems with this pregnancy and a 6 days postpartum.  She did not require any blood pressure medication management during her pregnancy.  [facility] had low suspicion for preeclampsia.  Patient does use nebulizer and oxygen at home and as needed basis.  She is on chronic 10 mg of prednisone.   The history is provided by the patient and the EMS personnel.  Shortness of Breath  Associated symptoms include headaches, rhinorrhea, cough and chest pain. Pertinent negatives include no fever, no vomiting and no abdominal pain.  Chest Pain   Associated symptoms include cough, headaches, shortness of breath and weakness. Pertinent negatives include no abdominal pain, no dizziness, no fever, no nausea and no vomiting.  Migraine  Associated symptoms include chest pain, headaches and shortness of breath. Pertinent negatives include no abdominal pain.    Past Medical History:  Diagnosis Date  . Arthritis    "hands and legs" (01/08/2015)  . CAP (community acquired pneumonia) 01/07/2015  . Daily headache    "sometimes" (01/08/2015)  . GERD (gastroesophageal reflux disease)   . Hypertension   . Lung disease   . Lupus   . Pulmonary hypertension ([place])   . Sjogren's syndrome [facility])     Patient Active Problem List   Diagnosis Date Noted  . Chronic hypertension in pregnancy 09/12/2017  . Gonorrhea affecting pregnancy 02/17/2017  . History of IUFD 02/14/2017  . 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 Chronic Respiratory failure with oxygen requirement affecting pregnancy, antepartum 05/16/2016  . Chronic hypertension during pregnancy, antepartum 04/14/2016  . Chronic hypertension 03/14/2016  . Other secondary pulmonary hypertension ([place]) 09/04/2015  . Lupus (systemic lupus erythematosus) ([place]) 08/21/2015  . Loud P2 (pulmonary S2, second heart sound) 08/21/2015  . Insomnia 08/21/2015  . Supervision of high risk pregnancy, antepartum 07/06/2015  . Systemic lupus erythematosus (SLE) affecting pregnancy, antepartum ([place]) 07/06/2015  . Sjogren's syndrome ([place]) 04/28/2015  . Interstitial lung disease ([place]) 02/06/2015    Past Surgical History:  Procedure Laterality Date  . CARDIAC CATHETERIZATION N/A 09/09/2015   Procedure: Right Heart Cath;  Surgeon: Larey Dresser, MD;  Location: [place] CV [facility];  Service: Cardiovascular;  Laterality: N/A;  . DILATION AND EVACUATION N/A 07/13/2015   Procedure: DILATATION AND EVACUATION;  Surgeon: Truett Mainland, DO;  Location: [place] [facility];  Service: Gynecology;  Laterality: N/A;  . FINGER SURGERY Right 03/2014   "laceration, nerve/artery injury" 2nd digit  . VIDEO BRONCHOSCOPY Bilateral 01/12/2015   Procedure: VIDEO BRONCHOSCOPY  WITH FLUORO;  Surgeon: Collene Gobble, MD;  Location: [place];  Service: Cardiopulmonary;  Laterality: Bilateral;    OB History    Gravida Para Term Preterm AB Living   _0 SAB TAB Ectopic Multiple Live Births   1 0 0 0 1       Home Medications    Prior to Admission medications    Medication Sig Start Date End Date Taking? Authorizing Provider  albuterol (PROVENTIL HFA;VENTOLIN HFA) 108 (90 Base) MCG/ACT inhaler Inhale 2 puffs into the lungs every 4 (four) hours as needed for wheezing or shortness of breath. 06/09/17  Yes Woodroe Mode, MD  aspirin EC 81 MG tablet Take 1 tablet (81 mg total) by mouth daily. 03/13/17  Yes Guss Bunde, MD  azaTHIOprine (IMURAN) 50 MG tablet Take 3 tablets (150 mg total) by mouth daily. 09/15/17  Yes Minott, Ebony Cargo, MD  butalbital-acetaminophen-caffeine (FIORICET, ESGIC) 50-325-40 MG tablet Take 1 tablet by mouth every 6 (six) hours as needed for headache. 09/06/17 09/06/18 Yes Moss, Amber, DO  folic acid (FOLVITE) 1 MG tablet Take 1 tablet (1 mg total) by mouth daily. 04/13/17  Yes Dove, Myra C, MD  hydroxychloroquine (PLAQUENIL) 200 MG tablet Take 400 mg by mouth daily.   Yes [provider]  ibuprofen (ADVIL,MOTRIN) 600 MG tablet Take 1 tablet (600 mg total) by mouth every 6 (six) hours. 09/15/17  Yes Minott, Ebony Cargo, MD  pantoprazole (PROTONIX) 40 MG tablet Take 1 tablet (40 mg total) by mouth daily. 08/31/17 09/30/17 Yes Woodroe Mode, MD  predniSONE (DELTASONE) 5 MG tablet Take 10 mg by mouth daily with breakfast.    Yes [provider]  Prenatal Vit-Fe Fumarate-FA (PRENATAL COMPLETE) 14-0.4 MG TABS Take 1 tablet by mouth daily. 01/30/17  Yes Duffy Bruce, MD  fluticasone (FLONASE) 50 MCG/ACT nasal spray Place 2 sprays into both nostrils daily. Patient taking differently: Place 2 sprays into both nostrils daily as needed for rhinitis.  04/12/17   Rasch, Anderson Malta I, NP  senna-docusate (SENOKOT-S) 8.6-50 MG tablet Take 2 tablets by mouth daily. 09/16/17   Minott, Ebony Cargo, MD    Family History Family History  Problem Relation Age of Onset  . Diabetes Mother   . Diabetes Maternal Aunt   . Diabetes Maternal Grandmother     Social History Social History  Substance Use Topics  . Smoking status: Former  Smoker    Packs/day: 0.10    Years: 5.00    Types: Cigarettes    Quit date: 11/20/2014  . Smokeless tobacco: Never Used  . Alcohol use No     Allergies   Hydrocodone and Zithromax [azithromycin]   Review of Systems Review of Systems  Constitutional: Positive for activity change and appetite change. Negative for fatigue and fever.  HENT: Positive for congestion and rhinorrhea.   Eyes: Negative for visual disturbance.  Respiratory: Positive for cough and shortness of breath.   Cardiovascular: Positive for chest pain.  Gastrointestinal: Negative for abdominal pain, nausea and vomiting.  Genitourinary: Negative for dysuria, hematuria, vaginal bleeding and vaginal discharge.  Musculoskeletal: Positive for arthralgias and myalgias.  Skin: Negative for wound.  Neurological: Positive for weakness and headaches. Negative for dizziness and light-headedness.    all other systems are negative except as noted in the HPI and PMH.    Physical Exam Updated Vital Signs BP (!) 144/97   Pulse (!) 105   Temp 99.4 F (37.4 C) (Oral)  Resp (!) 32   LMP 12/27/2016 (Exact Date)   SpO2 95%   Physical Exam  Constitutional: She is oriented to person, place, and time. She appears well-developed and well-nourished. She appears distressed.  Moderate respiratory distress  HENT:  Head: Normocephalic and atraumatic.  Mouth/Throat: Oropharynx is clear and moist. No oropharyngeal exudate.  Eyes: Pupils are equal, round, and reactive to light. Conjunctivae and EOM are normal.  Neck: Normal range of motion. Neck supple.  No meningismus.  Cardiovascular: Normal rate, regular rhythm, normal heart sounds and intact distal pulses.   No murmur heard. Pulmonary/Chest: She is in respiratory distress. She has wheezes. She exhibits tenderness.  Tachypnea and accessory muscle use with scattered rhonchi  Abdominal: Soft. There is no tenderness. There is no rebound and no guarding.  Musculoskeletal: Normal  range of motion. She exhibits edema. She exhibits no tenderness.  +1 edema to knees  Neurological: She is alert and oriented to person, place, and time. No cranial nerve deficit. She exhibits normal muscle tone. Coordination normal.   5/5 strength throughout. CN 2-12 intact.Equal grip strength.   Skin: Skin is warm.  Psychiatric: She has a normal mood and affect. Her behavior is normal.  Nursing note and vitals reviewed.    ED Treatments / Results  Labs (all labs ordered are listed, but only abnormal results are displayed) Labs Reviewed  URINALYSIS, ROUTINE W REFLEX MICROSCOPIC - Abnormal; Notable for the following:       Result Value   Leukocytes, UA SMALL (*)    Squamous Epithelial / LPF 0-5 (*)    All other components within normal limits  TROPONIN I - Abnormal; Notable for the following:    Troponin I 0.03 (*)    All other components within normal limits  BRAIN NATRIURETIC PEPTIDE - Abnormal; Notable for the following:    B Natriuretic Peptide 416.1 (*)    All other components within normal limits  COMPREHENSIVE METABOLIC PANEL - Abnormal; Notable for the following:    Potassium 3.3 (*)    Calcium 8.4 (*)    Albumin 3.2 (*)    All other components within normal limits  PROTEIN / CREATININE RATIO, URINE - Abnormal; Notable for the following:    Protein Creatinine Ratio 0.28 (*)    All other components within normal limits  BLOOD GAS, ARTERIAL - Abnormal; Notable for the following:    pH, Arterial 7.219 (*)    pCO2 arterial 66.8 (*)    pO2, Arterial 166 (*)    All other components within normal limits  CBC WITH DIFFERENTIAL/PLATELET - Abnormal; Notable for the following:    WBC 18.9 (*)    RBC 3.44 (*)    Hemoglobin 9.3 (*)    HCT 30.1 (*)    Neutro Abs 17.9 (*)    Lymphs Abs 0.6 (*)    All other components within normal limits  RENAL FUNCTION PANEL - Abnormal; Notable for the following:    Glucose, Bld 112 (*)    BUN 5 (*)    Calcium 7.6 (*)    Albumin 2.9 (*)     All other components within normal limits  MAGNESIUM - Abnormal; Notable for the following:    Magnesium 1.5 (*)    All other components within normal limits  GLUCOSE, CAPILLARY - Abnormal; Notable for the following:    Glucose-Capillary 100 (*)    All other components within normal limits  I-STAT ARTERIAL BLOOD GAS, ED - Abnormal; Notable for the following:  pO2, Arterial 66.0 (*)    All other components within normal limits  CULTURE, BLOOD (ROUTINE X 2)  CULTURE, BLOOD (ROUTINE X 2)  CULTURE, RESPIRATORY (NON-EXPECTORATED)  MRSA PCR SCREENING  CORTISOL  PROTIME-INR  TRIGLYCERIDES  TROPONIN I  TROPONIN I  STREP PNEUMONIAE URINARY ANTIGEN  LEGIONELLA PNEUMOPHILA SEROGP 1 UR AG  TROPONIN I  I-STAT CG4 LACTIC ACID, ED  I-STAT CG4 LACTIC ACID, ED  I-STAT ARTERIAL BLOOD GAS, ED    EKG  EKG Interpretation  Date/Time:  Tuesday September 19 2017 04:29:08 EDT Ventricular Rate:  106 PR Interval:    QRS Duration: 106 QT Interval:  323 QTC Calculation: 429 R Axis:   68 Text Interpretation:  Sinus tachycardia with irregular rate RSR' in V1 or V2, right VCD or RVH Borderline repol abnrm, anterolateral leads Rate faster Confirmed by Ezequiel Essex [phone]) on 09/19/2017 4:56:53 AM       Radiology Ct Angio Chest Pe W Or Wo Contrast  Result Date: 09/18/2017 CLINICAL DATA:  26 year old female with chest pain. Concern for pulmonary embolism. EXAM: CT ANGIOGRAPHY CHEST WITH CONTRAST TECHNIQUE: Multidetector CT imaging of the chest was performed using the standard protocol during bolus administration of intravenous contrast. Multiplanar CT image reconstructions and MIPs were obtained to evaluate the vascular anatomy. CONTRAST:  100 cc Isovue 370 COMPARISON:  Chest radiograph dated 07/27/2017 FINDINGS: Cardiovascular: There is no cardiomegaly or pericardial effusion. The thoracic aorta is unremarkable. The origins of the great vessels of the aortic arch are patent as visualized. Evaluation  of the pulmonary arteries is limited due to respiratory motion artifact and suboptimal visualization of the peripheral branches. No pulmonary artery embolus identified. There is mild dilatation of the main pulmonary trunk suggestive of underlying pulmonary hypertension. Mediastinum/Nodes: There are mildly enlarged bilateral hilar lymph nodes. There is a small hiatal hernia. The esophagus is grossly unremarkable. Lungs/Pleura: There are patchy areas of upper lobe predominant ground-glass density with associated interstitial fibrosis and parenchymal consolidative changes. Multiple subpleural cystic changes noted. There is overall increased degree of patchy airspace ground-glass density compared to the prior CT compatible with progression of known interstitial lung disease. There is scattered lower lobe predominant ground-glass nodular densities most consistent with pneumonia, possibly of an atypical etiology. There is associated interstitial edema. There are small bilateral pleural effusions, new from prior CT. There is no pneumothorax. Upper Abdomen: No acute abnormality. Musculoskeletal: No chest wall abnormality. No acute or significant osseous findings. Review of the MIP images confirms the above findings. IMPRESSION: 1. No CT evidence of pulmonary embolism. 2. Findings most consistent with pneumonia on a background of interstitial lung disease. There has been interval progression of the underlying interstitial lung disease compared to the CT of 2016. Small bilateral pleural effusions noted. 3. Mild dilatation of the main pulmonary trunk suggestive of underlying pulmonary hypertension. These results were called by telephone at the time of interpretation on 09/18/2017 at 11:45 pm to nurse practitioner. LISA LEFTWICH-KIRBY , who verbally acknowledged these results. Electronically Signed   By: Anner Crete M.D.   On: 09/18/2017 23:54    Procedures Procedure Name: Intubation Date/Time: 09/19/2017 4:44  AM Performed by: Ezequiel Essex Pre-anesthesia Checklist: Patient identified, Emergency Drugs available, Suction available and Patient being monitored Oxygen Delivery Method: Non-rebreather mask Preoxygenation: Pre-oxygenation with 100% oxygen Induction Type: Rapid sequence Ventilation: Mask ventilation without difficulty Laryngoscope Size: Glidescope, Mac and 4 Grade View: Grade II Tube size: 7.5 mm Number of attempts: 1 Airway Equipment and Method: Video-laryngoscopy,  Rigid stylet and  Bougie stylet Placement Confirmation: ETT inserted through vocal cords under direct vision,  Positive ETCO2 and Breath sounds checked- equal and bilateral Secured at: 24 cm Tube secured with: ETT holder Dental Injury: Bloody posterior oropharynx  Difficulty Due To: Difficult Airway- due to large tongue, Difficult Airway- due to limited oral opening and Difficulty was anticipated Future Recommendations: Recommend- induction with short-acting agent, and alternative techniques readily available      (including critical care time)  Medications Ordered in ED Medications  hydrALAZINE (APRESOLINE) injection 5-10 mg (not administered)  labetalol (NORMODYNE,TRANDATE) injection 20-40 mg (not administered)  0.9 %  sodium chloride infusion (not administered)  nitroGLYCERIN (NITROSTAT) SL tablet 0.4 mg (0.4 mg Sublingual Given 09/18/17 2213)  ceFEPIme (MAXIPIME) 2 g in dextrose 5 % 50 mL IVPB (not administered)  methylPREDNISolone sodium succinate (SOLU-MEDROL) 125 mg/2 mL injection 125 mg (not administered)  sodium chloride 0.9 % bolus 1,000 mL (not administered)  vancomycin (VANCOCIN) IVPB 1000 mg/200 mL premix (not administered)  aspirin chewable tablet 324 mg (324 mg Oral Given 09/18/17 2208)  iopamidol (ISOVUE-370) 76 % injection 100 mL (100 mLs Intravenous Contrast Given 09/18/17 2252)     Initial Impression / Assessment and Plan / ED Course  I have reviewed the triage vital signs and the nursing  notes.  Pertinent labs & imaging results that were available during my care of the patient were reviewed by me and considered in my medical decision making (see chart for details).    Patient is 6 days postpartum and sent from [facility] with chest pain and shortness of breath and pneumonia on CT scan.  She is tachypneic and hypoxic.  Scattered rhonchi on exam.  Patient given nebulizer and steroids.  Also start broad-spectrum antibiotics.  Discussed with CNM Leftwich at [facility] who feels preeclampsia is low in the differential and her labs do not support this.  There is no proteinuria.  Urine protein creatinine ratio is normal. BP only minimally elevated.  ABG obtained without significant CO2 retention Bronchodilators and steroids given.  Patient treated for healthcare associated pneumonia with broad-spectrum antibiotics and IV fluids.  Cultures obtained. WBC elevated, lactate normal.  Patient meets sepsis criteria with tachycardia, tachypnea and leukocytosis with source of infection.  She is given IV fluids per protocol and IV antibiotics.  Patient remains tachypneic and tachycardic throughout her ED stay.  Increasing oxygen requirements.  Attempted BiPAP which patient is not able to tolerate due to claustrophobia.  ABG does not show any significant CO2 retention.  With worsening work of breathing and persistent tachypnea and tachycardia, decision made to intubate patient as she is failing noninvasive ventilation.  Patient intubated as above.  Discussed with critical care Dr. Oletta Darter.  Patient given Lasix as excessive fluid might be contributing to her respiratory distress  D/w Dr. Gilford Raid at bedside. Family updated.    CRITICAL CARE Performed by: Ezequiel Essex Total critical care time: 90 minutes Critical care time was exclusive of separately billable procedures and treating other patients. Critical care was necessary to treat or prevent imminent or life-threatening  deterioration. Critical care was time spent personally by me on the following activities: development of treatment plan with patient and/or surrogate as well as nursing, discussions with consultants, evaluation of patient's response to treatment, examination of patient, obtaining history from patient or surrogate, ordering and performing treatments and interventions, ordering and review of laboratory studies, ordering and review of radiographic studies, pulse oximetry and re-evaluation of patient's condition.   Final Clinical Impressions(s) /  ED Diagnoses   Final diagnoses:  Acute respiratory failure with hypoxia [facility])  Multifocal pneumonia    New Prescriptions New Prescriptions   No medications on file     Ezequiel Essex, MD 09/19/17 [phone]

## 2017-09-19 NOTE — ED Notes (Signed)
Pt is now on BiPAP tolerating it fairly well.

## 2017-09-19 NOTE — ED Notes (Signed)
[facility] Pt [facility] postpartum from a vaginal birth. Pt woke up w/ SOB and CP. Denies N, V, D. Denies fever. Women[facility] did chest CT and noticed a pneumonia. Pt given aspirin and nitro prior to that and nitro relieved pain from 10 to 6. Pt has Hx of lupus.

## 2017-09-19 NOTE — Progress Notes (Signed)
Pt placed on HFNC per RRT

## 2017-09-19 NOTE — Progress Notes (Signed)
Pt didn't tolerate BiPAP. Pt states that it is blowing to much and that she is claustrophobic. MD aware. MD states that he is going to order a low dose off ativan. Pt has no O2 reserve once took HFNC off. Pt desatted into the mid 70's immediately. Pt is back on HFNC now at this time. SATs are now acceptable.

## 2017-09-19 NOTE — ED Notes (Signed)
X-ray at bedside

## 2017-09-19 NOTE — H&P (Signed)
PULMONARY / CRITICAL CARE MEDICINE   Name: Kayla [name] MRN: [id number] DOB: 12/22/1990    ADMISSION DATE:  09/18/2017 CONSULTATION DATE:  09/19/2017  REFERRING MD:  Dr. Manus Gunning  CHIEF COMPLAINT:  SOB  HISTORY OF PRESENT ILLNESS:  HPI obtained from medical chart review as patient is intubated and sedated.  26 year old female with PMH of ILD on intermittent O2 at home, Lupus (on Imran, ?plaquenil, and prednisone 10 mg daily), Sjogren's syndrome, GERD, arthritis, and former smoker (quit 2015) who was transferred from [facility] MAU to [facility] ED with acute SOB and chest pain.  Patient is followed by [facility].  Patient is 1 week postpartum.  Patient induced at [redacted] weeks gestation given high risk PMH with vaginal delivery on 10/24.  Discharged home on 10/26.  Patient is not breast feeding.  At home, her cousin at bedside reports that she complained of a headache all day but then developed acute onset of SOB and chest pain during the evening of 10/29.  In the ER, she was found to be tachycardic, hypertensive 150's/80, tachypneic, temp 99.3, with increased work of breathing.  CTA chest negative for pulmonary embolism but showed patchy ground glass densities concerning for pneumonia.  Patient was placed on HFNC and then BiPAP but unable to tolerate mask despite ativan.  ABG 7.386/ 39.4/ 66.  CBC pending, lactate 0.95, BNP 416, troponin 0.03, urine negative for protein.  EKG non-acute. Treated with 30 ml/kg NS, vancomycin, and cefepime.  Patient subsequently intubated in ER for worsening respiratory distress and failure.  PCCM to admit.  PAST MEDICAL HISTORY :  She  has a past medical history of Arthritis; CAP (community acquired pneumonia) (01/07/2015); Daily headache; GERD (gastroesophageal reflux disease); Hypertension; Lung disease; Lupus; Pulmonary hypertension (HCC); and Sjogren's syndrome (HCC).  PAST SURGICAL HISTORY: She  has a past surgical history that includes Finger  surgery (Right, 03/2014); Video bronchoscopy (Bilateral, 01/12/2015); Dilation and evacuation (N/A, 07/13/2015); and Cardiac catheterization (N/A, 09/09/2015).  Allergies  Allergen Reactions  . Hydrocodone Nausea And Vomiting  . Zithromax [Azithromycin] Itching and Cough    No current facility-administered medications on file prior to encounter.    Current Outpatient Prescriptions on File Prior to Encounter  Medication Sig  . albuterol (PROVENTIL HFA;VENTOLIN HFA) 108 (90 Base) MCG/ACT inhaler Inhale 2 puffs into the lungs every 4 (four) hours as needed for wheezing or shortness of breath.  Marland Kitchen aspirin EC 81 MG tablet Take 1 tablet (81 mg total) by mouth daily.  Marland Kitchen azaTHIOprine (IMURAN) 50 MG tablet Take 3 tablets (150 mg total) by mouth daily.  . butalbital-acetaminophen-caffeine (FIORICET, ESGIC) 50-325-40 MG tablet Take 1 tablet by mouth every 6 (six) hours as needed for headache.  . folic acid (FOLVITE) 1 MG tablet Take 1 tablet (1 mg total) by mouth daily.  . hydroxychloroquine (PLAQUENIL) 200 MG tablet Take 400 mg by mouth daily.  Marland Kitchen ibuprofen (ADVIL,MOTRIN) 600 MG tablet Take 1 tablet (600 mg total) by mouth every 6 (six) hours.  . pantoprazole (PROTONIX) 40 MG tablet Take 1 tablet (40 mg total) by mouth daily.  . predniSONE (DELTASONE) 5 MG tablet Take 10 mg by mouth daily with breakfast.   . Prenatal Vit-Fe Fumarate-FA (PRENATAL COMPLETE) 14-0.4 MG TABS Take 1 tablet by mouth daily.  . fluticasone (FLONASE) 50 MCG/ACT nasal spray Place 2 sprays into both nostrils daily. (Patient taking differently: Place 2 sprays into both nostrils daily as needed for rhinitis. )  . senna-docusate (SENOKOT-S) 8.6-50 MG tablet  Take 2 tablets by mouth daily.    FAMILY HISTORY:  Her indicated that her mother is alive. She indicated that her father is alive. She indicated that her sister is alive. She indicated that her brother is alive. She indicated that the status of her maternal grandmother is unknown.  She indicated that the status of her maternal aunt is unknown.    SOCIAL HISTORY: She  reports that she quit smoking about 2 years ago. Her smoking use included Cigarettes. She has a 0.50 pack-year smoking history. She has never used smokeless tobacco. She reports that she does not drink alcohol or use drugs.  REVIEW OF SYSTEMS:   Unable to assess as patient is intubated and encephalopathic.  SUBJECTIVE:  On propofol 40 mcg/kg/min and fentanyl 200 mcg/min  VITAL SIGNS: BP 128/78   Pulse (!) 139   Temp 99.4 F (37.4 C) (Oral)   Resp (!) 28   LMP 12/27/2016 (Exact Date)   SpO2 96%   HEMODYNAMICS:    VENTILATOR SETTINGS:    INTAKE / OUTPUT: No intake/output data recorded.  PHYSICAL EXAMINATION: General:  Young adult AA female sedated and intubated HEENT: MM pink/moist, PERRL, OGT/ ETT Neuro:  Sedated, not f/c, MAE CV: ST, no m/r/g PULM: even/non-labored on MV, lungs bilaterally rhonchi with pink frothy secretions GI: soft, non-tender, bs active  Extremities: warm/dry, no edema  Skin: no rashes  LABS:  BMET  Recent Labs Lab 09/18/17 2210  NA 141  K 3.3*  CL 110  CO2 24  BUN 8  CREATININE 0.58  GLUCOSE 84    Electrolytes  Recent Labs Lab 09/18/17 2210  CALCIUM 8.4*    CBC  Recent Labs Lab 09/12/17 1413 09/13/17 0202 09/13/17 0803  WBC 6.7 11.0* 11.6*  HGB 12.0 12.0 9.8*  HCT 35.2* 35.3* 28.8*  PLT 160 159 150    Coag's No results for input(s): APTT, INR in the last 168 hours.  Sepsis Markers  Recent Labs Lab 09/19/17 0144  LATICACIDVEN 0.95    ABG  Recent Labs Lab 09/19/17 0203  PHART 7.386  PCO2ART 39.4  PO2ART 66.0*    Liver Enzymes  Recent Labs Lab 09/18/17 2210  AST 29  ALT 23  ALKPHOS 91  BILITOT 0.5  ALBUMIN 3.2*    Cardiac Enzymes  Recent Labs Lab 09/18/17 2205  TROPONINI 0.03*    Glucose No results for input(s): GLUCAP in the last 168 hours.  Imaging Ct Angio Chest Pe W Or Wo  Contrast  Result Date: 09/18/2017 CLINICAL DATA:  26 year old female with chest pain. Concern for pulmonary embolism. EXAM: CT ANGIOGRAPHY CHEST WITH CONTRAST TECHNIQUE: Multidetector CT imaging of the chest was performed using the standard protocol during bolus administration of intravenous contrast. Multiplanar CT image reconstructions and MIPs were obtained to evaluate the vascular anatomy. CONTRAST:  100 cc Isovue 370 COMPARISON:  Chest radiograph dated 07/27/2017 FINDINGS: Cardiovascular: There is no cardiomegaly or pericardial effusion. The thoracic aorta is unremarkable. The origins of the great vessels of the aortic arch are patent as visualized. Evaluation of the pulmonary arteries is limited due to respiratory motion artifact and suboptimal visualization of the peripheral branches. No pulmonary artery embolus identified. There is mild dilatation of the main pulmonary trunk suggestive of underlying pulmonary hypertension. Mediastinum/Nodes: There are mildly enlarged bilateral hilar lymph nodes. There is a small hiatal hernia. The esophagus is grossly unremarkable. Lungs/Pleura: There are patchy areas of upper lobe predominant ground-glass density with associated interstitial fibrosis and parenchymal consolidative changes. Multiple subpleural  cystic changes noted. There is overall increased degree of patchy airspace ground-glass density compared to the prior CT compatible with progression of known interstitial lung disease. There is scattered lower lobe predominant ground-glass nodular densities most consistent with pneumonia, possibly of an atypical etiology. There is associated interstitial edema. There are small bilateral pleural effusions, new from prior CT. There is no pneumothorax. Upper Abdomen: No acute abnormality. Musculoskeletal: No chest wall abnormality. No acute or significant osseous findings. Review of the MIP images confirms the above findings. IMPRESSION: 1. No CT evidence of pulmonary  embolism. 2. Findings most consistent with pneumonia on a background of interstitial lung disease. There has been interval progression of the underlying interstitial lung disease compared to the CT of 2016. Small bilateral pleural effusions noted. 3. Mild dilatation of the main pulmonary trunk suggestive of underlying pulmonary hypertension. These results were called by telephone at the time of interpretation on 09/18/2017 at 11:45 pm to nurse practitioner. LISA LEFTWICH-KIRBY , who verbally acknowledged these results. Electronically Signed   By: Elgie Collard M.D.   On: 09/18/2017 23:54   Dg Chest Portable 1 View  Result Date: 09/19/2017 CLINICAL DATA:  Post intubation. EXAM: PORTABLE CHEST 1 VIEW COMPARISON:  CT chest September 18, 2017 FINDINGS: RIGHT mainstem bronchus intubation on initial chest radiograph. Endotracheal tube retraction on follow-up radiograph, distal tip projects 2.5 cm above the carina. Nasogastric tube past GE junction, safe side port at GE junction. Dense interstitial and alveolar consolidation. No pleural effusion. Cardiomediastinal silhouette is normal. No pneumothorax. Soft tissue planes and included osseous structures are nonsuspicious. IMPRESSION: Endotracheal tube tip projects 2.5 cm above the carina. Nasogastric tube past GE junction, distal tip out of field-of-view. Severe interstitial and alveolar airspace opacities consistent with pneumonia superimposed on chronic interstitial lung disease. Electronically Signed   By: Awilda Metro M.D.   On: 09/19/2017 05:11   STUDIES:  CTA chest 10/29 >> 1. No CT evidence of pulmonary embolism. 2. Findings most consistent with pneumonia on a background of interstitial lung disease. There has been interval progression of the underlying interstitial lung disease compared to the CT of 2016. Small bilateral pleural effusions noted. 3. Mild dilatation of the main pulmonary trunk suggestive of underlying pulmonary  hypertension.  CULTURES: BC x 2 10/30 >> Trach asp 10/30 >>  ANTIBIOTICS: 10/30 Vancomycin >> 10/30 Cefepime >>  SIGNIFICANT EVENTS: 10/30 Admit/ intubated  LINES/TUBES: PIV x 2 10/30 ETT >> 10/30 OGT >> 10/30 Foley >>  DISCUSSION: 55 yoF w/ CTA related ILD, SLE, Sjogren s/p postpartum induced vaginal delivery on 10/24 presenting 10/29 PM with headache and acute SOB/ CP.  Failed HFNC and BiPAP.  Worsening respiratory distress after IVF subsequently intubated in ER.  ASSESSMENT / PLAN:  PULMONARY A: Acute hypoxic respiratory failure 2/2 acute pulmonary edema +/- HCAP CTA related ILD/ NSIP- baseline predisone 10mg  daily - on PRN supplemental O2, up to 4L Bradley P:   Full vent support ABG in 1 hour VAP protocol Trend CXR  Lasix 80 mg x 1 now Albuterol prn  Solumedrol 60 mg BID Hold pre-admit prednisone See ID  CARDIOVASCULAR A:  HTN - TTE 08/24/17 w/ EF 55-60%, G1DD, PAP not stated - CTA suggestive of ?Pulmonary HTN given mild dilation of pulmonary trunk P:  Tele monitoring  Diuresis as above Trend troponin x 3 Normal lactate  RENAL A:   Hypokalemia S/p 3L NS in ER P:   Lasix 80 mg x 1 KCL 40 meq x 2 Insert foley Trend BMP/mag/  phos / urinary output Replace electrolytes as indicated Avoid nephrotoxic agents, ensure adequate renal perfusion  GASTROINTESTINAL A:   Hx GERD NPO P:   OGT PPI for SUP  HEMATOLOGIC A:   Leukocytosis Anemia- (h/h stable from 10/24 of 9.8) HX SLE, Sjogrens P:  Trend CBC Assess Coags Heparin sq and SCD for VTE ppx Transfuse for Hgb <7 Continue preadmit azathioprine 150mg  daily Hold home plaquenil and prednisone  INFECTIOUS A:   Leukocytosis - may be reactive  R/o HCAP - although symptoms were acute onset, did not sound infectious, patient is immunocompromised with recent hospitalization.  - UA neg P:   Continue empiric vancomycin and cefepime, narrow as able PCT would not be helpful given that she on chronic  immunosuppression Assess urine strep and legionella Follow BC and tracheal aspirate Trend WBC and fever curve  ENDOCRINE A:   No acute issues   P:   Monitor glucose q 4 while NPO  NEUROLOGIC A:   Acute encephalopathy- related to sedatioin P:   RASS goal: -1/-2 Fentanyl gtt Propofol gtt Versed PRN Daily sedation vacation   FAMILY  - Updates:  Patient's cousin at bedside.  States she lives with her and her aunt.  Aunt is caring for baby.    - Inter-disciplinary family meet or Palliative Care meeting due by:  09/26/2017  CCT 60 mins  Posey Boyer, AGACNP-BC [place] [facility] Pgr: [phone] or if no answer [phone] 09/19/2017, 6:01 AM

## 2017-09-19 NOTE — Progress Notes (Signed)
Initial Nutrition Assessment  DOCUMENTATION CODES:   Not applicable  INTERVENTION:    Vital High Protein at 35 ml/h (840 ml per day)  Pro-stat 30 ml BID  Provides 1040 kcal (1705 kcal total with Propofol), 104 gm protein, 702 ml free water daily  NUTRITION DIAGNOSIS:   Inadequate oral intake related to inability to eat as evidenced by NPO status.  GOAL:   Patient will meet greater than or equal to 90% of their needs  MONITOR:   Vent status, TF tolerance, Labs, I & O's  REASON FOR ASSESSMENT:   Ventilator, Consult Enteral/tube feeding initiation and management  ASSESSMENT:   26 yo female with PMH of GERD, arthritis, SLE, ILD, pulmonary HTN, and Sjogren's syndrome who is 1 week post partum and [facility] with SOB and chest pain; intubated on admission.   Family at bedside reports patient had no nutrition issues PTA.  Received MD Consult for TF initiation and management. Patient is currently intubated on ventilator support MV: 8 L/min Temp (24hrs), Avg:99.3 F (37.4 C), Min:98.3 F (36.8 C), Max:99.9 F (37.7 C)  Propofol: 25.2 ml/hr providing 665 kcal per day from lipid Labs reviewed. Magnesium 1.5 (L) Medications reviewed and include Lasix and KCl  NUTRITION - FOCUSED PHYSICAL EXAM:  Nutrition-Focused physical exam completed. Findings are no fat depletion, no muscle depletion, and no edema.   Diet Order:  Diet NPO time specified  EDUCATION NEEDS:   No education needs have been identified at this time  Skin:  Skin Assessment: Reviewed RN Assessment  Last BM:  PTA  Height:   Ht Readings from Last 1 Encounters:  09/19/17 5\' 3"  (1.6 m)    Weight:   Wt Readings from Last 1 Encounters:  09/19/17 178 lb 2.1 oz (80.8 kg)     Ideal Body Weight:  52.3 kg  BMI:  29.8 (using pre-pregnancy weight 168 lbs)  Estimated Nutritional Needs:   Kcal:  1740  Protein:  100-115 gm  Fluid:  1.8-2 L   Joaquin CourtsKimberly  Harris, RD, LDN, CNSC Pager [phone] After Hours Pager [phone]

## 2017-09-19 NOTE — Progress Notes (Signed)
Titrated down per MD.

## 2017-09-19 NOTE — Procedures (Signed)
Arterial Catheter Insertion Procedure Note Kayla [name] [id number] 04/26/1991  Procedure: Insertion of Arterial Catheter  Indications: Blood pressure monitoring and Frequent blood sampling  Procedure Details Consent: Risks of procedure as well as the alternatives and risks of each were explained to the (patient/caregiver).  Consent for procedure obtained. Time Out: Verified patient identification, verified procedure, site/side was marked, verified correct patient position, special equipment/implants available, medications/allergies/relevent history reviewed, required imaging and test results available.  Performed  Maximum sterile technique was used including antiseptics, cap, gloves, gown, hand hygiene, mask and sheet. Skin prep: Chlorhexidine; local anesthetic administered 20 gauge catheter was inserted into right radial artery using the Seldinger technique.  Evaluation Blood flow good; BP tracing good. Complications: No apparent complications.  Procedure performed under direct ultrasound guidance for real time vessel cannulation.      [name], PA - C [place] [facility] Pager: [phone]  or [phone] 09/19/2017, 10:08 AM  I was present and supervised the entire procedure.  Alyson ReedyWesam G. Craigory Toste, M.D. [facility]. Pager: [phone]. After hours pager: [phone]

## 2017-09-19 NOTE — Progress Notes (Signed)
PULMONARY / CRITICAL CARE MEDICINE   Name: Genesee Nase MRN: [id number] DOB: 27-Jun-1991    ADMISSION DATE:  09/18/2017 CONSULTATION DATE:  09/19/2017  REFERRING MD:  Dr. Manus Gunning  CHIEF COMPLAINT:  SOB  HISTORY OF PRESENT ILLNESS:  HPI obtained from medical chart review as patient is intubated and sedated.  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  Patient is followed by [facility] Pulmonary and Rheumatology.  Patient is 1 week postpartum.  Patient induced at [redacted] weeks gestation given high risk PMH with vaginal delivery on 10/24.  Discharged home on 10/26.  Patient is not breast feeding.  At home, her cousin at bedside reports that she complained of a headache all day but then developed acute onset of SOB and chest pain during the evening of 10/29.  In the ER, she was found to be tachycardic, hypertensive 150's/80, tachypneic, temp 99.3, with increased work of breathing.  CTA chest negative for pulmonary embolism but showed patchy ground glass densities concerning for pneumonia.  Patient was placed on HFNC and then BiPAP but unable to tolerate mask despite ativan.  ABG 7.386/ 39.4/ 66.  CBC pending, lactate 0.95, BNP 416, troponin 0.03, urine negative for protein.  EKG non-acute. Treated with 30 ml/kg NS, vancomycin, and cefepime.  Patient subsequently intubated in ER for worsening respiratory distress and failure.  PCCM to admit.  SUBJECTIVE:  On propofol 40 mcg/kg/min and fentanyl 200 mcg/min  VITAL SIGNS: BP 105/83   Pulse 94   Temp 99.5 F (37.5 C) (Oral)   Resp (!) 24   Ht 5\' 3"  (1.6 m)   Wt 80.8 kg (178 lb 2.1 oz)   LMP 12/27/2016 (Exact Date)   SpO2 100%   BMI 31.55 kg/m   HEMODYNAMICS:    VENTILATOR SETTINGS: Vent Mode: PCV FiO2 (%):  [80 %-100 %] 80  % Set Rate:  [20 bmp] 20 bmp PEEP:  [5 cmH20] 5 cmH20 Plateau Pressure:  [22 cmH20] 22 cmH20  INTAKE / OUTPUT: I/O last 3 completed shifts: In: 2502.6 [I.V.:102.6; IV Piggyback:2400] Out: 2400 [Urine:2400]  PHYSICAL EXAMINATION: General:  Young adult AA female sedated and intubated HEENT: MM pink/moist, PERRL, OGT/ ETT Neuro:  Sedated, not f/c, MAE CV: ST, no m/r/g PULM: even/non-labored on MV, lungs bilaterally rhonchi with pink frothy secretions GI: soft, non-tender, bs active  Extremities: warm/dry, no edema  Skin: no rashes  LABS:  BMET  Recent Labs Lab 09/18/17 2210 09/19/17 0516  NA 141 139  K 3.3* 4.0  CL 110 107  CO2 24 24  BUN 8 5*  CREATININE 0.58 0.56  GLUCOSE 84 112*   Electrolytes  Recent Labs Lab 09/18/17 2210 09/19/17 0516  CALCIUM 8.4* 7.6*  MG  --  1.5*  PHOS  --  4.6   CBC  Recent Labs Lab 09/13/17 0202 09/13/17 0803 09/19/17 0516  WBC 11.0* 11.6* 18.9*  HGB 12.0 9.8* 9.3*  HCT 35.3* 28.8* 30.1*  PLT 159 150 286   Coag's  Recent Labs Lab 09/19/17 0516  INR 0.95   Sepsis Markers  Recent Labs Lab 09/19/17 0144  LATICACIDVEN 0.95   ABG  Recent Labs Lab 09/19/17 0203 09/19/17 0620  PHART 7.386 7.219*  PCO2ART 39.4 66.8*  PO2ART 66.0* 166*   Liver Enzymes  Recent Labs  Lab 09/18/17 2210 09/19/17 0516  AST 29  --   ALT 23  --   ALKPHOS 91  --   BILITOT 0.5  --   ALBUMIN 3.2* 2.9*   Cardiac Enzymes  Recent Labs Lab 09/18/17 2205 09/19/17 0516  TROPONINI 0.03* 0.03*   Glucose  Recent Labs Lab 09/19/17 0604  GLUCAP 100*   Imaging Ct Angio Chest Pe W Or Wo Contrast  Result Date: 09/18/2017 CLINICAL DATA:  26 year old female with chest pain. Concern for pulmonary embolism. EXAM: CT ANGIOGRAPHY CHEST WITH CONTRAST TECHNIQUE: Multidetector CT imaging of the chest was performed using the standard protocol during bolus administration of intravenous contrast. Multiplanar CT image reconstructions and MIPs  were obtained to evaluate the vascular anatomy. CONTRAST:  100 cc Isovue 370 COMPARISON:  Chest radiograph dated 07/27/2017 FINDINGS: Cardiovascular: There is no cardiomegaly or pericardial effusion. The thoracic aorta is unremarkable. The origins of the great vessels of the aortic arch are patent as visualized. Evaluation of the pulmonary arteries is limited due to respiratory motion artifact and suboptimal visualization of the peripheral branches. No pulmonary artery embolus identified. There is mild dilatation of the main pulmonary trunk suggestive of underlying pulmonary hypertension. Mediastinum/Nodes: There are mildly enlarged bilateral hilar lymph nodes. There is a small hiatal hernia. The esophagus is grossly unremarkable. Lungs/Pleura: There are patchy areas of upper lobe predominant ground-glass density with associated interstitial fibrosis and parenchymal consolidative changes. Multiple subpleural cystic changes noted. There is overall increased degree of patchy airspace ground-glass density compared to the prior CT compatible with progression of known interstitial lung disease. There is scattered lower lobe predominant ground-glass nodular densities most consistent with pneumonia, possibly of an atypical etiology. There is associated interstitial edema. There are small bilateral pleural effusions, new from prior CT. There is no pneumothorax. Upper Abdomen: No acute abnormality. Musculoskeletal: No chest wall abnormality. No acute or significant osseous findings. Review of the MIP images confirms the above findings. IMPRESSION: 1. No CT evidence of pulmonary embolism. 2. Findings most consistent with pneumonia on a background of interstitial lung disease. There has been interval progression of the underlying interstitial lung disease compared to the CT of 2016. Small bilateral pleural effusions noted. 3. Mild dilatation of the main pulmonary trunk suggestive of underlying pulmonary hypertension. These  results were called by telephone at the time of interpretation on 09/18/2017 at 11:45 pm to nurse practitioner. LISA LEFTWICH-KIRBY , who verbally acknowledged these results. Electronically Signed   By: Elgie Collard M.D.   On: 09/18/2017 23:54   Dg Chest Portable 1 View  Result Date: 09/19/2017 CLINICAL DATA:  Post intubation. EXAM: PORTABLE CHEST 1 VIEW COMPARISON:  CT chest September 18, 2017 FINDINGS: RIGHT mainstem bronchus intubation on initial chest radiograph. Endotracheal tube retraction on follow-up radiograph, distal tip projects 2.5 cm above the carina. Nasogastric tube past GE junction, safe side port at GE junction. Dense interstitial and alveolar consolidation. No pleural effusion. Cardiomediastinal silhouette is normal. No pneumothorax. Soft tissue planes and included osseous structures are nonsuspicious. IMPRESSION: Endotracheal tube tip projects 2.5 cm above the carina. Nasogastric tube past GE junction, distal tip out of field-of-view. Severe interstitial and alveolar airspace opacities consistent with pneumonia superimposed on chronic interstitial lung disease. Electronically Signed   By: Awilda Metro M.D.   On: 09/19/2017 05:11   STUDIES:  CTA chest 10/29 >> 1. No CT evidence of pulmonary embolism. 2. Findings most consistent with pneumonia on a background of interstitial lung disease. There has been interval progression of  the underlying interstitial lung disease compared to the CT of 2016. Small bilateral pleural effusions noted. 3. Mild dilatation of the main pulmonary trunk suggestive of underlying pulmonary hypertension.  CULTURES: BC x 2 10/30 >> Trach asp 10/30 >>  ANTIBIOTICS: 10/30 Vancomycin >> 10/30 Cefepime >>  SIGNIFICANT EVENTS: 10/30 Admit/ intubated  LINES/TUBES: PIV x 2 10/30 ETT >> 10/30 OGT >> 10/30 Foley >>  DISCUSSION: [age] yoF w/ CTA related ILD, SLE, Sjogren s/p postpartum induced vaginal delivery on 10/24 presenting 10/29 PM with  headache and acute SOB/ CP.  Failed HFNC and BiPAP.  Worsening respiratory distress after IVF subsequently intubated in ER.  ASSESSMENT / PLAN:  PULMONARY A: Acute hypoxic respiratory failure 2/2 acute pulmonary edema +/- HCAP CTA related ILD/ NSIP- baseline predisone 10mg  daily - on PRN supplemental O2, up to 4L Elim P:   Full vent support Adjust vent for ABG Place a-line and recheck ABG VAP protocol Trend CXR  Lasix 40 mg x1 dose Albuterol prn  Solumedrol 60 mg BID Hold pre-admit prednisone See ID  CARDIOVASCULAR A:  HTN - TTE 08/24/17 w/ EF 55-60%, G1DD, PAP not stated - CTA suggestive of ?Pulmonary HTN given mild dilation of pulmonary trunk P:  Tele monitoring  Diuresis as above Trend troponin x 3 Normal lactate  RENAL A:   Hypokalemia S/p 3L NS in ER P:   Lasix 40 mg x 1 Replace electrolytes as indicated Insert foley Trend BMP/mag/ phos / urinary output Replace electrolytes as indicated Avoid nephrotoxic agents, ensure adequate renal perfusion  GASTROINTESTINAL A:   Hx GERD NPO P:   OGT PPI for SUP TF per nutrition  HEMATOLOGIC A:   Leukocytosis Anemia- (h/h stable from 10/24 of 9.8) HX SLE, Sjogrens P:  Trend CBC Assess Coags Heparin sq and SCD for VTE ppx Transfuse for Hgb <7 Continue preadmit azathioprine 150mg  daily Hold home plaquenil and prednisone  INFECTIOUS A:   Leukocytosis - may be reactive  R/o HCAP - although symptoms were acute onset, did not sound infectious, patient is immunocompromised with recent hospitalization.  - UA neg P:   Continue empiric vancomycin and cefepime, narrow as able PCT would not be helpful given that she on chronic immunosuppression Assess urine strep and legionella Follow BC and tracheal aspirate Trend WBC and fever curve  ENDOCRINE A:   No acute issues   P:   Monitor glucose q 4 while NPO  NEUROLOGIC A:   Acute encephalopathy- related to sedatioin P:   RASS goal: -1/-2 Fentanyl  gtt Propofol gtt, will change that in AM to precedex Versed PRN Daily sedation vacation   FAMILY  - Updates:  No family present bedside, will continue sedation, hold off weaning, diurese.  CT of the chest today.  Continue solumedrol.  - Inter-disciplinary family meet or Palliative Care meeting due by:  09/26/2017  The patient is critically ill with multiple organ systems failure and requires high complexity decision making for assessment and support, frequent evaluation and titration of therapies, application of advanced monitoring technologies and extensive interpretation of multiple databases.   Critical Care Time devoted to patient care services described in this note is  35  Minutes. This time reflects time of care of this signee Dr Koren BoundWesam Yacoub. This critical care time does not reflect procedure time, or teaching time or supervisory time of PA/NP/Med student/Med Resident etc but could involve care discussion time.  Alyson ReedyWesam G. Yacoub, M.D. [facility] Pulmonary/Critical Care Medicine. Pager: [phone]. After hours pager: [phone].

## 2017-09-19 NOTE — Progress Notes (Signed)
Patient transported from room 2M07 to CT and back to room 2M07 with no complications. Vitals are stable and sats are 100%. RT will continue to monitor.

## 2017-09-19 NOTE — Progress Notes (Addendum)
Pharmacy Antibiotic Note  Kayla PonsLashonna [name] is a 26 y.o. female admitted on 09/18/2017 with SOB/possible HCAP. Pharmacy has been consulted for Vancomycin.  WBC 18.9, afebrile, BCx pending, Scr 0.56, CrCl ~105 ml/min  Plan: Decrease Vancomycin to 1000 mg IV q8h Continue Zosyn 3.375g Q8h F/U on LOT, BCx, and VT as appropriate   Height: 5\' 3"  (160 cm) Weight: 178 lb 2.1 oz (80.8 kg) IBW/kg (Calculated) : 52.4  Temp (24hrs), Avg:99.3 F (37.4 C), Min:98.3 F (36.8 C), Max:99.9 F (37.7 C)   Recent Labs Lab 09/13/17 0202 09/13/17 0803 09/18/17 2210 09/19/17 0144 09/19/17 0516  WBC 11.0* 11.6*  --   --  18.9*  CREATININE  --   --  0.58  --  0.56  LATICACIDVEN  --   --   --  0.95  --     Estimated Creatinine Clearance: 106.4 mL/min (by C-G formula based on SCr of 0.56 mg/dL).    Allergies  Allergen Reactions  . Hydrocodone Nausea And Vomiting  . Zithromax [Azithromycin] Itching and Cough   Kayla PotterSabrina Ailsa [name], PharmD Pharmacy Resident Pager: [phone]

## 2017-09-19 NOTE — Progress Notes (Signed)
Pt requesting for mask to be taking off. positive reinforcement was given trying to assure patient that it will help her respiratory compromise and labored respirations. Pt requested for it to be removed. Mask removed at this time per pt request. Pt now back on [place].

## 2017-09-19 NOTE — Progress Notes (Signed)
Pharmacy Antibiotic Note  Lelan PonsLashonna Sandoz is a 26 y.o. female admitted on 09/18/2017 with SOB/possible HCAP.  Pharmacy has been consulted for Vancomycin and Cefepime  Dosing.  Vancomycin 1 g IV given in ED at  0145  Plan: Vancomycin 1250 mg IV q12h Cefpeime 1 g IV q8h  Height: 5\' 3"  (160 cm) Weight: 178 lb 2.1 oz (80.8 kg) IBW/kg (Calculated) : 52.4  Temp (24hrs), Avg:99.2 F (37.3 C), Min:98.3 F (36.8 C), Max:99.9 F (37.7 C)   Recent Labs Lab 09/12/17 0701 09/12/17 1413 09/13/17 0202 09/13/17 0803 09/18/17 2210 09/19/17 0144 09/19/17 0516  WBC 6.1 6.7 11.0* 11.6*  --   --  18.9*  CREATININE  --   --   --   --  0.58  --   --   LATICACIDVEN  --   --   --   --   --  0.95  --     Estimated Creatinine Clearance: 106.4 mL/min (by C-G formula based on SCr of 0.58 mg/dL).    Allergies  Allergen Reactions  . Hydrocodone Nausea And Vomiting  . Zithromax [Azithromycin] Itching and Cough   Eddie Candlebbott, Jobany Montellano [place] 09/19/2017 6:23 AM

## 2017-09-19 NOTE — MAU Note (Signed)
[name] VS stable.

## 2017-09-20 ENCOUNTER — Inpatient Hospital Stay (HOSPITAL_COMMUNITY): Payer: Medicare Other

## 2017-09-20 DIAGNOSIS — G9340 Encephalopathy, unspecified: Secondary | ICD-10-CM

## 2017-09-20 LAB — CBC
HCT: 28.1 % — ABNORMAL LOW (ref 36.0–46.0)
HEMOGLOBIN: 8.3 g/dL — AB (ref 12.0–15.0)
MCH: 26.8 pg (ref 26.0–34.0)
MCHC: 29.5 g/dL — ABNORMAL LOW (ref 30.0–36.0)
MCV: 90.6 fL (ref 78.0–100.0)
Platelets: 272 10*3/uL (ref 150–400)
RBC: 3.1 MIL/uL — AB (ref 3.87–5.11)
RDW: 15 % (ref 11.5–15.5)
WBC: 10 10*3/uL (ref 4.0–10.5)

## 2017-09-20 LAB — BLOOD GAS, ARTERIAL
Acid-Base Excess: 2.7 mmol/L — ABNORMAL HIGH (ref 0.0–2.0)
BICARBONATE: 28.4 mmol/L — AB (ref 20.0–28.0)
Drawn by: 51133
FIO2: 40
LHR: 20 {breaths}/min
O2 Saturation: 93.4 %
PEEP/CPAP: 10 cmH2O
Patient temperature: 98.6
Pressure control: 12 cmH2O
pCO2 arterial: 57.7 mmHg — ABNORMAL HIGH (ref 32.0–48.0)
pH, Arterial: 7.313 — ABNORMAL LOW (ref 7.350–7.450)
pO2, Arterial: 75.7 mmHg — ABNORMAL LOW (ref 83.0–108.0)

## 2017-09-20 LAB — GLUCOSE, CAPILLARY
GLUCOSE-CAPILLARY: 135 mg/dL — AB (ref 65–99)
GLUCOSE-CAPILLARY: 140 mg/dL — AB (ref 65–99)
GLUCOSE-CAPILLARY: 103 mg/dL — AB (ref 65–99)
Glucose-Capillary: 136 mg/dL — ABNORMAL HIGH (ref 65–99)

## 2017-09-20 LAB — BASIC METABOLIC PANEL
Anion gap: 7 (ref 5–15)
BUN: 15 mg/dL (ref 6–20)
CHLORIDE: 105 mmol/L (ref 101–111)
CO2: 27 mmol/L (ref 22–32)
Calcium: 8 mg/dL — ABNORMAL LOW (ref 8.9–10.3)
Creatinine, Ser: 0.81 mg/dL (ref 0.44–1.00)
GFR calc Af Amer: >60 mL/min (ref >60)
GFR calc non Af Amer: >60 mL/min (ref >60)
GLUCOSE: 102 mg/dL — AB (ref 65–99)
POTASSIUM: 4.7 mmol/L (ref 3.5–5.1)
Sodium: 139 mmol/L (ref 135–145)

## 2017-09-20 LAB — LEGIONELLA PNEUMOPHILA SEROGP 1 UR AG: L. PNEUMOPHILA SEROGP 1 UR AG: NEGATIVE

## 2017-09-20 LAB — PHOSPHORUS: Phosphorus: 4.5 mg/dL (ref 2.5–4.6)

## 2017-09-20 LAB — MAGNESIUM: Magnesium: 2.3 mg/dL (ref 1.7–2.4)

## 2017-09-20 MED ORDER — FUROSEMIDE 10 MG/ML IJ SOLN
40.0000 mg | Freq: Once | INTRAMUSCULAR | Status: AC
  Administered 2017-09-20: 40 mg via INTRAVENOUS
  Filled 2017-09-20: qty 4

## 2017-09-20 MED ORDER — PROPOFOL 1000 MG/100ML IV EMUL
5.0000 ug/kg/min | INTRAVENOUS | Status: DC
  Administered 2017-09-20: 35 ug/kg/min via INTRAVENOUS
  Administered 2017-09-20 – 2017-09-21 (×3): 50 ug/kg/min via INTRAVENOUS
  Filled 2017-09-20 (×6): qty 100

## 2017-09-20 MED ORDER — METHYLPREDNISOLONE SODIUM SUCC 125 MG IJ SOLR
125.0000 mg | Freq: Four times a day (QID) | INTRAMUSCULAR | Status: DC
  Administered 2017-09-20 – 2017-09-21 (×5): 125 mg via INTRAVENOUS
  Filled 2017-09-20 (×9): qty 2

## 2017-09-20 MED ORDER — SODIUM CHLORIDE 0.9 % IV SOLN
0.4000 ug/kg/h | INTRAVENOUS | Status: DC
  Filled 2017-09-20: qty 2

## 2017-09-20 MED ORDER — FENTANYL 2500MCG IN NS 250ML (10MCG/ML) PREMIX INFUSION
0.0000 ug/h | INTRAVENOUS | Status: DC
  Administered 2017-09-20 – 2017-09-21 (×4): 400 ug/h via INTRAVENOUS
  Filled 2017-09-20 (×3): qty 250

## 2017-09-20 MED ORDER — DEXTROSE 5 % IV SOLN
2.0000 g | Freq: Two times a day (BID) | INTRAVENOUS | Status: DC
  Administered 2017-09-20 – 2017-09-21 (×3): 2 g via INTRAVENOUS
  Filled 2017-09-20 (×4): qty 2

## 2017-09-20 MED ORDER — PANTOPRAZOLE SODIUM 40 MG PO PACK
40.0000 mg | PACK | Freq: Every day | ORAL | Status: DC
  Administered 2017-09-20: 40 mg
  Filled 2017-09-20 (×2): qty 20

## 2017-09-20 MED ORDER — POTASSIUM CHLORIDE 20 MEQ/15ML (10%) PO SOLN
40.0000 meq | Freq: Once | ORAL | Status: AC
Start: 2017-09-20 — End: 2017-09-20
  Administered 2017-09-20: 40 meq

## 2017-09-20 NOTE — Progress Notes (Signed)
Pharmacy Antibiotic Note  Lelan PonsLashonna Dempster is a 26 y.o. female admitted on 09/18/2017 with SOB/possible HCAP. Pharmacy has been consulted to dose Cefepime.   WBC 10, afebrile, BCx pending, Scr has increased from 0.56 to 0.81, CrCl ~105 ml/min. Strep pneumo antigen is negative, legionella still pending.    Plan: Start Cefepime 2g IV Q12h Vancomycin to 1000 mg IV q8h Discontinue Zosyn 3.375g Q8h F/U on LOT, BCx, and VT as appropriate   Height: 5\' 3"  (160 cm) Weight: 181 lb 7 oz (82.3 kg) IBW/kg (Calculated) : 52.4  Temp (24hrs), Avg:100.3 F (37.9 C), Min:99.7 F (37.6 C), Max:101 F (38.3 C)   Recent Labs Lab 09/18/17 2210 09/19/17 0144 09/19/17 0516 09/20/17 0345  WBC  --   --  18.9* 10.0  CREATININE 0.58  --  0.56 0.81  LATICACIDVEN  --  0.95  --   --     Estimated Creatinine Clearance: 106.1 mL/min (by C-G formula based on SCr of 0.81 mg/dL).    Allergies  Allergen Reactions  . Hydrocodone Nausea And Vomiting  . Zithromax [Azithromycin] Itching and Cough   Adline PotterSabrina Suheyb Raucci, PharmD Pharmacy Resident Pager: [phone]

## 2017-09-20 NOTE — Progress Notes (Signed)
PULMONARY / CRITICAL CARE MEDICINE   Name: Kayla [name] MRN: [id number] DOB: [birth year]    ADMISSION DATE:  09/18/2017 CONSULTATION DATE:  09/19/2017  REFERRING MD:  Dr. Manus Gunning  CHIEF COMPLAINT:  SOB  HISTORY OF PRESENT ILLNESS:  HPI obtained from medical chart review as patient is intubated and sedated.  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  Patient is followed by [facility] Pulmonary and Rheumatology.  Patient is 1 week postpartum.  Patient induced at [redacted] weeks gestation given high risk PMH with vaginal delivery on 10/24.  Discharged home on 10/26.  Patient is not breast feeding.  At home, her cousin at bedside reports that she complained of a headache all day but then developed acute onset of SOB and chest pain during the evening of 10/29.  In the ER, she was found to be tachycardic, hypertensive 150's/80, tachypneic, temp 99.3, with increased work of breathing.  CTA chest negative for pulmonary embolism but showed patchy ground glass densities concerning for pneumonia.  Patient was placed on HFNC and then BiPAP but unable to tolerate mask despite ativan.  ABG 7.386/ 39.4/ 66.  CBC pending, lactate 0.95, BNP 416, troponin 0.03, urine negative for protein.  EKG non-acute. Treated with 30 ml/kg NS, vancomycin, and cefepime.  Patient subsequently intubated in ER for worsening respiratory distress and failure.  PCCM to admit.  SUBJECTIVE:  No acute events.  VITAL SIGNS: BP 121/85   Pulse 92   Temp 100 F (37.8 C) (Oral)   Resp 13   Ht 5\' 3"  (1.6 m)   Wt 82.3 kg (181 lb 7 oz)   LMP 12/27/2016 (Exact Date)   SpO2 95%   BMI 32.14 kg/m   HEMODYNAMICS:    VENTILATOR SETTINGS: Vent Mode: PCV;PSV FiO2 (%):  [40 %] 40 % Set Rate:  [20 bmp] 20 bmp PEEP:  [10  cmH20] 10 cmH20 Plateau Pressure:  [17 cmH20-22 cmH20] 21 cmH20  INTAKE / OUTPUT: I/O last 3 completed shifts: In: 5155.6 [I.V.:1570.6; NG/GT:485; IV Piggyback:3100] Out: 5725 [Urine:5725]  PHYSICAL EXAMINATION: General:  Young adult AA female sedated and intubated HEENT: MM pink/moist, PERRL, OGT / ETT Neuro:  Sedated, opens eyes to voice, does not follow commands CV: RRR, no M/R/G. PULM: even/non-labored on MV, Faint wheeze but improved from 10/30 GI: soft, non-tender, bs active  Extremities: warm/dry, no edema Skin: no rashes  LABS:  BMET  Recent Labs Lab 09/18/17 2210 09/19/17 0516 09/20/17 0345  NA 141 139 139  K 3.3* 4.0 4.7  CL 110 107 105  CO2 24 24 27   BUN 8 5* 15  CREATININE 0.58 0.56 0.81  GLUCOSE 84 112* 102*   Electrolytes  Recent Labs Lab 09/18/17 2210 09/19/17 0516 09/20/17 0345  CALCIUM 8.4* 7.6* 8.0*  MG  --  1.5* 2.3  PHOS  --  4.6 4.5   CBC  Recent Labs Lab 09/19/17 0516 09/20/17 0345  WBC 18.9* 10.0  HGB 9.3* 8.3*  HCT 30.1* 28.1*  PLT 286 272   Coag's  Recent Labs Lab 09/19/17 0516  INR 0.95   Sepsis Markers  Recent Labs Lab 09/19/17 0144  LATICACIDVEN 0.95   ABG  Recent Labs Lab 09/19/17 1010 09/19/17 1203 09/20/17 0351  PHART 7.417 7.332* 7.313*  PCO2ART 49.7* 54.9* 57.7*  PO2ART 225.0* 72.0* 75.7*   Liver Enzymes  Recent Labs Lab 09/18/17 2210 09/19/17 0516  AST 29  --   ALT 23  --   ALKPHOS 91  --   BILITOT 0.5  --   ALBUMIN 3.2* 2.9*   Cardiac Enzymes  Recent Labs Lab 09/19/17 0516 09/19/17 1004 09/19/17 1650  TROPONINI 0.03* <0.03 <0.03   Glucose  Recent Labs Lab 09/19/17 0604 09/20/17 0748  GLUCAP 100* 135*   Imaging Ct Chest Wo Contrast  Result Date: 09/19/2017 CLINICAL DATA:  Acute respiratory distress. Intubated yesterday. Lupus. Pulmonary hypertension. Sjogren's syndrome. EXAM: CT CHEST WITHOUT CONTRAST TECHNIQUE: Multidetector CT imaging of the chest was performed  following the standard protocol without IV contrast. COMPARISON:  Plain films of earlier today.  CT of 1 day prior. FINDINGS: Cardiovascular: Motion, patient size, and support apparatus artifact degradation. Borderline cardiomegaly, without pericardial effusion. Pulmonary artery enlargement, better evaluated yesterday. Mediastinum/Nodes: No gross thoracic adenopathy. Lungs/Pleura: Small right-sided pleural effusion, similar. Endotracheal tube terminates approximately 1.6 cm above the carina. Slight worsening aeration since the prior CT. Upper lobe predominant airspace and ground-glass opacity bilaterally. The main progression is at the apices, with progressive ground-glass opacity and smooth septal thickening. Scattered cystic foci which could represent cystic bronchiectasis. Upper Abdomen: Nasogastric tube extending beyond the inferior aspect of the study. Grossly normal imaged portions of the liver, spleen, stomach, adrenal glands, kidneys. Musculoskeletal: No acute osseous abnormality. IMPRESSION: 1. Decreased sensitivity and specificity exam due to technique related factors, as described above. 2. Slight worsening aeration since yesterday's CT. Combination of airspace and ground-glass opacities with septal thickening. Differential considerations include infection (including atypical etiologies), acute respiratory distress syndrome, alveolar proteinosis, an atypical appearance of pulmonary edema. 3. Small right pleural effusion, similar. Electronically Signed   By: Jeronimo GreavesKyle  Talbot M.D.   On: 09/19/2017 18:45   Dg Chest Port 1 View  Result Date: 09/20/2017 CLINICAL DATA:  Respiratory failure EXAM: PORTABLE CHEST 1 VIEW COMPARISON:  Radiograph 09/19/2017, CT 09/19/2017 FINDINGS: Endotracheal tube and NG tube unchanged. Severe upper lobe airspace disease mildly improved. Low lung volumes. No pneumothorax. IMPRESSION: 1. Stable support apparatus. 2. Mild improvement in severe upper lobe airspace disease.  Electronically Signed   By: Genevive BiStewart  Edmunds M.D.   On: 09/20/2017 07:45   STUDIES:  CTA chest 10/29 >> no PE, PNA with underlying ILD.  CULTURES: BC x 2 10/30 >> Trach asp 10/30 >>  ANTIBIOTICS: 10/30 Vancomycin >> 10/30 Cefepime >>  SIGNIFICANT EVENTS: 10/30 Admit/ intubated  LINES/TUBES: PIV x 2 10/30 ETT >> 10/30 OGT >> 10/30 Foley >>  DISCUSSION: [age] yoF w/ CTA related ILD, SLE, Sjogren s/p postpartum induced vaginal delivery on 10/24 presenting 10/29 PM with headache and acute SOB/ CP.  Failed HFNC and BiPAP.  Worsening respiratory distress after IVF subsequently intubated in ER.  ASSESSMENT / PLAN:  PULMONARY A: Acute hypoxic respiratory failure 2/2 acute pulmonary edema +/- HCAP CTA related ILD/ NSIP- baseline predisone 10mg  daily P:   Full vent support - on pressure control now VAP protocol Albuterol prn  Solumedrol 60 mg BID Hold pre-admit prednisone Additional 40 mg lasix today See ID Trend CXR   CARDIOVASCULAR A:  Hx HTN P:  Tele monitoring  Diuresis as above  RENAL A:   No acute issues.  Tolerated lasix 10/30 P:   Additional lasix now Replace electrolytes as indicated Avoid nephrotoxic agents, ensure adequate renal perfusion BMP in AM  GASTROINTESTINAL A:   Hx GERD NPO P:   OGT  PPI for SUP TF per nutrition  HEMATOLOGIC A:   Anemia; chronic - (h/h stable from 10/24 of 9.8) HX SLE, Sjogrens P:  Transfuse for Hgb < 7 Heparin sq and SCD for VTE ppx Continue preadmit azathioprine 150mg  daily Hold home plaquenil and prednisone  INFECTIOUS A:   R/o HCAP - hx did not support infectious; however, patient is immunocompromised and had recent hospitalization.  P:   Continue empiric vancomycin and cefepime, narrow as able PCT would not be helpful given that she is on chronic immunosuppression Follow cultures  ENDOCRINE A:   No acute issues   P:   No interventions required  NEUROLOGIC A:   Acute encephalopathy- related to  sedatioin P:   RASS goal: 0 to -1 Propofol gtt / Fentanyl gtt / midazolam PRN Daily WUA  FAMILY  - Updates:  No family available 10/31.  - Inter-disciplinary family meet or Palliative Care meeting due by:  09/26/2017   CC time: 35 min.   Rutherford Guys, [place] [facility] Pulmonary & Critical Care Medicine Pager: [phone]  or [phone] 09/20/2017, 9:23 AM  Attending Note:  26 year old female with ILD exacerbation.  Patient was in respiratory failure and intubated.  Patient is not improving from a gas exchange standpoint and very dyssynchronous on exam with coarse BS diffusely.  I reviewed CXR myself, diffuse infiltrate noted.  Cultures still pending.  Continue broad spectrum abx.  Increase solumedrol from 60 q12 to 125 q6.  Azathioprine and plaquenil are on hold since unable to crush.  Will continue support.  Adjusted vent to PCV with low rate.  Will continue to monitor.  Aunt updated bedside that there is a concern from her ILD standpoint.  The patient is critically ill with multiple organ systems failure and requires high complexity decision making for assessment and support, frequent evaluation and titration of therapies, application of advanced monitoring technologies and extensive interpretation of multiple databases.   Critical Care Time devoted to patient care services described in this note is  35  Minutes. This time reflects time of care of this signee Dr Koren Bound. This critical care time does not reflect procedure time, or teaching time or supervisory time of PA/NP/Med student/Med Resident etc but could involve care discussion time.  Alyson Reedy, M.D. [facility] Pulmonary/Critical Care Medicine. Pager: [phone]. After hours pager: [phone].

## 2017-09-21 ENCOUNTER — Inpatient Hospital Stay (HOSPITAL_COMMUNITY): Payer: Medicare Other

## 2017-09-21 DIAGNOSIS — J810 Acute pulmonary edema: Secondary | ICD-10-CM

## 2017-09-21 LAB — ECHOCARDIOGRAM COMPLETE
Ao-asc: 29 cm
CHL CUP MV DEC (S): 194
CHL CUP REG VEL DIAS: 147 cm/s
CHL CUP TV REG PEAK VELOCITY: 227 cm/s
E/e' ratio: 11.02
EWDT: 194 ms
FS: 28 % (ref 28–44)
Height: 63 in
IV/PV OW: 0.89
LA diam index: 1.93 cm/m2
LA vol A4C: 61.3 ml
LA vol index: 36.5 mL/m2
LA vol: 69.9 mL
LASIZE: 37 mm
LDCA: 2.01 cm2
LEFT ATRIUM END SYS DIAM: 37 mm
LV E/e'average: 11.02
LV PW d: 9.39 mm — AB (ref 0.6–1.1)
LV TDI E'LATERAL: 10.8
LV TDI E'MEDIAL: 9.14
LV e' LATERAL: 10.8 cm/s
LVEEMED: 11.02
LVOT diameter: 16 mm
MV pk A vel: 59.7 m/s
MV pk E vel: 119 m/s
MVPG: 6 mmHg
PV Reg grad dias: 9 mmHg
RV LATERAL S' VELOCITY: 10.7 cm/s
TAPSE: 18.4 mm
TRMAXVEL: 227 cm/s
WEIGHTICAEL: 2814.83 [oz_av]

## 2017-09-21 LAB — BLOOD GAS, ARTERIAL
ACID-BASE EXCESS: 7 mmol/L — AB (ref 0.0–2.0)
BICARBONATE: 33.8 mmol/L — AB (ref 20.0–28.0)
DRAWN BY: 236041
FIO2: 40
O2 Saturation: 99.2 %
PATIENT TEMPERATURE: 98.2
PCO2 ART: 77.5 mmHg — AB (ref 32.0–48.0)
PEEP: 5 cmH2O
PH ART: 7.261 — AB (ref 7.350–7.450)
PO2 ART: 185 mmHg — AB (ref 83.0–108.0)
PRESSURE CONTROL: 15 cmH2O
RATE: 10 resp/min

## 2017-09-21 LAB — CBC
HCT: 29.9 % — ABNORMAL LOW (ref 36.0–46.0)
Hemoglobin: 8.9 g/dL — ABNORMAL LOW (ref 12.0–15.0)
MCH: 27.6 pg (ref 26.0–34.0)
MCHC: 29.8 g/dL — ABNORMAL LOW (ref 30.0–36.0)
MCV: 92.6 fL (ref 78.0–100.0)
PLATELETS: 293 10*3/uL (ref 150–400)
RBC: 3.23 MIL/uL — ABNORMAL LOW (ref 3.87–5.11)
RDW: 15.2 % (ref 11.5–15.5)
WBC: 10.5 10*3/uL (ref 4.0–10.5)

## 2017-09-21 LAB — BASIC METABOLIC PANEL
Anion gap: 6 (ref 5–15)
BUN: 13 mg/dL (ref 6–20)
CHLORIDE: 100 mmol/L — AB (ref 101–111)
CO2: 34 mmol/L — AB (ref 22–32)
CREATININE: 0.61 mg/dL (ref 0.44–1.00)
Calcium: 8.7 mg/dL — ABNORMAL LOW (ref 8.9–10.3)
GFR calc Af Amer: >60 mL/min (ref >60)
GFR calc non Af Amer: >60 mL/min (ref >60)
Glucose, Bld: 156 mg/dL — ABNORMAL HIGH (ref 65–99)
Potassium: 5.4 mmol/L — ABNORMAL HIGH (ref 3.5–5.1)
SODIUM: 140 mmol/L (ref 135–145)

## 2017-09-21 LAB — PHOSPHORUS: Phosphorus: 3.5 mg/dL (ref 2.5–4.6)

## 2017-09-21 LAB — GLUCOSE, CAPILLARY
GLUCOSE-CAPILLARY: 102 mg/dL — AB (ref 65–99)
GLUCOSE-CAPILLARY: 136 mg/dL — AB (ref 65–99)
GLUCOSE-CAPILLARY: 156 mg/dL — AB (ref 65–99)
Glucose-Capillary: 126 mg/dL — ABNORMAL HIGH (ref 65–99)
Glucose-Capillary: 164 mg/dL — ABNORMAL HIGH (ref 65–99)
Glucose-Capillary: 124 mg/dL — ABNORMAL HIGH (ref 65–99)

## 2017-09-21 LAB — CULTURE, RESPIRATORY W GRAM STAIN: Culture: NORMAL

## 2017-09-21 LAB — CULTURE, RESPIRATORY

## 2017-09-21 LAB — VANCOMYCIN, TROUGH: VANCOMYCIN TR: 8 ug/mL — AB (ref 15–20)

## 2017-09-21 LAB — MAGNESIUM: Magnesium: 2.3 mg/dL (ref 1.7–2.4)

## 2017-09-21 MED ORDER — VANCOMYCIN HCL IN DEXTROSE 750-5 MG/150ML-% IV SOLN
750.0000 mg | Freq: Once | INTRAVENOUS | Status: AC
Start: 2017-09-21 — End: 2017-09-21
  Administered 2017-09-21: 750 mg via INTRAVENOUS
  Filled 2017-09-21: qty 150

## 2017-09-21 MED ORDER — HEPARIN SODIUM (PORCINE) 5000 UNIT/ML IJ SOLN: 5000.0000 [IU] | Freq: Three times a day (TID) | INTRAMUSCULAR | Status: DC

## 2017-09-21 MED ORDER — HYDRALAZINE HCL 20 MG/ML IJ SOLN: 10.0000 mg | INTRAMUSCULAR | Status: DC | PRN

## 2017-09-21 MED ORDER — METHYLPREDNISOLONE SODIUM SUCC 125 MG IJ SOLR
125.0000 mg | Freq: Four times a day (QID) | INTRAMUSCULAR | 0 refills | Status: DC
Start: 2017-09-22 — End: 2018-04-24

## 2017-09-21 MED ORDER — AMLODIPINE BESYLATE 5 MG PO TABS: 5.0000 mg | ORAL_TABLET | Freq: Every day | ORAL | Status: DC

## 2017-09-21 MED ORDER — HYDRALAZINE HCL 20 MG/ML IJ SOLN
INTRAMUSCULAR | Status: AC
  Administered 2017-09-21: 10 mg
  Filled 2017-09-21: qty 1

## 2017-09-21 MED ORDER — DEXTROSE 5 % IV SOLN: 2.0000 g | Freq: Two times a day (BID) | INTRAVENOUS | Status: DC

## 2017-09-21 MED ORDER — VANCOMYCIN HCL 10 G IV SOLR: 1750.0000 mg | Freq: Three times a day (TID) | INTRAVENOUS | Status: DC

## 2017-09-21 MED ORDER — VANCOMYCIN HCL 10 G IV SOLR
1750.0000 mg | Freq: Three times a day (TID) | INTRAVENOUS | Status: DC
  Administered 2017-09-21: 1750 mg via INTRAVENOUS
  Filled 2017-09-21 (×3): qty 1750

## 2017-09-21 MED ORDER — DOCUSATE SODIUM 50 MG/5ML PO LIQD: 100.0000 mg | Freq: Two times a day (BID) | ORAL | Status: DC | PRN

## 2017-09-21 MED ORDER — FUROSEMIDE 10 MG/ML IJ SOLN
40.0000 mg | Freq: Once | INTRAMUSCULAR | Status: AC
  Administered 2017-09-21: 40 mg via INTRAVENOUS
  Filled 2017-09-21: qty 4

## 2017-09-21 MED ORDER — ONDANSETRON HCL 4 MG/2ML IJ SOLN
4.0000 mg | Freq: Four times a day (QID) | INTRAMUSCULAR | Status: DC | PRN
  Administered 2017-09-21: 4 mg via INTRAVENOUS
  Filled 2017-09-21: qty 2

## 2017-09-21 NOTE — Progress Notes (Signed)
eLink Physician-Brief Progress Note Patient Name: Kayla [name] DOB: 05-26-1991 MRN: [id number]   Date of Service  09/21/2017  HPI/Events of Note  Signed dc summary with placing active meds in dc summary  eICU Interventions       Intervention Category Minor Interventions: Communication with other healthcare providers and/or family  Nelda BucksFEINSTEIN,Alicya Bena J. 09/21/2017, 7:44 PM

## 2017-09-21 NOTE — Progress Notes (Signed)
PULMONARY / CRITICAL CARE MEDICINE   Name: Kayla [name] MRN: [id number] DOB: 1991/09/07    ADMISSION DATE:  09/18/2017 CONSULTATION DATE:  09/19/2017  REFERRING MD:  Dr. Manus Gunningancour  CHIEF COMPLAINT:  SOB  HISTORY OF PRESENT ILLNESS:  HPI obtained from medical chart review as patient is intubated and sedated.  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  [facility]  Patient is 1 week postpartum.  Patient induced at [redacted] weeks gestation given high risk PMH with vaginal delivery on 10/24.  Discharged home on 10/26.  Patient is not breast feeding.  At home, her cousin at bedside reports that she complained of a headache all day but then developed acute onset of SOB and chest pain during the evening of 10/29.  In the ER, she was found to be tachycardic, hypertensive 150's/80, tachypneic, temp 99.3, with increased work of breathing.  CTA chest negative for pulmonary embolism but showed patchy ground glass densities concerning for pneumonia.  Patient was placed on HFNC and then BiPAP but unable to tolerate mask despite ativan.  ABG 7.386/ 39.4/ 66.  CBC pending, lactate 0.95, BNP 416, troponin 0.03, urine negative for protein.  EKG non-acute. Treated with 30 ml/kg NS, vancomycin, and cefepime.  Patient subsequently intubated in ER for worsening respiratory distress and failure.  PCCM to admit.  SUBJECTIVE:  -1.3L net.  Had hypercapnia overnight, otherwise no acute events.  VITAL SIGNS: BP 122/88   Pulse 80   Temp 99.3 F (37.4 C) (Oral)   Resp 14   Ht 5\' 3"  (1.6 m)   Wt 79.8 kg (175 lb 14.8 oz)   LMP 12/27/2016 (Exact Date)   SpO2 92%   BMI 31.16 kg/m   HEMODYNAMICS:    VENTILATOR SETTINGS: Vent Mode: PCV FiO2 (%):  [40 %]  40 % Set Rate:  [10 bmp] 10 bmp PEEP:  [5 cmH20] 5 cmH20 Plateau Pressure:  [13 cmH20-29 cmH20] 13 cmH20  INTAKE / OUTPUT: I/O last 3 completed shifts: In: 4035.9 [I.V.:2230.9; NG/GT:1005; IV Piggyback:800] Out: 3725 [Urine:3725]  PHYSICAL EXAMINATION: General:  Young adult AA female sedated and intubated HEENT: MM pink/moist, PERRL, OGT / ETT Neuro:  Sedated, opens eyes to voice, follows commands CV: RRR, no M/R/G PULM: even/non-labored on MV, coarse bilaterally GI: soft, non-tender, bs active  Extremities: warm/dry, no edema Skin: no rashes  LABS:  BMET  Recent Labs Lab 09/19/17 0516 09/20/17 0345 09/21/17 0325  NA 139 139 140  K 4.0 4.7 5.4*  CL 107 105 100*  CO2 24 27 34*  BUN 5* 15 13  CREATININE 0.56 0.81 0.61  GLUCOSE 112* 102* 156*   Electrolytes  Recent Labs Lab 09/19/17 0516 09/20/17 0345 09/21/17 0325  CALCIUM 7.6* 8.0* 8.7*  MG 1.5* 2.3 2.3  PHOS 4.6 4.5 3.5   CBC  Recent Labs Lab 09/19/17 0516 09/20/17 0345 09/21/17 0325  WBC 18.9* 10.0 10.5  HGB 9.3* 8.3* 8.9*  HCT 30.1* 28.1* 29.9*  PLT 286 272 293   Coag's  Recent Labs Lab 09/19/17 0516  INR 0.95   Sepsis Markers  Recent Labs Lab 09/19/17 0144  LATICACIDVEN 0.95   ABG  Recent Labs Lab 09/19/17 1203 09/20/17 0351 09/21/17 0340  PHART 7.332* 7.313* 7.261*  PCO2ART  54.9* 57.7* 77.5*  PO2ART 72.0* 75.7* 185*   Liver Enzymes  Recent Labs Lab 09/18/17 2210 09/19/17 0516  AST 29  --   ALT 23  --   ALKPHOS 91  --   BILITOT 0.5  --   ALBUMIN 3.2* 2.9*   Cardiac Enzymes  Recent Labs Lab 09/19/17 0516 09/19/17 1004 09/19/17 1650  TROPONINI 0.03* <0.03 <0.03   Glucose  Recent Labs Lab 09/20/17 0748 09/20/17 1128 09/20/17 1553 09/20/17 2032 09/21/17 0009 09/21/17 0457  GLUCAP 135* 140* 103* 136* 156* 126*   Imaging Dg Chest Port 1 View  Result Date: 09/21/2017 CLINICAL DATA:  Respiratory failure EXAM: PORTABLE CHEST 1 VIEW COMPARISON:  Chest  radiograph from one day prior. FINDINGS: Right rotated chest radiograph. Endotracheal tube tip is 1.3 cm above the carina. Enteric tube enters stomach with the tip not seen on this image. Stable cardiomediastinal silhouette with normal heart size. No pneumothorax. Stable small right pleural effusion. Patchy consolidation throughout both lungs, most prominent in the upper lungs, with associated volume loss and suggestion of parenchymal distortion. IMPRESSION: 1. Endotracheal tube tip is 1.3 cm above the carina, consider retracting 1 cm. 2. Severe patchy consolidation throughout both lungs with associated volume loss and suggestion of mild parenchymal distortion, most prominent in the upper lungs, not appreciably changed. Findings suggest acute on chronic pulmonary process. 3. Stable small right pleural effusion. Electronically Signed   By: Delbert Phenix M.D.   On: 09/21/2017 07:40   STUDIES:  CTA chest 10/29 >> no PE, PNA with underlying ILD.  CULTURES: BC x 2 10/30 >> Trach asp 10/30 >>  ANTIBIOTICS: 10/30 Vancomycin >> 10/30 Cefepime >>  SIGNIFICANT EVENTS: 10/30 Admit/ intubated  LINES/TUBES: PIV x 2 10/30 ETT >> 10/30 OGT >> 10/30 Foley >>  DISCUSSION: 69 yoF w/ CTA related ILD, SLE, Sjogren s/p postpartum induced vaginal delivery on 10/24 presenting 10/29 PM with headache and acute SOB/ CP.  Failed HFNC and BiPAP.  Worsening respiratory distress after IVF subsequently intubated in ER.  ASSESSMENT / PLAN:  PULMONARY A: Acute hypoxic respiratory failure 2/2 acute pulmonary edema +/- HCAP Acute hypercapnia - after decreasing MV due to dysynchrony CTA related ILD/ NSIP- baseline predisone 10mg  daily P:   Was on full vent support this AM - on pressure control now at lower rate as gets dysynchronous with higher rate / MV Attempting PSV at 15/5 this AM in hopes can wean sedation  VAP protocol Continue solumedrol - dose increased 10/31 Continue BD's See ID Might need bronch with  BAL Trend CXR   CARDIOVASCULAR A:  Hx HTN P:  Tele monitoring  Diuresis as above  RENAL A:   Mild hyperkalemia. P:   Continue lasix today Replace electrolytes as indicated Avoid nephrotoxic agents, ensure adequate renal perfusion BMP in AM  GASTROINTESTINAL A:   Hx GERD NPO P:   OGT PPI for SUP TF per nutrition  HEMATOLOGIC A:   Anemia; chronic - (h/h stable from 10/24 of 9.8) HX SLE, Sjogrens P:  Transfuse for Hgb < 7 Heparin sq and SCD for VTE ppx Holding preadmission plaquenil, azathioprine, and prednisone  INFECTIOUS A:   R/o HCAP - hx did not support infectious; however, patient is immunocompromised and had recent hospitalization.  P:   Continue empiric vancomycin and cefepime, narrow as able PCT would not be helpful given that she is on chronic immunosuppression Follow cultures  ENDOCRINE A:   No acute issues   P:   No interventions required  NEUROLOGIC A:   Acute encephalopathy- related to sedatioin P:   RASS goal: 0 to -1 Propofol gtt / Fentanyl gtt / midazolam PRN - attempting to wean down this AM Daily WUA  FAMILY  - Updates:  No family available 10/31.  Aunt updated 11/1.  Weaning at PSV 15/5, hopefully will continue to tolerate and be more comfortable so can wean sedation.  If does well, will consider early extubation.  - Inter-disciplinary family meet or Palliative Care meeting due by:  09/26/2017.   CC time: 30 min.   Rutherford Guys, PA - C [place] [facility] Pager: [phone]  or [phone] 09/21/2017, 8:09 AM  Attending Note:  26 year old female with ILD and a few other auto-immune disorder who responded well to diureses and is weaning with crackles on exam.  I reviewed CXR myself, infiltrate noted, ETT ok.  Will proceed with extubation.  Ambulate.  Lasix 40 mg IVx1.  Monitor I/O.  Continue steroids at 125 mg IV q6.  Continue vancomycin/cefepime.  F/U on culture.  IS per RT protocol.  Ambulate.   PT evaluation.  SLP per RN.  May need another dose of lasix later on today.  Hold in the ICU since continues to need 50% O2.  The patient is critically ill with multiple organ systems failure and requires high complexity decision making for assessment and support, frequent evaluation and titration of therapies, application of advanced monitoring technologies and extensive interpretation of multiple databases.   Critical Care Time devoted to patient care services described in this note is  35  Minutes. This time reflects time of care of this signee Dr Koren Bound. This critical care time does not reflect procedure time, or teaching time or supervisory time of PA/NP/Med student/Med Resident etc but could involve care discussion time.  Alyson Reedy, M.D. [facility] Pulmonary/Critical Care Medicine. Pager: [phone]. After hours pager: [phone].

## 2017-09-21 NOTE — Procedures (Signed)
Extubation Procedure Note  Patient Details:   Name: Kayla PonsLashonna Durfee DOB: March 04, 1991 MRN: [id number]   Airway Documentation:     Evaluation  O2 sats: transiently fell during during procedure and currently acceptable Complications: No apparent complications Patient did tolerate procedure well. Bilateral Breath Sounds: Clear   Yes  PT was extubated to a 3L [place]  Sats dropped, PT was placed on 55% venti mask  Sats are stable  RT at bedside to monitor   Lelar Farewell, Duane LopeJeffrey D 09/21/2017, 10:18 AM

## 2017-09-21 NOTE — Progress Notes (Signed)
eLink Physician-Brief Progress Note Patient Name: Kayla [name] DOB: Mar 10, 1991 MRN: [id number]   Date of Service  09/21/2017  HPI/Events of Note  Contacted regarding ongoing hypercarbic respiratory failure. Progress note reviewed. Patient difficult to synchronize with ventilator. Unsuccessful attempt at increasing minute ventilation. Patient currently with tidal volumes at or above 8 mL/kg ideal body weight based on recorded height.   eICU Interventions  Continuing permissive hypercarbia      Intervention Category Major Interventions: Respiratory failure - evaluation and management  Lawanda CousinsJennings Cristhian Vanhook 09/21/2017, 4:11 AM

## 2017-09-21 NOTE — Progress Notes (Signed)
Pharmacy Antibiotic Note  Kayla [name] is a 26 y.o. female with a PMH of Lupus, HTN, Pulmonary HTN, and is 1 week post-partum. She was admitted on 09/18/2017 with HCAP. Pharmacy has been consulted for Cefepime and Vancomycin dosing.  Assessment: On admission, pt had a lactic acid level WNL. Today (11/1), patient is afebrile and WBC have been trending down. VT was drawn appropriately, resulting in a subtherapeutic level of 8. Pt is clearing Vanc fast d/t young age and a CrCl of 105.7 mL/min. Thus, we want to increase pt's Vanc dosing from 1g IV Q8H to 1750mg  IV Q8H. No pro-calcitonin levels drawn.   Plan: Vancomycin 1750mg  IV every 8 hours.  Goal trough 15-20 mcg/mL.  Continue Cefepime 2g IV Q12H F/U on LOT, BCx, and VT as appropriate  Height: 5\' 3"  (160 cm) Weight: 175 lb 14.8 oz (79.8 kg) IBW/kg (Calculated) : 52.4  Temp (24hrs), Avg:98.3 F (36.8 C), Min:97.2 F (36.2 C), Max:99.3 F (37.4 C)   Recent Labs Lab 09/18/17 2210 09/19/17 0144 09/19/17 0516 09/20/17 0345 09/21/17 0325 09/21/17 0930  WBC  --   --  18.9* 10.0 10.5  --   CREATININE 0.58  --  0.56 0.81 0.61  --   LATICACIDVEN  --  0.95  --   --   --   --   VANCOTROUGH  --   --   --   --   --  8*    Estimated Creatinine Clearance: 105.7 mL/min (by C-G formula based on SCr of 0.61 mg/dL).    Allergies  Allergen Reactions  . Hydrocodone Nausea And Vomiting  . Zithromax [Azithromycin] Itching and Cough    Antimicrobials this admission: (10/31) Cefepime >> (10/30) Vancomycin >>  Dose adjustments this admission: (10/30) Vancomycin 1g Q8H >> (11/1) 1.75g Q8H  Microbiology results: 10/31 BCx: NGTD, 1 day 10/30 Urine antigen: Step (-), Legionella (-)  11/1 Trach aspirate: Normal respiratory flora  10/30 MRSA PCR: Negative  Thank you for allowing pharmacy to be a part of this patient's care.  Kayleen MemosSonya L Anderson  PharmD Candidate 09/21/2017 11:57 AM   I discussed / reviewed the pharmacy note by Ms. Dareen PianoAnderson,  PharmD Candidate and I agree with the student's findings and plans as documented.  Link SnufferJessica Danyle Boening, PharmD, BCPS, BCCCP Clinical Pharmacist Clinical phone 09/21/2017 until 3:[phone]- [phone] After hours, please call [phone] 09/21/2017, 2:00 PM

## 2017-09-21 NOTE — Discharge Summary (Signed)
Physician Discharge Summary  [name] MRN: [id number] DOB/AGE: December 15, 1990 26 y.o.  Admit date: 09/18/2017 Discharge date: 09/21/2017    Discharge Diagnoses:  Acute hypoxic respiratory failure - secondary to acute pulmonary edema, possible HCAP (immunosupression + recent hospitalization), ILD flare.  Required intubation 10/30, successfully extubated 11/1. CTA related ILD/ NSIP - baseline predisone 64m daily. Hx HTN. Hx GERD. Anemia - chronic. Hx SLE, Sjogrens. Postpartum state - recent vaginal delivery 10/24.                                                                      DISCHARGE PLAN BY DIAGNOSIS    Acute hypoxic respiratory failure - secondary to acute pulmonary edema, possible HCAP (immunosupression + recent hospitalization), ILD flare.  Required intubation 10/30, successfully extubated 11/1. CTA related ILD/ NSIP - baseline predisone 128mdaily. [facility]. Continuing supplemental O2 as needed to maintain SpO2 > 92%. Continuing solumedrol, BD's, abx (vanc / cefepime). Follow cultures. Push mobilization / bronchial hygiene. CXR intermittently. Follow up with Dr. MaRuthann Cancerost discharge.  Hx HTN. Plan: Starting norvasc 44m54maily. Hydralazine PRN. Might need additional agent for long term management.  Hx GERD. Plan: Continue preadmission PPI.  Anemia - chronic. Plan: Transfuse for Hgb < 7. SCD's / Heparin. Follow CBC.  Hx SLE, Sjogrens. Plan:  Continue preadmission plaquenil, azathioprine. Holding preadmission prednisone (on high dose solumedrol currently).  Postpartum state - recent vaginal delivery 10/24. Plan: Continue folic acid, prenatal vitamin.                 DISCHARGE SUMMARY   Kayla [name] a [age]o. 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  Patient is followed by Dr. MarRuthann Cancerth [facility] and [facility].  She is 1 week postpartum, she was induced at [redacted] weeks gestation given high risk PMH.  She had  Normal vaginal delivery on 10/08/67thout complication and was later discharged home on 10/26.  Patient is not breast feeding.  At home, her cousin at bedside reports that she complained of a headache but then developed acute onset of SOB and chest pain during the evening of 10/29.  10/29, she was brought to [facility] where she was found to be tachycardic, hypertensive 150's/80, tachypneic, temp 99.3, with increased work of breathing.  CTA chest negative for pulmonary embolism but showed patchy ground glass densities concerning for pneumonia along with some interstitial edema.  Patient was placed on HFNC and later BiPAP, but was unable to tolerate the mask despite ativan.  She subsequently required intubation for worsening respiratory distress and failure.  She was admitted and was treated for acute pulmonary edema, ILD flare, and possible HCAP.  CT of the chest was obtained (results below).  She was diuresed and had great UOP with net -4.5L at the time of this discharge summary.  She had been on solumedrol 30m14mD; however, due to difficulties weaning FiO2, this was  increased to 144m q6hrs on 10/31 which seemed to have good effect as FiO2 was thereafter gradually weaned.  She had significant dysynchrony with the ventilator while in MICU despite high levels of sedation with propofol and fentanyl infusions as well as PRN midazolam.  Multiple ventilator modes were attempted with little improvement.  Ultimately, she was maintained on PC with low MV as she tolerated these settings best.  On 11/1, we were able to gradually wean her sedation to the point where she was able to follow commands.  We attempted  an SBT which she did well with.  As sedation was weaned, level of PS was also weaned.  She continued to tolerate this well to the point where we decided to proceed with extubation.  She was successfully liberated from the ventilator later that morning.  Post extubation, she initially did well but later had desaturations requiring 40% venti-mask and later 100% NRB.  After several hours and additional lasix, she was weaned back down to 40% FiO2 and was able to maintain SpO2 in high 90's.  Echo was performed that afternoon to evaluate for post partum cardiomyopathy.  Echo returned with normal LV function, no RWMA's, mild to mod MR, mildly dilated LA.  That afternoon after pt was more alert, [facility]  [facility] transfer center was called and the case was discussed with Dr. HRonalee Redwho graciously accepted pt in transfer.             SIGNIFICANT DIAGNOSTIC STUDIES CTA chest 10/29 > no PE, PNA with underlying ILD, some edema. CT chest 10/30 > worsening aeration since prior imaging, GGO with septal thickening.  DDx includes atypical infection, ARDS, alveolar proteinosis, pulmonary edema, small right pleural effusions. Echo 11/1 > normal LV function, no RWMA's, mild to mod MR, mildly dilated LA.  SIGNIFICANT EVENTS 10/30 > admit, intubated. 11/1 > extub[facility]  MICRO DATA  Blood 10/30 >  Sputum 10/30 >   ANTIBIOTICS Vanc 10/30 >  Cefepime 10/30 >   CONSULTS None.  TUBES / LINES ETT 10/30 > 11/1 OGT 10/30 > 11/1   Discharge Exam: General: Adult female, resting in bed comfortably, in NAD. Neuro: A&O x 3, non-focal.  HEENT: [place]/AT. PERRL, sclerae anicteric. Cardiovascular: RRR, no M/R/G.  Lungs: Respirations even and unlabored.  Coarse bilaterally.  Venti-mask in place, 40% FiO2. Abdomen: BS x 4, soft, NT/ND.  Musculoskeletal: No gross deformities, no edema.  Skin: Intact,  warm, no rashes.   Vitals:   09/21/17 1400 09/21/17 1430 09/21/17 1500 09/21/17 1546  BP: (!) 149/96 (!) 151/101 (!) 155/91   Pulse: 84 (!) 122 77   Resp: (!) 35 (!) 25 (!) 29   Temp:    98.4 F (36.9 C)  TempSrc:    Axillary  SpO2: 98% 93% 98%   Weight:      Height:         Discharge Labs  BMET  Recent Labs Lab 09/18/17 2210 09/19/17 0516 09/20/17 0345 09/21/17 0325  NA 141 139 139 140  K 3.3* 4.0 4.7 5.4*  CL 110 107 105 100*  CO2 '24 24 27 ' 34*  GLUCOSE 84 112* 102* 156*  BUN 8 5* 15 13  CREATININE 0.58 0.56 0.81 0.61  CALCIUM 8.4* 7.6* 8.0* 8.7*  MG  --  1.5* 2.3 2.3  PHOS  --  4.6 4.5 3.5    CBC  Recent Labs  Lab 09/19/17 0516 09/20/17 0345 09/21/17 0325  HGB 9.3* 8.3* 8.9*  HCT 30.1* 28.1* 29.9*  WBC 18.9* 10.0 10.5  PLT 286 272 293    Anti-Coagulation  Recent Labs Lab 09/19/17 0516  INR 0.95         No current facility-administered medications on file prior to encounter.    Current Outpatient Prescriptions on File Prior to Encounter  Medication Sig Dispense Refill  . albuterol (PROVENTIL HFA;VENTOLIN HFA) 108 (90 Base) MCG/ACT inhaler Inhale 2 puffs into the lungs every 4 (four) hours as needed for wheezing or shortness of breath. 1 Inhaler 5  . aspirin EC 81 MG tablet Take 1 tablet (81 mg total) by mouth daily. 30 tablet 2  . azaTHIOprine (IMURAN) 50 MG tablet Take 3 tablets (150 mg total) by mouth daily. 90 tablet 0  . butalbital-acetaminophen-caffeine (FIORICET, ESGIC) 50-325-40 MG tablet Take 1 tablet by mouth every 6 (six) hours as needed for headache. 20 tablet 0  . folic acid (FOLVITE) 1 MG tablet Take 1 tablet (1 mg total) by mouth daily. 100 tablet 5  . hydroxychloroquine (PLAQUENIL) 200 MG tablet Take 400 mg by mouth daily.    Marland Kitchen ibuprofen (ADVIL,MOTRIN) 600 MG tablet Take 1 tablet (600 mg total) by mouth every 6 (six) hours. 30 tablet 0  . pantoprazole (PROTONIX) 40 MG tablet Take 1 tablet (40 mg total) by mouth daily. 30 tablet  0  . predniSONE (DELTASONE) 5 MG tablet Take 10 mg by mouth daily with breakfast.     . Prenatal Vit-Fe Fumarate-FA (PRENATAL COMPLETE) 14-0.4 MG TABS Take 1 tablet by mouth daily. 30 each 0  . fluticasone (FLONASE) 50 MCG/ACT nasal spray Place 2 sprays into both nostrils daily. (Patient taking differently: Place 2 sprays into both nostrils daily as needed for rhinitis. ) 16 g 2  . senna-docusate (SENOKOT-S) 8.6-50 MG tablet Take 2 tablets by mouth daily. 60 tablet 0         [facility]: to [facility]  Discharged Condition: Beyza Bellino has met maximum benefit of inpatient care and is medically stable and cleared for discharge.  Patient is pending follow up as above.      Time spent on disposition:  Greater than 35 minutes.

## 2017-09-21 NOTE — Progress Notes (Signed)
  Echocardiogram 2D Echocardiogram has been performed.  Kayla [name] 09/21/2017, 4:56 PM

## 2017-09-21 NOTE — Progress Notes (Signed)
Patient was in bed with respiratory therapist helping her. No family on-site. Patient had a baby who is doing well .Charge nurse very amazing and of course rest of the staff members whom I met on-site. Chaplain provided emotional support, compassionate presence and encouragement.  Chaplain Abdulla Pooley.

## 2017-09-21 NOTE — Progress Notes (Signed)
PCCM Interval Progress Note  Tolerating PSV wean at 5/5.   Following all commands.   Will proceed with extubation.   If remains stable, [facility] and ask [facility] to assume care starting AM 11/2 and PCCM off at that time.   Rutherford Guysahul Ramaj Frangos, [place]PA - [facility] [place] [facility] Pager: [phone]  or [phone] 09/21/2017, 10:07 AM

## 2017-09-24 LAB — CULTURE, BLOOD (ROUTINE X 2)
CULTURE: NO GROWTH
Culture: NO GROWTH
Special Requests: ADEQUATE
Special Requests: ADEQUATE

## 2017-10-25 ENCOUNTER — Ambulatory Visit (INDEPENDENT_AMBULATORY_CARE_PROVIDER_SITE_OTHER): Payer: Medicare Other | Admitting: Medical

## 2017-10-25 ENCOUNTER — Encounter: Payer: Self-pay | Admitting: Medical

## 2017-10-25 VITALS — BP 127/83 | HR 89 | Ht 63.0 in | Wt 168.2 lb

## 2017-10-25 DIAGNOSIS — Z3009 Encounter for other general counseling and advice on contraception: Secondary | ICD-10-CM

## 2017-10-25 DIAGNOSIS — Z30018 Encounter for initial prescription of other contraceptives: Secondary | ICD-10-CM | POA: Diagnosis not present

## 2017-10-25 DIAGNOSIS — Z3049 Encounter for surveillance of other contraceptives: Secondary | ICD-10-CM | POA: Diagnosis present

## 2017-10-25 DIAGNOSIS — Z30017 Encounter for initial prescription of implantable subdermal contraceptive: Secondary | ICD-10-CM

## 2017-10-25 LAB — POCT PREGNANCY, URINE: Preg Test, Ur: NEGATIVE

## 2017-10-25 MED ORDER — ETONOGESTREL 68 MG ~~LOC~~ IMPL
68.0000 mg | DRUG_IMPLANT | Freq: Once | SUBCUTANEOUS | Status: AC
  Administered 2017-10-25: 68 mg via SUBCUTANEOUS

## 2017-10-25 NOTE — Progress Notes (Signed)
GYNECOLOGY CLINIC PROCEDURE NOTE  Ms. Kayla [name] is a 26 y.o. [phone] here for Nexplanon insertion. No GYN concerns.  Last pap smear was in 2016 and was normal.  No other gynecologic concerns.  Results for orders placed or performed in visit on 10/25/17 (from the past 24 hour(s))  Pregnancy, urine POC     Status: None   Collection Time: 10/25/17  2:08 PM  Result Value Ref Range   Preg Test, Ur NEGATIVE NEGATIVE    Nexplanon Insertion Procedure Patient was given informed consent, she signed consent form.  Patient does understand that irregular bleeding is a very common side effect of this medication. She was advised to have backup contraception for one week after placement. Pregnancy test in clinic today was negative.  Appropriate time out taken.  Patient's left arm was prepped. The ruler used to measure and mark insertion area.  Patient was prepped with alcohol swab and then injected with 3 ml of 1% lidocaine.  She was prepped with betadine, Nexplanon removed from packaging,  Device confirmed in needle, then inserted full length of needle and withdrawn per handbook instructions. Nexplanon was able to palpated in the patient's arm; patient palpated the insert herself. There was minimal blood loss.  Patient insertion site covered with guaze and a pressure bandage to reduce any bruising.  The patient tolerated the procedure well and was given post procedure instructions.   Marny LowensteinWenzel, Kayla Mes[place] N, PA-C 10/25/2017 2:33 PM

## 2017-10-25 NOTE — Patient Instructions (Addendum)
Etonogestrel implant What is this medicine? ETONOGESTREL (et oh noe JES trel) is a contraceptive (birth control) device. It is used to prevent pregnancy. It can be used for up to 3 years. This medicine may be used for other purposes; ask your health care provider or pharmacist if you have questions. COMMON BRAND NAME(S): Implanon, Nexplanon What should I tell my health care provider before I take this medicine? They need to know if you have any of these conditions: -abnormal vaginal bleeding -blood vessel disease or blood clots -cancer of the breast, cervix, or liver -depression -diabetes -gallbladder disease -headaches -heart disease or recent heart attack -high blood pressure -high cholesterol -kidney disease -liver disease -renal disease -seizures -tobacco smoker -an unusual or allergic reaction to etonogestrel, other hormones, anesthetics or antiseptics, medicines, foods, dyes, or preservatives -pregnant or trying to get pregnant -breast-feeding How should I use this medicine? This device is inserted just under the skin on the inner side of your upper arm by a health care professional. Talk to your pediatrician regarding the use of this medicine in children. Special care may be needed. Overdosage: If you think you have taken too much of this medicine contact a poison control center or emergency room at once. NOTE: This medicine is only for you. Do not share this medicine with others. What if I miss a dose? This does not apply. What may interact with this medicine? Do not take this medicine with any of the following medications: -amprenavir -bosentan -fosamprenavir This medicine may also interact with the following medications: -barbiturate medicines for inducing sleep or treating seizures -certain medicines for fungal infections like ketoconazole and itraconazole -grapefruit juice -griseofulvin -medicines to treat seizures like carbamazepine, felbamate, oxcarbazepine,  phenytoin, topiramate -modafinil -phenylbutazone -rifampin -rufinamide -some medicines to treat HIV infection like atazanavir, indinavir, lopinavir, nelfinavir, tipranavir, ritonavir -St. John's wort This list may not describe all possible interactions. Give your health care provider a list of all the medicines, herbs, non-prescription drugs, or dietary supplements you use. Also tell them if you smoke, drink alcohol, or use illegal drugs. Some items may interact with your medicine. What should I watch for while using this medicine? This product does not protect you against HIV infection (AIDS) or other sexually transmitted diseases. You should be able to feel the implant by pressing your fingertips over the skin where it was inserted. Contact your doctor if you cannot feel the implant, and use a non-hormonal birth control method (such as condoms) until your doctor confirms that the implant is in place. If you feel that the implant may have broken or become bent while in your arm, contact your healthcare provider. What side effects may I notice from receiving this medicine? Side effects that you should report to your doctor or health care professional as soon as possible: -allergic reactions like skin rash, itching or hives, swelling of the face, lips, or tongue -breast lumps -changes in emotions or moods -depressed mood -heavy or prolonged menstrual bleeding -pain, irritation, swelling, or bruising at the insertion site -scar at site of insertion -signs of infection at the insertion site such as fever, and skin redness, pain or discharge -signs of pregnancy -signs and symptoms of a blood clot such as breathing problems; changes in vision; chest pain; severe, sudden headache; pain, swelling, warmth in the leg; trouble speaking; sudden numbness or weakness of the face, arm or leg -signs and symptoms of liver injury like dark yellow or brown urine; general ill feeling or flu-like symptoms;  light-colored   stools; loss of appetite; nausea; right upper belly pain; unusually weak or tired; yellowing of the eyes or skin -unusual vaginal bleeding, discharge -signs and symptoms of a stroke like changes in vision; confusion; trouble speaking or understanding; severe headaches; sudden numbness or weakness of the face, arm or leg; trouble walking; dizziness; loss of balance or coordination Side effects that usually do not require medical attention (report to your doctor or health care professional if they continue or are bothersome): -acne -back pain -breast pain -changes in weight -dizziness -general ill feeling or flu-like symptoms -headache -irregular menstrual bleeding -nausea -sore throat -vaginal irritation or inflammation This list may not describe all possible side effects. Call your doctor for medical advice about side effects. You may report side effects to FDA at 1-800-FDA-1088. Where should I keep my medicine? This drug is given in a hospital or clinic and will not be stored at home. NOTE: This sheet is a summary. It may not cover all possible information. If you have questions about this medicine, talk to your doctor, pharmacist, or health care provider.  2018 Elsevier/Gold Standard (2016-05-26 11:19:22) Nexplanon Instructions After Insertion   Keep bandage clean and dry for 24 hours   May use ice/Tylenol/Ibuprofen for soreness or pain   If you develop fever, drainage or increased warmth from incision site-contact office immediately   

## 2017-11-23 ENCOUNTER — Other Ambulatory Visit: Payer: Self-pay

## 2017-11-23 ENCOUNTER — Encounter (HOSPITAL_COMMUNITY): Payer: Self-pay | Admitting: *Deleted

## 2017-11-23 ENCOUNTER — Emergency Department (HOSPITAL_COMMUNITY): Payer: Medicare Other

## 2017-11-23 ENCOUNTER — Emergency Department (HOSPITAL_COMMUNITY)
Admission: EM | Admit: 2017-11-23 | Discharge: 2017-11-23 | Disposition: A | Discharge status: Home / Self Care | Payer: Medicare Other | Attending: Emergency Medicine | Admitting: Emergency Medicine

## 2017-11-23 DIAGNOSIS — Z7982 Long term (current) use of aspirin: Secondary | ICD-10-CM | POA: Diagnosis not present

## 2017-11-23 DIAGNOSIS — Z79899 Other long term (current) drug therapy: Secondary | ICD-10-CM | POA: Insufficient documentation

## 2017-11-23 DIAGNOSIS — M25512 Pain in left shoulder: Secondary | ICD-10-CM | POA: Diagnosis not present

## 2017-11-23 DIAGNOSIS — Z87891 Personal history of nicotine dependence: Secondary | ICD-10-CM | POA: Diagnosis not present

## 2017-11-23 DIAGNOSIS — R0789 Other chest pain: Secondary | ICD-10-CM | POA: Diagnosis not present

## 2017-11-23 DIAGNOSIS — I10 Essential (primary) hypertension: Secondary | ICD-10-CM | POA: Diagnosis not present

## 2017-11-23 LAB — CBC
HEMATOCRIT: 42 % (ref 36.0–46.0)
Hemoglobin: 13.7 g/dL (ref 12.0–15.0)
MCH: 26.6 pg (ref 26.0–34.0)
MCHC: 32.6 g/dL (ref 30.0–36.0)
MCV: 81.6 fL (ref 78.0–100.0)
PLATELETS: 271 10*3/uL (ref 150–400)
RBC: 5.15 MIL/uL — ABNORMAL HIGH (ref 3.87–5.11)
RDW: 13.7 % (ref 11.5–15.5)
WBC: 6.3 10*3/uL (ref 4.0–10.5)

## 2017-11-23 LAB — D-DIMER, QUANTITATIVE: D-Dimer, Quant: 0.37 ug/mL-FEU (ref 0.00–0.50)

## 2017-11-23 LAB — I-STAT TROPONIN, ED
Troponin i, poc: 0 ng/mL (ref 0.00–0.08)
Troponin i, poc: 0 ng/mL (ref 0.00–0.08)

## 2017-11-23 LAB — I-STAT BETA HCG BLOOD, ED (MC, WL, AP ONLY)

## 2017-11-23 LAB — BASIC METABOLIC PANEL
Anion gap: 9 (ref 5–15)
BUN: 6 mg/dL (ref 6–20)
CO2: 23 mmol/L (ref 22–32)
CREATININE: 0.66 mg/dL (ref 0.44–1.00)
Calcium: 9.1 mg/dL (ref 8.9–10.3)
Chloride: 107 mmol/L (ref 101–111)
GFR calc Af Amer: >60 mL/min (ref >60)
Glucose, Bld: 93 mg/dL (ref 65–99)
POTASSIUM: 3.2 mmol/L — AB (ref 3.5–5.1)
SODIUM: 139 mmol/L (ref 135–145)

## 2017-11-23 MED ORDER — TRAMADOL HCL 50 MG PO TABS: 50.0000 mg | ORAL_TABLET | Freq: Four times a day (QID) | ORAL | 0 refills | Status: DC | PRN

## 2017-11-23 MED ORDER — ONDANSETRON HCL 4 MG/2ML IJ SOLN
4.0000 mg | Freq: Once | INTRAMUSCULAR | Status: AC
  Administered 2017-11-23: 4 mg via INTRAVENOUS
  Filled 2017-11-23: qty 2

## 2017-11-23 MED ORDER — FENTANYL CITRATE (PF) 100 MCG/2ML IJ SOLN
50.0000 ug | Freq: Once | INTRAMUSCULAR | Status: AC
  Administered 2017-11-23: 50 ug via INTRAVENOUS
  Filled 2017-11-23: qty 2

## 2017-11-23 NOTE — ED Notes (Addendum)
Pt states that she has been having upper back pain radiating to left shoulder for approx two days and then she started experiencing L chest pain approx one day ago. Pt states that pain is not new and she has had it before in the past. Pt A/Ox4; Pt c/o pain 8/10

## 2017-11-23 NOTE — Discharge Instructions (Signed)
You have been diagnosed by your caregiver as having chest wall pain. °SEEK IMMEDIATE MEDICAL ATTENTION IF: °You develop a fever.  °Your chest pains become severe or intolerable.  °You develop new, unexplained symptoms (problems).  °You develop shortness of breath, nausea, vomiting, sweating or feel light headed.  °You develop a new cough or you cough up blood. ° °

## 2017-11-23 NOTE — ED Notes (Signed)
Blood work drawn and sent to lab. Extra sent down as well.

## 2017-11-23 NOTE — ED Notes (Signed)
Patient verbalized understanding of discharge instructions and denies any further needs or questions at this time. VS stable. Patient ambulatory with steady gait.  

## 2017-11-23 NOTE — ED Notes (Signed)
Pt discharged from ED; instructions provided and scripts given; Pt encouraged to return to ED if symptoms worsen and to f/u with PCP; Pt verbalized understanding of all instructions 

## 2017-11-23 NOTE — ED Provider Notes (Signed)
[facility] Provider Note   CSN: [id number] Arrival date & time: 11/23/17  0911     History   Chief Complaint Chief Complaint  Patient presents with  . Chest Pain    HPI Lelan PonsLashonna Clabaugh is a 27 y.o. female who presents the emergency department chief complaint of left shoulder and chest wall pain.  Patient states that she had onset of symptoms about 4 days ago.  Started as pain in her left shoulder blade and progressively worsened to the front of her chest.  It is worse with movement.  She denies pleuritic chest pain, hemoptysis, history of DVTs.  She has a complicated past medical history that includes interstitial lung disease, lupus and [facility] for acute hypoxemic respiratory failure and pneumonia.  She is still on antibiotics and is finishing up Bactrim this week.  Patient denies nausea, diaphoresis.  She has been taking Motrin without relief of her symptoms.  HPI  Past Medical History:  Diagnosis Date  . Arthritis    "hands and legs" (01/08/2015)  . CAP (community acquired pneumonia) 01/07/2015  . Daily headache    "sometimes" (01/08/2015)  . GERD (gastroesophageal reflux disease)   . Hypertension   . Lung disease   . Lupus   . Pulmonary hypertension (HCC)   . Sjogren's syndrome [facility](HCC)     Patient Active Problem List   Diagnosis Date Noted  . Acute respiratory failure with hypoxemia (HCC) 09/19/2017  . Acute respiratory failure with hypoxia (HCC)   . Multifocal pneumonia   . ILD (interstitial lung disease) (HCC)   . History of IUFD 02/14/2017  . 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 Chronic Respiratory failure with oxygen requirement affecting pregnancy, antepartum 05/16/2016  . Chronic hypertension 03/14/2016  . 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 Lupus (systemic lupus erythematosus) (HCC) 08/21/2015  . Loud P2 (pulmonary S2,  second heart sound) 08/21/2015  . Insomnia 08/21/2015  . Systemic lupus erythematosus (SLE) affecting pregnancy, antepartum (HCC) 07/06/2015  . Sjogren's syndrome (HCC) 04/28/2015  . Interstitial lung disease (HCC) 02/06/2015    Past Surgical History:  Procedure Laterality Date  . CARDIAC CATHETERIZATION N/A 09/09/2015   Procedure: Right Heart Cath;  Surgeon: Laurey Moralealton S McLean, MD;  [facility];  Service: Cardiovascular;  Laterality: N/A;  . DILATION AND EVACUATION N/A 07/13/2015   Procedure: DILATATION AND EVACUATION;  Surgeon: Levie HeritageJacob J Stinson, DO;  Location: [facility] ORS;  Service: Gynecology;  Laterality: N/A;  . FINGER SURGERY Right 03/2014   "laceration, nerve/artery injury" 2nd digit  . VIDEO BRONCHOSCOPY Bilateral 01/12/2015   Procedure: VIDEO BRONCHOSCOPY WITH FLUORO;  Surgeon: Leslye Peerobert S Byrum, MD;  [facility];  Service: Cardiopulmonary;  Laterality: Bilateral;    OB History    Gravida Para Term Preterm AB Living   3 2 1 1 1 1    SAB TAB Ectopic Multiple Live Births   1 0 0 0 1       Home Medications    Prior to Admission medications   Medication Sig Start Date End Date Taking? Authorizing Provider  albuterol (PROVENTIL HFA;VENTOLIN HFA) 108 (90 Base) MCG/ACT inhaler Inhale 2 puffs into the lungs every 4 (four) hours as needed for wheezing or shortness of breath. 06/09/17  Yes Adam PhenixArnold, James G, MD  amLODipine (NORVASC) 5 MG tablet Take 1 tablet (5 mg total) by mouth daily. 09/22/17  Yes Duayne CalHoffman, Paul W, NP  diphenhydrAMINE (BENADRYL) 25 mg capsule Take 25 mg by mouth every 4 (four) hours as needed. 09/26/17  Yes [provider]  hydrALAZINE (APRESOLINE) 100 MG tablet Take 200 mg by mouth 2 (two) times daily.   Yes [provider]  hydroxychloroquine (PLAQUENIL) 200 MG tablet Take 400 mg by mouth daily.   Yes [provider]  ibuprofen (ADVIL,MOTRIN) 600 MG tablet Take 1 tablet (600 mg total) by mouth every 6 (six) hours. 09/15/17   Yes Minott, Lynnae Prude, MD  mycophenolate (CELLCEPT) 500 MG tablet Take 1,000 mg by mouth 2 (two) times daily. 10/26/17 04/24/18 Yes [provider]  pantoprazole (PROTONIX) 40 MG tablet Take 1 tablet (40 mg total) by mouth daily. 08/31/17 11/23/17 Yes Adam Phenix, MD  predniSONE (DELTASONE) 5 MG tablet Take 10 mg by mouth daily with breakfast.    Yes [provider]  sertraline (ZOLOFT) 25 MG tablet Take 25 mg by mouth daily. 09/27/17 09/27/18 Yes [provider]  aspirin EC 81 MG tablet Take 1 tablet (81 mg total) by mouth daily. Patient not taking: Reported on 11/23/2017 03/13/17   Lesly Dukes, MD  azaTHIOprine (IMURAN) 50 MG tablet Take 3 tablets (150 mg total) by mouth daily. Patient not taking: Reported on 11/23/2017 09/15/17   Minott, Lynnae Prude, MD  butalbital-acetaminophen-caffeine (FIORICET, ESGIC) [phone] MG tablet Take 1 tablet by mouth every 6 (six) hours as needed for headache. Patient not taking: Reported on 11/23/2017 09/06/17 09/06/18  Moss, [facility], DO  ceFEPIme 2 g in dextrose 5 % 50 mL Inject 2 g into the vein every 12 (twelve) hours. Patient not taking: Reported on 10/25/2017 09/21/17   Duayne Cal, NP  fluticasone [facility]) 50 MCG/ACT nasal spray Place 2 sprays into both nostrils daily. Patient not taking: Reported on 11/23/2017 04/12/17   Rasch, Victorino Dike I, NP  folic acid (FOLVITE) 1 MG tablet Take 1 tablet (1 mg total) by mouth daily. Patient not taking: Reported on 11/23/2017 04/13/17   Allie Bossier, MD  heparin 5000 UNIT/ML injection Inject 1 mL (5,000 Units total) into the skin every 8 (eight) hours. Patient not taking: Reported on 10/25/2017 09/21/17   Duayne Cal, NP  hydrALAZINE (APRESOLINE) 20 MG/ML injection Inject 0.5-1 mLs (10-20 mg total) into the vein every 4 (four) hours as needed (To maintain SBP < 170 mmHg). Patient not taking: Reported on 11/23/2017 09/21/17   Duayne Cal, NP  methylPREDNISolone sodium succinate (SOLU-MEDROL) 125 mg/2 mL  injection Inject 2 mLs (125 mg total) into the vein every 6 (six) hours. Patient not taking: Reported on 10/25/2017 09/22/17   Duayne Cal, NP  Prenatal Vit-Fe Fumarate-FA (PRENATAL COMPLETE) 14-0.4 MG TABS Take 1 tablet by mouth daily. Patient not taking: Reported on 11/23/2017 01/30/17   Shaune Pollack, MD  senna-docusate (SENOKOT-S) 8.6-50 MG tablet Take 2 tablets by mouth daily. Patient not taking: Reported on 11/23/2017 09/16/17   Minott, Lynnae Prude, MD  vancomycin 1,750 mg in sodium chloride 0.9 % 500 mL Inject 1,750 mg into the vein every 8 (eight) hours. Patient not taking: Reported on 11/23/2017 09/22/17   Duayne Cal, NP    Family History Family History  Problem Relation Age of Onset  . Diabetes Mother   . Diabetes Maternal Aunt   . Diabetes Maternal Grandmother     Social History Social History   Tobacco Use  . Smoking status: Former Smoker    Packs/day: 0.10    Years: 5.00    Pack  years: 0.50    Types: Cigarettes    Last attempt to quit: 11/20/2014    Years since quitting: 3.0  . Smokeless tobacco: Never Used  Substance Use Topics  . Alcohol use: No    Alcohol/week: 0.0 oz  . Drug use: No     Allergies   Hydrocodone and Zithromax [azithromycin]   Review of Systems Review of Systems Ten systems reviewed and are negative for acute change, except as noted in the HPI.    Physical Exam Updated Vital Signs BP 128/87 (BP Location: Right Arm)   Pulse 84   Temp 99 F (37.2 C) (Oral)   Resp 20   LMP 11/21/2017 Comment: implant  SpO2 99%   Physical Exam  Constitutional: She is oriented to person, place, and time. She appears well-developed and well-nourished. No distress.  HENT:  Head: Normocephalic and atraumatic.  Eyes: Conjunctivae and EOM are normal. Pupils are equal, round, and reactive to light. No scleral icterus.  Neck: Normal range of motion.  Cardiovascular: Normal rate, regular rhythm and normal heart sounds. Exam reveals no gallop and no  friction rub.  No murmur heard. Pulmonary/Chest: Effort normal and breath sounds normal. No respiratory distress.  Abdominal: Soft. Bowel sounds are normal. She exhibits no distension and no mass. There is no tenderness. There is no guarding.  Musculoskeletal:  Reproducible pain with palpation of the left shoulder shoulder region and anteriorly to the lateral pectoralis region.  Full range of motion of the shoulder.  Neurological: She is alert and oriented to person, place, and time.  Skin: Skin is warm and dry. She is not diaphoretic.  Psychiatric: Her behavior is normal.  Nursing note and vitals reviewed.    ED Treatments / Results  Labs (all labs ordered are listed, but only abnormal results are displayed) Labs Reviewed  BASIC METABOLIC PANEL - Abnormal; Notable for the following components:      Result Value   Potassium 3.2 (*)    All other components within normal limits  CBC - Abnormal; Notable for the following components:   RBC 5.15 (*)    All other components within normal limits  D-DIMER, QUANTITATIVE (NOT AT [facility])  I-STAT TROPONIN, ED  I-STAT BETA HCG BLOOD, ED ([facility], [facility], [facility] ONLY)  I-STAT TROPONIN, ED    EKG  EKG Interpretation None       Radiology Dg Chest 2 View  Result Date: 11/23/2017 CLINICAL DATA:  Low back and posterior shoulder pain for the past 3-4 days. EXAM: CHEST  2 VIEW COMPARISON:  Portable chest x-ray of September 21, 2017 and June 04, 2017. FINDINGS: The lungs are adequately inflated. There are chronically increased interstitial densities in the mid lung fields bilaterally. The interstitial markings elsewhere are coarse. The heart is normal in size. The pulmonary vascularity is not engorged. There is no pneumothorax or pleural effusion. The bony thorax exhibits no acute abnormality. IMPRESSION: Chronically increased lung markings bilaterally consistent with scarring-fibrosis. No acute pneumonia nor overt evidence of CHF. One cannot exclude acute  bronchitis in the appropriate clinical setting. Electronically Signed   By: David  [place] M.D.   On: 11/23/2017 10:14    Procedures Procedures (including critical care time)  Medications Ordered in ED Medications - No data to display   Initial Impression / Assessment and Plan / ED Course  I have reviewed the triage vital signs and the nursing notes.  Pertinent labs & imaging results that were available during my care of the patient were reviewed by  me and considered in my medical decision making (see chart for details).     Patient with reproducible chest pain.  This is likely musculoskeletal, worse with movement, she is persistently tachycardic but states that is normal for her.  Her chest x-ray shows chronic interstitial lung disease and no evidence of pneumonia.  EKG is unchanged from previous, negative troponin and her d-dimer is negative.  Do not think she has any acute emergent cause of her symptoms today.  Patient will be discharged with tramadol she may continue taking Motrin or Tylenol.  I discussed the case with Dr. Fredderick Phenix.  She appears appropriate for discharge at this time Final Clinical Impressions(s) / ED Diagnoses   Final diagnoses:  None    ED Discharge Orders    None       Arthor Captain, PA-C 11/23/17 1938    Rolan Bucco, MD 11/23/17 2315

## 2017-11-23 NOTE — ED Notes (Signed)
PA at the bedside.

## 2017-11-23 NOTE — ED Triage Notes (Signed)
Pt reports chest, back and SOB for 3 days. Pt reports worsening with movement. Pt denies any other symptoms. Reports that pain is constant.

## 2017-12-21 ENCOUNTER — Other Ambulatory Visit: Payer: Self-pay | Admitting: Family Medicine

## 2017-12-21 ENCOUNTER — Other Ambulatory Visit: Payer: Self-pay | Admitting: Obstetrics and Gynecology

## 2018-02-01 IMAGING — CR DG CHEST 2V
2 series · 2 of 2 positions shown · non-contrast
Comparison: Chest radiograph 06/04/2017

CLINICAL DATA: Altered mental status

EXAM:
CHEST  2 VIEW

[chest pa]
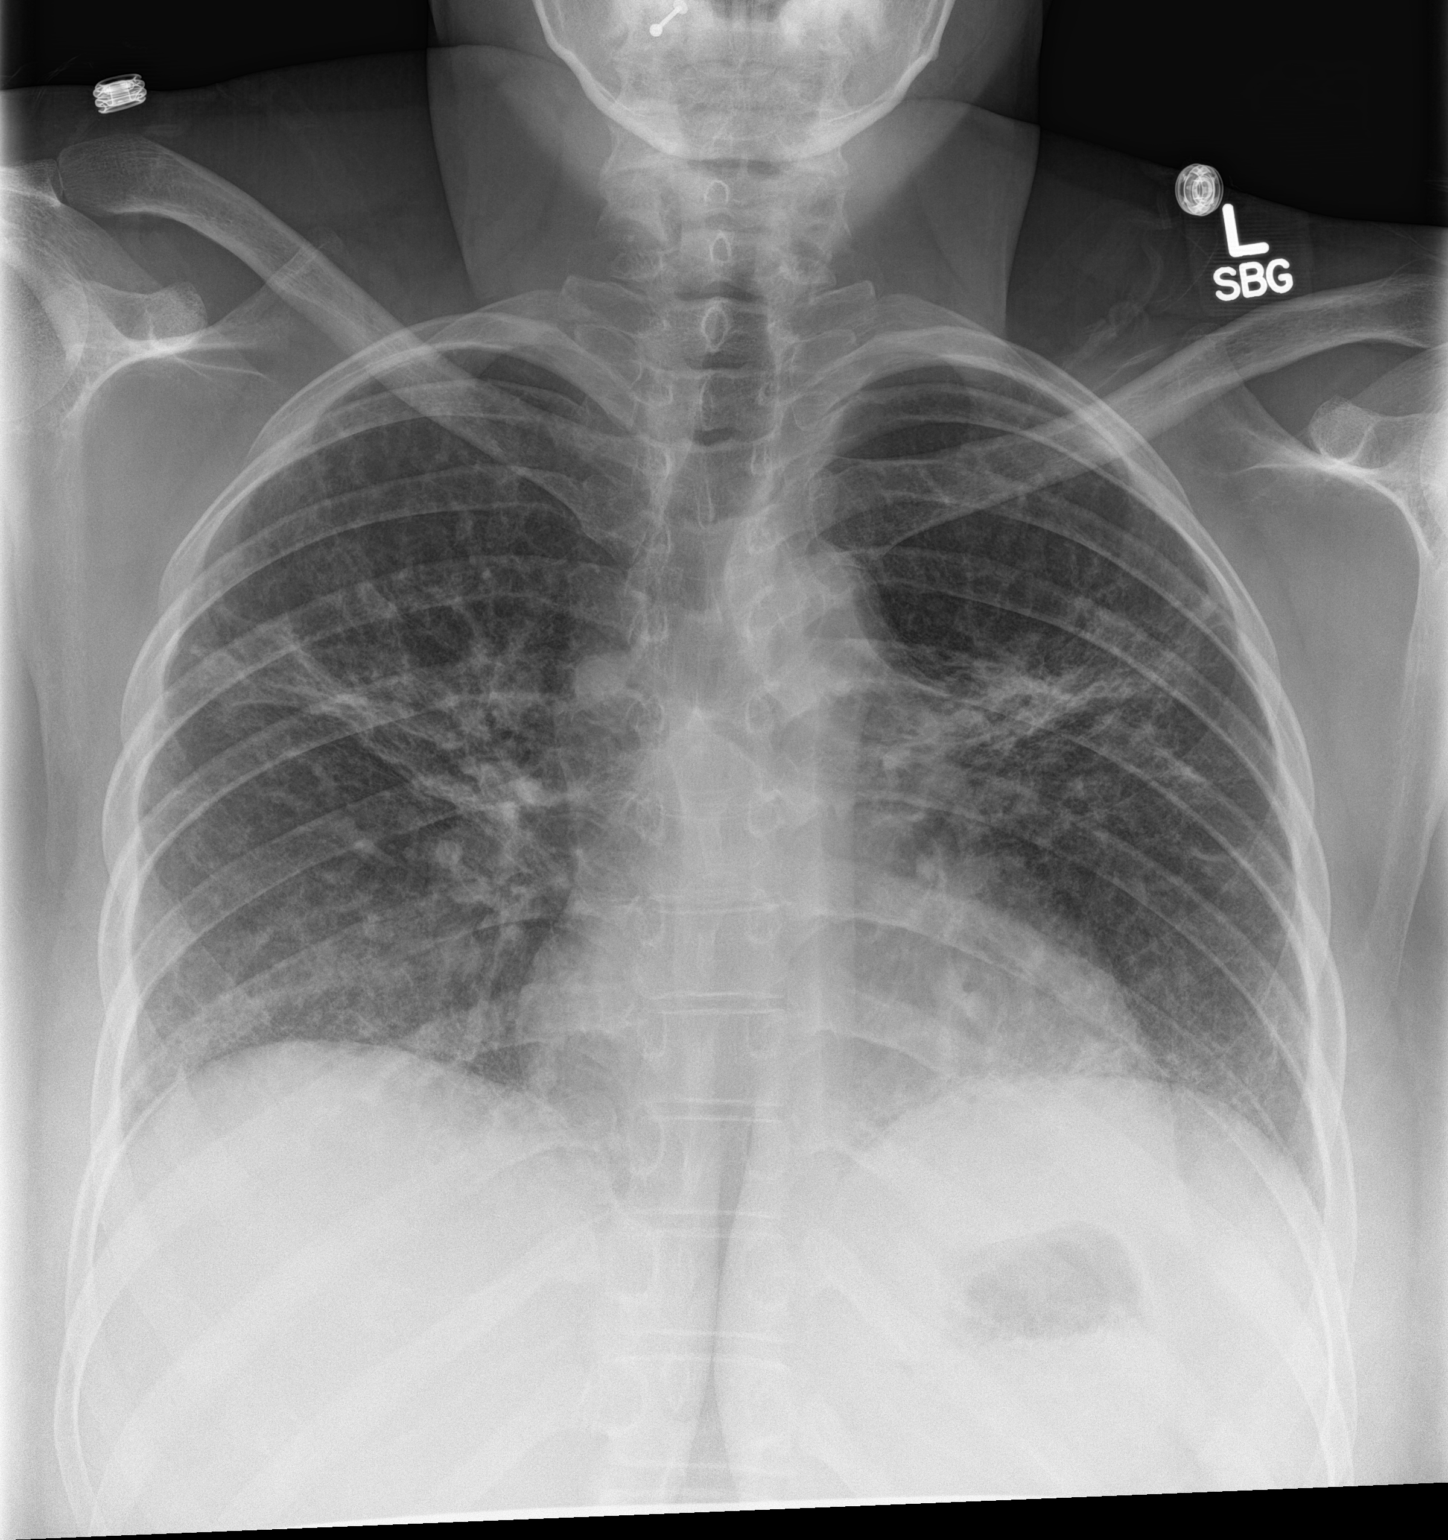

[chest lat]
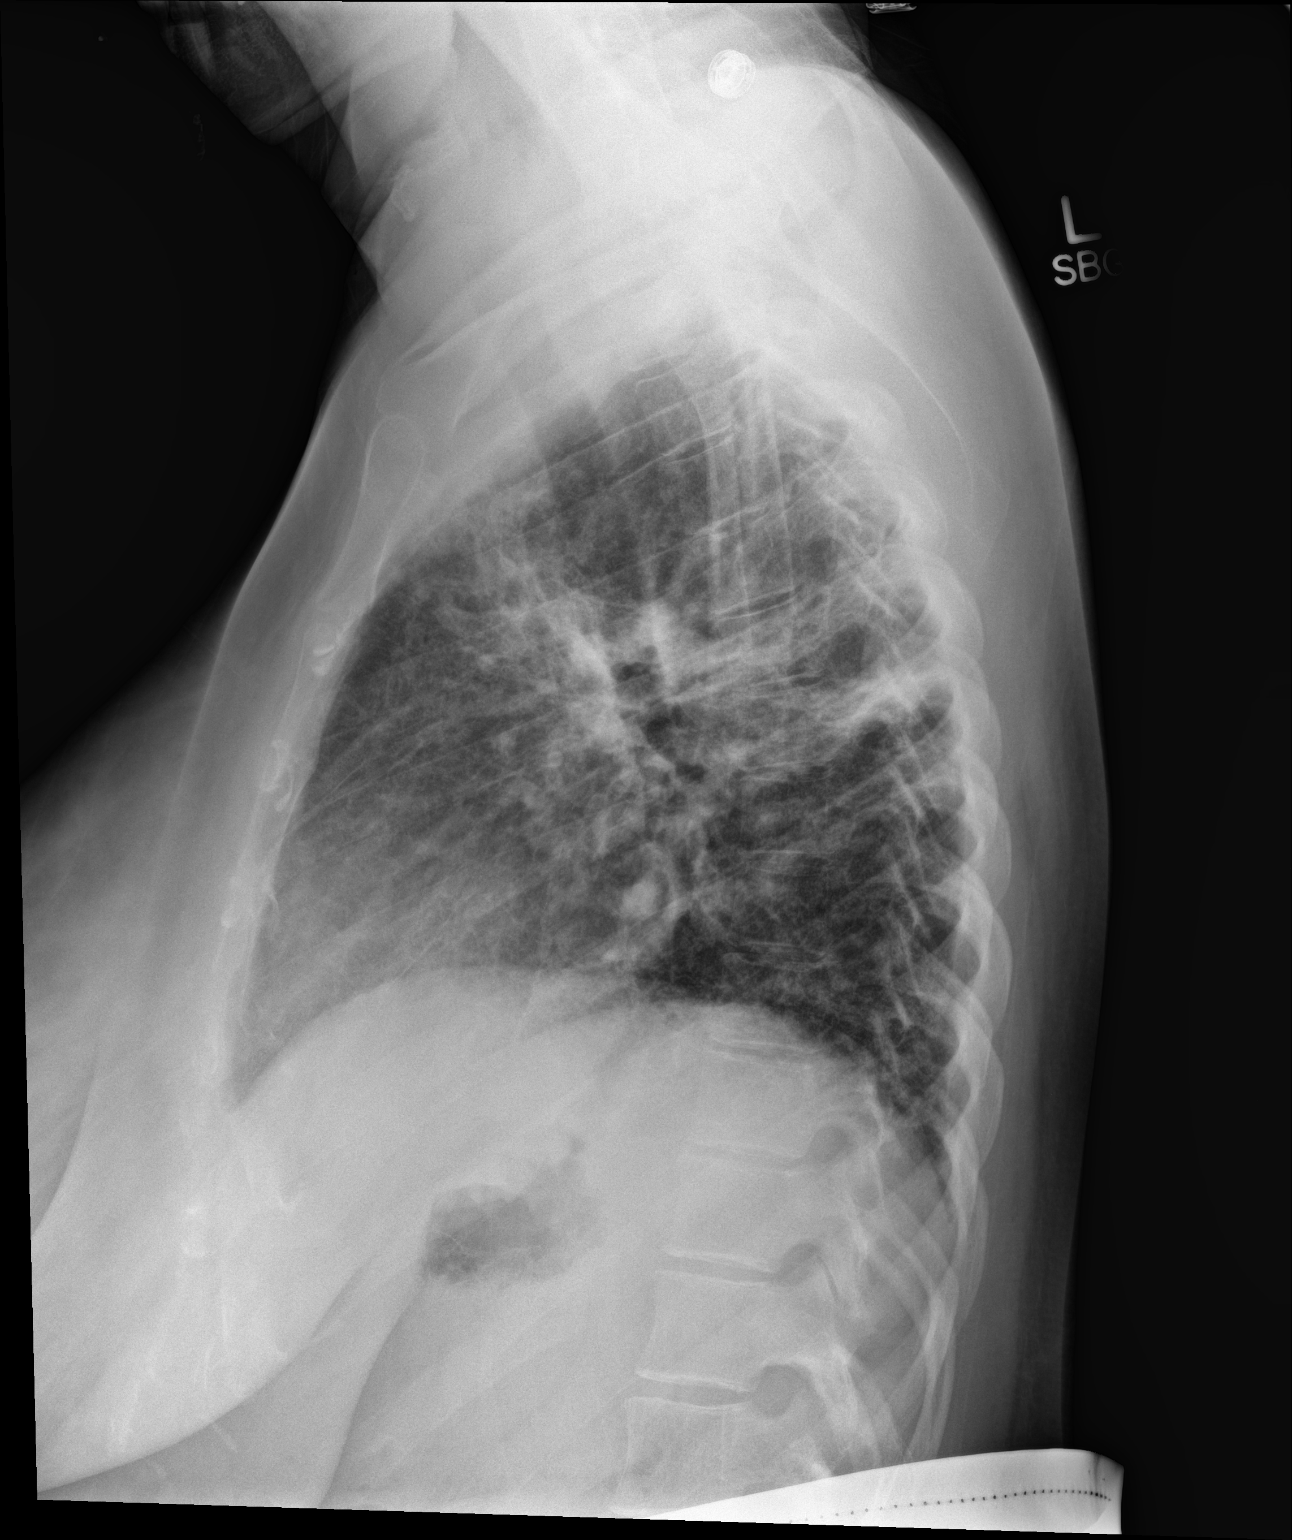

[2 of 2 positions shown; findings below may reference images not displayed]

FINDINGS: Bandlike opacities within both mid lungs are unchanged. No new area
of consolidation. No pleural effusion or pneumothorax.
IMPRESSION: Unchanged appearance bandlike opacities in both mid lungs secondary
to underlying chronic interstitial lung disease. No acute
abnormality.

## 2018-02-16 ENCOUNTER — Other Ambulatory Visit: Payer: Self-pay | Admitting: Family Medicine

## 2018-02-16 ENCOUNTER — Encounter: Payer: Self-pay | Admitting: Family Medicine

## 2018-02-19 ENCOUNTER — Other Ambulatory Visit: Payer: Self-pay | Admitting: General Practice

## 2018-02-19 MED ORDER — BUTALBITAL-APAP-CAFFEINE 50-325-40 MG PO TABS: 1.0000 | ORAL_TABLET | Freq: Four times a day (QID) | ORAL | 0 refills | Status: DC | PRN

## 2018-03-01 IMAGING — DX DG CHEST 1V PORT
1 series · 1 of 1 positions shown · non-contrast
Comparison: [DATE] [DATE], [DATE], [DATE] [DATE], [DATE]

CLINICAL DATA: Shortness breath and cough starting today

EXAM:
PORTABLE CHEST 1 VIEW

[chest ap]
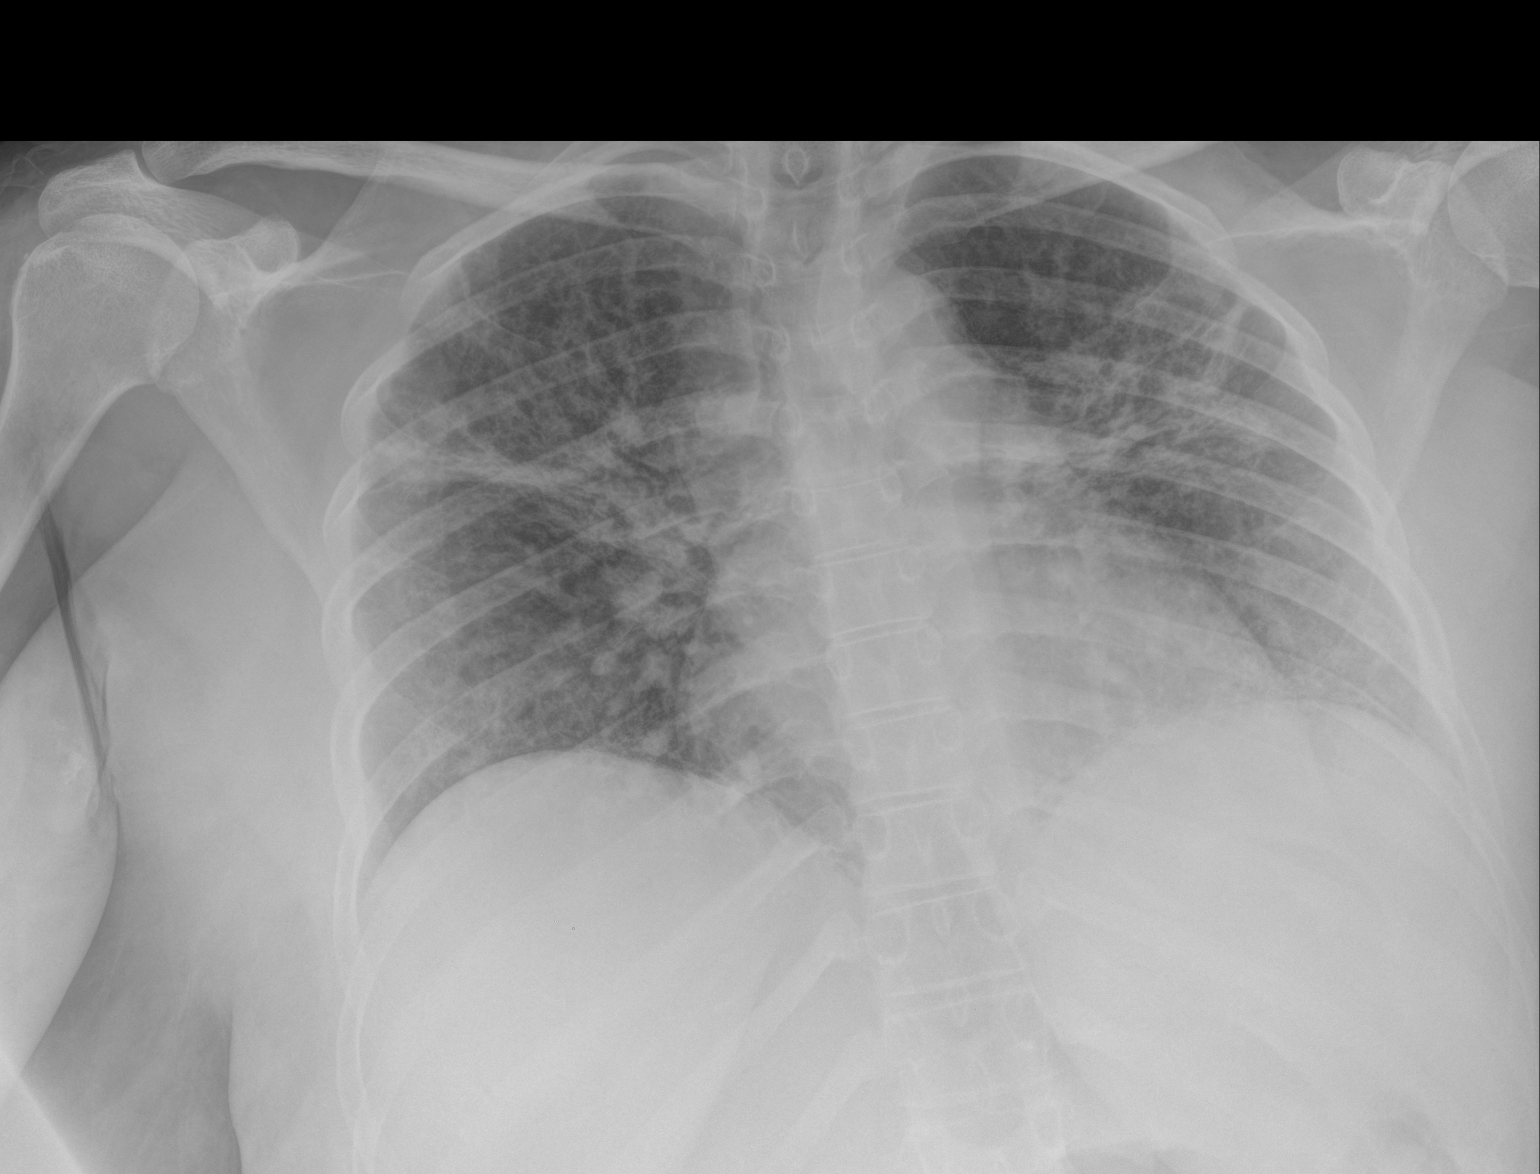

[1 of 1 positions shown; findings below may reference images not displayed]

FINDINGS: The heart size and mediastinal contours are within normal limits.
Plan like opacities are identified in bilateral mid lung unchanged.
Increased pulmonary interstitium is identified bilaterally at
probably chronic. The visualized skeletal structures are
unremarkable.
IMPRESSION: Stable diffuse increased interstitium and unchanged appearance of
than like opacities in bilateral mid lung likely secondary to
chronic interstitial lung disease. No acute abnormality is noted.

## 2018-04-12 IMAGING — US US MFM FETAL BPP W/O NON-STRESS
1 series · 15 of 28 positions shown · non-contrast
Comparison: none

[Series 1: us mfm fetal bpp w/o non-stress · 38 acquisitions, 15 frames shown]
[im 1/38]
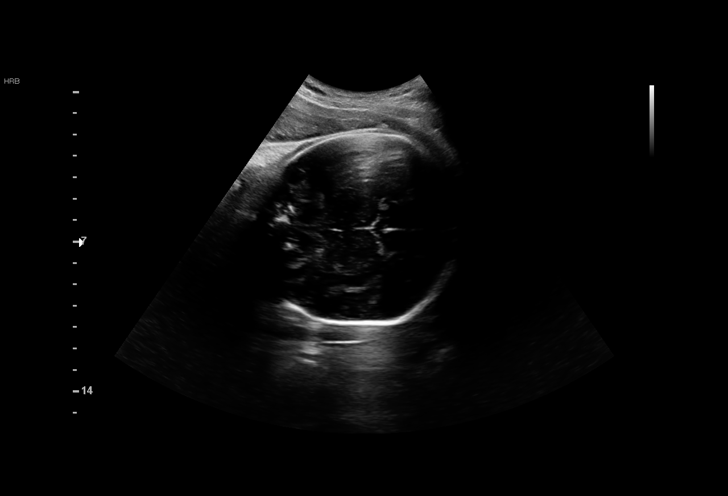
[im 3/38]
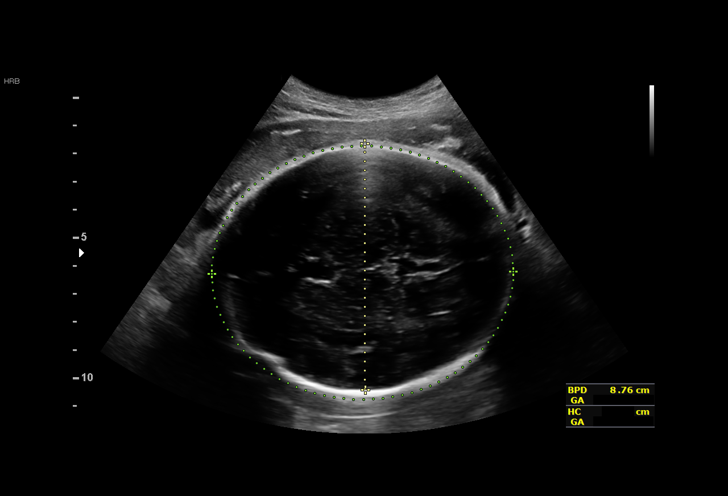
[im 6/38]
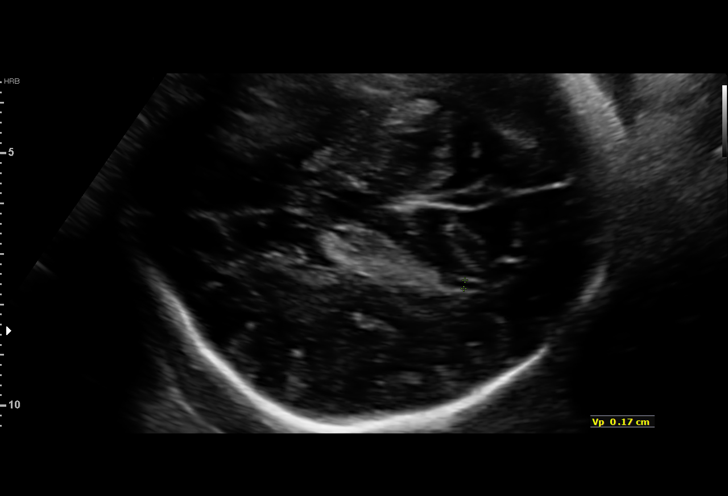
[im 9/38]
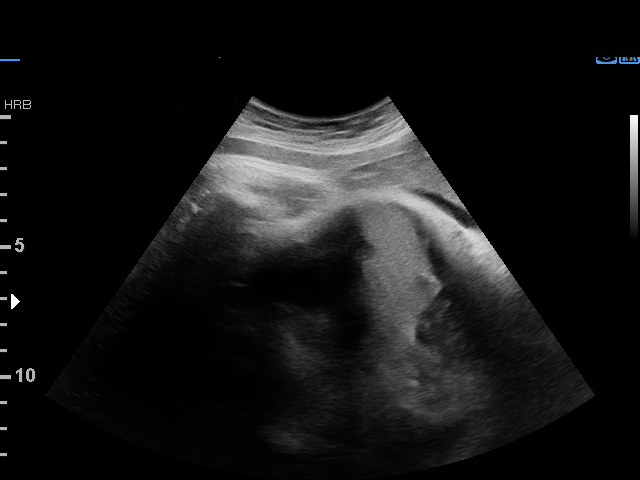
[im 11/38]
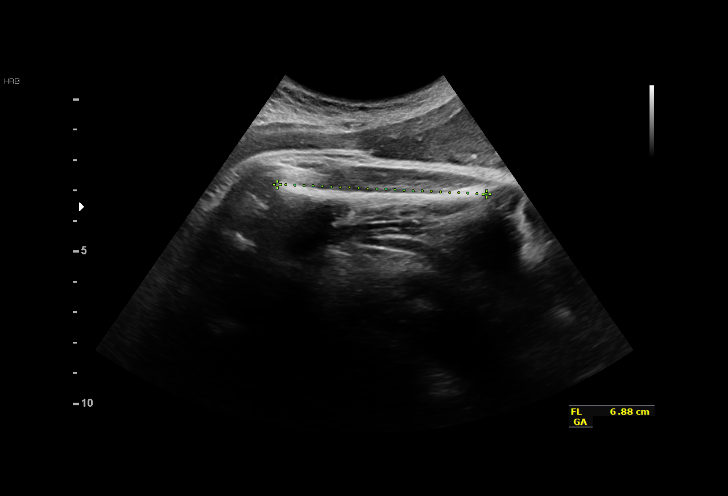
[im 14/38]
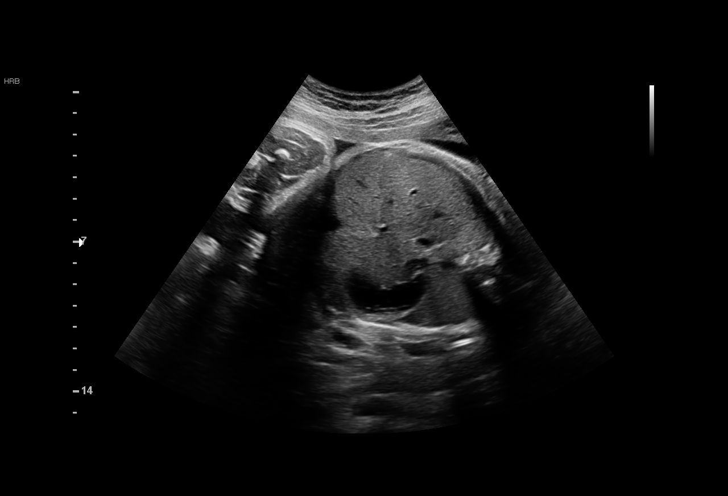
[im 17/38]
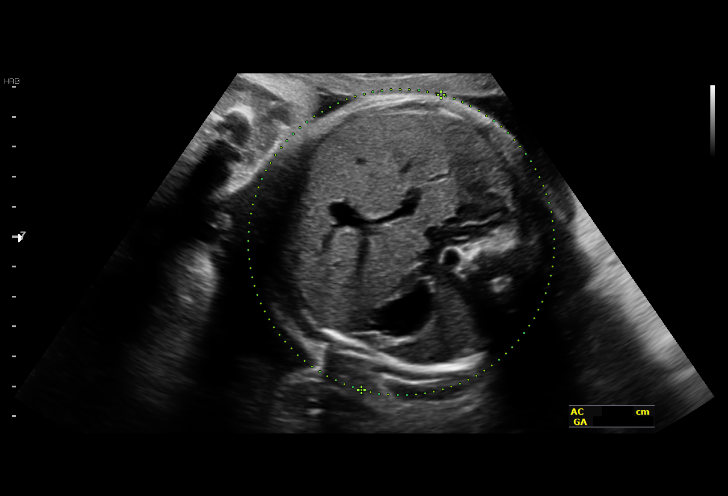
[im 20/38]
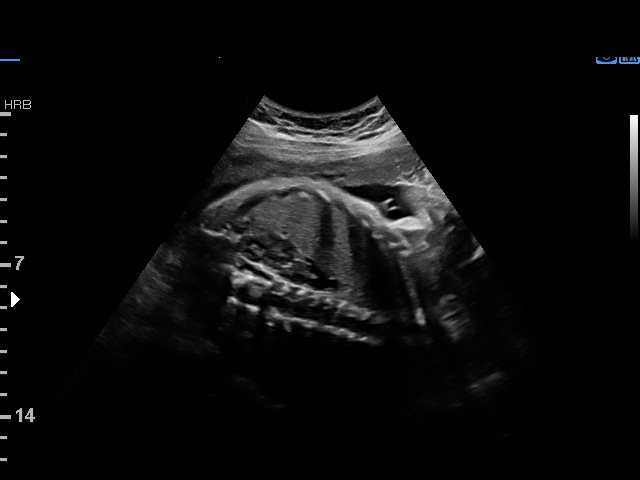
[im 21/38]
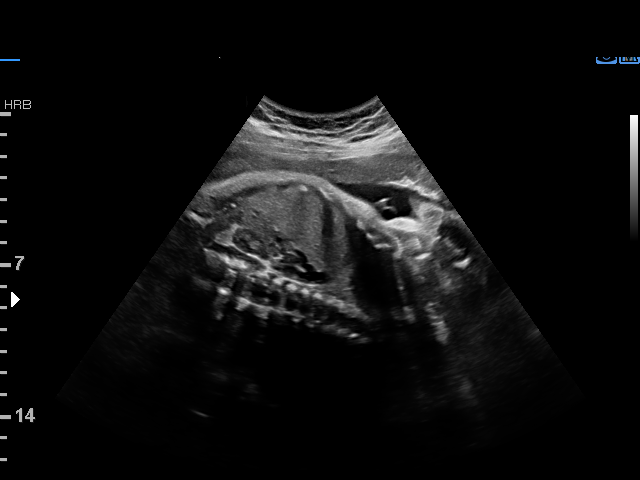
[im 24/38]
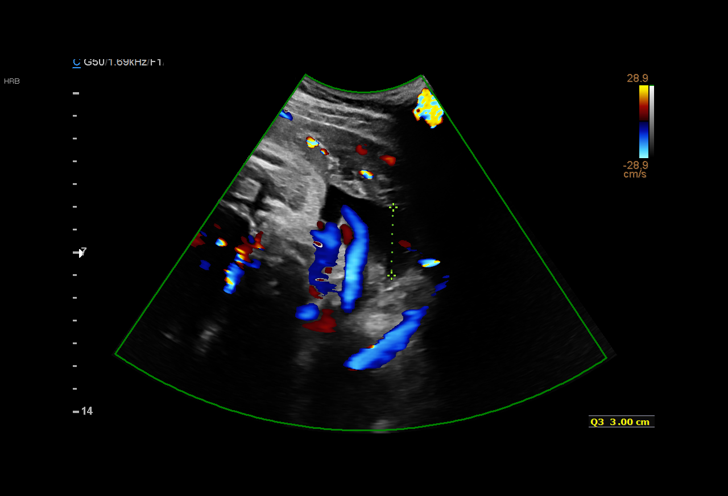
[im 27/38]
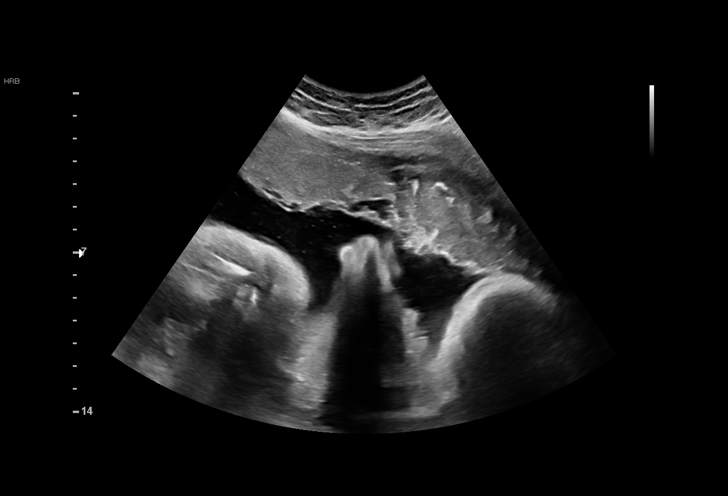
[im 29/38]
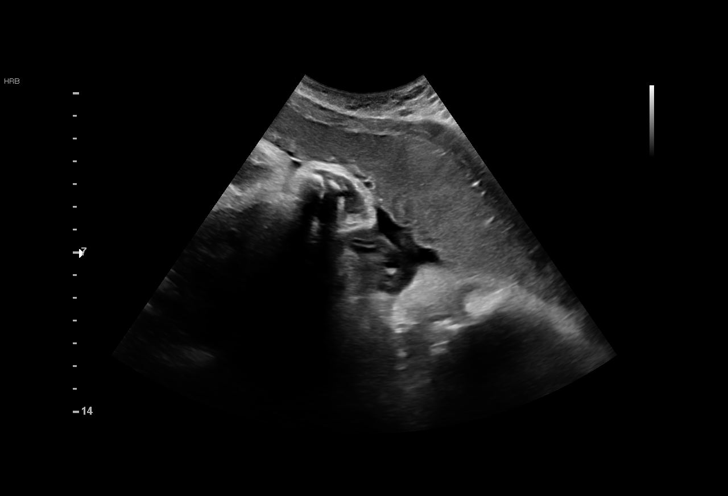
[im 32/38]
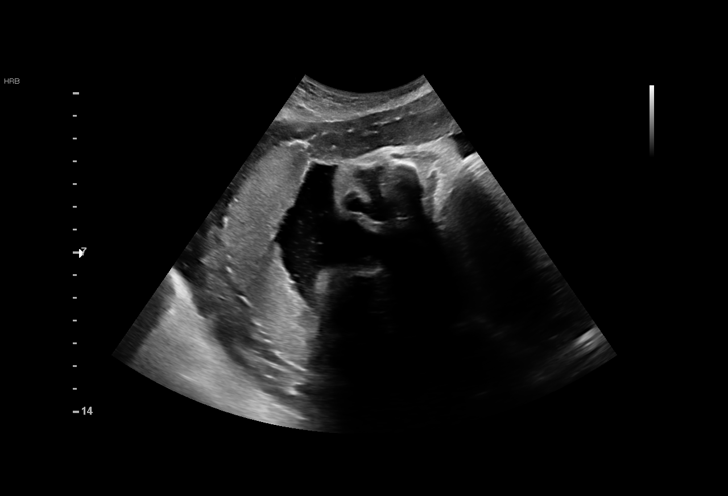
[im 35/38]
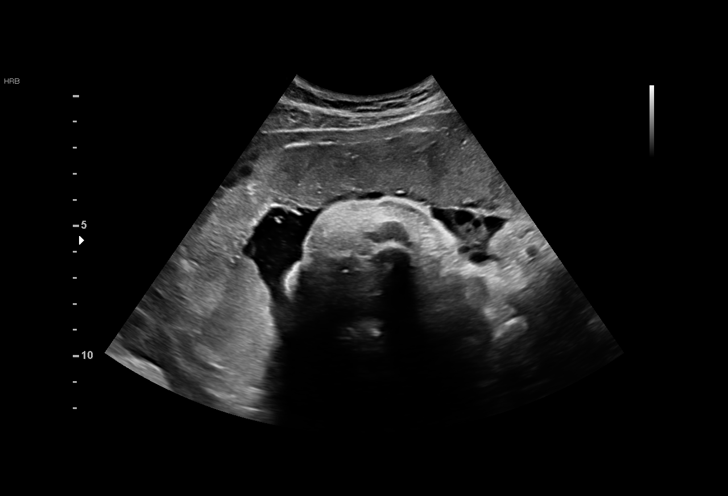
[im 38/38]
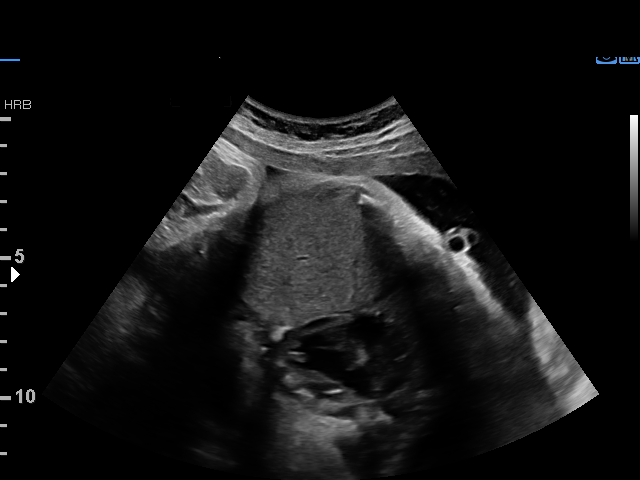

[15 of 28 positions shown; findings below may reference images not displayed]

OB/Gyn Clinic
MAU/Triage

1  KELLA THORNE            [id number]      [id number]     [id number]
Indications

36 weeks gestation of pregnancy
Poor obstetric history: Previous IUFD
(stillbirth)
Systemic lupus complicating pregnancy,         O26.893,
third trimester
Hypertension - Chronic
OB History

Blood Type:            Height:  5'5"   Weight (lb):  173       BMI:
Gravidity:    3         Term:   1        Prem:   0        SAB:   1
TOP:          0       Ectopic:  0        Living: 0
Fetal Evaluation

Num Of Fetuses:     1
Fetal Heart         136
Rate(bpm):
Cardiac Activity:   Observed
Presentation:       Cephalic
Placenta:           Anterior, above cervical os
P. Cord Insertion:  Previously Visualized
Amniotic Fluid
AFI FV:      Subjectively within normal limits

AFI Sum(cm)     %Tile       Largest Pocket(cm)
15.57           58

RUQ(cm)       RLQ(cm)       LUQ(cm)        LLQ(cm)
5.35          3.6           3.62           3
Biophysical Evaluation

Amniotic F.V:   Within normal limits       F. Tone:        Observed
F. Movement:    Observed                   Score:          [DATE]
F. Breathing:   Observed
Biometry

BPD:      88.1  mm     G. Age:  35w 4d         40  %    CI:        79.91   %    70 - 86
FL/HC:      22.1   %    20.1 -
HC:      311.4  mm     G. Age:  34w 6d          4  %    HC/AC:      0.97        0.93 -
AC:      319.6  mm     G. Age:  35w 6d         49  %    FL/BPD:     78.0   %    71 - 87
FL:       68.7  mm     G. Age:  35w 2d         22  %    FL/AC:      21.5   %    20 - 24
HUM:      58.6  mm     G. Age:  33w 6d         24  %

Est. FW:    0508  gm           6 lb     50  %
Gestational Age

LMP:           36w 2d        Date:  12/27/16                 EDD:   10/03/17
U/S Today:     35w 3d                                        EDD:   10/09/17
Best:          36w 2d     Det. By:  LMP  (12/27/16)          EDD:   10/03/17
Anatomy

Cranium:               Appears normal         Thoracic:               Appears normal
Cavum:                 Appears normal         Stomach:                Appears normal, left
sided
Ventricles:            Appears normal         Bladder:                Appears normal
Cervix Uterus Adnexa

Cervix
Not visualized (advanced GA >80wks)
Impression

Single living intrauterine pregnancy at 36w 2d.
Cephalic presentation.
Placenta Anterior, above cervical os.
Amniotic fluid volume subjectively within normal limits.
Appropriate interval fetal growth in the 50th percentile
Normal interval fetal anatomy.
BPP [DATE].
Recommendations

Continue antenatal testing with clinic until delivery. Follow-up
ultrasounds as clinically indicated.

## 2018-04-19 ENCOUNTER — Other Ambulatory Visit: Payer: Self-pay

## 2018-04-19 ENCOUNTER — Encounter (HOSPITAL_COMMUNITY): Payer: Self-pay | Admitting: *Deleted

## 2018-04-19 ENCOUNTER — Emergency Department (HOSPITAL_COMMUNITY)
Admission: EM | Admit: 2018-04-19 | Discharge: 2018-04-20 | Disposition: A | Discharge status: Home / Self Care | Payer: Medicare Other | Attending: Emergency Medicine | Admitting: Emergency Medicine

## 2018-04-19 ENCOUNTER — Emergency Department (HOSPITAL_COMMUNITY): Payer: Medicare Other

## 2018-04-19 DIAGNOSIS — Z79899 Other long term (current) drug therapy: Secondary | ICD-10-CM | POA: Insufficient documentation

## 2018-04-19 DIAGNOSIS — M549 Dorsalgia, unspecified: Secondary | ICD-10-CM | POA: Diagnosis present

## 2018-04-19 DIAGNOSIS — M329 Systemic lupus erythematosus, unspecified: Secondary | ICD-10-CM

## 2018-04-19 DIAGNOSIS — M25511 Pain in right shoulder: Secondary | ICD-10-CM | POA: Insufficient documentation

## 2018-04-19 DIAGNOSIS — Z8739 Personal history of other diseases of the musculoskeletal system and connective tissue: Secondary | ICD-10-CM | POA: Diagnosis not present

## 2018-04-19 DIAGNOSIS — I10 Essential (primary) hypertension: Secondary | ICD-10-CM | POA: Diagnosis not present

## 2018-04-19 DIAGNOSIS — Z87891 Personal history of nicotine dependence: Secondary | ICD-10-CM | POA: Diagnosis not present

## 2018-04-19 DIAGNOSIS — IMO0002 Reserved for concepts with insufficient information to code with codable children: Secondary | ICD-10-CM

## 2018-04-19 DIAGNOSIS — M25519 Pain in unspecified shoulder: Secondary | ICD-10-CM

## 2018-04-19 LAB — COMPREHENSIVE METABOLIC PANEL
ALT: 16 U/L (ref 14–54)
AST: 18 U/L (ref 15–41)
Albumin: 3.9 g/dL (ref 3.5–5.0)
Alkaline Phosphatase: 84 U/L (ref 38–126)
Anion gap: 7 (ref 5–15)
BUN: 7 mg/dL (ref 6–20)
CO2: 25 mmol/L (ref 22–32)
CREATININE: 0.72 mg/dL (ref 0.44–1.00)
Calcium: 9 mg/dL (ref 8.9–10.3)
Chloride: 106 mmol/L (ref 101–111)
GFR calc Af Amer: >60 mL/min (ref >60)
GFR calc non Af Amer: >60 mL/min (ref >60)
Glucose, Bld: 107 mg/dL — ABNORMAL HIGH (ref 65–99)
Potassium: 4 mmol/L (ref 3.5–5.1)
SODIUM: 138 mmol/L (ref 135–145)
Total Bilirubin: 0.6 mg/dL (ref 0.3–1.2)
Total Protein: 7 g/dL (ref 6.5–8.1)

## 2018-04-19 LAB — URINALYSIS, ROUTINE W REFLEX MICROSCOPIC
Bilirubin Urine: NEGATIVE
Glucose, UA: NEGATIVE mg/dL
KETONES UR: NEGATIVE mg/dL
Leukocytes, UA: NEGATIVE
Nitrite: NEGATIVE
PH: 5 (ref 5.0–8.0)
Protein, ur: NEGATIVE mg/dL
SPECIFIC GRAVITY, URINE: 1.018 (ref 1.005–1.030)

## 2018-04-19 LAB — I-STAT BETA HCG BLOOD, ED (MC, WL, AP ONLY): I-stat hCG, quantitative: <5 m[IU]/mL (ref <5)

## 2018-04-19 LAB — CBC
HCT: 43.1 % (ref 36.0–46.0)
Hemoglobin: 13.6 g/dL (ref 12.0–15.0)
MCH: 26.1 pg (ref 26.0–34.0)
MCHC: 31.6 g/dL (ref 30.0–36.0)
MCV: 82.6 fL (ref 78.0–100.0)
PLATELETS: 255 10*3/uL (ref 150–400)
RBC: 5.22 MIL/uL — ABNORMAL HIGH (ref 3.87–5.11)
RDW: 13 % (ref 11.5–15.5)
WBC: 5.8 10*3/uL (ref 4.0–10.5)

## 2018-04-19 LAB — LIPASE, BLOOD: Lipase: 29 U/L (ref 11–51)

## 2018-04-19 NOTE — ED Triage Notes (Signed)
Pt also reports diarrhea and vaginal discharge

## 2018-04-19 NOTE — ED Notes (Signed)
Called to pt by request, pt reports productive cough x 1 week, reports "it feels like fluid is building up in my lungs, like my lupus flare-up".

## 2018-04-19 NOTE — ED Triage Notes (Signed)
Pt in c/o right shoulder and back pain, worse with movement, unknown injury, pain started yesterday

## 2018-04-20 DIAGNOSIS — M25511 Pain in right shoulder: Secondary | ICD-10-CM | POA: Diagnosis not present

## 2018-04-20 MED ORDER — TRAMADOL HCL 50 MG PO TABS
50.0000 mg | ORAL_TABLET | Freq: Once | ORAL | Status: AC
  Administered 2018-04-20: 50 mg via ORAL
  Filled 2018-04-20: qty 1

## 2018-04-20 MED ORDER — TRAMADOL HCL 50 MG PO TABS: 50.0000 mg | ORAL_TABLET | Freq: Four times a day (QID) | ORAL | 0 refills | Status: DC | PRN

## 2018-04-20 NOTE — Discharge Instructions (Addendum)
Tramadol as prescribed as needed for pain.  Follow-up with your physicians on the third as scheduled, and return to the ER if you develop difficulty breathing, or other new and concerning symptoms.

## 2018-04-20 NOTE — ED Notes (Signed)
ED Provider at bedside. 

## 2018-04-20 NOTE — ED Provider Notes (Signed)
[facility] EMERGENCY DEPARTMENT Provider Note   CSN: [id number] Arrival date & time: 04/19/18  1819     History   Chief Complaint Chief Complaint  Patient presents with  . Back Pain  . Shoulder Pain    HPI Kayla [name] is a 27 y.o. female.  Patient is a 27 year old female with past medical history of lupus, pulmonary hypertension, and Sjogren's syndrome.  She presents today for evaluation of a possible lupus flare.  She reports pain in her back and shoulder which is worse with movement.  She also feels as though fluid may be building in her lungs.  She denies to me that she is short of breath, but does report a "rattling"sensation in her chest.  She denies productive cough, fevers, or chills.  She does have a prior admission for respiratory failure requiring intubation.  This occurred shortly after childbirth.  The history is provided by the patient.  Back Pain   This is a recurrent problem. The current episode started 2 days ago. The problem occurs constantly. The problem has been gradually worsening. The pain is associated with no known injury. The pain does not radiate. The pain is moderate.  Shoulder Pain      Past Medical History:  Diagnosis Date  . Arthritis    "hands and legs" (01/08/2015)  . CAP (community acquired pneumonia) 01/07/2015  . Daily headache    "sometimes" (01/08/2015)  . GERD (gastroesophageal reflux disease)   . Hypertension   . Lung disease   . Lupus (HCC)   . Pulmonary hypertension (HCC)   . Sjogren's syndrome [facility])     Patient Active Problem List   Diagnosis Date Noted  . Acute respiratory failure with hypoxemia (HCC) 09/19/2017  . Acute respiratory failure with hypoxia (HCC)   . Multifocal pneumonia   . ILD (interstitial lung disease) (HCC)   . History of IUFD 02/14/2017  . 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 Chronic Respiratory failure with oxygen requirement affecting pregnancy,  antepartum 05/16/2016  . Chronic hypertension 03/14/2016  . 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 Lupus (systemic lupus erythematosus) (HCC) 08/21/2015  . Loud P2 (pulmonary S2, second heart sound) 08/21/2015  . Insomnia 08/21/2015  . Systemic lupus erythematosus (SLE) affecting pregnancy, antepartum (HCC) 07/06/2015  . Sjogren's syndrome (HCC) 04/28/2015  . Interstitial lung disease (HCC) 02/06/2015    Past Surgical History:  Procedure Laterality Date  . CARDIAC CATHETERIZATION N/A 09/09/2015   Procedure: Right Heart Cath;  Surgeon: Laurey Morale, MD;  Location: [facility] INVASIVE CV LAB;  Service: Cardiovascular;  Laterality: N/A;  . DILATION AND EVACUATION N/A 07/13/2015   Procedure: DILATATION AND EVACUATION;  Surgeon: Levie Heritage, DO;  Location: [facility] ORS;  Service: Gynecology;  Laterality: N/A;  . FINGER SURGERY Right 03/2014   "laceration, nerve/artery injury" 2nd digit  . VIDEO BRONCHOSCOPY Bilateral 01/12/2015   Procedure: VIDEO BRONCHOSCOPY WITH FLUORO;  Surgeon: Leslye Peer, MD;  Location: [facility] ENDOSCOPY;  Service: Cardiopulmonary;  Laterality: Bilateral;     OB History    Gravida  3   Para  2   Term  1   Preterm  1   AB  1   Living  1     SAB  1   TAB  0   Ectopic  0   Multiple  0   Live Births  1            Home Medications  Prior to Admission medications   Medication Sig Start Date End Date Taking? Authorizing Provider  albuterol (PROVENTIL HFA;VENTOLIN HFA) 108 (90 Base) MCG/ACT inhaler Inhale 2 puffs into the lungs every 4 (four) hours as needed for wheezing or shortness of breath. 06/09/17   Adam Phenix, MD  amLODipine (NORVASC) 5 MG tablet Take 1 tablet (5 mg total) by mouth daily. 09/22/17   Duayne Cal, NP  aspirin EC 81 MG tablet Take 1 tablet (81 mg total) by mouth daily. Patient not taking: Reported on 11/23/2017 03/13/17   Lesly Dukes, MD  azaTHIOprine (IMURAN) 50 MG tablet Take 3 tablets (150 mg total) by  mouth daily. Patient not taking: Reported on 11/23/2017 09/15/17   Minott, Lynnae Prude, MD  butalbital-acetaminophen-caffeine (FIORICET, ESGIC) [phone] MG tablet Take 1 tablet by mouth every 6 (six) hours as needed for headache. 02/19/18 02/19/19  Nicholaus Bloom C, MD  ceFEPIme 2 g in dextrose 5 % 50 mL Inject 2 g into the vein every 12 (twelve) hours. Patient not taking: Reported on 10/25/2017 09/21/17   Duayne Cal, NP  diphenhydrAMINE (BENADRYL) 25 mg capsule Take 25 mg by mouth every 4 (four) hours as needed. 09/26/17   [provider]  fluticasone (FLONASE) 50 MCG/ACT nasal spray Place 2 sprays into both nostrils daily. Patient not taking: Reported on 11/23/2017 04/12/17   Rasch, Victorino Dike I, NP  folic acid (FOLVITE) 1 MG tablet Take 1 tablet (1 mg total) by mouth daily. Patient not taking: Reported on 11/23/2017 04/13/17   Allie Bossier, MD  heparin 5000 UNIT/ML injection Inject 1 mL (5,000 Units total) into the skin every 8 (eight) hours. Patient not taking: Reported on 10/25/2017 09/21/17   Duayne Cal, NP  hydrALAZINE (APRESOLINE) 100 MG tablet Take 200 mg by mouth 2 (two) times daily.    [provider]  hydrALAZINE (APRESOLINE) 20 MG/ML injection Inject 0.5-1 mLs (10-20 mg total) into the vein every 4 (four) hours as needed (To maintain SBP < 170 mmHg). Patient not taking: Reported on 11/23/2017 09/21/17   Duayne Cal, NP  hydroxychloroquine (PLAQUENIL) 200 MG tablet Take 400 mg by mouth daily.    [provider]  ibuprofen (ADVIL,MOTRIN) 600 MG tablet Take 1 tablet (600 mg total) by mouth every 6 (six) hours. 09/15/17   Minott, Lynnae Prude, MD  methylPREDNISolone sodium succinate (SOLU-MEDROL) 125 mg/2 mL injection Inject 2 mLs (125 mg total) into the vein every 6 (six) hours. Patient not taking: Reported on 10/25/2017 09/22/17   Duayne Cal, NP  mycophenolate (CELLCEPT) 500 MG tablet Take 1,000 mg by mouth 2 (two) times daily. 10/26/17 04/24/18  [provider]    pantoprazole (PROTONIX) 40 MG tablet Take 1 tablet (40 mg total) by mouth daily. 08/31/17 11/23/17  Adam Phenix, MD  predniSONE (DELTASONE) 5 MG tablet Take 10 mg by mouth daily with breakfast.     [provider]  Prenatal Vit-Fe Fumarate-FA (PRENATAL COMPLETE) 14-0.4 MG TABS Take 1 tablet by mouth daily. Patient not taking: Reported on 11/23/2017 01/30/17   Shaune Pollack, MD  senna-docusate (SENOKOT-S) 8.6-50 MG tablet Take 2 tablets by mouth daily. Patient not taking: Reported on 11/23/2017 09/16/17   Minott, Lynnae Prude, MD  sertraline (ZOLOFT) 25 MG tablet Take 25 mg by mouth daily. 09/27/17 09/27/18  [provider]  traMADol (ULTRAM) 50 MG tablet Take 1 tablet (50 mg total) by mouth every 6 (six) hours as needed. 11/23/17   Arthor Captain, PA-C  vancomycin 1,750 mg in sodium chloride 0.9 % 500 mL Inject 1,750 mg into the vein every 8 (eight) hours. Patient not taking: Reported on 11/23/2017 09/22/17   Duayne Cal, NP    Family History Family History  Problem Relation Age of Onset  . Diabetes Mother   . Diabetes Maternal Aunt   . Diabetes Maternal Grandmother     Social History Social History   Tobacco Use  . Smoking status: Former Smoker    Packs/day: 0.10    Years: 5.00    Pack years: 0.50    Types: Cigarettes    Last attempt to quit: 11/20/2014    Years since quitting: 3.4  . Smokeless tobacco: Never Used  Substance Use Topics  . Alcohol use: No    Alcohol/week: 0.0 oz  . Drug use: No     Allergies   Hydrocodone and Zithromax [azithromycin]   Review of Systems Review of Systems  Musculoskeletal: Positive for back pain.  All other systems reviewed and are negative.    Physical Exam Updated Vital Signs BP 125/71 (BP Location: Right Arm)   Pulse 79   Temp 98 F (36.7 C) (Oral)   Resp 15   SpO2 97%   Physical Exam  Constitutional: She is oriented to person, place, and time. She appears well-developed and well-nourished. No distress.   HENT:  Head: Normocephalic and atraumatic.  Neck: Normal range of motion. Neck supple.  Cardiovascular: Normal rate and regular rhythm. Exam reveals no gallop and no friction rub.  No murmur heard. Pulmonary/Chest: Effort normal and breath sounds normal. No respiratory distress. She has no wheezes.  Abdominal: Soft. Bowel sounds are normal. She exhibits no distension. There is no tenderness.  Musculoskeletal: Normal range of motion.  Neurological: She is alert and oriented to person, place, and time.  Skin: Skin is warm and dry. She is not diaphoretic.  Nursing note and vitals reviewed.    ED Treatments / Results  Labs (all labs ordered are listed, but only abnormal results are displayed) Labs Reviewed  COMPREHENSIVE METABOLIC PANEL - Abnormal; Notable for the following components:      Result Value   Glucose, Bld 107 (*)    All other components within normal limits  CBC - Abnormal; Notable for the following components:   RBC 5.22 (*)    All other components within normal limits  URINALYSIS, ROUTINE W REFLEX MICROSCOPIC - Abnormal; Notable for the following components:   Hgb urine dipstick SMALL (*)    Bacteria, UA RARE (*)    All other components within normal limits  LIPASE, BLOOD  I-STAT BETA HCG BLOOD, ED ([facility], [facility], [facility] ONLY)    EKG None  Radiology Dg Chest 2 View  Result Date: 04/19/2018 CLINICAL DATA:  Acute onset of dry cough and upper chest discomfort. Nausea. Shortness of breath. EXAM: CHEST - 2 VIEW COMPARISON:  Chest radiograph performed 11/23/2017 FINDINGS: The lungs are well-aerated. Chronic bilateral midlung interstitial change or fibrosis is noted. Mild bibasilar opacities are also relatively stable. No superimposed focal airspace consolidation is seen at this time. There is no evidence of pleural effusion or pneumothorax. The heart is normal in size; the mediastinal contour is within normal limits. No acute osseous abnormalities are seen. IMPRESSION: Chronic  bilateral interstitial change or fibrosis noted; as before, interstitial lung disease cannot be excluded. No superimposed focal airspace consolidation seen at this time. Electronically Signed   By: Roanna Raider M.D.   On: 04/19/2018 22:32  Procedures Procedures (including critical care time)  Medications Ordered in ED Medications  traMADol (ULTRAM) tablet 50 mg (has no administration in time range)     Initial Impression / Assessment and Plan / ED Course  I have reviewed the triage vital signs and the nursing notes.  Pertinent labs & imaging results that were available during my care of the patient were reviewed by me and considered in my medical decision making (see chart for details).  Patient with history of lupus presenting with complaints as described in the HPI.  She appears comfortable and well.  I see nothing on her physical examination and that is of concern.  Her laboratory studies are all reassuring and chest x-ray is clear.  Urinalysis shows no evidence for infection.  I see no indication at this time for any further treatment or admission.  I will treat this as a lupus flare.  She will be prescribed tramadol and is to follow up with her doctors as scheduled on June 3.  Final Clinical Impressions(s) / ED Diagnoses   Final diagnoses:  None    ED Discharge Orders    None       Geoffery Lyonselo, Toniette Devera, MD 04/20/18 [phone]

## 2018-04-24 ENCOUNTER — Encounter: Payer: Self-pay | Admitting: Women's Health

## 2018-04-24 ENCOUNTER — Ambulatory Visit (INDEPENDENT_AMBULATORY_CARE_PROVIDER_SITE_OTHER): Payer: Medicare Other | Admitting: Women's Health

## 2018-04-24 VITALS — BP 120/82

## 2018-04-24 DIAGNOSIS — R350 Frequency of micturition: Secondary | ICD-10-CM | POA: Diagnosis not present

## 2018-04-24 DIAGNOSIS — B3731 Acute candidiasis of vulva and vagina: Secondary | ICD-10-CM

## 2018-04-24 DIAGNOSIS — N898 Other specified noninflammatory disorders of vagina: Secondary | ICD-10-CM | POA: Diagnosis not present

## 2018-04-24 DIAGNOSIS — B373 Candidiasis of vulva and vagina: Secondary | ICD-10-CM

## 2018-04-24 LAB — WET PREP FOR TRICH, YEAST, CLUE

## 2018-04-24 IMAGING — DX DG CHEST 1V PORT
1 series · 2 of 2 positions shown · non-contrast
Comparison: CT chest September 18, 2017

CLINICAL DATA: Post intubation.

EXAM:
PORTABLE CHEST 1 VIEW

[Series 1: chest · 0.14mm/px · 2 of 2 slices shown]
[im 1/2]
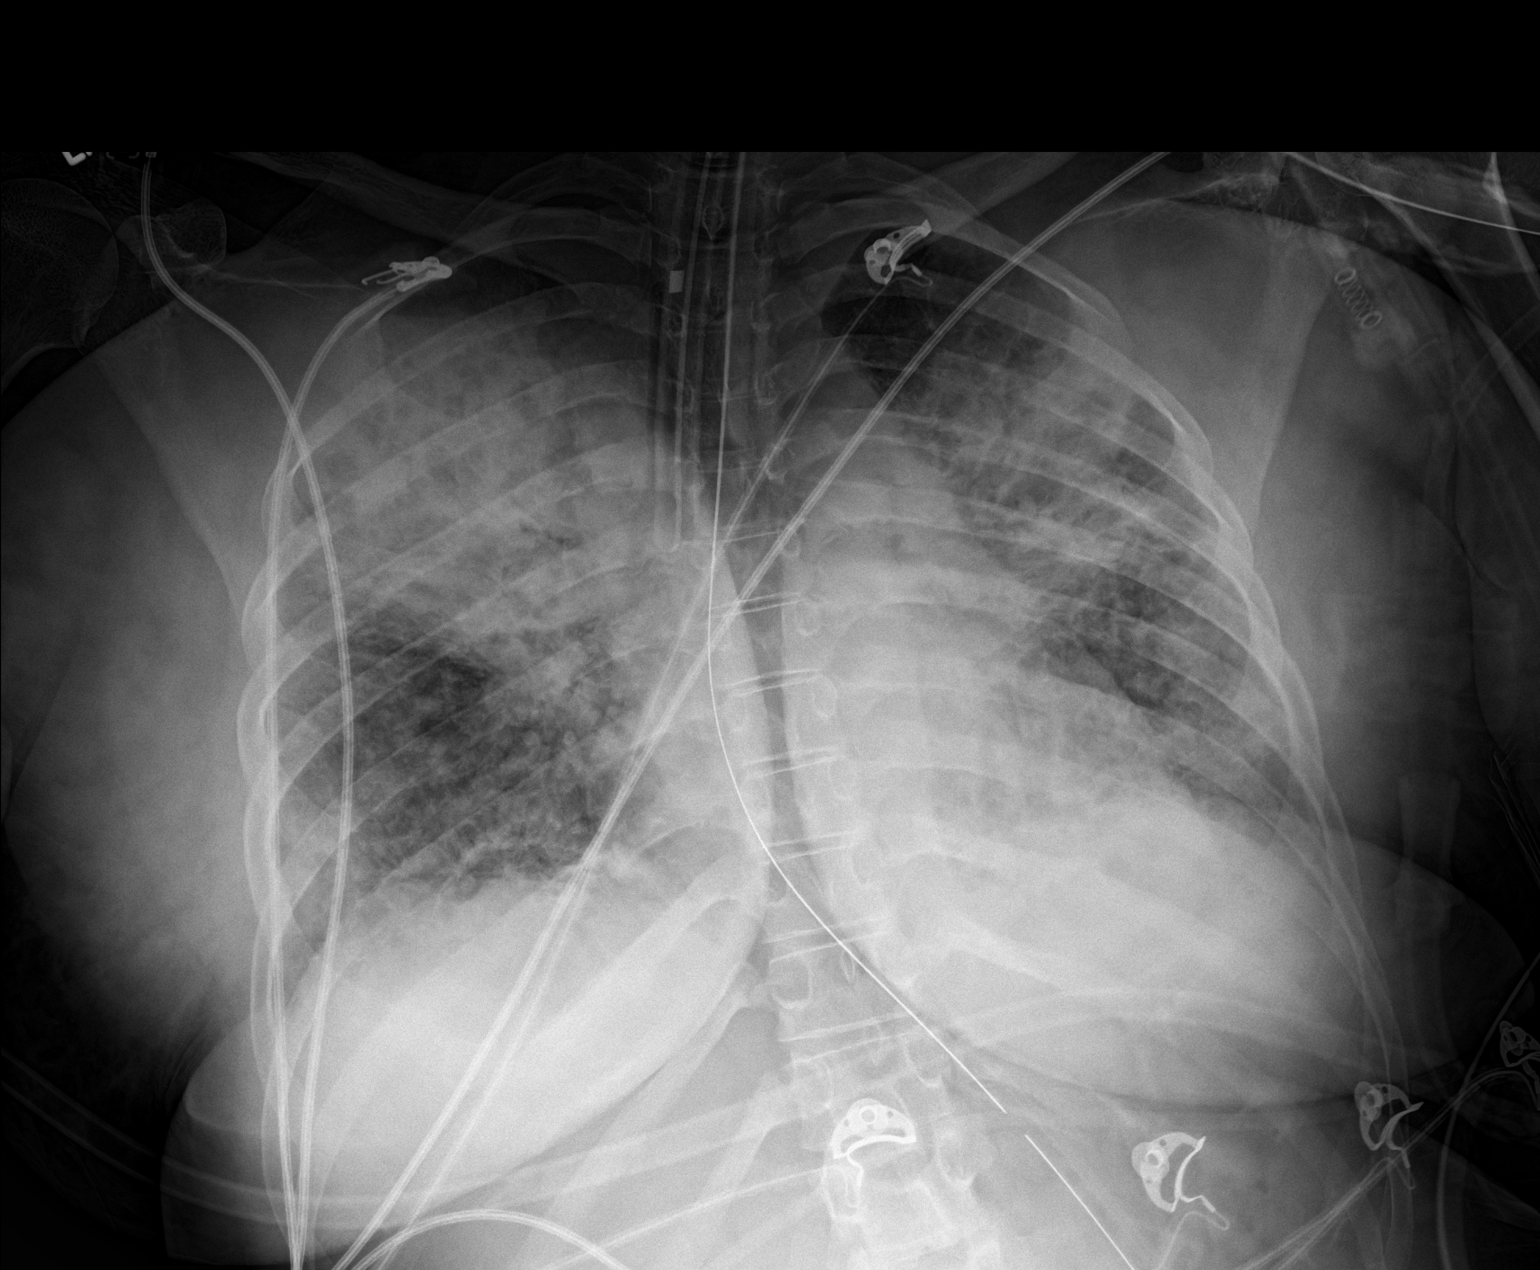
[im 2/2]
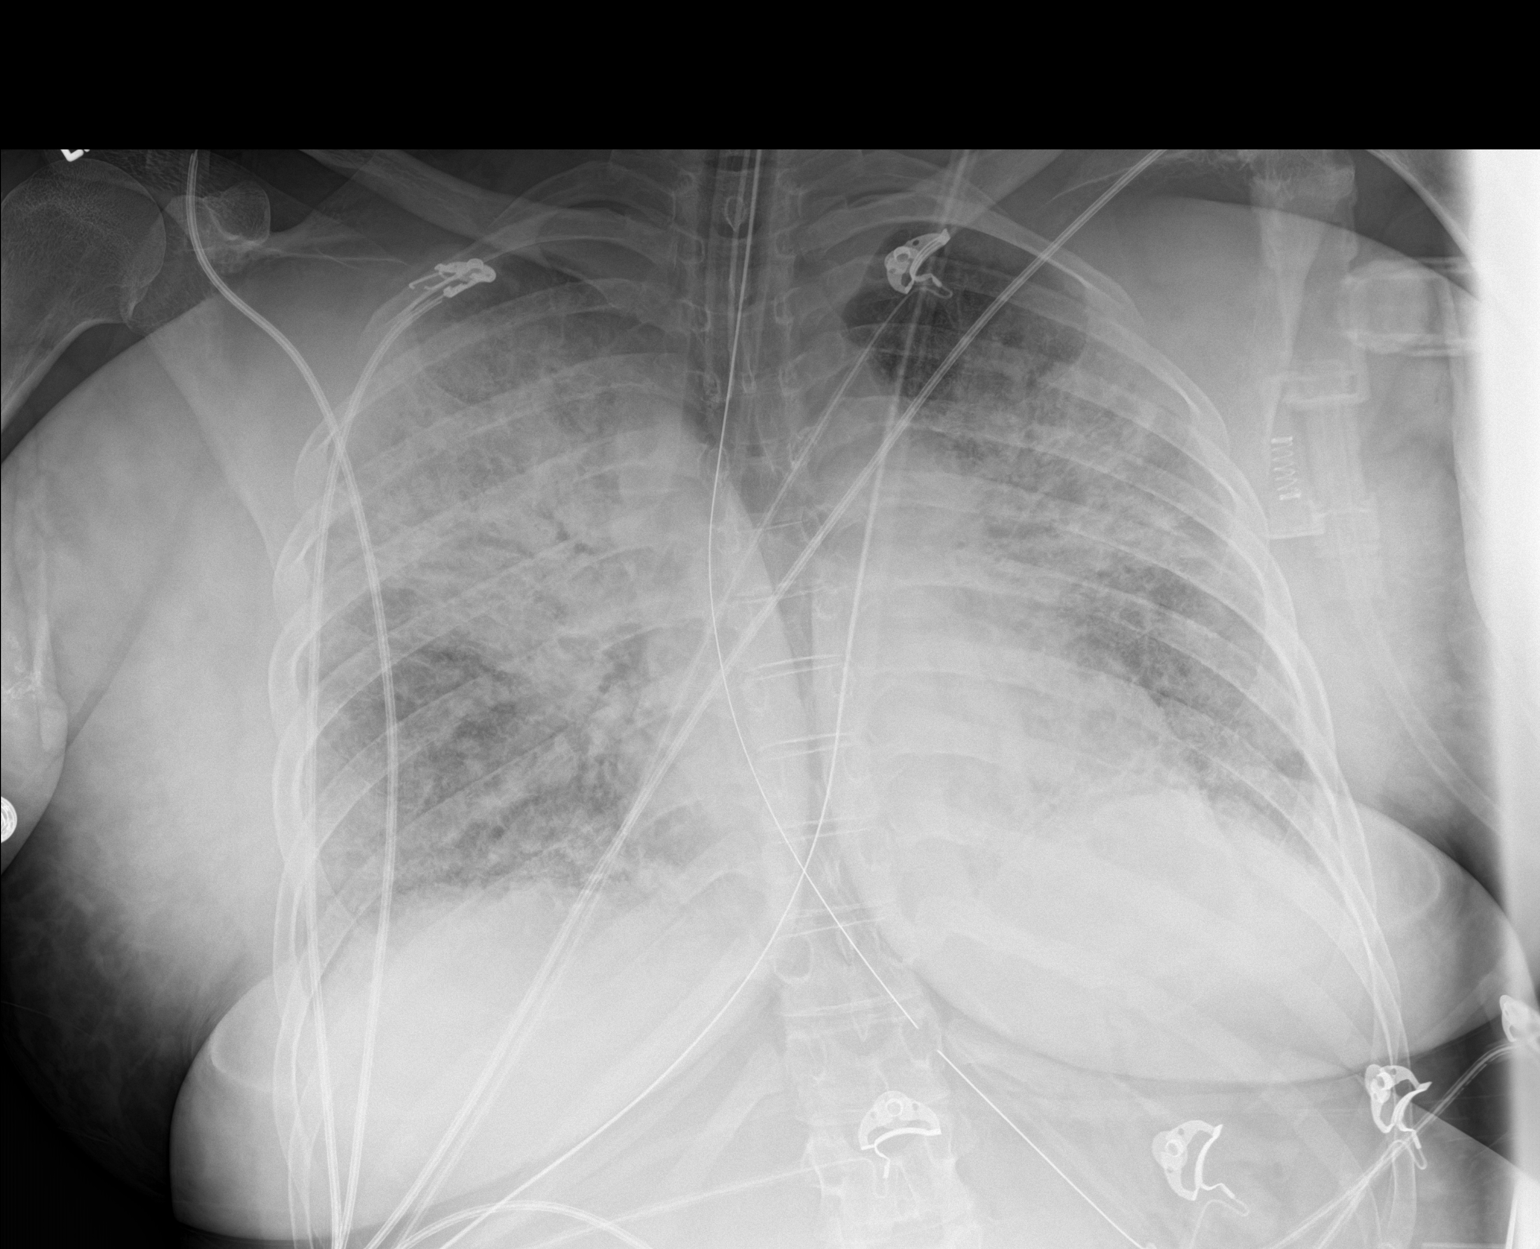

[2 of 2 positions shown; findings below may reference images not displayed]

FINDINGS: RIGHT mainstem bronchus intubation on initial chest radiograph.
Endotracheal tube retraction on follow-up radiograph, distal tip
projects 2.5 cm above the carina. Nasogastric tube past GE junction,
safe side port at GE junction.

Dense interstitial and alveolar consolidation. No pleural effusion.
Cardiomediastinal silhouette is normal. No pneumothorax. Soft tissue
planes and included osseous structures are nonsuspicious.
IMPRESSION: Endotracheal tube tip projects 2.5 cm above the carina. Nasogastric
tube past GE junction, distal tip out of field-of-view.

Severe interstitial and alveolar airspace opacities consistent with
pneumonia superimposed on chronic interstitial lung disease.

## 2018-04-24 MED ORDER — FLUCONAZOLE 150 MG PO TABS: 150.0000 mg | ORAL_TABLET | Freq: Once | ORAL | 1 refills | Status: AC

## 2018-04-24 NOTE — Progress Notes (Signed)
27 year old SBF G3, P1 resents with complaint of vaginal discharge with questionable odor and itching.  Finished antibiotic last week for UTI unsure if UTI resolved.  Significant medical problems include lupus mostly affecting her lungs, hypertension.  Respiratory failure after vaginal delivery October 2018 doing much better.  Positive GC 05/2017 with a negative test to cure and has not been sexually active since.  Denies need for STD screen today.  Daughter [name] 907 months old doing well, sleeping well.  Irregular spotting on Nexplanon placed December 2018  Exam: Appears well.  No CVAT.  Abdomen obese, nontender.  External genitalia within normal limits, speculum exam scant white discharge mild erythema, bimanual no CMT or adnexal tenderness.  Wet Prep negative. UA: +2 blood, +1 protein, negative leukocytes, negative nitrites, 0-5 WBCs, 10-20 RBCs, many bacteria  Probable yeast vaginitis Lupus/lung involvement  Plan: Diflucan 150 p.o. x1 dose prescription, proper use given and reviewed.  Urine culture pending.

## 2018-04-24 NOTE — Patient Instructions (Signed)

## 2018-04-26 ENCOUNTER — Telehealth: Payer: Self-pay | Admitting: Obstetrics and Gynecology

## 2018-04-26 ENCOUNTER — Other Ambulatory Visit: Payer: Self-pay | Admitting: Women's Health

## 2018-04-26 LAB — URINALYSIS, COMPLETE W/RFL CULTURE
Bilirubin Urine: NEGATIVE
GLUCOSE, UA: NEGATIVE
Hyaline Cast: NONE SEEN /LPF
Ketones, ur: NEGATIVE
LEUKOCYTE ESTERASE: NEGATIVE
NITRITES URINE, INITIAL: NEGATIVE
PH: 5.5 (ref 5.0–8.0)
Specific Gravity, Urine: 1.031 (ref 1.001–1.03)

## 2018-04-26 LAB — URINE CULTURE
MICRO NUMBER: 90675730
SPECIMEN QUALITY: ADEQUATE

## 2018-04-26 LAB — CULTURE INDICATED

## 2018-04-26 MED ORDER — NITROFURANTOIN MONOHYD MACRO 100 MG PO CAPS: 100.0000 mg | ORAL_CAPSULE | Freq: Two times a day (BID) | ORAL | 0 refills | Status: DC

## 2018-04-26 NOTE — Telephone Encounter (Signed)
I called the patient after a call from the ans srvc. She had a question about Zofran (which she discussed with Ms. Young today). I recommend that she call the office on 04/27/18 to review the recommendations that were given at today's visit. Dr. Stefano GaulStringer    By Kirkland HunStringer, Zaynah Chawla, MD

## 2018-04-30 ENCOUNTER — Telehealth: Payer: Self-pay | Admitting: *Deleted

## 2018-04-30 MED ORDER — ONDANSETRON 4 MG PO TBDP: 4.0000 mg | ORAL_TABLET | Freq: Three times a day (TID) | ORAL | 0 refills | Status: DC | PRN

## 2018-04-30 NOTE — Telephone Encounter (Signed)
Patient was prescribed Macrobid x 7 days on 04/26/18, called today stating she forgot to ask for Rx for Zofran states medication makes her nauseated. Patient said she does take medication with food. Please advise

## 2018-04-30 NOTE — Telephone Encounter (Signed)
Okay, Zofran 4 mg to take prior to medication, #15 no refills.

## 2018-04-30 NOTE — Telephone Encounter (Signed)
Rx sent, pt aware 

## 2018-05-22 ENCOUNTER — Ambulatory Visit (INDEPENDENT_AMBULATORY_CARE_PROVIDER_SITE_OTHER): Payer: Medicare Other | Admitting: Women's Health

## 2018-05-22 ENCOUNTER — Encounter: Payer: Self-pay | Admitting: Women's Health

## 2018-05-22 VITALS — BP 118/80 | Ht 63.0 in | Wt 187.0 lb

## 2018-05-22 DIAGNOSIS — R350 Frequency of micturition: Secondary | ICD-10-CM

## 2018-05-22 DIAGNOSIS — Z9189 Other specified personal risk factors, not elsewhere classified: Secondary | ICD-10-CM | POA: Diagnosis not present

## 2018-05-22 DIAGNOSIS — Z01419 Encounter for gynecological examination (general) (routine) without abnormal findings: Secondary | ICD-10-CM

## 2018-05-22 DIAGNOSIS — Z113 Encounter for screening for infections with a predominantly sexual mode of transmission: Secondary | ICD-10-CM

## 2018-05-22 NOTE — Progress Notes (Signed)
[id number]  

## 2018-05-22 NOTE — Progress Notes (Signed)
Kayla PonsLashonna [name] 1991/08/26 [id number]    History:    Presents for breast and pelvic exam.  Irregular spotting with Nexplanon placed after delivery October 2018.  Lupus with most problems lung based, had respiratory failure after vaginal delivery October 2018.  History of positive gonorrhea with negative test to cure.  New partner.  Also has psoriatic arthritis, hypertension and asthma.  Past medical history, past surgical history, family history and social history were all reviewed and documented in the EPIC chart.  On disability.  Daughter [name] 8 months thriving.  ROS:  A ROS was performed and pertinent positives and negatives are included.  Exam:  Vitals:   05/22/18 1212  BP: 118/80  Weight: 187 lb (84.8 kg)  Height: 5\' 3"  (1.6 m)   Body mass index is 33.13 kg/m.   General appearance:  Normal Thyroid:  Symmetrical, normal in size, without palpable masses or nodularity. Respiratory  Auscultation:  Clear without wheezing or rhonchi Cardiovascular  Auscultation:  Regular rate, without rubs, murmurs or gallops  Edema/varicosities:  Not grossly evident Abdominal  Soft,nontender, without masses, guarding or rebound.  Liver/spleen:  No organomegaly noted  Hernia:  None appreciated  Skin  Inspection:  Grossly normal   Breasts: Examined lying and sitting.     Right: Without masses, retractions, discharge or axillary adenopathy.     Left: Without masses, retractions, discharge or axillary adenopathy. Gentitourinary   Inguinal/mons:  Normal without inguinal adenopathy  External genitalia:  Normal  BUS/Urethra/Skene's glands:  Normal  Vagina:  Normal  Cervix:  Normal  Uterus:   normal in size, shape and contour.  Midline and mobile  Adnexa/parametria:     Rt: Without masses or tenderness.   Lt: Without masses or tenderness.  Anus and perineum: Normal  Digital rectal exam: Normal sphincter tone without palpated masses or tenderness  Assessment/Plan:  27 y.o. SB FG3P1 for  annual exam with complaint of irregular spotting/bleeding with Nexplanon  Contraception management Lupus/hypertension/psoriatic arthritis/asthma- pulmonologist and primary care STD screen Obesity  Plan: Contraception options reviewed, reviewed importance of no pregnancies due to health problems.  Would like to try Mirena IUD, will check insurance coverage, have Dr. Audie BoxFontaine place , keep  Nexplanon in until IUD placed.  Reviewed importance of no estrogen-based products for contraception.  SBE's, exercise, low calorie/carb diet encouraged.  GC/chlamydia, Pap, hep B, C, RPR.  Harrington Challengerancy J Kasyn St[facility] Brookside CampusWHNP, 1:22 PM 05/22/2018

## 2018-05-22 NOTE — Patient Instructions (Signed)
Health Maintenance, Female Adopting a healthy lifestyle and getting preventive care can go a long way to promote health and wellness. Talk with your health care provider about what schedule of regular examinations is right for you. This is a good chance for you to check in with your provider about disease prevention and staying healthy. In between checkups, there are plenty of things you can do on your own. Experts have done a lot of research about which lifestyle changes and preventive measures are most likely to keep you healthy. Ask your health care provider for more information. Weight and diet Eat a healthy diet  Be sure to include plenty of vegetables, fruits, low-fat dairy products, and lean protein.  Do not eat a lot of foods high in solid fats, added sugars, or salt.  Get regular exercise. This is one of the most important things you can do for your health. ? Most adults should exercise for at least 150 minutes each week. The exercise should increase your heart rate and make you sweat (moderate-intensity exercise). ? Most adults should also do strengthening exercises at least twice a week. This is in addition to the moderate-intensity exercise.  Maintain a healthy weight  Body mass index (BMI) is a measurement that can be used to identify possible weight problems. It estimates body fat based on height and weight. Your health care provider can help determine your BMI and help you achieve or maintain a healthy weight.  For females 20 years of age and older: ? A BMI below 18.5 is considered underweight. ? A BMI of 18.5 to 24.9 is normal. ? A BMI of 25 to 29.9 is considered overweight. ? A BMI of 30 and above is considered obese.  Watch levels of cholesterol and blood lipids  You should start having your blood tested for lipids and cholesterol at 28 years of age, then have this test every 5 years.  You may need to have your cholesterol levels checked more often if: ? Your lipid or  cholesterol levels are high. ? You are older than 28 years of age. ? You are at high risk for heart disease.  Cancer screening Lung Cancer  Lung cancer screening is recommended for adults 55-80 years old who are at high risk for lung cancer because of a history of smoking.  A yearly low-dose CT scan of the lungs is recommended for people who: ? Currently smoke. ? Have quit within the past 15 years. ? Have at least a 30-pack-year history of smoking. A pack year is smoking an average of one pack of cigarettes a day for 1 year.  Yearly screening should continue until it has been 15 years since you quit.  Yearly screening should stop if you develop a health problem that would prevent you from having lung cancer treatment.  Breast Cancer  Practice breast self-awareness. This means understanding how your breasts normally appear and feel.  It also means doing regular breast self-exams. Let your health care provider know about any changes, no matter how small.  If you are in your 20s or 30s, you should have a clinical breast exam (CBE) by a health care provider every 1-3 years as part of a regular health exam.  If you are 40 or older, have a CBE every year. Also consider having a breast X-ray (mammogram) every year.  If you have a family history of breast cancer, talk to your health care provider about genetic screening.  If you are at high risk   for breast cancer, talk to your health care provider about having an MRI and a mammogram every year.  Breast cancer gene (BRCA) assessment is recommended for women who have family members with BRCA-related cancers. BRCA-related cancers include: ? Breast. ? Ovarian. ? Tubal. ? Peritoneal cancers.  Results of the assessment will determine the need for genetic counseling and BRCA1 and BRCA2 testing.  Cervical Cancer Your health care provider may recommend that you be screened regularly for cancer of the pelvic organs (ovaries, uterus, and  vagina). This screening involves a pelvic examination, including checking for microscopic changes to the surface of your cervix (Pap test). You may be encouraged to have this screening done every 3 years, beginning at age 22.  For women ages 56-65, health care providers may recommend pelvic exams and Pap testing every 3 years, or they may recommend the Pap and pelvic exam, combined with testing for human papilloma virus (HPV), every 5 years. Some types of HPV increase your risk of cervical cancer. Testing for HPV may also be done on women of any age with unclear Pap test results.  Other health care providers may not recommend any screening for nonpregnant women who are considered low risk for pelvic cancer and who do not have symptoms. Ask your health care provider if a screening pelvic exam is right for you.  If you have had past treatment for cervical cancer or a condition that could lead to cancer, you need Pap tests and screening for cancer for at least 20 years after your treatment. If Pap tests have been discontinued, your risk factors (such as having a new sexual partner) need to be reassessed to determine if screening should resume. Some women have medical problems that increase the chance of getting cervical cancer. In these cases, your health care provider may recommend more frequent screening and Pap tests.  Colorectal Cancer  This type of cancer can be detected and often prevented.  Routine colorectal cancer screening usually begins at 28 years of age and continues through 28 years of age.  Your health care provider may recommend screening at an earlier age if you have risk factors for colon cancer.  Your health care provider may also recommend using home test kits to check for hidden blood in the stool.  A small camera at the end of a tube can be used to examine your colon directly (sigmoidoscopy or colonoscopy). This is done to check for the earliest forms of colorectal  cancer.  Routine screening usually begins at age 33.  Direct examination of the colon should be repeated every 5-10 years through 28 years of age. However, you may need to be screened more often if early forms of precancerous polyps or small growths are found.  Skin Cancer  Check your skin from head to toe regularly.  Tell your health care provider about any new moles or changes in moles, especially if there is a change in a mole's shape or color.  Also tell your health care provider if you have a mole that is larger than the size of a pencil eraser.  Always use sunscreen. Apply sunscreen liberally and repeatedly throughout the day.  Protect yourself by wearing long sleeves, pants, a wide-brimmed hat, and sunglasses whenever you are outside.  Heart disease, diabetes, and high blood pressure  High blood pressure causes heart disease and increases the risk of stroke. High blood pressure is more likely to develop in: ? People who have blood pressure in the high end of  the normal range (130-139/85-89 mm Hg). ? People who are overweight or obese. ? People who are African American.  If you are 21-29 years of age, have your blood pressure checked every 3-5 years. If you are 3 years of age or older, have your blood pressure checked every year. You should have your blood pressure measured twice-once when you are at a hospital or clinic, and once when you are not at a hospital or clinic. Record the average of the two measurements. To check your blood pressure when you are not at a hospital or clinic, you can use: ? An automated blood pressure machine at a pharmacy. ? A home blood pressure monitor.  If you are between 17 years and 37 years old, ask your health care provider if you should take aspirin to prevent strokes.  Have regular diabetes screenings. This involves taking a blood sample to check your fasting blood sugar level. ? If you are at a normal weight and have a low risk for diabetes,  have this test once every three years after 28 years of age. ? If you are overweight and have a high risk for diabetes, consider being tested at a younger age or more often. Preventing infection Hepatitis B  If you have a higher risk for hepatitis B, you should be screened for this virus. You are considered at high risk for hepatitis B if: ? You were born in a country where hepatitis B is common. Ask your health care provider which countries are considered high risk. ? Your parents were born in a high-risk country, and you have not been immunized against hepatitis B (hepatitis B vaccine). ? You have HIV or AIDS. ? You use needles to inject street drugs. ? You live with someone who has hepatitis B. ? You have had sex with someone who has hepatitis B. ? You get hemodialysis treatment. ? You take certain medicines for conditions, including cancer, organ transplantation, and autoimmune conditions.  Hepatitis C  Blood testing is recommended for: ? Everyone born from [birth year] through 1966. ? Anyone with known risk factors for hepatitis C.  Sexually transmitted infections (STIs)  You should be screened for sexually transmitted infections (STIs) including gonorrhea and chlamydia if: ? You are sexually active and are younger than 28 years of age. ? You are older than 28 years of age and your health care provider tells you that you are at risk for this type of infection. ? Your sexual activity has changed since you were last screened and you are at an increased risk for chlamydia or gonorrhea. Ask your health care provider if you are at risk.  If you do not have HIV, but are at risk, it may be recommended that you take a prescription medicine daily to prevent HIV infection. This is called pre-exposure prophylaxis (PrEP). You are considered at risk if: ? You are sexually active and do not regularly use condoms or know the HIV status of your partner(s). ? You take drugs by injection. ? You are  sexually active with a partner who has HIV.  Talk with your health care provider about whether you are at high risk of being infected with HIV. If you choose to begin PrEP, you should first be tested for HIV. You should then be tested every 3 months for as long as you are taking PrEP. Pregnancy  If you are premenopausal and you may become pregnant, ask your health care provider about preconception counseling.  If you may become  pregnant, take 400 to 800 micrograms (mcg) of folic acid every day.  If you want to prevent pregnancy, talk to your health care provider about birth control (contraception). Osteoporosis and menopause  Osteoporosis is a disease in which the bones lose minerals and strength with aging. This can result in serious bone fractures. Your risk for osteoporosis can be identified using a bone density scan.  If you are 65 years of age or older, or if you are at risk for osteoporosis and fractures, ask your health care provider if you should be screened.  Ask your health care provider whether you should take a calcium or vitamin D supplement to lower your risk for osteoporosis.  Menopause may have certain physical symptoms and risks.  Hormone replacement therapy may reduce some of these symptoms and risks. Talk to your health care provider about whether hormone replacement therapy is right for you. Follow these instructions at home:  Schedule regular health, dental, and eye exams.  Stay current with your immunizations.  Do not use any tobacco products including cigarettes, chewing tobacco, or electronic cigarettes.  If you are pregnant, do not drink alcohol.  If you are breastfeeding, limit how much and how often you drink alcohol.  Limit alcohol intake to no more than 1 drink per day for nonpregnant women. One drink equals 12 ounces of beer, 5 ounces of wine, or 1 ounces of hard liquor.  Do not use street drugs.  Do not share needles.  Ask your health care  provider for help if you need support or information about quitting drugs.  Tell your health care provider if you often feel depressed.  Tell your health care provider if you have ever been abused or do not feel safe at home. This information is not intended to replace advice given to you by your health care provider. Make sure you discuss any questions you have with your health care provider. Document Released: 05/23/2011 Document Revised: 04/14/2016 Document Reviewed: 08/11/2015 Elsevier Interactive Patient Education  2018 [employer] Levonorgestrel intrauterine device (IUD) What is this medicine? LEVONORGESTREL IUD (LEE voe nor jes trel) is a contraceptive (birth control) device. The device is placed inside the uterus by a healthcare professional. It is used to prevent pregnancy. This device can also be used to treat heavy bleeding that occurs during your period. This medicine may be used for other purposes; ask your health care provider or pharmacist if you have questions. COMMON BRAND NAME(S): Kyleena, LILETTA, Mirena, Skyla What should I tell my health care provider before I take this medicine? They need to know if you have any of these conditions: -abnormal Pap smear -cancer of the breast, uterus, or cervix -diabetes -endometritis -genital or pelvic infection now or in the past -have more than one sexual partner or your partner has more than one partner -heart disease -history of an ectopic or tubal pregnancy -immune system problems -IUD in place -liver disease or tumor -problems with blood clots or take blood-thinners -seizures -use intravenous drugs -uterus of unusual shape -vaginal bleeding that has not been explained -an unusual or allergic reaction to levonorgestrel, other hormones, silicone, or polyethylene, medicines, foods, dyes, or preservatives -pregnant or trying to get pregnant -breast-feeding How should I use this medicine? This device is placed inside the  uterus by a health care professional. Talk to your pediatrician regarding the use of this medicine in children. Special care may be needed. Overdosage: If you think you have taken too much of this medicine contact a   poison control center or emergency room at once. NOTE: This medicine is only for you. Do not share this medicine with others. What if I miss a dose? This does not apply. Depending on the brand of device you have inserted, the device will need to be replaced every 3 to 5 years if you wish to continue using this type of birth control. What may interact with this medicine? Do not take this medicine with any of the following medications: -amprenavir -bosentan -fosamprenavir This medicine may also interact with the following medications: -aprepitant -armodafinil -barbiturate medicines for inducing sleep or treating seizures -bexarotene -boceprevir -griseofulvin -medicines to treat seizures like carbamazepine, ethotoin, felbamate, oxcarbazepine, phenytoin, topiramate -modafinil -pioglitazone -rifabutin -rifampin -rifapentine -some medicines to treat HIV infection like atazanavir, efavirenz, indinavir, lopinavir, nelfinavir, tipranavir, ritonavir -St. John's wort -warfarin This list may not describe all possible interactions. Give your health care provider a list of all the medicines, herbs, non-prescription drugs, or dietary supplements you use. Also tell them if you smoke, drink alcohol, or use illegal drugs. Some items may interact with your medicine. What should I watch for while using this medicine? Visit your doctor or health care professional for regular check ups. See your doctor if you or your partner has sexual contact with others, becomes HIV positive, or gets a sexual transmitted disease. This product does not protect you against HIV infection (AIDS) or other sexually transmitted diseases. You can check the placement of the IUD yourself by reaching up to the top of  your vagina with clean fingers to feel the threads. Do not pull on the threads. It is a good habit to check placement after each menstrual period. Call your doctor right away if you feel more of the IUD than just the threads or if you cannot feel the threads at all. The IUD may come out by itself. You may become pregnant if the device comes out. If you notice that the IUD has come out use a backup birth control method like condoms and call your health care provider. Using tampons will not change the position of the IUD and are okay to use during your period. This IUD can be safely scanned with magnetic resonance imaging (MRI) only under specific conditions. Before you have an MRI, tell your healthcare provider that you have an IUD in place, and which type of IUD you have in place. What side effects may I notice from receiving this medicine? Side effects that you should report to your doctor or health care professional as soon as possible: -allergic reactions like skin rash, itching or hives, swelling of the face, lips, or tongue -fever, flu-like symptoms -genital sores -high blood pressure -no menstrual period for 6 weeks during use -pain, swelling, warmth in the leg -pelvic pain or tenderness -severe or sudden headache -signs of pregnancy -stomach cramping -sudden shortness of breath -trouble with balance, talking, or walking -unusual vaginal bleeding, discharge -yellowing of the eyes or skin Side effects that usually do not require medical attention (report to your doctor or health care professional if they continue or are bothersome): -acne -breast pain -change in sex drive or performance -changes in weight -cramping, dizziness, or faintness while the device is being inserted -headache -irregular menstrual bleeding within first 3 to 6 months of use -nausea This list may not describe all possible side effects. Call your doctor for medical advice about side effects. You may report side  effects to FDA at 1-800-FDA-1088. Where should I keep my medicine? This does   not apply. NOTE: This sheet is a summary. It may not cover all possible information. If you have questions about this medicine, talk to your doctor, pharmacist, or health care provider.  2018 Elsevier/Gold Standard (2016-08-19 14:14:56)

## 2018-05-23 LAB — C. TRACHOMATIS/N. GONORRHOEAE RNA
C. trachomatis RNA, TMA: DETECTED — AB
N. GONORRHOEAE RNA, TMA: NOT DETECTED

## 2018-05-23 LAB — PAP IG W/ RFLX HPV ASCU

## 2018-05-23 LAB — RPR: RPR: NONREACTIVE

## 2018-05-23 LAB — HEPATITIS C ANTIBODY
Hepatitis C Ab: NONREACTIVE
SIGNAL TO CUT-OFF: 0.01 (ref <1.00)

## 2018-05-23 LAB — HEPATITIS B SURFACE ANTIGEN: Hepatitis B Surface Ag: NONREACTIVE

## 2018-05-25 ENCOUNTER — Telehealth: Payer: Self-pay

## 2018-05-25 ENCOUNTER — Other Ambulatory Visit: Payer: Self-pay | Admitting: Women's Health

## 2018-05-25 LAB — URINALYSIS, COMPLETE W/RFL CULTURE
BILIRUBIN URINE: NEGATIVE
GLUCOSE, UA: NEGATIVE
Hyaline Cast: NONE SEEN /LPF
Ketones, ur: NEGATIVE
LEUKOCYTE ESTERASE: NEGATIVE
Nitrites, Initial: NEGATIVE
Specific Gravity, Urine: 1.026 (ref 1.001–1.03)
pH: 5 (ref 5.0–8.0)

## 2018-05-25 LAB — URINE CULTURE
MICRO NUMBER:: 90792721
SPECIMEN QUALITY:: ADEQUATE

## 2018-05-25 LAB — CULTURE INDICATED

## 2018-05-25 MED ORDER — ONDANSETRON HCL 4 MG PO TABS: 4.0000 mg | ORAL_TABLET | Freq: Three times a day (TID) | ORAL | 0 refills | Status: DC | PRN

## 2018-05-25 MED ORDER — NITROFURANTOIN MONOHYD MACRO 100 MG PO CAPS: 100.0000 mg | ORAL_CAPSULE | Freq: Two times a day (BID) | ORAL | 0 refills | Status: DC

## 2018-05-25 MED ORDER — DOXYCYCLINE HYCLATE 100 MG PO TABS: 100.0000 mg | ORAL_TABLET | Freq: Two times a day (BID) | ORAL | 0 refills | Status: DC

## 2018-05-25 NOTE — Telephone Encounter (Signed)
Rx sent 

## 2018-05-25 NOTE — Telephone Encounter (Signed)
Ok for zofran 4 mg q 4 hr prn #30, 0 refills

## 2018-05-25 NOTE — Telephone Encounter (Signed)
Patient asked if you would prescribe Zofran for her. She said antibiotics usually make her sick.

## 2018-05-25 NOTE — Telephone Encounter (Signed)
-----   Message from Harrington ChallengerNancy J Young, NP sent at 05/25/2018  8:45 AM EDT ----- Please call pos chlamydia, disregard note for zithromax sent earlier, allergy to zithromax so doxycycline 100mg  bid for 7 days and macrobid bid for 7 days for UTI.  Same instructions -  abstain, [facility], inform partner for treatment, notify HD

## 2018-06-19 ENCOUNTER — Encounter: Payer: Self-pay | Admitting: Women's Health

## 2018-06-19 ENCOUNTER — Ambulatory Visit (INDEPENDENT_AMBULATORY_CARE_PROVIDER_SITE_OTHER): Payer: Medicare Other | Admitting: Women's Health

## 2018-06-19 VITALS — BP 110/68 | Wt 188.0 lb

## 2018-06-19 DIAGNOSIS — Z113 Encounter for screening for infections with a predominantly sexual mode of transmission: Secondary | ICD-10-CM | POA: Diagnosis not present

## 2018-06-19 MED ORDER — FLUCONAZOLE 150 MG PO TABS: 150.0000 mg | ORAL_TABLET | Freq: Once | ORAL | 1 refills | Status: AC

## 2018-06-19 NOTE — Progress Notes (Signed)
27 year old SBF G3, P1 presents for test of cure chlamydia.  Negative HIV, hepatitis RPR.  Partner treated and has abstained.  Denies vaginal discharge,  urinary symptoms or fever.  States  has mid lower intermittent abdominal cramping.  Questions if she has an ovarian  cyst, states discomfort is similar to when she had a cyst in the past that resolved on its own.  08/2017 Nexplanon amenorrheic.  [facility].  1937-month-old daughter [name] thriving.  Exam: Appears well.  External genitalia within normal limits, speculum exam scant discharge without odor or erythema, GC/chlamydia culture taken.  Bimanual no CMT, slight tenderness in lower abdomen.  Test to cure chlamydia  Plan: Reviewed if pain persists we will follow-up with ultrasound.  Encouraged over-the-counter Tylenol.  Reviewed cyst can be cyclic and resolve on own.  Condoms encouraged until permanent partner.

## 2018-06-20 LAB — C. TRACHOMATIS/N. GONORRHOEAE RNA
C. trachomatis RNA, TMA: NOT DETECTED
N. gonorrhoeae RNA, TMA: NOT DETECTED

## 2018-06-26 ENCOUNTER — Encounter (INDEPENDENT_AMBULATORY_CARE_PROVIDER_SITE_OTHER): Payer: Self-pay

## 2018-06-30 ENCOUNTER — Encounter (HOSPITAL_COMMUNITY): Payer: Self-pay | Admitting: Emergency Medicine

## 2018-06-30 ENCOUNTER — Emergency Department (HOSPITAL_COMMUNITY): Payer: Medicare Other

## 2018-06-30 ENCOUNTER — Emergency Department (HOSPITAL_COMMUNITY)
Admission: EM | Admit: 2018-06-30 | Discharge: 2018-06-30 | Disposition: A | Discharge status: Home / Self Care | Payer: Medicare Other | Attending: Emergency Medicine | Admitting: Emergency Medicine

## 2018-06-30 ENCOUNTER — Other Ambulatory Visit: Payer: Self-pay

## 2018-06-30 DIAGNOSIS — Z79899 Other long term (current) drug therapy: Secondary | ICD-10-CM | POA: Diagnosis not present

## 2018-06-30 DIAGNOSIS — Z87891 Personal history of nicotine dependence: Secondary | ICD-10-CM | POA: Insufficient documentation

## 2018-06-30 DIAGNOSIS — R1011 Right upper quadrant pain: Secondary | ICD-10-CM | POA: Diagnosis not present

## 2018-06-30 DIAGNOSIS — M3500 Sicca syndrome, unspecified: Secondary | ICD-10-CM | POA: Insufficient documentation

## 2018-06-30 DIAGNOSIS — R0789 Other chest pain: Secondary | ICD-10-CM | POA: Diagnosis not present

## 2018-06-30 DIAGNOSIS — I10 Essential (primary) hypertension: Secondary | ICD-10-CM | POA: Diagnosis not present

## 2018-06-30 DIAGNOSIS — Z7982 Long term (current) use of aspirin: Secondary | ICD-10-CM | POA: Diagnosis not present

## 2018-06-30 LAB — BASIC METABOLIC PANEL
ANION GAP: 10 (ref 5–15)
BUN: 15 mg/dL (ref 6–20)
CO2: 23 mmol/L (ref 22–32)
Calcium: 8.8 mg/dL — ABNORMAL LOW (ref 8.9–10.3)
Chloride: 105 mmol/L (ref 98–111)
Creatinine, Ser: 0.94 mg/dL (ref 0.44–1.00)
GFR calc Af Amer: >60 mL/min (ref >60)
GFR calc non Af Amer: >60 mL/min (ref >60)
Glucose, Bld: 103 mg/dL — ABNORMAL HIGH (ref 70–99)
POTASSIUM: 3.2 mmol/L — AB (ref 3.5–5.1)
SODIUM: 138 mmol/L (ref 135–145)

## 2018-06-30 LAB — I-STAT TROPONIN, ED
TROPONIN I, POC: 0 ng/mL (ref 0.00–0.08)
Troponin i, poc: 0 ng/mL (ref 0.00–0.08)

## 2018-06-30 LAB — URINALYSIS, ROUTINE W REFLEX MICROSCOPIC
BILIRUBIN URINE: NEGATIVE
Glucose, UA: NEGATIVE mg/dL
Hgb urine dipstick: NEGATIVE
KETONES UR: NEGATIVE mg/dL
LEUKOCYTES UA: NEGATIVE
NITRITE: NEGATIVE
Protein, ur: NEGATIVE mg/dL
Specific Gravity, Urine: 1.028 (ref 1.005–1.030)
pH: 6 (ref 5.0–8.0)

## 2018-06-30 LAB — CBC
HCT: 42.5 % (ref 36.0–46.0)
HEMOGLOBIN: 13.4 g/dL (ref 12.0–15.0)
MCH: 26.5 pg (ref 26.0–34.0)
MCHC: 31.5 g/dL (ref 30.0–36.0)
MCV: 84.2 fL (ref 78.0–100.0)
Platelets: 264 10*3/uL (ref 150–400)
RBC: 5.05 MIL/uL (ref 3.87–5.11)
RDW: 12.7 % (ref 11.5–15.5)
WBC: 9.1 10*3/uL (ref 4.0–10.5)

## 2018-06-30 LAB — I-STAT BETA HCG BLOOD, ED (MC, WL, AP ONLY)

## 2018-06-30 LAB — D-DIMER, QUANTITATIVE (NOT AT ARMC): D DIMER QUANT: 0.38 ug{FEU}/mL (ref 0.00–0.50)

## 2018-06-30 LAB — LIPASE, BLOOD: Lipase: 35 U/L (ref 11–51)

## 2018-06-30 MED ORDER — OXYCODONE HCL 5 MG PO TABS
10.0000 mg | ORAL_TABLET | Freq: Once | ORAL | Status: AC
  Administered 2018-06-30: 10 mg via ORAL
  Filled 2018-06-30 (×2): qty 2

## 2018-06-30 MED ORDER — ONDANSETRON 4 MG PO TBDP
4.0000 mg | ORAL_TABLET | Freq: Once | ORAL | Status: AC
  Administered 2018-06-30: 4 mg via ORAL
  Filled 2018-06-30: qty 1

## 2018-06-30 MED ORDER — POTASSIUM CHLORIDE CRYS ER 20 MEQ PO TBCR
40.0000 meq | EXTENDED_RELEASE_TABLET | Freq: Once | ORAL | Status: AC
  Administered 2018-06-30: 40 meq via ORAL
  Filled 2018-06-30: qty 2

## 2018-06-30 MED ORDER — ONDANSETRON 4 MG PO TBDP: 4.0000 mg | ORAL_TABLET | Freq: Three times a day (TID) | ORAL | 0 refills | Status: DC | PRN

## 2018-06-30 MED ORDER — OXYCODONE HCL 5 MG PO TABS: 5.0000 mg | ORAL_TABLET | Freq: Four times a day (QID) | ORAL | 0 refills | Status: DC | PRN

## 2018-06-30 NOTE — ED Notes (Signed)
Hepatic panel rec sent to main lab for add on

## 2018-06-30 NOTE — ED Notes (Signed)
Pt keeps removing monitoring equipment trying to walk around and tend to [id number] month old daughter she brought with her in the room.

## 2018-06-30 NOTE — ED Triage Notes (Signed)
Pt presents with CP across chest since last night, denies sob, n/v/d, lightheadedness or dizziness; also having RUQ abd pain last night also with some bloating; hx lupus

## 2018-06-30 NOTE — Discharge Instructions (Signed)
Continue home medications as previously prescribed.  Return to the emergency department if symptoms worsen.  Take oxycodone only as prescribed.  Do not drive or operate heavy machinery while taking this medication.

## 2018-06-30 NOTE — ED Provider Notes (Signed)
[facility] Provider Note   CSN: [id number] Arrival date & time: 06/30/18  1614     History   Chief Complaint Chief Complaint  Patient presents with  . Chest Pain  . Abdominal Pain    HPI Lelan PonsLashonna Cayson is a 27 y.o. female.  HPI  27 year old female with history of lupus, hypertension, pulmonary hypertension, and Sjogren's syndrome presents to the emergency department today for evaluation of chest pains as well as right upper quadrant abdominal pain.  States that symptoms began last night.  She describes sharp pains in her right chest.  Worse when she takes a deep breath.  Also worse when she lies flat.  Better with sitting up and rest.  Has also had some cough that exacerbates her pain.  No shortness of breath.  No nausea or vomiting or diarrhea.  No lightheadedness or dizziness.  Is also been having bloating with right upper quadrant pain.  Describes as aching.  No history of abdominal surgeries in the past.  No dysuria or hematuria.  No vaginal bleeding or discharge.  Does not think that she is pregnant at this time.  States that her symptoms all feel like prior lupus exacerbations.   Past Medical History:  Diagnosis Date  . Arthritis    "hands and legs" (01/08/2015)  . CAP (community acquired pneumonia) 01/07/2015  . Daily headache    "sometimes" (01/08/2015)  . GERD (gastroesophageal reflux disease)   . Hypertension   . Lung disease   . Lupus (HCC)   . Pulmonary hypertension (HCC)   . Sjogren's syndrome [facility](HCC)     Patient Active Problem List   Diagnosis Date Noted  . Acute respiratory failure with hypoxemia (HCC) 09/19/2017  . Acute respiratory failure with hypoxia (HCC)   . Multifocal pneumonia   . ILD (interstitial lung disease) (HCC)   . History of IUFD 02/14/2017  . 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 Chronic Respiratory failure with oxygen requirement affecting pregnancy, antepartum  05/16/2016  . Chronic hypertension 03/14/2016  . 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 Lupus (systemic lupus erythematosus) (HCC) 08/21/2015  . Loud P2 (pulmonary S2, second heart sound) 08/21/2015  . Insomnia 08/21/2015  . Systemic lupus erythematosus (SLE) affecting pregnancy, antepartum (HCC) 07/06/2015  . Sjogren's syndrome (HCC) 04/28/2015  . Interstitial lung disease (HCC) 02/06/2015    Past Surgical History:  Procedure Laterality Date  . CARDIAC CATHETERIZATION N/A 09/09/2015   Procedure: Right Heart Cath;  Surgeon: Laurey Moralealton S McLean, MD;  [facility];  Service: Cardiovascular;  Laterality: N/A;  . DILATION AND EVACUATION N/A 07/13/2015   Procedure: DILATATION AND EVACUATION;  Surgeon: Levie HeritageJacob J Stinson, DO;  Location: [facility] ORS;  Service: Gynecology;  Laterality: N/A;  . FINGER SURGERY Right 03/2014   "laceration, nerve/artery injury" 2nd digit  . VIDEO BRONCHOSCOPY Bilateral 01/12/2015   Procedure: VIDEO BRONCHOSCOPY WITH FLUORO;  Surgeon: Leslye Peerobert S Byrum, MD;  [facility];  Service: Cardiopulmonary;  Laterality: Bilateral;     OB History    Gravida  3   Para  2   Term  1   Preterm  1   AB  1   Living  1     SAB  1   TAB  0   Ectopic  0   Multiple  0   Live Births  1            Home Medications  Prior to Admission medications   Medication Sig Start Date End Date Taking? Authorizing Provider  albuterol (PROVENTIL HFA;VENTOLIN HFA) 108 (90 Base) MCG/ACT inhaler Inhale 2 puffs into the lungs every 4 (four) hours as needed for wheezing or shortness of breath. 06/09/17  Yes Adam Phenix, MD  amLODipine (NORVASC) 5 MG tablet Take 1 tablet (5 mg total) by mouth daily. 09/22/17  Yes Duayne Cal, NP  hydroxychloroquine (PLAQUENIL) 200 MG tablet Take 400 mg by mouth daily.   Yes [provider]  mycophenolate (CELLCEPT) 500 MG tablet Take 1,500 mg by mouth 2 (two) times daily. 06/15/18  Yes [provider]  predniSONE (DELTASONE) 5 MG tablet Take 10 mg by mouth daily with breakfast.    Yes [provider]  sertraline (ZOLOFT) 25 MG tablet Take 25 mg by mouth daily. 09/27/17 09/27/18 Yes [provider]  aspirin EC 81 MG tablet Take 1 tablet (81 mg total) by mouth daily. Patient not taking: Reported on 06/30/2018 03/13/17   Lesly Dukes, MD  azaTHIOprine (IMURAN) 50 MG tablet Take 3 tablets (150 mg total) by mouth daily. Patient not taking: Reported on 06/30/2018 09/15/17   Minott, Lynnae Prude, MD  butalbital-acetaminophen-caffeine (FIORICET, ESGIC) [phone] MG tablet Take 1 tablet by mouth every 6 (six) hours as needed for headache. Patient not taking: Reported on 06/19/2018 02/19/18 02/19/19  Allie Bossier, MD  doxycycline (VIBRA-TABS) 100 MG tablet Take 1 tablet (100 mg total) by mouth 2 (two) times daily. Patient not taking: Reported on 06/19/2018 05/25/18   Harrington Challenger, NP  fluticasone [facility]) 50 MCG/ACT nasal spray Place 2 sprays into both nostrils daily. Patient not taking: Reported on 06/19/2018 04/12/17   Rasch, Victorino Dike I, NP  ibuprofen (ADVIL,MOTRIN) 600 MG tablet Take 1 tablet (600 mg total) by mouth every 6 (six) hours. Patient not taking: Reported on 06/30/2018 09/15/17   Minott, Lynnae Prude, MD  nitrofurantoin, macrocrystal-monohydrate, (MACROBID) 100 MG capsule Take 1 capsule (100 mg total) by mouth 2 (two) times daily. Patient not taking: Reported on 06/19/2018 05/25/18   Harrington Challenger, NP  ondansetron (ZOFRAN ODT) 4 MG disintegrating tablet Take 1 tablet (4 mg total) by mouth every 8 (eight) hours as needed for nausea or vomiting. 06/30/18   Rigoberto Noel, MD  ondansetron (ZOFRAN) 4 MG tablet Take 1 tablet (4 mg total) by mouth every 8 (eight) hours as needed for nausea or vomiting. Patient not taking: Reported on 06/30/2018 05/25/18   Harrington Challenger, NP    Family History Family History  Problem Relation Age of Onset  . Diabetes Mother   . Diabetes Maternal  Aunt   . Diabetes Maternal Grandmother     Social History Social History   Tobacco Use  . Smoking status: Former Smoker    Packs/day: 0.10    Years: 5.00    Pack years: 0.50    Types: Cigarettes    Last attempt to quit: 11/20/2014    Years since quitting: 3.6  . Smokeless tobacco: Never Used  Substance Use Topics  . Alcohol use: Yes    Alcohol/week: 0.0 standard drinks  . Drug use: No     Allergies   Hydrocodone and Zithromax [azithromycin]   Review of Systems Review of Systems  Constitutional: Negative for chills and fever.  HENT: Negative for congestion and sore throat.   Eyes: Negative for visual disturbance.  Respiratory: Positive for cough. Negative for shortness of breath.   Cardiovascular: Positive for chest pain. Negative  for leg swelling.  Gastrointestinal: Positive for abdominal pain (RUQ). Negative for diarrhea, nausea and vomiting.  Genitourinary: Negative for dysuria and hematuria.  Musculoskeletal: Negative for back pain and neck pain.  Skin: Negative for color change and rash.  Neurological: Negative for weakness and headaches.  All other systems reviewed and are negative.    Physical Exam Updated Vital Signs BP 114/88   Pulse (!) 102   Temp 98.6 F (37 C) (Oral)   Resp (!) 21   Ht 5\' 3"  (1.6 m)   Wt 85.3 kg   SpO2 98%   BMI 33.30 kg/m   Physical Exam  Constitutional: She appears well-developed and well-nourished. No distress.  HENT:  Head: Normocephalic and atraumatic.  Eyes: Conjunctivae are normal.  Neck: Neck supple.  Cardiovascular: Regular rhythm and intact distal pulses.  Pulses:      Radial pulses are 2+ on the right side, and 2+ on the left side.  Pulmonary/Chest: Effort normal and breath sounds normal. No respiratory distress.  Abdominal: Soft. She exhibits no distension. There is no tenderness.  No CVA ttp  Musculoskeletal: She exhibits no edema.       Right lower leg: She exhibits no edema.       Left lower leg: She  exhibits no edema.  Chest wall tenderness on right lateral side  Neurological: She is alert.  Skin: Skin is warm and dry.  Psychiatric: She has a normal mood and affect.  Nursing note and vitals reviewed.    ED Treatments / Results  Labs (all labs ordered are listed, but only abnormal results are displayed) Labs Reviewed  BASIC METABOLIC PANEL - Abnormal; Notable for the following components:      Result Value   Potassium 3.2 (*)    Glucose, Bld 103 (*)    Calcium 8.8 (*)    All other components within normal limits  URINALYSIS, ROUTINE W REFLEX MICROSCOPIC - Abnormal; Notable for the following components:   APPearance HAZY (*)    All other components within normal limits  CBC  LIPASE, BLOOD  D-DIMER, QUANTITATIVE (NOT AT [facility])  HEPATIC FUNCTION PANEL  I-STAT TROPONIN, ED  I-STAT BETA HCG BLOOD, ED ([facility], [facility], [facility] ONLY)  I-STAT TROPONIN, ED    EKG EKG Interpretation  Date/Time:  Saturday June 30 2018 16:23:37 EDT Ventricular Rate:  112 PR Interval:  142 QRS Duration: 76 QT Interval:  310 QTC Calculation: 423 R Axis:   75 Text Interpretation:  Sinus tachycardia T wave abnormality, consider inferolateral ischemia No significant change since last tracing Confirmed by Gwyneth Sprout ([id number]) on 06/30/2018 4:47:53 PM   Radiology Dg Chest 2 View  Result Date: 06/30/2018 CLINICAL DATA:  Mid chest pain starting yesterday EXAM: CHEST - 2 VIEW COMPARISON:  Apr 19, 2018, June 04, 2017, January 21, 2016 FINDINGS: The heart size and mediastinal contours are within normal limits. Chronic scarring is noted in bilateral upper to mid lung unchanged compared to multiple prior exams. There is no focal infiltrate, pulmonary edema, or pleural effusion. The visualized skeletal structures are stable. IMPRESSION: Chronic scarring is identified in bilateral upper to mid lung unchanged compared to multiple prior exams. No focal pneumonia or acute abnormality is identified. Electronically Signed    By: Sherian Rein M.D.   On: 06/30/2018 17:34    Procedures Procedures (including critical care time)  Medications Ordered in ED Medications  oxyCODONE (Oxy IR/ROXICODONE) immediate release tablet 10 mg (10 mg Oral Given 06/30/18 1912)  ondansetron (ZOFRAN-ODT) disintegrating tablet  4 mg (4 mg Oral Given 06/30/18 1912)  potassium chloride SA (K-DUR,KLOR-CON) CR tablet 40 mEq (40 mEq Oral Given 06/30/18 2126)     Initial Impression / Assessment and Plan / ED Course  I have reviewed the triage vital signs and the nursing notes.  Pertinent labs & imaging results that were available during my care of the patient were reviewed by me and considered in my medical decision making (see chart for details).    27 year old female with history of lupus, hypertension, pulmonary hypertension, and Sjogren's syndrome presents to the emergency department today for evaluation of chest pains as well as right upper quadrant abdominal pain.   Patient afebrile, heart rate low 100s, stable blood pressure.  Nontoxic-appearing.  Appears comfortable.  Exam as detailed above.  Most of right-sided chest wall tenderness.  No rashes. ECG obtained with right approximate 110, sinus tach, normal axis, intervals within normal limits, does have nonspecific T wave changes throughout with no ST elevations or depressions. Has had similar T wave changes on prior ECGs as well including January 2019. Very atypical for ACS. Will obtain delta troponins however very little concern for ACS. Given history lupus, dimer obtained that is within normal limits. Reproducibility makes PE less likely. Is positional. Bedside ultrasound with no pericardial effusion. Findings not consistent with pericarditis. Very minimal abdominal tenderness. Lipase wnl. Not pancreatitis. Trop x2 in ED wnl. Not pregnant. UA with no evidence of infection. No history nephrolithiasis. Doubt cholecystitis at this time given no significant RUQ pain/murphy sign. Pt feeling  better after pain meds in ED. Has had similar presentations with lupus flares in past. Does not typically have increase in steroids per pt (takes prednisone 10mg  daily). Pt prescribed 60 tabs percocet 7.5mg  on 7/22. Will discharge with zofran.   Case and plan of care discussed with Dr. Anitra Lauth.   Final Clinical Impressions(s) / ED Diagnoses   Final diagnoses:  Chest wall pain  Atypical chest pain     Rigoberto Noel, MD 06/30/18 [id number]    Gwyneth Sprout, MD 06/30/18 2312

## 2018-06-30 NOTE — ED Notes (Signed)
Unable to get accurate set of vitals, pt uncooperative bouncing child on lap while attempting to get blood pressure.

## 2018-06-30 NOTE — ED Notes (Signed)
Patient transported to X-ray 

## 2018-07-04 ENCOUNTER — Other Ambulatory Visit: Payer: Medicare Other

## 2018-07-04 ENCOUNTER — Ambulatory Visit: Payer: Medicare Other | Admitting: Women's Health

## 2018-07-09 ENCOUNTER — Other Ambulatory Visit: Payer: Self-pay | Admitting: Women's Health

## 2018-07-09 DIAGNOSIS — R102 Pelvic and perineal pain: Secondary | ICD-10-CM

## 2018-07-18 ENCOUNTER — Ambulatory Visit (INDEPENDENT_AMBULATORY_CARE_PROVIDER_SITE_OTHER): Payer: Medicare Other | Admitting: Women's Health

## 2018-07-18 ENCOUNTER — Ambulatory Visit: Payer: Medicare Other | Admitting: Women's Health

## 2018-07-18 ENCOUNTER — Encounter: Payer: Self-pay | Admitting: Women's Health

## 2018-07-18 ENCOUNTER — Ambulatory Visit (INDEPENDENT_AMBULATORY_CARE_PROVIDER_SITE_OTHER): Payer: Medicare Other

## 2018-07-18 ENCOUNTER — Other Ambulatory Visit: Payer: Medicare Other

## 2018-07-18 VITALS — BP 126/80

## 2018-07-18 DIAGNOSIS — R1030 Lower abdominal pain, unspecified: Secondary | ICD-10-CM | POA: Diagnosis not present

## 2018-07-18 DIAGNOSIS — R102 Pelvic and perineal pain: Secondary | ICD-10-CM | POA: Diagnosis not present

## 2018-07-18 NOTE — Progress Notes (Signed)
27 year old SPF presents for ultrasound persistent low abdominal cramping.  06/19/2018 at  last office visit reported intermittent mid lower abdominal cramping, history of cyst in the past..  Had a negative test to cure chlamydia July 2019 has not been sexually active since.    [facility]  Denies constipation, vaginal discharge, urinary symptoms, spotting between cycles or fever.  12/2016 Nexplanon amenorrheic.  Jamison 9 months thriving.  Exam: Appears well.  Ultrasound: T/V retroverted uterus homogeneous.  Endometrium 3.5 mm.  Negative cul-de-sac.  Right ovary normal.  Left ovary normal.  No apparent mass seen in right or left adnexal.  Positive color flow to both right and left ovary.  Normal GYN ultrasound Probable GI cramping  Plan: Ultrasound report reviewed normality of exam.  Reviewed possible GI related cramping, encouraged to decrease spicy/gas producing type foods.

## 2018-07-18 NOTE — Progress Notes (Signed)
iscus

## 2018-07-20 ENCOUNTER — Other Ambulatory Visit: Payer: Self-pay | Admitting: Women's Health

## 2018-07-20 MED ORDER — FLUCONAZOLE 150 MG PO TABS: 150.0000 mg | ORAL_TABLET | Freq: Once | ORAL | 1 refills | Status: AC

## 2018-08-30 ENCOUNTER — Ambulatory Visit (INDEPENDENT_AMBULATORY_CARE_PROVIDER_SITE_OTHER): Payer: Medicare Other | Admitting: Obstetrics & Gynecology

## 2018-08-30 ENCOUNTER — Encounter: Payer: Self-pay | Admitting: Obstetrics & Gynecology

## 2018-08-30 DIAGNOSIS — R350 Frequency of micturition: Secondary | ICD-10-CM | POA: Diagnosis not present

## 2018-08-30 MED ORDER — NITROFURANTOIN MONOHYD MACRO 100 MG PO CAPS: 100.0000 mg | ORAL_CAPSULE | Freq: Two times a day (BID) | ORAL | 0 refills | Status: AC

## 2018-08-30 NOTE — Patient Instructions (Signed)
1. Frequency of urination Probable acute cystitis per clinical presentation and urine analysis.  Decision to treat with Macrobid 1 tablet twice a day for 7 days.  Pending urine culture and sensitivities.  Macrobid usage known to patient.  Prescription sent to pharmacy. - Urinalysis,Complete w/RFL Culture  Other orders - nitrofurantoin, macrocrystal-monohydrate, (MACROBID) 100 MG capsule; Take 1 capsule (100 mg total) by mouth 2 (two) times daily for 7 days.  Kayla [name], it was a pleasure meeting you today!  I will inform you of your urine culture results as soon as they are available.

## 2018-08-30 NOTE — Progress Notes (Signed)
    Kayla [name] [birth year] [id number]        27 y.o.  [id number]   RP: Urinary frequency and burning with urination for 3 days  HPI: Patient has about 3 episodes of cystitis per year.  Not associated with sexual activity.  No history of acute pyelonephritis.  Currently experiencing urinary frequency and burning with urination which started 3 days ago.  Mild lower abdominal discomfort.  No back or flank pain.  No blood in urine.  No vaginal discharge.  No fever.   OB History  Gravida Para Term Preterm AB Living  3 2 1 1 1 1   SAB TAB Ectopic Multiple Live Births  1 0 0 0 1    # Outcome Date GA Lbr Len/2nd Weight Sex Delivery Anes PTL Lv  3 Term 09/13/17 [redacted]w[redacted]d 179:01 / 00:41 5 lb 13.7 oz (2.655 kg) F Vag-Spont EPI  LIV  2 Preterm 06/2015 [redacted]w[redacted]d   F    FD  1 SAB  [redacted]w[redacted]d    SAB       Past medical history,surgical history, problem list, medications, allergies, family history and social history were all reviewed and documented in the EPIC chart.   Directed ROS with pertinent positives and negatives documented in the history of present illness/assessment and plan.  Exam:  There were no vitals filed for this visit. General appearance:  Normal  CVAT negative bilaterally  Abdomen: Normal  Gynecologic exam: Deferred  U/A: Dark yellow, cloudy, nitrites negative, white blood cells 6-10, red blood cells 10-20, many bacteria.  Urine culture pending.   Assessment/Plan:  27 y.o. [id number]   1. Frequency of urination Probable acute cystitis per clinical presentation and urine analysis.  Decision to treat with Macrobid 1 tablet twice a day for 7 days.  Pending urine culture and sensitivities.  Macrobid usage known to patient.  Prescription sent to pharmacy. - Urinalysis,Complete w/RFL Culture  Other orders - nitrofurantoin, macrocrystal-monohydrate, (MACROBID) 100 MG capsule; Take 1 capsule (100 mg total) by mouth 2 (two) times daily for 7 days.  Counseling on above issues and coordination  of care more than 50% for 15 minutes.  Genia Del MD, 10:11 AM 08/30/2018

## 2018-09-01 LAB — URINALYSIS, COMPLETE W/RFL CULTURE
Bilirubin Urine: NEGATIVE
GLUCOSE, UA: NEGATIVE
Hyaline Cast: NONE SEEN /LPF
Ketones, ur: NEGATIVE
Leukocyte Esterase: NEGATIVE
Nitrites, Initial: NEGATIVE
Protein, ur: NEGATIVE
Specific Gravity, Urine: 1.023 (ref 1.001–1.03)
pH: 6.5 (ref 5.0–8.0)

## 2018-09-01 LAB — URINE CULTURE
MICRO NUMBER: 91225169
SPECIMEN QUALITY: ADEQUATE

## 2018-09-01 LAB — CULTURE INDICATED

## 2018-09-03 ENCOUNTER — Encounter: Payer: Self-pay | Admitting: *Deleted

## 2018-09-05 NOTE — Telephone Encounter (Signed)
Dr.Lavoie okay to send in diflucan tablet to take after antibiotic?

## 2018-09-06 MED ORDER — FLUCONAZOLE 150 MG PO TABS: 150.0000 mg | ORAL_TABLET | Freq: Every day | ORAL | 0 refills | Status: DC

## 2018-10-15 ENCOUNTER — Other Ambulatory Visit: Payer: Self-pay | Admitting: Women's Health

## 2018-10-15 DIAGNOSIS — R350 Frequency of micturition: Secondary | ICD-10-CM

## 2018-10-16 ENCOUNTER — Other Ambulatory Visit: Payer: Medicare Other

## 2018-10-16 DIAGNOSIS — R350 Frequency of micturition: Secondary | ICD-10-CM

## 2018-10-18 LAB — URINALYSIS, COMPLETE W/RFL CULTURE
Bilirubin Urine: NEGATIVE
GLUCOSE, UA: NEGATIVE
Hyaline Cast: NONE SEEN /LPF
Leukocyte Esterase: NEGATIVE
NITRITES URINE, INITIAL: NEGATIVE
Specific Gravity, Urine: 1.041 — ABNORMAL HIGH (ref 1.001–1.03)
pH: 5.5 (ref 5.0–8.0)

## 2018-10-18 LAB — CULTURE INDICATED

## 2018-10-18 LAB — URINE CULTURE
MICRO NUMBER:: 91430344
SPECIMEN QUALITY:: ADEQUATE

## 2018-11-07 ENCOUNTER — Other Ambulatory Visit: Payer: Self-pay | Admitting: Women's Health

## 2018-11-07 MED ORDER — FLUCONAZOLE 150 MG PO TABS: ORAL_TABLET | ORAL | 0 refills | Status: DC

## 2018-11-23 ENCOUNTER — Ambulatory Visit: Payer: Medicare Other | Admitting: Gynecology

## 2018-11-26 ENCOUNTER — Ambulatory Visit: Payer: Medicare Other | Admitting: Women's Health

## 2018-11-28 ENCOUNTER — Encounter: Payer: Self-pay | Admitting: Women's Health

## 2018-11-28 ENCOUNTER — Ambulatory Visit (INDEPENDENT_AMBULATORY_CARE_PROVIDER_SITE_OTHER): Payer: Medicare Other | Admitting: Women's Health

## 2018-11-28 VITALS — BP 124/78

## 2018-11-28 DIAGNOSIS — B3731 Acute candidiasis of vulva and vagina: Secondary | ICD-10-CM

## 2018-11-28 DIAGNOSIS — Z113 Encounter for screening for infections with a predominantly sexual mode of transmission: Secondary | ICD-10-CM | POA: Diagnosis not present

## 2018-11-28 DIAGNOSIS — R350 Frequency of micturition: Secondary | ICD-10-CM | POA: Diagnosis not present

## 2018-11-28 DIAGNOSIS — B373 Candidiasis of vulva and vagina: Secondary | ICD-10-CM

## 2018-11-28 LAB — WET PREP FOR TRICH, YEAST, CLUE

## 2018-11-28 MED ORDER — TERCONAZOLE 0.8 % VA CREA: 1.0000 | TOPICAL_CREAM | Freq: Every day | VAGINAL | 1 refills | Status: DC

## 2018-11-28 NOTE — Progress Notes (Signed)
28 year old SBF G3 P1 with complaint of vaginal itching, used over-the-counter Monistat last week minimal relief.  Mild urinary frequency without pain or burning, denies back/abdominal pain or fever.Marland Kitchen  Has had 2 rounds of antibiotics for recent respiratory infection.  Amenorrheic on Nexplanon placed 08/2017.  History of positive chlamydia test 05/2018, did not have a test of cure.  Not sexually active since prior to July 2019.  Medical problems include lupus causing pulmonary hypertension on numerous medications and daily prednisone, [facility] manages.   Exam: Appears well, obese.  No CVAT.  External genitalia minimal erythema at introitus, speculum exam vaginal walls erythemic without visible discharge, wet prep negative.  GC/Chlamydia culture taken.  Bimanual no CMT or adnexal tenderness. UA:: +2 protein, trace ketones, no WBCs, no RBCs, 6-10 squamous epithelials, moderate bacteria  Clinical yeast  Plan: Options reviewed, would like a cream, Terazol 3  1 application per vagina for 3 nights prescription, proper use given and reviewed.  Instructed to call if no relief.  Urine culture pending.  GC/Chlamydia culture pending.  Reviewed importance of increasing fluids.

## 2018-11-28 NOTE — Patient Instructions (Signed)
Vaginal Yeast infection, Adult    Vaginal yeast infection is a condition that causes vaginal discharge as well as soreness, swelling, and redness (inflammation) of the vagina. This is a common condition. Some women get this infection frequently.  What are the causes?  This condition is caused by a change in the normal balance of the yeast (candida) and bacteria that live in the vagina. This change causes an overgrowth of yeast, which causes the inflammation.  What increases the risk?  The condition is more likely to develop in women who:   Take antibiotic medicines.   Have diabetes.   Take birth control pills.   Are pregnant.   Douche often.   Have a weak body defense system (immune system).   Have been taking steroid medicines for a long time.   Frequently wear tight clothing.  What are the signs or symptoms?  Symptoms of this condition include:   White, thick, creamy vaginal discharge.   Swelling, itching, redness, and irritation of the vagina. The lips of the vagina (vulva) may be affected as well.   Pain or a burning feeling while urinating.   Pain during sex.  How is this diagnosed?  This condition is diagnosed based on:   Your medical history.   A physical exam.   A pelvic exam. Your health care provider will examine a sample of your vaginal discharge under a microscope. Your health care provider may send this sample for testing to confirm the diagnosis.  How is this treated?  This condition is treated with medicine. Medicines may be over-the-counter or prescription. You may be told to use one or more of the following:   Medicine that is taken by mouth (orally).   Medicine that is applied as a cream (topically).   Medicine that is inserted directly into the vagina (suppository).  Follow these instructions at home:    Lifestyle   Do not have sex until your health care provider approves. Tell your sex partner that you have a yeast infection. That person should go to his or her health care  provider and ask if they should also be treated.   Do not wear tight clothes, such as pantyhose or tight pants.   Wear breathable cotton underwear.  General instructions   Take or apply over-the-counter and prescription medicines only as told by your health care provider.   Eat more yogurt. This may help to keep your yeast infection from returning.   Do not use tampons until your health care provider approves.   Try taking a sitz bath to help with discomfort. This is a warm water bath that is taken while you are sitting down. The water should only come up to your hips and should cover your buttocks. Do this 3-4 times per day or as told by your health care provider.   Do not douche.   If you have diabetes, keep your blood sugar levels under control.   Keep all follow-up visits as told by your health care provider. This is important.  Contact a health care provider if:   You have a fever.   Your symptoms go away and then return.   Your symptoms do not get better with treatment.   Your symptoms get worse.   You have new symptoms.   You develop blisters in or around your vagina.   You have blood coming from your vagina and it is not your menstrual period.   You develop pain in your abdomen.  Summary     Vaginal yeast infection is a condition that causes discharge as well as soreness, swelling, and redness (inflammation) of the vagina.   This condition is treated with medicine. Medicines may be over-the-counter or prescription.   Take or apply over-the-counter and prescription medicines only as told by your health care provider.   Do not douche. Do not have sex or use tampons until your health care provider approves.   Contact a health care provider if your symptoms do not get better with treatment or your symptoms go away and then return.  This information is not intended to replace advice given to you by your health care provider. Make sure you discuss any questions you have with your health care  provider.  Document Released: 08/17/2005 Document Revised: 03/26/2018 Document Reviewed: 03/26/2018  [employer]vier Interactive Patient Education  2019 [employer]

## 2018-11-29 LAB — C. TRACHOMATIS/N. GONORRHOEAE RNA
C. TRACHOMATIS RNA, TMA: NOT DETECTED
N. gonorrhoeae RNA, TMA: NOT DETECTED

## 2018-11-30 LAB — URINE CULTURE
MICRO NUMBER:: 33086
SPECIMEN QUALITY:: ADEQUATE

## 2018-11-30 LAB — URINALYSIS, COMPLETE W/RFL CULTURE
BILIRUBIN URINE: NEGATIVE
Glucose, UA: NEGATIVE
Hgb urine dipstick: NEGATIVE
Hyaline Cast: NONE SEEN /LPF
Leukocyte Esterase: NEGATIVE
NITRITES URINE, INITIAL: NEGATIVE
PH: 5 (ref 5.0–8.0)
RBC / HPF: NONE SEEN /HPF (ref 0–2)
Specific Gravity, Urine: 1.04 — ABNORMAL HIGH (ref 1.001–1.03)
WBC UA: NONE SEEN /HPF (ref 0–5)

## 2018-11-30 LAB — CULTURE INDICATED

## 2018-12-03 ENCOUNTER — Other Ambulatory Visit: Payer: Self-pay | Admitting: Women's Health

## 2018-12-03 MED ORDER — NITROFURANTOIN MONOHYD MACRO 100 MG PO CAPS: 100.0000 mg | ORAL_CAPSULE | Freq: Two times a day (BID) | ORAL | 0 refills | Status: DC

## 2019-02-27 ENCOUNTER — Encounter: Payer: Self-pay | Admitting: *Deleted

## 2019-03-25 ENCOUNTER — Encounter: Payer: Self-pay | Admitting: Physician Assistant

## 2019-03-25 ENCOUNTER — Telehealth: Payer: Medicare Other | Admitting: Physician Assistant

## 2019-03-25 DIAGNOSIS — N390 Urinary tract infection, site not specified: Secondary | ICD-10-CM | POA: Diagnosis not present

## 2019-03-25 DIAGNOSIS — M545 Low back pain, unspecified: Secondary | ICD-10-CM

## 2019-03-25 DIAGNOSIS — R197 Diarrhea, unspecified: Secondary | ICD-10-CM | POA: Diagnosis not present

## 2019-03-25 DIAGNOSIS — R109 Unspecified abdominal pain: Secondary | ICD-10-CM

## 2019-03-25 NOTE — Progress Notes (Signed)
Based on what you shared with me, I feel your condition warrants further evaluation and I recommend that you be seen for a face to face office visit.  Ms. Coontz,  You UTI symptoms along with back pain can indicate kidney infection and therefore sample of your urine may be needed.    NOTE: If you entered your credit card information for this eVisit, you will not be charged. You may see a "hold" on your card for the $35 but that hold will drop off and you will not have a charge processed.  If you are having a true medical emergency please call 911.  If you need an urgent face to face visit, [place] has four urgent care centers for your convenience.    PLEASE NOTE: THE [facility] LOCATIONS AND URGENT CARE CLINICS DO NOT HAVE THE TESTING FOR CORONAVIRUS COVID19 AVAILABLE.  IF YOU FEEL YOU NEED THIS TEST YOU MUST GO TO A TRIAGE LOCATION AT ONE OF THE HOSPITAL EMERGENCY DEPARTMENTS   [email or web address] to reserve your spot online an avoid wait times  [facility] [address] [place], [place] [address] Modified hours of operation: Monday-Friday, 12 PM to 6 PM  Saturday & Sunday 10 AM to 4 PM *Across the street from [employer]  [facility] (New Address!) [address], [address] [place], [place] [address] *Just off [employer], across the road from [place]* Modified hours of operation: Monday-Friday, 12 PM to 6 PM  Closed Saturday & Sunday  [facility]'s modified hours of operation will be in effect from May 1 until May 31   The following sites will take your insurance:  . [facility]  [phone] Get Driving Directions Find a Provider at this Location  [address] [place], [place] [address] . 10 am to 8 pm Monday-Friday . 12 pm to 8 pm Saturday-Sunday   . [facility] at [facility]  [phone] Get Driving Directions Find a Provider at this Location  [address]  [address] [place], [place] [address] . 8 am to 8 pm Monday-Friday . 9 am to 6 pm Saturday . 11 am to 6 pm Sunday   . [facility] at [facility]  [phone] Get Driving Directions  [address] [place], [place] [address] . 8 am to 8 pm Monday-Friday . 8 am to 4 pm Saturday-Sunday   Your e-visit answers were reviewed by a board certified advanced clinical practitioner to complete your personal care plan.  Thank you for using e-Visits. I have spent 7 min in completion and review of this note- [facility]

## 2019-04-04 ENCOUNTER — Encounter: Payer: Self-pay | Admitting: *Deleted

## 2019-04-23 ENCOUNTER — Encounter: Payer: Self-pay | Admitting: Women's Health

## 2019-04-23 ENCOUNTER — Ambulatory Visit (INDEPENDENT_AMBULATORY_CARE_PROVIDER_SITE_OTHER): Payer: Medicare Other | Admitting: Women's Health

## 2019-04-23 ENCOUNTER — Other Ambulatory Visit: Payer: Self-pay

## 2019-04-23 VITALS — BP 122/82

## 2019-04-23 DIAGNOSIS — R350 Frequency of micturition: Secondary | ICD-10-CM | POA: Diagnosis not present

## 2019-04-23 DIAGNOSIS — N898 Other specified noninflammatory disorders of vagina: Secondary | ICD-10-CM

## 2019-04-23 LAB — WET PREP FOR TRICH, YEAST, CLUE

## 2019-04-23 MED ORDER — TERCONAZOLE 0.8 % VA CREA: 1.0000 | TOPICAL_CREAM | Freq: Every day | VAGINAL | 1 refills | Status: DC

## 2019-04-23 NOTE — Progress Notes (Signed)
28 year old SBF G3, P1 presents with complaint of white vaginal discharge with itching and questionable odor for 2 days and increased urinary frequency without pain or burning.  Reports right lateral abdominal intermittent pain.  Denies abdominal/back pain or fever.  Not sexually active for approximately 3 months, amenorrheic with Nexplanon placed 08/2017.  History of positive chlamydia 05/2018 with negative test of cure.  Chronic problems include lupus and pulmonary hypertension managed at [facility].  Reports lupus stable.  On disability, taking classes for substance abuse counseling.  Jamison 18 months doing well.  Exam: Appears well, no CVAT, abdomen obese, no rebound or radiation, external genitalia within normal limits, no visible erythema, speculum exam moderate amount of a white adherent discharge, wet prep positive for yeast.  GC/Chlamydia culture taken.  Bimanual no CMT or adnexal tenderness. UA: Trace ketones, +1 protein, negative nitrites, negative leukocytes, 0-5 WBCs, no RBCs, 10-20 squamous epithelials, moderate bacteria  Yeast vaginitis  Plan: Terazol 3 1 applicator at bedtime x3, prescription, proper use given and reviewed.  Instructed to call if no relief.  Urine culture pending.  Yeast prevention discussed.

## 2019-04-23 NOTE — Patient Instructions (Signed)
Vaginal Yeast infection, Adult  Vaginal yeast infection is a condition that causes vaginal discharge as well as soreness, swelling, and redness (inflammation) of the vagina. This is a common condition. Some women get this infection frequently. What are the causes? This condition is caused by a change in the normal balance of the yeast (candida) and bacteria that live in the vagina. This change causes an overgrowth of yeast, which causes the inflammation. What increases the risk? The condition is more likely to develop in women who:  Take antibiotic medicines.  Have diabetes.  Take birth control pills.  Are pregnant.  Douche often.  Have a weak body defense system (immune system).  Have been taking steroid medicines for a long time.  Frequently wear tight clothing. What are the signs or symptoms? Symptoms of this condition include:  White, thick, creamy vaginal discharge.  Swelling, itching, redness, and irritation of the vagina. The lips of the vagina (vulva) may be affected as well.  Pain or a burning feeling while urinating.  Pain during sex. How is this diagnosed? This condition is diagnosed based on:  Your medical history.  A physical exam.  A pelvic exam. Your health care provider will examine a sample of your vaginal discharge under a microscope. Your health care provider may send this sample for testing to confirm the diagnosis. How is this treated? This condition is treated with medicine. Medicines may be over-the-counter or prescription. You may be told to use one or more of the following:  Medicine that is taken by mouth (orally).  Medicine that is applied as a cream (topically).  Medicine that is inserted directly into the vagina (suppository). Follow these instructions at home:  Lifestyle  Do not have sex until your health care provider approves. Tell your sex partner that you have a yeast infection. That person should go to his or her health care  provider and ask if they should also be treated.  Do not wear tight clothes, such as pantyhose or tight pants.  Wear breathable cotton underwear. General instructions  Take or apply over-the-counter and prescription medicines only as told by your health care provider.  Eat more yogurt. This may help to keep your yeast infection from returning.  Do not use tampons until your health care provider approves.  Try taking a sitz bath to help with discomfort. This is a warm water bath that is taken while you are sitting down. The water should only come up to your hips and should cover your buttocks. Do this 3-4 times per day or as told by your health care provider.  Do not douche.  If you have diabetes, keep your blood sugar levels under control.  Keep all follow-up visits as told by your health care provider. This is important. Contact a health care provider if:  You have a fever.  Your symptoms go away and then return.  Your symptoms do not get better with treatment.  Your symptoms get worse.  You have new symptoms.  You develop blisters in or around your vagina.  You have blood coming from your vagina and it is not your menstrual period.  You develop pain in your abdomen. Summary  Vaginal yeast infection is a condition that causes discharge as well as soreness, swelling, and redness (inflammation) of the vagina.  This condition is treated with medicine. Medicines may be over-the-counter or prescription.  Take or apply over-the-counter and prescription medicines only as told by your health care provider.  Do not douche.   Do not have sex or use tampons until your health care provider approves.  Contact a health care provider if your symptoms do not get better with treatment or your symptoms go away and then return. This information is not intended to replace advice given to you by your health care provider. Make sure you discuss any questions you have with your health care  provider. Document Released: 08/17/2005 Document Revised: 03/26/2018 Document Reviewed: 03/26/2018 Elsevier Interactive Patient Education  2019 [employer]. Intrauterine Device Information An intrauterine device (IUD) is a medical device that is inserted in the uterus to prevent pregnancy. It is a small, T-shaped device that has one or two nylon strings hanging down from it. The strings hang out of the lower part of the uterus (cervix) to allow for future IUD removal. There are two types of IUDs available:  Hormone IUD. This type of IUD is made of plastic and contains the hormone progestin (synthetic progesterone). A hormone IUD may last 3-5 years.  Copper IUD. This type of IUD has copper wire wrapped around it. A copper IUD may last up to 10 years. How is an IUD inserted? An IUD is inserted through the vagina and placed into the uterus with a minor medical procedure. The exact procedure for IUD insertion may vary among health care providers and hospitals. How does an IUD work? Synthetic progesterone in a hormonal IUD prevents pregnancy by:  Thickening cervical mucus to prevent sperm from entering the uterus.  Thinning the uterine lining to prevent a fertilized egg from being implanted there. Copper in a copper IUD prevents pregnancy by making the uterus and fallopian tubes produce a fluid that kills sperm. What are the advantages of an IUD? Advantages of either type of IUD  It is highly effective in preventing pregnancy.  It is reversible. You can become pregnant shortly after the IUD is removed.  It is low-maintenance and can stay in place for a long time.  There are no estrogen-related side effects.  It can be used when breastfeeding.  It is not associated with weight gain.  It can be inserted right after childbirth, an abortion, or a miscarriage. Advantages of a hormone IUD  If it is inserted within 7 days of your period starting, it works right after it is inserted. If the  hormone IUD is inserted at any other time in your cycle, you will need to use a backup method of birth control for 7 days after insertion.  It can make menstrual periods lighter.  It can reduce menstrual cramping.  It can be used for 3-5 years. Advantages of a copper IUD  It works right after it is inserted.  It can be used as a form of emergency birth control if it is inserted within 5 days after having unprotected sex.  It does not interfere with your body's natural hormones.  It can be used for 10 years. What are the disadvantages of an IUD?  An IUD may cause irregular menstrual bleeding for a period of time after insertion.  You may have pain during insertion and have cramping and vaginal bleeding after insertion.  An IUD may cut the uterus (uterine perforation) when it is inserted. This is rare.  An IUD may cause pelvic inflammatory disease (PID), which is an infection in the uterus and fallopian tubes. This is rare, and it usually happens during the first 20 days after the IUD is inserted.  A copper IUD can make your menstrual flow heavier and more  painful. How is an IUD removed?  You will lie on your back with your knees bent and your feet in footrests (stirrups).  A device will be inserted into your vagina to spread apart the vaginal walls (speculum). This will allow your health care provider to see the strings attached to the IUD.  Your health care provider will use a small instrument (forceps) to grasp the IUD strings and pull firmly until the IUD is removed. You may have some discomfort when the IUD is removed. Your health care provider may recommend taking over-the-counter pain relievers, such as ibuprofen, before the procedure. You may also have minor spotting for a few days after the procedure. The exact procedure for IUD removal may vary among health care providers and hospitals. Is the IUD right for me? Your health care provider will make sure you are a good  candidate for an IUD and will discuss the advantages, disadvantages, and possible side effects with you. Summary  An intrauterine device (IUD) is a medical device that is inserted in the uterus to prevent pregnancy. It is a small, T-shaped device that has one or two nylon strings hanging down from it.  A hormone IUD contains the hormone progestin (synthetic progesterone). A copper IUD has copper wire wrapped around it.  Synthetic progesterone in a hormone IUD prevents pregnancy by thickening cervical mucus and thinning the walls of the uterus. Copper in a copper IUD prevents pregnancy by making the uterus and fallopian tubes produce a fluid that kills sperm.  A hormone IUD can be left in place for 3-5 years. A copper IUD can be left in place for up to 10 years.  An IUD is inserted and removed by a health care provider. You may feel some pain during insertion and removal. Your health care provider may recommend taking over-the-counter pain medicine, such as ibuprofen, before an IUD procedure. This information is not intended to replace advice given to you by your health care provider. Make sure you discuss any questions you have with your health care provider. Document Released: 10/11/2004 Document Revised: 12/06/2016 Document Reviewed: 12/06/2016 Elsevier Interactive Patient Education  2019 [employer]. Levonorgestrel intrauterine device (IUD) What is this medicine? LEVONORGESTREL IUD (LEE voe nor jes trel) is a contraceptive (birth control) device. The device is placed inside the uterus by a healthcare professional. It is used to prevent pregnancy. This device can also be used to treat heavy bleeding that occurs during your period. This medicine may be used for other purposes; ask your health care provider or pharmacist if you have questions. COMMON BRAND NAME(S): Cameron Ali What should I tell my health care provider before I take this medicine? They need to know if you  have any of these conditions: -abnormal Pap smear -cancer of the breast, uterus, or cervix -diabetes -endometritis -genital or pelvic infection now or in the past -have more than one sexual partner or your partner has more than one partner -heart disease -history of an ectopic or tubal pregnancy -immune system problems -IUD in place -liver disease or tumor -problems with blood clots or take blood-thinners -seizures -use intravenous drugs -uterus of unusual shape -vaginal bleeding that has not been explained -an unusual or allergic reaction to levonorgestrel, other hormones, silicone, or polyethylene, medicines, foods, dyes, or preservatives -pregnant or trying to get pregnant -breast-feeding How should I use this medicine? This device is placed inside the uterus by a health care professional. Talk to your pediatrician regarding the use of this  medicine in children. Special care may be needed. Overdosage: If you think you have taken too much of this medicine contact a poison control center or emergency room at once. NOTE: This medicine is only for you. Do not share this medicine with others. What if I miss a dose? This does not apply. Depending on the brand of device you have inserted, the device will need to be replaced every 3 to 5 years if you wish to continue using this type of birth control. What may interact with this medicine? Do not take this medicine with any of the following medications: -amprenavir -bosentan -fosamprenavir This medicine may also interact with the following medications: -aprepitant -armodafinil -barbiturate medicines for inducing sleep or treating seizures -bexarotene -boceprevir -griseofulvin -medicines to treat seizures like carbamazepine, ethotoin, felbamate, oxcarbazepine, phenytoin, topiramate -modafinil -pioglitazone -rifabutin -rifampin -rifapentine -some medicines to treat HIV infection like atazanavir, efavirenz, indinavir, lopinavir,  nelfinavir, tipranavir, ritonavir -St. John's wort -warfarin This list may not describe all possible interactions. Give your health care provider a list of all the medicines, herbs, non-prescription drugs, or dietary supplements you use. Also tell them if you smoke, drink alcohol, or use illegal drugs. Some items may interact with your medicine. What should I watch for while using this medicine? Visit your doctor or health care professional for regular check ups. See your doctor if you or your partner has sexual contact with others, becomes HIV positive, or gets a sexual transmitted disease. This product does not protect you against HIV infection (AIDS) or other sexually transmitted diseases. You can check the placement of the IUD yourself by reaching up to the top of your vagina with clean fingers to feel the threads. Do not pull on the threads. It is a good habit to check placement after each menstrual period. Call your doctor right away if you feel more of the IUD than just the threads or if you cannot feel the threads at all. The IUD may come out by itself. You may become pregnant if the device comes out. If you notice that the IUD has come out use a backup birth control method like condoms and call your health care provider. Using tampons will not change the position of the IUD and are okay to use during your period. This IUD can be safely scanned with magnetic resonance imaging (MRI) only under specific conditions. Before you have an MRI, tell your healthcare provider that you have an IUD in place, and which type of IUD you have in place. What side effects may I notice from receiving this medicine? Side effects that you should report to your doctor or health care professional as soon as possible: -allergic reactions like skin rash, itching or hives, swelling of the face, lips, or tongue -fever, flu-like symptoms -genital sores -high blood pressure -no menstrual period for 6 weeks during use  -pain, swelling, warmth in the leg -pelvic pain or tenderness -severe or sudden headache -signs of pregnancy -stomach cramping -sudden shortness of breath -trouble with balance, talking, or walking -unusual vaginal bleeding, discharge -yellowing of the eyes or skin Side effects that usually do not require medical attention (report to your doctor or health care professional if they continue or are bothersome): -acne -breast pain -change in sex drive or performance -changes in weight -cramping, dizziness, or faintness while the device is being inserted -headache -irregular menstrual bleeding within first 3 to 6 months of use -nausea This list may not describe all possible side effects. Call your doctor for  medical advice about side effects. You may report side effects to FDA at 1-800-FDA-1088. Where should I keep my medicine? This does not apply. NOTE: This sheet is a summary. It may not cover all possible information. If you have questions about this medicine, talk to your doctor, pharmacist, or health care provider.  2019 Elsevier/Gold Standard (2016-08-19 14:14:56)

## 2019-04-24 ENCOUNTER — Encounter: Payer: Self-pay | Admitting: Women's Health

## 2019-04-24 ENCOUNTER — Other Ambulatory Visit: Payer: Self-pay | Admitting: Women's Health

## 2019-04-24 MED ORDER — ONDANSETRON HCL 4 MG PO TABS: 4.0000 mg | ORAL_TABLET | Freq: Three times a day (TID) | ORAL | 0 refills | Status: DC | PRN

## 2019-04-25 LAB — URINALYSIS, COMPLETE W/RFL CULTURE
Bilirubin Urine: NEGATIVE
Glucose, UA: NEGATIVE
Hgb urine dipstick: NEGATIVE
Hyaline Cast: NONE SEEN /LPF
Leukocyte Esterase: NEGATIVE
Nitrites, Initial: NEGATIVE
RBC / HPF: NONE SEEN /HPF (ref 0–2)
Specific Gravity, Urine: 1.029 (ref 1.001–1.03)
pH: 5.5 (ref 5.0–8.0)

## 2019-04-25 LAB — URINE CULTURE
MICRO NUMBER:: 532615
SPECIMEN QUALITY:: ADEQUATE

## 2019-04-25 LAB — CULTURE INDICATED

## 2019-04-25 LAB — C. TRACHOMATIS/N. GONORRHOEAE RNA
C. trachomatis RNA, TMA: NOT DETECTED
N. gonorrhoeae RNA, TMA: NOT DETECTED

## 2019-04-26 ENCOUNTER — Other Ambulatory Visit: Payer: Self-pay

## 2019-04-29 ENCOUNTER — Encounter: Payer: Self-pay | Admitting: Obstetrics & Gynecology

## 2019-04-29 ENCOUNTER — Other Ambulatory Visit: Payer: Self-pay

## 2019-04-29 ENCOUNTER — Ambulatory Visit (INDEPENDENT_AMBULATORY_CARE_PROVIDER_SITE_OTHER): Payer: Medicare Other | Admitting: Obstetrics & Gynecology

## 2019-04-29 VITALS — BP 124/70

## 2019-04-29 DIAGNOSIS — Z3049 Encounter for surveillance of other contraceptives: Secondary | ICD-10-CM | POA: Diagnosis not present

## 2019-04-29 DIAGNOSIS — M3213 Lung involvement in systemic lupus erythematosus: Secondary | ICD-10-CM

## 2019-04-29 DIAGNOSIS — Z3046 Encounter for surveillance of implantable subdermal contraceptive: Secondary | ICD-10-CM | POA: Diagnosis not present

## 2019-04-29 NOTE — Progress Notes (Signed)
    Kayla [name] [birth year] [id number]        28 y.o.  [id number] Single.  Daughter 1 yr and 9 months.  RP: [id number] removal re severe Lupus with lung involvement  HPI: Nexplanon inserted 08/2017.  Currently abstinent.  Severe SLE with lung involvement, changing medication currently, followed at [facility].   OB History  Gravida Para Term Preterm AB Living  3 2 1 1 1 1   SAB TAB Ectopic Multiple Live Births  1 0 0 0 1    # Outcome Date GA Lbr Len/2nd Weight Sex Delivery Anes PTL Lv  3 Term 09/13/17 [redacted]w[redacted]d 179:01 / 00:41 5 lb 13.7 oz (2.655 kg) F Vag-Spont EPI  LIV  2 Preterm 06/2015 [redacted]w[redacted]d   F    FD  1 SAB  [redacted]w[redacted]d    SAB       Past medical history,surgical history, problem list, medications, allergies, family history and social history were all reviewed and documented in the EPIC chart.   Directed ROS with pertinent positives and negatives documented in the history of present illness/assessment and plan.  Exam:  Vitals:   04/29/19 0856  BP: 124/70   General appearance:  Normal                                                             Nexplanon procedure note (removal)  The patient presented to the office today requesting for removal of her Nexplanon that was placed in the year 08/2017 on her left arm.   On examination the nexplanon implant was palpated and the distal end  (end  closest to the elbow) was marked. The area was sterilized with Betadine solution. 1% lidocaine was used for local anesthesia and approximately 1 cc  was injected into the site that was marked where the incision was to be made. The local anesthetic was injected under the implant in an effort to keep it  close to the skin surface. Slight pressure pushing downward was made at the proximal end  of the implant in an effort to stabilize it. A bulge appeared indicating the distal end of the implant. A small transverse incision of 2 mm was made at that location. By gently pushing the implant toward the incision, the tip  became visible. Grasping the implant with a curved forcep facilitated in gently removing the implant. Full confirmation of the entire implant which is 4 cm long was inspected and was intact and was shown to the patient and discarded. After removing the implant, the incision was closed with 1 Steri-Strip, a band-aid and a bandage. Patient will be instructed to remove the pressure bandage in 24 hours, the band-aid in 3 days and the Steri-Strip in 7 days.   Assessment/Plan:  28 y.o. [id number]   1. Encounter for Nexplanon removal Counseling on Nexplanon removal and contraception done.  Procedure explained.  No complication and well-tolerated by the patient.  Currently abstinent.  Progestin IUD and copper IUD reviewed with patient, not interested at this point.  Post removal of Nexplanon precautions reviewed.  2. Systemic lupus erythematosus with lung involvement, unspecified SLE type ([place]) Followed at [facility].  Counseling on above issues and coordination of care more than 50% for 15 minutes.  Princess Bruins MD, 9:12 AM 04/29/2019

## 2019-04-29 NOTE — Patient Instructions (Signed)
1. Encounter for Nexplanon removal Counseling on Nexplanon removal and contraception done.  Procedure explained.  No complication and well-tolerated by the patient.  Currently abstinent.  Progestin IUD and copper IUD reviewed with patient, not interested at this point.  Post removal of Nexplanon precautions reviewed.  2. Systemic lupus erythematosus with lung involvement, unspecified SLE type ([name]) Followed at [facility].  Kayla [name], it was a pleasure seeing you today!

## 2019-05-28 ENCOUNTER — Other Ambulatory Visit: Payer: Self-pay

## 2019-05-29 ENCOUNTER — Ambulatory Visit (INDEPENDENT_AMBULATORY_CARE_PROVIDER_SITE_OTHER): Payer: Medicare Other | Admitting: Women's Health

## 2019-05-29 ENCOUNTER — Encounter: Payer: Self-pay | Admitting: Women's Health

## 2019-05-29 VITALS — BP 122/84 | Ht 64.0 in | Wt 198.8 lb

## 2019-05-29 DIAGNOSIS — Z01419 Encounter for gynecological examination (general) (routine) without abnormal findings: Secondary | ICD-10-CM

## 2019-05-29 DIAGNOSIS — N898 Other specified noninflammatory disorders of vagina: Secondary | ICD-10-CM | POA: Diagnosis not present

## 2019-05-29 DIAGNOSIS — R350 Frequency of micturition: Secondary | ICD-10-CM | POA: Diagnosis not present

## 2019-05-29 LAB — WET PREP FOR TRICH, YEAST, CLUE

## 2019-05-29 MED ORDER — FLUCONAZOLE 150 MG PO TABS: 150.0000 mg | ORAL_TABLET | Freq: Once | ORAL | 0 refills | Status: AC

## 2019-05-29 NOTE — Progress Notes (Signed)
Kayla [name] 1990-12-30 [id number]    History:    Presents for breast and pelvic exam.  Normal Pap history.  Pulmonary hypertension from lupus.  Also struggling with some anxiety and depression.  Theodoro Clock 44-month-old daughter doing well.  04/2019 Nexplanon removed.  Currently not sexually active has follow-up at [facility] for lupus in a few weeks and will discuss what types of contraception acceptable.  Has had vaginal itching used over-the-counter Monistat minimal relief.  Past medical history, past surgical history, family history and social history were all reviewed and documented in the EPIC chart.  On disability, has good family support.  Had respiratory failure after delivery.  ROS:  A ROS was performed and pertinent positives and negatives are included.  Exam:  Vitals:   05/29/19 0940  BP: 122/84  Weight: 198 lb 12.8 oz (90.2 kg)  Height: 5\' 4"  (1.626 m)   Body mass index is 34.12 kg/m.   General appearance:  Normal Thyroid:  Symmetrical, normal in size, without palpable masses or nodularity. Respiratory  Auscultation:  Clear without wheezing or rhonchi Cardiovascular  Auscultation:  Regular rate, without rubs, murmurs or gallops  Edema/varicosities:  Not grossly evident Abdominal  Soft,nontender, without masses, guarding or rebound.  Liver/spleen:  No organomegaly noted  Hernia:  None appreciated  Skin  Inspection:  Grossly normal   Breasts: Examined lying and sitting.     Right: Without masses, retractions, discharge or axillary adenopathy.     Left: Without masses, retractions, discharge or axillary adenopathy. Gentitourinary   Inguinal/mons:  Normal without inguinal adenopathy  External genitalia:  Normal  BUS/Urethra/Skene's glands:  Normal  Vagina:  Normal  Cervix:  Normal  Uterus:   normal in size, shape and contour.  Midline and mobile  Adnexa/parametria:     Rt: Without masses or tenderness.   Lt: Without masses or tenderness.  Anus and  perineum: Normal  Digital rectal exam: Normal sphincter tone without palpated masses or tenderness  Assessment/Plan:  28 y.o. SBF G3, P1 for breast and pelvic exam with complaint of vaginal itching and discharge without odor.  Vaginal itching/normal wet prep 04/2019 Nexplanon removed/contraception management Lupus/pulmonary hypertension- [facility] manages Obesity  Plan: Diflucan 150 p.o. x1 dose prescription given, instructed to call if continued vaginal itching.  Contraception options reviewed, IUDs discussed will check with [facility] and return to office with preference.  Reviewed importance of no pregnancy.  Currently not sexually active, condoms encouraged.  SBEs, exercise, calcium rich foods, MVI daily encouraged.  Reviewed importance of increasing activity and decreasing calorie/carbs.  Pap normal 2019, new screening guidelines reviewed.   [place], 9:42 AM 05/29/2019

## 2019-05-29 NOTE — Patient Instructions (Addendum)
Health Maintenance, Female Adopting a healthy lifestyle and getting preventive care are important in promoting health and wellness. Ask your health care provider about:  The right schedule for you to have regular tests and exams.  Things you can do on your own to prevent diseases and keep yourself healthy. What should I know about diet, weight, and exercise? Eat a healthy diet   Eat a diet that includes plenty of vegetables, fruits, low-fat dairy products, and lean protein.  Do not eat a lot of foods that are high in solid fats, added sugars, or sodium. Maintain a healthy weight Body mass index (BMI) is used to identify weight problems. It estimates body fat based on height and weight. Your health care provider can help determine your BMI and help you achieve or maintain a healthy weight. Get regular exercise Get regular exercise. This is one of the most important things you can do for your health. Most adults should:  Exercise for at least 150 minutes each week. The exercise should increase your heart rate and make you sweat (moderate-intensity exercise).  Do strengthening exercises at least twice a week. This is in addition to the moderate-intensity exercise.  Spend less time sitting. Even light physical activity can be beneficial. Watch cholesterol and blood lipids Have your blood tested for lipids and cholesterol at 29 years of age, then have this test every 5 years. Have your cholesterol levels checked more often if:  Your lipid or cholesterol levels are high.  You are older than 29 years of age.  You are at high risk for heart disease. What should I know about cancer screening? Depending on your health history and family history, you may need to have cancer screening at various ages. This may include screening for:  Breast cancer.  Cervical cancer.  Colorectal cancer.  Skin cancer.  Lung cancer. What should I know about heart disease, diabetes, and high blood  pressure? Blood pressure and heart disease  High blood pressure causes heart disease and increases the risk of stroke. This is more likely to develop in people who have high blood pressure readings, are of African descent, or are overweight.  Have your blood pressure checked: ? Every 3-5 years if you are 18-39 years of age. ? Every year if you are 40 years old or older. Diabetes Have regular diabetes screenings. This checks your fasting blood sugar level. Have the screening done:  Once every three years after age 40 if you are at a normal weight and have a low risk for diabetes.  More often and at a younger age if you are overweight or have a high risk for diabetes. What should I know about preventing infection? Hepatitis B If you have a higher risk for hepatitis B, you should be screened for this virus. Talk with your health care provider to find out if you are at risk for hepatitis B infection. Hepatitis C Testing is recommended for:  Everyone born from 1945 through 1965.  Anyone with known risk factors for hepatitis C. Sexually transmitted infections (STIs)  Get screened for STIs, including gonorrhea and chlamydia, if: ? You are sexually active and are younger than 29 years of age. ? You are older than 29 years of age and your health care provider tells you that you are at risk for this type of infection. ? Your sexual activity has changed since you were last screened, and you are at increased risk for chlamydia or gonorrhea. Ask your health care provider if   you are at risk.  Ask your health care provider about whether you are at high risk for HIV. Your health care provider may recommend a prescription medicine to help prevent HIV infection. If you choose to take medicine to prevent HIV, you should first get tested for HIV. You should then be tested every 3 months for as long as you are taking the medicine. Pregnancy  If you are about to stop having your period (premenopausal) and  you may become pregnant, seek counseling before you get pregnant.  Take 400 to 800 micrograms (mcg) of folic acid every day if you become pregnant.  Ask for birth control (contraception) if you want to prevent pregnancy. Osteoporosis and menopause Osteoporosis is a disease in which the bones lose minerals and strength with aging. This can result in bone fractures. If you are 76 years old or older, or if you are at risk for osteoporosis and fractures, ask your health care provider if you should:  Be screened for bone loss.  Take a calcium or vitamin D supplement to lower your risk of fractures.  Be given hormone replacement therapy (HRT) to treat symptoms of menopause. Follow these instructions at home: Lifestyle  Do not use any products that contain nicotine or tobacco, such as cigarettes, e-cigarettes, and chewing tobacco. If you need help quitting, ask your health care provider.  Do not use street drugs.  Do not share needles.  Ask your health care provider for help if you need support or information about quitting drugs. Alcohol use  Do not drink alcohol if: ? Your health care provider tells you not to drink. ? You are pregnant, may be pregnant, or are planning to become pregnant.  If you drink alcohol: ? Limit how much you use to 0-1 drink a day. ? Limit intake if you are breastfeeding.  Be aware of how much alcohol is in your drink. In the [place] one drink equals one 12 oz bottle of beer (355 mL), one 5 oz glass of wine (148 mL), or one 1 oz glass of hard liquor (44 mL). General instructions  Schedule regular health, dental, and eye exams.  Stay current with your vaccines.  Tell your health care provider if: ? You often feel depressed. ? You have ever been abused or do not feel safe at home. Summary  Adopting a healthy lifestyle and getting preventive care are important in promoting health and wellness.  Follow your health care provider's instructions about healthy  diet, exercising, and getting tested or screened for diseases.  Follow your health care provider's instructions on monitoring your cholesterol and blood pressure. This information is not intended to replace advice given to you by your health care provider. Make sure you discuss any questions you have with your health care provider. Document Released: 05/23/2011 Document Revised: 10/31/2018 Document Reviewed: 10/31/2018 Elsevier Patient Education  2020 [place] DASH stands for "Dietary Approaches to Stop Hypertension." The DASH eating plan is a healthy eating plan that has been shown to reduce high blood pressure (hypertension). It may also reduce your risk for type 2 diabetes, heart disease, and stroke. The DASH eating plan may also help with weight loss. What are tips for following this plan?  General guidelines  Avoid eating more than 2,300 mg (milligrams) of salt (sodium) a day. If you have hypertension, you may need to reduce your sodium intake to 1,500 mg a day.  Limit alcohol intake to no more than 1 drink a  day for nonpregnant women and 2 drinks a day for men. One drink equals 12 oz of beer, 5 oz of wine, or 1 oz of hard liquor.  Work with your health care provider to maintain a healthy body weight or to lose weight. Ask what an ideal weight is for you.  Get at least 30 minutes of exercise that causes your heart to beat faster (aerobic exercise) most days of the week. Activities may include walking, swimming, or biking.  Work with your health care provider or diet and nutrition specialist (dietitian) to adjust your eating plan to your individual calorie needs. Reading food labels   Check food labels for the amount of sodium per serving. Choose foods with less than 5 percent of the Daily Value of sodium. Generally, foods with less than 300 mg of sodium per serving fit into this eating plan.  To find whole grains, look for the word "whole" as the first word in  the ingredient list. Shopping  Buy products labeled as "low-sodium" or "no salt added."  Buy fresh foods. Avoid canned foods and premade or frozen meals. Cooking  Avoid adding salt when cooking. Use salt-free seasonings or herbs instead of table salt or sea salt. Check with your health care provider or pharmacist before using salt substitutes.  Do not fry foods. Cook foods using healthy methods such as baking, boiling, grilling, and broiling instead.  Cook with heart-healthy oils, such as olive, canola, soybean, or sunflower oil. Meal planning  Eat a balanced diet that includes: ? 5 or more servings of fruits and vegetables each day. At each meal, try to fill half of your plate with fruits and vegetables. ? Up to 6-8 servings of whole grains each day. ? Less than 6 oz of lean meat, poultry, or fish each day. A 3-oz serving of meat is about the same size as a deck of cards. One egg equals 1 oz. ? 2 servings of low-fat dairy each day. ? A serving of nuts, seeds, or beans 5 times each week. ? Heart-healthy fats. Healthy fats called Omega-3 fatty acids are found in foods such as flaxseeds and coldwater fish, like sardines, salmon, and mackerel.  Limit how much you eat of the following: ? Canned or prepackaged foods. ? Food that is high in trans fat, such as fried foods. ? Food that is high in saturated fat, such as fatty meat. ? Sweets, desserts, sugary drinks, and other foods with added sugar. ? Full-fat dairy products.  Do not salt foods before eating.  Try to eat at least 2 vegetarian meals each week.  Eat more home-cooked food and less restaurant, buffet, and fast food.  When eating at a restaurant, ask that your food be prepared with less salt or no salt, if possible. What foods are recommended? The items listed may not be a complete list. Talk with your dietitian about what dietary choices are best for you. Grains Whole-grain or whole-wheat bread. Whole-grain or whole-wheat  pasta. Brown rice. [place]. Bulgur. Whole-grain and low-sodium cereals. Pita bread. Low-fat, low-sodium crackers. Whole-wheat flour tortillas. Vegetables Fresh or frozen vegetables (raw, steamed, roasted, or grilled). Low-sodium or reduced-sodium tomato and vegetable juice. Low-sodium or reduced-sodium tomato sauce and tomato paste. Low-sodium or reduced-sodium canned vegetables. Fruits All fresh, dried, or frozen fruit. Canned fruit in natural juice (without added sugar). Meat and other protein foods Skinless chicken or [place]. Ground chicken or [place]. Pork with fat trimmed off. Fish and seafood. Egg whites. Dried beans, peas,  or lentils. Unsalted nuts, nut butters, and seeds. Unsalted canned beans. Lean cuts of beef with fat trimmed off. Low-sodium, lean deli meat. Dairy Low-fat (1%) or fat-free (skim) milk. Fat-free, low-fat, or reduced-fat cheeses. Nonfat, low-sodium ricotta or cottage cheese. Low-fat or nonfat yogurt. Low-fat, low-sodium cheese. Fats and oils Soft margarine without trans fats. Vegetable oil. Low-fat, reduced-fat, or light mayonnaise and salad dressings (reduced-sodium). Canola, safflower, olive, soybean, and sunflower oils. Avocado. Seasoning and other foods Herbs. Spices. Seasoning mixes without salt. Unsalted popcorn and pretzels. Fat-free sweets. What foods are not recommended? The items listed may not be a complete list. Talk with your dietitian about what dietary choices are best for you. Grains Baked goods made with fat, such as croissants, muffins, or some breads. Dry pasta or rice meal packs. Vegetables Creamed or fried vegetables. Vegetables in a cheese sauce. Regular canned vegetables (not low-sodium or reduced-sodium). Regular canned tomato sauce and paste (not low-sodium or reduced-sodium). Regular tomato and vegetable juice (not low-sodium or reduced-sodium). Angie Fava. Olives. Fruits Canned fruit in a light or heavy syrup. Fried fruit. Fruit in cream or  butter sauce. Meat and other protein foods Fatty cuts of meat. Ribs. Fried meat. Berniece Salines. Sausage. Bologna and other processed lunch meats. Salami. Fatback. Hotdogs. Bratwurst. Salted nuts and seeds. Canned beans with added salt. Canned or smoked fish. Whole eggs or egg yolks. Chicken or [place] with skin. Dairy Whole or 2% milk, cream, and half-and-half. Whole or full-fat cream cheese. Whole-fat or sweetened yogurt. Full-fat cheese. Nondairy creamers. Whipped toppings. Processed cheese and cheese spreads. Fats and oils Butter. Stick margarine. Lard. Shortening. Ghee. Bacon fat. Tropical oils, such as coconut, palm kernel, or palm oil. Seasoning and other foods Salted popcorn and pretzels. Onion salt, garlic salt, seasoned salt, table salt, and sea salt. Worcestershire sauce. Tartar sauce. Barbecue sauce. Teriyaki sauce. Soy sauce, including reduced-sodium. Steak sauce. Canned and packaged gravies. Fish sauce. Oyster sauce. Cocktail sauce. Horseradish that you find on the shelf. Ketchup. Mustard. Meat flavorings and tenderizers. Bouillon cubes. Hot sauce and Tabasco sauce. Premade or packaged marinades. Premade or packaged taco seasonings. Relishes. Regular salad dressings. Where to find more information:  National Heart, Lung, and [place]: https://wilson-eaton.com/  American Heart Association: www.heart.org Summary  The DASH eating plan is a healthy eating plan that has been shown to reduce high blood pressure (hypertension). It may also reduce your risk for type 2 diabetes, heart disease, and stroke.  With the DASH eating plan, you should limit salt (sodium) intake to 2,300 mg a day. If you have hypertension, you may need to reduce your sodium intake to 1,500 mg a day.  When on the DASH eating plan, aim to eat more fresh fruits and vegetables, whole grains, lean proteins, low-fat dairy, and heart-healthy fats.  Work with your health care provider or diet and nutrition specialist (dietitian) to  adjust your eating plan to your individual calorie needs. This information is not intended to replace advice given to you by your health care provider. Make sure you discuss any questions you have with your health care provider. Document Released: 10/27/2011 Document Revised: 10/20/2017 Document Reviewed: 10/31/2016 Elsevier Patient Education  2020 [employer].

## 2019-05-31 LAB — URINALYSIS, COMPLETE W/RFL CULTURE
Bilirubin Urine: NEGATIVE
Glucose, UA: NEGATIVE
Hgb urine dipstick: NEGATIVE
Ketones, ur: NEGATIVE
Leukocyte Esterase: NEGATIVE
Nitrites, Initial: NEGATIVE
Specific Gravity, Urine: 1.041 — ABNORMAL HIGH (ref 1.001–1.03)
pH: <=5 (ref 5.0–8.0)

## 2019-05-31 LAB — URINE CULTURE
MICRO NUMBER:: 649981
SPECIMEN QUALITY:: ADEQUATE

## 2019-05-31 LAB — CULTURE INDICATED

## 2019-08-03 ENCOUNTER — Emergency Department (HOSPITAL_COMMUNITY): Payer: Medicare Other

## 2019-08-03 ENCOUNTER — Emergency Department (HOSPITAL_COMMUNITY)
Admission: EM | Admit: 2019-08-03 | Discharge: 2019-08-04 | Disposition: A | Discharge status: Home / Self Care | Payer: Medicare Other | Attending: Emergency Medicine | Admitting: Emergency Medicine

## 2019-08-03 ENCOUNTER — Other Ambulatory Visit: Payer: Self-pay

## 2019-08-03 DIAGNOSIS — Y929 Unspecified place or not applicable: Secondary | ICD-10-CM | POA: Diagnosis not present

## 2019-08-03 DIAGNOSIS — Y999 Unspecified external cause status: Secondary | ICD-10-CM | POA: Diagnosis not present

## 2019-08-03 DIAGNOSIS — Z79899 Other long term (current) drug therapy: Secondary | ICD-10-CM | POA: Insufficient documentation

## 2019-08-03 DIAGNOSIS — I10 Essential (primary) hypertension: Secondary | ICD-10-CM | POA: Diagnosis not present

## 2019-08-03 DIAGNOSIS — S298XXA Other specified injuries of thorax, initial encounter: Secondary | ICD-10-CM | POA: Diagnosis present

## 2019-08-03 DIAGNOSIS — M3210 Systemic lupus erythematosus, organ or system involvement unspecified: Secondary | ICD-10-CM | POA: Diagnosis not present

## 2019-08-03 DIAGNOSIS — Z87891 Personal history of nicotine dependence: Secondary | ICD-10-CM | POA: Diagnosis not present

## 2019-08-03 DIAGNOSIS — I2729 Other secondary pulmonary hypertension: Secondary | ICD-10-CM | POA: Insufficient documentation

## 2019-08-03 DIAGNOSIS — Y939 Activity, unspecified: Secondary | ICD-10-CM | POA: Insufficient documentation

## 2019-08-03 DIAGNOSIS — S46912A Strain of unspecified muscle, fascia and tendon at shoulder and upper arm level, left arm, initial encounter: Secondary | ICD-10-CM | POA: Diagnosis not present

## 2019-08-03 DIAGNOSIS — X58XXXA Exposure to other specified factors, initial encounter: Secondary | ICD-10-CM | POA: Insufficient documentation

## 2019-08-03 DIAGNOSIS — J9610 Chronic respiratory failure, unspecified whether with hypoxia or hypercapnia: Secondary | ICD-10-CM | POA: Diagnosis not present

## 2019-08-03 LAB — CBC
HCT: 43.7 % (ref 36.0–46.0)
Hemoglobin: 13.8 g/dL (ref 12.0–15.0)
MCH: 26.2 pg (ref 26.0–34.0)
MCHC: 31.6 g/dL (ref 30.0–36.0)
MCV: 83.1 fL (ref 80.0–100.0)
Platelets: 161 10*3/uL (ref 150–400)
RBC: 5.26 MIL/uL — ABNORMAL HIGH (ref 3.87–5.11)
RDW: 13.2 % (ref 11.5–15.5)
WBC: 9 10*3/uL (ref 4.0–10.5)
nRBC: 0 % (ref 0.0–0.2)

## 2019-08-03 LAB — I-STAT BETA HCG BLOOD, ED (MC, WL, AP ONLY): I-stat hCG, quantitative: <5 m[IU]/mL (ref <5)

## 2019-08-03 LAB — BASIC METABOLIC PANEL
Anion gap: 11 (ref 5–15)
BUN: 12 mg/dL (ref 6–20)
CO2: 20 mmol/L — ABNORMAL LOW (ref 22–32)
Calcium: 9 mg/dL (ref 8.9–10.3)
Chloride: 104 mmol/L (ref 98–111)
Creatinine, Ser: 0.73 mg/dL (ref 0.44–1.00)
GFR calc Af Amer: >60 mL/min (ref >60)
GFR calc non Af Amer: >60 mL/min (ref >60)
Glucose, Bld: 121 mg/dL — ABNORMAL HIGH (ref 70–99)
Potassium: 4.1 mmol/L (ref 3.5–5.1)
Sodium: 135 mmol/L (ref 135–145)

## 2019-08-03 LAB — TROPONIN I (HIGH SENSITIVITY): Troponin I (High Sensitivity): <2 ng/L (ref <18)

## 2019-08-03 MED ORDER — SODIUM CHLORIDE 0.9% FLUSH: 3.0000 mL | Freq: Once | INTRAVENOUS | Status: DC

## 2019-08-03 NOTE — ED Triage Notes (Signed)
Pt reports having chest pain that radiates to her back L arm and bilateral sides. She reports the pain feels like a burning pain, feels like she is having indigestion. She reports being seen by her PCP and prescribed Levaquin with no relief.

## 2019-08-04 DIAGNOSIS — S46912A Strain of unspecified muscle, fascia and tendon at shoulder and upper arm level, left arm, initial encounter: Secondary | ICD-10-CM | POA: Diagnosis not present

## 2019-08-04 LAB — URINALYSIS, ROUTINE W REFLEX MICROSCOPIC
Bilirubin Urine: NEGATIVE
Glucose, UA: NEGATIVE mg/dL
Hgb urine dipstick: NEGATIVE
Ketones, ur: 5 mg/dL — AB
Leukocytes,Ua: NEGATIVE
Nitrite: NEGATIVE
Protein, ur: NEGATIVE mg/dL
Specific Gravity, Urine: 1.028 (ref 1.005–1.030)
pH: 5 (ref 5.0–8.0)

## 2019-08-04 LAB — TROPONIN I (HIGH SENSITIVITY): Troponin I (High Sensitivity): <2 ng/L (ref <18)

## 2019-08-04 MED ORDER — LIDOCAINE VISCOUS HCL 2 % MT SOLN
15.0000 mL | Freq: Once | OROMUCOSAL | Status: AC
  Administered 2019-08-04: 04:00:00 15 mL via ORAL
  Filled 2019-08-04: qty 15

## 2019-08-04 MED ORDER — KETOROLAC TROMETHAMINE 60 MG/2ML IM SOLN
30.0000 mg | Freq: Once | INTRAMUSCULAR | Status: AC
  Administered 2019-08-04: 03:00:00 30 mg via INTRAMUSCULAR
  Filled 2019-08-04: qty 2

## 2019-08-04 MED ORDER — ALUM & MAG HYDROXIDE-SIMETH 200-200-20 MG/5ML PO SUSP
30.0000 mL | Freq: Once | ORAL | Status: AC
  Administered 2019-08-04: 04:00:00 30 mL via ORAL
  Filled 2019-08-04: qty 30

## 2019-08-04 NOTE — ED Provider Notes (Signed)
[facility] EMERGENCY DEPARTMENT Provider Note  CSN: [id number] Arrival date & time: 08/03/19 2009  Chief Complaint(s) Chest Pain  HPI Kayla [name] is a 28 y.o. female currently being treated for possible pneumonia presents to the emergency department with 2 to 3 days of constant left upper chest/shoulder girdle pain that began gradual and worsened since onset.  Pain worse with movement.  Alleviated by mobility.  Denies any nausea or vomiting.  No shortness of breath.  No associated abdominal pain.  No fevers or chills.  Reports that while waiting in the emergency department she began to develop lower abdominal discomfort.  No urinary symptoms.  No diarrhea.  No vaginal bleeding or discharge.  Last menstrual cycle was 2 weeks ago.  HPI  Past Medical History Past Medical History:  Diagnosis Date  . Arthritis    "hands and legs" (01/08/2015)  . CAP (community acquired pneumonia) 01/07/2015  . Daily headache    "sometimes" (01/08/2015)  . GERD (gastroesophageal reflux disease)   . Hypertension   . Lung disease   . Lupus (HCC)   . Pulmonary hypertension (HCC)   . Sjogren's syndrome [facility])    Patient Active Problem List   Diagnosis Date Noted  . Acute respiratory failure with hypoxemia (HCC) 09/19/2017  . Acute respiratory failure with hypoxia (HCC)   . Multifocal pneumonia   . ILD (interstitial lung disease) (HCC)   . History of IUFD 02/14/2017  . 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 Chronic Respiratory failure with oxygen requirement affecting pregnancy, antepartum 05/16/2016  . Chronic hypertension 03/14/2016  . 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 Lupus (systemic lupus erythematosus) (HCC) 08/21/2015  . Loud P2 (pulmonary S2, second heart sound) 08/21/2015  . Insomnia 08/21/2015  . Systemic lupus erythematosus (SLE) affecting pregnancy, antepartum (HCC) 07/06/2015  . Sjogren's syndrome (HCC)  04/28/2015  . Interstitial lung disease (HCC) 02/06/2015   Home Medication(s) Prior to Admission medications   Medication Sig Start Date End Date Taking? Authorizing Provider  albuterol (PROVENTIL HFA;VENTOLIN HFA) 108 (90 Base) MCG/ACT inhaler Inhale 2 puffs into the lungs every 4 (four) hours as needed for wheezing or shortness of breath. 06/09/17   Adam Phenix, MD  amLODipine (NORVASC) 5 MG tablet Take 1 tablet (5 mg total) by mouth daily. 09/22/17   Duayne Cal, NP  azaTHIOprine (IMURAN) 50 MG tablet Take 3 tablets (150 mg total) by mouth daily. 09/15/17   Minott, Lynnae Prude, MD  fluticasone (FLONASE) 50 MCG/ACT nasal spray Place 2 sprays into both nostrils daily. 04/12/17   Rasch, Victorino Dike I, NP  gabapentin (NEURONTIN) 100 MG capsule Take 3 capsules by mouth at bedtime. 02/11/19   [provider]  hydroxychloroquine (PLAQUENIL) 200 MG tablet Take 400 mg by mouth daily.    [provider]  ibuprofen (ADVIL,MOTRIN) 600 MG tablet Take 1 tablet (600 mg total) by mouth every 6 (six) hours. Patient not taking: Reported on 05/29/2019 09/15/17   Minott, Lynnae Prude, MD  mycophenolate (CELLCEPT) 500 MG tablet Take 1,500 mg by mouth 2 (two) times daily. 06/15/18   [provider]  ondansetron (ZOFRAN) 4 MG tablet Take 1 tablet (4 mg total) by mouth every 8 (eight) hours as needed for nausea or vomiting. 04/24/19   Harrington Challenger, NP  predniSONE (DELTASONE) 5 MG tablet Take 7 mg by mouth daily with breakfast.     [provider]  sertraline (ZOLOFT) 25 MG tablet Take 25 mg  by mouth daily. 09/27/17 11/28/18  [provider]  terconazole (TERAZOL 3) 0.8 % vaginal cream Place 1 applicator vaginally at bedtime. Patient not taking: Reported on 05/29/2019 04/23/19   Harrington ChallengerYoung, Nancy J, NP                                                                                                                                    Past Surgical History Past Surgical History:  Procedure  Laterality Date  . CARDIAC CATHETERIZATION N/A 09/09/2015   Procedure: Right Heart Cath;  Surgeon: Laurey Moralealton S McLean, MD;  [facility];  Service: Cardiovascular;  Laterality: N/A;  . DILATION AND EVACUATION N/A 07/13/2015   Procedure: DILATATION AND EVACUATION;  Surgeon: Levie HeritageJacob J Stinson, DO;  Location: [facility];  Service: Gynecology;  Laterality: N/A;  . FINGER SURGERY Right 03/2014   "laceration, nerve/artery injury" 2nd digit  . VIDEO BRONCHOSCOPY Bilateral 01/12/2015   Procedure: VIDEO BRONCHOSCOPY WITH FLUORO;  Surgeon: Leslye Peerobert S Byrum, MD;  [facility];  Service: Cardiopulmonary;  Laterality: Bilateral;   Family History Family History  Problem Relation Age of Onset  . Diabetes Mother   . Diabetes Maternal Aunt   . Diabetes Maternal Grandmother     Social History Social History   Tobacco Use  . Smoking status: Former Smoker    Packs/day: 0.10    Years: 5.00    Pack years: 0.50    Types: Cigarettes    Quit date: 11/20/2014    Years since quitting: 4.7  . Smokeless tobacco: Never Used  Substance Use Topics  . Alcohol use: Yes    Alcohol/week: 0.0 standard drinks  . Drug use: No   Allergies Hydrocodone and Zithromax [azithromycin]  Review of Systems Review of Systems All other systems are reviewed and are negative for acute change except as noted in the HPI  Physical Exam Vital Signs  I have reviewed the triage vital signs BP 108/72 (BP Location: Right Arm)   Pulse 70   Temp 98.9 F (37.2 C) (Oral)   Resp 16   LMP 07/27/2019 (Exact Date)   SpO2 100%   Physical Exam Vitals signs reviewed.  Constitutional:      General: She is not in acute distress.    Appearance: She is well-developed. She is not diaphoretic.  HENT:     Head: Normocephalic and atraumatic.     Nose: Nose normal.  Eyes:     General: No scleral icterus.       Right eye: No discharge.        Left eye: No discharge.     Conjunctiva/sclera: Conjunctivae normal.      Pupils: Pupils are equal, round, and reactive to light.  Neck:     Musculoskeletal: Normal range of motion and neck supple.  Cardiovascular:     Rate and Rhythm: Normal rate and regular rhythm.     Heart sounds: No murmur. No  friction rub. No gallop.   Pulmonary:     Effort: Pulmonary effort is normal. No respiratory distress.     Breath sounds: Normal breath sounds. No stridor. No rales.  Chest:     Chest wall: Tenderness present.  Abdominal:     General: There is no distension.     Palpations: Abdomen is soft.     Tenderness: There is no abdominal tenderness.  Musculoskeletal:     Cervical back: She exhibits tenderness, pain and spasm. She exhibits no bony tenderness.       Back:     Comments: Pain reproduced with palpation of the left shoulder girdle muscles, worsening her chest pain.  Skin:    General: Skin is warm and dry.     Findings: No erythema or rash.  Neurological:     Mental Status: She is alert and oriented to person, place, and time.     ED Results and Treatments Labs (all labs ordered are listed, but only abnormal results are displayed) Labs Reviewed  BASIC METABOLIC PANEL - Abnormal; Notable for the following components:      Result Value   CO2 20 (*)    Glucose, Bld 121 (*)    All other components within normal limits  CBC - Abnormal; Notable for the following components:   RBC 5.26 (*)    All other components within normal limits  URINALYSIS, ROUTINE W REFLEX MICROSCOPIC - Abnormal; Notable for the following components:   Ketones, ur 5 (*)    All other components within normal limits  I-STAT BETA HCG BLOOD, ED ([facility], [facility], [facility] ONLY)  TROPONIN I (HIGH SENSITIVITY)  TROPONIN I (HIGH SENSITIVITY)                                                                                                                         EKG  EKG Interpretation  Date/Time:  Saturday August 03 2019 20:16:27 EDT Ventricular Rate:  81 PR Interval:  148 QRS Duration: 84 QT  Interval:  368 QTC Calculation: 427 R Axis:   48 Text Interpretation:  Normal sinus rhythm Normal ECG resolved TW changes from prior Confirmed by Drema Pry [phone]) on 08/04/2019 2:21:52 AM      Radiology Dg Chest 2 View  Result Date: 08/03/2019 CLINICAL DATA:  Patient with indigestion. Chest pain. EXAM: CHEST - 2 VIEW COMPARISON:  Chest radiograph 06/30/2018 FINDINGS: Normal cardiac and mediastinal contours. Similar-appearing bilateral mid lung scarring. No superimposed area of pulmonary consolidation. No pleural effusion or pneumothorax. Regional skeleton unremarkable. IMPRESSION: No acute cardiopulmonary process. Electronically Signed   By: Annia Belt M.D.   On: 08/03/2019 21:52    Pertinent labs & imaging results that were available during my care of the patient were reviewed by me and considered in my medical decision making (see chart for details).  Medications Ordered in ED Medications  sodium chloride flush (NS) 0.9 % injection 3 mL (has no administration in time range)  ketorolac (TORADOL) injection 30  mg (30 mg Intramuscular Given 08/04/19 0314)  alum & mag hydroxide-simeth (MAALOX/MYLANTA) 200-200-20 MG/5ML suspension 30 mL (30 mLs Oral Given 08/04/19 0336)    And  lidocaine (XYLOCAINE) 2 % viscous mouth solution 15 mL (15 mLs Oral Given 08/04/19 0336)                                                                                                                                    Procedures Procedures  (including critical care time)  Medical Decision Making / ED Course I have reviewed the nursing notes for this encounter and the patient's prior records (if available in EHR or on provided paperwork).   Taneil Lazarus was evaluated in Emergency Department on 08/04/2019 for the symptoms described in the history of present illness. She was evaluated in the context of the global COVID-19 pandemic, which necessitated consideration that the patient might be at risk for infection  with the SARS-CoV-2 virus that causes COVID-19. Institutional protocols and algorithms that pertain to the evaluation of patients at risk for COVID-19 are in a state of rapid change based on information released by regulatory bodies including the CDC and federal and state organizations. These policies and algorithms were followed during the patient's care in the ED.  Left upper chest and shoulder girdle pain.  Completely reproduced with palpation.  Suspicious for muscle strain/spasm of the left shoulder girdle muscle.  Atypical for ACS.  EKG without acute ischemic changes or evidence of pericarditis.  Troponin more than 24 hours since pain onset negative.  No need to repeat.   Low suspicion for pulmonary embolism.  Presentation not classic for aortic dissection or esophageal perforation.  Chest x-ray without evidence suggestive of pneumonia, pneumothorax, pneumomediastinum.  No abnormal contour of the mediastinum to suggest dissection. No evidence of acute injuries.  Abdomen benign.  UA without evidence of infection.  Labs reassuring without leukocytosis.  Low suspicion for serious intra-abdominal Fama to assess infectious process.  The patient appears reasonably screened and/or stabilized for discharge and I doubt any other medical condition or other [facility] requiring further screening, evaluation, or treatment in the ED at this time prior to discharge.  The patient is safe for discharge with strict return precautions.     Final Clinical Impression(s) / ED Diagnoses Final diagnoses:  Muscle strain of left shoulder region, initial encounter    The patient appears reasonably screened and/or stabilized for discharge and I doubt any other medical condition or other [facility] requiring further screening, evaluation, or treatment in the ED at this time prior to discharge.  Disposition: Discharge  Condition: Good  I have discussed the results, Dx and Tx plan with the patient who expressed understanding  and agree(s) with the plan. Discharge instructions discussed at great length. The patient was given strict return precautions who verbalized understanding of the instructions. No further questions at time of discharge.    ED Discharge Orders  None       Follow Up: Primary care provider  Schedule an appointment as soon as possible for a visit  If symptoms do not improve or  worsen      This chart was dictated using voice recognition software.  Despite best efforts to proofread,  errors can occur which can change the documentation meaning.   Nira Connardama, Mussa Groesbeck Eduardo, MD 08/04/19 [phone]

## 2019-08-04 NOTE — Discharge Instructions (Addendum)
You may use over-the-counter Motrin (Ibuprofen), Acetaminophen (Tylenol), topical muscle creams such as SalonPas, Icy Hot, Bengay, etc. Please stretch, apply heat, and have massage therapy for additional assistance. ° °

## 2019-08-12 ENCOUNTER — Encounter: Payer: Self-pay | Admitting: Gynecology

## 2019-08-29 ENCOUNTER — Encounter: Payer: Self-pay | Admitting: Women's Health

## 2019-08-30 ENCOUNTER — Other Ambulatory Visit: Payer: Self-pay | Admitting: Women's Health

## 2019-08-30 MED ORDER — FLUCONAZOLE 150 MG PO TABS: 150.0000 mg | ORAL_TABLET | Freq: Once | ORAL | 0 refills | Status: AC

## 2020-01-17 ENCOUNTER — Emergency Department (HOSPITAL_COMMUNITY): Payer: No Typology Code available for payment source

## 2020-01-17 ENCOUNTER — Other Ambulatory Visit: Payer: Self-pay

## 2020-01-17 ENCOUNTER — Emergency Department (HOSPITAL_COMMUNITY)
Admission: EM | Admit: 2020-01-17 | Discharge: 2020-01-17 | Disposition: A | Discharge status: Home / Self Care | Payer: No Typology Code available for payment source | Attending: Emergency Medicine | Admitting: Emergency Medicine

## 2020-01-17 DIAGNOSIS — Z87891 Personal history of nicotine dependence: Secondary | ICD-10-CM | POA: Insufficient documentation

## 2020-01-17 DIAGNOSIS — R110 Nausea: Secondary | ICD-10-CM | POA: Insufficient documentation

## 2020-01-17 DIAGNOSIS — S0990XA Unspecified injury of head, initial encounter: Secondary | ICD-10-CM | POA: Diagnosis not present

## 2020-01-17 DIAGNOSIS — M542 Cervicalgia: Secondary | ICD-10-CM | POA: Diagnosis not present

## 2020-01-17 DIAGNOSIS — I10 Essential (primary) hypertension: Secondary | ICD-10-CM | POA: Insufficient documentation

## 2020-01-17 DIAGNOSIS — Y9389 Activity, other specified: Secondary | ICD-10-CM | POA: Insufficient documentation

## 2020-01-17 DIAGNOSIS — Y998 Other external cause status: Secondary | ICD-10-CM | POA: Insufficient documentation

## 2020-01-17 DIAGNOSIS — Z79899 Other long term (current) drug therapy: Secondary | ICD-10-CM | POA: Diagnosis not present

## 2020-01-17 DIAGNOSIS — Y92410 Unspecified street and highway as the place of occurrence of the external cause: Secondary | ICD-10-CM | POA: Insufficient documentation

## 2020-01-17 MED ORDER — ONDANSETRON 4 MG PO TBDP
4.0000 mg | ORAL_TABLET | Freq: Once | ORAL | Status: AC
  Administered 2020-01-17: 20:00:00 4 mg via ORAL
  Filled 2020-01-17: qty 1

## 2020-01-17 MED ORDER — ACETAMINOPHEN 325 MG PO TABS
650.0000 mg | ORAL_TABLET | Freq: Once | ORAL | Status: AC
  Administered 2020-01-17: 19:00:00 650 mg via ORAL
  Filled 2020-01-17: qty 2

## 2020-01-17 MED ORDER — METHOCARBAMOL 500 MG PO TABS: 500.0000 mg | ORAL_TABLET | Freq: Two times a day (BID) | ORAL | 0 refills | Status: DC
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
## 2020-01-17 NOTE — Discharge Instructions (Addendum)
Take Tylenol or Motrin as needed for pain.  Take Robaxin as needed for muscle spasms.  Follow-up with your primary care provider in 2 days for continued evaluation.  Return to the emergency room immediately for new or worsening symptoms or concerns, such as vomiting, passing out, numbness, weakness or any concerns at all.

## 2020-01-27 ENCOUNTER — Ambulatory Visit (INDEPENDENT_AMBULATORY_CARE_PROVIDER_SITE_OTHER): Payer: Medicare Other | Admitting: Obstetrics & Gynecology

## 2020-01-27 ENCOUNTER — Other Ambulatory Visit: Payer: Self-pay

## 2020-01-27 ENCOUNTER — Encounter: Payer: Self-pay | Admitting: Obstetrics & Gynecology

## 2020-01-27 VITALS — BP 124/80

## 2020-01-27 DIAGNOSIS — N898 Other specified noninflammatory disorders of vagina: Secondary | ICD-10-CM

## 2020-01-27 DIAGNOSIS — R350 Frequency of micturition: Secondary | ICD-10-CM

## 2020-01-27 LAB — WET PREP FOR TRICH, YEAST, CLUE

## 2020-01-27 MED ORDER — TINIDAZOLE 500 MG PO TABS: 1.0000 g | ORAL_TABLET | Freq: Two times a day (BID) | ORAL | 0 refills | Status: AC

## 2020-01-27 MED ORDER — FLUCONAZOLE 150 MG PO TABS: 150.0000 mg | ORAL_TABLET | Freq: Every day | ORAL | 1 refills | Status: AC

## 2020-01-27 MED ORDER — CIPROFLOXACIN HCL 500 MG PO TABS: 500.0000 mg | ORAL_TABLET | Freq: Two times a day (BID) | ORAL | 0 refills | Status: AC

## 2020-01-27 NOTE — Progress Notes (Signed)
    Kayla [name] [birth year] [id number]        29 y.o.  [id number] single  RP: Urinary frequency and vaginal itching  HPI: Urinary frequency with mild discomfort.  Vaginal discharge with some itching.  No pelvic pain.  No abnormal bleeding.  Abstinent.  Systemic lupus erythematosus.   OB History  Gravida Para Term Preterm AB Living  3 2 1 1 1 1   SAB TAB Ectopic Multiple Live Births  1 0 0 0 1    # Outcome Date GA Lbr Len/2nd Weight Sex Delivery Anes PTL Lv  3 Term 09/13/17 [redacted]w[redacted]d 179:01 / 00:41 5 lb 13.7 oz (2.655 kg) F Vag-Spont EPI  LIV  2 Preterm 06/2015 [redacted]w[redacted]d   F    FD  1 SAB  [redacted]w[redacted]d    SAB       Past medical history,surgical history, problem list, medications, allergies, family history and social history were all reviewed and documented in the EPIC chart.   Directed ROS with pertinent positives and negatives documented in the history of present illness/assessment and plan.  Exam:  Vitals:   01/27/20 1527  BP: 124/80   General appearance:  Normal  CVAT negative bilaterally  Abdomen: Normal  Gynecologic exam: Vulva normal.  Speculum:  Cervix/Vagina normal.  Increased d/c.  Wet prep done.    U/A: Yellow cloudy, proteins 1+, nitrites negative, white blood cells 6-10, red blood cells negative, many bacteria, moderate clue cells.  Urine culture pending. Wet prep:  Clue cells present   Assessment/Plan:  29 y.o. 03/28/20   1. Frequent urination Probable acute cystitis per symptoms and urine analysis.  Decision to treat with ciprofloxacin 1 tablet twice a day for 7 days.  Usage reviewed and prescription sent to pharmacy.  Recommend drinking plenty of water. - Urinalysis,Complete w/RFL Culture  2. Vaginal discharge Bacterial vaginosis confirmed by wet prep and clue cells also present in urine.  Decision to treat with tinidazole.  Usage reviewed and prescription sent to pharmacy.  Will take fluconazole after the antibiotics to prevent or treat a yeast infection.  Precautions  reviewed. - WET PREP FOR TRICH, YEAST, CLUE  Other orders - Urine Culture - REFLEXIVE URINE CULTURE - ciprofloxacin (CIPRO) 500 MG tablet; Take 1 tablet (500 mg total) by mouth 2 (two) times daily for 7 days. - tinidazole (TINDAMAX) 500 MG tablet; Take 2 tablets (1,000 mg total) by mouth 2 (two) times daily for 2 days. - fluconazole (DIFLUCAN) 150 MG tablet; Take 1 tablet (150 mg total) by mouth daily for 3 days.  [id number] MD, 3:48 PM 01/27/2020

## 2020-01-29 LAB — URINE CULTURE
MICRO NUMBER:: 10225485
SPECIMEN QUALITY:: ADEQUATE

## 2020-01-29 LAB — URINALYSIS, COMPLETE W/RFL CULTURE
Bilirubin Urine: NEGATIVE
Glucose, UA: NEGATIVE
Hgb urine dipstick: NEGATIVE
Hyaline Cast: NONE SEEN /LPF
Nitrites, Initial: NEGATIVE
RBC / HPF: NONE SEEN /HPF (ref 0–2)
Specific Gravity, Urine: 1.027 (ref 1.001–1.03)
pH: 6 (ref 5.0–8.0)

## 2020-01-29 LAB — CULTURE INDICATED

## 2020-02-01 ENCOUNTER — Encounter: Payer: Self-pay | Admitting: Obstetrics & Gynecology

## 2020-02-01 NOTE — Patient Instructions (Signed)
1. Frequent urination Probable acute cystitis per symptoms and urine analysis.  Decision to treat with ciprofloxacin 1 tablet twice a day for 7 days.  Usage reviewed and prescription sent to pharmacy.  Recommend drinking plenty of water. - Urinalysis,Complete w/RFL Culture  2. Vaginal discharge Bacterial vaginosis confirmed by wet prep and clue cells also present in urine.  Decision to treat with tinidazole.  Usage reviewed and prescription sent to pharmacy.  Will take fluconazole after the antibiotics to prevent or treat a yeast infection.  Precautions reviewed. - WET PREP FOR TRICH, YEAST, CLUE  Other orders - Urine Culture - REFLEXIVE URINE CULTURE - ciprofloxacin (CIPRO) 500 MG tablet; Take 1 tablet (500 mg total) by mouth 2 (two) times daily for 7 days. - tinidazole (TINDAMAX) 500 MG tablet; Take 2 tablets (1,000 mg total) by mouth 2 (two) times daily for 2 days. - fluconazole (DIFLUCAN) 150 MG tablet; Take 1 tablet (150 mg total) by mouth daily for 3 days.  Kayla [name], it was a pleasure seeing you today!  I will inform you of your results as soon as they are available.

## 2020-05-01 ENCOUNTER — Encounter: Payer: Self-pay | Admitting: Obstetrics & Gynecology

## 2020-05-01 ENCOUNTER — Other Ambulatory Visit: Payer: Self-pay

## 2020-05-01 ENCOUNTER — Ambulatory Visit (INDEPENDENT_AMBULATORY_CARE_PROVIDER_SITE_OTHER): Payer: Medicare Other | Admitting: Obstetrics & Gynecology

## 2020-05-01 DIAGNOSIS — N898 Other specified noninflammatory disorders of vagina: Secondary | ICD-10-CM

## 2020-05-01 DIAGNOSIS — Z3009 Encounter for other general counseling and advice on contraception: Secondary | ICD-10-CM

## 2020-05-01 DIAGNOSIS — R102 Pelvic and perineal pain: Secondary | ICD-10-CM

## 2020-05-01 DIAGNOSIS — Z113 Encounter for screening for infections with a predominantly sexual mode of transmission: Secondary | ICD-10-CM

## 2020-05-01 DIAGNOSIS — R350 Frequency of micturition: Secondary | ICD-10-CM

## 2020-05-01 LAB — URINALYSIS, COMPLETE W/RFL CULTURE
Bacteria, UA: NONE SEEN /HPF
Bilirubin Urine: NEGATIVE
Glucose, UA: NEGATIVE
Hgb urine dipstick: NEGATIVE
Hyaline Cast: NONE SEEN /LPF
Ketones, ur: NEGATIVE
Leukocyte Esterase: NEGATIVE
Nitrites, Initial: NEGATIVE
Protein, ur: NEGATIVE
RBC / HPF: NONE SEEN /HPF (ref 0–2)
Specific Gravity, Urine: 1.024 (ref 1.001–1.03)
WBC, UA: NONE SEEN /HPF (ref 0–5)
pH: 6 (ref 5.0–8.0)

## 2020-05-01 LAB — NO CULTURE INDICATED

## 2020-05-01 LAB — WET PREP FOR TRICH, YEAST, CLUE

## 2020-05-01 NOTE — Progress Notes (Signed)
    Kayla [name] 11/22/1990 [id number]        29 y.o.  [id number]   RP: Lower abdominal pain  HPI: Lower abdominal pain with vaginal discharge.  Urinary frequency.  LMP mid 03/2020.  Had 3 days of light bleeding 6/1-3rd.  Last IC 1 month ago, no condom used.  Not on any contraception.   OB History  Gravida Para Term Preterm AB Living  3 2 1 1 1 1   SAB TAB Ectopic Multiple Live Births  1 0 0 0 1    # Outcome Date GA Lbr Len/2nd Weight Sex Delivery Anes PTL Lv  3 Term 09/13/17 [redacted]w[redacted]d 179:01 / 00:41 5 lb 13.7 oz (2.655 kg) F Vag-Spont EPI  LIV  2 Preterm 06/2015 [redacted]w[redacted]d   F    FD  1 SAB  [redacted]w[redacted]d    SAB       Past medical history,surgical history, problem list, medications, allergies, family history and social history were all reviewed and documented in the EPIC chart.   Directed ROS with pertinent positives and negatives documented in the history of present illness/assessment and plan.  Exam:  There were no vitals filed for this visit. General appearance:  Normal CVAT Negative bilaterally Abdomen: Normal  Gynecologic exam: Vulva normal.  Speculum:  Cervix/Vagina normal.  Wet prep done.  Bimanual exam:  Uterus AV, mobile, NT.  No adnexal mass, NT.  Wet prep Negative U/A completely normal   Assessment/Plan:  29 y.o. [redacted]w[redacted]d   1. Female pelvic pain Lower abdominal discomfort and pain.  Normal gynecologic exam otherwise.  Rule out STD.  Rule out pregnancy.  Follow-up with a pelvic ultrasound to complete the investigation. - [id number] Transvaginal Non-OB; Future - hCG, serum, qualitative  2. Encounter for other general counseling or advice on contraception Declines contraception.  Strict condom use strongly recommended.  3. Vaginal discharge Wet prep negative, patient reassured. - WET PREP FOR TRICH, YEAST, CLUE  4. Screen for STD (sexually transmitted disease) Strict condom use strongly recommended. - Gonorrhea and Chlamydia done - HIV antibody (with reflex) - RPR - Hepatitis C  Antibody - Hepatitis B Surface AntiGEN  5. Frequent urination Urine analysis completely negative.  Patient reassured. - Urinalysis,Complete w/RFL Culture  [place] MD, 11:42 AM 05/01/2020

## 2020-05-02 ENCOUNTER — Encounter: Payer: Self-pay | Admitting: Obstetrics & Gynecology

## 2020-05-02 LAB — C. TRACHOMATIS/N. GONORRHOEAE RNA
C. trachomatis RNA, TMA: NOT DETECTED
N. gonorrhoeae RNA, TMA: NOT DETECTED

## 2020-05-02 LAB — HCG, SERUM, QUALITATIVE: Preg, Serum: NEGATIVE

## 2020-05-02 NOTE — Patient Instructions (Signed)
1. Female pelvic pain Lower abdominal discomfort and pain.  Normal gynecologic exam otherwise.  Rule out STD.  Rule out pregnancy.  Follow-up with a pelvic ultrasound to complete the investigation. - US Transvaginal Non-OB; Future - hCG, serum, qualitative  2. Encounter for other general counseling or advice on contraception Declines contraception.  Strict condom use strongly recommended.  3. Vaginal discharge Wet prep negative, patient reassured. - WET PREP FOR TRICH, YEAST, CLUE  4. Screen for STD (sexually transmitted disease) Strict condom use strongly recommended. - Gonorrhea and Chlamydia done - HIV antibody (with reflex) - RPR - Hepatitis C Antibody - Hepatitis B Surface AntiGEN  5. Frequent urination Urine analysis completely negative.  Patient reassured. - Urinalysis,Complete w/RFL Culture  Keshawn, it was a pleasure seeing you today!  I will inform you of all your results as soon as they are available.

## 2020-05-04 LAB — HIV ANTIBODY (ROUTINE TESTING W REFLEX): HIV 1&2 Ab, 4th Generation: NONREACTIVE

## 2020-05-04 LAB — HEPATITIS C ANTIBODY
Hepatitis C Ab: NONREACTIVE
SIGNAL TO CUT-OFF: 0.01 (ref <1.00)

## 2020-05-04 LAB — RPR: RPR Ser Ql: NONREACTIVE

## 2020-05-04 LAB — HEPATITIS B SURFACE ANTIGEN: Hepatitis B Surface Ag: NONREACTIVE

## 2020-05-05 ENCOUNTER — Other Ambulatory Visit: Payer: Medicare Other

## 2020-05-05 ENCOUNTER — Ambulatory Visit: Payer: Medicaid Other | Admitting: Obstetrics & Gynecology

## 2020-05-08 ENCOUNTER — Encounter (HOSPITAL_COMMUNITY): Payer: Self-pay

## 2020-05-08 ENCOUNTER — Emergency Department (HOSPITAL_COMMUNITY)
Admission: EM | Admit: 2020-05-08 | Discharge: 2020-05-09 | Disposition: A | Discharge status: Home / Self Care | Payer: Medicare Other | Attending: Emergency Medicine | Admitting: Emergency Medicine

## 2020-05-08 ENCOUNTER — Other Ambulatory Visit: Payer: Self-pay

## 2020-05-08 DIAGNOSIS — R519 Headache, unspecified: Secondary | ICD-10-CM | POA: Insufficient documentation

## 2020-05-08 DIAGNOSIS — H9201 Otalgia, right ear: Secondary | ICD-10-CM | POA: Diagnosis not present

## 2020-05-08 DIAGNOSIS — Z5321 Procedure and treatment not carried out due to patient leaving prior to being seen by health care provider: Secondary | ICD-10-CM | POA: Insufficient documentation

## 2020-05-08 DIAGNOSIS — R5383 Other fatigue: Secondary | ICD-10-CM | POA: Insufficient documentation

## 2020-05-08 DIAGNOSIS — M791 Myalgia, unspecified site: Secondary | ICD-10-CM | POA: Insufficient documentation

## 2020-05-08 DIAGNOSIS — R220 Localized swelling, mass and lump, head: Secondary | ICD-10-CM | POA: Insufficient documentation

## 2020-05-08 LAB — CBC WITH DIFFERENTIAL/PLATELET
Abs Immature Granulocytes: 0.04 10*3/uL (ref 0.00–0.07)
Basophils Absolute: 0 10*3/uL (ref 0.0–0.1)
Basophils Relative: 0 %
Eosinophils Absolute: 0 10*3/uL (ref 0.0–0.5)
Eosinophils Relative: 0 %
HCT: 43 % (ref 36.0–46.0)
Hemoglobin: 13.5 g/dL (ref 12.0–15.0)
Immature Granulocytes: 0 %
Lymphocytes Relative: 38 %
Lymphs Abs: 3.4 10*3/uL (ref 0.7–4.0)
MCH: 26.9 pg (ref 26.0–34.0)
MCHC: 31.4 g/dL (ref 30.0–36.0)
MCV: 85.7 fL (ref 80.0–100.0)
Monocytes Absolute: 0.7 10*3/uL (ref 0.1–1.0)
Monocytes Relative: 7 %
Neutro Abs: 4.8 10*3/uL (ref 1.7–7.7)
Neutrophils Relative %: 55 %
Platelets: 228 10*3/uL (ref 150–400)
RBC: 5.02 MIL/uL (ref 3.87–5.11)
RDW: 12.6 % (ref 11.5–15.5)
WBC: 9 10*3/uL (ref 4.0–10.5)
nRBC: 0 % (ref 0.0–0.2)

## 2020-05-08 LAB — BASIC METABOLIC PANEL
Anion gap: 12 (ref 5–15)
BUN: 9 mg/dL (ref 6–20)
CO2: 19 mmol/L — ABNORMAL LOW (ref 22–32)
Calcium: 8.6 mg/dL — ABNORMAL LOW (ref 8.9–10.3)
Chloride: 105 mmol/L (ref 98–111)
Creatinine, Ser: 0.84 mg/dL (ref 0.44–1.00)
GFR calc Af Amer: >60 mL/min (ref >60)
GFR calc non Af Amer: >60 mL/min (ref >60)
Glucose, Bld: 127 mg/dL — ABNORMAL HIGH (ref 70–99)
Potassium: 3 mmol/L — ABNORMAL LOW (ref 3.5–5.1)
Sodium: 136 mmol/L (ref 135–145)

## 2020-05-08 NOTE — ED Triage Notes (Addendum)
Pt arrives POV for eval of nodule behind R ear noted on Sunday. Pt also reports HA and ear pain since that time as well. Denies fever/chills. Pt also reports concern for lupus flare up, states she does have generalized myalgias and fatigue.

## 2020-05-09 NOTE — ED Notes (Signed)
Called Pt for vitals no answer. 

## 2020-05-09 NOTE — ED Notes (Signed)
Pt called for vitals no answer. °

## 2020-05-26 ENCOUNTER — Encounter (HOSPITAL_COMMUNITY): Payer: Self-pay | Admitting: Emergency Medicine

## 2020-05-26 ENCOUNTER — Other Ambulatory Visit: Payer: Self-pay

## 2020-05-26 ENCOUNTER — Emergency Department (HOSPITAL_COMMUNITY)
Admission: EM | Admit: 2020-05-26 | Discharge: 2020-05-26 | Disposition: A | Discharge status: Home / Self Care | Payer: No Typology Code available for payment source | Attending: Emergency Medicine | Admitting: Emergency Medicine

## 2020-05-26 ENCOUNTER — Emergency Department (HOSPITAL_COMMUNITY): Payer: No Typology Code available for payment source

## 2020-05-26 DIAGNOSIS — I10 Essential (primary) hypertension: Secondary | ICD-10-CM | POA: Diagnosis not present

## 2020-05-26 DIAGNOSIS — R519 Headache, unspecified: Secondary | ICD-10-CM | POA: Insufficient documentation

## 2020-05-26 DIAGNOSIS — M7918 Myalgia, other site: Secondary | ICD-10-CM | POA: Insufficient documentation

## 2020-05-26 DIAGNOSIS — Z87891 Personal history of nicotine dependence: Secondary | ICD-10-CM | POA: Insufficient documentation

## 2020-05-26 DIAGNOSIS — J3489 Other specified disorders of nose and nasal sinuses: Secondary | ICD-10-CM

## 2020-05-26 DIAGNOSIS — T148XXA Other injury of unspecified body region, initial encounter: Secondary | ICD-10-CM

## 2020-05-26 DIAGNOSIS — Z79899 Other long term (current) drug therapy: Secondary | ICD-10-CM | POA: Diagnosis not present

## 2020-05-26 DIAGNOSIS — M3210 Systemic lupus erythematosus, organ or system involvement unspecified: Secondary | ICD-10-CM | POA: Insufficient documentation

## 2020-05-26 MED ORDER — CYCLOBENZAPRINE HCL 10 MG PO TABS
10.0000 mg | ORAL_TABLET | Freq: Two times a day (BID) | ORAL | 0 refills | Status: AC | PRN
Start: 2020-05-26 — End: 2020-06-02

## 2020-05-26 MED ORDER — CYCLOBENZAPRINE HCL 10 MG PO TABS
10.0000 mg | ORAL_TABLET | Freq: Once | ORAL | Status: AC
  Administered 2020-05-26: 10 mg via ORAL
  Filled 2020-05-26: qty 1

## 2020-05-26 MED ORDER — NAPROXEN 500 MG PO TABS
500.0000 mg | ORAL_TABLET | Freq: Two times a day (BID) | ORAL | 0 refills | Status: DC
Start: 2020-05-26 — End: 2020-11-10

## 2020-05-26 MED ORDER — METHYLPREDNISOLONE SODIUM SUCC 125 MG IJ SOLR
80.0000 mg | Freq: Once | INTRAMUSCULAR | Status: AC
  Administered 2020-05-26: 80 mg via INTRAMUSCULAR
  Filled 2020-05-26: qty 2
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
## 2020-05-26 NOTE — Discharge Instructions (Addendum)
Your xrays were reassuring. You will be sore for a few days. Thank you for allowing me to care for you today. Please return to the emergency department if you have new or worsening symptoms. Take your medications as instructed.

## 2020-05-26 NOTE — ED Notes (Signed)
PT IN XRAY

## 2020-05-26 NOTE — ED Notes (Signed)
Pt. Given hot packs and blanket in waiting room.

## 2020-05-29 ENCOUNTER — Encounter (HOSPITAL_COMMUNITY): Payer: Self-pay | Admitting: Emergency Medicine

## 2020-05-29 ENCOUNTER — Other Ambulatory Visit: Payer: Self-pay

## 2020-05-29 ENCOUNTER — Emergency Department (HOSPITAL_COMMUNITY)
Admission: EM | Admit: 2020-05-29 | Discharge: 2020-05-30 | Disposition: A | Discharge status: Home / Self Care | Payer: Medicare Other | Attending: Emergency Medicine | Admitting: Emergency Medicine

## 2020-05-29 DIAGNOSIS — Z5321 Procedure and treatment not carried out due to patient leaving prior to being seen by health care provider: Secondary | ICD-10-CM | POA: Diagnosis not present

## 2020-05-29 DIAGNOSIS — M7918 Myalgia, other site: Secondary | ICD-10-CM | POA: Diagnosis not present

## 2020-05-29 DIAGNOSIS — J029 Acute pharyngitis, unspecified: Secondary | ICD-10-CM | POA: Diagnosis not present

## 2020-05-29 DIAGNOSIS — R05 Cough: Secondary | ICD-10-CM | POA: Diagnosis present

## 2020-05-29 NOTE — ED Triage Notes (Signed)
Patient reports productive cough with nasal congestion . Body aches and sore throat onset this week , no fever or chills .

## 2020-05-30 NOTE — ED Notes (Signed)
Pt is no longer here to be seen. Went home once her daughter got discharged from peds.

## 2020-06-18 ENCOUNTER — Ambulatory Visit (INDEPENDENT_AMBULATORY_CARE_PROVIDER_SITE_OTHER): Payer: Medicare Other | Admitting: Obstetrics and Gynecology

## 2020-06-18 ENCOUNTER — Encounter: Payer: Self-pay | Admitting: Obstetrics and Gynecology

## 2020-06-18 ENCOUNTER — Other Ambulatory Visit: Payer: Self-pay

## 2020-06-18 VITALS — BP 124/78

## 2020-06-18 DIAGNOSIS — N926 Irregular menstruation, unspecified: Secondary | ICD-10-CM

## 2020-06-18 DIAGNOSIS — N898 Other specified noninflammatory disorders of vagina: Secondary | ICD-10-CM

## 2020-06-18 LAB — WET PREP FOR TRICH, YEAST, CLUE

## 2020-06-18 LAB — PREGNANCY, URINE: Preg Test, Ur: NEGATIVE

## 2020-06-18 MED ORDER — METRONIDAZOLE 500 MG PO TABS: 500.0000 mg | ORAL_TABLET | Freq: Two times a day (BID) | ORAL | 0 refills | Status: DC

## 2020-06-18 NOTE — Progress Notes (Signed)
Kayla [name] 08-Feb-1991 [id number]  SUBJECTIVE:  29 y.o. [id number] female presents for evaluation of lower abdominal pain with vaginal odor and vaginal discomfort.  She is not really complaining of any discharge.  She is sexually active with the same partner.  No STI concern at this time.  Not using contraception or condoms.   Current Outpatient Medications  Medication Sig Dispense Refill  . albuterol (PROVENTIL HFA;VENTOLIN HFA) 108 (90 Base) MCG/ACT inhaler Inhale 2 puffs into the lungs every 4 (four) hours as needed for wheezing or shortness of breath. 1 Inhaler 5  . fluticasone (FLONASE) 50 MCG/ACT nasal spray Place 2 sprays into both nostrils daily. 16 g 2  . hydroxychloroquine (PLAQUENIL) 200 MG tablet Take 400 mg by mouth daily.    Marland Kitchen ibuprofen (ADVIL,MOTRIN) 600 MG tablet Take 1 tablet (600 mg total) by mouth every 6 (six) hours. 30 tablet 0  . mycophenolate (CELLCEPT) 500 MG tablet Take 1,500 mg by mouth 2 (two) times daily.    . ondansetron (ZOFRAN) 4 MG tablet Take 1 tablet (4 mg total) by mouth every 8 (eight) hours as needed for nausea or vomiting. 20 tablet 0  . predniSONE (DELTASONE) 5 MG tablet Take 7 mg by mouth daily with breakfast.     . amLODipine (NORVASC) 5 MG tablet Take 1 tablet (5 mg total) by mouth daily. (Patient not taking: Reported on 06/18/2020)    . azaTHIOprine (IMURAN) 50 MG tablet Take 3 tablets (150 mg total) by mouth daily. (Patient not taking: Reported on 06/18/2020) 90 tablet 0  . gabapentin (NEURONTIN) 100 MG capsule Take 3 capsules by mouth at bedtime. (Patient not taking: Reported on 06/18/2020)    . methocarbamol (ROBAXIN) 500 MG tablet Take 1 tablet (500 mg total) by mouth 2 (two) times daily. (Patient not taking: Reported on 06/18/2020) 20 tablet 0  . metroNIDAZOLE (FLAGYL) 500 MG tablet Take 1 tablet (500 mg total) by mouth 2 (two) times daily. 14 tablet 0  . naproxen (NAPROSYN) 500 MG tablet Take 1 tablet (500 mg total) by mouth 2 (two) times daily.  (Patient not taking: Reported on 06/18/2020) 30 tablet 0  . sertraline (ZOLOFT) 25 MG tablet Take 25 mg by mouth daily.     No current facility-administered medications for this visit.   Allergies: Hydrocodone and Zithromax [azithromycin]  Patient's last menstrual period was 06/11/2020.  Past medical history,surgical history, problem list, medications, allergies, family history and social history were all reviewed and documented as reviewed in the EPIC chart.  ROS:  Feeling well. No dyspnea or chest pain on exertion.  No abdominal pain, change in bowel habits, black or bloody stools.  No urinary tract symptoms. GYN ROS: Normal menses, otherwise as described in HPI  OBJECTIVE:  BP 124/78   LMP 06/11/2020  The patient appears well, alert, oriented x 3, in no distress. PELVIC EXAM: VULVA: normal appearing vulva with no masses, tenderness or lesions, VAGINA: normal appearing vagina with normal color but small amount of grayish-yellow discharge, no appreciable odor, purulence, no lesions, CERVIX: normal appearing cervix without discharge or lesions, WET MOUNT done - results: +hyphae, no hyphae or trichomonads, few WBC, many bacteria, 7-12 epithelial cells/hpf Urine pregnancy test negative  Chaperone: Fuller Song (DNP student) present during the examination and performed the pelvic exam with me in attendance to confirm the exam findings  ASSESSMENT:  29 y.o. [id number] with bacterial vaginosis  PLAN:  Metronidazole 500 mg twice daily x7 days.  Potential side effects discussed in addition to  avoidance of alcohol consumption while taking the medication and for least 2 days afterwards.  Will follow-up if any ongoing symptoms.   Theresia Majors MD 06/18/20

## 2020-07-13 DIAGNOSIS — K219 Gastro-esophageal reflux disease without esophagitis: Secondary | ICD-10-CM

## 2020-07-13 HISTORY — DX: Gastro-esophageal reflux disease without esophagitis: K21.9

## 2020-08-13 MED ORDER — FLUCONAZOLE 150 MG PO TABS
150.0000 mg | ORAL_TABLET | Freq: Once | ORAL | 0 refills | Status: AC
Start: 2020-08-13 — End: 2020-08-13

## 2020-08-13 NOTE — Telephone Encounter (Signed)
Dr.Lavoie patient c/o yeast pill.

## 2020-08-18 ENCOUNTER — Other Ambulatory Visit: Payer: Self-pay

## 2020-08-18 ENCOUNTER — Ambulatory Visit (INDEPENDENT_AMBULATORY_CARE_PROVIDER_SITE_OTHER): Payer: Medicare Other | Admitting: Nurse Practitioner

## 2020-08-18 ENCOUNTER — Other Ambulatory Visit: Payer: Self-pay | Admitting: Nurse Practitioner

## 2020-08-18 ENCOUNTER — Encounter: Payer: Self-pay | Admitting: Nurse Practitioner

## 2020-08-18 VITALS — BP 120/74

## 2020-08-18 DIAGNOSIS — N760 Acute vaginitis: Secondary | ICD-10-CM | POA: Diagnosis not present

## 2020-08-18 DIAGNOSIS — B9689 Other specified bacterial agents as the cause of diseases classified elsewhere: Secondary | ICD-10-CM

## 2020-08-18 DIAGNOSIS — Z113 Encounter for screening for infections with a predominantly sexual mode of transmission: Secondary | ICD-10-CM

## 2020-08-18 DIAGNOSIS — R110 Nausea: Secondary | ICD-10-CM

## 2020-08-18 DIAGNOSIS — N898 Other specified noninflammatory disorders of vagina: Secondary | ICD-10-CM | POA: Diagnosis not present

## 2020-08-18 LAB — WET PREP FOR TRICH, YEAST, CLUE

## 2020-08-18 MED ORDER — METRONIDAZOLE 500 MG PO TABS: 500.0000 mg | ORAL_TABLET | Freq: Two times a day (BID) | ORAL | 0 refills | Status: DC

## 2020-08-18 MED ORDER — ONDANSETRON 4 MG PO TBDP: 4.0000 mg | ORAL_TABLET | Freq: Three times a day (TID) | ORAL | 0 refills | Status: DC | PRN

## 2020-08-18 NOTE — Progress Notes (Signed)
° °  Acute Office Visit  Subjective:    [name], female    DOB: [birth year], 29 y.o.   MRN: [id number]   HPI 29 y.o. presents today for vaginal discharge and odor that started 1 week ago. Denies urinary symptoms. No new sexual partners but would like STD testing today, Gonorrhea/Chlyamdia only as she does not want blood work.    Review of Systems  Constitutional: Negative.   Genitourinary: Positive for vaginal discharge. Negative for dysuria, frequency and urgency.       Vaginal odor       Objective:    Physical Exam Constitutional:      Appearance: Normal appearance.  Genitourinary:    General: Normal vulva.     Vagina: Normal.     Cervix: Normal.     BP 120/74    LMP 08/04/2020  Wt Readings from Last 3 Encounters:  05/29/20 187 lb 6.3 oz (85 kg)  05/26/20 178 lb (80.7 kg)  05/08/20 170 lb (77.1 kg)   Wet prep + clue cells     Assessment & Plan:   Problem List Items Addressed This Visit    None    Visit Diagnoses    Bacterial vaginosis    -  Primary   Relevant Medications   metroNIDAZOLE (FLAGYL) 500 MG tablet   Vaginal discharge       Relevant Orders   WET PREP FOR TRICH, YEAST, CLUE   Screen for STD (sexually transmitted disease)       Relevant Orders   C. trachomatis/N. gonorrhoeae RNA     Plan: Wet prep positive for clue cells. Flagyl 500 mg twice a day for 7 days. Instructed to complete full course of antibiotic and avoid alcohol. Gonorrhea/Chlamyida today. She is agreeable to plan.      [name] [facility], 12:51 PM 08/18/2020

## 2020-08-18 NOTE — Patient Instructions (Signed)
Bacterial Vaginosis  Bacterial vaginosis is a vaginal infection that occurs when the normal balance of bacteria in the vagina is disrupted. It results from an overgrowth of certain bacteria. This is the most common vaginal infection among women ages 15-44. Because bacterial vaginosis increases your risk for STIs (sexually transmitted infections), getting treated can help reduce your risk for chlamydia, gonorrhea, herpes, and HIV (human immunodeficiency virus). Treatment is also important for preventing complications in pregnant women, because this condition can cause an early (premature) delivery. What are the causes? This condition is caused by an increase in harmful bacteria that are normally present in small amounts in the vagina. However, the reason that the condition develops is not fully understood. What increases the risk? The following factors may make you more likely to develop this condition:  Having a new sexual partner or multiple sexual partners.  Having unprotected sex.  Douching.  Having an intrauterine device (IUD).  Smoking.  Drug and alcohol abuse.  Taking certain antibiotic medicines.  Being pregnant. You cannot get bacterial vaginosis from toilet seats, bedding, swimming pools, or contact with objects around you. What are the signs or symptoms? Symptoms of this condition include:  Grey or white vaginal discharge. The discharge can also be watery or foamy.  A fish-like odor with discharge, especially after sexual intercourse or during menstruation.  Itching in and around the vagina.  Burning or pain with urination. Some women with bacterial vaginosis have no signs or symptoms. How is this diagnosed? This condition is diagnosed based on:  Your medical history.  A physical exam of the vagina.  Testing a sample of vaginal fluid under a microscope to look for a large amount of bad bacteria or abnormal cells. Your health care provider may use a cotton swab or  a small wooden spatula to collect the sample. How is this treated? This condition is treated with antibiotics. These may be given as a pill, a vaginal cream, or a medicine that is put into the vagina (suppository). If the condition comes back after treatment, a second round of antibiotics may be needed. Follow these instructions at home: Medicines  Take over-the-counter and prescription medicines only as told by your health care provider.  Take or use your antibiotic as told by your health care provider. Do not stop taking or using the antibiotic even if you start to feel better. General instructions  If you have a female sexual partner, tell her that you have a vaginal infection. She should see her health care provider and be treated if she has symptoms. If you have a female sexual partner, he does not need treatment.  During treatment: ? Avoid sexual activity until you finish treatment. ? Do not douche. ? Avoid alcohol as directed by your health care provider. ? Avoid breastfeeding as directed by your health care provider.  Drink enough water and fluids to keep your urine clear or pale yellow.  Keep the area around your vagina and rectum clean. ? Wash the area daily with warm water. ? Wipe yourself from front to back after using the toilet.  Keep all follow-up visits as told by your health care provider. This is important. How is this prevented?  Do not douche.  Wash the outside of your vagina with warm water only.  Use protection when having sex. This includes latex condoms and dental dams.  Limit how many sexual partners you have. To help prevent bacterial vaginosis, it is best to have sex with just one partner (  monogamous).  Make sure you and your sexual partner are tested for STIs.  Wear cotton or cotton-lined underwear.  Avoid wearing tight pants and pantyhose, especially during summer.  Limit the amount of alcohol that you drink.  Do not use any products that contain  nicotine or tobacco, such as cigarettes and e-cigarettes. If you need help quitting, ask your health care provider.  Do not use illegal drugs. Where to find more information  Centers for Disease Control and Prevention: www.cdc.gov/std  American Sexual Health Association (ASHA): www.ashastd.org  U.S. Department of Health and Human Services, Office on Women's Health: www.womenshealth.gov/ or https://www.womenshealth.gov/a-z-topics/bacterial-vaginosis Contact a health care provider if:  Your symptoms do not improve, even after treatment.  You have more discharge or pain when urinating.  You have a fever.  You have pain in your abdomen.  You have pain during sex.  You have vaginal bleeding between periods. Summary  Bacterial vaginosis is a vaginal infection that occurs when the normal balance of bacteria in the vagina is disrupted.  Because bacterial vaginosis increases your risk for STIs (sexually transmitted infections), getting treated can help reduce your risk for chlamydia, gonorrhea, herpes, and HIV (human immunodeficiency virus). Treatment is also important for preventing complications in pregnant women, because the condition can cause an early (premature) delivery.  This condition is treated with antibiotic medicines. These may be given as a pill, a vaginal cream, or a medicine that is put into the vagina (suppository). This information is not intended to replace advice given to you by your health care provider. Make sure you discuss any questions you have with your health care provider. Document Revised: 10/20/2017 Document Reviewed: 07/23/2016 Elsevier Patient Education  2020 Elsevier Inc.  

## 2020-08-19 LAB — C. TRACHOMATIS/N. GONORRHOEAE RNA
C. trachomatis RNA, TMA: NOT DETECTED
N. gonorrhoeae RNA, TMA: NOT DETECTED

## 2020-09-22 ENCOUNTER — Ambulatory Visit (INDEPENDENT_AMBULATORY_CARE_PROVIDER_SITE_OTHER): Payer: Medicare Other | Admitting: Nurse Practitioner

## 2020-09-22 ENCOUNTER — Encounter: Payer: Self-pay | Admitting: Nurse Practitioner

## 2020-09-22 ENCOUNTER — Other Ambulatory Visit: Payer: Self-pay

## 2020-09-22 VITALS — BP 124/80

## 2020-09-22 DIAGNOSIS — R350 Frequency of micturition: Secondary | ICD-10-CM

## 2020-09-22 DIAGNOSIS — N898 Other specified noninflammatory disorders of vagina: Secondary | ICD-10-CM | POA: Diagnosis not present

## 2020-09-22 DIAGNOSIS — R1030 Lower abdominal pain, unspecified: Secondary | ICD-10-CM

## 2020-09-22 DIAGNOSIS — B373 Candidiasis of vulva and vagina: Secondary | ICD-10-CM

## 2020-09-22 DIAGNOSIS — B3731 Acute candidiasis of vulva and vagina: Secondary | ICD-10-CM

## 2020-09-22 LAB — WET PREP FOR TRICH, YEAST, CLUE

## 2020-09-22 MED ORDER — FLUCONAZOLE 150 MG PO TABS: 150.0000 mg | ORAL_TABLET | ORAL | 0 refills | Status: DC

## 2020-09-22 NOTE — Patient Instructions (Signed)
Vaginal Yeast Infection, Adult  Vaginal yeast infection is a condition that causes vaginal discharge as well as soreness, swelling, and redness (inflammation) of the vagina. This is a common condition. Some women get this infection frequently. What are the causes? This condition is caused by a change in the normal balance of the yeast (candida) and bacteria that live in the vagina. This change causes an overgrowth of yeast, which causes the inflammation. What increases the risk? The condition is more likely to develop in women who:  Take antibiotic medicines.  Have diabetes.  Take birth control pills.  Are pregnant.  Douche often.  Have a weak body defense system (immune system).  Have been taking steroid medicines for a long time.  Frequently wear tight clothing. What are the signs or symptoms? Symptoms of this condition include:  White, thick, creamy vaginal discharge.  Swelling, itching, redness, and irritation of the vagina. The lips of the vagina (vulva) may be affected as well.  Pain or a burning feeling while urinating.  Pain during sex. How is this diagnosed? This condition is diagnosed based on:  Your medical history.  A physical exam.  A pelvic exam. Your health care provider will examine a sample of your vaginal discharge under a microscope. Your health care provider may send this sample for testing to confirm the diagnosis. How is this treated? This condition is treated with medicine. Medicines may be over-the-counter or prescription. You may be told to use one or more of the following:  Medicine that is taken by mouth (orally).  Medicine that is applied as a cream (topically).  Medicine that is inserted directly into the vagina (suppository). Follow these instructions at home:  Lifestyle  Do not have sex until your health care provider approves. Tell your sex partner that you have a yeast infection. That person should go to his or her health care  provider and ask if they should also be treated.  Do not wear tight clothes, such as pantyhose or tight pants.  Wear breathable cotton underwear. General instructions  Take or apply over-the-counter and prescription medicines only as told by your health care provider.  Eat more yogurt. This may help to keep your yeast infection from returning.  Do not use tampons until your health care provider approves.  Try taking a sitz bath to help with discomfort. This is a warm water bath that is taken while you are sitting down. The water should only come up to your hips and should cover your buttocks. Do this 3-4 times per day or as told by your health care provider.  Do not douche.  If you have diabetes, keep your blood sugar levels under control.  Keep all follow-up visits as told by your health care provider. This is important. Contact a health care provider if:  You have a fever.  Your symptoms go away and then return.  Your symptoms do not get better with treatment.  Your symptoms get worse.  You have new symptoms.  You develop blisters in or around your vagina.  You have blood coming from your vagina and it is not your menstrual period.  You develop pain in your abdomen. Summary  Vaginal yeast infection is a condition that causes discharge as well as soreness, swelling, and redness (inflammation) of the vagina.  This condition is treated with medicine. Medicines may be over-the-counter or prescription.  Take or apply over-the-counter and prescription medicines only as told by your health care provider.  Do not douche.   Do not have sex or use tampons until your health care provider approves.  Contact a health care provider if your symptoms do not get better with treatment or your symptoms go away and then return. This information is not intended to replace advice given to you by your health care provider. Make sure you discuss any questions you have with your health care  provider. Document Revised: 06/07/2019 Document Reviewed: 03/26/2018 Elsevier Patient Education  2020 Elsevier Inc.  

## 2020-09-22 NOTE — Progress Notes (Signed)
   Acute Office Visit  Subjective:    [name], female    DOB: 1990/12/10, 29 y.o.   MRN: [id number]   HPI 29 y.o. presents today for urinary frequency, urgency, odor and lower abdominal pain that started 2 days ago. She has also noticed some vaginal discharge. Denies vaginal itching or odor. She does complain of some constipation and feelings of having to have a bowel movement but then unable to go.    Review of Systems  Constitutional: Negative.   Gastrointestinal: Positive for abdominal pain.  Genitourinary: Positive for frequency, urgency and vaginal discharge. Negative for dysuria and hematuria.       Objective:    Physical Exam Constitutional:      Appearance: Normal appearance.  Abdominal:     Tenderness: There is no abdominal tenderness. There is no right CVA tenderness or left CVA tenderness.  Genitourinary:    General: Normal vulva.     Vagina: Normal.     Cervix: Normal.     BP 124/80 (BP Location: Right Arm, Patient Position: Sitting, Cuff Size: Normal)   LMP 09/06/2020  Wt Readings from Last 3 Encounters:  05/29/20 187 lb 6.3 oz (85 kg)  05/26/20 178 lb (80.7 kg)  05/08/20 170 lb (77.1 kg)   Wet prep + yeast UA leukocyte negative, nitrite negative, wbc 0-5, moderate bacteria     Assessment & Plan:   Problem List Items Addressed This Visit    None    Visit Diagnoses    Vaginal discharge    -  Primary   Relevant Orders   WET PREP FOR TRICH, YEAST, CLUE   Urinary frequency       Relevant Orders   Urinalysis,Complete w/RFL Culture   Lower abdominal pain       Vaginal candidiasis       Relevant Medications   fluconazole (DIFLUCAN) 150 MG tablet     Plan: Wet prep positive for yeast.  Diflucan 150 mg today and repeat in 3 days for total of 2 doses.  Reassurance provided on UA.  Culture pending.  Recommend stool softeners and/or Miralax for regulation of bowel movements.  If symptoms worsen or do not improve she will return to  office.  She is agreeable to plan.     [name] [facility], 12:03 PM 09/22/2020

## 2020-09-24 LAB — URINE CULTURE
MICRO NUMBER:: 11148743
SPECIMEN QUALITY:: ADEQUATE

## 2020-09-24 LAB — URINALYSIS, COMPLETE W/RFL CULTURE
Bilirubin Urine: NEGATIVE
Glucose, UA: NEGATIVE
Hgb urine dipstick: NEGATIVE
Hyaline Cast: NONE SEEN /LPF
Ketones, ur: NEGATIVE
Leukocyte Esterase: NEGATIVE
Nitrites, Initial: NEGATIVE
Protein, ur: NEGATIVE
RBC / HPF: NONE SEEN /HPF (ref 0–2)
Specific Gravity, Urine: 1.025 (ref 1.001–1.03)
pH: 7 (ref 5.0–8.0)

## 2020-09-24 LAB — CULTURE INDICATED

## 2020-10-12 ENCOUNTER — Ambulatory Visit (INDEPENDENT_AMBULATORY_CARE_PROVIDER_SITE_OTHER): Payer: Medicare Other | Admitting: Nurse Practitioner

## 2020-10-12 ENCOUNTER — Other Ambulatory Visit: Payer: Self-pay

## 2020-10-12 ENCOUNTER — Encounter: Payer: Self-pay | Admitting: Nurse Practitioner

## 2020-10-12 VITALS — BP 124/78

## 2020-10-12 DIAGNOSIS — N898 Other specified noninflammatory disorders of vagina: Secondary | ICD-10-CM | POA: Diagnosis not present

## 2020-10-12 DIAGNOSIS — R350 Frequency of micturition: Secondary | ICD-10-CM

## 2020-10-12 LAB — WET PREP FOR TRICH, YEAST, CLUE

## 2020-10-12 NOTE — Progress Notes (Signed)
   Acute Office Visit  Subjective:    [name], female    DOB: [birth year], 29 y.o.   MRN: [id number]   HPI 29 y.o. presents today for an increase in white vaginal discharge, vaginal itching, and urinary frequency that started 5 days ago. No new sexual partners. History of BV and yeast infections.    Review of Systems  Constitutional: Negative.   Genitourinary: Positive for frequency and vaginal discharge. Negative for dysuria, hematuria and urgency.       Vaginal itching       Objective:    Physical Exam Constitutional:      Appearance: Normal appearance.  Genitourinary:    General: Normal vulva.     Vagina: Normal.     Cervix: Normal.     BP 124/78   LMP 09/28/2020  Wt Readings from Last 3 Encounters:  05/29/20 187 lb 6.3 oz (85 kg)  05/26/20 178 lb (80.7 kg)  05/08/20 170 lb (77.1 kg)   UA negative Wet prep negative     Assessment & Plan:   Problem List Items Addressed This Visit    None    Visit Diagnoses    Vaginal discharge    -  Primary   Relevant Orders   WET PREP FOR TRICH, YEAST, CLUE   Frequent urination       Relevant Orders   Urinalysis,Complete w/RFL Culture      Plan: Reassurance provided on normal wet prep, urinalysis, and exam. If symptoms worsen or do not improve she will return to clinic. She is agreeable to plan.     [name] [facility], 9:56 AM 10/12/2020

## 2020-10-13 LAB — URINE CULTURE
MICRO NUMBER:: 11232847
Result:: NO GROWTH
SPECIMEN QUALITY:: ADEQUATE

## 2020-10-13 LAB — URINALYSIS, COMPLETE W/RFL CULTURE
Bilirubin Urine: NEGATIVE
Glucose, UA: NEGATIVE
Hgb urine dipstick: NEGATIVE
Hyaline Cast: NONE SEEN /LPF
Ketones, ur: NEGATIVE
Leukocyte Esterase: NEGATIVE
Nitrites, Initial: NEGATIVE
Protein, ur: NEGATIVE
RBC / HPF: NONE SEEN /HPF (ref 0–2)
Specific Gravity, Urine: 1.027 (ref 1.001–1.03)
pH: 6 (ref 5.0–8.0)

## 2020-10-13 LAB — CULTURE INDICATED

## 2020-11-09 ENCOUNTER — Other Ambulatory Visit: Payer: Self-pay

## 2020-11-09 ENCOUNTER — Encounter (HOSPITAL_COMMUNITY): Payer: Self-pay | Admitting: Family Medicine

## 2020-11-09 ENCOUNTER — Ambulatory Visit (HOSPITAL_COMMUNITY)
Admission: EM | Admit: 2020-11-09 | Discharge: 2020-11-09 | Disposition: A | Discharge status: Home / Self Care | Payer: Medicare Other | Attending: Family Medicine | Admitting: Family Medicine

## 2020-11-09 DIAGNOSIS — M329 Systemic lupus erythematosus, unspecified: Secondary | ICD-10-CM

## 2020-11-09 DIAGNOSIS — Z3A01 Less than 8 weeks gestation of pregnancy: Secondary | ICD-10-CM | POA: Diagnosis not present

## 2020-11-09 LAB — POC URINE PREG, ED: Preg Test, Ur: POSITIVE — AB

## 2020-11-09 LAB — POCT URINALYSIS DIPSTICK, ED / UC
Bilirubin Urine: NEGATIVE
Glucose, UA: NEGATIVE mg/dL
Hgb urine dipstick: NEGATIVE
Ketones, ur: NEGATIVE mg/dL
Leukocytes,Ua: NEGATIVE
Nitrite: NEGATIVE
Protein, ur: NEGATIVE mg/dL
Specific Gravity, Urine: >=1.03 (ref 1.005–1.030)
Urobilinogen, UA: 0.2 mg/dL (ref 0.0–1.0)
pH: 5 (ref 5.0–8.0)

## 2020-11-09 MED ORDER — PRENATAL VITAMIN 27-0.8 MG PO TABS: 1.0000 | ORAL_TABLET | Freq: Every morning | ORAL | 3 refills | Status: DC

## 2020-11-09 NOTE — ED Triage Notes (Signed)
Patient reports this is the third day of headache and abdominal pain.  Denies burning , but does have frequent urination.  Patient had a normal BM earlier today.  Denies vaginal discharge.

## 2020-11-09 NOTE — Discharge Instructions (Addendum)
You need to call your doctors at [facility] to inform them of your pregnancy and ask them about continuing your lupus medication.  Start prenatal vitamins  Set up an appointment with an obstetrician.

## 2020-11-09 NOTE — ED Provider Notes (Signed)
[facility]    CSN: [id number] Arrival date & time: 11/09/20  1458      History   Chief Complaint Chief Complaint  Patient presents with  . Headache  . Abdominal Pain    HPI Kayla [name] is a 29 y.o. female.   This is patient's first visit to [facility] urgent care in over 3 years.  Patient reports this is the third day of headache and abdominal pain.  Denies burning , but does have frequent urination.  Patient had a normal BM earlier today.  Denies vaginal discharge.  Patient is sexually active and her last menstrual period was in the latter half of last month.  She is not sure if she could be pregnant now.  She has no discharge and no vaginal bleeding.  Patient has lupus and she is suspicious that this may be a lupus exacerbation.  She says that she has had a frontal and facial rash over the last couple days that accompanies these new symptoms.  Patient is G3, P1.  Patient has slight nausea but no breast tenderness nor vomiting.  She has had no fever and no diarrhea.  She did have a bowel movement today but it did not make any difference in her abdominal pain.  The headache is located on the right side of her frontal area.     Past Medical History:  Diagnosis Date  . Arthritis    "hands and legs" (01/08/2015)  . CAP (community acquired pneumonia) 01/07/2015  . Daily headache    "sometimes" (01/08/2015)  . GERD (gastroesophageal reflux disease)   . Hypertension   . Lung disease   . Lupus (HCC)   . Pulmonary hypertension (HCC)   . Sjogren's syndrome [facility])     Patient Active Problem List   Diagnosis Date Noted  . Acute respiratory failure with hypoxemia (HCC) 09/19/2017  . Acute respiratory failure with hypoxia (HCC)   . Multifocal pneumonia   . ILD (interstitial lung disease) (HCC)   . History of IUFD 02/14/2017  . 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 Chronic Respiratory failure with oxygen requirement  affecting pregnancy, antepartum 05/16/2016  . Chronic hypertension 03/14/2016  . 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 Lupus (systemic lupus erythematosus) (HCC) 08/21/2015  . Loud P2 (pulmonary S2, second heart sound) 08/21/2015  . Insomnia 08/21/2015  . Systemic lupus erythematosus (SLE) affecting pregnancy, antepartum (HCC) 07/06/2015  . Sjogren's syndrome (HCC) 04/28/2015  . Interstitial lung disease (HCC) 02/06/2015    Past Surgical History:  Procedure Laterality Date  . CARDIAC CATHETERIZATION N/A 09/09/2015   Procedure: Right Heart Cath;  Surgeon: Laurey Morale, MD;  Location: [facility] INVASIVE CV LAB;  Service: Cardiovascular;  Laterality: N/A;  . DILATION AND EVACUATION N/A 07/13/2015   Procedure: DILATATION AND EVACUATION;  Surgeon: Levie Heritage, DO;  Location: [facility] ORS;  Service: Gynecology;  Laterality: N/A;  . FINGER SURGERY Right 03/2014   "laceration, nerve/artery injury" 2nd digit  . VIDEO BRONCHOSCOPY Bilateral 01/12/2015   Procedure: VIDEO BRONCHOSCOPY WITH FLUORO;  Surgeon: Leslye Peer, MD;  Location: [facility] ENDOSCOPY;  Service: Cardiopulmonary;  Laterality: Bilateral;    OB History    Gravida  3   Para  2   Term  1   Preterm  1   AB  1   Living  1     SAB  1   IAB  0   Ectopic  0   Multiple  0   Live Births  1            Home Medications    Prior to Admission medications   Medication Sig Start Date End Date Taking? Authorizing Provider  gabapentin (NEURONTIN) 100 MG capsule Take 3 capsules by mouth at bedtime.  02/11/19  Yes [provider]  hydroxychloroquine (PLAQUENIL) 200 MG tablet Take 400 mg by mouth daily.   Yes [provider]  mycophenolate (CELLCEPT) 500 MG tablet Take 1,500 mg by mouth 2 (two) times daily. 06/15/18  Yes [provider]  predniSONE (DELTASONE) 5 MG tablet Take 7 mg by mouth daily with breakfast.    Yes [provider]  sertraline (ZOLOFT) 50 MG tablet Take by  mouth. 01/27/20 01/26/21 Yes [provider]  albuterol (PROVENTIL HFA;VENTOLIN HFA) 108 (90 Base) MCG/ACT inhaler Inhale 2 puffs into the lungs every 4 (four) hours as needed for wheezing or shortness of breath. 06/09/17   Adam Phenix, MD  fluticasone [facility]) 50 MCG/ACT nasal spray Place 2 sprays into both nostrils daily. 04/12/17   Rasch, Victorino Dike I, NP  ibuprofen (ADVIL,MOTRIN) 600 MG tablet Take 1 tablet (600 mg total) by mouth every 6 (six) hours. 09/15/17   Minott, Lynnae Prude, MD  naproxen (NAPROSYN) 500 MG tablet Take 1 tablet (500 mg total) by mouth 2 (two) times daily. 05/26/20   Ronnie Doss A, PA-C  ondansetron (ZOFRAN ODT) 4 MG disintegrating tablet Take 1 tablet (4 mg total) by mouth every 8 (eight) hours as needed for nausea or vomiting. 08/18/20   [name], NP  Prenatal Vit-Fe Fumarate-FA (PRENATAL VITAMIN) 27-0.8 MG TABS Take 1 tablet by mouth every morning. 11/09/20   Elvina Sidle, MD  sertraline (ZOLOFT) 25 MG tablet Take 25 mg by mouth daily. 09/27/17 09/22/20  [provider]  amLODipine (NORVASC) 5 MG tablet Take 1 tablet (5 mg total) by mouth daily. 09/22/17 11/09/20  Duayne Cal, NP    Family History Family History  Problem Relation Age of Onset  . Diabetes Mother   . Diabetes Maternal Aunt   . Diabetes Maternal Grandmother     Social History Social History   Tobacco Use  . Smoking status: Former Smoker    Packs/day: 0.10    Years: 5.00    Pack years: 0.50    Types: Cigarettes    Quit date: 11/20/2014    Years since quitting: 5.9  . Smokeless tobacco: Never Used  Vaping Use  . Vaping Use: Never used  Substance Use Topics  . Alcohol use: Not Currently    Alcohol/week: 0.0 standard drinks  . Drug use: No     Allergies   Hydrocodone and Zithromax [azithromycin]   Review of Systems Review of Systems  Constitutional: Negative.   Gastrointestinal: Positive for abdominal pain.  Skin: Positive for rash.  Neurological:  Positive for headaches.     Physical Exam Triage Vital Signs ED Triage Vitals [11/09/20 1638]  Enc Vitals Group     BP      Pulse      Resp      Temp      Temp src      SpO2      Weight      Height      Head Circumference      Peak Flow      Pain Score 8     Pain Loc      Pain Edu?      Excl.  in GC?    No data found.  Updated Vital Signs BP 123/72 (BP Location: Left Arm)   Pulse 89   Temp 98.1 F (36.7 C) (Oral)   Resp 18   LMP 10/11/2020   SpO2 100%    Physical Exam Vitals and nursing note reviewed.  Constitutional:      General: She is not in acute distress.    Appearance: She is well-developed. She is obese.  HENT:     Head: Normocephalic.     Mouth/Throat:     Mouth: Mucous membranes are moist.  Eyes:     Extraocular Movements: Extraocular movements intact.  Cardiovascular:     Rate and Rhythm: Normal rate.  Pulmonary:     Effort: Pulmonary effort is normal.  Abdominal:     General: Bowel sounds are normal. There is no distension.     Palpations: Abdomen is soft. There is no mass.     Tenderness: There is no abdominal tenderness. There is no guarding.  Musculoskeletal:        General: Normal range of motion.     Cervical back: Normal range of motion and neck supple.  Skin:    General: Skin is warm.     Findings: Rash present.     Comments: Hyperpigmented rash over forehead  Neurological:     Mental Status: She is alert.     Cranial Nerves: No cranial nerve deficit.     Sensory: No sensory deficit.  Psychiatric:        Mood and Affect: Mood is depressed.        Speech: Speech normal.        Behavior: Behavior normal.      UC Treatments / Results  Labs (all labs ordered are listed, but only abnormal results are displayed) Labs Reviewed  POC URINE PREG, ED - Abnormal; Notable for the following components:      Result Value   Preg Test, Ur POSITIVE (*)    All other components within normal limits  POCT URINALYSIS DIPSTICK, ED / UC     EKG   Radiology No results found.  Procedures Procedures (including critical care time)  Medications Ordered in UC Medications - No data to display  Initial Impression / Assessment and Plan / UC Course  I have reviewed the triage vital signs and the nursing notes.  Pertinent labs & imaging results that were available during my care of the patient were reviewed by me and considered in my medical decision making (see chart for details).    Final Clinical Impressions(s) / UC Diagnoses   Final diagnoses:  Less than [redacted] weeks gestation of pregnancy  Lupus [facility])     Discharge Instructions     You need to call your doctors at [facility] to inform them of your pregnancy and ask them about continuing your lupus medication.  Start prenatal vitamins  Set up an appointment with an obstetrician.    ED Prescriptions    Medication Sig Dispense Auth. Provider   Prenatal Vit-Fe Fumarate-FA (PRENATAL VITAMIN) 27-0.8 MG TABS Take 1 tablet by mouth every morning. 90 tablet Elvina Sidle, MD     I have reviewed the PDMP during this encounter.   Elvina Sidle, MD 11/09/20 [phone]

## 2020-11-10 ENCOUNTER — Inpatient Hospital Stay (HOSPITAL_COMMUNITY)
Admission: AD | Admit: 2020-11-10 | Discharge: 2020-11-10 | Disposition: A | Discharge status: Home / Self Care | Payer: Medicare Other | Attending: Obstetrics and Gynecology | Admitting: Obstetrics and Gynecology

## 2020-11-10 ENCOUNTER — Other Ambulatory Visit: Payer: Self-pay

## 2020-11-10 ENCOUNTER — Encounter (HOSPITAL_COMMUNITY): Payer: Self-pay | Admitting: Obstetrics and Gynecology

## 2020-11-10 ENCOUNTER — Inpatient Hospital Stay (HOSPITAL_COMMUNITY): Payer: Medicare Other

## 2020-11-10 DIAGNOSIS — Z87891 Personal history of nicotine dependence: Secondary | ICD-10-CM | POA: Diagnosis not present

## 2020-11-10 DIAGNOSIS — Z3A01 Less than 8 weeks gestation of pregnancy: Secondary | ICD-10-CM | POA: Diagnosis not present

## 2020-11-10 DIAGNOSIS — O26891 Other specified pregnancy related conditions, first trimester: Secondary | ICD-10-CM | POA: Diagnosis not present

## 2020-11-10 DIAGNOSIS — R109 Unspecified abdominal pain: Secondary | ICD-10-CM

## 2020-11-10 DIAGNOSIS — R519 Headache, unspecified: Secondary | ICD-10-CM | POA: Diagnosis not present

## 2020-11-10 DIAGNOSIS — Z3490 Encounter for supervision of normal pregnancy, unspecified, unspecified trimester: Secondary | ICD-10-CM

## 2020-11-10 DIAGNOSIS — O99891 Other specified diseases and conditions complicating pregnancy: Secondary | ICD-10-CM | POA: Diagnosis present

## 2020-11-10 LAB — CBC
HCT: 37.9 % (ref 36.0–46.0)
Hemoglobin: 12.9 g/dL (ref 12.0–15.0)
MCH: 27.3 pg (ref 26.0–34.0)
MCHC: 34 g/dL (ref 30.0–36.0)
MCV: 80.3 fL (ref 80.0–100.0)
Platelets: UNDETERMINED 10*3/uL (ref 150–400)
RBC: 4.72 MIL/uL (ref 3.87–5.11)
RDW: 12.7 % (ref 11.5–15.5)
WBC: 4.8 10*3/uL (ref 4.0–10.5)
nRBC: 0 % (ref 0.0–0.2)

## 2020-11-10 LAB — COMPREHENSIVE METABOLIC PANEL
ALT: 13 U/L (ref 0–44)
AST: 15 U/L (ref 15–41)
Albumin: 3.3 g/dL — ABNORMAL LOW (ref 3.5–5.0)
Alkaline Phosphatase: 62 U/L (ref 38–126)
Anion gap: 9 (ref 5–15)
BUN: 5 mg/dL — ABNORMAL LOW (ref 6–20)
CO2: 24 mmol/L (ref 22–32)
Calcium: 8.8 mg/dL — ABNORMAL LOW (ref 8.9–10.3)
Chloride: 106 mmol/L (ref 98–111)
Creatinine, Ser: 0.69 mg/dL (ref 0.44–1.00)
GFR, Estimated: >60 mL/min (ref >60)
Glucose, Bld: 102 mg/dL — ABNORMAL HIGH (ref 70–99)
Potassium: 3.3 mmol/L — ABNORMAL LOW (ref 3.5–5.1)
Sodium: 139 mmol/L (ref 135–145)
Total Bilirubin: 0.7 mg/dL (ref 0.3–1.2)
Total Protein: 5.9 g/dL — ABNORMAL LOW (ref 6.5–8.1)

## 2020-11-10 LAB — URINALYSIS, ROUTINE W REFLEX MICROSCOPIC
Bilirubin Urine: NEGATIVE
Glucose, UA: NEGATIVE mg/dL
Hgb urine dipstick: NEGATIVE
Ketones, ur: NEGATIVE mg/dL
Leukocytes,Ua: NEGATIVE
Nitrite: NEGATIVE
Protein, ur: NEGATIVE mg/dL
Specific Gravity, Urine: 1.023 (ref 1.005–1.030)
pH: 6 (ref 5.0–8.0)

## 2020-11-10 LAB — WET PREP, GENITAL
Clue Cells Wet Prep HPF POC: NONE SEEN
Sperm: NONE SEEN
Trich, Wet Prep: NONE SEEN
Yeast Wet Prep HPF POC: NONE SEEN

## 2020-11-10 LAB — HCG, QUANTITATIVE, PREGNANCY: hCG, Beta Chain, Quant, S: 2518 m[IU]/mL — ABNORMAL HIGH (ref <5)

## 2020-11-10 MED ORDER — METOCLOPRAMIDE HCL 5 MG/ML IJ SOLN
10.0000 mg | Freq: Once | INTRAMUSCULAR | Status: AC
  Administered 2020-11-10: 10:00:00 10 mg via INTRAVENOUS
  Filled 2020-11-10: qty 2

## 2020-11-10 MED ORDER — LACTATED RINGERS IV BOLUS
500.0000 mL | Freq: Once | INTRAVENOUS | Status: AC
  Administered 2020-11-10: 10:00:00 500 mL via INTRAVENOUS

## 2020-11-10 MED ORDER — DEXAMETHASONE SODIUM PHOSPHATE 10 MG/ML IJ SOLN
10.0000 mg | Freq: Once | INTRAMUSCULAR | Status: AC
  Administered 2020-11-10: 10:00:00 10 mg via INTRAVENOUS
  Filled 2020-11-10: qty 1

## 2020-11-10 MED ORDER — DIPHENHYDRAMINE HCL 50 MG/ML IJ SOLN
25.0000 mg | Freq: Once | INTRAMUSCULAR | Status: AC
  Administered 2020-11-10: 10:00:00 25 mg via INTRAVENOUS
  Filled 2020-11-10: qty 1

## 2020-11-10 NOTE — MAU Provider Note (Signed)
History     CSN: [id number]  Arrival date and time: 11/10/20 7829  Event Date/Time  First Provider Initiated Contact with Patient 11/10/20 1055     Chief Complaint  Patient presents with  . Abdominal Pain  . Headache   HPI Kayla [name] is a 29 y.o. [id number] at [redacted]w[redacted]d who presents to MAU with chief complaint of headache.  This is a new problem, onset last week. Her pain is anterior, bilateral, does not radiate. Pain score on arrival to MAU is 8/10. She has attempted management with intermittent Tylenol but has not experienced relief. She denies aggravating or alleviating factors.  Patient also c/o lower abdominal pain, onset coinciding with onset of headache last week. Her pain is suprapubic, does not radiate. She denies abdominal tenderness, vaginal bleeding, dysuria, fever or recent illness.  OB History    Gravida  4   Para  1   Term  1   Preterm  0   AB  2   Living  1     SAB  2   IAB  0   Ectopic  0   Multiple  0   Live Births  1           Past Medical History:  Diagnosis Date  . Arthritis    "hands and legs" (01/08/2015)  . CAP (community acquired pneumonia) 01/07/2015  . Daily headache    "sometimes" (01/08/2015)  . GERD (gastroesophageal reflux disease)   . Hypertension    No longer takes meds  . Lung disease   . Lupus (HCC)   . Pulmonary hypertension (HCC)   . Sjogren's syndrome [facility])     Past Surgical History:  Procedure Laterality Date  . CARDIAC CATHETERIZATION N/A 09/09/2015   Procedure: Right Heart Cath;  Surgeon: Laurey Morale, MD;  Location: [facility] INVASIVE CV LAB;  Service: Cardiovascular;  Laterality: N/A;  . DILATION AND EVACUATION N/A 07/13/2015   Procedure: DILATATION AND EVACUATION;  Surgeon: Levie Heritage, DO;  Location: [facility] ORS;  Service: Gynecology;  Laterality: N/A;  . FINGER SURGERY Right 03/2014   "laceration, nerve/artery injury" 2nd digit  . VIDEO BRONCHOSCOPY Bilateral 01/12/2015   Procedure: VIDEO BRONCHOSCOPY WITH  FLUORO;  Surgeon: Leslye Peer, MD;  Location: [facility] ENDOSCOPY;  Service: Cardiopulmonary;  Laterality: Bilateral;    Family History  Problem Relation Age of Onset  . Diabetes Mother   . Arthritis Father   . Diabetes Maternal Aunt   . Diabetes Maternal Grandmother     Social History   Tobacco Use  . Smoking status: Former Smoker    Packs/day: 0.10    Years: 5.00    Pack years: 0.50    Types: Cigarettes    Quit date: 11/20/2014    Years since quitting: 5.9  . Smokeless tobacco: Never Used  Vaping Use  . Vaping Use: Never used  Substance Use Topics  . Alcohol use: Not Currently    Alcohol/week: 0.0 standard drinks  . Drug use: No    Allergies:  Allergies  Allergen Reactions  . Hydrocodone Nausea And Vomiting  . Zithromax [Azithromycin] Itching and Cough    Medications Prior to Admission  Medication Sig Dispense Refill Last Dose  . albuterol (PROVENTIL HFA;VENTOLIN HFA) 108 (90 Base) MCG/ACT inhaler Inhale 2 puffs into the lungs every 4 (four) hours as needed for wheezing or shortness of breath. 1 Inhaler 5 11/09/2020 at Unknown time  . fluticasone (FLONASE) 50 MCG/ACT nasal spray Place 2 sprays into both nostrils  daily. 16 g 2 11/09/2020 at Unknown time  . gabapentin (NEURONTIN) 100 MG capsule Take 3 capsules by mouth at bedtime.    Past Week at Unknown time  . hydroxychloroquine (PLAQUENIL) 200 MG tablet Take 400 mg by mouth daily.   11/09/2020 at Unknown time  . ondansetron (ZOFRAN ODT) 4 MG disintegrating tablet Take 1 tablet (4 mg total) by mouth every 8 (eight) hours as needed for nausea or vomiting. 20 tablet 0 11/09/2020 at Unknown time  . predniSONE (DELTASONE) 5 MG tablet Take 7 mg by mouth daily with breakfast.    11/09/2020  . Prenatal Vit-Fe Fumarate-FA (PRENATAL VITAMIN) 27-0.8 MG TABS Take 1 tablet by mouth every morning. 90 tablet 3 11/10/2020 at Unknown time  . ibuprofen (ADVIL,MOTRIN) 600 MG tablet Take 1 tablet (600 mg total) by mouth every 6 (six) hours.  30 tablet 0   . mycophenolate (CELLCEPT) 500 MG tablet Take 1,500 mg by mouth 2 (two) times daily.   11/08/2020  . naproxen (NAPROSYN) 500 MG tablet Take 1 tablet (500 mg total) by mouth 2 (two) times daily. 30 tablet 0   . sertraline (ZOLOFT) 25 MG tablet Take 25 mg by mouth daily.     . sertraline (ZOLOFT) 50 MG tablet Take by mouth.       Review of Systems  Gastrointestinal: Positive for abdominal pain.  Neurological: Positive for headaches.  All other systems reviewed and are negative.  Physical Exam   Blood pressure 95/63, pulse 80, temperature 98.4 F (36.9 C), temperature source Oral, resp. rate 19, last menstrual period 10/11/2020, SpO2 97 %, unknown if currently breastfeeding.  Physical Exam Vitals and nursing note reviewed. Exam conducted with a chaperone present.  Cardiovascular:     Rate and Rhythm: Normal rate.     Heart sounds: Normal heart sounds.  Pulmonary:     Effort: Pulmonary effort is normal.     Breath sounds: Normal breath sounds.  Abdominal:     General: Bowel sounds are normal.     Tenderness: There is no abdominal tenderness. There is no right CVA tenderness, left CVA tenderness or guarding.  Skin:    General: Skin is warm.     Capillary Refill: Capillary refill takes less than 2 seconds.  Neurological:     Mental Status: She is alert and oriented to person, place, and time.  Psychiatric:        Mood and Affect: Mood normal.        Behavior: Behavior normal.     MAU Course  Procedures  Orders Placed This Encounter  Procedures  . Wet prep, genital  . US OB LESS THAN 14 WEEKS WITH OB TRANSVAGINAL  . CBC  . Comprehensive metabolic panel  . hCG, quantitative, pregnancy  . Urinalysis, Routine w reflex microscopic  . Nursing communication  . Insert peripheral IV   Patient Vitals for the past 24 hrs:  BP Temp Temp src Pulse Resp SpO2  11/10/20 1101 95/63 98.4 F (36.9 C) Oral 80 19 97 %  11/10/20 0933 116/69 98.6 F (37 C) Oral 88 20 99 %   11/10/20 0919 118/65 98.8 F (37.1 C) Oral 91 18 98 %   Results for orders placed or performed during the hospital encounter of 11/10/20 (from the past 24 hour(s))  CBC     Status: None   Collection Time: 11/10/20  9:45 AM  Result Value Ref Range   WBC 4.8 4.0 - 10.5 K/uL   RBC 4.72 3.87 - 5.11  MIL/uL   Hemoglobin 12.9 12.0 - 15.0 g/dL   HCT 16.137.9 09.636.0 - 04.546.0 %   MCV 80.3 80.0 - 100.0 fL   MCH 27.3 26.0 - 34.0 pg   MCHC 34.0 30.0 - 36.0 g/dL   RDW 40.912.7 81.111.5 - 91.415.5 %   Platelets PLATELET CLUMPS NOTED ON SMEAR, UNABLE TO ESTIMATE 150 - 400 K/uL   nRBC 0.0 0.0 - 0.2 %  Comprehensive metabolic panel     Status: Abnormal   Collection Time: 11/10/20  9:45 AM  Result Value Ref Range   Sodium 139 135 - 145 mmol/L   Potassium 3.3 (L) 3.5 - 5.1 mmol/L   Chloride 106 98 - 111 mmol/L   CO2 24 22 - 32 mmol/L   Glucose, Bld 102 (H) 70 - 99 mg/dL   BUN 5 (L) 6 - 20 mg/dL   Creatinine, Ser 7.820.69 0.44 - 1.00 mg/dL   Calcium 8.8 (L) 8.9 - 10.3 mg/dL   Total Protein 5.9 (L) 6.5 - 8.1 g/dL   Albumin 3.3 (L) 3.5 - 5.0 g/dL   AST 15 15 - 41 U/L   ALT 13 0 - 44 U/L   Alkaline Phosphatase 62 38 - 126 U/L   Total Bilirubin 0.7 0.3 - 1.2 mg/dL   GFR, Estimated >95>60 >62>60 mL/min   Anion gap 9 5 - 15  hCG, quantitative, pregnancy     Status: Abnormal   Collection Time: 11/10/20  9:45 AM  Result Value Ref Range   hCG, Beta Chain, Quant, S 2,518 (H) <5 mIU/mL  Wet prep, genital     Status: Abnormal   Collection Time: 11/10/20  9:51 AM   Specimen: Vaginal  Result Value Ref Range   Yeast Wet Prep HPF POC NONE SEEN NONE SEEN   Trich, Wet Prep NONE SEEN NONE SEEN   Clue Cells Wet Prep HPF POC NONE SEEN NONE SEEN   WBC, Wet Prep HPF POC MANY (A) NONE SEEN   Sperm NONE SEEN   Urinalysis, Routine w reflex microscopic Urine, Clean Catch     Status: Abnormal   Collection Time: 11/10/20  9:51 AM  Result Value Ref Range   Color, Urine YELLOW YELLOW   APPearance HAZY (A) CLEAR   Specific Gravity, Urine  1.023 1.005 - 1.030   pH 6.0 5.0 - 8.0   Glucose, UA NEGATIVE NEGATIVE mg/dL   Hgb urine dipstick NEGATIVE NEGATIVE   Bilirubin Urine NEGATIVE NEGATIVE   Ketones, ur NEGATIVE NEGATIVE mg/dL   Protein, ur NEGATIVE NEGATIVE mg/dL   Nitrite NEGATIVE NEGATIVE   Leukocytes,Ua NEGATIVE NEGATIVE   US OB LESS THAN 14 WEEKS WITH OB TRANSVAGINAL  Result Date: 11/10/2020 CLINICAL DATA:  Pain/cramping EXAM: OBSTETRIC <14 WK US AND TRANSVAGINAL OB US TECHNIQUE: Both transabdominal and transvaginal ultrasound examinations were performed for complete evaluation of the gestation as well as the maternal uterus, adnexal regions, and pelvic cul-de-sac. Transvaginal technique was performed to assess early pregnancy. COMPARISON:  None. FINDINGS: Intrauterine gestational sac: Visualized-single Yolk sac:  Visualized Embryo:  Not visualized Cardiac Activity: Not visualized MSD: 7 mm   5 w 2 d Subchorionic hemorrhage:  None visualized. Maternal uterus/adnexae: Cervical os is closed. Right ovary measures 2.6 by 1.3 x 1.3 cm. Left ovary measures 2.7 x 2.2 x 2.3 cm. Dominant follicle/corpus luteum in left ovary. No other extrauterine pelvic mass. No free pelvic fluid. IMPRESSION: Gestational sac seen within the uterus containing a yolk sac but no fetal pole or fetal heart activity evident at  this time. This circumstance warrants a follow-up study in 10-14 days to assess for fetal pole and fetal heart activity. No subchorionic hemorrhage. Study otherwise unremarkable. Electronically Signed   By: Bretta Bang III M.D.   On: 11/10/2020 10:35   Assessment and Plan  --29 y.o. [id number] with SIUP --Headache resolving with treatments given in MAU --Discharge home in stable condition  Calvert Cantor, CNM 11/10/2020, 2:53 PM

## 2020-11-10 NOTE — Discharge Instructions (Signed)
Abdominal Pain During Pregnancy  Abdominal pain is common during pregnancy, and has many possible causes. Some causes are more serious than others, and sometimes the cause is not known. Abdominal pain can be a sign that labor is starting. It can also be caused by normal growth and stretching of muscles and ligaments during pregnancy. Always tell your health care provider if you have any abdominal pain. Follow these instructions at home:  Do not have sex or put anything in your vagina until your pain goes away completely.  Get plenty of rest until your pain improves.  Drink enough fluid to keep your urine pale yellow.  Take over-the-counter and prescription medicines only as told by your health care provider.  Keep all follow-up visits as told by your health care provider. This is important. Contact a health care provider if:  Your pain continues or gets worse after resting.  You have lower abdominal pain that: ? Comes and goes at regular intervals. ? Spreads to your back. ? Is similar to menstrual cramps.  You have pain or burning when you urinate. Get help right away if:  You have a fever or chills.  You have vaginal bleeding.  You are leaking fluid from your vagina.  You are passing tissue from your vagina.  You have vomiting or diarrhea that lasts for more than 24 hours.  Your baby is moving less than usual.  You feel very weak or faint.  You have shortness of breath.  You develop severe pain in your upper abdomen. Summary  Abdominal pain is common during pregnancy, and has many possible causes.  If you experience abdominal pain during pregnancy, tell your health care provider right away.  Follow your health care provider's home care instructions and keep all follow-up visits as directed. This information is not intended to replace advice given to you by your health care provider. Make sure you discuss any questions you have with your health care  provider. Document Revised: 02/25/2019 Document Reviewed: 02/09/2017 Elsevier Patient Education  2020 Elsevier Inc.  

## 2020-11-10 NOTE — Progress Notes (Signed)
GC/Chlamydia & wet prep cultures obtained via pt self swabbing. °

## 2020-11-10 NOTE — MAU Note (Signed)
Presents with c/o lower abdominal pain and H/A.  Reports has taken Tylenol & H/A unrelieved.  Reports seen in urgent care yesterday, had +UPT.  Unsure of LMP, states approximately 10/07/20.  Denies VB.

## 2020-11-11 LAB — GC/CHLAMYDIA PROBE AMP (~~LOC~~) NOT AT ARMC
Chlamydia: NEGATIVE
Comment: NEGATIVE
Comment: NORMAL
Neisseria Gonorrhea: NEGATIVE

## 2020-11-17 ENCOUNTER — Other Ambulatory Visit: Payer: Self-pay

## 2020-11-17 ENCOUNTER — Encounter: Payer: Self-pay | Admitting: Nurse Practitioner

## 2020-11-17 ENCOUNTER — Ambulatory Visit (INDEPENDENT_AMBULATORY_CARE_PROVIDER_SITE_OTHER): Payer: Medicare Other | Admitting: Nurse Practitioner

## 2020-11-17 VITALS — BP 122/80 | Ht 64.0 in | Wt 179.0 lb

## 2020-11-17 DIAGNOSIS — R310 Gross hematuria: Secondary | ICD-10-CM

## 2020-11-17 DIAGNOSIS — O26851 Spotting complicating pregnancy, first trimester: Secondary | ICD-10-CM

## 2020-11-17 DIAGNOSIS — Z01419 Encounter for gynecological examination (general) (routine) without abnormal findings: Secondary | ICD-10-CM

## 2020-11-17 DIAGNOSIS — Z3A01 Less than 8 weeks gestation of pregnancy: Secondary | ICD-10-CM

## 2020-11-17 NOTE — Patient Instructions (Signed)
Health Maintenance, Female Adopting a healthy lifestyle and getting preventive care are important in promoting health and wellness. Ask your health care provider about:  The right schedule for you to have regular tests and exams.  Things you can do on your own to prevent diseases and keep yourself healthy. What should I know about diet, weight, and exercise? Eat a healthy diet   Eat a diet that includes plenty of vegetables, fruits, low-fat dairy products, and lean protein.  Do not eat a lot of foods that are high in solid fats, added sugars, or sodium. Maintain a healthy weight Body mass index (BMI) is used to identify weight problems. It estimates body fat based on height and weight. Your health care provider can help determine your BMI and help you achieve or maintain a healthy weight. Get regular exercise Get regular exercise. This is one of the most important things you can do for your health. Most adults should:  Exercise for at least 150 minutes each week. The exercise should increase your heart rate and make you sweat (moderate-intensity exercise).  Do strengthening exercises at least twice a week. This is in addition to the moderate-intensity exercise.  Spend less time sitting. Even light physical activity can be beneficial. Watch cholesterol and blood lipids Have your blood tested for lipids and cholesterol at 30 years of age, then have this test every 5 years. Have your cholesterol levels checked more often if:  Your lipid or cholesterol levels are high.  You are older than 30 years of age.  You are at high risk for heart disease. What should I know about cancer screening? Depending on your health history and family history, you may need to have cancer screening at various ages. This may include screening for:  Breast cancer.  Cervical cancer.  Colorectal cancer.  Skin cancer.  Lung cancer. What should I know about heart disease, diabetes, and high blood  pressure? Blood pressure and heart disease  High blood pressure causes heart disease and increases the risk of stroke. This is more likely to develop in people who have high blood pressure readings, are of African descent, or are overweight.  Have your blood pressure checked: ? Every 3-5 years if you are 18-39 years of age. ? Every year if you are 40 years old or older. Diabetes Have regular diabetes screenings. This checks your fasting blood sugar level. Have the screening done:  Once every three years after age 40 if you are at a normal weight and have a low risk for diabetes.  More often and at a younger age if you are overweight or have a high risk for diabetes. What should I know about preventing infection? Hepatitis B If you have a higher risk for hepatitis B, you should be screened for this virus. Talk with your health care provider to find out if you are at risk for hepatitis B infection. Hepatitis C Testing is recommended for:  Everyone born from 1945 through 1965.  Anyone with known risk factors for hepatitis C. Sexually transmitted infections (STIs)  Get screened for STIs, including gonorrhea and chlamydia, if: ? You are sexually active and are younger than 30 years of age. ? You are older than 30 years of age and your health care provider tells you that you are at risk for this type of infection. ? Your sexual activity has changed since you were last screened, and you are at increased risk for chlamydia or gonorrhea. Ask your health care provider if   you are at risk.  Ask your health care provider about whether you are at high risk for HIV. Your health care provider may recommend a prescription medicine to help prevent HIV infection. If you choose to take medicine to prevent HIV, you should first get tested for HIV. You should then be tested every 3 months for as long as you are taking the medicine. Pregnancy  If you are about to stop having your period (premenopausal) and  you may become pregnant, seek counseling before you get pregnant.  Take 400 to 800 micrograms (mcg) of folic acid every day if you become pregnant.  Ask for birth control (contraception) if you want to prevent pregnancy. Osteoporosis and menopause Osteoporosis is a disease in which the bones lose minerals and strength with aging. This can result in bone fractures. If you are 65 years old or older, or if you are at risk for osteoporosis and fractures, ask your health care provider if you should:  Be screened for bone loss.  Take a calcium or vitamin D supplement to lower your risk of fractures.  Be given hormone replacement therapy (HRT) to treat symptoms of menopause. Follow these instructions at home: Lifestyle  Do not use any products that contain nicotine or tobacco, such as cigarettes, e-cigarettes, and chewing tobacco. If you need help quitting, ask your health care provider.  Do not use street drugs.  Do not share needles.  Ask your health care provider for help if you need support or information about quitting drugs. Alcohol use  Do not drink alcohol if: ? Your health care provider tells you not to drink. ? You are pregnant, may be pregnant, or are planning to become pregnant.  If you drink alcohol: ? Limit how much you use to 0-1 drink a day. ? Limit intake if you are breastfeeding.  Be aware of how much alcohol is in your drink. In the [place] one drink equals one 12 oz bottle of beer (355 mL), one 5 oz glass of wine (148 mL), or one 1 oz glass of hard liquor (44 mL). General instructions  Schedule regular health, dental, and eye exams.  Stay current with your vaccines.  Tell your health care provider if: ? You often feel depressed. ? You have ever been abused or do not feel safe at home. Summary  Adopting a healthy lifestyle and getting preventive care are important in promoting health and wellness.  Follow your health care provider's instructions about healthy  diet, exercising, and getting tested or screened for diseases.  Follow your health care provider's instructions on monitoring your cholesterol and blood pressure. This information is not intended to replace advice given to you by your health care provider. Make sure you discuss any questions you have with your health care provider. Document Revised: 10/31/2018 Document Reviewed: 10/31/2018 Elsevier Patient Education  2020 Elsevier Inc.  

## 2020-11-17 NOTE — Progress Notes (Signed)
   Kayla [name] February 24, 1991 [id number]   History:  29 y.o. [id number] presents for annual exam. Currently pregnant at [redacted] weeks 2 days. Complains of blood in urine that she first noticed at office today. Negative UA 1 week ago at ER where she was seen for headache and abdominal pain. Pregnancy was discovered during that work up. Normal pap history. Lupus managed by rheumatology. She has appointment with them next week. She is not in relationship with the father but it is the same father to her first child. History of miscarriage x 2.   Gynecologic History Patient's last menstrual period was 10/11/2020.   Contraception: none Last Pap: 05/22/2018. Results were: normal  Past medical history, past surgical history, family history and social history were all reviewed and documented in the EPIC chart.  ROS:  A ROS was performed and pertinent positives and negatives are included.  Exam:  Vitals:   11/17/20 1109  BP: 122/80  Weight: 179 lb (81.2 kg)  Height: 5\' 4"  (1.626 m)   Body mass index is 30.73 kg/m.  General appearance:  Normal Thyroid:  Symmetrical, normal in size, without palpable masses or nodularity. Respiratory  Auscultation:  Clear without wheezing or rhonchi Cardiovascular  Auscultation:  Regular rate, without rubs, murmurs or gallops  Edema/varicosities:  Not grossly evident Abdominal  Soft,nontender, without masses, guarding or rebound.  Liver/spleen:  No organomegaly noted  Hernia:  None appreciated  Skin  Inspection:  Grossly normal   Breasts: Examined lying and sitting.   Right: Without masses, retractions, discharge or axillary adenopathy.   Left: Without masses, retractions, discharge or axillary adenopathy. Gentitourinary   Inguinal/mons:  Normal without inguinal adenopathy  External genitalia:  Normal  BUS/Urethra/Skene's glands:  Normal  Vagina:  Bleeding  Cervix:  Normal  Uterus:  Normal in size, shape and contour.  Midline and mobile  Adnexa/parametria:      Rt: Without masses or tenderness.   Lt: Without masses or tenderness.  Anus and perineum: Normal  Assessment/Plan:  29 y.o. 26 for breast and pelvic exam.   Well female exam with routine gynecological exam - Education provided on SBEs, importance of preventative screenings, current guidelines, high calcium diet, regular exercise, and multivitamin daily. Labs with rheumatology.   Less than [redacted] weeks gestation of pregnancy - [redacted]w[redacted]d. LMP 10/04/2020. She is aware we do not deliver babies and it is recommended she be followed by rheumatology and a high risk OB due to lupus and history of miscarriage. She has appointment with Rheumaology next week. Discussed safe pregnancy behaviors. Recommend applying for pregnancy medicaid.   Spotting during pregnancy in first trimester - Plan: B-HCG Quant. Bleeding without abdominal pain. Started today.   Gross hematuria - Plan: Urinalysis,Complete w/RFL Culture. Negative UA.  Screening for cervical cancer - Normal pap history. Will repeat at 5-year interval.  Follow up in 1 year for annual.       10/06/2020 [facility], 11:18 AM 11/17/2020

## 2020-11-18 ENCOUNTER — Inpatient Hospital Stay (HOSPITAL_COMMUNITY)
Admission: AD | Admit: 2020-11-18 | Discharge: 2020-11-18 | Disposition: A | Discharge status: Home / Self Care | Payer: Medicare Other | Attending: Family Medicine | Admitting: Family Medicine

## 2020-11-18 ENCOUNTER — Other Ambulatory Visit: Payer: Self-pay

## 2020-11-18 ENCOUNTER — Inpatient Hospital Stay (HOSPITAL_COMMUNITY): Payer: Medicare Other

## 2020-11-18 ENCOUNTER — Encounter (HOSPITAL_COMMUNITY): Payer: Self-pay | Admitting: Family Medicine

## 2020-11-18 ENCOUNTER — Telehealth: Payer: Self-pay | Admitting: *Deleted

## 2020-11-18 ENCOUNTER — Other Ambulatory Visit: Payer: Self-pay | Admitting: Nurse Practitioner

## 2020-11-18 DIAGNOSIS — O208 Other hemorrhage in early pregnancy: Secondary | ICD-10-CM | POA: Diagnosis not present

## 2020-11-18 DIAGNOSIS — Z87891 Personal history of nicotine dependence: Secondary | ICD-10-CM | POA: Insufficient documentation

## 2020-11-18 DIAGNOSIS — O99351 Diseases of the nervous system complicating pregnancy, first trimester: Secondary | ICD-10-CM | POA: Insufficient documentation

## 2020-11-18 DIAGNOSIS — Z3A01 Less than 8 weeks gestation of pregnancy: Secondary | ICD-10-CM | POA: Insufficient documentation

## 2020-11-18 DIAGNOSIS — O209 Hemorrhage in early pregnancy, unspecified: Secondary | ICD-10-CM

## 2020-11-18 DIAGNOSIS — O468X1 Other antepartum hemorrhage, first trimester: Secondary | ICD-10-CM

## 2020-11-18 DIAGNOSIS — O039 Complete or unspecified spontaneous abortion without complication: Secondary | ICD-10-CM

## 2020-11-18 DIAGNOSIS — Z885 Allergy status to narcotic agent status: Secondary | ICD-10-CM | POA: Insufficient documentation

## 2020-11-18 DIAGNOSIS — G43009 Migraine without aura, not intractable, without status migrainosus: Secondary | ICD-10-CM | POA: Diagnosis not present

## 2020-11-18 DIAGNOSIS — Z881 Allergy status to other antibiotic agents status: Secondary | ICD-10-CM | POA: Insufficient documentation

## 2020-11-18 LAB — HCG, QUANTITATIVE, PREGNANCY: HCG, Total, QN: 2888 m[IU]/mL

## 2020-11-18 MED ORDER — DEXAMETHASONE SODIUM PHOSPHATE 10 MG/ML IJ SOLN
10.0000 mg | Freq: Once | INTRAMUSCULAR | Status: AC
  Administered 2020-11-18: 11:00:00 10 mg via INTRAVENOUS
  Filled 2020-11-18: qty 1

## 2020-11-18 MED ORDER — LACTATED RINGERS IV BOLUS
1000.0000 mL | Freq: Once | INTRAVENOUS | Status: AC
  Administered 2020-11-18: 11:00:00 1000 mL via INTRAVENOUS

## 2020-11-18 MED ORDER — DIPHENHYDRAMINE HCL 50 MG/ML IJ SOLN
25.0000 mg | Freq: Once | INTRAMUSCULAR | Status: AC
  Administered 2020-11-18: 11:00:00 25 mg via INTRAVENOUS
  Filled 2020-11-18: qty 1

## 2020-11-18 MED ORDER — BUTALBITAL-APAP-CAFFEINE 50-325-40 MG PO TABS: 1.0000 | ORAL_TABLET | Freq: Four times a day (QID) | ORAL | 0 refills | Status: DC | PRN

## 2020-11-18 MED ORDER — METOCLOPRAMIDE HCL 5 MG/ML IJ SOLN
10.0000 mg | Freq: Once | INTRAMUSCULAR | Status: AC
  Administered 2020-11-18: 11:00:00 10 mg via INTRAVENOUS
  Filled 2020-11-18: qty 2

## 2020-11-18 NOTE — Telephone Encounter (Signed)
Tiffany see patient question.

## 2020-11-18 NOTE — Telephone Encounter (Signed)
Kayla [name] patient was admitted/discharged today from hospital,Just wanted you to know this, and want to confirm you want her to still do the labs on Tuesday? Please advise

## 2020-11-18 NOTE — Telephone Encounter (Signed)
Yes I would still like her to come in for the lab work. Thank you for checking! I was not aware she was seen in the ER today.

## 2020-11-18 NOTE — MAU Note (Signed)
Kayla [name] is a 29 y.o. at [redacted]w[redacted]d here in MAU reporting: vaginal bleeding since yesterday morning. States bleeding is lighter than a period, sees it mostly when she is using the bathroom into the toilet and when she wipes. Also reporting a headache. No abdominal pain. No vaginal discharge. No recent IC.  Onset of complaint: yesterday  Pain score: 10/10  Vitals:   11/18/20 0858  BP: 126/80  Pulse: 89  Resp: 16  Temp: 98.7 F (37.1 C)  SpO2: 100%     Lab orders placed from triage: UA

## 2020-11-18 NOTE — MAU Provider Note (Signed)
History     CSN: [id number]  Arrival date and time: 11/18/20 9233   Event Date/Time   First Provider Initiated Contact with Patient 11/18/20 0930      Chief Complaint  Patient presents with  . Vaginal Bleeding  . Headache   Ms. Kayla [name] is a 29 y.o. year old G50P1021 female at [redacted]w[redacted]d weeks gestation who presents to MAU reporting VB like a period that started yesterday morning. She reports the VB is lighter than a period; blood in the toilet and with wiping. She also complains of H/A that started at 0500; rated 10/10. She hasn't taken any medication for the H/A. She reports having migraines in pregnancy in the past, taking Fioricet with relief. She denies abdominal pain, vaginal d/c or recent SI. She desires to receive [facility] with [facility].   OB History    Gravida  4   Para  1   Term  1   Preterm  0   AB  2   Living  1     SAB  2   IAB  0   Ectopic  0   Multiple  0   Live Births  1           Past Medical History:  Diagnosis Date  . Arthritis    "hands and legs" (01/08/2015)  . CAP (community acquired pneumonia) 01/07/2015  . Daily headache    "sometimes" (01/08/2015)  . GERD (gastroesophageal reflux disease)   . Hypertension    No longer takes meds  . Lung disease   . Lupus (HCC)   . Pulmonary hypertension (HCC)   . Sjogren's syndrome [facility])     Past Surgical History:  Procedure Laterality Date  . CARDIAC CATHETERIZATION N/A 09/09/2015   Procedure: Right Heart Cath;  Surgeon: Laurey Morale, MD;  [facility] INVASIVE CV LAB;  Service: Cardiovascular;  Laterality: N/A;  . DILATION AND EVACUATION N/A 07/13/2015   Procedure: DILATATION AND EVACUATION;  Surgeon: Levie Heritage, DO;  Location: [facility] ORS;  Service: Gynecology;  Laterality: N/A;  . FINGER SURGERY Right 03/2014   "laceration, nerve/artery injury" 2nd digit  . VIDEO BRONCHOSCOPY Bilateral 01/12/2015   Procedure: VIDEO BRONCHOSCOPY WITH FLUORO;  Surgeon: Leslye Peer, MD;  Location: [facility] ENDOSCOPY;   Service: Cardiopulmonary;  Laterality: Bilateral;    Family History  Problem Relation Age of Onset  . Diabetes Mother   . Arthritis Father   . Diabetes Maternal Aunt   . Diabetes Maternal Grandmother     Social History   Tobacco Use  . Smoking status: Former Smoker    Packs/day: 0.10    Years: 5.00    Pack years: 0.50    Types: Cigarettes    Quit date: 11/20/2014    Years since quitting: 6.0  . Smokeless tobacco: Never Used  Vaping Use  . Vaping Use: Never used  Substance Use Topics  . Alcohol use: Not Currently    Alcohol/week: 0.0 standard drinks  . Drug use: No    Allergies:  Allergies  Allergen Reactions  . Hydrocodone Nausea And Vomiting  . Zithromax [Azithromycin] Itching and Cough    No medications prior to admission.    Review of Systems  Constitutional: Negative.   Eyes: Negative.   Respiratory: Negative.   Cardiovascular: Negative.   Gastrointestinal: Negative.   Endocrine: Negative.   Genitourinary: Positive for vaginal bleeding.  Musculoskeletal: Negative.   Skin: Negative.   Allergic/Immunologic: Negative.   Neurological: Positive for headaches (10/10).  Hematological: Negative.   Psychiatric/Behavioral: Negative.    Physical Exam   Blood pressure 122/70, pulse 93, temperature 98.7 F (37.1 C), temperature source Oral, resp. rate 16, height 5\' 3"  (1.6 m), weight 80.1 kg, last menstrual period 10/11/2020, SpO2 100 %, unknown if currently breastfeeding.  Physical Exam Vitals and nursing note reviewed. Exam conducted with a chaperone present.  Constitutional:      Appearance: Normal appearance. She is normal weight.  HENT:     Head: Normocephalic and atraumatic.  Cardiovascular:     Rate and Rhythm: Normal rate.     Pulses: Normal pulses.  Pulmonary:     Effort: Pulmonary effort is normal.  Abdominal:     General: Abdomen is flat.  Genitourinary:    Comments: Uterus: non-tender, SE: cervix is smooth, pink, no lesions, moderate amt of  bright, red blood with clots in vaginal vault -- WP, GC/CT done, closed/long/firm, no CMT or friability, no adnexal tenderness  Musculoskeletal:     Cervical back: Normal range of motion.  Neurological:     Mental Status: She is alert and oriented to person, place, and time.  Psychiatric:        Mood and Affect: Mood normal.        Behavior: Behavior normal.        Thought Content: Thought content normal.        Judgment: Judgment normal.     MAU Course  Procedures  MDM LR bolus 1000 ml Received migraine headache cocktail (IVFs with benadryl 25 mg IVP, metoclopramide 10 mg IVP, Decadron 10 mg IVP) -- reports relief. OB <14 wks with TV U/S  10/13/2020 OB Transvaginal  Result Date: 11/18/2020 CLINICAL DATA:  Bleeding for 1 day. Gestational age by last menstrual. Is 5 weeks 3 days. EXAM: TRANSVAGINAL OB ULTRASOUND TECHNIQUE: Transvaginal ultrasound was performed for complete evaluation of the gestation as well as the maternal uterus, adnexal regions, and pelvic cul-de-sac. COMPARISON:  Pelvic ultrasound dated 11/10/2020. FINDINGS: Intrauterine gestational sac: Single Yolk sac:  Visualized. Embryo:  Visualized. Cardiac Activity: Visualized. Heart Rate: 92 bpm CRL:   3.3 mm   5  w 6 d                  11/12/2020 EDC: 07/15/2021 Subchorionic hemorrhage: Small to moderate chorionic hemorrhage is noted superior to the gestational sac. Maternal uterus/adnexae: Trace free fluid in the pelvis. Normal adnexa. IMPRESSION: Single intrauterine gestation with fetal cardiac activity. Electronically Signed   By: 07/17/2021 M.D.   On: 11/18/2020 11:40    Assessment and Plan  Migraine without aura and without status migrainosus, not intractable  - Rx for Fioricet 1-2 tabs every 6 hours - Information provided on recurrent migraine H/A   Vaginal bleeding in pregnancy, first trimester   - Information provided on VB in pregnancy  Subchorionic hematoma in first trimester, single or unspecified fetus - Information  provided on [facility] & threatened miscarriage - Advised bleeding may still continue - Return to MAU:  If you have heavier bleeding that soaks through more that 2 pads per hour for an hour or more  If you bleed so much that you feel like you might pass out or you do pass out  If you have significant abdominal pain that is not improved with Tylenol 1000 mg every 6 hours as needed for pain  If you develop a fever > 100.5    - Discharge patient - New contact information for [facility] given - advised to call -  Patient verbalized an understanding of the plan of care and agrees.     Raelyn Mora, CNM 11/18/2020, 9:30 AM

## 2020-11-18 NOTE — Telephone Encounter (Signed)
hCG should double every 48 hours. When checked on 12/21 is was 2500, and yesterday it was 2800. There is not a specific number we look for this early as the range is very large but we base it off of serial checks. I saw the ER note from today about the subchorionic hemorrhage. This can also cause bleeding like you are having but with the hCG I worry about miscarriage. The recheck on Tuesday will tell us more.

## 2020-11-18 NOTE — Telephone Encounter (Signed)
-----   Message from [name], NP sent at 11/18/2020  7:59 AM EST ----- Please let Kayla [name] know that her hCG is not where we expect it to be and that the bleeding that she was experiencing yesterday is most likely because she is miscarrying. I would like her to return on Monday for an hCG recheck. At this time it is expected that her body will naturally miscarry. She should avoid intercourse and tampon use and stick to sanitary pads. The bleeding may be heavy at times and last up to a week. If she continues to bleed heavy for longer than that she needs to be seen. Thank you.

## 2020-11-19 LAB — URINALYSIS, COMPLETE W/RFL CULTURE
Bilirubin Urine: NEGATIVE
Glucose, UA: NEGATIVE
Hyaline Cast: NONE SEEN /LPF
Ketones, ur: NEGATIVE
Leukocyte Esterase: NEGATIVE
Nitrites, Initial: NEGATIVE
Protein, ur: NEGATIVE
RBC / HPF: >=60 /HPF — AB (ref 0–2)
Specific Gravity, Urine: 1.02 (ref 1.001–1.03)
pH: 7.5 (ref 5.0–8.0)

## 2020-11-19 LAB — URINE CULTURE
MICRO NUMBER:: 11361905
SPECIMEN QUALITY:: ADEQUATE

## 2020-11-19 LAB — CULTURE INDICATED

## 2020-11-24 ENCOUNTER — Other Ambulatory Visit (INDEPENDENT_AMBULATORY_CARE_PROVIDER_SITE_OTHER): Payer: Medicare Other

## 2020-11-24 ENCOUNTER — Other Ambulatory Visit: Payer: Self-pay

## 2020-11-24 DIAGNOSIS — O039 Complete or unspecified spontaneous abortion without complication: Secondary | ICD-10-CM

## 2020-11-24 LAB — HCG, QUANTITATIVE, PREGNANCY: HCG, Total, QN: 23 m[IU]/mL

## 2020-11-26 ENCOUNTER — Encounter: Payer: Self-pay | Admitting: *Deleted

## 2020-12-16 ENCOUNTER — Ambulatory Visit (INDEPENDENT_AMBULATORY_CARE_PROVIDER_SITE_OTHER): Payer: Medicare Other | Admitting: Family Medicine

## 2020-12-16 ENCOUNTER — Encounter: Payer: Self-pay | Admitting: Family Medicine

## 2020-12-16 ENCOUNTER — Other Ambulatory Visit: Payer: Self-pay

## 2020-12-16 VITALS — BP 112/84 | HR 82 | Wt 180.1 lb

## 2020-12-16 DIAGNOSIS — O039 Complete or unspecified spontaneous abortion without complication: Secondary | ICD-10-CM | POA: Diagnosis not present

## 2020-12-16 DIAGNOSIS — Z3202 Encounter for pregnancy test, result negative: Secondary | ICD-10-CM

## 2020-12-16 LAB — POCT PREGNANCY, URINE: Preg Test, Ur: NEGATIVE

## 2020-12-16 NOTE — Progress Notes (Signed)
GYNECOLOGY OFFICE VISIT NOTE  History:   Kayla [name] is a 30 y.o. [phone] with hx of lupus on multiple DMARDs and significant complications (ILD, Sjogren's, cHTN, pHTN) here today for an appt originally scheduled as a new OB but patient appears to have had a miscarriage.   Review of chart shows that patient was seen on 11/10/2020 for headache in the MAU, at that time IUP seen but no fetal pole. Beta 2518 at that time Followed up with [facility] on 11/17/2020, instructed to f/u with OB practice, hcg at that time 2888 Seen in MAU for vaginal bleeding 11/18/2020, at that time TVUS showed IUP w FHR of 92 bpm Follow up hcg at our office on 11/24/2020 was 23 Saw rheumatology on 11/26/2020 at [facility], discussed with her she was likely miscarrying, she did not want birth control at that time  Today reports she is no longer bleeding, no abdominal pain Kept appt today because other providers told her to She does not wish to have birth control She is hoping to get pregnant in the near future  Past Medical History:  Diagnosis Date  . Acute respiratory failure with hypoxemia (HCC) 09/19/2017  . Acute respiratory failure with hypoxia (HCC)   . Arthritis    "hands and legs" (01/08/2015)  . CAP (community acquired pneumonia) 01/07/2015  . Chronic Respiratory failure with oxygen requirement affecting pregnancy, antepartum 05/16/2016   Resolved pulmonary HTN on 2018 echo (in our epic).   Pt off O2 in 2018.  . Daily headache    "sometimes" (01/08/2015)  . GERD (gastroesophageal reflux disease)   . Hypertension    No longer takes meds  . Lung disease   . Lupus (HCC)   . Multifocal pneumonia   . Pulmonary hypertension (HCC)   . Sjogren's syndrome [facility])     Past Surgical History:  Procedure Laterality Date  . CARDIAC CATHETERIZATION N/A 09/09/2015   Procedure: Right Heart Cath;  Surgeon: Laurey Morale, MD;  Location: [facility] INVASIVE CV LAB;  Service: Cardiovascular;  Laterality: N/A;   . DILATION AND EVACUATION N/A 07/13/2015   Procedure: DILATATION AND EVACUATION;  Surgeon: Levie Heritage, DO;  Location: [facility] ORS;  Service: Gynecology;  Laterality: N/A;  . FINGER SURGERY Right 03/2014   "laceration, nerve/artery injury" 2nd digit  . VIDEO BRONCHOSCOPY Bilateral 01/12/2015   Procedure: VIDEO BRONCHOSCOPY WITH FLUORO;  Surgeon: Leslye Peer, MD;  Location: [facility] ENDOSCOPY;  Service: Cardiopulmonary;  Laterality: Bilateral;    The following portions of the patient's history were reviewed and updated as appropriate: allergies, current medications, past family history, past medical history, past social history, past surgical history and problem list.   Health Maintenance:  Normal pap and negative HRHPV: NILM pap w TZ absent on 05/22/2018.  Normal mammogram: n/a.   Review of Systems:  Pertinent items noted in HPI and remainder of comprehensive ROS otherwise negative.  Physical Exam:  BP 112/84   Pulse 82   Wt 180 lb 1.6 oz (81.7 kg)   LMP 10/11/2020   BMI 31.90 kg/m  CONSTITUTIONAL: Well-developed, well-nourished female in no acute distress.  HEENT:  Normocephalic, atraumatic. External right and left ear normal. No scleral icterus.  NECK: Normal range of motion, supple, no masses noted on observation SKIN: No rash noted. Not diaphoretic. No erythema. No pallor. MUSCULOSKELETAL: Normal range of motion. No edema noted. NEUROLOGIC: Alert and oriented to person, place, and time. Normal muscle tone coordination.  PSYCHIATRIC: Normal mood and affect. Normal behavior. Normal  judgment and thought content. RESPIRATORY: Effort normal, no problems with respiration noted  Labs and Imaging Results for orders placed or performed in visit on 12/16/20 (from the past 168 hour(s))  Pregnancy, urine POC   Collection Time: 12/16/20 10:31 AM  Result Value Ref Range   Preg Test, Ur NEGATIVE NEGATIVE   US OB Transvaginal  Result Date: 11/18/2020 CLINICAL DATA:  Bleeding for 1 day.  Gestational age by last menstrual. Is 5 weeks 3 days. EXAM: TRANSVAGINAL OB ULTRASOUND TECHNIQUE: Transvaginal ultrasound was performed for complete evaluation of the gestation as well as the maternal uterus, adnexal regions, and pelvic cul-de-sac. COMPARISON:  Pelvic ultrasound dated 11/10/2020. FINDINGS: Intrauterine gestational sac: Single Yolk sac:  Visualized. Embryo:  Visualized. Cardiac Activity: Visualized. Heart Rate: 92 bpm CRL:   3.3 mm   5  w 6 d                  [place] EDC: 07/15/2021 Subchorionic hemorrhage: Small to moderate chorionic hemorrhage is noted superior to the gestational sac. Maternal uterus/adnexae: Trace free fluid in the pelvis. Normal adnexa. IMPRESSION: Single intrauterine gestation with fetal cardiac activity. Electronically Signed   By: Romona Curls M.D.   On: 11/18/2020 11:40      Assessment and Plan:   Problem List Items Addressed This Visit   None   Visit Diagnoses    Miscarriage    -  Primary     Reviewed chart in detail. UPT obtained today which was negative. Discussed with patient would be prudent to have birth control method at this time given significant rheumatic disease, she declines. Aware of [facility] pregnancy [facility], will coordinate with them.   No follow-ups on file.    Total face-to-face time with patient: 20 minutes.  Over 50% of encounter was spent on counseling and coordination of care.   Venora Maples, MD/[facility] [employer], [facility]

## 2021-01-02 ENCOUNTER — Other Ambulatory Visit: Payer: Self-pay

## 2021-01-02 ENCOUNTER — Ambulatory Visit (HOSPITAL_COMMUNITY)
Admission: EM | Admit: 2021-01-02 | Discharge: 2021-01-02 | Disposition: A | Discharge status: Home / Self Care | Payer: Medicare Other | Attending: Student | Admitting: Student

## 2021-01-02 ENCOUNTER — Encounter (HOSPITAL_COMMUNITY): Payer: Self-pay

## 2021-01-02 DIAGNOSIS — B373 Candidiasis of vulva and vagina: Secondary | ICD-10-CM | POA: Diagnosis not present

## 2021-01-02 DIAGNOSIS — B3731 Acute candidiasis of vulva and vagina: Secondary | ICD-10-CM

## 2021-01-02 DIAGNOSIS — L03319 Cellulitis of trunk, unspecified: Secondary | ICD-10-CM

## 2021-01-02 DIAGNOSIS — N898 Other specified noninflammatory disorders of vagina: Secondary | ICD-10-CM | POA: Diagnosis not present

## 2021-01-02 DIAGNOSIS — L02219 Cutaneous abscess of trunk, unspecified: Secondary | ICD-10-CM | POA: Diagnosis not present

## 2021-01-02 MED ORDER — FLUCONAZOLE 150 MG PO TABS: 150.0000 mg | ORAL_TABLET | Freq: Every day | ORAL | 0 refills | Status: DC

## 2021-01-02 MED ORDER — DOXYCYCLINE HYCLATE 100 MG PO CAPS: 100.0000 mg | ORAL_CAPSULE | Freq: Two times a day (BID) | ORAL | 0 refills | Status: AC

## 2021-01-02 NOTE — Discharge Instructions (Addendum)
-  For your abscess/skin infection, start the antibiotic- doxycycline twice daily for 7 days.  -For your yeast infection, take one pill of Diflucan (fluconazole) today. Take the second pill in 3 days if symptoms persist.  -Seek immediate medical attention if you develop fevers, chills, abdominal pain, back pain, worsening vaginal discharge despite treatment, chest pain, shortness of breath.

## 2021-01-02 NOTE — ED Provider Notes (Signed)
[facility]    CSN: [id number] Arrival date & time: 01/02/21  1523      History   Chief Complaint Chief Complaint  Patient presents with  . Abscess    HPI Kayla [name] is a 30 y.o. female Pt c/o "boil" abscess to right groin for approx 2 weeks. Also with vaginal discharge for 2 days.  History of respiratory failure, arthritis, pneumonia, headaches, GERD, hypertension, lupus, lung disease, pulmonary hypertension, Sjogren's syndrome, GERD, ILD, insomnia.   -States she has had this abscess for 2 weeks.  She has been pressing on it in the shower states it has drained a mixture of blood and pus and is getting better.  Denies fevers, chills. -This patient also states she has had 2 days of vaginal discharge.  Describes it as white and clumpy.  Endorses external vaginal itching.  Denies new partners, STI risk. Denies hematuria, dysuria, frequency, urgency, back pain, n/v/d/abd pain, fevers/chills.   HPI  Past Medical History:  Diagnosis Date  . Acute respiratory failure with hypoxemia (HCC) 09/19/2017  . Acute respiratory failure with hypoxia (HCC)   . Arthritis    "hands and legs" (01/08/2015)  . CAP (community acquired pneumonia) 01/07/2015  . Chronic Respiratory failure with oxygen requirement affecting pregnancy, antepartum 05/16/2016   Resolved pulmonary HTN on 2018 echo (in our epic).   Pt off O2 in 2018.  . Daily headache    "sometimes" (01/08/2015)  . GERD (gastroesophageal reflux disease)   . Hypertension    No longer takes meds  . Lung disease   . Lupus (HCC)   . Multifocal pneumonia   . Pulmonary hypertension (HCC)   . Sjogren's syndrome [facility])     Patient Active Problem List   Diagnosis Date Noted  . Gastroesophageal reflux disease without esophagitis 07/13/2020  . ILD (interstitial lung disease) (HCC)   . History of IUFD 02/14/2017  . 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 Chronic hypertension 03/14/2016  . Other  secondary pulmonary hypertension (HCC) 09/04/2015  . Lupus (systemic lupus erythematosus) (HCC) 08/21/2015  . Loud P2 (pulmonary S2, second heart sound) 08/21/2015  . Insomnia 08/21/2015  . Systemic lupus erythematosus (SLE) affecting pregnancy, antepartum (HCC) 07/06/2015  . Sjogren's syndrome (HCC) 04/28/2015    Past Surgical History:  Procedure Laterality Date  . CARDIAC CATHETERIZATION N/A 09/09/2015   Procedure: Right Heart Cath;  Surgeon: Laurey Morale, MD;  Location: [facility] INVASIVE CV LAB;  Service: Cardiovascular;  Laterality: N/A;  . DILATION AND EVACUATION N/A 07/13/2015   Procedure: DILATATION AND EVACUATION;  Surgeon: Levie Heritage, DO;  Location: [facility] ORS;  Service: Gynecology;  Laterality: N/A;  . FINGER SURGERY Right 03/2014   "laceration, nerve/artery injury" 2nd digit  . VIDEO BRONCHOSCOPY Bilateral 01/12/2015   Procedure: VIDEO BRONCHOSCOPY WITH FLUORO;  Surgeon: Leslye Peer, MD;  Location: [facility] ENDOSCOPY;  Service: Cardiopulmonary;  Laterality: Bilateral;    OB History    Gravida  4   Para  1   Term  1   Preterm  0   AB  2   Living  1     SAB  2   IAB  0   Ectopic  0   Multiple  0   Live Births  1            Home Medications    Prior to Admission medications   Medication Sig Start Date End Date Taking? Authorizing Provider  azaTHIOprine (IMURAN) 50 MG  tablet Take by mouth. 11/26/20  Yes [provider]  doxycycline (VIBRAMYCIN) 100 MG capsule Take 1 capsule (100 mg total) by mouth 2 (two) times daily for 7 days. 01/02/21 01/09/21 Yes Rhys MartiniGraham, Tryston Gilliam E, PA-C  fluconazole (DIFLUCAN) 150 MG tablet Take 1 tablet (150 mg total) by mouth daily. Take one pill today. Take the second pill if symptoms worsen or persist on day 3. 01/02/21  Yes Rhys MartiniGraham, Rakesh Dutko E, PA-C  hydroxychloroquine (PLAQUENIL) 200 MG tablet Take 400 mg by mouth daily.   Yes [provider]  oxyCODONE-acetaminophen (PERCOCET) 10-325 MG tablet Take 1 tablet by mouth 3 (three)  times daily as needed. 12/28/20  Yes [provider]  predniSONE (DELTASONE) 5 MG tablet Take 7 mg by mouth daily with breakfast.    Yes [provider]  albuterol (PROVENTIL HFA;VENTOLIN HFA) 108 (90 Base) MCG/ACT inhaler Inhale 2 puffs into the lungs every 4 (four) hours as needed for wheezing or shortness of breath. 06/09/17   Adam PhenixArnold, James G, MD  butalbital-acetaminophen-caffeine (FIORICET) [id number] MG tablet Take 1-2 tablets by mouth every 6 (six) hours as needed for headache. 11/18/20   Raelyn Moraawson, Rolitta, CNM  gabapentin (NEURONTIN) 100 MG capsule Take 3 capsules by mouth at bedtime.  Patient not taking: Reported on 12/16/2020 02/11/19   [provider]  mycophenolate (CELLCEPT) 500 MG tablet Take 1,500 mg by mouth 2 (two) times daily. Patient not taking: Reported on 12/16/2020 06/15/18   [provider]  ondansetron (ZOFRAN ODT) 4 MG disintegrating tablet Take 1 tablet (4 mg total) by mouth every 8 (eight) hours as needed for nausea or vomiting. Patient not taking: Reported on 12/16/2020 08/18/20   Olivia MackieWallace, Tiffany A, NP  Prenatal Vit-Fe Fumarate-FA (PRENATAL VITAMIN) 27-0.8 MG TABS Take 1 tablet by mouth every morning. 11/09/20   Elvina SidleLauenstein, Kurt, MD  sertraline (ZOLOFT) 25 MG tablet Take 25 mg by mouth daily. 09/27/17 09/22/20  [provider]  amLODipine (NORVASC) 5 MG tablet Take 1 tablet (5 mg total) by mouth daily. 09/22/17 11/09/20  Duayne CalHoffman, Paul W, NP    Family History Family History  Problem Relation Age of Onset  . Diabetes Mother   . Arthritis Father   . Diabetes Maternal Aunt   . Diabetes Maternal Grandmother     Social History Social History   Tobacco Use  . Smoking status: Former Smoker    Packs/day: 0.10    Years: 5.00    Pack years: 0.50    Types: Cigarettes    Quit date: 11/20/2014    Years since quitting: 6.1  . Smokeless tobacco: Never Used  Vaping Use  . Vaping Use: Never used  Substance Use Topics  . Alcohol use: Not  Currently    Alcohol/week: 0.0 standard drinks  . Drug use: No     Allergies   Hydrocodone and Zithromax [azithromycin]   Review of Systems Review of Systems  Constitutional: Negative for chills and fever.  HENT: Negative for sore throat.   Eyes: Negative for pain and redness.  Respiratory: Negative for shortness of breath.   Cardiovascular: Negative for chest pain.  Gastrointestinal: Negative for abdominal pain, diarrhea, nausea and vomiting.  Genitourinary: Positive for vaginal discharge. Negative for decreased urine volume, difficulty urinating, dysuria, flank pain, frequency, genital sores, hematuria, menstrual problem, pelvic pain, urgency, vaginal bleeding and vaginal pain.  Musculoskeletal: Negative for back pain.  Skin: Positive for wound. Negative for rash.     Physical Exam Triage Vital Signs ED Triage Vitals  Enc Vitals  Group     BP 01/02/21 1554 116/84     Pulse Rate 01/02/21 1554 77     Resp 01/02/21 1554 18     Temp 01/02/21 1554 98.5 F (36.9 C)     Temp Source 01/02/21 1554 Oral     SpO2 01/02/21 1554 97 %     Weight --      Height --      Head Circumference --      Peak Flow --      Pain Score 01/02/21 1550 8     Pain Loc --      Pain Edu? --      Excl. in GC? --    No data found.  Updated Vital Signs BP 116/84 (BP Location: Right Arm)   Pulse 77   Temp 98.5 F (36.9 C) (Oral)   Resp 18   LMP 12/15/2020   SpO2 97%   Breastfeeding Unknown   Visual Acuity Right Eye Distance:   Left Eye Distance:   Bilateral Distance:    Right Eye Near:   Left Eye Near:    Bilateral Near:     Physical Exam Vitals reviewed. Exam conducted with a chaperone present.  Constitutional:      General: She is not in acute distress.    Appearance: Normal appearance. She is not ill-appearing.  HENT:     Head: Normocephalic and atraumatic.  Cardiovascular:     Rate and Rhythm: Normal rate and regular rhythm.     Heart sounds: Normal heart sounds.   Pulmonary:     Effort: Pulmonary effort is normal.     Breath sounds: Normal breath sounds.  Abdominal:     General: Bowel sounds are normal. There is no distension.     Palpations: Abdomen is soft.     Tenderness: There is no abdominal tenderness. There is no right CVA tenderness, left CVA tenderness, guarding or rebound. Negative signs include Murphy's sign and McBurney's sign.  Genitourinary:    General: Normal vulva.     Pubic Area: No rash or pubic lice.      Labia:        Right: No rash, tenderness, lesion or injury.        Left: No rash, tenderness, lesion or injury.      Urethra: No prolapse, urethral pain or urethral swelling.     Vagina: No signs of injury and foreign body. Vaginal discharge present. No erythema, tenderness, bleeding, lesions or prolapsed vaginal walls.     Cervix: No cervical motion tenderness, discharge, friability, lesion, erythema or cervical bleeding.     Uterus: Normal. Not enlarged and not tender.      Adnexa: Right adnexa normal and left adnexa normal.       Right: No mass, tenderness or fullness.         Left: No mass, tenderness or fullness.       Comments: Scant cottage cheese discharge visible vaginal vault Skin:         Comments: 1.5cm area of erythema and swelling, mildly tender to palpation. No discharge expressed, no fluctuance or induration. Appears to be healing well.   Neurological:     General: No focal deficit present.     Mental Status: She is alert and oriented to person, place, and time.  Psychiatric:        Mood and Affect: Mood normal.        Behavior: Behavior normal.  Thought Content: Thought content normal.        Judgment: Judgment normal.      UC Treatments / Results  Labs (all labs ordered are listed, but only abnormal results are displayed) Labs Reviewed - No data to display  EKG   Radiology No results found.  Procedures Procedures (including critical care time)  Medications Ordered in  UC Medications - No data to display  Initial Impression / Assessment and Plan / UC Course  I have reviewed the triage vital signs and the nursing notes.  Pertinent labs & imaging results that were available during my care of the patient were reviewed by me and considered in my medical decision making (see chart for details).     For cellulitis and abscess, doxycycline as below. Abscess appears to be healing well on its own.  For vaginal candida, diflucan as below. Will send for G/C, trich, yeast, BV testing. She denies STI risk, declines HIV, RPR. Abstain from intercourse for 7 days.   Today she is afebrile nontachycardic. Not pregnant or breastfeeding.   Spent over 40 minutes obtaining H&P, performing physical, discussing results, treatment plan and plan for follow-up with patient. Patient agrees with plan.   This chart was dictated using voice recognition software, Dragon. Despite the best efforts of this provider to proofread and correct errors, errors may still occur which can change documentation meaning.   Final Clinical Impressions(s) / UC Diagnoses   Final diagnoses:  Cellulitis and abscess of trunk  Vaginal discharge  Candidiasis, vagina     Discharge Instructions     -    ED Prescriptions    Medication Sig Dispense Auth. Provider   doxycycline (VIBRAMYCIN) 100 MG capsule Take 1 capsule (100 mg total) by mouth 2 (two) times daily for 7 days. 14 capsule Rhys Martini, PA-C   fluconazole (DIFLUCAN) 150 MG tablet Take 1 tablet (150 mg total) by mouth daily. Take one pill today. Take the second pill if symptoms worsen or persist on day 3. 2 tablet Rhys Martini, PA-C     PDMP not reviewed this encounter.   Rhys Martini, PA-C 01/02/21 1617

## 2021-01-02 NOTE — ED Triage Notes (Signed)
Pt c/o "boil" abscess to right groin for approx 2 weeks. States it has drained a mixture of blood and pus.  Denies fever, n/v.  Has been applying warm compresses Area of edema, erythema to lower right suprapubic area noted just above groin line.

## 2021-01-04 LAB — CERVICOVAGINAL ANCILLARY ONLY
Bacterial Vaginitis (gardnerella): POSITIVE — AB
Candida Glabrata: NEGATIVE
Candida Vaginitis: POSITIVE — AB
Chlamydia: NEGATIVE
Comment: NEGATIVE
Comment: NEGATIVE
Comment: NEGATIVE
Comment: NEGATIVE
Comment: NEGATIVE
Comment: NORMAL
Neisseria Gonorrhea: NEGATIVE
Trichomonas: NEGATIVE

## 2021-01-06 ENCOUNTER — Encounter: Payer: Medicare Other | Admitting: Nurse Practitioner

## 2021-01-18 ENCOUNTER — Other Ambulatory Visit: Payer: Self-pay | Admitting: Nurse Practitioner

## 2021-01-18 DIAGNOSIS — B3731 Acute candidiasis of vulva and vagina: Secondary | ICD-10-CM

## 2021-01-18 DIAGNOSIS — B373 Candidiasis of vulva and vagina: Secondary | ICD-10-CM

## 2021-01-18 DIAGNOSIS — B9689 Other specified bacterial agents as the cause of diseases classified elsewhere: Secondary | ICD-10-CM

## 2021-01-18 MED ORDER — METRONIDAZOLE 500 MG PO TABS: 500.0000 mg | ORAL_TABLET | Freq: Two times a day (BID) | ORAL | 0 refills | Status: AC

## 2021-01-18 MED ORDER — FLUCONAZOLE 150 MG PO TABS: 150.0000 mg | ORAL_TABLET | Freq: Once | ORAL | 0 refills | Status: AC

## 2021-02-15 ENCOUNTER — Other Ambulatory Visit: Payer: Self-pay

## 2021-02-15 ENCOUNTER — Encounter: Payer: Self-pay | Admitting: Nurse Practitioner

## 2021-02-15 ENCOUNTER — Ambulatory Visit (INDEPENDENT_AMBULATORY_CARE_PROVIDER_SITE_OTHER): Payer: Medicare Other | Admitting: Nurse Practitioner

## 2021-02-15 VITALS — BP 102/62 | HR 74 | Temp 98.3°F | Resp 16 | Wt 178.0 lb

## 2021-02-15 DIAGNOSIS — R350 Frequency of micturition: Secondary | ICD-10-CM

## 2021-02-15 DIAGNOSIS — N898 Other specified noninflammatory disorders of vagina: Secondary | ICD-10-CM

## 2021-02-15 DIAGNOSIS — B9689 Other specified bacterial agents as the cause of diseases classified elsewhere: Secondary | ICD-10-CM | POA: Diagnosis not present

## 2021-02-15 DIAGNOSIS — N760 Acute vaginitis: Secondary | ICD-10-CM | POA: Diagnosis not present

## 2021-02-15 LAB — WET PREP FOR TRICH, YEAST, CLUE

## 2021-02-15 MED ORDER — FLUCONAZOLE 150 MG PO TABS: 150.0000 mg | ORAL_TABLET | Freq: Once | ORAL | 0 refills | Status: AC

## 2021-02-15 MED ORDER — METRONIDAZOLE 0.75 % VA GEL: VAGINAL | 0 refills | Status: DC

## 2021-02-15 NOTE — Patient Instructions (Signed)
Bacterial Vaginosis  Bacterial vaginosis is an infection that occurs when the normal balance of bacteria in the vagina changes. This change is caused by an overgrowth of certain bacteria in the vagina. Bacterial vaginosis is the most common vaginal infection among females aged 31 to 44 years. This condition increases the risk of sexually transmitted infections (STIs). Treatment can help reduce this risk. Treatment is very important for pregnant women because this condition can cause babies to be born early (prematurely) or at a low birth weight. What are the causes? This condition is caused by an increase in harmful bacteria that are normally present in small amounts in the vagina. However, the exact reason this condition develops is not known. You cannot get bacterial vaginosis from toilet seats, bedding, swimming pools, or contact with objects around you. What increases the risk? The following factors may make you more likely to develop this condition:  Having a new sexual partner or multiple sexual partners, or having unprotected sex.  Douching.  Having an intrauterine device (IUD).  Smoking.  Abusing drugs and alcohol. This may lead to riskier sexual behavior.  Taking certain antibiotic medicines.  Being pregnant. What are the signs or symptoms? Some women with this condition have no symptoms. Symptoms may include:  Gray or white vaginal discharge. The discharge can be watery or foamy.  A fish-like odor with discharge, especially after sex or during menstruation.  Itching in and around the vagina.  Burning or pain with urination. How is this diagnosed? This condition is diagnosed based on:  Your medical history.  A physical exam of the vagina.  Checking a sample of vaginal fluid for harmful bacteria or abnormal cells. How is this treated? This condition is treated with antibiotic medicines. These may be given as a pill, a vaginal cream, or a medicine that is put into the  vagina (suppository). If the condition comes back after treatment, a second round of antibiotics may be needed. Follow these instructions at home: Medicines  Take or apply over-the-counter and prescription medicines only as told by your health care provider.  Take or apply your antibiotic medicine as told by your health care provider. Do not stop using the antibiotic even if you start to feel better. General instructions  If you have a female sexual partner, tell her that you have a vaginal infection. She should follow up with her health care provider. If you have a female sexual partner, he does not need treatment.  Avoid sexual activity until you finish treatment.  Drink enough fluid to keep your urine pale yellow.  Keep the area around your vagina and rectum clean. ? Wash the area daily with warm water. ? Wipe yourself from front to back after using the toilet.  If you are breastfeeding, talk to your health care provider about continuing breastfeeding during treatment.  Keep all follow-up visits. This is important. How is this prevented? Self-care  Do not douche.  Wash the outside of your vagina with warm water only.  Wear cotton or cotton-lined underwear.  Avoid wearing tight pants and pantyhose, especially during the summer. Safe sex  Use protection when having sex. This includes: ? Using condoms. ? Using dental dams. This is a thin layer of a material made of latex or polyurethane that protects the mouth during oral sex.  Limit the number of sexual partners. To help prevent bacterial vaginosis, it is best to have sex with just one partner (monogamous relationship).  Make sure you and your sexual partner   are tested for STIs. Drugs and alcohol  Do not use any products that contain nicotine or tobacco. These products include cigarettes, chewing tobacco, and vaping devices, such as e-cigarettes. If you need help quitting, ask your health care provider.  Do not use  drugs.  Do not drink alcohol if: ? Your health care provider tells you not to do this. ? You are pregnant, may be pregnant, or are planning to become pregnant.  If you drink alcohol: ? Limit how much you have to 0-1 drink a day. ? Be aware of how much alcohol is in your drink. In the [place] one drink equals one 12 oz bottle of beer (355 mL), one 5 oz glass of wine (148 mL), or one 1 oz glass of hard liquor (44 mL). Where to find more information  Centers for Disease Control and Prevention: www.cdc.gov  American Sexual Health Association (ASHA): www.ashastd.org  [place] Department of Health and Human Services, Office on Women's Health: www.womenshealth.gov Contact a health care provider if:  Your symptoms do not improve, even after treatment.  You have more discharge or pain when urinating.  You have a fever or chills.  You have pain in your abdomen or pelvis.  You have pain during sex.  You have vaginal bleeding between menstrual periods. Summary  Bacterial vaginosis is a vaginal infection that occurs when the normal balance of bacteria in the vagina changes. It results from an overgrowth of certain bacteria.  This condition increases the risk of sexually transmitted infections (STIs). Getting treated can help reduce this risk.  Treatment is very important for pregnant women because this condition can cause babies to be born early (prematurely) or at low birth weight.  This condition is treated with antibiotic medicines. These may be given as a pill, a vaginal cream, or a medicine that is put into the vagina (suppository). This information is not intended to replace advice given to you by your health care provider. Make sure you discuss any questions you have with your health care provider. Document Revised: 05/07/2020 Document Reviewed: 05/07/2020 Elsevier Patient Education  2021 Elsevier Inc.  

## 2021-02-15 NOTE — Progress Notes (Signed)
GYNECOLOGY  VISIT  CC:   Vaginal irritation and itching x 3 days  HPI: 30 y.o. [id number] Single Black or African American female here for urinary frequency, cramping, vaginal itching/irritation, vaginal discharge.She is not sexually active, is not concerned about STD.  Lost pregnancy in January around 7-[redacted] weeks gestation  Micah Flesher to Rheumatology in [place] today.   Previously on Bactrim to prevent UTI but she has been off it because her doctor told her that her levels looked good. When she told him about symptoms today she was advised  to make appointment for evaluation of vaginal/urinary symptoms.   Is prove to BV and yeast  Period Duration (Days): 3 Period Pattern: Regular Menstrual Flow:  (random flow) Menstrual Control: Maxi pad Dysmenorrhea: (!) Moderate Dysmenorrhea Symptoms: Cramping  GYNECOLOGIC HISTORY: Patient's last menstrual period was 02/10/2021 (exact date). Contraception: abstinence Menopausal hormone therapy: none  Patient Active Problem List   Diagnosis Date Noted  . Gastroesophageal reflux disease without esophagitis 07/13/2020  . ILD (interstitial lung disease) (HCC)   . History of IUFD 02/14/2017  . 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 Chronic hypertension 03/14/2016  . 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 Lupus (systemic lupus erythematosus) (HCC) 08/21/2015  . Loud P2 (pulmonary S2, second heart sound) 08/21/2015  . Insomnia 08/21/2015  . Systemic lupus erythematosus (SLE) affecting pregnancy, antepartum (HCC) 07/06/2015  . Sjogren's syndrome (HCC) 04/28/2015    Past Medical History:  Diagnosis Date  . Acute respiratory failure with hypoxemia (HCC) 09/19/2017  . Acute respiratory failure with hypoxia (HCC)   . Arthritis    "hands and legs" (01/08/2015)  . CAP (community acquired pneumonia) 01/07/2015  . Chronic Respiratory failure with oxygen requirement affecting pregnancy, antepartum 05/16/2016    Resolved pulmonary HTN on 2018 echo (in our epic).   Pt off O2 in 2018.  . Daily headache    "sometimes" (01/08/2015)  . GERD (gastroesophageal reflux disease)   . Hypertension    No longer takes meds  . Lung disease   . Lupus (HCC)   . Multifocal pneumonia   . Pulmonary hypertension (HCC)   . Sjogren's syndrome [facility])     Past Surgical History:  Procedure Laterality Date  . CARDIAC CATHETERIZATION N/A 09/09/2015   Procedure: Right Heart Cath;  Surgeon: Laurey Morale, MD;  Location: [facility] INVASIVE CV LAB;  Service: Cardiovascular;  Laterality: N/A;  . DILATION AND EVACUATION N/A 07/13/2015   Procedure: DILATATION AND EVACUATION;  Surgeon: Levie Heritage, DO;  Location: [facility] ORS;  Service: Gynecology;  Laterality: N/A;  . FINGER SURGERY Right 03/2014   "laceration, nerve/artery injury" 2nd digit  . VIDEO BRONCHOSCOPY Bilateral 01/12/2015   Procedure: VIDEO BRONCHOSCOPY WITH FLUORO;  Surgeon: Leslye Peer, MD;  Location: [facility] ENDOSCOPY;  Service: Cardiopulmonary;  Laterality: Bilateral;    MEDS:   Current Outpatient Medications on File Prior to Visit  Medication Sig Dispense Refill  . albuterol (PROVENTIL HFA;VENTOLIN HFA) 108 (90 Base) MCG/ACT inhaler Inhale 2 puffs into the lungs every 4 (four) hours as needed for wheezing or shortness of breath. 1 Inhaler 5  . azaTHIOprine (IMURAN) 50 MG tablet Take by mouth.    . butalbital-acetaminophen-caffeine (FIORICET) 50-325-40 MG tablet Take 1-2 tablets by mouth every 6 (six) hours as needed for headache. 60 tablet 0  . cyclobenzaprine (FLEXERIL) 10 MG tablet as needed.    . hydroxychloroquine (PLAQUENIL) 200 MG tablet Take 400 mg by mouth daily.    Marland Kitchen omeprazole (  PRILOSEC) 20 MG capsule Take by mouth.    . predniSONE (DELTASONE) 5 MG tablet Take 5 mg by mouth daily with breakfast.    . sertraline (ZOLOFT) 50 MG tablet Take by mouth.    . oxyCODONE-acetaminophen (PERCOCET) 10-325 MG tablet Take 1 tablet by mouth 3 (three) times daily as  needed.    . [DISCONTINUED] amLODipine (NORVASC) 5 MG tablet Take 1 tablet (5 mg total) by mouth daily.     No current facility-administered medications on file prior to visit.    ALLERGIES: Hydrocodone and Zithromax [azithromycin]  Family History  Problem Relation Age of Onset  . Diabetes Mother   . Arthritis Father   . Diabetes Maternal Aunt   . Diabetes Maternal Grandmother      Review of Systems  PHYSICAL EXAMINATION:    BP 102/62   Pulse 74   Temp 98.3 F (36.8 C) (Oral)   Resp 16   Wt 178 lb (80.7 kg)   LMP 02/10/2021 (Exact Date)   BMI 31.53 kg/m     General appearance: alert, cooperative, no acute distress  Lymph:  no inguinal LAD noted  Pelvic: External genitalia:  no lesions              Urethra:  normal appearing urethra with no masses, tenderness or lesions              Bartholins and Skenes: normal                 Vagina: normal appearing vagina, white discharge, ph 4.5               Cervix: no cervical motion tenderness and no lesions              Bimanual Exam:  Uterus:  normal size, contour, position, consistency, mobility, non-tender              Adnexa: no mass, fullness, tenderness     UA: NEG Blood, NEG Leukocytes, NEG-nitrites, NEG- Protein, SG > 1.030 Microscopy: 0-5 WBC, 6-10 epithelial, moderate bacteria and mucus  Wet mount: + Clue, + whiff, neg trich, neg yeast             Chaperone, Joy, CMA, was present for exam.  Assessment/Plan: Urinary frequency - Plan: Urinalysis,Complete w/RFL Culture  Vaginal irritation - Plan: WET PREP FOR TRICH, YEAST, CLUE, fluconazole (DIFLUCAN) 150 MG tablet  BV (bacterial vaginosis) - Plan: metroNIDAZOLE (METROGEL) 0.75 % vaginal gel  Pt states that always gets yeast when takes antibiotics and asked for yeast treatment. Counseled not to take Diflucan unless becomes symptomatic.  F/U prn, Annual exam due 10/2021

## 2021-02-17 LAB — URINE CULTURE
MICRO NUMBER:: 11699530
Result:: NO GROWTH
SPECIMEN QUALITY:: ADEQUATE

## 2021-02-17 LAB — URINALYSIS, COMPLETE W/RFL CULTURE
Bilirubin Urine: NEGATIVE
Glucose, UA: NEGATIVE
Hgb urine dipstick: NEGATIVE
Hyaline Cast: NONE SEEN /LPF
Ketones, ur: NEGATIVE
Leukocyte Esterase: NEGATIVE
Nitrites, Initial: NEGATIVE
Protein, ur: NEGATIVE
RBC / HPF: NONE SEEN /HPF (ref 0–2)
Specific Gravity, Urine: 1.025 (ref 1.001–1.03)
pH: 5.5 (ref 5.0–8.0)

## 2021-02-17 LAB — CULTURE INDICATED

## 2021-02-22 ENCOUNTER — Emergency Department (HOSPITAL_COMMUNITY): Payer: Medicare Other

## 2021-02-22 ENCOUNTER — Encounter (HOSPITAL_COMMUNITY): Payer: Self-pay | Admitting: Emergency Medicine

## 2021-02-22 ENCOUNTER — Emergency Department (HOSPITAL_COMMUNITY)
Admission: EM | Admit: 2021-02-22 | Discharge: 2021-02-22 | Disposition: A | Discharge status: Home / Self Care | Payer: Medicare Other | Attending: Emergency Medicine | Admitting: Emergency Medicine

## 2021-02-22 DIAGNOSIS — Z87891 Personal history of nicotine dependence: Secondary | ICD-10-CM | POA: Diagnosis not present

## 2021-02-22 DIAGNOSIS — S29012A Strain of muscle and tendon of back wall of thorax, initial encounter: Secondary | ICD-10-CM | POA: Insufficient documentation

## 2021-02-22 DIAGNOSIS — I10 Essential (primary) hypertension: Secondary | ICD-10-CM | POA: Diagnosis not present

## 2021-02-22 DIAGNOSIS — Z79899 Other long term (current) drug therapy: Secondary | ICD-10-CM | POA: Diagnosis not present

## 2021-02-22 DIAGNOSIS — S161XXA Strain of muscle, fascia and tendon at neck level, initial encounter: Secondary | ICD-10-CM | POA: Diagnosis not present

## 2021-02-22 DIAGNOSIS — Y92410 Unspecified street and highway as the place of occurrence of the external cause: Secondary | ICD-10-CM | POA: Insufficient documentation

## 2021-02-22 DIAGNOSIS — T148XXA Other injury of unspecified body region, initial encounter: Secondary | ICD-10-CM

## 2021-02-22 DIAGNOSIS — S199XXA Unspecified injury of neck, initial encounter: Secondary | ICD-10-CM | POA: Diagnosis present

## 2021-02-22 LAB — I-STAT BETA HCG BLOOD, ED (MC, WL, AP ONLY): I-stat hCG, quantitative: <5 m[IU]/mL (ref <5)

## 2021-02-22 MED ORDER — KETOROLAC TROMETHAMINE 60 MG/2ML IM SOLN
60.0000 mg | Freq: Once | INTRAMUSCULAR | Status: AC
  Administered 2021-02-22: 60 mg via INTRAMUSCULAR
  Filled 2021-02-22: qty 2

## 2021-02-22 MED ORDER — NAPROXEN 500 MG PO TABS: 500.0000 mg | ORAL_TABLET | Freq: Two times a day (BID) | ORAL | 0 refills | Status: DC

## 2021-02-22 MED ORDER — METHOCARBAMOL 500 MG PO TABS: 500.0000 mg | ORAL_TABLET | Freq: Two times a day (BID) | ORAL | 0 refills | Status: DC | PRN
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
## 2021-02-22 NOTE — Discharge Instructions (Addendum)
Your x-rays do not show any signs of broken bones.  Everything else looks okay, you will likely have pain for the next week or 2, this should be better that timeframe, oftentimes pain like this can last a long time so please have your family doctor look at physical therapy if the pain continues.  Ice packs, warm packs, some people prefer 1 or the other.  Please take Naprosyn, 500mg  by mouth twice daily as needed for pain - this in an antiinflammatory medicine (NSAID) and is similar to ibuprofen - many people feel that it is stronger than ibuprofen and it is easier to take since it is a smaller pill.  Please use this only for 1 week - if your pain persists, you will need to follow up with your doctor in the office for ongoing guidance and pain control.    Robaxin twice daily as a muscle relaxer as needed - do not drive with this

## 2021-03-25 ENCOUNTER — Ambulatory Visit (INDEPENDENT_AMBULATORY_CARE_PROVIDER_SITE_OTHER): Payer: Medicare Other | Admitting: Nurse Practitioner

## 2021-03-25 ENCOUNTER — Encounter: Payer: Self-pay | Admitting: Nurse Practitioner

## 2021-03-25 ENCOUNTER — Other Ambulatory Visit: Payer: Self-pay

## 2021-03-25 VITALS — BP 102/70 | HR 85 | Resp 16 | Wt 178.0 lb

## 2021-03-25 DIAGNOSIS — N898 Other specified noninflammatory disorders of vagina: Secondary | ICD-10-CM | POA: Diagnosis not present

## 2021-03-25 DIAGNOSIS — Z113 Encounter for screening for infections with a predominantly sexual mode of transmission: Secondary | ICD-10-CM | POA: Diagnosis not present

## 2021-03-25 DIAGNOSIS — R102 Pelvic and perineal pain: Secondary | ICD-10-CM | POA: Diagnosis not present

## 2021-03-25 LAB — URINALYSIS, COMPLETE W/RFL CULTURE
Bacteria, UA: NONE SEEN /HPF
Bilirubin Urine: NEGATIVE
Glucose, UA: NEGATIVE
Hgb urine dipstick: NEGATIVE
Hyaline Cast: NONE SEEN /LPF
Ketones, ur: NEGATIVE
Leukocyte Esterase: NEGATIVE
Nitrites, Initial: NEGATIVE
Protein, ur: NEGATIVE
RBC / HPF: NONE SEEN /HPF (ref 0–2)
Specific Gravity, Urine: 1.015 (ref 1.001–1.035)
WBC, UA: NONE SEEN /HPF (ref 0–5)
pH: 7 (ref 5.0–8.0)

## 2021-03-25 LAB — WET PREP FOR TRICH, YEAST, CLUE

## 2021-03-25 LAB — NO CULTURE INDICATED

## 2021-03-25 MED ORDER — FLUCONAZOLE 150 MG PO TABS: ORAL_TABLET | ORAL | 0 refills | Status: DC

## 2021-03-25 NOTE — Progress Notes (Signed)
GYNECOLOGY  VISIT  CC:   Vaginal odor, itching and increased discharge  HPI: 30 y.o. [id number] Single Black or African American female here for vaginitis. Treated for BV and yeast 02/15/2021, and 2/28 feels like it got a little better. Prone to yeast and BV  Last time was sexually active was 3 months ago, on/off partner x 6 years  Period Duration (Days): 3-4 Period Pattern: Regular Menstrual Flow:  (light to moderate) Menstrual Control: Maxi pad Dysmenorrhea: (!) Mild Dysmenorrhea Symptoms: Cramping,Headache  GYNECOLOGIC HISTORY: Patient's last menstrual period was 03/05/2021 (exact date). Contraception: abstinence Menopausal hormone therapy: none  Patient Active Problem List   Diagnosis Date Noted  . Gastroesophageal reflux disease without esophagitis 07/13/2020  . ILD (interstitial lung disease) (HCC)   . History of IUFD 02/14/2017  . 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 Chronic hypertension 03/14/2016  . 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 Lupus (systemic lupus erythematosus) (HCC) 08/21/2015  . Loud P2 (pulmonary S2, second heart sound) 08/21/2015  . Insomnia 08/21/2015  . Systemic lupus erythematosus (SLE) affecting pregnancy, antepartum (HCC) 07/06/2015  . Sjogren's syndrome (HCC) 04/28/2015    Past Medical History:  Diagnosis Date  . Acute respiratory failure with hypoxemia (HCC) 09/19/2017  . Acute respiratory failure with hypoxia (HCC)   . Arthritis    "hands and legs" (01/08/2015)  . CAP (community acquired pneumonia) 01/07/2015  . Chronic Respiratory failure with oxygen requirement affecting pregnancy, antepartum 05/16/2016   Resolved pulmonary HTN on 2018 echo (in our epic).   Pt off O2 in 2018.  . Daily headache    "sometimes" (01/08/2015)  . GERD (gastroesophageal reflux disease)   . Hypertension    No longer takes meds  . Lung disease   . Lupus (HCC)   . Multifocal pneumonia   . Pulmonary  hypertension (HCC)   . Sjogren's syndrome [facility])     Past Surgical History:  Procedure Laterality Date  . CARDIAC CATHETERIZATION N/A 09/09/2015   Procedure: Right Heart Cath;  Surgeon: Laurey Morale, MD;  Location: [facility] INVASIVE CV LAB;  Service: Cardiovascular;  Laterality: N/A;  . DILATION AND EVACUATION N/A 07/13/2015   Procedure: DILATATION AND EVACUATION;  Surgeon: Levie Heritage, DO;  Location: [facility] ORS;  Service: Gynecology;  Laterality: N/A;  . FINGER SURGERY Right 03/2014   "laceration, nerve/artery injury" 2nd digit  . VIDEO BRONCHOSCOPY Bilateral 01/12/2015   Procedure: VIDEO BRONCHOSCOPY WITH FLUORO;  Surgeon: Leslye Peer, MD;  Location: [facility] ENDOSCOPY;  Service: Cardiopulmonary;  Laterality: Bilateral;    MEDS:   Current Outpatient Medications on File Prior to Visit  Medication Sig Dispense Refill  . albuterol (PROVENTIL HFA;VENTOLIN HFA) 108 (90 Base) MCG/ACT inhaler Inhale 2 puffs into the lungs every 4 (four) hours as needed for wheezing or shortness of breath. 1 Inhaler 5  . azaTHIOprine (IMURAN) 50 MG tablet Take by mouth.    . butalbital-acetaminophen-caffeine (FIORICET) 50-325-40 MG tablet Take 1-2 tablets by mouth every 6 (six) hours as needed for headache. 60 tablet 0  . cyclobenzaprine (FLEXERIL) 10 MG tablet as needed.    . hydroxychloroquine (PLAQUENIL) 200 MG tablet Take 400 mg by mouth daily.    . naproxen (NAPROSYN) 500 MG tablet Take 1 tablet (500 mg total) by mouth 2 (two) times daily. 30 tablet 0  . oxyCODONE-acetaminophen (PERCOCET) 10-325 MG tablet Take 1 tablet by mouth 3 (three) times daily as needed.    . predniSONE (DELTASONE) 5 MG tablet Take  5 mg by mouth daily with breakfast.    . sertraline (ZOLOFT) 50 MG tablet Take by mouth.    . [DISCONTINUED] amLODipine (NORVASC) 5 MG tablet Take 1 tablet (5 mg total) by mouth daily.     No current facility-administered medications on file prior to visit.    ALLERGIES: Hydrocodone and Zithromax  [azithromycin]  Family History  Problem Relation Age of Onset  . Diabetes Mother   . Arthritis Father   . Diabetes Maternal Aunt   . Diabetes Maternal Grandmother      Review of Systems  Constitutional: Negative.   HENT: Negative.   Eyes: Negative.   Respiratory: Negative.   Cardiovascular: Negative.   Gastrointestinal: Positive for constipation.  Endocrine: Negative.   Genitourinary: Positive for vaginal discharge.       Vaginal odor & itching  Musculoskeletal: Negative.   Skin: Negative.   Allergic/Immunologic: Negative.   Neurological: Negative.   Psychiatric/Behavioral: Negative.     PHYSICAL EXAMINATION:    BP 102/70   Pulse 85   Resp 16   Wt 178 lb (80.7 kg)   LMP 03/05/2021 (Exact Date)   BMI 31.53 kg/m     General appearance: alert, cooperative, no acute distress  Lymph:  no inguinal LAD noted  Pelvic: External genitalia:  no lesions              Urethra:  normal appearing urethra with no masses, tenderness or lesions              Bartholins and Skenes: normal                 Vagina: normal appearing vagina (minimal redness or swelling), vaginal discharge is thick, chunky white, ph 4.0              Cervix: no cervical motion tenderness and no lesions              Bimanual Exam:  Uterus:  normal size, contour, position, consistency, mobility, non-tender              Adnexa: no mass, fullness, tenderness   Wet mount WNL Urinalysis: WNL            Chaperone, Joy, CMA, was present for exam.  Assessment/Plan: Vaginal irritation - Plan: WET PREP FOR TRICH, YEAST, CLUE, C. trachomatis/N. gonorrhoeae RNA, fluconazole (DIFLUCAN) 150 MG tablet Although yeast not identified on lab specimen, physical exam was clinically suggestive of yeast. Will treat presumptively.  Pelvic pain - Plan: Urinalysis,Complete w/RFL Culture, C. trachomatis/N. gonorrhoeae RNA If pelvic pain is not resolved (and urine culture and STD testing negative) will recommend [place]  Screen for  sexually transmitted diseases - Plan: C. trachomatis/N. gonorrhoeae RNA  To to call office if pelvic pain persist. Annual exam due 10/2021

## 2021-03-25 NOTE — Patient Instructions (Signed)
Will await test results, if every thing is normal and pelvic pain persists, I will recommend an Ultrasound

## 2021-03-26 LAB — C. TRACHOMATIS/N. GONORRHOEAE RNA
C. trachomatis RNA, TMA: NOT DETECTED
N. gonorrhoeae RNA, TMA: NOT DETECTED

## 2021-04-08 ENCOUNTER — Ambulatory Visit (HOSPITAL_COMMUNITY)
Admission: EM | Admit: 2021-04-08 | Discharge: 2021-04-08 | Disposition: A | Discharge status: Home / Self Care | Payer: Medicare Other | Attending: Internal Medicine | Admitting: Internal Medicine

## 2021-04-08 ENCOUNTER — Encounter (HOSPITAL_COMMUNITY): Payer: Self-pay

## 2021-04-08 ENCOUNTER — Other Ambulatory Visit: Payer: Self-pay

## 2021-04-08 DIAGNOSIS — L0291 Cutaneous abscess, unspecified: Secondary | ICD-10-CM

## 2021-04-08 DIAGNOSIS — L02214 Cutaneous abscess of groin: Secondary | ICD-10-CM

## 2021-04-08 MED ORDER — FLUCONAZOLE 150 MG PO TABS: 150.0000 mg | ORAL_TABLET | Freq: Every day | ORAL | 0 refills | Status: DC

## 2021-04-08 MED ORDER — DOXYCYCLINE MONOHYDRATE 100 MG PO TABS: 100.0000 mg | ORAL_TABLET | Freq: Two times a day (BID) | ORAL | 0 refills | Status: AC

## 2021-04-08 MED ORDER — CEPHALEXIN 500 MG PO CAPS: 500.0000 mg | ORAL_CAPSULE | Freq: Four times a day (QID) | ORAL | 0 refills | Status: DC

## 2021-04-08 NOTE — ED Triage Notes (Signed)
Pt reports having 2 boils in the groin area x 1 week. States had some white drainage 1 day ago. Denies fever, chills.

## 2021-04-08 NOTE — ED Provider Notes (Signed)
[facility]    CSN: [id number] Arrival date & time: 04/08/21  0857      History   Chief Complaint Chief Complaint  Patient presents with  . Abscess    HPI Kayla [name] is a 30 y.o. female presents to urgent care today with ports of 2 boils to groin region x1 week.  Patient states area on suprapubic region already began draining.  She soaks last evening and Epson salt bath with some relief.  Denies any recent fever or chills  Past Medical History:  Diagnosis Date  . Acute respiratory failure with hypoxemia (HCC) 09/19/2017  . Acute respiratory failure with hypoxia (HCC)   . Arthritis    "hands and legs" (01/08/2015)  . CAP (community acquired pneumonia) 01/07/2015  . Chronic Respiratory failure with oxygen requirement affecting pregnancy, antepartum 05/16/2016   Resolved pulmonary HTN on 2018 echo (in our epic).   Pt off O2 in 2018.  . Daily headache    "sometimes" (01/08/2015)  . GERD (gastroesophageal reflux disease)   . Hypertension    No longer takes meds  . Lung disease   . Lupus (HCC)   . Multifocal pneumonia   . Pulmonary hypertension (HCC)   . Sjogren's syndrome [facility])     Patient Active Problem List   Diagnosis Date Noted  . Gastroesophageal reflux disease without esophagitis 07/13/2020  . ILD (interstitial lung disease) (HCC)   . History of IUFD 02/14/2017  . 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 Chronic hypertension 03/14/2016  . 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 Lupus (systemic lupus erythematosus) (HCC) 08/21/2015  . Loud P2 (pulmonary S2, second heart sound) 08/21/2015  . Insomnia 08/21/2015  . Systemic lupus erythematosus (SLE) affecting pregnancy, antepartum (HCC) 07/06/2015  . Sjogren's syndrome (HCC) 04/28/2015    Past Surgical History:  Procedure Laterality Date  . CARDIAC CATHETERIZATION N/A 09/09/2015   Procedure: Right Heart Cath;  Surgeon: Laurey Morale, MD;   Location: [facility] INVASIVE CV LAB;  Service: Cardiovascular;  Laterality: N/A;  . DILATION AND EVACUATION N/A 07/13/2015   Procedure: DILATATION AND EVACUATION;  Surgeon: Levie Heritage, DO;  Location: [facility] ORS;  Service: Gynecology;  Laterality: N/A;  . FINGER SURGERY Right 03/2014   "laceration, nerve/artery injury" 2nd digit  . VIDEO BRONCHOSCOPY Bilateral 01/12/2015   Procedure: VIDEO BRONCHOSCOPY WITH FLUORO;  Surgeon: Leslye Peer, MD;  Location: [facility] ENDOSCOPY;  Service: Cardiopulmonary;  Laterality: Bilateral;    OB History    Gravida  4   Para  1   Term  1   Preterm  0   AB  2   Living  1     SAB  2   IAB  0   Ectopic  0   Multiple  0   Live Births  1            Home Medications    Prior to Admission medications   Medication Sig Start Date End Date Taking? Authorizing Provider  doxycycline (ADOXA) 100 MG tablet Take 1 tablet (100 mg total) by mouth 2 (two) times daily for 5 days. 04/08/21 04/13/21 Yes Rolla Etienne, NP  fluconazole (DIFLUCAN) 150 MG tablet Take 1 tablet (150 mg total) by mouth daily. May repeat x1 in 3 days if symptoms persist 04/08/21  Yes Rolla Etienne, NP  albuterol (PROVENTIL HFA;VENTOLIN HFA) 108 (90 Base) MCG/ACT inhaler Inhale 2 puffs into the lungs every 4 (four) hours as needed  for wheezing or shortness of breath. 06/09/17   Adam Phenix, MD  azaTHIOprine (IMURAN) 50 MG tablet Take by mouth. 11/26/20   [provider]  butalbital-acetaminophen-caffeine (FIORICET) 50-325-40 MG tablet Take 1-2 tablets by mouth every 6 (six) hours as needed for headache. 11/18/20   Raelyn Mora, CNM  cyclobenzaprine (FLEXERIL) 10 MG tablet as needed. 08/10/18   [provider]  hydroxychloroquine (PLAQUENIL) 200 MG tablet Take 400 mg by mouth daily.    [provider]  naproxen (NAPROSYN) 500 MG tablet Take 1 tablet (500 mg total) by mouth 2 (two) times daily. 02/22/21   Eber Hong, MD  oxyCODONE-acetaminophen (PERCOCET) 10-325 MG  tablet Take 1 tablet by mouth 3 (three) times daily as needed. 12/28/20   [provider]  predniSONE (DELTASONE) 5 MG tablet Take 5 mg by mouth daily with breakfast.    [provider]  sertraline (ZOLOFT) 50 MG tablet Take by mouth. 02/15/21 02/15/22  [provider]  amLODipine (NORVASC) 5 MG tablet Take 1 tablet (5 mg total) by mouth daily. 09/22/17 11/09/20  Duayne Cal, NP    Family History Family History  Problem Relation Age of Onset  . Diabetes Mother   . Arthritis Father   . Diabetes Maternal Aunt   . Diabetes Maternal Grandmother     Social History Social History   Tobacco Use  . Smoking status: Former Smoker    Packs/day: 0.10    Years: 5.00    Pack years: 0.50    Types: Cigarettes    Quit date: 11/20/2014    Years since quitting: 6.3  . Smokeless tobacco: Never Used  Vaping Use  . Vaping Use: Never used  Substance Use Topics  . Alcohol use: Not Currently    Alcohol/week: 0.0 standard drinks  . Drug use: No     Allergies   Hydrocodone and Zithromax [azithromycin]   Review of Systems In HPI otherwise negative   Physical Exam Triage Vital Signs ED Triage Vitals  Enc Vitals Group     BP 04/08/21 0948 126/89     Pulse Rate 04/08/21 0948 75     Resp 04/08/21 0948 17     Temp 04/08/21 0948 98.7 F (37.1 C)     Temp Source 04/08/21 0948 Oral     SpO2 04/08/21 0948 98 %     Weight --      Height --      Head Circumference --      Peak Flow --      Pain Score 04/08/21 0945 9     Pain Loc --      Pain Edu? --      Excl. in GC? --    No data found.  Updated Vital Signs BP 126/89 (BP Location: Left Arm)   Pulse 75   Temp 98.7 F (37.1 C) (Oral)   Resp 17   LMP 04/02/2021 (Exact Date)   SpO2 98%   Breastfeeding No   Visual Acuity Right Eye Distance:   Left Eye Distance:   Bilateral Distance:    Right Eye Near:   Left Eye Near:    Bilateral Near:     Physical Exam Constitutional:      General: She is not in  acute distress.    Appearance: Normal appearance. She is not ill-appearing.  Genitourinary:      Comments: Area tender to palpation and hardening.  No fluctuance, no drainage. Skin:    General: Skin is warm and  dry.  Neurological:     Mental Status: She is alert.      UC Treatments / Results  Labs (all labs ordered are listed, but only abnormal results are displayed) Labs Reviewed - No data to display  EKG   Radiology No results found.  Procedures Procedures (including critical care time)  Medications Ordered in UC Medications - No data to display  Initial Impression / Assessment and Plan / UC Course  I have reviewed the triage vital signs and the nursing notes.  Pertinent labs & imaging results that were available during my care of the patient were reviewed by me and considered in my medical decision making (see chart for details).  Abscess, labia majora -No indication for drainage at this point.  No area of fluctuance.  Patient afebrile, nontoxic-appearing -Continue sitz bath's -Doxy twice daily x  5 days, Diflucan as needed -Return precautions discussed  Reviewed expections re: course of current medical issues. Questions answered. Outlined signs and symptoms indicating need for more acute intervention. Pt verbalized understanding. AVS given   Final Clinical Impressions(s) / UC Diagnoses   Final diagnoses:  Abscess     Discharge Instructions     Take antibiotic as prescribed.  Continue sitz baths.  Use a cleanser with salicylic acid (Cereve makes one) to prevent further abscess.  Please return for any worsening swelling, fever or chills.    ED Prescriptions    Medication Sig Dispense Auth. Provider   cephALEXin (KEFLEX) 500 MG capsule  (Status: Discontinued) Take 1 capsule (500 mg total) by mouth 4 (four) times daily for 5 days. 20 capsule Rolla Etienne, NP   fluconazole (DIFLUCAN) 150 MG tablet Take 1 tablet (150 mg total) by mouth daily. May repeat  x1 in 3 days if symptoms persist 2 tablet Rolla Etienne, NP   doxycycline (ADOXA) 100 MG tablet Take 1 tablet (100 mg total) by mouth 2 (two) times daily for 5 days. 10 tablet Rolla Etienne, NP     PDMP not reviewed this encounter.   Rolla Etienne, NP 04/08/21 1325

## 2021-04-08 NOTE — Discharge Instructions (Addendum)
Take antibiotic as prescribed.  Continue sitz baths.  Use a cleanser with salicylic acid (Cereve makes one) to prevent further abscess.  Please return for any worsening swelling, fever or chills.

## 2021-04-20 ENCOUNTER — Telehealth (HOSPITAL_COMMUNITY): Payer: Self-pay | Admitting: Emergency Medicine

## 2021-04-24 ENCOUNTER — Other Ambulatory Visit: Payer: Self-pay | Admitting: Obstetrics & Gynecology

## 2021-05-04 ENCOUNTER — Other Ambulatory Visit (HOSPITAL_COMMUNITY)
Admission: RE | Admit: 2021-05-04 | Discharge: 2021-05-04 | Disposition: A | Discharge status: Home / Self Care | Payer: Medicare Other | Source: Ambulatory Visit | Attending: Obstetrics and Gynecology | Admitting: Obstetrics and Gynecology

## 2021-05-04 ENCOUNTER — Other Ambulatory Visit: Payer: Self-pay

## 2021-05-04 ENCOUNTER — Encounter: Payer: Self-pay | Admitting: Obstetrics and Gynecology

## 2021-05-04 ENCOUNTER — Ambulatory Visit (INDEPENDENT_AMBULATORY_CARE_PROVIDER_SITE_OTHER): Payer: Medicare Other | Admitting: Obstetrics and Gynecology

## 2021-05-04 VITALS — BP 130/82

## 2021-05-04 DIAGNOSIS — N760 Acute vaginitis: Secondary | ICD-10-CM

## 2021-05-04 NOTE — Progress Notes (Signed)
GYNECOLOGY  VISIT   HPI: 30 y.o.   Single  African American  female   [phone] with Patient's last menstrual period was 04/25/2021.   here for vaginal discharge with odor. She has discharge most of the month.  Odor comes and goes.   Before her menses last menses she developed brown discharge. She was treated for yeast through urgent care.  She had requested a second prescription but she did not receive this from them.   Office visit 03/25/21 and was treated with Diflucan for presumptive yeast.  Her wet prep was negative.  She had negative GC/CT.  Negative UTI.  Office visit 02/15/21, treated for BV and also received Diflucan Rx for yeast if occurred.  Treated with doxycycline for vulvar abscess on 04/08/21. Received Diflucan Rx for yeast if occurred.   BV and Candida noted 01/02/21.  Normal glucose of 86 on 02/15/21.  Frequency of urination.  No dysuria.   Having pelvic cramping.  Just started this month, onset before her menses. Menses are monthly.   GYNECOLOGIC HISTORY: Patient's last menstrual period was 04/25/2021 (approximate) Contraception:  abstinence Menopausal hormone therapy:  none Last mammogram:  n/a Last pap smear:  jul/2019 Neg        OB History     Gravida  4   Para  1   Term  1   Preterm  0   AB  2   Living  1      SAB  2   IAB  0   Ectopic  0   Multiple  0   Live Births  1              Patient Active Problem List   Diagnosis Date Noted   Gastroesophageal reflux disease without esophagitis 07/13/2020   ILD (interstitial lung disease) (HCC)    History of IUFD 02/14/2017   SS-A antibody positive 06/02/2016   SS-B antibody positive 06/02/2016   Chronic hypertension 03/14/2016   Other secondary pulmonary hypertension (HCC) 09/04/2015   Lupus (systemic lupus erythematosus) (HCC) 08/21/2015   Loud P2 (pulmonary S2, second heart sound) 08/21/2015   Insomnia 08/21/2015   Systemic lupus erythematosus (SLE) affecting pregnancy,  antepartum (HCC) 07/06/2015   Sjogren's syndrome (HCC) 04/28/2015    Past Medical History:  Diagnosis Date   Acute respiratory failure with hypoxemia (HCC) 09/19/2017   Acute respiratory failure with hypoxia (HCC)    Arthritis    "hands and legs" (01/08/2015)   CAP (community acquired pneumonia) 01/07/2015   Chronic Respiratory failure with oxygen requirement affecting pregnancy, antepartum 05/16/2016   Resolved pulmonary HTN on 2018 echo (in our epic).   Pt off O2 in 2018.   Daily headache    "sometimes" (01/08/2015)   GERD (gastroesophageal reflux disease)    Hypertension    No longer takes meds   Lung disease    Lupus (HCC)    Multifocal pneumonia    Pulmonary hypertension (HCC)    Sjogren's syndrome (HCC)     Past Surgical History:  Procedure Laterality Date   CARDIAC CATHETERIZATION N/A 09/09/2015   Procedure: Right Heart Cath;  Surgeon: Laurey Morale, MD;  Location: [facility] INVASIVE CV LAB;  Service: Cardiovascular;  Laterality: N/A;   DILATION AND EVACUATION N/A 07/13/2015   Procedure: DILATATION AND EVACUATION;  Surgeon: Levie Heritage, DO;  Location: [facility] ORS;  Service: Gynecology;  Laterality: N/A;   FINGER SURGERY Right 03/2014   "laceration, nerve/artery injury" 2nd digit   VIDEO BRONCHOSCOPY Bilateral 01/12/2015  Procedure: VIDEO BRONCHOSCOPY WITH FLUORO;  Surgeon: Leslye Peer, MD;  Location: [facility] ENDOSCOPY;  Service: Cardiopulmonary;  Laterality: Bilateral;    Current Outpatient Medications  Medication Sig Dispense Refill   albuterol (PROVENTIL HFA;VENTOLIN HFA) 108 (90 Base) MCG/ACT inhaler Inhale 2 puffs into the lungs every 4 (four) hours as needed for wheezing or shortness of breath. 1 Inhaler 5   azaTHIOprine (IMURAN) 50 MG tablet Take by mouth.     butalbital-acetaminophen-caffeine (FIORICET) 50-325-40 MG tablet Take 1-2 tablets by mouth every 6 (six) hours as needed for headache. 60 tablet 0   cyclobenzaprine (FLEXERIL) 10 MG tablet as needed.      hydroxychloroquine (PLAQUENIL) 200 MG tablet Take 400 mg by mouth daily.     oxyCODONE-acetaminophen (PERCOCET) 10-325 MG tablet Take 1 tablet by mouth 3 (three) times daily as needed.     predniSONE (DELTASONE) 5 MG tablet Take 5 mg by mouth daily with breakfast.     sertraline (ZOLOFT) 50 MG tablet Take by mouth.     No current facility-administered medications for this visit.     ALLERGIES: Hydrocodone and Zithromax [azithromycin]  Family History  Problem Relation Age of Onset   Diabetes Mother    Arthritis Father    Diabetes Maternal Aunt    Diabetes Maternal Grandmother     Social History   Socioeconomic History   Marital status: Single    Spouse name: Not on file   Number of children: Not on file   Years of education: Not on file   Highest education level: Not on file  Occupational History   Occupation: [employer]     Comment: Workers Youth worker  Tobacco Use   Smoking status: Former    Packs/day: 0.10    Years: 5.00    Pack years: 0.50    Types: Cigarettes    Quit date: 11/20/2014    Years since quitting: 6.4   Smokeless tobacco: Never  Vaping Use   Vaping Use: Never used  Substance and Sexual Activity   Alcohol use: Not Currently    Alcohol/week: 0.0 standard drinks   Drug use: No   Sexual activity: Not Currently    Birth control/protection: Abstinence  Other Topics Concern   Not on file  Social History Narrative   Not on file   Social Determinants of Health   Financial Resource Strain: Not on file  Food Insecurity: No Food Insecurity   Worried About Running Out of Food in the Last Year: Never true   Ran Out of Food in the Last Year: Never true  Transportation Needs: No Transportation Needs   Lack of Transportation (Medical): No   Lack of Transportation (Non-Medical): No  Physical Activity: Not on file  Stress: Not on file  Social Connections: Not on file  Intimate Partner Violence: Not on file    Review of Systems  PHYSICAL EXAMINATION:     BP 130/82   LMP 04/25/2021     General appearance: alert, cooperative and appears stated age  Pelvic: External genitalia:  no lesions              Urethra:  normal appearing urethra with no masses, tenderness or lesions              Bartholins and Skenes: normal                 Vagina: normal appearing vagina with normal color and light brown discharge, no lesions  Cervix: no lesions                Bimanual Exam:  Uterus:  normal size, contour, position, consistency, mobility, non-tender              Adnexa: no mass, fullness, tenderness       ASSESSMENT  Recurrent vaginitis.  Hx lupus. On prednisone.   PLAN  Vaginitis Nuswab sent.  We reviewed risk factors for vaginitis.  Vaginal irritants reviewed.  I discussed suppression regimens for yeast and BV.    33 min  total time was spent for this patient encounter, including preparation, face-to-face counseling with the patient, coordination of care, and documentation of the encounter.

## 2021-05-05 LAB — CERVICOVAGINAL ANCILLARY ONLY
Bacterial Vaginitis (gardnerella): NEGATIVE
Candida Glabrata: NEGATIVE
Candida Vaginitis: NEGATIVE
Comment: NEGATIVE
Comment: NEGATIVE
Comment: NEGATIVE
Comment: NEGATIVE
Trichomonas: NEGATIVE

## 2021-05-09 ENCOUNTER — Encounter: Payer: Self-pay | Admitting: Obstetrics and Gynecology

## 2021-05-10 ENCOUNTER — Telehealth: Payer: Self-pay

## 2021-05-10 NOTE — Telephone Encounter (Signed)
I read the patient's message.   We did not check a urine specimen at the time of her visit because she did not have any pain with urination.  We were focusing on vaginitis.   I do recommend a follow up visit because I am not sure what we would be treating.  I really want to help her; I just need more information.

## 2021-05-10 NOTE — Telephone Encounter (Signed)
Left detailed message in voice mail relaying Dr. Rica Records message/recommendation.  Message sent to [facility] to schedule visit.

## 2021-05-10 NOTE — Telephone Encounter (Signed)
Patient said a My Chart message this morning   "Hello, still having a little itch and frequent urine with odor and that bothers me bc that just doesn't seem normal to. I perfer to go ahead n start treatment to see."  I called her about scheduling a visit to check urine, etc.  She said she was just here on 05/04/21 and told Dr. Edward Jolly at that time about this. No other ur tract symptoms. SHe wants to be treated without another visit.

## 2021-05-13 NOTE — Telephone Encounter (Signed)
Our office has tried several times to reach you to schedule a visit. We are happy to see you  as well.

## 2021-06-04 ENCOUNTER — Ambulatory Visit (HOSPITAL_COMMUNITY)
Admission: EM | Admit: 2021-06-04 | Discharge: 2021-06-04 | Disposition: A | Discharge status: Home / Self Care | Payer: Medicare Other | Attending: Student | Admitting: Student

## 2021-06-04 ENCOUNTER — Other Ambulatory Visit: Payer: Self-pay

## 2021-06-10 ENCOUNTER — Encounter (HOSPITAL_COMMUNITY): Payer: Self-pay

## 2021-06-10 ENCOUNTER — Ambulatory Visit (HOSPITAL_COMMUNITY)
Admission: EM | Admit: 2021-06-10 | Discharge: 2021-06-10 | Disposition: A | Discharge status: Home / Self Care | Payer: Medicare Other | Attending: Family Medicine | Admitting: Family Medicine

## 2021-06-10 DIAGNOSIS — N760 Acute vaginitis: Secondary | ICD-10-CM | POA: Insufficient documentation

## 2021-06-10 MED ORDER — FLUCONAZOLE 150 MG PO TABS: 150.0000 mg | ORAL_TABLET | ORAL | 0 refills | Status: DC

## 2021-06-10 NOTE — ED Triage Notes (Signed)
Pt in with c/o vaginal itching x 4 days  States she just finished an antibiotic

## 2021-06-10 NOTE — ED Provider Notes (Signed)
[facility]    CSN: [id number] Arrival date & time: 06/10/21  1254      History   Chief Complaint Chief Complaint  Patient presents with   Vaginal Itching    HPI Kayla [name] is a 30 y.o. female.   Patient presenting today with worsening signs of vaginal itching, mild amount of white discharge.  Denies odor, pelvic pain, abdominal pain, fever, chills, rashes, nausea, vomiting.  Symptoms began after she took an antibiotic.  Has not tried anything over-the-counter for symptoms thus far.  Denies concern for STDs or pregnancy.  History of yeast infections and BV.   Past Medical History:  Diagnosis Date   Acute respiratory failure with hypoxemia (HCC) 09/19/2017   Acute respiratory failure with hypoxia (HCC)    Arthritis    "hands and legs" (01/08/2015)   CAP (community acquired pneumonia) 01/07/2015   Chronic Respiratory failure with oxygen requirement affecting pregnancy, antepartum 05/16/2016   Resolved pulmonary HTN on 2018 echo (in our epic).   Pt off O2 in 2018.   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   Lupus (systemic lupus erythematosus) (HCC) 08/21/2015   Loud P2 (pulmonary S2, second heart sound) 08/21/2015   Insomnia 08/21/2015   Systemic lupus erythematosus (SLE) affecting pregnancy, antepartum (HCC) 07/06/2015   Sjogren's syndrome (HCC) 04/28/2015    Past Surgical History:  Procedure Laterality  Date   CARDIAC CATHETERIZATION N/A 09/09/2015   Procedure: Right Heart Cath;  Surgeon: Laurey Morale, MD;  Location: [facility] INVASIVE CV LAB;  Service: Cardiovascular;  Laterality: N/A;   DILATION AND EVACUATION N/A 07/13/2015   Procedure: DILATATION AND EVACUATION;  Surgeon: Levie Heritage, DO;  Location: [facility] ORS;  Service: Gynecology;  Laterality: N/A;   FINGER SURGERY Right 03/2014   "laceration, nerve/artery injury" 2nd digit   VIDEO BRONCHOSCOPY Bilateral 01/12/2015   Procedure: VIDEO BRONCHOSCOPY WITH FLUORO;  Surgeon: Leslye Peer, MD;  Location: [facility] ENDOSCOPY;  Service: Cardiopulmonary;  Laterality: Bilateral;    OB History     Gravida  4   Para  1   Term  1   Preterm  0   AB  2   Living  1      SAB  2   IAB  0   Ectopic  0   Multiple  0   Live Births  1            Home Medications    Prior to Admission medications   Medication Sig Start Date End Date Taking? Authorizing Provider  fluconazole (DIFLUCAN) 150 MG tablet Take 1 tablet (150 mg total) by mouth every other day. 06/10/21  Yes Particia Nearing, PA-C  albuterol (PROVENTIL HFA;VENTOLIN HFA) 108 (90 Base) MCG/ACT inhaler Inhale 2 puffs into the lungs every 4 (four) hours as needed for wheezing or shortness of breath. 06/09/17   Adam Phenix, MD  azaTHIOprine (  IMURAN) 50 MG tablet Take by mouth. 11/26/20   [provider]  butalbital-acetaminophen-caffeine (FIORICET) 50-325-40 MG tablet Take 1-2 tablets by mouth every 6 (six) hours as needed for headache. 11/18/20   Raelyn Mora, CNM  cyclobenzaprine (FLEXERIL) 10 MG tablet as needed. 08/10/18   [provider]  hydroxychloroquine (PLAQUENIL) 200 MG tablet Take 400 mg by mouth daily.    [provider]  oxyCODONE-acetaminophen (PERCOCET) 10-325 MG tablet Take 1 tablet by mouth 3 (three) times daily as needed. 12/28/20   [provider]  predniSONE (DELTASONE) 5 MG tablet Take 5 mg by mouth daily with breakfast.     [provider]  sertraline (ZOLOFT) 50 MG tablet Take by mouth. 02/15/21 02/15/22  [provider]  amLODipine (NORVASC) 5 MG tablet Take 1 tablet (5 mg total) by mouth daily. 09/22/17 11/09/20  Duayne Cal, NP    Family History Family History  Problem Relation Age of Onset   Diabetes Mother    Arthritis Father    Diabetes Maternal Aunt    Diabetes Maternal Grandmother     Social History Social History   Tobacco Use   Smoking status: Former    Packs/day: 0.10    Years: 5.00    Pack years: 0.50    Types: Cigarettes    Quit date: 11/20/2014    Years since quitting: 6.5   Smokeless tobacco: Never  Vaping Use   Vaping Use: Never used  Substance Use Topics   Alcohol use: Not Currently    Alcohol/week: 0.0 standard drinks   Drug use: No     Allergies   Hydrocodone and Zithromax [azithromycin]   Review of Systems Review of Systems Per HPI  Physical Exam Triage Vital Signs ED Triage Vitals  Enc Vitals Group     BP 06/10/21 1310 111/65     Pulse Rate 06/10/21 1310 86     Resp 06/10/21 1310 18     Temp 06/10/21 1310 99 F (37.2 C)     Temp Source 06/10/21 1310 Oral     SpO2 06/10/21 1310 100 %     Weight --      Height --      Head Circumference --      Peak Flow --      Pain Score 06/10/21 1309 0     Pain Loc --      Pain Edu? --      Excl. in GC? --    No data found.  Updated Vital Signs BP 111/65 (BP Location: Right Arm)   Pulse 86   Temp 99 F (37.2 C) (Oral)   Resp 18   LMP 05/21/2021 (Exact Date)   SpO2 100%   Visual Acuity Right Eye Distance:   Left Eye Distance:   Bilateral Distance:    Right Eye Near:   Left Eye Near:    Bilateral Near:     Physical Exam Vitals and nursing note reviewed.  Constitutional:      Appearance: Normal appearance. She is not ill-appearing.  HENT:     Head: Atraumatic.  Eyes:     Extraocular Movements: Extraocular movements intact.     Conjunctiva/sclera: Conjunctivae normal.   Cardiovascular:     Rate and Rhythm: Normal rate and regular rhythm.     Heart sounds: Normal heart sounds.  Pulmonary:     Effort: Pulmonary effort is normal.     Breath sounds: Normal breath sounds.  Musculoskeletal:  General: Normal range of motion.     Cervical back: Normal range of motion and neck supple.  Skin:    General: Skin is warm and dry.  Neurological:     Mental Status: She is alert and oriented to person, place, and time.  Psychiatric:        Mood and Affect: Mood normal.        Thought Content: Thought content normal.        Judgment: Judgment normal.     UC Treatments / Results  Labs (all labs ordered are listed, but only abnormal results are displayed) Labs Reviewed  CERVICOVAGINAL ANCILLARY ONLY    EKG  Radiology No results found.  Procedures Procedures (including critical care time)  Medications Ordered in UC Medications - No data to display  Initial Impression / Assessment and Plan / UC Course  I have reviewed the triage vital signs and the nursing notes.  Pertinent labs & imaging results that were available during my care of the patient were reviewed by me and considered in my medical decision making (see chart for details).     Given symptoms and recent antibiotic use, suspect yeast vaginitis.  Will treat with Diflucan while awaiting vaginal swab results for further rule out.  Discussed good vaginal hygiene, supportive medications over-the-counter.  Final Clinical Impressions(s) / UC Diagnoses   Final diagnoses:  Acute vaginitis   Discharge Instructions   None    ED Prescriptions     Medication Sig Dispense Auth. Provider   fluconazole (DIFLUCAN) 150 MG tablet Take 1 tablet (150 mg total) by mouth every other day. 3 tablet Particia Nearing, [place]      PDMP not reviewed this encounter.   Particia Nearing, [place] 06/10/21 [phone]

## 2021-06-11 LAB — CERVICOVAGINAL ANCILLARY ONLY
Bacterial Vaginitis (gardnerella): NEGATIVE
Candida Glabrata: NEGATIVE
Candida Vaginitis: NEGATIVE
Chlamydia: NEGATIVE
Comment: NEGATIVE
Comment: NEGATIVE
Comment: NEGATIVE
Comment: NEGATIVE
Comment: NEGATIVE
Comment: NORMAL
Neisseria Gonorrhea: NEGATIVE
Trichomonas: NEGATIVE

## 2021-07-19 NOTE — Progress Notes (Signed)
GYNECOLOGY  VISIT   HPI: 30 y.o.   Single  African American  female   [phone] with Patient's last menstrual period was 10/11/2020.   here for vaginal discharge with odor. She also complains of urinary frequency and back pain. Irritation is both external and internal.   Complaining of menses every 2 weeks. This is occurring July and August.  She has bleeding for 2 - 3 days in the middle of the month each month.  The blood is dark and clotted.   Having right sided abdominal pain for 2.5 weeks, which is constant. Dull pain that can also be squeezing in nature.  Taking oxycodone from [facility] prescriber for her rheumatoid arthritis.   BMs are twice a week.   No pain with urination.   On Predisone 5 mg daily.   Last sexual activity was 2 months ago.  Same partner.   Declines future childbearing.  Used IUD, Nexplanon, and Depo Provera.  Had expulsion of IUD after 3 days.   GYNECOLOGIC HISTORY: Patient's last menstrual period was 10/11/2020. Contraception:  Abstinence Menopausal hormone therapy:  n/a Last mammogram: none Last pap smear: 05-22-18 Neg, 01-29-15 Neg        OB History     Gravida  4   Para  1   Term  1   Preterm  0   AB  2   Living  1      SAB  2   IAB  0   Ectopic  0   Multiple  0   Live Births  1              Patient Active Problem List   Diagnosis Date Noted   Gastroesophageal reflux disease without esophagitis 07/13/2020   ILD (interstitial lung disease) (HCC)    History of IUFD 02/14/2017   SS-A antibody positive 06/02/2016   SS-B antibody positive 06/02/2016   Chronic hypertension 03/14/2016   Other secondary pulmonary hypertension (HCC) 09/04/2015   Lupus (systemic lupus erythematosus) (HCC) 08/21/2015   Loud P2 (pulmonary S2, second heart sound) 08/21/2015   Insomnia 08/21/2015   Systemic lupus erythematosus (SLE) affecting pregnancy, antepartum (HCC) 07/06/2015   Sjogren's syndrome (HCC) 04/28/2015    Past Medical History:   Diagnosis Date   Acute respiratory failure with hypoxemia (HCC) 09/19/2017   Acute respiratory failure with hypoxia (HCC)    Arthritis    "hands and legs" (01/08/2015)   CAP (community acquired pneumonia) 01/07/2015   Chronic Respiratory failure with oxygen requirement affecting pregnancy, antepartum 05/16/2016   Resolved pulmonary HTN on 2018 echo (in our epic).   Pt off O2 in 2018.   Daily headache    "sometimes" (01/08/2015)   GERD (gastroesophageal reflux disease)    Hypertension    No longer takes meds   Lung disease    Lupus (HCC)    Multifocal pneumonia    Pulmonary hypertension (HCC)    Sjogren's syndrome (HCC)     Past Surgical History:  Procedure Laterality Date   CARDIAC CATHETERIZATION N/A 09/09/2015   Procedure: Right Heart Cath;  Surgeon: Laurey Morale, MD;  Location: [facility] INVASIVE CV LAB;  Service: Cardiovascular;  Laterality: N/A;   DILATION AND EVACUATION N/A 07/13/2015   Procedure: DILATATION AND EVACUATION;  Surgeon: Levie Heritage, DO;  Location: [facility] ORS;  Service: Gynecology;  Laterality: N/A;   FINGER SURGERY Right 03/2014   "laceration, nerve/artery injury" 2nd digit   VIDEO BRONCHOSCOPY Bilateral 01/12/2015   Procedure: VIDEO BRONCHOSCOPY WITH FLUORO;  Surgeon: Leslye Peer, MD;  Location: [facility] ENDOSCOPY;  Service: Cardiopulmonary;  Laterality: Bilateral;    Current Outpatient Medications  Medication Sig Dispense Refill   albuterol (PROVENTIL HFA;VENTOLIN HFA) 108 (90 Base) MCG/ACT inhaler Inhale 2 puffs into the lungs every 4 (four) hours as needed for wheezing or shortness of breath. 1 Inhaler 5   azaTHIOprine (IMURAN) 50 MG tablet Take by mouth.     butalbital-acetaminophen-caffeine (FIORICET) 50-325-40 MG tablet Take 1-2 tablets by mouth every 6 (six) hours as needed for headache. 60 tablet 0   cyclobenzaprine (FLEXERIL) 10 MG tablet as needed.     hydroxychloroquine (PLAQUENIL) 200 MG tablet Take 400 mg by mouth daily.     ketoconazole (NIZORAL) 2 %  cream SMARTSIG:1 Application Topical 1 to 2 Times Daily     oxyCODONE-acetaminophen (PERCOCET) 10-325 MG tablet Take 1 tablet by mouth 3 (three) times daily as needed.     predniSONE (DELTASONE) 5 MG tablet Take 5 mg by mouth daily with breakfast.     sertraline (ZOLOFT) 50 MG tablet Take by mouth.     zolpidem (AMBIEN) 10 MG tablet Take 10 mg by mouth at bedtime as needed.     No current facility-administered medications for this visit.     ALLERGIES: Hydrocodone and Zithromax [azithromycin]  Family History  Problem Relation Age of Onset   Diabetes Mother    Arthritis Father    Diabetes Maternal Aunt    Diabetes Maternal Grandmother     Social History   Socioeconomic History   Marital status: Single    Spouse name: Not on file   Number of children: Not on file   Years of education: Not on file   Highest education level: Not on file  Occupational History   Occupation: [employer]     Comment: Workers Youth worker  Tobacco Use   Smoking status: Former    Packs/day: 0.10    Years: 5.00    Pack years: 0.50    Types: Cigarettes    Quit date: 11/20/2014    Years since quitting: 6.6   Smokeless tobacco: Never  Vaping Use   Vaping Use: Never used  Substance and Sexual Activity   Alcohol use: Not Currently    Alcohol/week: 0.0 standard drinks   Drug use: No   Sexual activity: Not Currently    Birth control/protection: Abstinence  Other Topics Concern   Not on file  Social History Narrative   Not on file   Social Determinants of Health   Financial Resource Strain: Not on file  Food Insecurity: No Food Insecurity   Worried About Running Out of Food in the Last Year: Never true   Ran Out of Food in the Last Year: Never true  Transportation Needs: No Transportation Needs   Lack of Transportation (Medical): No   Lack of Transportation (Non-Medical): No  Physical Activity: Not on file  Stress: Not on file  Social Connections: Not on file  Intimate Partner Violence: Not  on file    Review of Systems  Genitourinary:  Positive for flank pain and frequency.       Vaginal discharge with odor  All other systems reviewed and are negative.  PHYSICAL EXAMINATION:    BP 116/74   Pulse 70   Resp 16   Wt 175 lb (79.4 kg)   LMP 10/11/2020   BMI 31.00 kg/m     General appearance: alert, cooperative and appears stated age  Abdomen: soft, non-tender, no masses,  no organomegaly  Pelvic: External genitalia:  no lesions              Urethra:  normal appearing urethra with no masses, tenderness or lesions              Bartholins and Skenes: normal                 Vagina: normal appearing vagina with normal color and yellow mucousy discharge, no lesions              Cervix: no lesions                Bimanual Exam:  Uterus:  normal size, contour, position, consistency, mobility, non-tender              Adnexa: no mass, fullness, tenderness              Rectal exam: yes.  Confirms.              Anus:  normal sphincter tone, no lesions  Chaperone was present for exam:  Marchelle Folks CMA  ASSESSMENT  Recurrent vaginitis.  On Prednisone.  RQL pain. Irregular menses. Lupus.  Pulmonary HTN.   PLAN  UPT - neg Urinalysis and culture.  Vaginitis and cervicitis testing.  Mycolog II.  Avoid irritants.  Return for pelvic US.  Consider Mirena IUD?  An After Visit Summary was printed and given to the patient.  27 min  total time was spent for this patient encounter, including preparation, face-to-face counseling with the patient, coordination of care, and documentation of the encounter.

## 2021-07-20 ENCOUNTER — Other Ambulatory Visit (HOSPITAL_COMMUNITY)
Admission: RE | Admit: 2021-07-20 | Discharge: 2021-07-20 | Disposition: A | Discharge status: Home / Self Care | Payer: Medicare Other | Source: Ambulatory Visit | Attending: Obstetrics and Gynecology | Admitting: Obstetrics and Gynecology

## 2021-07-20 ENCOUNTER — Other Ambulatory Visit: Payer: Self-pay

## 2021-07-20 ENCOUNTER — Ambulatory Visit (INDEPENDENT_AMBULATORY_CARE_PROVIDER_SITE_OTHER): Payer: Medicare Other | Admitting: Obstetrics and Gynecology

## 2021-07-20 ENCOUNTER — Encounter: Payer: Self-pay | Admitting: Obstetrics and Gynecology

## 2021-07-20 VITALS — BP 116/74 | HR 70 | Resp 16 | Wt 175.0 lb

## 2021-07-20 DIAGNOSIS — R350 Frequency of micturition: Secondary | ICD-10-CM | POA: Diagnosis not present

## 2021-07-20 DIAGNOSIS — Z113 Encounter for screening for infections with a predominantly sexual mode of transmission: Secondary | ICD-10-CM

## 2021-07-20 DIAGNOSIS — Z3202 Encounter for pregnancy test, result negative: Secondary | ICD-10-CM

## 2021-07-20 DIAGNOSIS — N926 Irregular menstruation, unspecified: Secondary | ICD-10-CM

## 2021-07-20 DIAGNOSIS — N761 Subacute and chronic vaginitis: Secondary | ICD-10-CM | POA: Diagnosis present

## 2021-07-20 DIAGNOSIS — R1031 Right lower quadrant pain: Secondary | ICD-10-CM

## 2021-07-20 LAB — PREGNANCY, URINE: Preg Test, Ur: NEGATIVE

## 2021-07-20 MED ORDER — NYSTATIN-TRIAMCINOLONE 100000-0.1 UNIT/GM-% EX OINT: 1.0000 "application " | TOPICAL_OINTMENT | Freq: Two times a day (BID) | CUTANEOUS | 0 refills | Status: DC

## 2021-07-21 LAB — CERVICOVAGINAL ANCILLARY ONLY
Bacterial Vaginitis (gardnerella): NEGATIVE
Candida Glabrata: NEGATIVE
Candida Vaginitis: NEGATIVE
Chlamydia: NEGATIVE
Comment: NEGATIVE
Comment: NEGATIVE
Comment: NEGATIVE
Comment: NEGATIVE
Comment: NEGATIVE
Comment: NORMAL
Neisseria Gonorrhea: NEGATIVE
Trichomonas: NEGATIVE

## 2021-07-22 LAB — URINALYSIS, COMPLETE W/RFL CULTURE
Glucose, UA: NEGATIVE
Hgb urine dipstick: NEGATIVE
Hyaline Cast: NONE SEEN /LPF
Ketones, ur: NEGATIVE
Leukocyte Esterase: NEGATIVE
Nitrites, Initial: NEGATIVE
Protein, ur: NEGATIVE
RBC / HPF: NONE SEEN /HPF (ref 0–2)
Specific Gravity, Urine: 1.022 (ref 1.001–1.035)
pH: 5.5 (ref 5.0–8.0)

## 2021-07-22 LAB — URINE CULTURE
MICRO NUMBER:: 12309541
Result:: NO GROWTH
SPECIMEN QUALITY:: ADEQUATE

## 2021-07-22 LAB — CULTURE INDICATED

## 2021-08-06 ENCOUNTER — Ambulatory Visit (HOSPITAL_COMMUNITY)
Admission: EM | Admit: 2021-08-06 | Discharge: 2021-08-06 | Disposition: A | Discharge status: Home / Self Care | Payer: Medicare Other | Attending: Emergency Medicine | Admitting: Emergency Medicine

## 2021-08-06 ENCOUNTER — Other Ambulatory Visit: Payer: Self-pay

## 2021-08-06 ENCOUNTER — Encounter (HOSPITAL_COMMUNITY): Payer: Self-pay | Admitting: Emergency Medicine

## 2021-08-06 DIAGNOSIS — S39012A Strain of muscle, fascia and tendon of lower back, initial encounter: Secondary | ICD-10-CM | POA: Diagnosis not present

## 2021-08-06 DIAGNOSIS — S161XXA Strain of muscle, fascia and tendon at neck level, initial encounter: Secondary | ICD-10-CM | POA: Diagnosis not present

## 2021-08-06 DIAGNOSIS — M62838 Other muscle spasm: Secondary | ICD-10-CM

## 2021-08-06 MED ORDER — TIZANIDINE HCL 4 MG PO TABS: 4.0000 mg | ORAL_TABLET | Freq: Three times a day (TID) | ORAL | 0 refills | Status: DC | PRN

## 2021-08-06 MED ORDER — IBUPROFEN 600 MG PO TABS
600.0000 mg | ORAL_TABLET | Freq: Four times a day (QID) | ORAL | 0 refills | Status: DC | PRN
Start: 2021-08-06 — End: 2022-11-02

## 2021-08-06 NOTE — Discharge Instructions (Addendum)
People tend to feel worse over the next several days, but most people are back to normal in 1 week. A small number of people will have persistent pain for up to six weeks. Take the 600 mg of ibuprofen combined with 1000 mg of tylenol 3-4 times a day.  Zanaflex for muscle spasms.  Gentle stretching, Epson salt soaks and deep tissue massage may also be helpful.   Go to www.goodrx.com  or www.costplusdrugs.com to look up your medications. This will give you a list of where you can find your prescriptions at the most affordable prices. Or ask the pharmacist what the cash [place] is, or if they have any other discount programs available to help make your medication more affordable. This can be less expensive than what you would pay with insurance.
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
## 2021-08-10 ENCOUNTER — Encounter: Payer: Self-pay | Admitting: Obstetrics and Gynecology

## 2021-08-10 ENCOUNTER — Ambulatory Visit (INDEPENDENT_AMBULATORY_CARE_PROVIDER_SITE_OTHER): Payer: Medicare Other

## 2021-08-10 ENCOUNTER — Other Ambulatory Visit: Payer: Self-pay

## 2021-08-10 ENCOUNTER — Ambulatory Visit (INDEPENDENT_AMBULATORY_CARE_PROVIDER_SITE_OTHER): Payer: Medicare Other | Admitting: Obstetrics and Gynecology

## 2021-08-10 VITALS — BP 104/70 | HR 76 | Ht 63.0 in | Wt 175.0 lb

## 2021-08-10 DIAGNOSIS — R1031 Right lower quadrant pain: Secondary | ICD-10-CM

## 2021-08-10 DIAGNOSIS — N939 Abnormal uterine and vaginal bleeding, unspecified: Secondary | ICD-10-CM

## 2021-08-10 DIAGNOSIS — N926 Irregular menstruation, unspecified: Secondary | ICD-10-CM

## 2021-08-10 NOTE — Progress Notes (Signed)
GYNECOLOGY  VISIT   HPI: 30 y.o.   Single  African American  female   [phone] with Patient's last menstrual period was 08/05/2021.   here for pelvic ultrasound for menses every 2 weeks in July and August.  Also having midcycle bleeding.   Menses this month 08/05/21 to 08/09/21.  This is her normal cycle.  She reported episode of right sided abdominal pain, dull and squeezing in nature. She took oxycodone she had on hand.   GYNECOLOGIC HISTORY: Patient's last menstrual period was 08/05/2021. Contraception: Abstinence Menopausal hormone therapy:  none Last mammogram: none Last pap smear: 05-22-18 Neg, 01-29-15 Neg        OB History     Gravida  4   Para  1   Term  1   Preterm  0   AB  2   Living  1      SAB  2   IAB  0   Ectopic  0   Multiple  0   Live Births  1              Patient Active Problem List   Diagnosis Date Noted   Gastroesophageal reflux disease without esophagitis 07/13/2020   ILD (interstitial lung disease) (HCC)    History of IUFD 02/14/2017   SS-A antibody positive 06/02/2016   SS-B antibody positive 06/02/2016   Chronic hypertension 03/14/2016   Other secondary pulmonary hypertension (HCC) 09/04/2015   Lupus (systemic lupus erythematosus) (HCC) 08/21/2015   Loud P2 (pulmonary S2, second heart sound) 08/21/2015   Insomnia 08/21/2015   Systemic lupus erythematosus (SLE) affecting pregnancy, antepartum (HCC) 07/06/2015   Sjogren's syndrome (HCC) 04/28/2015    Past Medical History:  Diagnosis Date   Acute respiratory failure with hypoxemia (HCC) 09/19/2017   Acute respiratory failure with hypoxia (HCC)    Arthritis    "hands and legs" (01/08/2015)   CAP (community acquired pneumonia) 01/07/2015   Chronic Respiratory failure with oxygen requirement affecting pregnancy, antepartum 05/16/2016   Resolved pulmonary HTN on 2018 echo (in our epic).   Pt off O2 in 2018.   Daily headache    "sometimes" (01/08/2015)   GERD (gastroesophageal  reflux disease)    Hypertension    No longer takes meds   Lung disease    Lupus (HCC)    Multifocal pneumonia    Pulmonary hypertension (HCC)    Sjogren's syndrome (HCC)     Past Surgical History:  Procedure Laterality Date   CARDIAC CATHETERIZATION N/A 09/09/2015   Procedure: Right Heart Cath;  Surgeon: Laurey Morale, MD;  Location: [facility] INVASIVE CV LAB;  Service: Cardiovascular;  Laterality: N/A;   DILATION AND EVACUATION N/A 07/13/2015   Procedure: DILATATION AND EVACUATION;  Surgeon: Levie Heritage, DO;  Location: [facility] ORS;  Service: Gynecology;  Laterality: N/A;   FINGER SURGERY Right 03/2014   "laceration, nerve/artery injury" 2nd digit   VIDEO BRONCHOSCOPY Bilateral 01/12/2015   Procedure: VIDEO BRONCHOSCOPY WITH FLUORO;  Surgeon: Leslye Peer, MD;  Location: [facility] ENDOSCOPY;  Service: Cardiopulmonary;  Laterality: Bilateral;    Current Outpatient Medications  Medication Sig Dispense Refill   albuterol (PROVENTIL HFA;VENTOLIN HFA) 108 (90 Base) MCG/ACT inhaler Inhale 2 puffs into the lungs every 4 (four) hours as needed for wheezing or shortness of breath. 1 Inhaler 5   azaTHIOprine (IMURAN) 50 MG tablet Take by mouth.     hydroxychloroquine (PLAQUENIL) 200 MG tablet Take 400 mg by mouth daily.     ibuprofen (ADVIL) 600  MG tablet Take 1 tablet (600 mg total) by mouth every 6 (six) hours as needed. 30 tablet 0   ketoconazole (NIZORAL) 2 % cream SMARTSIG:1 Application Topical 1 to 2 Times Daily     nystatin-triamcinolone ointment (MYCOLOG) Apply 1 application topically 2 (two) times daily. 30 g 0   sertraline (ZOLOFT) 50 MG tablet Take by mouth.     tiZANidine (ZANAFLEX) 4 MG tablet Take 1 tablet (4 mg total) by mouth every 8 (eight) hours as needed for muscle spasms. 30 tablet 0   zolpidem (AMBIEN) 10 MG tablet Take 10 mg by mouth at bedtime as needed.     No current facility-administered medications for this visit.     ALLERGIES: Hydrocodone and Zithromax  [azithromycin]  Family History  Problem Relation Age of Onset   Diabetes Mother    Arthritis Father    Diabetes Maternal Aunt    Diabetes Maternal Grandmother     Social History   Socioeconomic History   Marital status: Single    Spouse name: Not on file   Number of children: Not on file   Years of education: Not on file   Highest education level: Not on file  Occupational History   Occupation: [employer]     Comment: Workers Youth worker  Tobacco Use   Smoking status: Former    Packs/day: 0.10    Years: 5.00    Pack years: 0.50    Types: Cigarettes    Quit date: 11/20/2014    Years since quitting: 6.7   Smokeless tobacco: Never  Vaping Use   Vaping Use: Never used  Substance and Sexual Activity   Alcohol use: Not Currently    Alcohol/week: 0.0 standard drinks   Drug use: No   Sexual activity: Not Currently    Birth control/protection: Abstinence  Other Topics Concern   Not on file  Social History Narrative   Not on file   Social Determinants of Health   Financial Resource Strain: Not on file  Food Insecurity: No Food Insecurity   Worried About Running Out of Food in the Last Year: Never true   Ran Out of Food in the Last Year: Never true  Transportation Needs: No Transportation Needs   Lack of Transportation (Medical): No   Lack of Transportation (Non-Medical): No  Physical Activity: Not on file  Stress: Not on file  Social Connections: Not on file  Intimate Partner Violence: Not on file    Review of Systems   See HPI.   PHYSICAL EXAMINATION:    BP 104/70   Pulse 76   Ht 5\' 3"  (1.6 m)   Wt 175 lb (79.4 kg)   LMP 08/05/2021   BMI 31.00 kg/m     General appearance: alert, cooperative and appears stated age  Pelvic 08/07/2021 Uterus 8.5 x 5.45. x 4.51 cm.  EMS 7.52 mm.  Thin and symmetrical.   Calcifications at fundal endometrium.  Mixed echogenicity adjacent to cervical cavity, 1.4 x 0.6 cm with vascularity.  Possible polyp. Ovaries normal.  Resolving  right CL cyst.  No adnexal masses.  Trace free fluid.   ASSESSMENT  Abnormal uterine bleeding. Possible cervical polyp. RLQ pain. Lupus.  Pulmonary HTN.    PLAN  [place] images and report reviewed with patient.  I do attribute a possible cervical polyp to her abnormal uterine bleeding.  Will do observational management of her cycles.  If needs treatment, consider progesterone therapy:  cyclic Provera, progesterone only birth control pill, Mirena IUD, Depo Provera.  Return for annual exam in December.  Will need pap and HR HPV then.    An After Visit Summary was printed and given to the patient.  24 min  total time was spent for this patient encounter, including preparation, face-to-face counseling with the patient, coordination of care, and documentation of the encounter.

## 2021-08-17 ENCOUNTER — Ambulatory Visit (HOSPITAL_COMMUNITY)
Admission: EM | Admit: 2021-08-17 | Discharge: 2021-08-17 | Disposition: A | Discharge status: Home / Self Care | Payer: Medicare Other | Attending: Emergency Medicine | Admitting: Emergency Medicine

## 2021-08-17 ENCOUNTER — Other Ambulatory Visit: Payer: Self-pay

## 2021-08-17 DIAGNOSIS — N898 Other specified noninflammatory disorders of vagina: Secondary | ICD-10-CM

## 2021-08-17 DIAGNOSIS — L739 Follicular disorder, unspecified: Secondary | ICD-10-CM | POA: Insufficient documentation

## 2021-08-17 LAB — POCT URINALYSIS DIPSTICK, ED / UC
Bilirubin Urine: NEGATIVE
Glucose, UA: NEGATIVE mg/dL
Hgb urine dipstick: NEGATIVE
Ketones, ur: NEGATIVE mg/dL
Leukocytes,Ua: NEGATIVE
Nitrite: NEGATIVE
Protein, ur: NEGATIVE mg/dL
Specific Gravity, Urine: 1.02 (ref 1.005–1.030)
Urobilinogen, UA: 0.2 mg/dL (ref 0.0–1.0)
pH: 6 (ref 5.0–8.0)

## 2021-08-17 LAB — POC URINE PREG, ED: Preg Test, Ur: NEGATIVE

## 2021-08-17 MED ORDER — FLUCONAZOLE 150 MG PO TABS: ORAL_TABLET | ORAL | 0 refills | Status: DC

## 2021-08-17 MED ORDER — DOXYCYCLINE HYCLATE 100 MG PO CAPS: 100.0000 mg | ORAL_CAPSULE | Freq: Two times a day (BID) | ORAL | 0 refills | Status: DC

## 2021-08-17 NOTE — ED Triage Notes (Signed)
Pt c/o abscess/boil in right groin x 4-5 days. Reports is painful. Reports little spot of drainage from it this morning.

## 2021-08-17 NOTE — Discharge Instructions (Addendum)
Take the doxycycline 1 pill twice a day for the next 10 days. Take the diflucan 1 pill today.  You can repeat the pill when you complete your antibiotics.   Apply a warm compress to the areas 3-4 times a day.  You can take Tylenol and/or ibuprofen as needed for pain relief and fever reduction.  Return for reevaluation for any worsening symptoms including worsening redness, swelling, red streaks, or fevers.  Follow up with your primary care provider for re-evaluation as soon as possible.

## 2021-08-17 NOTE — ED Provider Notes (Signed)
[facility]    CSN: [id number] Arrival date & time: 08/17/21  1119      History   Chief Complaint Chief Complaint  Patient presents with   Recurrent Skin Infections    HPI Kayla [name] is a 30 y.o. female.   Patient here for evaluation of right groin abscess that has been ongoing for the past several days.  Reports area has gotten progressively more painful and swollen.  Reports possibly draining starting this morning.  Has not used any OTC medications or treatments.  Also concerned about a yeast infection or BV and would like to be tested for that.  Denies any concerns about STIs. Denies any trauma, injury, or other precipitating event.  Denies any specific alleviating or aggravating factors.  Denies any fevers, chest pain, shortness of breath, N/V/D, numbness, tingling, weakness, abdominal pain, or headaches.    The history is provided by the patient.   Past Medical History:  Diagnosis Date   Acute respiratory failure with hypoxemia (HCC) 09/19/2017   Acute respiratory failure with hypoxia (HCC)    Arthritis    "hands and legs" (01/08/2015)   CAP (community acquired pneumonia) 01/07/2015   Chronic Respiratory failure with oxygen requirement affecting pregnancy, antepartum 05/16/2016   Resolved pulmonary HTN on 2018 echo (in our epic).   Pt off O2 in 2018.   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   Lupus  (systemic lupus erythematosus) (HCC) 08/21/2015   Loud P2 (pulmonary S2, second heart sound) 08/21/2015   Insomnia 08/21/2015   Systemic lupus erythematosus (SLE) affecting pregnancy, antepartum (HCC) 07/06/2015   Sjogren's syndrome (HCC) 04/28/2015    Past Surgical History:  Procedure Laterality Date   CARDIAC CATHETERIZATION N/A 09/09/2015   Procedure: Right Heart Cath;  Surgeon: Laurey Morale, MD;  Location: [facility] INVASIVE CV LAB;  Service: Cardiovascular;  Laterality: N/A;   DILATION AND EVACUATION N/A 07/13/2015   Procedure: DILATATION AND EVACUATION;  Surgeon: Levie Heritage, DO;  Location: [facility] ORS;  Service: Gynecology;  Laterality: N/A;   FINGER SURGERY Right 03/2014   "laceration, nerve/artery injury" 2nd digit   VIDEO BRONCHOSCOPY Bilateral 01/12/2015   Procedure: VIDEO BRONCHOSCOPY WITH FLUORO;  Surgeon: Leslye Peer, MD;  Location: [facility] ENDOSCOPY;  Service: Cardiopulmonary;  Laterality: Bilateral;    OB History     Gravida  4   Para  1   Term  1   Preterm  0   AB  2   Living  1      SAB  2   IAB  0   Ectopic  0   Multiple  0   Live Births  1            Home Medications    Prior to Admission medications   Medication Sig Start Date End Date Taking? Authorizing Provider  doxycycline (VIBRAMYCIN) 100 MG capsule Take 1 capsule (  100 mg total) by mouth 2 (two) times daily. 08/17/21  Yes Ivette Loyal, NP  fluconazole (DIFLUCAN) 150 MG tablet Take one pill today.  Take the second pill when you finish your antibiotics. 08/17/21  Yes Ivette Loyal, NP  albuterol (PROVENTIL HFA;VENTOLIN HFA) 108 (90 Base) MCG/ACT inhaler Inhale 2 puffs into the lungs every 4 (four) hours as needed for wheezing or shortness of breath. 06/09/17   Adam Phenix, MD  azaTHIOprine (IMURAN) 50 MG tablet Take by mouth. 11/26/20   [provider]  hydroxychloroquine (PLAQUENIL) 200 MG tablet Take 400 mg by mouth daily.    [provider]  ibuprofen (ADVIL) 600 MG  tablet Take 1 tablet (600 mg total) by mouth every 6 (six) hours as needed. 08/06/21   Domenick Gong, MD  ketoconazole (NIZORAL) 2 % cream SMARTSIG:1 Application Topical 1 to 2 Times Daily 04/05/21   [provider]  nystatin-triamcinolone ointment (MYCOLOG) Apply 1 application topically 2 (two) times daily. 07/20/21   Patton Salles, MD  sertraline (ZOLOFT) 50 MG tablet Take by mouth. 02/15/21 02/15/22  [provider]  tiZANidine (ZANAFLEX) 4 MG tablet Take 1 tablet (4 mg total) by mouth every 8 (eight) hours as needed for muscle spasms. 08/06/21   Domenick Gong, MD  zolpidem (AMBIEN) 10 MG tablet Take 10 mg by mouth at bedtime as needed. 06/25/21   [provider]  amLODipine (NORVASC) 5 MG tablet Take 1 tablet (5 mg total) by mouth daily. 09/22/17 11/09/20  Duayne Cal, NP    Family History Family History  Problem Relation Age of Onset   Diabetes Mother    Arthritis Father    Diabetes Maternal Aunt    Diabetes Maternal Grandmother     Social History Social History   Tobacco Use   Smoking status: Former    Packs/day: 0.10    Years: 5.00    Pack years: 0.50    Types: Cigarettes    Quit date: 11/20/2014    Years since quitting: 6.7   Smokeless tobacco: Never  Vaping Use   Vaping Use: Never used  Substance Use Topics   Alcohol use: Not Currently    Alcohol/week: 0.0 standard drinks   Drug use: No     Allergies   Hydrocodone and Zithromax [azithromycin]   Review of Systems Review of Systems  Skin:  Positive for wound.  All other systems reviewed and are negative.   Physical Exam Triage Vital Signs ED Triage Vitals  Enc Vitals Group     BP 08/17/21 1318 122/82     Pulse Rate 08/17/21 1318 72     Resp 08/17/21 1318 16     Temp 08/17/21 1318 98.5 F (36.9 C)     Temp Source 08/17/21 1318 Oral     SpO2 08/17/21 1318 99 %     Weight --      Height --      Head Circumference --      Peak Flow --      Pain Score 08/17/21  1317 10     Pain Loc --      Pain Edu? --      Excl. in GC? --    No data found.  Updated Vital Signs BP 122/82 (BP Location: Right Arm)   Pulse 72   Temp 98.5 F (36.9 C) (Oral)   Resp 16   LMP 08/05/2021   SpO2 99%   Visual Acuity Right Eye Distance:  Left Eye Distance:   Bilateral Distance:    Right Eye Near:   Left Eye Near:    Bilateral Near:     Physical Exam Vitals and nursing note reviewed.  Constitutional:      General: She is not in acute distress.    Appearance: Normal appearance. She is not ill-appearing, toxic-appearing or diaphoretic.  HENT:     Head: Normocephalic and atraumatic.  Eyes:     Conjunctiva/sclera: Conjunctivae normal.  Cardiovascular:     Rate and Rhythm: Normal rate.     Pulses: Normal pulses.  Pulmonary:     Effort: Pulmonary effort is normal.  Abdominal:     General: Abdomen is flat.  Musculoskeletal:        General: Normal range of motion.     Cervical back: Normal range of motion.  Skin:    General: Skin is warm and dry.     Findings: Abscess (right groin with purulent drainage) present.  Neurological:     General: No focal deficit present.     Mental Status: She is alert and oriented to person, place, and time.  Psychiatric:        Mood and Affect: Mood normal.     UC Treatments / Results  Labs (all labs ordered are listed, but only abnormal results are displayed) Labs Reviewed  POCT URINALYSIS DIPSTICK, ED / UC  POC URINE PREG, ED  CERVICOVAGINAL ANCILLARY ONLY    EKG   Radiology No results found.  Procedures Procedures (including critical care time)  Medications Ordered in UC Medications - No data to display  Initial Impression / Assessment and Plan / UC Course  I have reviewed the triage vital signs and the nursing notes.  Pertinent labs & imaging results that were available during my care of the patient were reviewed by me and considered in my medical decision making (see chart for details).     Assessment negative for red flags or concerns.  Abscess in right groin that is likely folliculitis.  Abscess already draining purulent drainage so I&D not required at this time.  We will treat with doxycycline twice daily for the next 10 days.  Patient reports concern about yeast infection but also reports getting yeast infections with antibiotic use so we will go ahead and treat with Diflucan 1 pill today and 1 when completing antibiotics.  Tylenol and/or ibuprofen as needed.  Apply warm compresses 3-4 times a day to abscess.  Self swab obtained to check for BV and yeast, declines STI testing.  Urinalysis with no signs of infection, urine pregnancy test negative.  Follow-up for any worsening signs of infection.  Follow-up with primary care for reevaluation. Final Clinical Impressions(s) / UC Diagnoses   Final diagnoses:  Folliculitis  Vaginal irritation     Discharge Instructions      Take the doxycycline 1 pill twice a day for the next 10 days. Take the diflucan 1 pill today.  You can repeat the pill when you complete your antibiotics.   Apply a warm compress to the areas 3-4 times a day.  You can take Tylenol and/or ibuprofen as needed for pain relief and fever reduction.  Return for reevaluation for any worsening symptoms including worsening redness, swelling, red streaks, or fevers.  Follow up with your primary care provider for re-evaluation as soon as possible.       ED Prescriptions     Medication Sig Dispense Auth. Provider   doxycycline (VIBRAMYCIN) 100 MG capsule Take 1  capsule (100 mg total) by mouth 2 (two) times daily. 20 capsule Ivette Loyal, NP   fluconazole (DIFLUCAN) 150 MG tablet Take one pill today.  Take the second pill when you finish your antibiotics. 2 tablet Ivette Loyal, NP      PDMP not reviewed this encounter.   Ivette Loyal, NP 08/17/21 1347

## 2021-08-18 LAB — CERVICOVAGINAL ANCILLARY ONLY
Bacterial Vaginitis (gardnerella): NEGATIVE
Candida Glabrata: NEGATIVE
Candida Vaginitis: NEGATIVE
Comment: NEGATIVE
Comment: NEGATIVE
Comment: NEGATIVE

## 2022-02-08 ENCOUNTER — Encounter: Payer: Self-pay | Admitting: Nurse Practitioner

## 2022-02-08 ENCOUNTER — Ambulatory Visit (INDEPENDENT_AMBULATORY_CARE_PROVIDER_SITE_OTHER): Payer: Medicare Other | Admitting: Nurse Practitioner

## 2022-02-08 ENCOUNTER — Other Ambulatory Visit: Payer: Self-pay

## 2022-02-08 VITALS — BP 122/78

## 2022-02-08 DIAGNOSIS — Z113 Encounter for screening for infections with a predominantly sexual mode of transmission: Secondary | ICD-10-CM

## 2022-02-08 DIAGNOSIS — N760 Acute vaginitis: Secondary | ICD-10-CM

## 2022-02-08 DIAGNOSIS — B9689 Other specified bacterial agents as the cause of diseases classified elsewhere: Secondary | ICD-10-CM

## 2022-02-08 DIAGNOSIS — N898 Other specified noninflammatory disorders of vagina: Secondary | ICD-10-CM

## 2022-02-08 DIAGNOSIS — Z114 Encounter for screening for human immunodeficiency virus [HIV]: Secondary | ICD-10-CM | POA: Diagnosis not present

## 2022-02-08 LAB — WET PREP FOR TRICH, YEAST, CLUE

## 2022-02-08 MED ORDER — METRONIDAZOLE 0.75 % VA GEL: 1.0000 | Freq: Every day | VAGINAL | 0 refills | Status: AC

## 2022-02-08 NOTE — Progress Notes (Signed)
? ?  Acute Office Visit ? ?Subjective:  ? ? [name], female    DOB: [birth year], 31 y.o.   MRN: [id number] ? ? ?HPI ?31 y.o. presents today for vaginal discharge and odor. Was provided Diflucan by rheumatologist 01/25/2022 for suspected yeast infection but symptoms did not improve. She does have history of BV. Not sexually active since December.  ? ? ?Review of Systems  ?Constitutional: Negative.   ?Genitourinary:  Positive for vaginal discharge and vaginal pain (Itching).  ?     Vaginal odor  ? ?   ?Objective:  ?  ?Physical Exam ?Constitutional:   ?   Appearance: Normal appearance.  ?Genitourinary: ?   General: Normal vulva.  ?   Vagina: Vaginal discharge present. No erythema.  ?   Cervix: Normal.  ? ? ?BP 122/78   LMP 01/31/2022  ?Wt Readings from Last 3 Encounters:  ?08/10/21 175 lb (79.4 kg)  ?07/20/21 175 lb (79.4 kg)  ?03/25/21 178 lb (80.7 kg)  ? ?Wet prep negative for yeast, clue cells, and trich. Few wbcs, many bacteria ?   ? ?Patient informed chaperone available to be present for breast and pelvic exam. Patient has requested no chaperone to be present. Patient has been advised what will be completed during breast and pelvic exam.  ? ?Assessment & Plan:  ? ?Problem List Items Addressed This Visit   ?None ?Visit Diagnoses   ? ? Vaginal discharge    -  Primary  ? Relevant Orders  ? WET PREP FOR TRICH, YEAST, CLUE  ? Screen for STD (sexually transmitted disease)      ? Relevant Orders  ? C. trachomatis/N. gonorrhoeae RNA  ? RPR  ? HIV Antibody (routine testing w rflx)  ? Encounter for screening for human immunodeficiency virus (HIV)      ? Relevant Orders  ? HIV Antibody (routine testing w rflx)  ? Bacterial vaginosis      ? Relevant Medications  ? metroNIDAZOLE (METROGEL) 0.75 % vaginal gel  ? ?  ? ?Plan: Will treat for clinical BV - Metrogel 0.75% nightly x 5 nights. STD panel pending.  ? ? ? ? ?[name] DNP, 10:56 AM 02/08/2022 ? ?

## 2022-02-09 LAB — C. TRACHOMATIS/N. GONORRHOEAE RNA
C. trachomatis RNA, TMA: NOT DETECTED
N. gonorrhoeae RNA, TMA: NOT DETECTED

## 2022-02-09 LAB — HIV ANTIBODY (ROUTINE TESTING W REFLEX): HIV 1&2 Ab, 4th Generation: NONREACTIVE

## 2022-02-09 LAB — RPR: RPR Ser Ql: NONREACTIVE

## 2022-03-08 ENCOUNTER — Encounter: Payer: Self-pay | Admitting: Nurse Practitioner

## 2022-03-08 ENCOUNTER — Ambulatory Visit (INDEPENDENT_AMBULATORY_CARE_PROVIDER_SITE_OTHER): Payer: Medicare Other | Admitting: Nurse Practitioner

## 2022-03-08 ENCOUNTER — Other Ambulatory Visit (HOSPITAL_COMMUNITY)
Admission: RE | Admit: 2022-03-08 | Discharge: 2022-03-08 | Disposition: A | Discharge status: Home / Self Care | Payer: Medicare Other | Source: Ambulatory Visit | Attending: Nurse Practitioner | Admitting: Nurse Practitioner

## 2022-03-08 VITALS — BP 126/84 | Ht 63.0 in | Wt 180.0 lb

## 2022-03-08 DIAGNOSIS — Z01419 Encounter for gynecological examination (general) (routine) without abnormal findings: Secondary | ICD-10-CM

## 2022-03-08 DIAGNOSIS — Z8619 Personal history of other infectious and parasitic diseases: Secondary | ICD-10-CM | POA: Diagnosis not present

## 2022-03-08 DIAGNOSIS — Z9189 Other specified personal risk factors, not elsewhere classified: Secondary | ICD-10-CM

## 2022-03-08 DIAGNOSIS — N898 Other specified noninflammatory disorders of vagina: Secondary | ICD-10-CM | POA: Diagnosis not present

## 2022-03-08 DIAGNOSIS — Z124 Encounter for screening for malignant neoplasm of cervix: Secondary | ICD-10-CM

## 2022-03-08 DIAGNOSIS — B3731 Acute candidiasis of vulva and vagina: Secondary | ICD-10-CM

## 2022-03-08 LAB — WET PREP FOR TRICH, YEAST, CLUE

## 2022-03-08 MED ORDER — FLUCONAZOLE 150 MG PO TABS
150.0000 mg | ORAL_TABLET | ORAL | 3 refills | Status: DC
Start: 2022-03-08 — End: 2022-04-13

## 2022-03-08 NOTE — Progress Notes (Signed)
? ?  Kayla [name] 10-06-1991 [id number] ? ? ?History:  31 y.o. [id number] presents for annual exam. Monthly cycles. Normal pap history. Lupus, Sjogren's managed by rheumatology. Negative STD screening 02/08/2022. Complains of vaginal discharge without itching or odor. She is on antibiotic for tooth issue and will remain on this until June.  ? ?Gynecologic History ?Patient's last menstrual period was 02/28/2022. ?Period Cycle (Days): 28 ?Period Duration (Days): 4 ?Period Pattern: Regular ?Menstrual Flow: Moderate ?Dysmenorrhea: None ?Contraception: none ?Sexually active: No ? ?Health maintenance ?Last Pap: 05/22/2018. Results were: Normal ?Last mammogram: Not indicated ?Last colonoscopy: Not indicated ?Last Dexa: Not indicated ? ?Past medical history, past surgical history, family history and social history were all reviewed and documented in the EPIC chart. Single. Works in [employer]. 14 yo daughter.  ? ?ROS:  A ROS was performed and pertinent positives and negatives are included. ? ?Exam: ? ?Vitals:  ? 03/08/22 1337  ?BP: 126/84  ?Weight: 180 lb (81.6 kg)  ?Height: 5\' 3"  (1.6 m)  ? ? ?Body mass index is 31.89 kg/m?. ? ?General appearance:  Normal ?Thyroid:  Symmetrical, normal in size, without palpable masses or nodularity. ?Respiratory ? Auscultation:  Clear without wheezing or rhonchi ?Cardiovascular ? Auscultation:  Regular rate, without rubs, murmurs or gallops ? Edema/varicosities:  Not grossly evident ?Abdominal ? Soft,nontender, without masses, guarding or rebound. ? Liver/spleen:  No organomegaly noted ? Hernia:  None appreciated ? Skin ? Inspection:  Grossly normal ?  ?Breasts: Examined lying and sitting.  ? Right: Without masses, retractions, discharge or axillary adenopathy. ? ? Left: Without masses, retractions, discharge or axillary adenopathy. ?Genitourinary  ? Inguinal/mons:  Normal without inguinal adenopathy ? External genitalia:  Normal appearing vulva with no masses, tenderness, or  lesions ? BUS/Urethra/Skene's glands:  Normal ? Vagina:  Discharge present, no erythema ? Cervix:  Normal appearing without discharge or lesions ? Uterus:  Normal in size, shape and contour.  Midline and mobile, nontender ? Adnexa/parametria:   ?  Rt: Normal in size, without masses or tenderness. ?  Lt: Normal in size, without masses or tenderness. ? Anus and perineum: Normal ? Digital rectal exam: Not indicated ? ?Patient informed chaperone available to be present for breast and pelvic exam. Patient has requested no chaperone to be present. Patient has been advised what will be completed during breast and pelvic exam.  ? ?Wet prep + yeast ? ?Assessment/Plan:  31 y.o. 33 for breast and pelvic exam.  ? ?Well female exam with routine gynecological exam - Plan: Cytology - PAP( [place]). Education provided on SBEs, importance of preventative screenings, current guidelines, high calcium diet, regular exercise, and multivitamin daily. Labs with rheumatology.  ? ?Vaginal discharge - Plan: WET PREP FOR TRICH, YEAST, CLUE. Positive for yeast.  ? ?Vaginal candidiasis - Plan: fluconazole (DIFLUCAN) 150 MG tablet today and repeat in 3 days. #3 refills provided since she will be on antibiotics until June. She knows to take only if symptoms are present.  ? ?Screening for cervical cancer - Normal pap history. Pap today.  ? ?Follow up in 1 year for annual.  ? ? ? ? ? ?[facility] [place], 1:52 PM 03/08/2022 ? ?

## 2022-03-09 LAB — CYTOLOGY - PAP
Adequacy: ABSENT
Diagnosis: NEGATIVE

## 2022-03-29 ENCOUNTER — Emergency Department (HOSPITAL_COMMUNITY)
Admission: EM | Admit: 2022-03-29 | Discharge: 2022-03-29 | Disposition: A | Discharge status: Home / Self Care | Payer: No Typology Code available for payment source | Attending: Student | Admitting: Student

## 2022-03-29 ENCOUNTER — Emergency Department (HOSPITAL_COMMUNITY): Payer: No Typology Code available for payment source

## 2022-03-29 DIAGNOSIS — M25552 Pain in left hip: Secondary | ICD-10-CM | POA: Diagnosis not present

## 2022-03-29 DIAGNOSIS — I10 Essential (primary) hypertension: Secondary | ICD-10-CM | POA: Diagnosis not present

## 2022-03-29 DIAGNOSIS — Y92410 Unspecified street and highway as the place of occurrence of the external cause: Secondary | ICD-10-CM | POA: Insufficient documentation

## 2022-03-29 DIAGNOSIS — Z79899 Other long term (current) drug therapy: Secondary | ICD-10-CM | POA: Diagnosis not present

## 2022-03-29 DIAGNOSIS — Z87891 Personal history of nicotine dependence: Secondary | ICD-10-CM | POA: Diagnosis not present

## 2022-03-29 DIAGNOSIS — R519 Headache, unspecified: Secondary | ICD-10-CM | POA: Insufficient documentation

## 2022-03-29 DIAGNOSIS — R0781 Pleurodynia: Secondary | ICD-10-CM | POA: Insufficient documentation

## 2022-03-29 DIAGNOSIS — M542 Cervicalgia: Secondary | ICD-10-CM | POA: Diagnosis present

## 2022-03-29 LAB — PREGNANCY, URINE: Preg Test, Ur: NEGATIVE

## 2022-03-29 MED ORDER — KETOROLAC TROMETHAMINE 15 MG/ML IJ SOLN
15.0000 mg | Freq: Once | INTRAMUSCULAR | Status: AC
Start: 2022-03-29 — End: 2022-03-29
  Administered 2022-03-29: 15 mg via INTRAMUSCULAR
  Filled 2022-03-29: qty 1

## 2022-03-29 MED ORDER — ACETAMINOPHEN 500 MG PO TABS
1000.0000 mg | ORAL_TABLET | Freq: Once | ORAL | Status: AC
  Administered 2022-03-29: 1000 mg via ORAL
  Filled 2022-03-29: qty 2

## 2022-03-29 MED ORDER — OXYCODONE HCL 5 MG PO TABS: 5.0000 mg | ORAL_TABLET | ORAL | 0 refills | Status: DC | PRN

## 2022-03-29 MED ORDER — OXYCODONE-ACETAMINOPHEN 5-325 MG PO TABS: 1.0000 | ORAL_TABLET | Freq: Once | ORAL | Status: DC

## 2022-03-29 MED ORDER — LIDOCAINE 5 % EX PTCH
1.0000 | MEDICATED_PATCH | CUTANEOUS | Status: DC
  Administered 2022-03-29: 1 via TRANSDERMAL
  Filled 2022-03-29: qty 1

## 2022-03-29 MED ORDER — OXYCODONE HCL 5 MG PO TABS
5.0000 mg | ORAL_TABLET | Freq: Once | ORAL | Status: AC
  Administered 2022-03-29: 5 mg via ORAL
  Filled 2022-03-29: qty 1
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
## 2022-03-29 NOTE — ED Notes (Signed)
Pt called for triage and no answer 
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
## 2022-04-13 ENCOUNTER — Encounter: Payer: Self-pay | Admitting: Nurse Practitioner

## 2022-04-13 ENCOUNTER — Ambulatory Visit (INDEPENDENT_AMBULATORY_CARE_PROVIDER_SITE_OTHER): Payer: Medicare Other | Admitting: Nurse Practitioner

## 2022-04-13 VITALS — BP 120/74

## 2022-04-13 DIAGNOSIS — N760 Acute vaginitis: Secondary | ICD-10-CM | POA: Diagnosis not present

## 2022-04-13 DIAGNOSIS — B9689 Other specified bacterial agents as the cause of diseases classified elsewhere: Secondary | ICD-10-CM

## 2022-04-13 DIAGNOSIS — R300 Dysuria: Secondary | ICD-10-CM

## 2022-04-13 DIAGNOSIS — N898 Other specified noninflammatory disorders of vagina: Secondary | ICD-10-CM | POA: Diagnosis not present

## 2022-04-13 LAB — WET PREP FOR TRICH, YEAST, CLUE

## 2022-04-13 MED ORDER — METRONIDAZOLE 0.75 % VA GEL: 1.0000 | Freq: Every day | VAGINAL | 0 refills | Status: AC

## 2022-04-13 MED ORDER — AZO BORIC ACID 600 MG VA SUPP: 1.0000 | VAGINAL | 1 refills | Status: DC

## 2022-04-13 MED ORDER — FLUCONAZOLE 150 MG PO TABS: 150.0000 mg | ORAL_TABLET | ORAL | 0 refills | Status: DC

## 2022-04-13 NOTE — Progress Notes (Signed)
   Acute Office Visit  Subjective:    [name], female    DOB: 12-01-1990, 31 y.o.   MRN: [id number]   HPI 31 y.o. presents today for burning with urination, vaginal discharge and itching that started 4 days ago. H/O recurrent yeast and BV. She feels symptoms are similar to BV. Denies need for STD screening.    Review of Systems  Constitutional: Negative.   Genitourinary:  Positive for dysuria and vaginal discharge. Negative for flank pain, frequency, genital sores, hematuria and urgency.       Vaginal itching      Objective:    Physical Exam Constitutional:      Appearance: Normal appearance.  Genitourinary:    General: Normal vulva.     Vagina: Vaginal discharge present. No erythema.     Cervix: Normal.    BP 120/74   LMP 03/30/2022  Wt Readings from Last 3 Encounters:  03/29/22 181 lb (82.1 kg)  03/08/22 180 lb (81.6 kg)  08/10/21 175 lb (79.4 kg)        Patient informed chaperone available to be present for breast and pelvic exam. Patient has requested no chaperone to be present. Patient has been advised what will be completed during breast and pelvic exam.   Wet prep negative UA negative  Assessment & Plan:   Problem List Items Addressed This Visit   None Visit Diagnoses     Bacterial vaginosis    -  Primary   Relevant Medications   metroNIDAZOLE (METROGEL) 0.75 % vaginal gel   Boric Acid Vaginal (AZO BORIC ACID) 600 MG SUPP (Start on 04/14/2022)   fluconazole (DIFLUCAN) 150 MG tablet   Vaginal discharge       Relevant Medications   fluconazole (DIFLUCAN) 150 MG tablet   Other Relevant Orders   WET PREP FOR [place], YEAST, CLUE (Completed)   Burning with urination       Relevant Orders   Urinalysis,Complete w/RFL Culture (Completed)   Urine Culture   REFLEXIVE URINE CULTURE (Completed)      Plan: Clinical BV - Metrogel 0.75% nightly x 5 nights. After completing this she will begin boric acid suppositories twice weekly. UA  unremarkable, reflex culture pending.      Tamela Gammon DNP, 9:46 AM 04/13/2022

## 2022-04-15 LAB — URINALYSIS, COMPLETE W/RFL CULTURE
Bilirubin Urine: NEGATIVE
Glucose, UA: NEGATIVE
Hyaline Cast: NONE SEEN /LPF
Ketones, ur: NEGATIVE
Leukocyte Esterase: NEGATIVE
Nitrites, Initial: NEGATIVE
Protein, ur: NEGATIVE
Specific Gravity, Urine: 1.025 (ref 1.001–1.035)
WBC, UA: NONE SEEN /HPF (ref 0–5)
pH: 5.5 (ref 5.0–8.0)

## 2022-04-15 LAB — CULTURE INDICATED

## 2022-04-15 LAB — URINE CULTURE
MICRO NUMBER:: 13439364
Result:: NO GROWTH
SPECIMEN QUALITY:: ADEQUATE

## 2022-04-26 ENCOUNTER — Encounter: Payer: Self-pay | Admitting: Nurse Practitioner

## 2022-04-27 ENCOUNTER — Other Ambulatory Visit: Payer: Self-pay

## 2022-04-27 DIAGNOSIS — B9689 Other specified bacterial agents as the cause of diseases classified elsewhere: Secondary | ICD-10-CM

## 2022-04-27 MED ORDER — NONFORMULARY OR COMPOUNDED ITEM: 1 refills | Status: DC

## 2022-04-27 NOTE — Telephone Encounter (Signed)
FYI. [facility] cancelled pt's boric acid suppositories due to them being compounded so made sure it was discontinued on our end and called it in to [facility] per pt's request.

## 2022-05-03 ENCOUNTER — Encounter (HOSPITAL_COMMUNITY): Payer: Self-pay

## 2022-05-03 ENCOUNTER — Ambulatory Visit (HOSPITAL_COMMUNITY)
Admission: EM | Admit: 2022-05-03 | Discharge: 2022-05-03 | Disposition: A | Discharge status: Home / Self Care | Payer: Medicare Other | Attending: Emergency Medicine | Admitting: Emergency Medicine

## 2022-05-03 DIAGNOSIS — J069 Acute upper respiratory infection, unspecified: Secondary | ICD-10-CM | POA: Diagnosis not present

## 2022-05-03 MED ORDER — FLUTICASONE PROPIONATE 50 MCG/ACT NA SUSP: 1.0000 | Freq: Every day | NASAL | 2 refills | Status: DC

## 2022-05-03 MED ORDER — BENZONATATE 100 MG PO CAPS: 100.0000 mg | ORAL_CAPSULE | Freq: Three times a day (TID) | ORAL | 0 refills | Status: AC

## 2022-05-03 NOTE — ED Triage Notes (Signed)
Pt c/o cough and congestion x1wk. States using OTC with no relief and upped her prednisone with no relief.

## 2022-05-03 NOTE — ED Provider Notes (Signed)
[facility]    CSN: [id number] Arrival date & time: 05/03/22  1154      History   Chief Complaint Chief Complaint  Patient presents with   Cough    HPI Kayla [name] is a 31 y.o. female.  Presents with 1 week history of nasal congestion, runny nose, cough.  Daughter was sick with same symptoms last week. Denies fever, chills, abdominal pain vomiting/diarrhea.  She does note some pressure in her ears.  She has been trying over-the-counter cough syrup and occasional decongestant.  Tried her daughter's Flonase.  Throat irritation from cough is resolved with ibuprofen and Tylenol. She also takes prednisone and was advised to increase her dose.  Denies any wheezing, shortness of breath, trouble breathing.  History of lupus with lung complications.  Past Medical History:  Diagnosis Date   Acute respiratory failure with hypoxemia ([facility]) 09/19/2017   Acute respiratory failure with hypoxia ([facility])    Arthritis    "hands and legs" (01/08/2015)   CAP (community acquired pneumonia) 01/07/2015   Chronic Respiratory failure with oxygen requirement affecting pregnancy, antepartum 05/16/2016   Resolved pulmonary HTN on 2018 echo (in our epic).   Pt off O2 in 2018.   Daily headache    "sometimes" (01/08/2015)   GERD (gastroesophageal reflux disease)    Hypertension    No longer takes meds   Lung disease    Lupus ([facility])    Multifocal pneumonia    Pulmonary hypertension ([facility])    Sjogren's syndrome ([facility])    STD (sexually transmitted disease)    Chlamydia    Patient Active Problem List   Diagnosis Date Noted   Gastroesophageal reflux disease without esophagitis 07/13/2020   ILD (interstitial lung disease) ([facility])    History of IUFD 02/14/2017   SS-A antibody positive 06/02/2016   SS-B antibody positive 06/02/2016   Chronic hypertension 03/14/2016   Other secondary pulmonary hypertension ([facility]) 09/04/2015   Lupus (systemic lupus erythematosus) ([facility]) 08/21/2015   Loud P2  (pulmonary S2, second heart sound) 08/21/2015   Insomnia 08/21/2015   Systemic lupus erythematosus (SLE) affecting pregnancy, antepartum ([facility]) 07/06/2015   Sjogren's syndrome ([facility]) 04/28/2015    Past Surgical History:  Procedure Laterality Date   CARDIAC CATHETERIZATION N/A 09/09/2015   Procedure: Right Heart Cath;  Surgeon: Laurey Morale, MD;  Location: [facility];  Service: Cardiovascular;  Laterality: N/A;   DILATION AND EVACUATION N/A 07/13/2015   Procedure: DILATATION AND EVACUATION;  Surgeon: Levie Heritage, DO;  Location: [facility];  Service: Gynecology;  Laterality: N/A;   FINGER SURGERY Right 03/2014   "laceration, nerve/artery injury" 2nd digit   VIDEO BRONCHOSCOPY Bilateral 01/12/2015   Procedure: VIDEO BRONCHOSCOPY WITH FLUORO;  Surgeon: Leslye Peer, MD;  Location: [facility];  Service: Cardiopulmonary;  Laterality: Bilateral;    OB History     Gravida  4   Para  1   Term  1   Preterm  0   AB  2   Living  1      SAB  2   IAB  0   Ectopic  0   Multiple  0   Live Births  1            Home Medications    Prior to Admission medications   Medication Sig Start Date End Date Taking? Authorizing Provider  benzonatate (TESSALON) 100 MG capsule Take 1 capsule (100 mg total) by mouth 3 (three) times daily for 5 days. 05/03/22 05/08/22  Yes Levii Hairfield, PA-C  fluticasone (FLONASE) 50 MCG/ACT nasal spray Place 1 spray into both nostrils daily. 05/03/22  Yes Dawnell Bryant, Lurena Joiner, PA-C  albuterol (PROVENTIL HFA;VENTOLIN HFA) 108 (90 Base) MCG/ACT inhaler Inhale 2 puffs into the lungs every 4 (four) hours as needed for wheezing or shortness of breath. 06/09/17   Adam Phenix, MD  amoxicillin (AMOXIL) 500 MG capsule Take 500 mg by mouth every 8 (eight) hours. 03/17/22   [provider]  azaTHIOprine (IMURAN) 50 MG tablet Take by mouth. 11/26/20   [provider]  fluconazole (DIFLUCAN) 150 MG tablet Take 1 tablet (150 mg total) by mouth  every 3 (three) days. 04/13/22   [name], NP  hydroxychloroquine (PLAQUENIL) 200 MG tablet Take 400 mg by mouth daily.    [provider]  ibuprofen (ADVIL) 600 MG tablet Take 1 tablet (600 mg total) by mouth every 6 (six) hours as needed. 08/06/21   Domenick Gong, MD  ketoconazole (NIZORAL) 2 % cream SMARTSIG:1 Application Topical 1 to 2 Times Daily 04/05/21   [provider]  NONFORMULARY OR COMPOUNDED ITEM Boric Acid Vaginal Suppositories 600mg --insert one suppository twice weekly. 04/27/22   06/27/22, NP  nystatin-triamcinolone ointment (MYCOLOG) Apply 1 application topically 2 (two) times daily. 07/20/21   07/22/21, Ardell Isaacs, MD  predniSONE (DELTASONE) 5 MG tablet Take 5 mg by mouth daily with breakfast.    [provider]  sertraline (ZOLOFT) 50 MG tablet Take by mouth. 02/15/21 02/15/22  [provider]  tiZANidine (ZANAFLEX) 4 MG tablet Take 1 tablet (4 mg total) by mouth every 8 (eight) hours as needed for muscle spasms. Patient not taking: Reported on 04/13/2022 08/06/21   08/08/21, MD  zolpidem (AMBIEN) 10 MG tablet Take 10 mg by mouth at bedtime as needed. Patient not taking: Reported on 03/08/2022 06/25/21   [provider]  amLODipine (NORVASC) 5 MG tablet Take 1 tablet (5 mg total) by mouth daily. 09/22/17 11/09/20  11/11/20, NP    Family History Family History  Problem Relation Age of Onset   Diabetes Mother    Arthritis Father    Cancer Brother        Found in jaw area   Diabetes Maternal Aunt    Diabetes Maternal Grandmother     Social History Social History   Tobacco Use   Smoking status: Former    Packs/day: 0.10    Years: 5.00    Total pack years: 0.50    Types: Cigarettes    Quit date: 11/20/2014    Years since quitting: 7.4   Smokeless tobacco: Never  Vaping Use   Vaping Use: Never used  Substance Use Topics   Alcohol use: Yes    Comment: Occas   Drug use: No      Allergies   Hydrocodone and Zithromax [azithromycin]   Review of Systems Review of Systems  Respiratory:  Positive for cough.    Per HPI  Physical Exam Triage Vital Signs ED Triage Vitals  Enc Vitals Group     BP 05/03/22 1231 124/71     Pulse Rate 05/03/22 1231 98     Resp 05/03/22 1231 18     Temp 05/03/22 1231 98.5 F (36.9 C)     Temp src --      SpO2 05/03/22 1231 98 %     Weight --      Height --      Head Circumference --  Peak Flow --      Pain Score 05/03/22 1232 5     Pain Loc --      Pain Edu? --      Excl. in GC? --    No data found.  Updated Vital Signs BP 124/71 (BP Location: Left Arm)   Pulse 98   Temp 98.5 F (36.9 C)   Resp 18   LMP 04/06/2022   SpO2 98%    Physical Exam Vitals and nursing note reviewed.  Constitutional:      General: She is not in acute distress. HENT:     Right Ear: Tympanic membrane and ear canal normal.     Left Ear: Tympanic membrane and ear canal normal.     Nose: Congestion present.     Mouth/Throat:     Mouth: Mucous membranes are moist.     Pharynx: Uvula midline. No oropharyngeal exudate or posterior oropharyngeal erythema.     Tonsils: No tonsillar exudate or tonsillar abscesses.  Eyes:     Conjunctiva/sclera: Conjunctivae normal.     Pupils: Pupils are equal, round, and reactive to light.  Cardiovascular:     Rate and Rhythm: Normal rate and regular rhythm.     Heart sounds: Normal heart sounds.  Pulmonary:     Effort: Pulmonary effort is normal. No respiratory distress.     Breath sounds: Normal breath sounds. No wheezing, rhonchi or rales.  Abdominal:     General: Bowel sounds are normal.     Palpations: Abdomen is soft.     Tenderness: There is no abdominal tenderness.  Musculoskeletal:        General: Normal range of motion.     Cervical back: Normal range of motion.  Lymphadenopathy:     Cervical: No cervical adenopathy.  Skin:    General: Skin is warm and dry.  Neurological:      Mental Status: She is alert and oriented to person, place, and time.     UC Treatments / Results  Labs (all labs ordered are listed, but only abnormal results are displayed) Labs Reviewed - No data to display  EKG  Radiology No results found.  Procedures Procedures (including critical care time)  Medications Ordered in UC Medications - No data to display  Initial Impression / Assessment and Plan / UC Course  I have reviewed the triage vital signs and the nursing notes.  Pertinent labs & imaging results that were available during my care of the patient were reviewed by me and considered in my medical decision making (see chart for details).  Exam is overall reassuring.  Discussed symptomatic treatment with patient.  I also will send in cough medicine to use 3 times daily as needed.  Patient understands to return in 1 week if she has continued symptoms even with medication use. Return precautions discussed. Patient agrees to plan and is discharged in stable condition.  Final Clinical Impressions(s) / UC Diagnoses   Final diagnoses:  Viral URI with cough     Discharge Instructions      Try daily decongestant with nightly decongestant (Mucinex, benadryl).  You can take the cough medicine 3 times a day.  Use the nasal spray daily.  Use warm cloth or hot steamy shower to loosen mucous.   Prop yourself up at night to help congestion and drainage!  Please return to the urgent care or emergency department if symptoms worsen or do not improve.    ED Prescriptions     Medication  Sig Dispense Auth. Provider   benzonatate (TESSALON) 100 MG capsule Take 1 capsule (100 mg total) by mouth 3 (three) times daily for 5 days. 15 capsule Merdith Adan, PA-C   fluticasone (FLONASE) 50 MCG/ACT nasal spray Place 1 spray into both nostrils daily. 11.1 mL Karne Ozga, Lurena Joiner, PA-C      PDMP not reviewed this encounter.   Elie Gragert, Lurena Joiner, PA-C 05/03/22 1308

## 2022-05-03 NOTE — Discharge Instructions (Addendum)
Try daily decongestant with nightly decongestant (Mucinex, benadryl).  You can take the cough medicine 3 times a day.  Use the nasal spray daily.  Use warm cloth or hot steamy shower to loosen mucous.   Prop yourself up at night to help congestion and drainage!  Please return to the urgent care or emergency department if symptoms worsen or do not improve.

## 2022-07-10 ENCOUNTER — Encounter (HOSPITAL_COMMUNITY): Payer: Self-pay | Admitting: Emergency Medicine

## 2022-07-10 ENCOUNTER — Ambulatory Visit (HOSPITAL_COMMUNITY)
Admission: EM | Admit: 2022-07-10 | Discharge: 2022-07-10 | Disposition: A | Discharge status: Home / Self Care | Payer: Medicare Other | Attending: Physician Assistant | Admitting: Physician Assistant

## 2022-07-10 DIAGNOSIS — B9689 Other specified bacterial agents as the cause of diseases classified elsewhere: Secondary | ICD-10-CM | POA: Insufficient documentation

## 2022-07-10 DIAGNOSIS — N898 Other specified noninflammatory disorders of vagina: Secondary | ICD-10-CM | POA: Diagnosis present

## 2022-07-10 DIAGNOSIS — N760 Acute vaginitis: Secondary | ICD-10-CM | POA: Insufficient documentation

## 2022-07-10 MED ORDER — NONFORMULARY OR COMPOUNDED ITEM: 1 refills | Status: DC

## 2022-07-10 MED ORDER — METRONIDAZOLE 0.75 % VA GEL: 1.0000 | Freq: Two times a day (BID) | VAGINAL | 1 refills | Status: DC

## 2022-07-10 NOTE — Discharge Instructions (Addendum)
Labs will be completed in 24 to 48 hours, if you do not get a call from this office that indicates that his lab tests are negative.  You can go on MyChart to review the test results at 24 to 48 hours.

## 2022-07-10 NOTE — ED Provider Notes (Signed)
[facility]    CSN: [id number] Arrival date & time: 07/10/22  1157      History   Chief Complaint Chief Complaint  Patient presents with   Vaginal Discharge    HPI Kayla [name] is a 31 y.o. female.   31 year old female presents with vaginal discharge.  Patient indicates that she has lupus and has take prednisone on a regular basis and this makes her more prone to having vaginal yeast infections.  Patient indicates that she does take a boric acid vaginal suppository and a Metro vaginal gel intermittently to help prevent occurrences of yeast and bacterial vaginosis.  She has ran out of these preventative and treatment medications.  Patient indicates for the past several days she has been having vaginal discharge that has been creamy white, with itching and odor associated.  Patient denies any fever or chills but she does have some mild intermittent back pain.  She does not have any frequency, urgency, or dysuria.  Patient indicates that her last period was 2 weeks ago and was normal.   Vaginal Discharge   Past Medical History:  Diagnosis Date   Acute respiratory failure with hypoxemia (HCC) 09/19/2017   Acute respiratory failure with hypoxia (HCC)    Arthritis    "hands and legs" (01/08/2015)   CAP (community acquired pneumonia) 01/07/2015   Chronic Respiratory failure with oxygen requirement affecting pregnancy, antepartum 05/16/2016   Resolved pulmonary HTN on 2018 echo (in our epic).   Pt off O2 in 2018.   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   Lupus (systemic lupus erythematosus) (HCC) 08/21/2015   Loud P2 (pulmonary S2, second heart sound) 08/21/2015   Insomnia 08/21/2015   Systemic lupus erythematosus (SLE) affecting pregnancy, antepartum (HCC) 07/06/2015   Sjogren's syndrome (HCC) 04/28/2015    Past Surgical History:  Procedure Laterality Date   CARDIAC CATHETERIZATION N/A 09/09/2015   Procedure: Right Heart Cath;  Surgeon: Laurey Morale, MD;  Location: [facility] INVASIVE CV LAB;  Service: Cardiovascular;  Laterality: N/A;   DILATION AND EVACUATION N/A 07/13/2015   Procedure: DILATATION AND EVACUATION;  Surgeon: Levie Heritage, DO;  Location: [facility] ORS;  Service: Gynecology;  Laterality: N/A;   FINGER SURGERY Right 03/2014   "laceration, nerve/artery injury" 2nd digit   VIDEO BRONCHOSCOPY Bilateral 01/12/2015   Procedure: VIDEO BRONCHOSCOPY WITH FLUORO;  Surgeon: Leslye Peer, MD;  Location: [facility] ENDOSCOPY;  Service: Cardiopulmonary;  Laterality: Bilateral;    OB History     Gravida  4   Para  1   Term  1   Preterm  0   AB  2   Living  1      SAB  2   IAB  0   Ectopic  0   Multiple  0   Live Births  1  Home Medications    Prior to Admission medications   Medication Sig Start Date End Date Taking? Authorizing Provider  metroNIDAZOLE (METROGEL VAGINAL) 0.75 % vaginal gel Place 1 Applicatorful vaginally 2 (two) times daily. 07/10/22  Yes Nyoka Lint, PA-C  albuterol (PROVENTIL HFA;VENTOLIN HFA) 108 (90 Base) MCG/ACT inhaler Inhale 2 puffs into the lungs every 4 (four) hours as needed for wheezing or shortness of breath. 06/09/17   Woodroe Mode, MD  amoxicillin (AMOXIL) 500 MG capsule Take 500 mg by mouth every 8 (eight) hours. 03/17/22   [provider]  azaTHIOprine (IMURAN) 50 MG tablet Take  by mouth. 11/26/20   [provider]  fluconazole (DIFLUCAN) 150 MG tablet Take 1 tablet (150 mg total) by mouth every 3 (three) days. 04/13/22   Tamela Gammon, NP  fluticasone (FLONASE) 50 MCG/ACT nasal spray Place 1 spray into both nostrils daily. 05/03/22   Rising, Wells Guiles, PA-C  hydroxychloroquine (PLAQUENIL) 200 MG tablet Take 400 mg by mouth daily.    [provider]  ibuprofen (ADVIL) 600 MG tablet Take 1 tablet (600 mg total) by mouth every 6 (six) hours as needed. 08/06/21   Melynda Ripple, MD  ketoconazole (NIZORAL) 2 % cream SMARTSIG:1 Application Topical 1 to 2 Times Daily 04/05/21   [provider]  NONFORMULARY OR COMPOUNDED ITEM Boric Acid Vaginal Suppositories 600mg --insert one suppository twice weekly. 07/10/22   Nyoka Lint, PA-C  nystatin-triamcinolone ointment [facility]) Apply 1 application topically 2 (two) times daily. 07/20/21   Yisroel Ramming, Everardo All, MD  predniSONE (DELTASONE) 5 MG tablet Take 5 mg by mouth daily with breakfast.    [provider]  sertraline (ZOLOFT) 50 MG tablet Take by mouth. 02/15/21 02/15/22  [provider]  tiZANidine (ZANAFLEX) 4 MG tablet Take 1 tablet (4 mg total) by mouth every 8 (eight) hours as needed for muscle spasms. Patient not taking: Reported on 04/13/2022 08/06/21   Melynda Ripple, MD  zolpidem (AMBIEN) 10 MG tablet Take 10 mg by mouth at bedtime as needed. Patient not taking: Reported on 03/08/2022 06/25/21   [provider]  amLODipine (NORVASC) 5 MG tablet Take 1 tablet (5 mg total) by mouth daily. 09/22/17 11/09/20  Corey Harold, NP    Family History Family History  Problem Relation Age of Onset   Diabetes Mother    Arthritis Father    Cancer Brother        Found in jaw area   Diabetes Maternal Aunt    Diabetes Maternal Grandmother     Social History Social History   Tobacco Use   Smoking status: Former    Packs/day: 0.10    Years: 5.00    Total pack years: 0.50     Types: Cigarettes    Quit date: 11/20/2014    Years since quitting: 7.6   Smokeless tobacco: Never  Vaping Use   Vaping Use: Never used  Substance Use Topics   Alcohol use: Yes    Comment: Occas   Drug use: No     Allergies   Hydrocodone and Zithromax [azithromycin]   Review of Systems Review of Systems  Genitourinary:  Positive for vaginal discharge (creamy white with odor).     Physical Exam Triage Vital Signs ED Triage Vitals  Enc Vitals Group     BP 07/10/22 1222 (!) 116/58     Pulse Rate 07/10/22 1222 88     Resp 07/10/22 1222 17     Temp 07/10/22 1222 99.1 F (37.3  C)     Temp src --      SpO2 07/10/22 1222 96 %     Weight --      Height --      Head Circumference --      Peak Flow --      Pain Score 07/10/22 1220 0     Pain Loc --      Pain Edu? --      Excl. in GC? --    No data found.  Updated Vital Signs BP (!) 116/58 (BP Location: Left Arm)   Pulse 88   Temp 99.1 F (37.3 C)   Resp 17   LMP 06/30/2022   SpO2 96%   Visual Acuity Right Eye Distance:   Left Eye Distance:   Bilateral Distance:    Right Eye Near:   Left Eye Near:    Bilateral Near:     Physical Exam Constitutional:      Appearance: Normal appearance.  Abdominal:     General: Abdomen is flat. Bowel sounds are normal.     Palpations: Abdomen is soft.     Tenderness: There is no abdominal tenderness.  Neurological:     Mental Status: She is alert.      UC Treatments / Results  Labs (all labs ordered are listed, but only abnormal results are displayed) Labs Reviewed  CERVICOVAGINAL ANCILLARY ONLY    EKG   Radiology No results found.  Procedures Procedures (including critical care time)  Medications Ordered in UC Medications - No data to display  Initial Impression / Assessment and Plan / UC Course  I have reviewed the triage vital signs and the nursing notes.  Pertinent labs & imaging results that were available during my care of the patient were  reviewed by me and considered in my medical decision making (see chart for details).    Pain: 1.  A self vaginal swab and testing for yeast and BV is pending. 2.  Advised to follow-up with PCP return to urgent care if symptoms fail to improve. 3.  Refills for the boric acid vaginal suppositories and the Metro vaginal gel has been sent to the pharmacy. Final Clinical Impressions(s) / UC Diagnoses   Final diagnoses:  Vaginal itching  Vaginitis and vulvovaginitis     Discharge Instructions      Labs will be completed in 24 to 48 hours, if you do not get a call from this office that indicates that his lab tests are negative.  You can go on MyChart to review the test results at 24 to 48 hours.    ED Prescriptions     Medication Sig Dispense Auth. Provider   NONFORMULARY OR COMPOUNDED ITEM  (Status: Discontinued) Boric Acid Vaginal Suppositories 600mg --insert one suppository twice weekly. 24 each , PA-C   metroNIDAZOLE (METROGEL VAGINAL) 0.75 % vaginal gel Place 1 Applicatorful vaginally 2 (two) times daily. 70 g Ellsworth Lennox, PA-C   NONFORMULARY OR COMPOUNDED ITEM Boric Acid Vaginal Suppositories 600mg --insert one suppository twice weekly. 24 each Ellsworth Lennox, PA-C      PDMP not reviewed this encounter.   , PA-C 07/10/22 1338

## 2022-07-10 NOTE — ED Triage Notes (Signed)
Pt c/o vaginal discharge for about 3 days that is clear-whitish that has foul odor. Reports used some vaginal gel that already had but didn't have enough.

## 2022-07-11 LAB — CERVICOVAGINAL ANCILLARY ONLY
Bacterial Vaginitis (gardnerella): NEGATIVE
Candida Glabrata: NEGATIVE
Candida Vaginitis: NEGATIVE
Chlamydia: NEGATIVE
Comment: NEGATIVE
Comment: NEGATIVE
Comment: NEGATIVE
Comment: NEGATIVE
Comment: NEGATIVE
Comment: NORMAL
Neisseria Gonorrhea: NEGATIVE
Trichomonas: NEGATIVE

## 2022-08-01 ENCOUNTER — Encounter: Payer: Self-pay | Admitting: Nurse Practitioner

## 2022-08-01 ENCOUNTER — Ambulatory Visit (INDEPENDENT_AMBULATORY_CARE_PROVIDER_SITE_OTHER): Payer: Medicare Other | Admitting: Nurse Practitioner

## 2022-08-01 VITALS — BP 92/64 | HR 98

## 2022-08-01 DIAGNOSIS — Z202 Contact with and (suspected) exposure to infections with a predominantly sexual mode of transmission: Secondary | ICD-10-CM | POA: Diagnosis not present

## 2022-08-01 DIAGNOSIS — Z113 Encounter for screening for infections with a predominantly sexual mode of transmission: Secondary | ICD-10-CM | POA: Diagnosis not present

## 2022-08-01 LAB — WET PREP FOR TRICH, YEAST, CLUE

## 2022-08-01 NOTE — Progress Notes (Signed)
   Acute Office Visit  Subjective:    [name], female    DOB: 11-25-1990, 31 y.o.   MRN: [id number]   HPI 31 y.o. presents today for STD screening. Partner notified her this morning that he was positive for trichomonas. Last sexual encounter 2 weeks ago. Denies symptoms.    Review of Systems  Constitutional: Negative.   Genitourinary: Negative.        Objective:    Physical Exam Constitutional:      Appearance: Normal appearance.  Genitourinary:    General: Normal vulva.     Vagina: Normal.     Cervix: Normal.     BP 92/64   Pulse 98   LMP 07/24/2022 (Exact Date)   SpO2 94%  Wt Readings from Last 3 Encounters:  03/29/22 181 lb (82.1 kg)  03/08/22 180 lb (81.6 kg)  08/10/21 175 lb (79.4 kg)        Patient informed chaperone available to be present for breast and/or pelvic exam. Patient has requested no chaperone to be present. Patient has been advised what will be completed during breast and pelvic exam.   Wet prep negative  Assessment & Plan:   Problem List Items Addressed This Visit   None Visit Diagnoses     Screening examination for STD (sexually transmitted disease)    -  Primary   Relevant Orders   SURESWAB CT/NG/T. vaginalis   Exposure to trichomonas       Relevant Orders   WET PREP FOR TRICH, YEAST, CLUE   SURESWAB CT/NG/T. vaginalis      Plan: Negative wet prep and exam. Will send off [facility] for Chlamydia, gonorrhea, and trich. Declines HIV/RPR.      [name] DNP, 4:12 PM 08/01/2022

## 2022-08-02 LAB — SURESWAB CT/NG/T. VAGINALIS
C. trachomatis RNA, TMA: NOT DETECTED
N. gonorrhoeae RNA, TMA: NOT DETECTED
Trichomonas vaginalis RNA: DETECTED — AB

## 2022-08-03 ENCOUNTER — Other Ambulatory Visit: Payer: Self-pay | Admitting: Nurse Practitioner

## 2022-08-03 ENCOUNTER — Encounter: Payer: Self-pay | Admitting: Nurse Practitioner

## 2022-08-03 DIAGNOSIS — R110 Nausea: Secondary | ICD-10-CM

## 2022-08-03 DIAGNOSIS — A599 Trichomoniasis, unspecified: Secondary | ICD-10-CM

## 2022-08-03 MED ORDER — ONDANSETRON HCL 4 MG PO TABS: 4.0000 mg | ORAL_TABLET | Freq: Three times a day (TID) | ORAL | 0 refills | Status: DC | PRN

## 2022-08-03 MED ORDER — METRONIDAZOLE 500 MG PO TABS: 500.0000 mg | ORAL_TABLET | Freq: Two times a day (BID) | ORAL | 0 refills | Status: DC

## 2022-08-09 ENCOUNTER — Other Ambulatory Visit: Payer: Self-pay | Admitting: Nurse Practitioner

## 2022-08-09 DIAGNOSIS — B3731 Acute candidiasis of vulva and vagina: Secondary | ICD-10-CM

## 2022-08-09 MED ORDER — FLUCONAZOLE 150 MG PO TABS: 150.0000 mg | ORAL_TABLET | ORAL | 0 refills | Status: DC

## 2022-08-14 ENCOUNTER — Emergency Department (HOSPITAL_COMMUNITY)
Admission: EM | Admit: 2022-08-14 | Discharge: 2022-08-15 | Disposition: A | Discharge status: Home / Self Care | Payer: Medicare Other | Attending: Emergency Medicine | Admitting: Emergency Medicine

## 2022-08-14 ENCOUNTER — Other Ambulatory Visit: Payer: Self-pay

## 2022-08-14 ENCOUNTER — Emergency Department (HOSPITAL_COMMUNITY): Payer: Medicare Other

## 2022-08-14 ENCOUNTER — Encounter (HOSPITAL_COMMUNITY): Payer: Self-pay | Admitting: Emergency Medicine

## 2022-08-14 DIAGNOSIS — M549 Dorsalgia, unspecified: Secondary | ICD-10-CM

## 2022-08-14 DIAGNOSIS — R42 Dizziness and giddiness: Secondary | ICD-10-CM | POA: Diagnosis not present

## 2022-08-14 DIAGNOSIS — M546 Pain in thoracic spine: Secondary | ICD-10-CM | POA: Insufficient documentation

## 2022-08-14 DIAGNOSIS — Z20822 Contact with and (suspected) exposure to covid-19: Secondary | ICD-10-CM | POA: Diagnosis not present

## 2022-08-14 LAB — CBC
HCT: 42.6 % (ref 36.0–46.0)
Hemoglobin: 13.4 g/dL (ref 12.0–15.0)
MCH: 26.5 pg (ref 26.0–34.0)
MCHC: 31.5 g/dL (ref 30.0–36.0)
MCV: 84.4 fL (ref 80.0–100.0)
Platelets: UNDETERMINED 10*3/uL (ref 150–400)
RBC: 5.05 MIL/uL (ref 3.87–5.11)
RDW: 12.9 % (ref 11.5–15.5)
WBC: 7.7 10*3/uL (ref 4.0–10.5)
nRBC: 0 % (ref 0.0–0.2)

## 2022-08-14 LAB — TROPONIN I (HIGH SENSITIVITY)
Troponin I (High Sensitivity): 3 ng/L (ref <18)
Troponin I (High Sensitivity): <2 ng/L (ref <18)

## 2022-08-14 LAB — D-DIMER, QUANTITATIVE: D-Dimer, Quant: 0.52 ug/mL-FEU — ABNORMAL HIGH (ref 0.00–0.50)

## 2022-08-14 LAB — BASIC METABOLIC PANEL
Anion gap: 9 (ref 5–15)
BUN: 13 mg/dL (ref 6–20)
CO2: 23 mmol/L (ref 22–32)
Calcium: 9.1 mg/dL (ref 8.9–10.3)
Chloride: 105 mmol/L (ref 98–111)
Creatinine, Ser: 0.73 mg/dL (ref 0.44–1.00)
GFR, Estimated: >60 mL/min (ref >60)
Glucose, Bld: 98 mg/dL (ref 70–99)
Potassium: 4.1 mmol/L (ref 3.5–5.1)
Sodium: 137 mmol/L (ref 135–145)

## 2022-08-14 LAB — I-STAT BETA HCG BLOOD, ED (MC, WL, AP ONLY): I-stat hCG, quantitative: <5 m[IU]/mL (ref <5)

## 2022-08-14 MED ORDER — ONDANSETRON 4 MG PO TBDP
4.0000 mg | ORAL_TABLET | Freq: Once | ORAL | Status: AC
  Administered 2022-08-14: 4 mg via ORAL
  Filled 2022-08-14: qty 1

## 2022-08-14 MED ORDER — OXYCODONE-ACETAMINOPHEN 5-325 MG PO TABS
1.0000 | ORAL_TABLET | Freq: Once | ORAL | Status: AC
  Administered 2022-08-14: 2 via ORAL
  Filled 2022-08-14: qty 2

## 2022-08-14 MED ORDER — IBUPROFEN 400 MG PO TABS
600.0000 mg | ORAL_TABLET | Freq: Once | ORAL | Status: DC
  Filled 2022-08-14: qty 1

## 2022-08-14 MED ORDER — ACETAMINOPHEN 325 MG PO TABS
650.0000 mg | ORAL_TABLET | Freq: Once | ORAL | Status: DC
  Filled 2022-08-14: qty 2

## 2022-08-14 NOTE — ED Triage Notes (Addendum)
Patient w/ mid back pain for 1 week but increasing pain noted for about 2 days this has increased in intensity. Patient complaining of chest tightness when breathing and feels that its hard to take a deep breath.  Patient with hx of lupus and sjrogens. Pt denies n/v/d., fevers chills. Pt Aox4. NAD at this time.

## 2022-08-14 NOTE — ED Provider Notes (Signed)
[facility] EMERGENCY DEPARTMENT Provider Note   CSN: [id number] Arrival date & time: 08/14/22  1438     History  Chief Complaint  Patient presents with   Back Pain    Kayla [name] is a 31 y.o. female. History of lupus, Sjogren, interstitial lung disease who presents to the emergency department with back pain.  Patient states that for 2 weeks she has had progressively worsening left-sided scapular back pain.  She states that it started off intermittent and has become constant.  She describes it as almost a throbbing and tight pain.  She states that it intermittently rates through the left scapula to the left chest wall.  She states that it is worse with rotation, taking a deep breath.  She has used ibuprofen and Percocet at home with no relief of pain.  She states that she did have mild relief with a heating pad but that it persisted.  She denies having chest pain when its not radiating and she denies palpitations.  She does endorse being lightheaded intermittently over the past week.  She denies feeling short of breath.  She states that she used to wear oxygen with her interstitial lung disease but has not in a long time.  She denies injury to her back.  Denies fevers, cough, nausea or diaphoresis.     Back Pain Associated symptoms: chest pain        Home Medications Prior to Admission medications   Medication Sig Start Date End Date Taking? Authorizing Provider  albuterol (PROVENTIL HFA;VENTOLIN HFA) 108 (90 Base) MCG/ACT inhaler Inhale 2 puffs into the lungs every 4 (four) hours as needed for wheezing or shortness of breath. 06/09/17  Yes Adam Phenix, MD  azaTHIOprine (IMURAN) 50 MG tablet Take 50 mg by mouth in the morning, at noon, and at bedtime. 11/26/20  Yes [provider]  ciprofloxacin (CILOXAN) 0.3 % ophthalmic solution Place 2 drops into both eyes daily. 08/03/22  Yes [provider]  hydroxychloroquine (PLAQUENIL) 200 MG tablet  Take 400 mg by mouth daily.   Yes [provider]  ibuprofen (ADVIL) 600 MG tablet Take 1 tablet (600 mg total) by mouth every 6 (six) hours as needed. Patient taking differently: Take 600 mg by mouth every 6 (six) hours as needed for mild pain. 08/06/21  Yes Domenick Gong, MD  NONFORMULARY OR COMPOUNDED ITEM Boric Acid Vaginal Suppositories 600mg --insert one suppository twice weekly. Patient taking differently: Place 1 suppository vaginally See admin instructions. Boric Acid Vaginal Suppositories 600mg --insert one suppository twice weekly. No specific days 07/10/22  Yes , PA-C  omeprazole (PRILOSEC) 20 MG capsule Take 20 mg by mouth daily as needed (heartburn). 06/06/22 06/06/23 Yes [provider]  ondansetron (ZOFRAN) 4 MG tablet Take 1 tablet (4 mg total) by mouth every 8 (eight) hours as needed for nausea or vomiting. 08/03/22  Yes 06/08/23 A, NP  predniSONE (DELTASONE) 5 MG tablet Take 5 mg by mouth daily with breakfast.   Yes [provider]  sertraline (ZOLOFT) 50 MG tablet Take 75 mg by mouth daily. 02/15/21 08/14/22 Yes [provider]  fluconazole (DIFLUCAN) 150 MG tablet Take 1 tablet (150 mg total) by mouth every 3 (three) days. Patient not taking: Reported on 08/14/2022 08/09/22   08/16/2022 A, NP  metroNIDAZOLE (FLAGYL) 500 MG tablet Take 1 tablet (500 mg total) by mouth 2 (two) times daily. Patient not taking: Reported on 08/14/2022 08/03/22   08/16/2022, NP  zolpidem 08/05/22)  10 MG tablet Take 10 mg by mouth at bedtime as needed. Patient not taking: Reported on 08/01/2022 06/25/21   [provider]  amLODipine (NORVASC) 5 MG tablet Take 1 tablet (5 mg total) by mouth daily. 09/22/17 11/09/20  Corey Harold, NP      Allergies    Hydrocodone and Zithromax [azithromycin]    Review of Systems   Review of Systems  Respiratory:  Negative for shortness of breath.   Cardiovascular:  Positive for chest pain.   Musculoskeletal:  Positive for back pain.  Neurological:  Positive for light-headedness.  All other systems reviewed and are negative.   Physical Exam Updated Vital Signs BP 114/61 (BP Location: Right Arm)   Pulse 62   Temp 98.3 F (36.8 C) (Oral)   Resp 17   LMP 07/30/2022 (Exact Date)   SpO2 100%  Physical Exam Vitals and nursing note reviewed.  Constitutional:      General: She is not in acute distress.    Appearance: Normal appearance. She is not ill-appearing or toxic-appearing.  HENT:     Head: Normocephalic.     Mouth/Throat:     Mouth: Mucous membranes are moist.     Pharynx: Oropharynx is clear.  Eyes:     General: No scleral icterus.    Extraocular Movements: Extraocular movements intact.  Cardiovascular:     Rate and Rhythm: Normal rate and regular rhythm.     Pulses: Normal pulses.     Heart sounds: No murmur heard. Pulmonary:     Effort: Pulmonary effort is normal. No respiratory distress.     Breath sounds: Normal breath sounds.  Abdominal:     General: Bowel sounds are normal.     Palpations: Abdomen is soft.  Musculoskeletal:        General: Normal range of motion.       Arms:     Cervical back: Neck supple.     Comments: She does have mild tenderness to palpation of the left scapula but this does not reproduce her symptoms  No rash  Skin:    General: Skin is warm and dry.     Capillary Refill: Capillary refill takes less than 2 seconds.     Findings: No rash.  Neurological:     General: No focal deficit present.     Mental Status: She is alert and oriented to person, place, and time. Mental status is at baseline.  Psychiatric:        Mood and Affect: Mood normal.        Behavior: Behavior normal.        Thought Content: Thought content normal.        Judgment: Judgment normal.     ED Results / Procedures / Treatments   Labs (all labs ordered are listed, but only abnormal results are displayed) Labs Reviewed  D-DIMER, QUANTITATIVE -  Abnormal; Notable for the following components:      Result Value   D-Dimer, Quant 0.52 (*)    All other components within normal limits  BASIC METABOLIC PANEL  CBC  I-STAT BETA HCG BLOOD, ED ([facility], [facility], [facility] ONLY)  TROPONIN I (HIGH SENSITIVITY)  TROPONIN I (HIGH SENSITIVITY)    EKG EKG Interpretation  Date/Time:  Sunday August 14 2022 15:32:08 EDT Ventricular Rate:  68 PR Interval:  148 QRS Duration: 88 QT Interval:  380 QTC Calculation: 404 R Axis:   59 Text Interpretation: Normal sinus rhythm with sinus arrhythmia Normal ECG No significant change  since last tracing Confirmed by Melene Plan [phone]) on 08/14/2022 10:12:49 PM  Radiology DG Chest 2 View  Result Date: 08/14/2022 CLINICAL DATA:  Chest pain. EXAM: CHEST - 2 VIEW COMPARISON:  03/29/2022 prior studies FINDINGS: The cardiomediastinal silhouette is unremarkable. Scarring within the mid lungs again noted. Mild peribronchial thickening is unchanged. There is no evidence of focal airspace disease, pulmonary edema, suspicious pulmonary nodule/mass, pleural effusion, or pneumothorax. No acute bony abnormalities are identified. IMPRESSION: No active cardiopulmonary disease. Electronically Signed   By: Harmon Pier M.D.   On: 08/14/2022 16:36    Procedures Procedures    Medications Ordered in ED Medications  oxyCODONE-acetaminophen (PERCOCET/ROXICET) 5-325 MG per tablet 1-2 tablet (2 tablets Oral Given 08/14/22 2317)  ondansetron (ZOFRAN-ODT) disintegrating tablet 4 mg (4 mg Oral Given 08/14/22 2331)    ED Course/ Medical Decision Making/ A&P                           Medical Decision Making Amount and/or Complexity of Data Reviewed Labs: ordered. Radiology: ordered.  Risk Prescription drug management.   Care of patient being handed off to [facility], PA-C.  Patient is pending CTA PE study.  Given her significant history and increased risk of PE, D-dimer was obtained which is slightly positive.  When she has a CTA PE  study performed if this is negative she can be discharged home with likely musculoskeletal pain.  Please see her note for completion of care.  Final Clinical Impression(s) / ED Diagnoses Final diagnoses:  None    Rx / DC Orders ED Discharge Orders     None         Cristopher Peru, PA-C 08/15/22 0004    Melene Plan, DO 08/15/22 1458

## 2022-08-15 ENCOUNTER — Emergency Department (HOSPITAL_COMMUNITY): Payer: Medicare Other

## 2022-08-15 DIAGNOSIS — M546 Pain in thoracic spine: Secondary | ICD-10-CM | POA: Diagnosis not present

## 2022-08-15 LAB — RESP PANEL BY RT-PCR (FLU A&B, COVID) ARPGX2
Influenza A by PCR: NEGATIVE
Influenza B by PCR: NEGATIVE
SARS Coronavirus 2 by RT PCR: NEGATIVE

## 2022-08-15 MED ORDER — ALUM & MAG HYDROXIDE-SIMETH 200-200-20 MG/5ML PO SUSP
30.0000 mL | Freq: Once | ORAL | Status: AC
  Administered 2022-08-15: 30 mL via ORAL
  Filled 2022-08-15: qty 30

## 2022-08-15 MED ORDER — LIDOCAINE 5 % EX PTCH: 1.0000 | MEDICATED_PATCH | Freq: Every day | CUTANEOUS | 0 refills | Status: DC | PRN

## 2022-08-15 MED ORDER — METHOCARBAMOL 500 MG PO TABS: 500.0000 mg | ORAL_TABLET | Freq: Three times a day (TID) | ORAL | 0 refills | Status: DC | PRN

## 2022-08-15 MED ORDER — LIDOCAINE 5 % EX PTCH
1.0000 | MEDICATED_PATCH | CUTANEOUS | Status: DC
  Administered 2022-08-15: 1 via TRANSDERMAL
  Filled 2022-08-15: qty 1

## 2022-08-15 MED ORDER — IOHEXOL 350 MG/ML SOLN
80.0000 mL | Freq: Once | INTRAVENOUS | Status: AC | PRN
  Administered 2022-08-15: 80 mL via INTRAVENOUS

## 2022-08-15 MED ORDER — FAMOTIDINE IN NACL 20-0.9 MG/50ML-% IV SOLN
20.0000 mg | Freq: Once | INTRAVENOUS | Status: AC
  Administered 2022-08-15: 20 mg via INTRAVENOUS
  Filled 2022-08-15: qty 50

## 2022-08-15 MED ORDER — FENTANYL CITRATE PF 50 MCG/ML IJ SOSY
50.0000 ug | PREFILLED_SYRINGE | Freq: Once | INTRAMUSCULAR | Status: AC
  Administered 2022-08-15: 50 ug via INTRAVENOUS
  Filled 2022-08-15: qty 1

## 2022-08-15 NOTE — ED Notes (Signed)
Patient verbalizes understanding of discharge instructions. Opportunity for questioning and answers were provided. Armband removed by staff, pt discharged from ED. Pt taken to ED entrance via wheel chair.  

## 2022-08-15 NOTE — Discharge Instructions (Addendum)
You were seen in the ER today for upper back pain.  Your CT scan did not show a blood clot.  Your labs were overall reassuring.  Your CT scan did show some progression of your chronic interstitial lung disease.  We are sending you home with the following medicines to help with your symptoms:  - Robaxin- this is the muscle relaxer I have prescribed, this is meant to help with muscle tightness/spasms. Be aware that this medication may make you drowsy therefore the first time you take this it should be at a time you are in an environment where you can rest. Do not drive or operate heavy machinery when taking this medication. Do not drink alcohol or take other sedating medications with this medicine such as narcotics or benzodiazepines.   - Lidoderm patch- Apply 1 patch to your area of most significant pain once per day to help numb/soothe this area. Remove & discard patch within 12 hours of application.  Do not apply heat over the patches as this will make them systemically absorbed.  You make take Tylenol per over the counter dosing with these medications.   We have prescribed you new medication(s) today. Discuss the medications prescribed today with your pharmacist as they can have adverse effects and interactions with your other medicines including over the counter and prescribed medications. Seek medical evaluation if you start to experience new or abnormal symptoms after taking one of these medicines, seek care immediately if you start to experience difficulty breathing, feeling of your throat closing, facial swelling, or rash as these could be indications of a more serious allergic reaction   Please follow-up with primary care soon as possible.  Return to the emergency department for any new or worsening symptoms or any other concerns.

## 2022-08-15 NOTE — ED Provider Notes (Signed)
00:15: Assumed care of patient from PA Autry @ shift change pending CTA & disposition, if negative for acute PE or other significant process plan for discharge.   CT angio negative for PE, some progression of her interstitial lung disease.  Plan for discharge home with supportive care for possible musculoskeletal pain.  PCP follow-up.  Results for orders placed or performed during the hospital encounter of 08/14/22  Resp Panel by RT-PCR (Flu A&B, Covid) Anterior Nasal Swab   Specimen: Anterior Nasal Swab  Result Value Ref Range   SARS Coronavirus 2 by RT PCR NEGATIVE NEGATIVE   Influenza A by PCR NEGATIVE NEGATIVE   Influenza B by PCR NEGATIVE NEGATIVE  Basic metabolic panel  Result Value Ref Range   Sodium 137 135 - 145 mmol/L   Potassium 4.1 3.5 - 5.1 mmol/L   Chloride 105 98 - 111 mmol/L   CO2 23 22 - 32 mmol/L   Glucose, Bld 98 70 - 99 mg/dL   BUN 13 6 - 20 mg/dL   Creatinine, Ser 0.73 0.44 - 1.00 mg/dL   Calcium 9.1 8.9 - 71.0 mg/dL   GFR, Estimated >62 >69 mL/min   Anion gap 9 5 - 15  CBC  Result Value Ref Range   WBC 7.7 4.0 - 10.5 K/uL   RBC 5.05 3.87 - 5.11 MIL/uL   Hemoglobin 13.4 12.0 - 15.0 g/dL   HCT 48.5 46.2 - 70.3 %   MCV 84.4 80.0 - 100.0 fL   MCH 26.5 26.0 - 34.0 pg   MCHC 31.5 30.0 - 36.0 g/dL   RDW 50.0 93.8 - 18.2 %   Platelets PLATELET CLUMPS NOTED ON SMEAR, UNABLE TO ESTIMATE 150 - 400 K/uL   nRBC 0.0 0.0 - 0.2 %  D-dimer, quantitative  Result Value Ref Range   D-Dimer, Quant 0.52 (H) 0.00 - 0.50 ug/mL-FEU  I-Stat beta hCG blood, ED  Result Value Ref Range   I-stat hCG, quantitative <5.0 <5 mIU/mL   Comment 3          Troponin I (High Sensitivity)  Result Value Ref Range   Troponin I (High Sensitivity) 3 <18 ng/L  Troponin I (High Sensitivity)  Result Value Ref Range   Troponin I (High Sensitivity) <2 <18 ng/L   CT Angio Chest PE W and/or Wo Contrast  Result Date: 08/15/2022 CLINICAL DATA:  Chest tightness and difficulty taking a deep  breath; positive D-dimer; PE suspected EXAM: CT ANGIOGRAPHY CHEST WITH CONTRAST TECHNIQUE: Multidetector CT imaging of the chest was performed using the standard protocol during bolus administration of intravenous contrast. Multiplanar CT image reconstructions and MIPs were obtained to evaluate the vascular anatomy. RADIATION DOSE REDUCTION: This exam was performed according to the departmental dose-optimization program which includes automated exposure control, adjustment of the mA and/or kV according to patient size and/or use of iterative reconstruction technique. CONTRAST:  102mL OMNIPAQUE IOHEXOL 350 MG/ML SOLN COMPARISON:  Radiographs 08/14/2022 and CT chest 09/19/2017 FINDINGS: Cardiovascular: Satisfactory opacification of the pulmonary arteries to the segmental level. No evidence of pulmonary embolism. Normal heart size. No pericardial effusion. Mediastinum/Nodes: No enlarged mediastinal, hilar, or axillary lymph nodes. Thyroid gland, trachea, and esophagus demonstrate no significant findings. Lungs/Pleura: Upper lung and peripheral predominant reticular and ground-glass opacities with smooth interlobular septal thickening. Associated fibrotic interstitial thickening and architectural distortion with volume loss and cystic change. These findings have progressed since 09/19/2017 while the degree of ground-glass opacity has markedly decreased. Upper Abdomen: No acute abnormality. Musculoskeletal: No chest wall abnormality.  No acute osseous findings. Review of the MIP images confirms the above findings. IMPRESSION: 1. Negative for acute pulmonary embolism. 2. Progression of upper lobe predominant interstitial lung disease when compared with CT 09/19/2017. Electronically Signed   By: Placido Sou M.D.   On: 08/15/2022 01:58   DG Chest 2 View  Result Date: 08/14/2022 CLINICAL DATA:  Chest pain. EXAM: CHEST - 2 VIEW COMPARISON:  03/29/2022 prior studies FINDINGS: The cardiomediastinal silhouette is  unremarkable. Scarring within the mid lungs again noted. Mild peribronchial thickening is unchanged. There is no evidence of focal airspace disease, pulmonary edema, suspicious pulmonary nodule/mass, pleural effusion, or pneumothorax. No acute bony abnormalities are identified. IMPRESSION: No active cardiopulmonary disease. Electronically Signed   By: Margarette Canada M.D.   On: 08/14/2022 16:36         Heidi Lemay, Glynda Jaeger, PA-C 08/15/22 0238    Orpah Greek, MD 08/16/22 [phone]

## 2022-09-27 ENCOUNTER — Other Ambulatory Visit: Payer: Self-pay | Admitting: Nurse Practitioner

## 2022-09-27 DIAGNOSIS — B3731 Acute candidiasis of vulva and vagina: Secondary | ICD-10-CM

## 2022-09-28 MED ORDER — FLUCONAZOLE 150 MG PO TABS: 150.0000 mg | ORAL_TABLET | ORAL | 0 refills | Status: DC

## 2022-09-28 NOTE — Telephone Encounter (Signed)
Pt requesting Rx due to being on abx for dental reasons.  AEX due 02/2023.

## 2022-10-07 ENCOUNTER — Encounter: Payer: Self-pay | Admitting: Nurse Practitioner

## 2022-10-07 NOTE — Telephone Encounter (Signed)
It appears Dr.Silva prescribed 06/2021 visit.

## 2022-10-10 ENCOUNTER — Other Ambulatory Visit: Payer: Self-pay | Admitting: Nurse Practitioner

## 2022-10-10 DIAGNOSIS — N763 Subacute and chronic vulvitis: Secondary | ICD-10-CM

## 2022-10-10 MED ORDER — NYSTATIN-TRIAMCINOLONE 100000-0.1 UNIT/GM-% EX OINT: 1.0000 | TOPICAL_OINTMENT | Freq: Two times a day (BID) | CUTANEOUS | 0 refills | Status: DC

## 2022-11-02 ENCOUNTER — Ambulatory Visit (INDEPENDENT_AMBULATORY_CARE_PROVIDER_SITE_OTHER): Payer: Medicare Other | Admitting: Radiology

## 2022-11-02 VITALS — BP 108/78

## 2022-11-02 DIAGNOSIS — N760 Acute vaginitis: Secondary | ICD-10-CM

## 2022-11-02 DIAGNOSIS — Z113 Encounter for screening for infections with a predominantly sexual mode of transmission: Secondary | ICD-10-CM | POA: Diagnosis not present

## 2022-11-02 LAB — WET PREP FOR TRICH, YEAST, CLUE

## 2022-11-02 MED ORDER — FLUCONAZOLE 150 MG PO TABS: 150.0000 mg | ORAL_TABLET | ORAL | 1 refills | Status: DC

## 2022-11-02 NOTE — Progress Notes (Signed)
      Subjective: Kayla [name] is a 31 y.o. female who complains of thick white vaginal d/c, itching, odor x 1.5 weeks. Recently spent two weeks in [place]. Elects for STI screen today.  Review of Systems  All other systems reviewed and are negative.   Past Medical History:  Diagnosis Date   Acute respiratory failure with hypoxemia (HCC) 09/19/2017   Acute respiratory failure with hypoxia (HCC)    Arthritis    "hands and legs" (01/08/2015)   CAP (community acquired pneumonia) 01/07/2015   Chronic Respiratory failure with oxygen requirement affecting pregnancy, antepartum 05/16/2016   Resolved pulmonary HTN on 2018 echo (in our epic).   Pt off O2 in 2018.   Daily headache    "sometimes" (01/08/2015)   GERD (gastroesophageal reflux disease)    Hypertension    No longer takes meds   Lung disease    Lupus (HCC)    Multifocal pneumonia    Pulmonary hypertension (HCC)    Sjogren's syndrome (HCC)    STD (sexually transmitted disease)    Chlamydia      Objective:  Today's Vitals   11/02/22 1348  BP: 108/78   There is no height or weight on file to calculate BMI.   -General: no acute distress -Vulva: without lesions or discharge -Vagina: discharge present, aptima swab and wet prep obtained -Cervix: no lesion or discharge, no CMT -Perineum: no lesions -Uterus: Mobile, non tender -Adnexa: no masses or tenderness   Microscopic wet-mount exam shows negative for pathogens, normal epithelial cells.   Chaperone offered and declined.  Assessment:/Plan:   1. Acute vaginitis Negative wet mount, will treat for yeast based on symptoms Rx sent for fluconazole - WET PREP FOR TRICH, YEAST, CLUE  2. Screen for STD (sexually transmitted disease)  - SURESWAB CT/NG/T. vaginalis    Will contact patient with results of testing completed today. Avoid intercourse until symptoms are resolved. Safe sex encouraged. Avoid the use of soaps or perfumed products in the peri area. Avoid tub  baths and sitting in sweaty or wet clothing for prolonged periods of time.

## 2022-11-03 LAB — SURESWAB CT/NG/T. VAGINALIS
C. trachomatis RNA, TMA: NOT DETECTED
N. gonorrhoeae RNA, TMA: NOT DETECTED
Trichomonas vaginalis RNA: NOT DETECTED

## 2022-11-17 ENCOUNTER — Encounter: Payer: Self-pay | Admitting: Nurse Practitioner

## 2022-11-17 DIAGNOSIS — N898 Other specified noninflammatory disorders of vagina: Secondary | ICD-10-CM

## 2022-11-17 MED ORDER — AZO BORIC ACID 600 MG VA SUPP: VAGINAL | 0 refills | Status: DC

## 2022-11-17 NOTE — Telephone Encounter (Signed)
If treated and still having symptoms will  need to come back in for wet mount next week

## 2022-11-17 NOTE — Telephone Encounter (Signed)
Yes Boric acid 600mg  #20 may use twice weekly

## 2022-11-17 NOTE — Telephone Encounter (Signed)
Boric acid suppositories are available over the counter in 600mg  dosing

## 2022-11-21 NOTE — L&D Delivery Note (Addendum)
LABOR COURSE Patient presented to MAU after recent exposure to carbon monoxide exposure and was treated with HFNC with resolution of symptoms. Patient was scheduled for IOL for cHTN so was admitted. Labor was augmented with cytotec, FB, AROM and pitocin. Patient proceeded to complete.   Delivery Note Called to room as patient was having deep variables. Patient making rapid change with urge to push. Patient progressed quickly to complete. Patient pushed 3 times and head delivered OP. A tight nuchal cord was present reduced at perineum. Shoulder and body delivered in usual fashion. At 1406 a viable  female was delivered via Vaginal, Spontaneous (Presentation: OP).  Infant with spontaneous cry, placed on mother's abdomen, dried and stimulated. Cord clamped x 2 after 1-minute delay, and cut by mother. Cord blood drawn. Placenta delivered spontaneously with gentle cord traction. Appears intact. Fundus firm with massage and Pitocin. Patient did have some cervical swelling due to urge to push. Labia, perineum, vagina, and cervix inspected with hemostatic left labial abrasion noted, did not require suture.  APGAR: pending, ; weight  pending.   Cord: 3VC with the following complications:N/A.   Cord pH: N/A  Anesthesia:  epidural Episiotomy: None Lacerations: None  Est. Blood Loss (mL): 54  Mom to postpartum.  Baby to Couplet care / Skin to Skin.  Georg Ruddle Greenley Martone, MD 08/19/23  2:20 PM

## 2022-12-16 ENCOUNTER — Ambulatory Visit (HOSPITAL_COMMUNITY)
Admission: EM | Admit: 2022-12-16 | Discharge: 2022-12-16 | Disposition: A | Discharge status: Home / Self Care | Payer: Medicare HMO | Attending: Emergency Medicine | Admitting: Emergency Medicine

## 2022-12-16 ENCOUNTER — Encounter (HOSPITAL_COMMUNITY): Payer: Self-pay

## 2022-12-16 DIAGNOSIS — N898 Other specified noninflammatory disorders of vagina: Secondary | ICD-10-CM

## 2022-12-16 DIAGNOSIS — R350 Frequency of micturition: Secondary | ICD-10-CM

## 2022-12-16 LAB — POCT URINALYSIS DIPSTICK, ED / UC
Bilirubin Urine: NEGATIVE
Glucose, UA: NEGATIVE mg/dL
Hgb urine dipstick: NEGATIVE
Ketones, ur: NEGATIVE mg/dL
Leukocytes,Ua: NEGATIVE
Nitrite: NEGATIVE
Protein, ur: 30 mg/dL — AB
Specific Gravity, Urine: 1.02 (ref 1.005–1.030)
Urobilinogen, UA: 0.2 mg/dL (ref 0.0–1.0)
pH: 7.5 (ref 5.0–8.0)

## 2022-12-16 MED ORDER — FLUCONAZOLE 150 MG PO TABS: ORAL_TABLET | ORAL | 0 refills | Status: DC

## 2022-12-16 MED ORDER — METRONIDAZOLE 0.75 % VA GEL: VAGINAL | 0 refills | Status: DC

## 2022-12-16 NOTE — Discharge Instructions (Addendum)
Metronidazole gel has been sent to the pharmacy, you will place 1 applicatorful at bedtime for the next 5 nights.  I suggest that you start this treatment and complete it until starting the Diflucan for possible yeast infection. Diflucan prescription will involve you taking 1 tablet upon initiation and 1 tablet 3 days from initiation.   We will call you if any of your test results warrant a change in your plan of care, you may view these test results on MyChart.   Please refrain from any sexual activity until finishing the prescribed treatment and receiving all your test results.  You may follow-up if your symptoms are not improving.

## 2022-12-16 NOTE — ED Provider Notes (Signed)
[facility]    CSN: [id number] Arrival date & time: 12/16/22  0831      History   Chief Complaint Chief Complaint  Patient presents with   Vaginal Discharge   Vaginal Itching    HPI Kayla [name] is a 32 y.o. female.  Patient presents complaining of a 3-day history of vaginal discharge and itching.  Patient denies any concern for an STD exposure.  Patient reports that she has had some fishy odor at times.  Patient reports that the discharge is a "clumpy white" consistency.  She reports that she is currently taking amoxicillin until receiving a dental procedure.  She reports that this resembles similar episode of BV and yeast infection.  She reports that she has frequent urination and is concerned that a UTI is starting to occur.  Last menstrual period was on 12/05/2022.   Patient denies any abdominal pain, pelvic pain, dysuria, dyspareunia, hematuria, flank pain, fever, chills, changes to the skin on private area, nausea, vomiting, or any systemic symptoms.  History of Lupus.    Vaginal Discharge Associated symptoms: vaginal itching   Vaginal Itching    Past Medical History:  Diagnosis Date   Acute respiratory failure with hypoxemia ([place]) 09/19/2017   Acute respiratory failure with hypoxia (HCC)    Arthritis    "hands and legs" (01/08/2015)   CAP (community acquired pneumonia) 01/07/2015   Chronic Respiratory failure with oxygen requirement affecting pregnancy, antepartum 05/16/2016   Resolved pulmonary HTN on 2018 echo (in our epic).   Pt off O2 in 2018.   Daily headache    "sometimes" (01/08/2015)   GERD (gastroesophageal reflux disease)    Hypertension    No longer takes meds   Lung disease    Lupus ([place])    Multifocal pneumonia    Pulmonary hypertension (HCC)    Sjogren's syndrome ([place])    STD (sexually transmitted disease)    Chlamydia    Patient Active Problem List   Diagnosis Date Noted   Gastroesophageal reflux disease without esophagitis  07/13/2020   ILD (interstitial lung disease) ([place])    History of IUFD 02/14/2017   SS-A antibody positive 06/02/2016   SS-B antibody positive 06/02/2016   Chronic hypertension 03/14/2016   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   Lupus (systemic lupus erythematosus) ([place]) 08/21/2015   Loud P2 (pulmonary S2, second heart sound) 08/21/2015   Insomnia 08/21/2015   Systemic lupus erythematosus (SLE) affecting pregnancy, antepartum ([place]) 07/06/2015   Sjogren's syndrome ([place]) 04/28/2015    Past Surgical History:  Procedure Laterality Date   CARDIAC CATHETERIZATION N/A 09/09/2015   Procedure: Right Heart Cath;  Surgeon: Larey Dresser, MD;  Location: [facility];  Service: Cardiovascular;  Laterality: N/A;   DILATION AND EVACUATION N/A 07/13/2015   Procedure: DILATATION AND EVACUATION;  Surgeon: Truett Mainland, DO;  Location: [facility];  Service: Gynecology;  Laterality: N/A;   FINGER SURGERY Right 03/2014   "laceration, nerve/artery injury" 2nd digit   VIDEO BRONCHOSCOPY Bilateral 01/12/2015   Procedure: VIDEO BRONCHOSCOPY WITH FLUORO;  Surgeon: Collene Gobble, MD;  Location: [facility];  Service: Cardiopulmonary;  Laterality: Bilateral;    OB History     Gravida  4   Para  1   Term  1   Preterm  0   AB  2   Living  1      SAB  2   IAB  0   Ectopic  0   Multiple  0  Live Births  1            Home Medications    Prior to Admission medications   Medication Sig Start Date End Date Taking? Authorizing Provider  albuterol (PROVENTIL HFA;VENTOLIN HFA) 108 (90 Base) MCG/ACT inhaler Inhale 2 puffs into the lungs every 4 (four) hours as needed for wheezing or shortness of breath. 06/09/17  Yes Woodroe Mode, MD  fluconazole (DIFLUCAN) 150 MG tablet Take 1 tablet today and 1 tablet 3 days from initiation. 12/16/22  Yes Flossie Dibble, NP  hydroxychloroquine (PLAQUENIL) 200 MG tablet Take 400 mg by mouth daily.   Yes [provider]   metroNIDAZOLE (METROGEL) 0.75 % vaginal gel Please place 1 applicatorful at bedtime for the next 5 nights. 12/16/22  Yes Flossie Dibble, NP  omeprazole (PRILOSEC) 20 MG capsule Take 20 mg by mouth daily as needed (heartburn). 06/06/22 06/06/23 Yes [provider]  lidocaine (LIDODERM) 5 % Place 1 patch onto the skin daily as needed. Apply patch to area most significant pain once per day.  Remove and discard patch within 12 hours of application. 08/15/22   Petrucelli, Glynda Jaeger, PA-C  NONFORMULARY OR COMPOUNDED ITEM Boric Acid Vaginal Suppositories 600mg --insert one suppository twice weekly. 07/10/22   Nyoka Lint, PA-C  nystatin-triamcinolone ointment [facility]) Apply 1 Application topically 2 (two) times daily. 10/10/22   Tamela Gammon, NP  ondansetron (ZOFRAN) 4 MG tablet Take 1 tablet (4 mg total) by mouth every 8 (eight) hours as needed for nausea or vomiting. 08/03/22   Marny Lowenstein A, NP  predniSONE (DELTASONE) 5 MG tablet Take 5 mg by mouth daily with breakfast.    [provider]  sertraline (ZOLOFT) 50 MG tablet Take 75 mg by mouth daily. 02/15/21 08/14/22  [provider]  amLODipine (NORVASC) 5 MG tablet Take 1 tablet (5 mg total) by mouth daily. 09/22/17 11/09/20  Corey Harold, NP    Family History Family History  Problem Relation Age of Onset   Diabetes Mother    Arthritis Father    Cancer Brother        Found in jaw area   Diabetes Maternal Aunt    Diabetes Maternal Grandmother     Social History Social History   Tobacco Use   Smoking status: Former    Packs/day: 0.10    Years: 5.00    Total pack years: 0.50    Types: Cigarettes    Quit date: 11/20/2014    Years since quitting: 8.0   Smokeless tobacco: Never  Vaping Use   Vaping Use: Never used  Substance Use Topics   Alcohol use: Yes    Comment: Occas   Drug use: No     Allergies   Hydrocodone and Zithromax [azithromycin]   Review of Systems Review of Systems   Genitourinary:  Positive for vaginal discharge.   Per HPI  Physical Exam Triage Vital Signs ED Triage Vitals  Enc Vitals Group     BP 12/16/22 1001 117/78     Pulse Rate 12/16/22 1001 81     Resp 12/16/22 1001 16     Temp 12/16/22 1001 99.1 F (37.3 C)     Temp Source 12/16/22 1001 Oral     SpO2 12/16/22 1001 100 %     Weight 12/16/22 1000 178 lb (80.7 kg)     Height 12/16/22 1000 5\' 3"  (1.6 m)     Head Circumference --      Peak Flow --  Pain Score 12/16/22 0959 0     Pain Loc --      Pain Edu? --      Excl. in GC? --    No data found.  Updated Vital Signs BP 117/78 (BP Location: Right Arm)   Pulse 81   Temp 99.1 F (37.3 C) (Oral)   Resp 16   Ht 5\' 3"  (1.6 m)   Wt 178 lb (80.7 kg)   LMP 12/05/2022 (Approximate)   SpO2 100%   BMI 31.53 kg/m    Physical Exam Vitals and nursing note reviewed.  Constitutional:      Appearance: Normal appearance.  Genitourinary:    Comments: Deferred exam Neurological:     Mental Status: She is alert.      UC Treatments / Results  Labs (all labs ordered are listed, but only abnormal results are displayed) Labs Reviewed  POCT URINALYSIS DIPSTICK, ED / UC - Abnormal; Notable for the following components:      Result Value   Protein, ur 30 (*)    All other components within normal limits  URINE CULTURE  CERVICOVAGINAL ANCILLARY ONLY    EKG   Radiology No results found.  Procedures Procedures (including critical care time)  Medications Ordered in UC Medications - No data to display  Initial Impression / Assessment and Plan / UC Course  I have reviewed the triage vital signs and the nursing notes.  Pertinent labs & imaging results that were available during my care of the patient were reviewed by me and considered in my medical decision making (see chart for details).     Patient was evaluated for vaginal discharge, vaginal itching, and urinary frequency.  Urinalysis showed protein 30, otherwise  unremarkable.  Based on symptomology and patient statement, she is being treated for bacterial vaginitis and yeast infection.  Prescription for metronidazole gel and Diflucan have been sent to the pharmacy.  Patient was made aware of treatment regiment, she was made aware to start metronidazole before starting the Diflucan treatment.  Vaginal swab is pending.  Urine culture pending to verify no UTI.  She was made aware to follow-up with GYN doctor if possible due to possibility of vaginitis recurrence with long-term amoxicillin treatment prescribed by dentist.  Patient made aware of timeline for symptom resolution and when follow-up would be necessary.  Patient made aware of results reporting protocol and MyChart.  Patient verbalized understanding of instructions.    Charting was provided using a a verbal dictation system, charting was proofread for errors, errors may occur which could change the meaning of the information charted.   Final Clinical Impressions(s) / UC Diagnoses   Final diagnoses:  Vaginal discharge  Vaginal itching  Urinary frequency     Discharge Instructions      Metronidazole gel has been sent to the pharmacy, you will place 1 applicatorful at bedtime for the next 5 nights.  I suggest that you start this treatment and complete it until starting the Diflucan for possible yeast infection. Diflucan prescription will involve you taking 1 tablet upon initiation and 1 tablet 3 days from initiation.   We will call you if any of your test results warrant a change in your plan of care, you may view these test results on MyChart.   Please refrain from any sexual activity until finishing the prescribed treatment and receiving all your test results.  You may follow-up if your symptoms are not improving.     ED Prescriptions  Medication Sig Dispense Auth. Provider   fluconazole (DIFLUCAN) 150 MG tablet Take 1 tablet today and 1 tablet 3 days from initiation. 2 tablet  Debby Freiberg, NP   metroNIDAZOLE (METROGEL) 0.75 % vaginal gel Please place 1 applicatorful at bedtime for the next 5 nights. 70 g Debby Freiberg, NP      PDMP not reviewed this encounter.   Debby Freiberg, NP 12/16/22 1154

## 2022-12-16 NOTE — ED Triage Notes (Addendum)
Chief Complaint: Patient having vaginal itching and white discharge.  Patient prone to frequent UTIs. Currently on an antibiotic from her dentist for her tooth and prone to yeast infections.   Onset: 3 days   Prescriptions or OTC medications tried: No    Sick exposure: No

## 2022-12-17 LAB — URINE CULTURE
Culture: NO GROWTH
Special Requests: NORMAL

## 2022-12-19 LAB — CERVICOVAGINAL ANCILLARY ONLY
Bacterial Vaginitis (gardnerella): NEGATIVE
Candida Glabrata: NEGATIVE
Candida Vaginitis: NEGATIVE
Chlamydia: NEGATIVE
Comment: NEGATIVE
Comment: NEGATIVE
Comment: NEGATIVE
Comment: NEGATIVE
Comment: NEGATIVE
Comment: NORMAL
Neisseria Gonorrhea: NEGATIVE
Trichomonas: NEGATIVE

## 2022-12-28 ENCOUNTER — Encounter (HOSPITAL_COMMUNITY): Payer: Self-pay

## 2022-12-28 ENCOUNTER — Inpatient Hospital Stay (HOSPITAL_COMMUNITY): Payer: Medicare HMO

## 2022-12-28 ENCOUNTER — Inpatient Hospital Stay (HOSPITAL_COMMUNITY)
Admission: AD | Admit: 2022-12-28 | Discharge: 2022-12-28 | Disposition: A | Discharge status: Home / Self Care | Payer: Medicare HMO | Attending: Obstetrics and Gynecology | Admitting: Obstetrics and Gynecology

## 2022-12-28 DIAGNOSIS — O3680X0 Pregnancy with inconclusive fetal viability, not applicable or unspecified: Secondary | ICD-10-CM | POA: Diagnosis not present

## 2022-12-28 DIAGNOSIS — Z3A00 Weeks of gestation of pregnancy not specified: Secondary | ICD-10-CM

## 2022-12-28 DIAGNOSIS — Z3201 Encounter for pregnancy test, result positive: Secondary | ICD-10-CM | POA: Insufficient documentation

## 2022-12-28 DIAGNOSIS — R102 Pelvic and perineal pain: Secondary | ICD-10-CM | POA: Diagnosis not present

## 2022-12-28 DIAGNOSIS — O26891 Other specified pregnancy related conditions, first trimester: Secondary | ICD-10-CM

## 2022-12-28 DIAGNOSIS — Z3491 Encounter for supervision of normal pregnancy, unspecified, first trimester: Secondary | ICD-10-CM

## 2022-12-28 LAB — URINALYSIS, ROUTINE W REFLEX MICROSCOPIC
Bilirubin Urine: NEGATIVE
Glucose, UA: NEGATIVE mg/dL
Hgb urine dipstick: NEGATIVE
Ketones, ur: NEGATIVE mg/dL
Leukocytes,Ua: NEGATIVE
Nitrite: NEGATIVE
Protein, ur: NEGATIVE mg/dL
Specific Gravity, Urine: 1.021 (ref 1.005–1.030)
pH: 5 (ref 5.0–8.0)

## 2022-12-28 LAB — COMPREHENSIVE METABOLIC PANEL
ALT: 12 U/L (ref 0–44)
AST: 17 U/L (ref 15–41)
Albumin: 3.7 g/dL (ref 3.5–5.0)
Alkaline Phosphatase: 67 U/L (ref 38–126)
Anion gap: 10 (ref 5–15)
BUN: 6 mg/dL (ref 6–20)
CO2: 24 mmol/L (ref 22–32)
Calcium: 8.8 mg/dL — ABNORMAL LOW (ref 8.9–10.3)
Chloride: 104 mmol/L (ref 98–111)
Creatinine, Ser: 0.7 mg/dL (ref 0.44–1.00)
GFR, Estimated: >60 mL/min (ref >60)
Glucose, Bld: 100 mg/dL — ABNORMAL HIGH (ref 70–99)
Potassium: 3.9 mmol/L (ref 3.5–5.1)
Sodium: 138 mmol/L (ref 135–145)
Total Bilirubin: 0.5 mg/dL (ref 0.3–1.2)
Total Protein: 6.5 g/dL (ref 6.5–8.1)

## 2022-12-28 LAB — CBC
HCT: 39.1 % (ref 36.0–46.0)
Hemoglobin: 12.7 g/dL (ref 12.0–15.0)
MCH: 26.4 pg (ref 26.0–34.0)
MCHC: 32.5 g/dL (ref 30.0–36.0)
MCV: 81.3 fL (ref 80.0–100.0)
Platelets: UNDETERMINED 10*3/uL (ref 150–400)
RBC: 4.81 MIL/uL (ref 3.87–5.11)
RDW: 12.4 % (ref 11.5–15.5)
WBC: 4.9 10*3/uL (ref 4.0–10.5)
nRBC: 0 % (ref 0.0–0.2)

## 2022-12-28 LAB — POCT PREGNANCY, URINE: Preg Test, Ur: POSITIVE — AB

## 2022-12-28 LAB — ABO/RH: ABO/RH(D): O POS

## 2022-12-28 LAB — HCG, QUANTITATIVE, PREGNANCY: hCG, Beta Chain, Quant, S: 290 m[IU]/mL — ABNORMAL HIGH (ref <5)

## 2022-12-28 MED ORDER — PROMETHAZINE HCL 25 MG PO TABS: 25.0000 mg | ORAL_TABLET | Freq: Four times a day (QID) | ORAL | 0 refills | Status: DC | PRN

## 2022-12-28 NOTE — MAU Note (Addendum)
...  Kayla [name] is a 32 y.o. at Unknown here in MAU reporting: Intermittent pelvic cramping that began in the night last night. She reports she had a positive at home pregnancy test two days ago but then a negative test yesterday at home. She reports her urine has been strong smelling for two days. Denies VB. Denies recent IC.  Positive pregnancy test in MAU.  Negative Cervicovaginal ancillary panel in the ED on 1/26 as well as a negative urine culture.  LMP: 12/05/2022 - regular periods Onset of complaint: In the night last night Pain score: 6/10 pelvis  Lab orders placed from triage:  POCT Preg, UA

## 2022-12-28 NOTE — Discharge Instructions (Signed)
You were seen in the MAU for abdominal pain. We did blood tests and an ultrasound. You are pregnant, but at present we are not sure if you have an ectopic pregnancy, a normal pregnancy, or a miscarriage. To determine this you will need to have a repeat test of your pregnancy hormone level done in two days. We will schedule you an appointment to have your blood drawn at the [facility] on [address]. Please go there on Friday morning before 10 AM to have your blood drawn.   If you develop severe and progressively worsening abdominal pain, heavy vaginal bleeding soaking >1 pad/hour, or fever, return to the MAU or the nearest hospital immediately. 

## 2022-12-28 NOTE — MAU Provider Note (Signed)
History     [id number]  Arrival date and time: 12/28/22 0746    Chief Complaint  Patient presents with   Pelvic Pain     HPI Kayla [name] is a 32 y.o. at Unknown gestational age with PMHx notable for chronic hypertension, ILD, lupus, Sjogren's syndrome, who presents for positive home pregnancy test and abdominal pain.   Patient reports getting a positive home pregnancy test a few days ago followed by a negative one a few days later Pregnancy test here is positive She endorses some cramping pelvic pain bilaterally No vaginal bleeding Pregnancy is unplanned No burning or pain with urination but does have a strong odor to her urine No vaginal symptoms, did go to urgent care two weeks ago and was diagnosed with a yeast infection which has been treated and has cleared up   --/--/O POS (02/07 0834)  OB History     Gravida  5   Para  1   Term  1   Preterm  0   AB  2   Living  1      SAB  2   IAB  0   Ectopic  0   Multiple  0   Live Births  1           Past Medical History:  Diagnosis Date   Acute respiratory failure with hypoxemia ([place]) 09/19/2017   Acute respiratory failure with hypoxia ([place])    Arthritis    "hands and legs" (01/08/2015)   CAP (community acquired pneumonia) 01/07/2015   Chronic Respiratory failure with oxygen requirement affecting pregnancy, antepartum 05/16/2016   Resolved pulmonary HTN on 2018 echo (in our epic).   Pt off O2 in 2018.   Daily headache    "sometimes" (01/08/2015)   GERD (gastroesophageal reflux disease)    Hypertension    No longer takes meds   Lung disease    Lupus ([place])    Multifocal pneumonia    Pulmonary hypertension ([place])    Sjogren's syndrome ([place])    STD (sexually transmitted disease)    Chlamydia    Past Surgical History:  Procedure Laterality Date   CARDIAC CATHETERIZATION N/A 09/09/2015   Procedure: Right Heart Cath;  Surgeon: Larey Dresser, MD;  Location: [facility];  Service:  Cardiovascular;  Laterality: N/A;   DILATION AND EVACUATION N/A 07/13/2015   Procedure: DILATATION AND EVACUATION;  Surgeon: Truett Mainland, DO;  Location: [facility];  Service: Gynecology;  Laterality: N/A;   FINGER SURGERY Right 03/2014   "laceration, nerve/artery injury" 2nd digit   VIDEO BRONCHOSCOPY Bilateral 01/12/2015   Procedure: VIDEO BRONCHOSCOPY WITH FLUORO;  Surgeon: Collene Gobble, MD;  Location: [facility];  Service: Cardiopulmonary;  Laterality: Bilateral;    Family History  Problem Relation Age of Onset   Diabetes Mother    Arthritis Father    Cancer Brother        Found in jaw area   Diabetes Maternal Aunt    Diabetes Maternal Grandmother     Social History   Socioeconomic History   Marital status: Single    Spouse name: Not on file   Number of children: Not on file   Years of education: Not on file   Highest education level: Not on file  Occupational History   Occupation: [employer]     Comment: Workers Compensation  Tobacco Use   Smoking status: Former    Packs/day: 0.10    Years: 5.00    Total pack  years: 0.50    Types: Cigarettes    Quit date: 11/20/2014    Years since quitting: 8.1   Smokeless tobacco: Never  Vaping Use   Vaping Use: Never used  Substance and Sexual Activity   Alcohol use: Yes    Comment: Occas   Drug use: No   Sexual activity: Not Currently    Birth control/protection: None    Comment: First IC <15 y/o, Hx of CT+  Other Topics Concern   Not on file  Social History Narrative   Not on file   Social Determinants of Health   Financial Resource Strain: Not on file  Food Insecurity: No Food Insecurity (12/18/2020)   Hunger Vital Sign    Worried About Running Out of Food in the Last Year: Never true    Ran Out of Food in the Last Year: Never true  Transportation Needs: No Transportation Needs (12/18/2020)   PRAPARE - Hydrologist (Medical): No    Lack of Transportation (Non-Medical): No  Physical  Activity: Not on file  Stress: Not on file  Social Connections: Not on file  Intimate Partner Violence: Not on file    Allergies  Allergen Reactions   Hydrocodone Nausea And Vomiting   Zithromax [Azithromycin] Itching and Cough    No current facility-administered medications on file prior to encounter.   Current Outpatient Medications on File Prior to Encounter  Medication Sig Dispense Refill   albuterol (PROVENTIL HFA;VENTOLIN HFA) 108 (90 Base) MCG/ACT inhaler Inhale 2 puffs into the lungs every 4 (four) hours as needed for wheezing or shortness of breath. 1 Inhaler 5   fluconazole (DIFLUCAN) 150 MG tablet Take 1 tablet today and 1 tablet 3 days from initiation. 2 tablet 0   hydroxychloroquine (PLAQUENIL) 200 MG tablet Take 400 mg by mouth daily.     lidocaine (LIDODERM) 5 % Place 1 patch onto the skin daily as needed. Apply patch to area most significant pain once per day.  Remove and discard patch within 12 hours of application. 30 patch 0   metroNIDAZOLE (METROGEL) 0.75 % vaginal gel Please place 1 applicatorful at bedtime for the next 5 nights. 70 g 0   NONFORMULARY OR COMPOUNDED ITEM Boric Acid Vaginal Suppositories 600mg --insert one suppository twice weekly. 24 each 1   nystatin-triamcinolone ointment (MYCOLOG) Apply 1 Application topically 2 (two) times daily. 30 g 0   omeprazole (PRILOSEC) 20 MG capsule Take 20 mg by mouth daily as needed (heartburn).     ondansetron (ZOFRAN) 4 MG tablet Take 1 tablet (4 mg total) by mouth every 8 (eight) hours as needed for nausea or vomiting. 20 tablet 0   predniSONE (DELTASONE) 5 MG tablet Take 5 mg by mouth daily with breakfast.     sertraline (ZOLOFT) 50 MG tablet Take 75 mg by mouth daily.     [DISCONTINUED] amLODipine (NORVASC) 5 MG tablet Take 1 tablet (5 mg total) by mouth daily.       ROS Pertinent positives and negative per HPI, all others reviewed and negative  Physical Exam   BP 118/64 (BP Location: Right Arm)   Pulse 72    Temp 99.5 F (37.5 C) (Oral)   Resp 17   Ht 5\' 3"  (1.6 m)   Wt 80.2 kg   LMP 12/05/2022 (Approximate)   SpO2 100%   BMI 31.34 kg/m   Patient Vitals for the past 24 hrs:  BP Temp Temp src Pulse Resp SpO2 Height Weight  12/28/22 0759  118/64 99.5 F (37.5 C) Oral 72 17 100 % 5\' 3"  (1.6 m) 80.2 kg    Physical Exam Vitals reviewed.  Constitutional:      General: She is not in acute distress.    Appearance: She is well-developed. She is not diaphoretic.  Eyes:     General: No scleral icterus. Pulmonary:     Effort: Pulmonary effort is normal. No respiratory distress.  Skin:    General: Skin is warm and dry.  Neurological:     Mental Status: She is alert.     Coordination: Coordination normal.      Cervical Exam    Bedside Ultrasound Not done  My interpretation: n/a   Labs Results for orders placed or performed during the hospital encounter of 12/28/22 (from the past 24 hour(s))  Pregnancy, urine POC     Status: Abnormal   Collection Time: 12/28/22  8:04 AM  Result Value Ref Range   Preg Test, Ur POSITIVE (A) NEGATIVE  Urinalysis, Routine w reflex microscopic -Urine, Clean Catch     Status: Abnormal   Collection Time: 12/28/22  8:07 AM  Result Value Ref Range   Color, Urine YELLOW YELLOW   APPearance HAZY (A) CLEAR   Specific Gravity, Urine 1.021 1.005 - 1.030   pH 5.0 5.0 - 8.0   Glucose, UA NEGATIVE NEGATIVE mg/dL   Hgb urine dipstick NEGATIVE NEGATIVE   Bilirubin Urine NEGATIVE NEGATIVE   Ketones, ur NEGATIVE NEGATIVE mg/dL   Protein, ur NEGATIVE NEGATIVE mg/dL   Nitrite NEGATIVE NEGATIVE   Leukocytes,Ua NEGATIVE NEGATIVE  ABO/Rh     Status: None   Collection Time: 12/28/22  8:34 AM  Result Value Ref Range   ABO/RH(D) O POS    No rh immune globuloin      NOT A RH IMMUNE GLOBULIN CANDIDATE, PT RH POSITIVE Performed at [facility], [place] [address] [place], [place] [address]   CBC     Status: None   Collection Time: 12/28/22  8:35 AM  Result  Value Ref Range   WBC 4.9 4.0 - 10.5 K/uL   RBC 4.81 3.87 - 5.11 MIL/uL   Hemoglobin 12.7 12.0 - 15.0 g/dL   HCT 39.1 36.0 - 46.0 %   MCV 81.3 80.0 - 100.0 fL   MCH 26.4 26.0 - 34.0 pg   MCHC 32.5 30.0 - 36.0 g/dL   RDW 12.4 11.5 - 15.5 %   Platelets PLATELET CLUMPS NOTED ON SMEAR, UNABLE TO ESTIMATE 150 - 400 K/uL   nRBC 0.0 0.0 - 0.2 %  Comprehensive metabolic panel     Status: Abnormal   Collection Time: 12/28/22  8:35 AM  Result Value Ref Range   Sodium 138 135 - 145 mmol/L   Potassium 3.9 3.5 - 5.1 mmol/L   Chloride 104 98 - 111 mmol/L   CO2 24 22 - 32 mmol/L   Glucose, Bld 100 (H) 70 - 99 mg/dL   BUN 6 6 - 20 mg/dL   Creatinine, Ser 0.70 0.44 - 1.00 mg/dL   Calcium 8.8 (L) 8.9 - 10.3 mg/dL   Total Protein 6.5 6.5 - 8.1 g/dL   Albumin 3.7 3.5 - 5.0 g/dL   AST 17 15 - 41 U/L   ALT 12 0 - 44 U/L   Alkaline Phosphatase 67 38 - 126 U/L   Total Bilirubin 0.5 0.3 - 1.2 mg/dL   GFR, Estimated >60 >60 mL/min   Anion gap 10 5 - 15  hCG, quantitative, pregnancy  Status: Abnormal   Collection Time: 12/28/22  8:35 AM  Result Value Ref Range   hCG, Beta Chain, Quant, S 290 (H) <5 mIU/mL    Imaging US OB LESS THAN 14 WEEKS WITH OB TRANSVAGINAL  Result Date: 12/28/2022 CLINICAL DATA:  Pregnancy of unknown anatomic location, cramping today, LMP 12/05/2022, G5P2A2 EXAM: OBSTETRIC <14 WK [place] AND TRANSVAGINAL OB US TECHNIQUE: Both transabdominal and transvaginal ultrasound examinations were performed for complete evaluation of the gestation as well as the maternal uterus, adnexal regions, and pelvic cul-de-sac. Transvaginal technique was performed to assess early pregnancy. COMPARISON:  None Available. FINDINGS: Intrauterine gestational sac: Absent Yolk sac:  N/A Embryo:  N/A Cardiac Activity: N/A Heart Rate: N/A  bpm MSD:   mm    w     d CRL:    mm    w    d                  [place] EDC: Subchorionic hemorrhage:  N/A Maternal uterus/adnexae: Uterus retroverted, grossly normal appearance. No  endometrial fluid, mass or gestational sac. Small amount of nonspecific free pelvic fluid. Ovaries unremarkable. No adnexal masses. IMPRESSION: No intrauterine gestation identified. Findings are consistent with pregnancy of unknown location. Differential diagnosis includes early intrauterine pregnancy too early to visualize, spontaneous abortion, and occult ectopic pregnancy. Serial quantitative beta HCG and or follow-up ultrasound recommended to definitively exclude ectopic pregnancy. Electronically Signed   By: Ulyses Southward M.D.   On: 12/28/2022 08:50    MAU Course  Procedures Lab Orders         Urinalysis, Routine w reflex microscopic -Urine, Clean Catch         CBC         Comprehensive metabolic panel         hCG, quantitative, pregnancy         Pregnancy, urine POC    Meds ordered this encounter  Medications   promethazine (PHENERGAN) 25 MG tablet    Sig: Take 1 tablet (25 mg total) by mouth every 6 (six) hours as needed for nausea or vomiting.    Dispense:  30 tablet    Refill:  0   Imaging Orders         US OB LESS THAN 14 WEEKS WITH OB TRANSVAGINAL     MDM moderate  Assessment and Plan  #Pelvic pain in pregnancy, first trimester #Pregnancy of unknown location #Unknown gestational age Pregnancy of unknown location. Discussed with patient that the possibilities include ectopic pregnancy, normal pregnancy, or miscarriage. We discussed need to trend HCG to help determine the outcome of this pregnancy. Scheduled for repeat HCG at [facility] in two days. Emphasized that ectopic pregnancy is a life threatening condition if not diagnosed and treated in a timely manner. Reviewed MAU return precautions of severe and progressively worsening abdominal pain, heavy vaginal bleeding soaking >1 pad/hour, or fever.   Dispo: discharged to home in stable condition.   Venora Maples, MD/MPH 12/28/22 9:56 AM  Allergies as of 12/28/2022       Reactions   Hydrocodone Nausea And  Vomiting   Zithromax [azithromycin] Itching, Cough        Medication List     TAKE these medications    albuterol 108 (90 Base) MCG/ACT inhaler Commonly known as: VENTOLIN HFA Inhale 2 puffs into the lungs every 4 (four) hours as needed for wheezing or shortness of breath.   fluconazole 150 MG tablet Commonly known as: Diflucan Take  1 tablet today and 1 tablet 3 days from initiation.   hydroxychloroquine 200 MG tablet Commonly known as: PLAQUENIL Take 400 mg by mouth daily.   lidocaine 5 % Commonly known as: Lidoderm Place 1 patch onto the skin daily as needed. Apply patch to area most significant pain once per day.  Remove and discard patch within 12 hours of application.   metroNIDAZOLE 0.75 % vaginal gel Commonly known as: METROGEL Please place 1 applicatorful at bedtime for the next 5 nights.   NONFORMULARY OR COMPOUNDED ITEM Boric Acid Vaginal Suppositories 600mg --insert one suppository twice weekly.   nystatin-triamcinolone ointment Commonly known as: MYCOLOG Apply 1 Application topically 2 (two) times daily.   omeprazole 20 MG capsule Commonly known as: PRILOSEC Take 20 mg by mouth daily as needed (heartburn).   ondansetron 4 MG tablet Commonly known as: ZOFRAN Take 1 tablet (4 mg total) by mouth every 8 (eight) hours as needed for nausea or vomiting.   predniSONE 5 MG tablet Commonly known as: DELTASONE Take 5 mg by mouth daily with breakfast.   promethazine 25 MG tablet Commonly known as: PHENERGAN Take 1 tablet (25 mg total) by mouth every 6 (six) hours as needed for nausea or vomiting.   sertraline 50 MG tablet Commonly known as: ZOLOFT Take 75 mg by mouth daily.

## 2022-12-30 ENCOUNTER — Ambulatory Visit (INDEPENDENT_AMBULATORY_CARE_PROVIDER_SITE_OTHER): Payer: Medicare HMO | Admitting: *Deleted

## 2022-12-30 VITALS — BP 109/73 | HR 70 | Ht 65.0 in | Wt 171.4 lb

## 2022-12-30 DIAGNOSIS — Z3A00 Weeks of gestation of pregnancy not specified: Secondary | ICD-10-CM

## 2022-12-30 DIAGNOSIS — O3680X0 Pregnancy with inconclusive fetal viability, not applicable or unspecified: Secondary | ICD-10-CM

## 2022-12-30 LAB — BETA HCG QUANT (REF LAB): hCG Quant: 677 m[IU]/mL

## 2022-12-30 NOTE — Progress Notes (Signed)
Here for stat bhcg. Denies pain. Denies bleeding. Explained we will draw stat bhcg and have her leave office. She will be called with results in about 2 -4 hours after results received and reviewed by provider. She voices understanding.   Staci Acosta 1210 received bhcg results of 677 and reviewed with Dr. Dione Plover. He confirms appropriate rise and patient should have repeat US in 14 days and  start prenatal care. I called Kayla [name] and informed her of results and plan of care. She states she would like to schedule [place]. I scheduled [place] for first available for 01/17/23.  Staci Acosta

## 2023-01-06 ENCOUNTER — Telehealth: Payer: Self-pay | Admitting: Family Medicine

## 2023-01-06 NOTE — Telephone Encounter (Signed)
Notified pt that she can contact [phone] to reschedule her U/S.  Pt verbalized understanding with no further questions.   Frances Nickels  01/06/23

## 2023-01-06 NOTE — Telephone Encounter (Signed)
Patient called in needing to reschedule her ultrasound due to her starting a new job that day. She is requesting sometime next week and we do not have any available on our floor.

## 2023-01-11 ENCOUNTER — Other Ambulatory Visit: Payer: Self-pay | Admitting: Family Medicine

## 2023-01-11 ENCOUNTER — Ambulatory Visit
Admission: RE | Admit: 2023-01-11 | Discharge: 2023-01-11 | Disposition: A | Discharge status: Home / Self Care | Payer: Medicare HMO | Source: Ambulatory Visit | Attending: Family Medicine | Admitting: Family Medicine

## 2023-01-11 DIAGNOSIS — O3680X0 Pregnancy with inconclusive fetal viability, not applicable or unspecified: Secondary | ICD-10-CM | POA: Insufficient documentation

## 2023-01-12 ENCOUNTER — Telehealth: Payer: Self-pay | Admitting: General Practice

## 2023-01-12 NOTE — Telephone Encounter (Signed)
Called patient, no answer- left message to check mychart account for results.

## 2023-01-12 NOTE — Telephone Encounter (Signed)
-----   Message from Clarnce Flock, MD sent at 01/11/2023  1:26 PM EST ----- Please let patient know about normal viability scan and schedule prenatal care if she is planning to come to [place]

## 2023-01-17 ENCOUNTER — Other Ambulatory Visit: Payer: Medicare HMO

## 2023-01-27 ENCOUNTER — Inpatient Hospital Stay (HOSPITAL_COMMUNITY)
Admission: AD | Admit: 2023-01-27 | Discharge: 2023-01-27 | Disposition: A | Discharge status: Home / Self Care | Payer: Medicare HMO | Attending: Obstetrics & Gynecology | Admitting: Obstetrics & Gynecology

## 2023-01-27 DIAGNOSIS — Z3A01 Less than 8 weeks gestation of pregnancy: Secondary | ICD-10-CM | POA: Insufficient documentation

## 2023-01-27 DIAGNOSIS — N763 Subacute and chronic vulvitis: Secondary | ICD-10-CM | POA: Insufficient documentation

## 2023-01-27 DIAGNOSIS — O161 Unspecified maternal hypertension, first trimester: Secondary | ICD-10-CM | POA: Diagnosis not present

## 2023-01-27 DIAGNOSIS — N898 Other specified noninflammatory disorders of vagina: Secondary | ICD-10-CM | POA: Diagnosis not present

## 2023-01-27 DIAGNOSIS — O23591 Infection of other part of genital tract in pregnancy, first trimester: Secondary | ICD-10-CM | POA: Diagnosis present

## 2023-01-27 DIAGNOSIS — O26891 Other specified pregnancy related conditions, first trimester: Secondary | ICD-10-CM | POA: Insufficient documentation

## 2023-01-27 LAB — URINALYSIS, ROUTINE W REFLEX MICROSCOPIC
Bilirubin Urine: NEGATIVE
Glucose, UA: NEGATIVE mg/dL
Hgb urine dipstick: NEGATIVE
Ketones, ur: NEGATIVE mg/dL
Leukocytes,Ua: NEGATIVE
Nitrite: NEGATIVE
Protein, ur: NEGATIVE mg/dL
Specific Gravity, Urine: 1.018 (ref 1.005–1.030)
pH: 5 (ref 5.0–8.0)

## 2023-01-27 LAB — WET PREP, GENITAL
Clue Cells Wet Prep HPF POC: NONE SEEN
Sperm: NONE SEEN
Trich, Wet Prep: NONE SEEN
WBC, Wet Prep HPF POC: >=10 — AB (ref <10)
Yeast Wet Prep HPF POC: NONE SEEN

## 2023-01-27 NOTE — MAU Note (Signed)
Pt says lower abd pain - started yesterday. Also has Vag D/C and itching - started Wed  Urine has strong smell.  Last sex- Jan

## 2023-01-27 NOTE — MAU Provider Note (Signed)
History     CSN: [id number]  Arrival date and time: 01/27/23 2004   None     Chief Complaint  Patient presents with   Abdominal Pain   Vaginal Itching   Vaginal Discharge   HPI  [name] Is a 32 y.o. female [phone] @ 70w4dhere in MAU with complaints of vaginal discharge. She reports a fishy odor, along with vaginal itching. Symptoms started on Wednesday.   OB History     Gravida  5   Para  1   Term  1   Preterm  0   AB  2   Living  1      SAB  2   IAB  0   Ectopic  0   Multiple  0   Live Births  1           Past Medical History:  Diagnosis Date   Acute respiratory failure with hypoxemia ([facility] 09/19/2017   Acute respiratory failure with hypoxia ([facility])    Arthritis    "hands and legs" (01/08/2015)   CAP (community acquired pneumonia) 01/07/2015   Chronic Respiratory failure with oxygen requirement affecting pregnancy, antepartum 05/16/2016   Resolved pulmonary HTN on 2018 echo (in our epic).   Pt off O2 in 2018.   Daily headache    "sometimes" (01/08/2015)   GERD (gastroesophageal reflux disease)    Hypertension    No longer takes meds   Lung disease    [place]    Multifocal pneumonia    [place]ulmonary hypertension ([place]    S[place]og[place]'s syndrome ([place]    STD (sexually transmitted disease)    Chlamydia    Past Surgical History:  Procedure Laterality Date   CARDIAC CATHETERIZATION N/A 09/09/2015   Procedure: Right Heart Cath;  Surgeon: DLarey Dresser MD;  [facility];  Service: Cardiovascular;  Laterality: N/A;   DILATION AND EVACUATION N/A 07/13/2015   Procedure: DILATATION AND EVACUATION;  Surgeon: JTruett Mainland DO;  [facility];  Service: Gynecology;  Laterality: N/A;   FINGER SURGERY Right 03/2014   "laceration, nerve/artery injury" 2nd digit   VIDEO BRONCHOSCOPY Bilateral 01/12/2015   Procedure: VIDEO BRONCHOSCOPY WITH FLUORO;  Surgeon: RCollene Gobble MD;  [facility]  Service: Cardiopulmonary;   Laterality: Bilateral;    Family History  Problem Relation Age of Onset   Diabetes Mother    Arthritis Father    Cancer Brother        Found in jaw area   Diabetes Maternal Aunt    Diabetes Maternal Grandmother     Social History   Tobacco Use   Smoking status: Former    Packs/day: 0.10    Years: 5.00    Total pack years: 0.50    Types: Cigarettes    Quit date: 11/20/2014    Years since quitting: 8.1   Smokeless tobacco: Never  Vaping Use   Vaping Use: Never used  Substance Use Topics   Alcohol use: Yes    Comment: Occas   Drug use: No    Allergies:  Allergies  Allergen Reactions   Hydrocodone Nausea And Vomiting   Zithromax [Azithromycin] Itching and Cough    Medications Prior to Admission  Medication Sig Dispense Refill Last Dose   albuterol (PROVENTIL HFA;VENTOLIN HFA) 108 (90 Base) MCG/ACT inhaler Inhale 2 puffs into the lungs every 4 (four) hours as needed for wheezing or shortness of breath. 1 Inhaler 5    fluconazole (DIFLUCAN) 150 MG  tablet Take 1 tablet today and 1 tablet 3 days from initiation. 2 tablet 0    fluticasone (CUTIVATE) 0.05 % cream Apply 1 Application topically as needed (as needed).      hydroxychloroquine (PLAQUENIL) 200 MG tablet Take 400 mg by mouth daily.      lidocaine (LIDODERM) 5 % Place 1 patch onto the skin daily as needed. Apply patch to area most significant pain once per day.  Remove and discard patch within 12 hours of application. 30 patch 0    metroNIDAZOLE (METROGEL) 0.75 % vaginal gel Please place 1 applicatorful at bedtime for the next 5 nights. 70 g 0    NONFORMULARY OR COMPOUNDED ITEM Boric Acid Vaginal Suppositories '600mg'$ --insert one suppository twice weekly. (Patient not taking: Reported on 12/30/2022) 24 each 1    nystatin-triamcinolone ointment (MYCOLOG) Apply 1 Application topically 2 (two) times daily. 30 g 0    omeprazole (PRILOSEC) 20 MG capsule Take 20 mg by mouth daily as needed (heartburn).      ondansetron (ZOFRAN)  4 MG tablet Take 1 tablet (4 mg total) by mouth every 8 (eight) hours as needed for nausea or vomiting. 20 tablet 0    oxyCODONE-acetaminophen (PERCOCET) 7.5-325 MG tablet Take 1 tablet by mouth every 8 (eight) hours as needed for severe pain.      predniSONE (DELTASONE) 5 MG tablet Take 5 mg by mouth daily with breakfast.      promethazine (PHENERGAN) 25 MG tablet Take 1 tablet (25 mg total) by mouth every 6 (six) hours as needed for nausea or vomiting. 30 tablet 0    sertraline (ZOLOFT) 50 MG tablet Take 75 mg by mouth daily.      Results for orders placed or performed during the hospital encounter of 01/27/23 (from the past 48 hour(s))  Urinalysis, Routine w reflex microscopic -Urine, Clean Catch     Status: None   Collection Time: 01/27/23  8:29 PM  Result Value Ref Range   Color, Urine YELLOW YELLOW   APPearance CLEAR CLEAR   Specific Gravity, Urine 1.018 1.005 - 1.030   pH 5.0 5.0 - 8.0   Glucose, UA NEGATIVE NEGATIVE mg/dL   Hgb urine dipstick NEGATIVE NEGATIVE   Bilirubin Urine NEGATIVE NEGATIVE   Ketones, ur NEGATIVE NEGATIVE mg/dL   Protein, ur NEGATIVE NEGATIVE mg/dL   Nitrite NEGATIVE NEGATIVE   Leukocytes,Ua NEGATIVE NEGATIVE    Comment: Performed at [facility] [address], [place], [place] [address]  Wet prep, genital     Status: Abnormal   Collection Time: 01/27/23  8:37 PM   Specimen: Urine, Clean Catch  Result Value Ref Range   Yeast Wet Prep HPF POC NONE SEEN NONE SEEN   Trich, Wet Prep NONE SEEN NONE SEEN   Clue Cells Wet Prep HPF POC NONE SEEN NONE SEEN   WBC, Wet Prep HPF POC >=10 (A) <10   Sperm NONE SEEN     Comment: Performed at [facility], [place] [address] [place], [place] [address]     Review of Systems  Gastrointestinal:  Negative for abdominal pain.  Genitourinary:  Positive for vaginal bleeding and vaginal discharge.   Physical Exam   Blood pressure 112/74, pulse 86, temperature 98.8 F (37.1 C), temperature source Oral, resp.  rate 16, height '5\' 3"'$  (1.6 m), weight 80.9 kg, last menstrual period 12/05/2022.  Physical Exam Constitutional:      General: She is not in acute distress.    Appearance: She is  well-developed. She is not ill-appearing, toxic-appearing or diaphoretic.  Neurological:     Mental Status: She is alert and oriented to person, place, and time.  Psychiatric:        Behavior: Behavior normal.    MAU Course  Procedures  MDM  Self swab collected Wet prep and GC pending   Assessment and Plan   A:  1. Vaginal discharge during pregnancy in first trimester   2. [redacted] weeks gestation of pregnancy   3. Subacute vulvitis      P:  Dc home GC pending Return for emergencies only  Noni Saupe I, NP 01/27/2023 9:12 PM

## 2023-01-30 LAB — GC/CHLAMYDIA PROBE AMP (~~LOC~~) NOT AT ARMC
Chlamydia: NEGATIVE
Comment: NEGATIVE
Comment: NORMAL
Neisseria Gonorrhea: NEGATIVE

## 2023-02-16 ENCOUNTER — Telehealth (INDEPENDENT_AMBULATORY_CARE_PROVIDER_SITE_OTHER): Payer: Medicare HMO

## 2023-02-16 DIAGNOSIS — O0990 Supervision of high risk pregnancy, unspecified, unspecified trimester: Secondary | ICD-10-CM

## 2023-02-16 DIAGNOSIS — Z3689 Encounter for other specified antenatal screening: Secondary | ICD-10-CM

## 2023-02-16 MED ORDER — BLOOD PRESSURE MONITORING DEVI: 1.0000 | 0 refills | Status: DC

## 2023-02-16 NOTE — Progress Notes (Signed)
New OB Intake  I connected with Kayla [name]  on 02/16/23 at  2:15 PM EDT by MyChart Video Visit and verified that I am speaking with the correct person using two identifiers. Nurse is located at [facility] and pt is located at home.  I discussed the limitations, risks, security and privacy concerns of performing an evaluation and management service by telephone and the availability of in person appointments. I also discussed with the patient that there may be a patient responsible charge related to this service. The patient expressed understanding and agreed to proceed.  I explained I am completing New OB Intake today. We discussed EDD of 09/11/23 that is based on LMP of 12/05/22. Pt is G5/P1. I reviewed her allergies, medications, Medical/Surgical/OB history, and appropriate screenings. I informed her of [facility] services. [facility] information placed in AVS. Based on history, this is a high risk pregnancy.  Patient Active Problem List   Diagnosis Date Noted   Supervision of high risk pregnancy, antepartum 02/16/2023   Gastroesophageal reflux disease without esophagitis 07/13/2020   ILD (interstitial lung disease) ([place])    History of IUFD 02/14/2017   SS-A antibody positive 06/02/2016   SS-B antibody positive 06/02/2016   Chronic hypertension 03/14/2016   Other secondary pulmonary hypertension ([place]) 09/04/2015   Lupus (systemic lupus erythematosus) ([place]) 08/21/2015   Loud P2 (pulmonary S2, second heart sound) 08/21/2015   Insomnia 08/21/2015   Systemic lupus erythematosus (SLE) affecting pregnancy, antepartum ([place]) 07/06/2015   Sjogren's syndrome ([place]) 04/28/2015    Concerns addressed today  Delivery Plans Plans to deliver at [facility]. Patient given information for [facility] Healthy Baby website for more information about Women's and [place]. Patient is not interested in water birth. Offered upcoming OB visit with CNM to discuss further.  MyChart/Babyscripts MyChart access verified. I  explained pt will have some visits in office and some virtually. Babyscripts instructions given and order placed. Patient verifies receipt of registration text/e-mail. Account successfully created and app downloaded.  Blood Pressure Cuff/Weight Scale Blood pressure cuff ordered for patient to pick-up from [employer]. Explained after first prenatal appt pt will check weekly and document in 20. Patient does have weight scale.  Anatomy US Explained first scheduled [place] will be around 19 weeks. Anatomy US scheduled for 04/19/23 at 0945a. Pt notified to arrive at 0930a.  Labs Discussed Johnsie Cancel genetic screening with patient. Would like both Panorama and Horizon drawn at new OB visit. Routine prenatal labs needed.  COVID Vaccine Patient has had COVID vaccine.   Is patient a CenteringPregnancy candidate?  Not a Candidate  Not a candidate due to Complex coordination of care needed     Is patient a Mom+Baby Combined Care candidate?  Not a candidate   If accepted, Mom+Baby staff notified  Social Determinants of Health Food Insecurity: Patient denies food insecurity. WIC Referral: Patient is interested in referral to [facility].  Transportation: Patient denies transportation needs. Childcare: Discussed no children allowed at ultrasound appointments. Offered childcare services; patient declines childcare services at this time.  Interested in [place]? If yes, send referral and doula dot phrase.   First visit review I reviewed new OB appt with patient. Explained pt will be seen by Dr.Arnold at first visit; encounter routed to appropriate provider. Explained that patient will be seen by pregnancy navigator following visit with provider.   Bethanne Ginger, [place] 02/16/2023  2:57 PM

## 2023-02-23 ENCOUNTER — Ambulatory Visit (INDEPENDENT_AMBULATORY_CARE_PROVIDER_SITE_OTHER): Payer: Medicare HMO | Admitting: Obstetrics & Gynecology

## 2023-02-23 ENCOUNTER — Encounter: Payer: Self-pay | Admitting: Family Medicine

## 2023-02-23 ENCOUNTER — Other Ambulatory Visit: Payer: Self-pay

## 2023-02-23 ENCOUNTER — Encounter: Payer: Self-pay | Admitting: Obstetrics & Gynecology

## 2023-02-23 ENCOUNTER — Other Ambulatory Visit (HOSPITAL_COMMUNITY)
Admission: RE | Admit: 2023-02-23 | Discharge: 2023-02-23 | Disposition: A | Discharge status: Home / Self Care | Payer: Medicare HMO | Source: Ambulatory Visit | Attending: Obstetrics & Gynecology | Admitting: Obstetrics & Gynecology

## 2023-02-23 VITALS — BP 117/79 | HR 100 | Wt 179.0 lb

## 2023-02-23 DIAGNOSIS — Z3A12 12 weeks gestation of pregnancy: Secondary | ICD-10-CM

## 2023-02-23 DIAGNOSIS — I10 Essential (primary) hypertension: Secondary | ICD-10-CM | POA: Diagnosis not present

## 2023-02-23 DIAGNOSIS — N760 Acute vaginitis: Secondary | ICD-10-CM | POA: Insufficient documentation

## 2023-02-23 DIAGNOSIS — Z8759 Personal history of other complications of pregnancy, childbirth and the puerperium: Secondary | ICD-10-CM

## 2023-02-23 DIAGNOSIS — M329 Systemic lupus erythematosus, unspecified: Secondary | ICD-10-CM

## 2023-02-23 DIAGNOSIS — O0990 Supervision of high risk pregnancy, unspecified, unspecified trimester: Secondary | ICD-10-CM | POA: Diagnosis present

## 2023-02-23 DIAGNOSIS — D563 Thalassemia minor: Secondary | ICD-10-CM

## 2023-02-23 DIAGNOSIS — J849 Interstitial pulmonary disease, unspecified: Secondary | ICD-10-CM

## 2023-02-23 DIAGNOSIS — I2729 Other secondary pulmonary hypertension: Secondary | ICD-10-CM

## 2023-02-23 DIAGNOSIS — O99891 Other specified diseases and conditions complicating pregnancy: Secondary | ICD-10-CM

## 2023-02-23 DIAGNOSIS — M3500 Sicca syndrome, unspecified: Secondary | ICD-10-CM

## 2023-02-23 DIAGNOSIS — R768 Other specified abnormal immunological findings in serum: Secondary | ICD-10-CM

## 2023-02-23 DIAGNOSIS — O0991 Supervision of high risk pregnancy, unspecified, first trimester: Secondary | ICD-10-CM

## 2023-02-23 DIAGNOSIS — I340 Nonrheumatic mitral (valve) insufficiency: Secondary | ICD-10-CM | POA: Insufficient documentation

## 2023-02-23 DIAGNOSIS — M3213 Lung involvement in systemic lupus erythematosus: Secondary | ICD-10-CM

## 2023-02-23 DIAGNOSIS — K219 Gastro-esophageal reflux disease without esophagitis: Secondary | ICD-10-CM

## 2023-02-23 MED ORDER — PRENATAL 28-0.8 MG PO TABS
1.0000 | ORAL_TABLET | Freq: Every day | ORAL | 12 refills | Status: AC
Start: 2023-02-23 — End: ?

## 2023-02-23 NOTE — Progress Notes (Signed)
Subjective:    Kayla [name] is a [id number] [redacted]w[redacted]d being seen today for her first obstetrical visit.  Her obstetrical history is significant for  Lupus, lung disease, h/o postpartum complications . Patient does intend to breast feed. Pregnancy history fully reviewed. Acute vaginitis - Plan: Cervicovaginal ancillary only( [place])  Supervision of high risk pregnancy, antepartum - Plan: Culture, OB Urine, Hemoglobin A1c, CBC/D/Plt+RPR+Rh+ABO+RubIgG..., Panorama Prenatal Test Full Panel, HORIZON Basic Panel, CMP14+EGFR, Protein / creatinine ratio, urine, Cervicovaginal ancillary only( [place] ), Prenatal 28-0.8 MG TABS  Systemic lupus erythematosus (SLE) affecting pregnancy, antepartum  Chronic hypertension  Other secondary pulmonary hypertension  ILD (interstitial lung disease)  History of IUFD  Gastroesophageal reflux disease without esophagitis  SS-A antibody positive  SS-B antibody positive  Systemic lupus erythematosus with lung involvement, unspecified SLE type  Sjogren's syndrome, with unspecified organ involvement  Patient reports no complaints.  Vitals:   02/23/23 0943 02/23/23 0948  BP:  117/79  Pulse:  100  Weight: 179 lb (81.2 kg) 179 lb (81.2 kg)    HISTORY: OB History  Gravida Para Term Preterm AB Living  5 2 1 1 2 1   SAB IAB Ectopic Multiple Live Births  2 0 0 0 1    # Outcome Date GA Lbr Len/2nd Weight Sex Delivery Anes PTL Lv  5 Current           4 Term 09/13/17 [redacted]w[redacted]d 179:01 / 00:41 5 lb 13.7 oz (2.655 kg) F Vag-Spont EPI  LIV  3 Preterm 10/05/16   4 lb 15.4 oz (2.25 kg) F   Y FD  2 SAB           1 SAB            Past Medical History:  Diagnosis Date   Acute respiratory failure with hypoxemia 09/19/2017   Acute respiratory failure with hypoxia    Arthritis    "hands and legs" (01/08/2015)   CAP (community acquired pneumonia) 01/07/2015   Chronic Respiratory failure with oxygen requirement affecting pregnancy, antepartum 05/16/2016    Resolved pulmonary HTN on 2018 echo (in our epic).   Pt off O2 in 2018.   Daily headache    "sometimes" (01/08/2015)   GERD (gastroesophageal reflux disease)    Hypertension    No longer takes meds   Lung disease    Lupus    Multifocal pneumonia    Pulmonary hypertension    Sjogren's syndrome    STD (sexually transmitted disease)    Chlamydia   Past Surgical History:  Procedure Laterality Date   CARDIAC CATHETERIZATION N/A 09/09/2015   Procedure: Right Heart Cath;  Surgeon: Larey Dresser, MD;  Location: [place] [facility];  Service: Cardiovascular;  Laterality: N/A;   DILATION AND EVACUATION N/A 07/13/2015   Procedure: DILATATION AND EVACUATION;  Surgeon: Truett Mainland, DO;  Location: [place] [facility];  Service: Gynecology;  Laterality: N/A;   FINGER SURGERY Right 03/2014   "laceration, nerve/artery injury" 2nd digit   VIDEO BRONCHOSCOPY Bilateral 01/12/2015   Procedure: VIDEO BRONCHOSCOPY WITH FLUORO;  Surgeon: Collene Gobble, MD;  Location: [place];  Service: Cardiopulmonary;  Laterality: Bilateral;   Family History  Problem Relation Age of Onset   Diabetes Mother    Arthritis Father    Cancer Brother        Found in jaw area   Diabetes Maternal Aunt    Diabetes Maternal Grandmother      Exam    Uterus:   12  Pelvic  Exam:    Perineum:    Vulva:    Vagina:     pH:    Cervix:    Adnexa:    Bony Pelvis:   System: Breast:  normal appearance, no masses or tenderness   Skin: normal coloration and turgor, no rashes    Neurologic: oriented, normal mood   Extremities: normal strength, tone, and muscle mass   HEENT PERRLA   Mouth/Teeth mucous membranes moist, pharynx normal without lesions and dental hygiene good   Neck supple   Cardiovascular: regular rate and rhythm, no murmurs or gallops   Respiratory:  appears well, vitals normal, no respiratory distress, acyanotic, normal RR, ear and throat exam is normal, chest clear, no wheezing, crepitations, rhonchi, normal  symmetric air entry   Abdomen: soft, non-tender; bowel sounds normal; no masses,  no organomegaly   Urinary:       Assessment:    Pregnancy: [id number] Patient Active Problem List   Diagnosis Date Noted   Supervision of high risk pregnancy, antepartum 02/16/2023   Gastroesophageal reflux disease without esophagitis 07/13/2020   ILD (interstitial lung disease)    History of IUFD 02/14/2017   SS-A antibody positive 06/02/2016   SS-B antibody positive 06/02/2016   Chronic hypertension 03/14/2016   Other secondary pulmonary hypertension 09/04/2015   Lupus (systemic lupus erythematosus) 08/21/2015   Loud P2 (pulmonary S2, second heart sound) 08/21/2015   Insomnia 08/21/2015   Systemic lupus erythematosus (SLE) affecting pregnancy, antepartum 07/06/2015   Sjogren's syndrome 04/28/2015        Plan:     Initial labs drawn. Prenatal vitamins. Problem list reviewed and updated. Genetic Screening discussed : ordered.  Ultrasound discussed; fetal survey: ordered.  Follow up in 4 weeks. 50% of 30 min visit spent on counseling and coordination of care.  Current Outpatient Medications on File Prior to Visit  Medication Sig Dispense Refill   Blood Pressure Monitoring DEVI 1 each by Does not apply route once a week. 1 each 0   Prenatal Vit-Fe Fumarate-FA (MULTIVITAMIN-PRENATAL) 27-0.8 MG TABS tablet Take 1 tablet by mouth daily at 12 noon.     sertraline (ZOLOFT) 50 MG tablet Take 75 mg by mouth daily. (Patient not taking: Reported on 02/23/2023)     [DISCONTINUED] amLODipine (NORVASC) 5 MG tablet Take 1 tablet (5 mg total) by mouth daily.     No current facility-administered medications on file prior to visit.  Red chart    Kayla [name] 02/23/2023

## 2023-02-24 ENCOUNTER — Encounter: Payer: Self-pay | Admitting: *Deleted

## 2023-02-24 LAB — CERVICOVAGINAL ANCILLARY ONLY
Bacterial Vaginitis (gardnerella): NEGATIVE
Candida Glabrata: NEGATIVE
Candida Vaginitis: NEGATIVE
Chlamydia: NEGATIVE
Comment: NEGATIVE
Comment: NEGATIVE
Comment: NEGATIVE
Comment: NEGATIVE
Comment: NEGATIVE
Comment: NORMAL
Neisseria Gonorrhea: NEGATIVE
Trichomonas: NEGATIVE

## 2023-02-25 LAB — CBC/D/PLT+RPR+RH+ABO+RUBIGG...
Antibody Screen: NEGATIVE
Basophils Absolute: 0 10*3/uL (ref 0.0–0.2)
Basos: 0 %
EOS (ABSOLUTE): 0 10*3/uL (ref 0.0–0.4)
Eos: 1 %
HCV Ab: NONREACTIVE
HIV Screen 4th Generation wRfx: NONREACTIVE
Hematocrit: 36.3 % (ref 34.0–46.6)
Hemoglobin: 12.6 g/dL (ref 11.1–15.9)
Hepatitis B Surface Ag: NEGATIVE
Immature Grans (Abs): 0 10*3/uL (ref 0.0–0.1)
Immature Granulocytes: 0 %
Lymphocytes Absolute: 1.8 10*3/uL (ref 0.7–3.1)
Lymphs: 27 %
MCH: 27.2 pg (ref 26.6–33.0)
MCHC: 34.7 g/dL (ref 31.5–35.7)
MCV: 78 fL — ABNORMAL LOW (ref 79–97)
Monocytes Absolute: 0.6 10*3/uL (ref 0.1–0.9)
Monocytes: 10 %
Neutrophils Absolute: 4 10*3/uL (ref 1.4–7.0)
Neutrophils: 62 %
RBC: 4.63 x10E6/uL (ref 3.77–5.28)
RDW: 12.5 % (ref 11.7–15.4)
RPR Ser Ql: NONREACTIVE
Rh Factor: POSITIVE
Rubella Antibodies, IGG: 4.29 index (ref >0.99)
WBC: 6.4 10*3/uL (ref 3.4–10.8)

## 2023-02-25 LAB — CMP14+EGFR
ALT: 18 IU/L (ref 0–32)
AST: 22 IU/L (ref 0–40)
Albumin/Globulin Ratio: 1.6 (ref 1.2–2.2)
Albumin: 4.1 g/dL (ref 3.9–4.9)
Alkaline Phosphatase: 75 IU/L (ref 44–121)
BUN/Creatinine Ratio: 11 (ref 9–23)
BUN: 6 mg/dL (ref 6–20)
Bilirubin Total: <0.2 mg/dL (ref 0.0–1.2)
CO2: 21 mmol/L (ref 20–29)
Calcium: 9.5 mg/dL (ref 8.7–10.2)
Chloride: 102 mmol/L (ref 96–106)
Creatinine, Ser: 0.54 mg/dL — ABNORMAL LOW (ref 0.57–1.00)
Globulin, Total: 2.6 g/dL (ref 1.5–4.5)
Glucose: 71 mg/dL (ref 70–99)
Potassium: 4.1 mmol/L (ref 3.5–5.2)
Sodium: 137 mmol/L (ref 134–144)
Total Protein: 6.7 g/dL (ref 6.0–8.5)
eGFR: 125 mL/min/{1.73_m2} (ref >59)

## 2023-02-25 LAB — CULTURE, OB URINE

## 2023-02-25 LAB — PROTEIN / CREATININE RATIO, URINE
Creatinine, Urine: 202.9 mg/dL
Protein, Ur: 15.4 mg/dL
Protein/Creat Ratio: 76 mg/g creat (ref 0–200)

## 2023-02-25 LAB — HEMOGLOBIN A1C
Est. average glucose Bld gHb Est-mCnc: 120 mg/dL
Hgb A1c MFr Bld: 5.8 % — ABNORMAL HIGH (ref 4.8–5.6)

## 2023-02-25 LAB — URINE CULTURE, OB REFLEX

## 2023-02-25 LAB — HCV INTERPRETATION

## 2023-02-27 ENCOUNTER — Encounter: Payer: Self-pay | Admitting: Obstetrics & Gynecology

## 2023-03-02 ENCOUNTER — Ambulatory Visit (HOSPITAL_COMMUNITY)
Admission: EM | Admit: 2023-03-02 | Discharge: 2023-03-02 | Disposition: A | Discharge status: Home / Self Care | Payer: Medicare HMO | Attending: Emergency Medicine | Admitting: Emergency Medicine

## 2023-03-02 ENCOUNTER — Encounter (HOSPITAL_COMMUNITY): Payer: Self-pay

## 2023-03-02 DIAGNOSIS — B349 Viral infection, unspecified: Secondary | ICD-10-CM

## 2023-03-02 DIAGNOSIS — J302 Other seasonal allergic rhinitis: Secondary | ICD-10-CM

## 2023-03-02 HISTORY — DX: Viral infection, unspecified: B34.9

## 2023-03-02 HISTORY — DX: Other seasonal allergic rhinitis: J30.2

## 2023-03-02 LAB — POCT RAPID STREP A, ED / UC: Streptococcus, Group A Screen (Direct): NEGATIVE

## 2023-03-02 NOTE — Discharge Instructions (Addendum)
Your strep test was negative, culture pending. May take tylenol, claritin, cepacol throat lozenges, flonase nasal spray, saline nasal spray as label directed. Please follow up with PCP/OB regarding further management. Drink plenty of fluids to stay hydrated. Return as needed.

## 2023-03-02 NOTE — ED Triage Notes (Addendum)
Patient c/o a sosre throat, nasal congestion with blood -tinged drainage, sore throat, otalgia, and cough that is mostly at night x 4 days.  Patient is 12 weeks and 4 days pregnant.  Patient denies taking any medications for her symptoms.

## 2023-03-02 NOTE — ED Provider Notes (Signed)
[facility]    CSN: [id number] Arrival date & time: 03/02/23  0786      History   Chief Complaint Chief Complaint  Patient presents with   Cough   Nasal Congestion   Sore Throat   Otalgia    HPI Kayla [name] is a 32 y.o. female.   32 year old female, Kayla [name], presents to ER with chief complaint of sore throat and nasal congestion ear pain and cough x 4 days.  Patient states son recently had strep thinks she was exposed would like to be checked.  Patient has not taking any meds for symptom management.  Vital signs are stable in urgent care, afebrile.  Patient is proximately 12-1/[redacted] weeks pregnant.  The history is provided by the patient. No language interpreter was used.    Past Medical History:  Diagnosis Date   Acute respiratory failure with hypoxemia 09/19/2017   Acute respiratory failure with hypoxia    Arthritis    "hands and legs" (01/08/2015)   CAP (community acquired pneumonia) 01/07/2015   Chronic Respiratory failure with oxygen requirement affecting pregnancy, antepartum 05/16/2016   Resolved pulmonary HTN on 2018 echo (in our epic).   Pt off O2 in 2018.   Daily headache    "sometimes" (01/08/2015)   GERD (gastroesophageal reflux disease)    Hypertension    No longer takes meds   Lung disease    Lupus    Multifocal pneumonia    Pulmonary hypertension    Sjogren's syndrome    STD (sexually transmitted disease)    Chlamydia    Patient Active Problem List   Diagnosis Date Noted   Seasonal allergies 03/02/2023   Viral illness 03/02/2023   Mitral valve regurgitation 02/23/2023   Supervision of high risk pregnancy, antepartum 02/16/2023   Gastroesophageal reflux disease without esophagitis 07/13/2020   ILD (interstitial lung disease)    History of IUFD 02/14/2017   SS-A antibody positive 06/02/2016   SS-B antibody positive 06/02/2016   Chronic hypertension 03/14/2016   Other secondary pulmonary hypertension 09/04/2015   Lupus  (systemic lupus erythematosus) 08/21/2015   Loud P2 (pulmonary S2, second heart sound) 08/21/2015   Insomnia 08/21/2015   Systemic lupus erythematosus (SLE) affecting pregnancy, antepartum 07/06/2015   Sjogren's syndrome 04/28/2015    Past Surgical History:  Procedure Laterality Date   CARDIAC CATHETERIZATION N/A 09/09/2015   Procedure: Right Heart Cath;  Surgeon: Laurey Morale, MD;  Location: [facility];  Service: Cardiovascular;  Laterality: N/A;   DILATION AND EVACUATION N/A 07/13/2015   Procedure: DILATATION AND EVACUATION;  Surgeon: Levie Heritage, DO;  Location: [facility];  Service: Gynecology;  Laterality: N/A;   FINGER SURGERY Right 03/2014   "laceration, nerve/artery injury" 2nd digit   VIDEO BRONCHOSCOPY Bilateral 01/12/2015   Procedure: VIDEO BRONCHOSCOPY WITH FLUORO;  Surgeon: Leslye Peer, MD;  Location: [facility];  Service: Cardiopulmonary;  Laterality: Bilateral;    OB History     Gravida  5   Para  2   Term  1   Preterm  1   AB  2   Living  1      SAB  2   IAB  0   Ectopic  0   Multiple  0   Live Births  1            Home Medications    Prior to Admission medications   Medication Sig Start Date End Date Taking? Authorizing Provider  Blood Pressure  Monitoring DEVI 1 each by Does not apply route once a week. 02/16/23   Adam PhenixArnold, James G, MD  Prenatal 28-0.8 MG TABS Take 1 tablet by mouth daily. 02/23/23   Adam PhenixArnold, James G, MD  Prenatal Vit-Fe Fumarate-FA (MULTIVITAMIN-PRENATAL) 27-0.8 MG TABS tablet Take 1 tablet by mouth daily at 12 noon.    [provider]  sertraline (ZOLOFT) 50 MG tablet Take 75 mg by mouth daily. Patient not taking: Reported on 02/23/2023 02/15/21 12/30/22  [provider]  amLODipine (NORVASC) 5 MG tablet Take 1 tablet (5 mg total) by mouth daily. 09/22/17 11/09/20  Duayne CalHoffman, Paul W, NP    Family History Family History  Problem Relation Age of Onset   Diabetes Mother    Arthritis Father    Cancer  Brother        Found in jaw area   Diabetes Maternal Aunt    Diabetes Maternal Grandmother     Social History Social History   Tobacco Use   Smoking status: Former    Packs/day: 0.10    Years: 5.00    Additional pack years: 0.00    Total pack years: 0.50    Types: Cigarettes    Quit date: 11/20/2014    Years since quitting: 8.2   Smokeless tobacco: Never  Vaping Use   Vaping Use: Never used  Substance Use Topics   Alcohol use: Yes    Comment: Occas   Drug use: No     Allergies   Hydrocodone and Zithromax [azithromycin]   Review of Systems Review of Systems  Constitutional:  Negative for fever.  HENT:  Positive for congestion, ear pain and sore throat.   Respiratory:  Positive for cough. Negative for wheezing.   Cardiovascular:  Negative for chest pain and palpitations.  Gastrointestinal:  Negative for abdominal pain.  Genitourinary:  Negative for dysuria.  Musculoskeletal:  Negative for back pain.  All other systems reviewed and are negative.    Physical Exam Triage Vital Signs ED Triage Vitals  Enc Vitals Group     BP 03/02/23 0813 107/73     Pulse Rate 03/02/23 0813 83     Resp 03/02/23 0813 14     Temp 03/02/23 0813 98.8 F (37.1 C)     Temp Source 03/02/23 0813 Oral     SpO2 03/02/23 0813 98 %     Weight --      Height --      Head Circumference --      Peak Flow --      Pain Score 03/02/23 0814 6     Pain Loc --      Pain Edu? --      Excl. in GC? --    No data found.  Updated Vital Signs BP 107/73 (BP Location: Left Arm)   Pulse 83   Temp 98.8 F (37.1 C) (Oral)   Resp 14   LMP 12/05/2022 (Approximate)   SpO2 98%   Visual Acuity Right Eye Distance:   Left Eye Distance:   Bilateral Distance:    Right Eye Near:   Left Eye Near:    Bilateral Near:     Physical Exam Vitals and nursing note reviewed.  Constitutional:      General: She is not in acute distress.    Appearance: She is well-developed and well-groomed.  HENT:      Head: Normocephalic.     Right Ear: Tympanic membrane is retracted.     Left Ear: Tympanic membrane is  retracted.     Nose: Congestion present.     Mouth/Throat:     Lips: Pink.     Mouth: Mucous membranes are moist.     Pharynx: Oropharynx is clear.  Eyes:     General: Lids are normal.     Extraocular Movements: Extraocular movements intact.     Conjunctiva/sclera: Conjunctivae normal.     Pupils: Pupils are equal, round, and reactive to light.  Neck:     Trachea: Trachea normal. No tracheal deviation.  Cardiovascular:     Rate and Rhythm: Normal rate and regular rhythm.     Pulses: Normal pulses.     Heart sounds: Normal heart sounds. No murmur heard. Pulmonary:     Effort: Pulmonary effort is normal.     Breath sounds: Normal breath sounds and air entry.  Abdominal:     General: Bowel sounds are normal.     Palpations: Abdomen is soft.     Tenderness: There is no abdominal tenderness.  Musculoskeletal:        General: Normal range of motion.     Cervical back: Normal range of motion and neck supple.  Lymphadenopathy:     Cervical: No cervical adenopathy.  Skin:    General: Skin is warm and dry.     Findings: No rash.  Neurological:     General: No focal deficit present.     Mental Status: She is alert and oriented to person, place, and time.     GCS: GCS eye subscore is 4. GCS verbal subscore is 5. GCS motor subscore is 6.  Psychiatric:        Attention and Perception: Attention normal.        Mood and Affect: Mood normal.        Speech: Speech normal.        Behavior: Behavior normal. Behavior is cooperative.      UC Treatments / Results  Labs (all labs ordered are listed, but only abnormal results are displayed) Labs Reviewed  CULTURE, GROUP A STREP [facility])  POCT RAPID STREP A, ED / UC    EKG   Radiology No results found.  Procedures Procedures (including critical care time)  Medications Ordered in UC Medications - No data to display  Initial  Impression / Assessment and Plan / UC Course  I have reviewed the triage vital signs and the nursing notes.  Pertinent labs & imaging results that were available during my care of the patient were reviewed by me and considered in my medical decision making (see chart for details).     Patient verbalized understanding results and plan of care.  Ddx: Seasonal allergies, viral illness, strep Final Clinical Impressions(s) / UC Diagnoses   Final diagnoses:  Seasonal allergies  Viral illness     Discharge Instructions      Your strep test was negative, culture pending. May take tylenol, claritin, cepacol throat lozenges, flonase nasal spray, saline nasal spray as label directed. Please follow up with PCP/OB regarding further management. Drink plenty of fluids to stay hydrated. Return as needed.      ED Prescriptions   None    PDMP not reviewed this encounter.   Clancy Gourd, NP 03/02/23 [phone]

## 2023-03-03 LAB — PANORAMA PRENATAL TEST FULL PANEL:PANORAMA TEST PLUS 5 ADDITIONAL MICRODELETIONS: FETAL FRACTION: 7.8

## 2023-03-05 LAB — CULTURE, GROUP A STREP (THRC)

## 2023-03-06 LAB — HORIZON CUSTOM: REPORT SUMMARY: POSITIVE — AB

## 2023-03-11 DIAGNOSIS — D563 Thalassemia minor: Secondary | ICD-10-CM | POA: Insufficient documentation

## 2023-03-15 ENCOUNTER — Other Ambulatory Visit: Payer: Self-pay | Admitting: Family Medicine

## 2023-03-15 DIAGNOSIS — M329 Systemic lupus erythematosus, unspecified: Secondary | ICD-10-CM

## 2023-03-15 DIAGNOSIS — J849 Interstitial pulmonary disease, unspecified: Secondary | ICD-10-CM

## 2023-03-16 ENCOUNTER — Other Ambulatory Visit: Payer: Self-pay

## 2023-03-16 ENCOUNTER — Ambulatory Visit (INDEPENDENT_AMBULATORY_CARE_PROVIDER_SITE_OTHER): Payer: Medicare HMO | Admitting: Family Medicine

## 2023-03-16 VITALS — BP 96/67 | HR 84 | Wt 180.0 lb

## 2023-03-16 DIAGNOSIS — O99891 Other specified diseases and conditions complicating pregnancy: Secondary | ICD-10-CM

## 2023-03-16 DIAGNOSIS — O0990 Supervision of high risk pregnancy, unspecified, unspecified trimester: Secondary | ICD-10-CM

## 2023-03-16 DIAGNOSIS — I10 Essential (primary) hypertension: Secondary | ICD-10-CM

## 2023-03-16 DIAGNOSIS — R7309 Other abnormal glucose: Secondary | ICD-10-CM

## 2023-03-16 DIAGNOSIS — I2729 Other secondary pulmonary hypertension: Secondary | ICD-10-CM

## 2023-03-16 DIAGNOSIS — Z3A15 15 weeks gestation of pregnancy: Secondary | ICD-10-CM

## 2023-03-16 DIAGNOSIS — Z8759 Personal history of other complications of pregnancy, childbirth and the puerperium: Secondary | ICD-10-CM

## 2023-03-16 DIAGNOSIS — O0992 Supervision of high risk pregnancy, unspecified, second trimester: Secondary | ICD-10-CM

## 2023-03-16 DIAGNOSIS — M329 Systemic lupus erythematosus, unspecified: Secondary | ICD-10-CM

## 2023-03-16 MED ORDER — ASPIRIN 81 MG PO TBEC
81.0000 mg | DELAYED_RELEASE_TABLET | Freq: Every day | ORAL | 12 refills | Status: AC
Start: 2023-03-16 — End: ?

## 2023-03-16 NOTE — Progress Notes (Addendum)
    PRENATAL VISIT NOTE  Subjective:  Kayla [name] is a 32 y.o. [id number] at [redacted]w[redacted]d being seen today for ongoing prenatal care.  She is currently monitored for the following issues for this high-risk pregnancy and has Systemic lupus erythematosus (SLE) affecting pregnancy, antepartum; Lupus (systemic lupus erythematosus); Loud P2 (pulmonary S2, second heart sound); Insomnia; Other secondary pulmonary hypertension; Chronic hypertension; Sjogren's syndrome; SS-A antibody positive; SS-B antibody positive; History of IUFD; ILD (interstitial lung disease); Gastroesophageal reflux disease without esophagitis; Supervision of high risk pregnancy, antepartum; Mitral valve regurgitation; Seasonal allergies; Viral illness; and Alpha thalassemia silent carrier on their problem list.  Patient reports no bleeding, no contractions, no cramping, and no leaking.  Contractions: Not present. Vag. Bleeding: None.  Movement: Absent. Denies leaking of fluid.   The following portions of the patient's history were reviewed and updated as appropriate: allergies, current medications, past family history, past medical history, past social history, past surgical history and problem list.   Objective:   Vitals:   03/16/23 0858  BP: 96/67  Pulse: 84  Weight: 180 lb (81.6 kg)    Fetal Status: Fetal Heart Rate (bpm): 156   Movement: Absent     General:  Alert, oriented and cooperative. Patient is in no acute distress.  Skin: Skin is warm and dry. No rash noted.   Cardiovascular: Normal heart rate noted  Respiratory: Normal respiratory effort, no problems with respiration noted  Abdomen: Soft, gravid, appropriate for gestational age.  Pain/Pressure: Absent     Pelvic: Cervical exam deferred        Extremities: Normal range of motion.  Edema: None  Mental Status: Normal mood and affect. Normal behavior. Normal judgment and thought content.   Assessment and Plan:  Pregnancy: [name] at [redacted]w[redacted]d 1. Supervision of high risk  pregnancy, antepartum Continue routine prenatal care - AFP, Serum, Open Spina Bifida - Glucose Tolerance, 2 Hours w/1 Hour  2. Systemic lupus erythematosus (SLE) affecting pregnancy, antepartum Patient currently prescribed Plaquenil, prednisone, azathioprine for SLE.  Scheduled to meet with rheumatologist in the near future.  Discussed restarting Plaquenil and prednisone which patient is agreeable to.  She is not sure if she wants to restart azathioprine.  She will speak with rheumatology and determine need for that.  3. Chronic hypertension Blood pressure within normal limits today.  Patient sent prescription for ASA 81 mg daily.  4. History of IUFD Patient prescribed 81 mg ASA daily  5. Other secondary pulmonary hypertension  6. Elevated hemoglobin A1c Early GTT ordered - Glucose Tolerance, 2 Hours w/1 Hour  Preterm labor symptoms and general obstetric precautions including but not limited to vaginal bleeding, contractions, leaking of fluid and fetal movement were reviewed in detail with the patient. Please refer to After Visit Summary for other counseling recommendations.   No follow-ups on file.  Future Appointments  Date Time Provider Department Center  04/10/2023 10:15 AM Adam Phenix, MD [facility]  04/19/2023  9:30 AM [facility] NURSE [facility]  04/19/2023  9:45 AM [facility] US5 [facility]    Celedonio Savage, MD

## 2023-03-17 LAB — GLUCOSE TOLERANCE, 2 HOURS W/ 1HR
Glucose, 1 hour: 138 mg/dL (ref 70–179)
Glucose, 2 hour: 128 mg/dL (ref 70–152)
Glucose, Fasting: 79 mg/dL (ref 70–91)

## 2023-03-18 LAB — AFP, SERUM, OPEN SPINA BIFIDA
AFP MoM: 0.78
AFP Value: 22.6 ng/mL
Gest. Age on Collection Date: 15 weeks
Maternal Age At EDD: 33.6 yr
OSBR Risk 1 IN: 10000
Test Results:: NEGATIVE
Weight: 180 [lb_av]

## 2023-03-23 ENCOUNTER — Inpatient Hospital Stay (HOSPITAL_COMMUNITY)
Admission: AD | Admit: 2023-03-23 | Discharge: 2023-03-23 | Disposition: A | Discharge status: Home / Self Care | Payer: Medicare HMO | Attending: Obstetrics and Gynecology | Admitting: Obstetrics and Gynecology

## 2023-03-23 ENCOUNTER — Encounter (HOSPITAL_COMMUNITY): Payer: Self-pay | Admitting: Obstetrics and Gynecology

## 2023-03-23 ENCOUNTER — Other Ambulatory Visit: Payer: Self-pay

## 2023-03-23 DIAGNOSIS — Z3A16 16 weeks gestation of pregnancy: Secondary | ICD-10-CM | POA: Insufficient documentation

## 2023-03-23 DIAGNOSIS — B9689 Other specified bacterial agents as the cause of diseases classified elsewhere: Secondary | ICD-10-CM | POA: Insufficient documentation

## 2023-03-23 DIAGNOSIS — N898 Other specified noninflammatory disorders of vagina: Secondary | ICD-10-CM | POA: Diagnosis present

## 2023-03-23 DIAGNOSIS — O23592 Infection of other part of genital tract in pregnancy, second trimester: Secondary | ICD-10-CM | POA: Insufficient documentation

## 2023-03-23 DIAGNOSIS — Z0371 Encounter for suspected problem with amniotic cavity and membrane ruled out: Secondary | ICD-10-CM | POA: Diagnosis not present

## 2023-03-23 DIAGNOSIS — O0990 Supervision of high risk pregnancy, unspecified, unspecified trimester: Secondary | ICD-10-CM

## 2023-03-23 DIAGNOSIS — O99891 Other specified diseases and conditions complicating pregnancy: Secondary | ICD-10-CM | POA: Diagnosis present

## 2023-03-23 DIAGNOSIS — N760 Acute vaginitis: Secondary | ICD-10-CM | POA: Diagnosis not present

## 2023-03-23 LAB — WET PREP, GENITAL
Clue Cells Wet Prep HPF POC: NONE SEEN
Sperm: NONE SEEN
Trich, Wet Prep: NONE SEEN
WBC, Wet Prep HPF POC: >=10 — AB (ref <10)
Yeast Wet Prep HPF POC: NONE SEEN

## 2023-03-23 LAB — URINALYSIS, ROUTINE W REFLEX MICROSCOPIC
Bilirubin Urine: NEGATIVE
Glucose, UA: NEGATIVE mg/dL
Hgb urine dipstick: NEGATIVE
Ketones, ur: NEGATIVE mg/dL
Leukocytes,Ua: NEGATIVE
Nitrite: NEGATIVE
Protein, ur: NEGATIVE mg/dL
Specific Gravity, Urine: 1.017 (ref 1.005–1.030)
pH: 5 (ref 5.0–8.0)

## 2023-03-23 MED ORDER — METRONIDAZOLE 500 MG PO TABS: 500.0000 mg | ORAL_TABLET | Freq: Two times a day (BID) | ORAL | 0 refills | Status: DC

## 2023-03-23 NOTE — MAU Note (Signed)
Kayla [name] is a 32 y.o. at [redacted]w[redacted]d here in MAU reporting: she's unsure if she is leaking fluid versus having heavy discharge but panty liners have been really wet for past 2 days.  Reports has strong odor but not foul, but did smell fishy once.  Denies VB. LMP: NA Onset of complaint: 2 days ago Pain score: 0 Vitals:   03/23/23 0807  BP: 118/62  Pulse: 80  Temp: 98.1 F (36.7 C)  SpO2: 98%     FHT:156 bpm Lab orders placed from triage:   None

## 2023-03-23 NOTE — MAU Provider Note (Signed)
History     CSN: [id number]  Arrival date and time: 03/23/23 0750   Event Date/Time   First Provider Initiated Contact with Patient 03/23/23 [phone]      Chief Complaint  Patient presents with   Vaginal Discharge   Kayla [name] is a 32 y.o. [id number] at [redacted]w[redacted]d who receives care at [facility].  She presents today for leaking of fluid.  She states for the past 2 days her pantyliners have "been super wet and even when I don't have on one, my underwear are wet."  She states earlier this week it was a discharge with a fishy odor that changed to an ammonia smell.  She states currently it is "a strong smell."  She denies recent sexual activity or vaginal bleeding.    OB History     Gravida  5   Para  2   Term  1   Preterm  1   AB  2   Living  1      SAB  2   IAB  0   Ectopic  0   Multiple  0   Live Births  1           Past Medical History:  Diagnosis Date   Acute respiratory failure with hypoxemia (HCC) 09/19/2017   Acute respiratory failure with hypoxia (HCC)    Arthritis    "hands and legs" (01/08/2015)   CAP (community acquired pneumonia) 01/07/2015   Chronic Respiratory failure with oxygen requirement affecting pregnancy, antepartum 05/16/2016   Resolved pulmonary HTN on 2018 echo (in our epic).   Pt off O2 in 2018.   Daily headache    "sometimes" (01/08/2015)   GERD (gastroesophageal reflux disease)    Hypertension    No longer takes meds   Lung disease    Lupus (HCC)    Multifocal pneumonia    Pulmonary hypertension (HCC)    Sjogren's syndrome (HCC)    STD (sexually transmitted disease)    Chlamydia    Past Surgical History:  Procedure Laterality Date   CARDIAC CATHETERIZATION N/A 09/09/2015   Procedure: Right Heart Cath;  Surgeon: Laurey Morale, MD;  Location: [facility] INVASIVE CV LAB;  Service: Cardiovascular;  Laterality: N/A;   DILATION AND EVACUATION N/A 07/13/2015   Procedure: DILATATION AND EVACUATION;  Surgeon: Levie Heritage, DO;  Location: [facility]  ORS;  Service: Gynecology;  Laterality: N/A;   FINGER SURGERY Right 03/2014   "laceration, nerve/artery injury" 2nd digit   VIDEO BRONCHOSCOPY Bilateral 01/12/2015   Procedure: VIDEO BRONCHOSCOPY WITH FLUORO;  Surgeon: Leslye Peer, MD;  Location: [facility] ENDOSCOPY;  Service: Cardiopulmonary;  Laterality: Bilateral;    Family History  Problem Relation Age of Onset   Diabetes Mother    Arthritis Father    Cancer Brother        Found in jaw area   Diabetes Maternal Aunt    Diabetes Maternal Grandmother     Social History   Tobacco Use   Smoking status: Former    Packs/day: 0.10    Years: 5.00    Additional pack years: 0.00    Total pack years: 0.50    Types: Cigarettes    Quit date: 11/20/2014    Years since quitting: 8.3   Smokeless tobacco: Never  Vaping Use   Vaping Use: Never used  Substance Use Topics   Alcohol use: Yes    Comment: Occas   Drug use: No    Allergies:  Allergies  Allergen Reactions  Hydrocodone Nausea And Vomiting   Zithromax [Azithromycin] Itching and Cough    Medications Prior to Admission  Medication Sig Dispense Refill Last Dose   aspirin EC 81 MG tablet Take 1 tablet (81 mg total) by mouth daily. Swallow whole. 30 tablet 12 03/22/2023   predniSONE (DELTASONE) 5 MG tablet Take 5 mg by mouth daily with breakfast.   03/22/2023   Prenatal 28-0.8 MG TABS Take 1 tablet by mouth daily. 30 tablet 12 03/22/2023   Blood Pressure Monitoring DEVI 1 each by Does not apply route once a week. 1 each 0    hydroxychloroquine (PLAQUENIL) 200 MG tablet Take 200 mg by mouth 2 (two) times daily. (Patient not taking: Reported on 03/16/2023)      Prenatal Vit-Fe Fumarate-FA (MULTIVITAMIN-PRENATAL) 27-0.8 MG TABS tablet Take 1 tablet by mouth daily at 12 noon. (Patient not taking: Reported on 03/16/2023)      sertraline (ZOLOFT) 50 MG tablet Take 75 mg by mouth daily. (Patient not taking: Reported on 02/23/2023)       Review of Systems  Gastrointestinal:  Negative for  abdominal pain, constipation, diarrhea, nausea and vomiting.  Genitourinary:  Positive for frequency (Urge with no output). Negative for difficulty urinating and dysuria.   Physical Exam   Blood pressure 118/62, pulse 80, temperature 98.1 F (36.7 C), temperature source Oral, height 5\' 3"  (1.6 m), weight 82.9 kg, last menstrual period 12/05/2022, SpO2 98 %.  Physical Exam Vitals reviewed. Exam conducted with a chaperone present.  Constitutional:      Appearance: Normal appearance.  HENT:     Head: Normocephalic and atraumatic.  Eyes:     Conjunctiva/sclera: Conjunctivae normal.  Cardiovascular:     Rate and Rhythm: Normal rate.  Abdominal:     General: Bowel sounds are normal.  Genitourinary:    General: Normal vulva.     Comments:  Speculum Exam: -Clitoral piercing, otherwise normal EFG. Malodor apparent. -Vaginal Vault: Pink mucosa with good rugae. Small amt white discharge -wet prep collected -Cervix:Pink, no lesions, cysts, or polyps.  Appears closed. No active bleeding from os -Bimanual Exam:  Deferred Musculoskeletal:     Cervical back: Normal range of motion.  Skin:    General: Skin is warm and dry.  Neurological:     Mental Status: She is alert and oriented to person, place, and time.  Psychiatric:        Mood and Affect: Mood normal.        Behavior: Behavior normal.     MAU Course  Procedures No results found for this or any previous visit (from the past 24 hour(s)).  MDM Pelvic Exam Wet Prep and UA Prescription Assessment and Plan  32 year old, [id number]  SIUP at 16 weeks Bacterial Vaginosis No leakage of AF  -Reviewed POC with patient. -Exam performed and findings discussed.  -Informed that findings c/w bacterial vaginosis. -Discussed sending treatment to pharmacy on file. Patient prefers oral administration. -Rx for metronidazole sent. -Informed that if other results return of concern, will send mychart message. -Patient without  questions. -Precautions reviewed.  -Encouraged to call primary office or return to MAU if symptoms worsen or with the onset of new symptoms. -Discharged to home in stable condition.  Cherre Robins 03/23/2023, 8:46 AM

## 2023-03-27 ENCOUNTER — Telehealth: Payer: Self-pay

## 2023-03-27 NOTE — Telephone Encounter (Signed)
Called pt to set up appointment. No answer. Left message for her to return the call.

## 2023-03-31 ENCOUNTER — Ambulatory Visit (INDEPENDENT_AMBULATORY_CARE_PROVIDER_SITE_OTHER): Payer: Medicare HMO | Admitting: Cardiology

## 2023-03-31 ENCOUNTER — Encounter: Payer: Self-pay | Admitting: Cardiology

## 2023-03-31 VITALS — BP 112/62 | HR 97 | Ht 63.0 in | Wt 183.1 lb

## 2023-03-31 DIAGNOSIS — J8410 Pulmonary fibrosis, unspecified: Secondary | ICD-10-CM | POA: Diagnosis not present

## 2023-03-31 DIAGNOSIS — Z8679 Personal history of other diseases of the circulatory system: Secondary | ICD-10-CM | POA: Diagnosis not present

## 2023-03-31 NOTE — Patient Instructions (Signed)
Medication Instructions:  Your physician recommends that you continue on your current medications as directed. Please refer to the Current Medication list given to you today.  *If you need a refill on your cardiac medications before your next appointment, please call your pharmacy*   Lab Work: None   Testing/Procedures: Your physician has requested that you have an echocardiogram. Echocardiography is a painless test that uses sound waves to create images of your heart. It provides your doctor with information about the size and shape of your heart and how well your heart's chambers and valves are working. This procedure takes approximately one hour. There are no restrictions for this procedure. Please do NOT wear cologne, perfume, aftershave, or lotions (deodorant is allowed). Please arrive 15 minutes prior to your appointment time.    Follow-Up: At [facility], you and your health needs are our priority.  As part of our continuing mission to provide you with exceptional heart care, we have created designated Provider Care Teams.  These Care Teams include your primary Cardiologist (physician) and Advanced Practice Providers (APPs -  Physician Assistants and Nurse Practitioners) who all work together to provide you with the care you need, when you need it.  Your next appointment:   12 week(s)  Provider:   Thomasene Ripple, DO [address], [place], [place] [address]

## 2023-03-31 NOTE — Progress Notes (Signed)
Cardio-Obstetrics Clinic  New Evaluation  Date:  03/31/2023   ID:  Kayla [name], DOB June 28, 1991, MRN [id number]  PCP:  Salli Real, MD   [facility] Providers Cardiologist:  None  Electrophysiologist:  None       Referring MD: Salli Real, MD   Chief Complaint: " I am doing well"  History of Present Illness:    Kayla [name] is a 32 y.o. female [G5P1121] who is being seen today for the evaluation of hx of hypertension at the request of Sun, Yun, MD.   Medical hx with pulmonary fibrosis, Hypertension, Lupus and GERD her for to establish cardiac care in pregnancy.  She offers no complaints today. She is [redacted] weeks pregnant  Prior CV Studies Reviewed: The following studies were reviewed today:   Past Medical History:  Diagnosis Date   Acute respiratory failure with hypoxemia (HCC) 09/19/2017   Acute respiratory failure with hypoxia (HCC)    Arthritis    "hands and legs" (01/08/2015)   CAP (community acquired pneumonia) 01/07/2015   Chronic Respiratory failure with oxygen requirement affecting pregnancy, antepartum 05/16/2016   Resolved pulmonary HTN on 2018 echo (in our epic).   Pt off O2 in 2018.   Daily headache    "sometimes" (01/08/2015)   GERD (gastroesophageal reflux disease)    Hypertension    No longer takes meds   Lung disease    Lupus (HCC)    Multifocal pneumonia    Pulmonary hypertension (HCC)    Sjogren's syndrome (HCC)    STD (sexually transmitted disease)    Chlamydia    Past Surgical History:  Procedure Laterality Date   CARDIAC CATHETERIZATION N/A 09/09/2015   Procedure: Right Heart Cath;  Surgeon: Laurey Morale, MD;  Location: [facility] INVASIVE CV LAB;  Service: Cardiovascular;  Laterality: N/A;   DILATION AND EVACUATION N/A 07/13/2015   Procedure: DILATATION AND EVACUATION;  Surgeon: Levie Heritage, DO;  Location: [facility] ORS;  Service: Gynecology;  Laterality: N/A;   FINGER SURGERY Right 03/2014   "laceration, nerve/artery injury" 2nd digit    VIDEO BRONCHOSCOPY Bilateral 01/12/2015   Procedure: VIDEO BRONCHOSCOPY WITH FLUORO;  Surgeon: Leslye Peer, MD;  Location: [facility] ENDOSCOPY;  Service: Cardiopulmonary;  Laterality: Bilateral;      OB History     Gravida  5   Para  2   Term  1   Preterm  1   AB  2   Living  1      SAB  2   IAB  0   Ectopic  0   Multiple  0   Live Births  1               Current Medications: Current Meds  Medication Sig   aspirin EC 81 MG tablet Take 1 tablet (81 mg total) by mouth daily. Swallow whole.   Blood Pressure Monitoring DEVI 1 each by Does not apply route once a week.   Prenatal 28-0.8 MG TABS Take 1 tablet by mouth daily.     Allergies:   Hydrocodone and Zithromax [azithromycin]   Social History   Socioeconomic History   Marital status: Single    Spouse name: Not on file   Number of children: Not on file   Years of education: Not on file   Highest education level: Not on file  Occupational History   Occupation: [employer]     Comment: Workers Compensation  Tobacco Use   Smoking status: Former    Packs/day: 0.10  Years: 5.00    Additional pack years: 0.00    Total pack years: 0.50    Types: Cigarettes    Quit date: 11/20/2014    Years since quitting: 8.3   Smokeless tobacco: Never  Vaping Use   Vaping Use: Never used  Substance and Sexual Activity   Alcohol use: Yes    Comment: Occas   Drug use: No   Sexual activity: Yes    Birth control/protection: None    Comment: First IC <16 y/o, Hx of CT+  Other Topics Concern   Not on file  Social History Narrative   Not on file   Social Determinants of Health   Financial Resource Strain: Not on file  Food Insecurity: No Food Insecurity (02/23/2023)   Hunger Vital Sign    Worried About Running Out of Food in the Last Year: Never true    Ran Out of Food in the Last Year: Never true  Transportation Needs: No Transportation Needs (02/23/2023)   PRAPARE - Administrator, Civil Service (Medical):  No    Lack of Transportation (Non-Medical): No  Physical Activity: Not on file  Stress: Not on file  Social Connections: Not on file      Family History  Problem Relation Age of Onset   Diabetes Mother    Arthritis Father    Cancer Brother        Found in jaw area   Diabetes Maternal Aunt    Diabetes Maternal Grandmother       ROS:   Please see the history of present illness.     All other systems reviewed and are negative.   Labs/EKG Reviewed:    EKG:   EKG is was not ordered today.   Recent Labs: 02/23/2023: ALT 18; BUN 6; Creatinine, Ser 0.54; Hemoglobin 12.6; Platelets CANCELED; Potassium 4.1; Sodium 137   Recent Lipid Panel Lab Results  Component Value Date/Time   TRIG 120 09/19/2017 05:16 AM    Physical Exam:    VS:  BP 112/62   Pulse 97   Ht 5\' 3"  (1.6 m)   Wt 183 lb 1.6 oz (83.1 kg)   LMP 12/05/2022 (Approximate)   SpO2 99%   BMI 32.43 kg/m     Wt Readings from Last 3 Encounters:  03/31/23 183 lb 1.6 oz (83.1 kg)  03/23/23 182 lb 12.8 oz (82.9 kg)  03/16/23 180 lb (81.6 kg)     GEN:  Well nourished, well developed in no acute distress HEENT: Normal NECK: No JVD; No carotid bruits LYMPHATICS: No lymphadenopathy CARDIAC: RRR, no murmurs, rubs, gallops RESPIRATORY:  Clear to auscultation without rales, wheezing or rhonchi  ABDOMEN: Soft, non-tender, non-distended MUSCULOSKELETAL:  No edema; No deformity  SKIN: Warm and dry NEUROLOGIC:  Alert and oriented x 3 PSYCHIATRIC:  Normal affect    Risk Assessment/Risk Calculators:     CARPREG II Risk Prediction Index Score:  1.  The patient's risk for a primary cardiac event is 5%.            ASSESSMENT & PLAN:    Hx of hypertension Pulmonary fibrosis  Obesity in pregnancy  Blood pressure is at target this time. She needs an echo to assess the right heart given her hx of pulmonary fibrosis.  She has been started on Aspirin for preeclampsia prophylaxis.    Patient Instructions   Medication Instructions:  Your physician recommends that you continue on your current medications as directed. Please refer to the Current Medication  list given to you today.  *If you need a refill on your cardiac medications before your next appointment, please call your pharmacy*   Lab Work: None   Testing/Procedures: Your physician has requested that you have an echocardiogram. Echocardiography is a painless test that uses sound waves to create images of your heart. It provides your doctor with information about the size and shape of your heart and how well your heart's chambers and valves are working. This procedure takes approximately one hour. There are no restrictions for this procedure. Please do NOT wear cologne, perfume, aftershave, or lotions (deodorant is allowed). Please arrive 15 minutes prior to your appointment time.    Follow-Up: At [facility], you and your health needs are our priority.  As part of our continuing mission to provide you with exceptional heart care, we have created designated Provider Care Teams.  These Care Teams include your primary Cardiologist (physician) and Advanced Practice Providers (APPs -  Physician Assistants and Nurse Practitioners) who all work together to provide you with the care you need, when you need it.  Your next appointment:   12 week(s)  Provider:   Thomasene Ripple, DO [address], [place], [place] [address]     Dispo:  No follow-ups on file.   Medication Adjustments/Labs and Tests Ordered: Current medicines are reviewed at length with the patient today.  Concerns regarding medicines are outlined above.  Tests Ordered: Orders Placed This Encounter  Procedures   ECHOCARDIOGRAM COMPLETE   Medication Changes: No orders of the defined types were placed in this encounter.

## 2023-04-10 ENCOUNTER — Encounter: Payer: Medicare HMO | Admitting: Obstetrics & Gynecology

## 2023-04-11 ENCOUNTER — Ambulatory Visit (INDEPENDENT_AMBULATORY_CARE_PROVIDER_SITE_OTHER): Payer: Medicare HMO | Admitting: Family Medicine

## 2023-04-11 ENCOUNTER — Encounter: Payer: Self-pay | Admitting: Family Medicine

## 2023-04-11 VITALS — BP 122/83 | HR 86 | Wt 184.6 lb

## 2023-04-11 DIAGNOSIS — Z8759 Personal history of other complications of pregnancy, childbirth and the puerperium: Secondary | ICD-10-CM

## 2023-04-11 DIAGNOSIS — D563 Thalassemia minor: Secondary | ICD-10-CM

## 2023-04-11 DIAGNOSIS — J849 Interstitial pulmonary disease, unspecified: Secondary | ICD-10-CM

## 2023-04-11 DIAGNOSIS — I051 Rheumatic mitral insufficiency: Secondary | ICD-10-CM

## 2023-04-11 DIAGNOSIS — O0990 Supervision of high risk pregnancy, unspecified, unspecified trimester: Secondary | ICD-10-CM

## 2023-04-11 DIAGNOSIS — O99612 Diseases of the digestive system complicating pregnancy, second trimester: Secondary | ICD-10-CM

## 2023-04-11 DIAGNOSIS — I10 Essential (primary) hypertension: Secondary | ICD-10-CM

## 2023-04-11 DIAGNOSIS — O99891 Other specified diseases and conditions complicating pregnancy: Secondary | ICD-10-CM

## 2023-04-11 DIAGNOSIS — M329 Systemic lupus erythematosus, unspecified: Secondary | ICD-10-CM

## 2023-04-11 DIAGNOSIS — O0992 Supervision of high risk pregnancy, unspecified, second trimester: Secondary | ICD-10-CM

## 2023-04-11 DIAGNOSIS — K219 Gastro-esophageal reflux disease without esophagitis: Secondary | ICD-10-CM

## 2023-04-11 DIAGNOSIS — Z3A18 18 weeks gestation of pregnancy: Secondary | ICD-10-CM

## 2023-04-11 MED ORDER — PANTOPRAZOLE SODIUM 40 MG PO TBEC
40.0000 mg | DELAYED_RELEASE_TABLET | Freq: Two times a day (BID) | ORAL | 5 refills | Status: AC
Start: 2023-04-11 — End: ?

## 2023-04-11 NOTE — Patient Instructions (Signed)

## 2023-04-11 NOTE — Progress Notes (Signed)
   Subjective:  Kayla [name] is a 32 y.o. Z6X0960 at [redacted]w[redacted]d being seen today for ongoing prenatal care.  She is currently monitored for the following issues for this high-risk pregnancy and has Systemic lupus erythematosus (SLE) affecting pregnancy, antepartum (HCC); Lupus (systemic lupus erythematosus) (HCC); Loud P2 (pulmonary S2, second heart sound); Insomnia; Other secondary pulmonary hypertension (HCC); Chronic hypertension; Sjogren's syndrome (HCC); SS-A antibody positive; SS-B antibody positive; History of IUFD; ILD (interstitial lung disease) (HCC); Gastroesophageal reflux disease without esophagitis; Supervision of high risk pregnancy, antepartum; Mitral valve regurgitation; Seasonal allergies; Viral illness; and Alpha thalassemia silent carrier on their problem list.  Patient reports no complaints.  Contractions: Not present. Vag. Bleeding: None.  Movement: Present. Denies leaking of fluid.   The following portions of the patient's history were reviewed and updated as appropriate: allergies, current medications, past family history, past medical history, past social history, past surgical history and problem list. Problem list updated.  Objective:   Vitals:   04/11/23 1112  BP: 122/83  Pulse: 86  Weight: 184 lb 9.6 oz (83.7 kg)    Fetal Status: Fetal Heart Rate (bpm): 150   Movement: Present     General:  Alert, oriented and cooperative. Patient is in no acute distress.  Skin: Skin is warm and dry. No rash noted.   Cardiovascular: Normal heart rate noted  Respiratory: Normal respiratory effort, no problems with respiration noted  Abdomen: Soft, gravid, appropriate for gestational age. Pain/Pressure: Absent     Pelvic: Vag. Bleeding: None     Cervical exam deferred        Extremities: Normal range of motion.     Mental Status: Normal mood and affect. Normal behavior. Normal judgment and thought content.   Urinalysis:      Assessment and Plan:  Pregnancy: [name] at  [redacted]w[redacted]d  1. Supervision of high risk pregnancy, antepartum BP and FHR normal Refill sent for PPI  2. Chronic hypertension Normotensive, no meds, on ASA  3. ILD (interstitial lung disease) (HCC) Following w [facility], plan for PFT's after delivery  4. Rheumatic mitral regurgitation Saw Cardio OB clinic on 03/31/2023 Scheduled for TTE on 05/02/2023  5. Systemic lupus erythematosus (SLE) affecting pregnancy, antepartum (HCC) Seen by [facility] on 04/10/2023 Resumed on azathioprine, cont hydroxychloroquine and prednisone Plan for repeat PFT's after delivery Fetal echo not yet scheduled, we will coordinate, she is ok with any date/time  6. History of IUFD Antenatal testing per MFM algorithm  7. Alpha thalassemia silent carrier Discussed partner testing, she will discuss with her partner and let us know at the next visit  Preterm labor symptoms and general obstetric precautions including but not limited to vaginal bleeding, contractions, leaking of fluid and fetal movement were reviewed in detail with the patient. Please refer to After Visit Summary for other counseling recommendations.  Return in 4 weeks (on 05/09/2023) for [facility], ob visit, needs MD.   Venora Maples, MD

## 2023-04-14 DIAGNOSIS — O99210 Obesity complicating pregnancy, unspecified trimester: Secondary | ICD-10-CM | POA: Insufficient documentation

## 2023-04-19 ENCOUNTER — Ambulatory Visit: Payer: Medicare HMO | Admitting: *Deleted

## 2023-04-19 ENCOUNTER — Other Ambulatory Visit: Payer: Self-pay | Admitting: *Deleted

## 2023-04-19 ENCOUNTER — Ambulatory Visit: Payer: Medicare HMO | Attending: Obstetrics & Gynecology

## 2023-04-19 ENCOUNTER — Encounter: Payer: Self-pay | Admitting: *Deleted

## 2023-04-19 VITALS — BP 106/60 | HR 99

## 2023-04-19 DIAGNOSIS — Z8759 Personal history of other complications of pregnancy, childbirth and the puerperium: Secondary | ICD-10-CM

## 2023-04-19 DIAGNOSIS — O0990 Supervision of high risk pregnancy, unspecified, unspecified trimester: Secondary | ICD-10-CM

## 2023-04-19 DIAGNOSIS — O10012 Pre-existing essential hypertension complicating pregnancy, second trimester: Secondary | ICD-10-CM

## 2023-04-19 DIAGNOSIS — O99212 Obesity complicating pregnancy, second trimester: Secondary | ICD-10-CM

## 2023-04-19 DIAGNOSIS — O09292 Supervision of pregnancy with other poor reproductive or obstetric history, second trimester: Secondary | ICD-10-CM | POA: Diagnosis not present

## 2023-04-19 DIAGNOSIS — O10912 Unspecified pre-existing hypertension complicating pregnancy, second trimester: Secondary | ICD-10-CM

## 2023-04-19 DIAGNOSIS — Z362 Encounter for other antenatal screening follow-up: Secondary | ICD-10-CM

## 2023-04-19 DIAGNOSIS — M329 Systemic lupus erythematosus, unspecified: Secondary | ICD-10-CM | POA: Diagnosis not present

## 2023-04-19 DIAGNOSIS — E669 Obesity, unspecified: Secondary | ICD-10-CM

## 2023-04-19 DIAGNOSIS — O99892 Other specified diseases and conditions complicating childbirth: Secondary | ICD-10-CM

## 2023-04-19 DIAGNOSIS — Z3A19 19 weeks gestation of pregnancy: Secondary | ICD-10-CM

## 2023-04-19 DIAGNOSIS — O99891 Other specified diseases and conditions complicating pregnancy: Secondary | ICD-10-CM

## 2023-05-02 ENCOUNTER — Ambulatory Visit (HOSPITAL_COMMUNITY): Payer: Medicare HMO | Attending: Cardiology

## 2023-05-02 DIAGNOSIS — J8410 Pulmonary fibrosis, unspecified: Secondary | ICD-10-CM | POA: Diagnosis present

## 2023-05-02 LAB — ECHOCARDIOGRAM COMPLETE
Area-P 1/2: 4.06 cm2
S' Lateral: 2.8 cm

## 2023-05-03 ENCOUNTER — Inpatient Hospital Stay (HOSPITAL_COMMUNITY)
Admission: AD | Admit: 2023-05-03 | Discharge: 2023-05-03 | Disposition: A | Discharge status: Home / Self Care | Payer: Medicare HMO | Attending: Obstetrics and Gynecology | Admitting: Obstetrics and Gynecology

## 2023-05-03 ENCOUNTER — Encounter (HOSPITAL_COMMUNITY): Payer: Self-pay | Admitting: Obstetrics and Gynecology

## 2023-05-03 ENCOUNTER — Inpatient Hospital Stay (HOSPITAL_COMMUNITY): Payer: Medicare HMO

## 2023-05-03 DIAGNOSIS — M3213 Lung involvement in systemic lupus erythematosus: Secondary | ICD-10-CM

## 2023-05-03 DIAGNOSIS — Z1152 Encounter for screening for COVID-19: Secondary | ICD-10-CM | POA: Insufficient documentation

## 2023-05-03 DIAGNOSIS — O10912 Unspecified pre-existing hypertension complicating pregnancy, second trimester: Secondary | ICD-10-CM | POA: Insufficient documentation

## 2023-05-03 DIAGNOSIS — Z79899 Other long term (current) drug therapy: Secondary | ICD-10-CM | POA: Diagnosis not present

## 2023-05-03 DIAGNOSIS — R000 Tachycardia, unspecified: Secondary | ICD-10-CM | POA: Diagnosis not present

## 2023-05-03 DIAGNOSIS — R519 Headache, unspecified: Secondary | ICD-10-CM | POA: Diagnosis not present

## 2023-05-03 DIAGNOSIS — R0602 Shortness of breath: Secondary | ICD-10-CM | POA: Insufficient documentation

## 2023-05-03 DIAGNOSIS — J432 Centrilobular emphysema: Secondary | ICD-10-CM | POA: Insufficient documentation

## 2023-05-03 DIAGNOSIS — O26892 Other specified pregnancy related conditions, second trimester: Secondary | ICD-10-CM | POA: Diagnosis not present

## 2023-05-03 DIAGNOSIS — Z7952 Long term (current) use of systemic steroids: Secondary | ICD-10-CM | POA: Diagnosis not present

## 2023-05-03 DIAGNOSIS — R509 Fever, unspecified: Secondary | ICD-10-CM | POA: Diagnosis not present

## 2023-05-03 DIAGNOSIS — O99891 Other specified diseases and conditions complicating pregnancy: Secondary | ICD-10-CM | POA: Diagnosis not present

## 2023-05-03 DIAGNOSIS — O99512 Diseases of the respiratory system complicating pregnancy, second trimester: Secondary | ICD-10-CM | POA: Insufficient documentation

## 2023-05-03 DIAGNOSIS — M5489 Other dorsalgia: Secondary | ICD-10-CM | POA: Insufficient documentation

## 2023-05-03 DIAGNOSIS — K449 Diaphragmatic hernia without obstruction or gangrene: Secondary | ICD-10-CM | POA: Diagnosis not present

## 2023-05-03 DIAGNOSIS — R059 Cough, unspecified: Secondary | ICD-10-CM | POA: Insufficient documentation

## 2023-05-03 DIAGNOSIS — Z87891 Personal history of nicotine dependence: Secondary | ICD-10-CM | POA: Diagnosis not present

## 2023-05-03 DIAGNOSIS — O0990 Supervision of high risk pregnancy, unspecified, unspecified trimester: Secondary | ICD-10-CM

## 2023-05-03 DIAGNOSIS — L930 Discoid lupus erythematosus: Secondary | ICD-10-CM | POA: Diagnosis not present

## 2023-05-03 DIAGNOSIS — R0682 Tachypnea, not elsewhere classified: Secondary | ICD-10-CM | POA: Insufficient documentation

## 2023-05-03 DIAGNOSIS — J439 Emphysema, unspecified: Secondary | ICD-10-CM | POA: Diagnosis not present

## 2023-05-03 DIAGNOSIS — J849 Interstitial pulmonary disease, unspecified: Secondary | ICD-10-CM

## 2023-05-03 DIAGNOSIS — Z79624 Long term (current) use of inhibitors of nucleotide synthesis: Secondary | ICD-10-CM | POA: Insufficient documentation

## 2023-05-03 DIAGNOSIS — Z3A21 21 weeks gestation of pregnancy: Secondary | ICD-10-CM | POA: Insufficient documentation

## 2023-05-03 DIAGNOSIS — I10 Essential (primary) hypertension: Secondary | ICD-10-CM

## 2023-05-03 DIAGNOSIS — B349 Viral infection, unspecified: Secondary | ICD-10-CM

## 2023-05-03 LAB — CBC WITH DIFFERENTIAL/PLATELET
Abs Immature Granulocytes: 0.18 10*3/uL — ABNORMAL HIGH (ref 0.00–0.07)
Basophils Absolute: 0.1 10*3/uL (ref 0.0–0.1)
Basophils Relative: 1 %
Eosinophils Absolute: 0 10*3/uL (ref 0.0–0.5)
Eosinophils Relative: 0 %
HCT: 38.1 % (ref 36.0–46.0)
Hemoglobin: 12.2 g/dL (ref 12.0–15.0)
Immature Granulocytes: 2 %
Lymphocytes Relative: 9 %
Lymphs Abs: 0.7 10*3/uL (ref 0.7–4.0)
MCH: 27.1 pg (ref 26.0–34.0)
MCHC: 32 g/dL (ref 30.0–36.0)
MCV: 84.7 fL (ref 80.0–100.0)
Monocytes Absolute: 1 10*3/uL (ref 0.1–1.0)
Monocytes Relative: 13 %
Neutro Abs: 6.1 10*3/uL (ref 1.7–7.7)
Neutrophils Relative %: 75 %
Platelets: UNDETERMINED 10*3/uL (ref 150–400)
RBC: 4.5 MIL/uL (ref 3.87–5.11)
RDW: 13.3 % (ref 11.5–15.5)
WBC: 8 10*3/uL (ref 4.0–10.5)
nRBC: 0 % (ref 0.0–0.2)

## 2023-05-03 LAB — URINALYSIS, ROUTINE W REFLEX MICROSCOPIC
Bilirubin Urine: NEGATIVE
Glucose, UA: NEGATIVE mg/dL
Hgb urine dipstick: NEGATIVE
Ketones, ur: NEGATIVE mg/dL
Leukocytes,Ua: NEGATIVE
Nitrite: NEGATIVE
Protein, ur: NEGATIVE mg/dL
Specific Gravity, Urine: 1.024 (ref 1.005–1.030)
pH: 5 (ref 5.0–8.0)

## 2023-05-03 LAB — RESPIRATORY PANEL BY PCR

## 2023-05-03 LAB — COMPREHENSIVE METABOLIC PANEL
ALT: 17 U/L (ref 0–44)
AST: 21 U/L (ref 15–41)
Albumin: 3.3 g/dL — ABNORMAL LOW (ref 3.5–5.0)
Alkaline Phosphatase: 49 U/L (ref 38–126)
Anion gap: 9 (ref 5–15)
BUN: 5 mg/dL — ABNORMAL LOW (ref 6–20)
CO2: 22 mmol/L (ref 22–32)
Calcium: 9.1 mg/dL (ref 8.9–10.3)
Chloride: 104 mmol/L (ref 98–111)
Creatinine, Ser: 0.55 mg/dL (ref 0.44–1.00)
GFR, Estimated: >60 mL/min (ref >60)
Glucose, Bld: 98 mg/dL (ref 70–99)
Potassium: 3.5 mmol/L (ref 3.5–5.1)
Sodium: 135 mmol/L (ref 135–145)
Total Bilirubin: 0.3 mg/dL (ref 0.3–1.2)
Total Protein: 6.9 g/dL (ref 6.5–8.1)

## 2023-05-03 LAB — TROPONIN I (HIGH SENSITIVITY)
Troponin I (High Sensitivity): 3 ng/L (ref <18)
Troponin I (High Sensitivity): <2 ng/L (ref <18)

## 2023-05-03 LAB — RESP PANEL BY RT-PCR (RSV, FLU A&B, COVID)  RVPGX2
Influenza A by PCR: NEGATIVE
Influenza B by PCR: NEGATIVE
Resp Syncytial Virus by PCR: NEGATIVE
SARS Coronavirus 2 by RT PCR: NEGATIVE

## 2023-05-03 LAB — D-DIMER, QUANTITATIVE: D-Dimer, Quant: 1.8 ug/mL-FEU — ABNORMAL HIGH (ref 0.00–0.50)

## 2023-05-03 LAB — GROUP A STREP BY PCR: Group A Strep by PCR: NOT DETECTED

## 2023-05-03 LAB — C-REACTIVE PROTEIN: CRP: 0.9 mg/dL (ref <1.0)

## 2023-05-03 LAB — LACTIC ACID, PLASMA: Lactic Acid, Venous: 1.7 mmol/L (ref 0.5–1.9)

## 2023-05-03 LAB — SEDIMENTATION RATE: Sed Rate: 14 mm/h (ref 0–22)

## 2023-05-03 LAB — PROCALCITONIN: Procalcitonin: <0.1 ng/mL

## 2023-05-03 LAB — BRAIN NATRIURETIC PEPTIDE: B Natriuretic Peptide: 19.7 pg/mL (ref 0.0–100.0)

## 2023-05-03 MED ORDER — LACTATED RINGERS IV SOLN: INTRAVENOUS | Status: DC

## 2023-05-03 MED ORDER — IOHEXOL 350 MG/ML SOLN
75.0000 mL | Freq: Once | INTRAVENOUS | Status: AC | PRN
  Administered 2023-05-03: 75 mL via INTRAVENOUS

## 2023-05-03 MED ORDER — IPRATROPIUM-ALBUTEROL 0.5-2.5 (3) MG/3ML IN SOLN
3.0000 mL | Freq: Once | RESPIRATORY_TRACT | Status: AC
  Administered 2023-05-03: 3 mL via RESPIRATORY_TRACT
  Filled 2023-05-03: qty 3

## 2023-05-03 MED ORDER — CYCLOBENZAPRINE HCL 5 MG PO TABS
10.0000 mg | ORAL_TABLET | Freq: Once | ORAL | Status: AC
  Administered 2023-05-03: 10 mg via ORAL
  Filled 2023-05-03: qty 2

## 2023-05-03 MED ORDER — ACETAMINOPHEN 500 MG PO TABS
1000.0000 mg | ORAL_TABLET | Freq: Once | ORAL | Status: AC
  Administered 2023-05-03: 1000 mg via ORAL
  Filled 2023-05-03: qty 2

## 2023-05-03 MED ORDER — MAGNESIUM 400 MG PO TABS: 1.0000 | ORAL_TABLET | Freq: Every day | ORAL | 3 refills | Status: DC

## 2023-05-03 MED ORDER — LEVOFLOXACIN IN D5W 750 MG/150ML IV SOLN: 750.0000 mg | INTRAVENOUS | Status: DC

## 2023-05-03 MED ORDER — ACETAMINOPHEN-CAFFEINE 500-65 MG PO TABS: 2.0000 | ORAL_TABLET | Freq: Four times a day (QID) | ORAL | 0 refills | Status: DC | PRN

## 2023-05-03 MED ORDER — CYCLOBENZAPRINE HCL 10 MG PO TABS: 10.0000 mg | ORAL_TABLET | Freq: Two times a day (BID) | ORAL | 0 refills | Status: DC | PRN

## 2023-05-03 MED ORDER — CAFFEINE 200 MG PO TABS
200.0000 mg | ORAL_TABLET | Freq: Once | ORAL | Status: AC
  Administered 2023-05-03: 200 mg via ORAL
  Filled 2023-05-03: qty 1

## 2023-05-03 MED ORDER — LACTATED RINGERS IV BOLUS
1000.0000 mL | Freq: Once | INTRAVENOUS | Status: AC
  Administered 2023-05-03: 1000 mL via INTRAVENOUS

## 2023-05-03 NOTE — MAU Note (Signed)
This RN spoke with [facility] as they are not running her Resp Panel swab. Lab tech stated they did not have the sample. This RN to go to lab to look for sample.

## 2023-05-03 NOTE — MAU Note (Signed)
Pt is currently in CT.

## 2023-05-03 NOTE — MAU Note (Signed)
RN called lab to inquire about Urinalysis. Lab tech, [place], answered and informed RN that that they have received sample and will process now. and that they will try to locate the sample and contact the RN if they are unable to locate.Marland Kitchen

## 2023-05-03 NOTE — MAU Note (Signed)
This RN found Resp Panel sample in bin in Main Lab. This RN hand delivered swab to [facility].

## 2023-05-03 NOTE — MAU Note (Addendum)
.  Kayla [name] is a 32 y.o. at [redacted]w[redacted]d here in MAU reporting: lower mid back pain and foul smelling urine x2 days, she also reports generalized body aches and a migraine that started today. Patient currently reporting shortness of breath and right ear pain. Denies VB or LOF. Last had 1,000 mg of Tylenol at 1200.  Pain score: back 10/10, body aches 10/10, migraine 10/10, ear 10/10 Vitals:   05/03/23 1649  BP: 124/80  Pulse: (!) 117  Resp: (!) 24  Temp: 100.1 F (37.8 C)     FHT:165 Lab orders placed from triage:  EKG, UA

## 2023-05-03 NOTE — MAU Provider Note (Signed)
History     [id number]  Arrival date and time: 05/03/23 1635    Chief Complaint  Patient presents with   Back Pain   Generalized Body Aches   Shortness of Breath   Headache     HPI Kayla [name] is a 32 y.o. at [redacted]w[redacted]d with PMHx notable for lupus c/b interstitial lung disease, chronic hypertension, who presents for fever, shortness of breath.   Patient well known to me from clinic Care Everywhere chart reviewed to ascertain baseline lung exam findings, appears she has clear lung fields bilaterally at baseline  Today reports she began to feel unwell yesterday She has not had any sick contacts that she knows of but over the weekend she went to two graduations and multiple graduation parties so has been exposed to many different people Awoke around midnight today and felt unwell, tired, achy all over Endorses a mild cough and shortness of breath that has gotten worse as well R ear is also hurting her Has some nasal drainage Denies chest pain or abdominal pain Does endorse some R sided back pain No burning or pain with urination, no vaginal discharge or bleeding, no leaking fluid Has been taking rheumatic meds as prescribed including azathioprine 50 mg daily, plaquenil 200 mg daily, and prednisone 5 mg daily   O/Positive/-- (04/04 1156)  OB History     Gravida  5   Para  2   Term  1   Preterm  1   AB  2   Living  1      SAB  2   IAB  0   Ectopic  0   Multiple  0   Live Births  1           Past Medical History:  Diagnosis Date   Acute respiratory failure with hypoxemia (HCC) 09/19/2017   Acute respiratory failure with hypoxia (HCC)    Arthritis    "hands and legs" (01/08/2015)   CAP (community acquired pneumonia) 01/07/2015   Chronic Respiratory failure with oxygen requirement affecting pregnancy, antepartum 05/16/2016   Resolved pulmonary HTN on 2018 echo (in our epic).   Pt off O2 in 2018.   Daily headache    "sometimes" (01/08/2015)   GERD  (gastroesophageal reflux disease)    Hypertension    No longer takes meds   Lung disease    Lupus (HCC)    Multifocal pneumonia    Pulmonary hypertension (HCC)    Sjogren's syndrome (HCC)    STD (sexually transmitted disease)    Chlamydia    Past Surgical History:  Procedure Laterality Date   CARDIAC CATHETERIZATION N/A 09/09/2015   Procedure: Right Heart Cath;  Surgeon: Laurey Morale, MD;  Location: [facility] INVASIVE CV LAB;  Service: Cardiovascular;  Laterality: N/A;   DILATION AND EVACUATION N/A 07/13/2015   Procedure: DILATATION AND EVACUATION;  Surgeon: Levie Heritage, DO;  Location: [facility] ORS;  Service: Gynecology;  Laterality: N/A;   FINGER SURGERY Right 03/2014   "laceration, nerve/artery injury" 2nd digit   VIDEO BRONCHOSCOPY Bilateral 01/12/2015   Procedure: VIDEO BRONCHOSCOPY WITH FLUORO;  Surgeon: Leslye Peer, MD;  Location[facility] ENDOSCOPY;  Service: Cardiopulmonary;  Laterality: Bilateral;    Family History  Problem Relation Age of Onset   Diabetes Mother    Arthritis Father    Cancer Brother        Found in jaw area   Diabetes Maternal Aunt    Diabetes Maternal Grandmother     Social History  Socioeconomic History   Marital status: Single    Spouse name: Not on file   Number of children: Not on file   Years of education: Not on file   Highest education level: Not on file  Occupational History   Occupation: [employer]     Comment: Workers Compensation  Tobacco Use   Smoking status: Former    Packs/day: 0.10    Years: 5.00    Additional pack years: 0.00    Total pack years: 0.50    Types: Cigarettes    Quit date: 11/20/2014    Years since quitting: 8.4   Smokeless tobacco: Never  Vaping Use   Vaping Use: Never used  Substance and Sexual Activity   Alcohol use: Yes    Comment: Occas   Drug use: No   Sexual activity: Yes    Birth control/protection: None    Comment: First IC <15 y/o, Hx of CT+  Other Topics Concern   Not on file  Social History  Narrative   Not on file   Social Determinants of Health   Financial Resource Strain: Not on file  Food Insecurity: No Food Insecurity (02/23/2023)   Hunger Vital Sign    Worried About Running Out of Food in the Last Year: Never true    Ran Out of Food in the Last Year: Never true  Transportation Needs: No Transportation Needs (02/23/2023)   PRAPARE - Administrator, Civil Service (Medical): No    Lack of Transportation (Non-Medical): No  Physical Activity: Not on file  Stress: Not on file  Social Connections: Not on file  Intimate Partner Violence: Not on file    Allergies  Allergen Reactions   Hydrocodone Nausea And Vomiting   Zithromax [Azithromycin] Itching and Cough    No current facility-administered medications on file prior to encounter.   Current Outpatient Medications on File Prior to Encounter  Medication Sig Dispense Refill   acetaminophen (TYLENOL) 500 MG tablet Take 1,000 mg by mouth every 6 (six) hours as needed.     aspirin EC 81 MG tablet Take 1 tablet (81 mg total) by mouth daily. Swallow whole. 30 tablet 12   azaTHIOprine (IMURAN) 50 MG tablet Take 50 mg by mouth daily.     Prenatal 28-0.8 MG TABS Take 1 tablet by mouth daily. 30 tablet 12   [DISCONTINUED] amLODipine (NORVASC) 5 MG tablet Take 1 tablet (5 mg total) by mouth daily.       ROS Pertinent positives and negative per HPI, all others reviewed and negative  Physical Exam   BP 123/66   Pulse 98   Temp 98.9 F (37.2 C) (Oral)   Resp (!) 22   LMP 12/05/2022 (Approximate)   SpO2 95% Comment: 98% post treatment  Patient Vitals for the past 24 hrs:  BP Temp Temp src Pulse Resp SpO2  05/03/23 2110 -- -- -- -- -- 95 %  05/03/23 2100 123/66 -- -- 98 -- 97 %  05/03/23 2050 (!) 115/58 -- -- (!) 101 -- 96 %  05/03/23 2010 -- -- -- -- -- 95 %  05/03/23 2005 -- -- -- -- -- 96 %  05/03/23 2000 113/67 -- -- (!) 111 -- 96 %  05/03/23 1955 -- -- -- -- -- 95 %  05/03/23 1950 -- -- -- -- -- 95  %  05/03/23 1945 (!) 105/58 -- -- (!) 109 -- 96 %  05/03/23 1940 -- -- -- -- -- 96 %  05/03/23 1935 109/61 -- -- Marland Kitchen  105 -- --  05/03/23 1925 -- -- -- -- -- 95 %  05/03/23 1920 (!) 90/51 98.9 F (37.2 C) Oral (!) 103 -- 95 %  05/03/23 1915 -- -- -- -- -- 97 %  05/03/23 1910 -- -- -- -- -- 98 %  05/03/23 1905 -- -- -- -- -- 98 %  05/03/23 1900 -- -- -- -- -- 98 %  05/03/23 1855 -- -- -- -- -- 100 %  05/03/23 1850 -- -- -- -- -- 99 %  05/03/23 1840 -- -- -- -- -- 100 %  05/03/23 1835 -- -- -- -- -- 100 %  05/03/23 1830 (!) 114/58 -- -- (!) 113 -- 100 %  05/03/23 1825 -- -- -- -- -- 100 %  05/03/23 1820 -- -- -- -- -- 100 %  05/03/23 1815 126/86 -- -- (!) 117 -- 100 %  05/03/23 1810 -- -- -- -- -- 100 %  05/03/23 1808 118/64 (!) 100.8 F (38.2 C) Oral (!) 105 (!) 22 100 %  05/03/23 1800 -- -- -- -- -- 98 %  05/03/23 1755 -- -- -- -- -- 98 %  05/03/23 1750 -- -- -- -- -- 97 %  05/03/23 1725 -- -- -- -- -- 99 %  05/03/23 1720 -- -- -- -- -- 99 %  05/03/23 1715 -- -- -- -- -- 98 %  05/03/23 1710 -- -- -- -- -- 99 %  05/03/23 1700 130/65 -- -- (!) 122 -- 95 %  05/03/23 1655 -- (!) 100.8 F (38.2 C) -- -- -- --  05/03/23 1650 -- -- -- -- -- 100 %  05/03/23 1649 124/80 100.1 F (37.8 C) Oral (!) 117 (!) 24 --    Physical Exam Vitals reviewed.  Constitutional:      General: She is not in acute distress.    Appearance: She is well-developed. She is not toxic-appearing or diaphoretic.  HENT:     Head: Normocephalic and atraumatic.     Right Ear: Tympanic membrane and ear canal normal.     Left Ear: Tympanic membrane and ear canal normal.  Cardiovascular:     Rate and Rhythm: Regular rhythm. Tachycardia present.     Heart sounds: Normal heart sounds.  Pulmonary:     Effort: No tachypnea, accessory muscle usage or respiratory distress.     Breath sounds: No stridor. Examination of the right-middle field reveals rales. Examination of the right-lower field reveals rales.  Examination of the left-lower field reveals rales. Rales present. No decreased breath sounds, wheezing or rhonchi.  Abdominal:     General: Bowel sounds are normal.     Palpations: Abdomen is soft.  Musculoskeletal:     Right lower leg: No edema.     Left lower leg: No edema.  Skin:    General: Skin is warm and dry.  Neurological:     General: No focal deficit present.     Mental Status: She is alert.  Psychiatric:        Mood and Affect: Mood normal.        Behavior: Behavior normal.      Cervical Exam    Bedside Ultrasound Not performed.  My interpretation: n/a  FHT FHR 165 bpm by doppler  Labs Results for orders placed or performed during the hospital encounter of 05/03/23 (from the past 24 hour(s))  Urinalysis, Routine w reflex microscopic -Urine, Clean Catch     Status: Abnormal   Collection Time: 05/03/23  4:52 PM  Result Value Ref Range   Color, Urine YELLOW YELLOW   APPearance HAZY (A) CLEAR   Specific Gravity, Urine 1.024 1.005 - 1.030   pH 5.0 5.0 - 8.0   Glucose, UA NEGATIVE NEGATIVE mg/dL   Hgb urine dipstick NEGATIVE NEGATIVE   Bilirubin Urine NEGATIVE NEGATIVE   Ketones, ur NEGATIVE NEGATIVE mg/dL   Protein, ur NEGATIVE NEGATIVE mg/dL   Nitrite NEGATIVE NEGATIVE   Leukocytes,Ua NEGATIVE NEGATIVE  Resp panel by RT-PCR (RSV, Flu A&B, Covid) Anterior Nasal Swab     Status: None   Collection Time: 05/03/23  5:35 PM   Specimen: Anterior Nasal Swab  Result Value Ref Range   SARS Coronavirus 2 by RT PCR NEGATIVE NEGATIVE   Influenza A by PCR NEGATIVE NEGATIVE   Influenza B by PCR NEGATIVE NEGATIVE   Resp Syncytial Virus by PCR NEGATIVE NEGATIVE  Respiratory (~20 pathogens) panel by PCR     Status: None   Collection Time: 05/03/23  5:37 PM   Specimen: Nasopharyngeal Swab; Respiratory  Result Value Ref Range   Adenovirus NOT DETECTED NOT DETECTED   Coronavirus 229E NOT DETECTED NOT DETECTED   Coronavirus HKU1 NOT DETECTED NOT DETECTED   Coronavirus  NL63 NOT DETECTED NOT DETECTED   Coronavirus OC43 NOT DETECTED NOT DETECTED   Metapneumovirus NOT DETECTED NOT DETECTED   Rhinovirus / Enterovirus NOT DETECTED NOT DETECTED   Influenza A NOT DETECTED NOT DETECTED   Influenza B NOT DETECTED NOT DETECTED   Parainfluenza Virus 1 NOT DETECTED NOT DETECTED   Parainfluenza Virus 2 NOT DETECTED NOT DETECTED   Parainfluenza Virus 3 NOT DETECTED NOT DETECTED   Parainfluenza Virus 4 NOT DETECTED NOT DETECTED   Respiratory Syncytial Virus NOT DETECTED NOT DETECTED   Bordetella pertussis NOT DETECTED NOT DETECTED   Bordetella Parapertussis NOT DETECTED NOT DETECTED   Chlamydophila pneumoniae NOT DETECTED NOT DETECTED   Mycoplasma pneumoniae NOT DETECTED NOT DETECTED  Group A Strep by PCR     Status: None   Collection Time: 05/03/23  5:37 PM   Specimen: Throat; Sterile Swab  Result Value Ref Range   Group A Strep by PCR NOT DETECTED NOT DETECTED  CBC with Differential/Platelet     Status: Abnormal   Collection Time: 05/03/23  5:37 PM  Result Value Ref Range   WBC 8.0 4.0 - 10.5 K/uL   RBC 4.50 3.87 - 5.11 MIL/uL   Hemoglobin 12.2 12.0 - 15.0 g/dL   HCT 78.2 95.6 - 21.3 %   MCV 84.7 80.0 - 100.0 fL   MCH 27.1 26.0 - 34.0 pg   MCHC 32.0 30.0 - 36.0 g/dL   RDW 08.6 57.8 - 46.9 %   Platelets PLATELET CLUMPS NOTED ON SMEAR, UNABLE TO ESTIMATE 150 - 400 K/uL   nRBC 0.0 0.0 - 0.2 %   Neutrophils Relative % 75 %   Neutro Abs 6.1 1.7 - 7.7 K/uL   Lymphocytes Relative 9 %   Lymphs Abs 0.7 0.7 - 4.0 K/uL   Monocytes Relative 13 %   Monocytes Absolute 1.0 0.1 - 1.0 K/uL   Eosinophils Relative 0 %   Eosinophils Absolute 0.0 0.0 - 0.5 K/uL   Basophils Relative 1 %   Basophils Absolute 0.1 0.0 - 0.1 K/uL   Immature Granulocytes 2 %   Abs Immature Granulocytes 0.18 (H) 0.00 - 0.07 K/uL  Comprehensive metabolic panel     Status: Abnormal   Collection Time: 05/03/23  5:37 PM  Result Value Ref Range   Sodium  135 135 - 145 mmol/L   Potassium 3.5 3.5  - 5.1 mmol/L   Chloride 104 98 - 111 mmol/L   CO2 22 22 - 32 mmol/L   Glucose, Bld 98 70 - 99 mg/dL   BUN 5 (L) 6 - 20 mg/dL   Creatinine, Ser 1.61 0.44 - 1.00 mg/dL   Calcium 9.1 8.9 - 09.6 mg/dL   Total Protein 6.9 6.5 - 8.1 g/dL   Albumin 3.3 (L) 3.5 - 5.0 g/dL   AST 21 15 - 41 U/L   ALT 17 0 - 44 U/L   Alkaline Phosphatase 49 38 - 126 U/L   Total Bilirubin 0.3 0.3 - 1.2 mg/dL   GFR, Estimated >04 >54 mL/min   Anion gap 9 5 - 15  Brain natriuretic peptide     Status: None   Collection Time: 05/03/23  5:37 PM  Result Value Ref Range   B Natriuretic Peptide 19.7 0.0 - 100.0 pg/mL  Troponin I (High Sensitivity)     Status: None   Collection Time: 05/03/23  5:37 PM  Result Value Ref Range   Troponin I (High Sensitivity) 3 <18 ng/L  D-dimer, quantitative     Status: Abnormal   Collection Time: 05/03/23  5:37 PM  Result Value Ref Range   D-Dimer, Quant 1.80 (H) 0.00 - 0.50 ug/mL-FEU  Procalcitonin     Status: None   Collection Time: 05/03/23  5:37 PM  Result Value Ref Range   Procalcitonin <0.10 ng/mL  Lactic acid, plasma     Status: None   Collection Time: 05/03/23  5:37 PM  Result Value Ref Range   Lactic Acid, Venous 1.7 0.5 - 1.9 mmol/L  Troponin I (High Sensitivity)     Status: None   Collection Time: 05/03/23  7:22 PM  Result Value Ref Range   Troponin I (High Sensitivity) <2 <18 ng/L    Imaging CT Angio Chest Pulmonary Embolism (PE) W or WO Contrast  Result Date: 05/03/2023 CLINICAL DATA:  Suspected pulmonary embolism. EXAM: CT ANGIOGRAPHY CHEST WITH CONTRAST TECHNIQUE: Multidetector CT imaging of the chest was performed using the standard protocol during bolus administration of intravenous contrast. Multiplanar CT image reconstructions and MIPs were obtained to evaluate the vascular anatomy. RADIATION DOSE REDUCTION: This exam was performed according to the departmental dose-optimization program which includes automated exposure control, adjustment of the mA and/or kV  according to patient size and/or use of iterative reconstruction technique. CONTRAST:  75mL OMNIPAQUE IOHEXOL 350 MG/ML SOLN COMPARISON:  August 15, 2022 FINDINGS: Cardiovascular: Satisfactory opacification of the pulmonary arteries to the segmental level. No evidence of pulmonary embolism. Normal heart size. No pericardial effusion. Mediastinum/Nodes: Mild, stable paratracheal and right hilar lymphadenopathy is seen. Thyroid gland, trachea, and esophagus demonstrate no significant findings. Lungs/Pleura: There is marked severity paraseptal and central lobular emphysematous lung disease. Stable, moderate to marked severity areas of scarring and/or atelectasis are also seen within the bilateral upper lobes and bilateral lower lobes. A stable 5 mm noncalcified lung nodule is seen within the lateral aspect of the left upper lobe. There is no evidence of an acute infiltrate, pleural effusion or pneumothorax. Upper Abdomen: There is a small hiatal hernia. Musculoskeletal: No chest wall abnormality. No acute or significant osseous findings. Review of the MIP images confirms the above findings. IMPRESSION: 1. No evidence of pulmonary embolism. 2. Marked severity paraseptal and central lobular emphysematous lung disease. 3. Stable, moderate to marked severity areas of scarring and/or atelectasis within the bilateral upper lobes and  bilateral lower lobes. 4. Stable 5 mm noncalcified left upper lobe lung nodule. No follow-up needed if patient is low-risk.This recommendation follows the consensus statement: Guidelines for Management of Incidental Pulmonary Nodules Detected on CT Images: From the Fleischner Society 2017; Radiology 2017; 284:228-243. 5. Small hiatal hernia. Emphysema (ICD10-J43.9). Electronically Signed   By: Aram Candela M.D.   On: 05/03/2023 20:41   DG Chest Portable 1 View  Result Date: 05/03/2023 CLINICAL DATA:  Shortness of breath. Known interstitial lung disease EXAM: PORTABLE CHEST 1 VIEW  COMPARISON:  X-ray and CT angiogram 08/14/2022 FINDINGS: Underinflation. Diffuse interstitial changes identified greatest along the lung bases. There is also some bandlike areas in the upper lobes bilaterally, areas of likely scarring. No separate consolidation, pneumothorax or effusion. Stable cardiopericardial silhouette without edema IMPRESSION: Underinflation with chronic interstitial lung changes as seen previously. Stable upper lung areas of scar as well. Electronically Signed   By: Karen Kays M.D.   On: 05/03/2023 17:56    MAU Course  Procedures Lab Orders         Respiratory (~20 pathogens) panel by PCR         Group A Strep by PCR         Culture, OB Urine         Culture, blood (Routine X 2) w Reflex to ID Panel         Resp panel by RT-PCR (RSV, Flu A&B, Covid) Anterior Nasal Swab         Urinalysis, Routine w reflex microscopic -Urine, Clean Catch         CBC with Differential/Platelet         Comprehensive metabolic panel         Brain natriuretic peptide         D-dimer, quantitative         Procalcitonin         Lactic acid, plasma         C-reactive protein         Sedimentation rate    Meds ordered this encounter  Medications   lactated ringers bolus 1,000 mL   lactated ringers infusion   acetaminophen (TYLENOL) tablet 1,000 mg   DISCONTD: levofloxacin (LEVAQUIN) IVPB 750 mg    Order Specific Question:   Antibiotic Indication:    Answer:   CAP   lactated ringers bolus 1,000 mL   cyclobenzaprine (FLEXERIL) tablet 10 mg   caffeine tablet 200 mg   iohexol (OMNIPAQUE) 350 MG/ML injection 75 mL   ipratropium-albuterol (DUONEB) 0.5-2.5 (3) MG/3ML nebulizer solution 3 mL   cyclobenzaprine (FLEXERIL) 10 MG tablet    Sig: Take 1 tablet (10 mg total) by mouth 2 (two) times daily as needed for muscle spasms.    Dispense:  20 tablet    Refill:  0   acetaminophen-caffeine (EXCEDRIN TENSION HEADACHE) 500-65 MG TABS per tablet    Sig: Take 2 tablets by mouth every 6 (six)  hours as needed (headache).    Dispense:  60 tablet    Refill:  0   Magnesium 400 MG TABS    Sig: Take 1 tablet by mouth daily.    Dispense:  90 tablet    Refill:  3   Imaging Orders         DG Chest Portable 1 View         CT Angio Chest Pulmonary Embolism (PE) W or WO Contrast     MDM High (Level 5)  Assessment and  Plan  #Fever #?Viral infection? #Lupus #ILD Patient presenting with fever, tachycardia, and mild tachypnea initially concerning for infection. However after extensive workup no significant source of infection has been found. CBC showed normal white count, procalcitonin was negative making bacterial infection highly unlikely, UA was unremarkable, and patient has no significant symptoms beyond SOB and cough. In addition respiratory viral panel was negative for all infection, COVID swab was negative, and strep A PCR was negative. D dimer was obtained which prompted CTPA which luckily did not show any evidence of PE though perhaps some progression of ILD lung changes. ECG and troponin were negative as well. Patient has been treated with 2L LR and symptomatic meds.   Discussed extensive workup with patient and that at this point remaining differential considerations are viral illness that is not on our viral panel or flare of her rheumatic disease, but no evidence can be found of acute life threatening illness. Offered and accepted duoneb given diffuse emphysematous changes seen on CT, she did not report significant change in her symptoms though. After discussion patient comfortable with discharge to home. Will add on CRP/ESR to evaluate possiblity of worsening rheum disease. I will also send a message in AM to her Rheumatologist at [facility] to get their input on CT findings. I also reviewed return precautions in detail with patient, including worsening shortness of breath, chest pain, inability to take PO, or any routine obstetric indications.   #FWB FHR 165 bpm by doppler   Dispo:  discharged to home in stable condition   Venora Maples, MD/MPH 05/03/23 10:06 PM  Allergies as of 05/03/2023       Reactions   Hydrocodone Nausea And Vomiting   Zithromax [azithromycin] Itching, Cough        Medication List     TAKE these medications    acetaminophen 500 MG tablet Commonly known as: TYLENOL Take 1,000 mg by mouth every 6 (six) hours as needed.   acetaminophen-caffeine 500-65 MG Tabs per tablet Commonly known as: EXCEDRIN TENSION HEADACHE Take 2 tablets by mouth every 6 (six) hours as needed (headache).   aspirin EC 81 MG tablet Take 1 tablet (81 mg total) by mouth daily. Swallow whole.   azaTHIOprine 50 MG tablet Commonly known as: IMURAN Take 50 mg by mouth daily.   cyclobenzaprine 10 MG tablet Commonly known as: FLEXERIL Take 1 tablet (10 mg total) by mouth 2 (two) times daily as needed for muscle spasms.   hydroxychloroquine 200 MG tablet Commonly known as: PLAQUENIL Take 200 mg by mouth 2 (two) times daily.   Magnesium 400 MG Tabs Take 1 tablet by mouth daily.   pantoprazole 40 MG tablet Commonly known as: Protonix Take 1 tablet (40 mg total) by mouth 2 (two) times daily before a meal.   predniSONE 5 MG tablet Commonly known as: DELTASONE Take 5 mg by mouth daily with breakfast.   Prenatal 28-0.8 MG Tabs Take 1 tablet by mouth daily.

## 2023-05-04 ENCOUNTER — Encounter: Payer: Self-pay | Admitting: Family Medicine

## 2023-05-04 DIAGNOSIS — J849 Interstitial pulmonary disease, unspecified: Secondary | ICD-10-CM

## 2023-05-04 LAB — CULTURE, BLOOD (ROUTINE X 2)

## 2023-05-04 LAB — CULTURE, OB URINE

## 2023-05-04 MED ORDER — IPRATROPIUM BROMIDE HFA 17 MCG/ACT IN AERS
2.0000 | INHALATION_SPRAY | Freq: Four times a day (QID) | RESPIRATORY_TRACT | 12 refills | Status: AC | PRN
Start: 2023-05-04 — End: ?

## 2023-05-04 MED ORDER — ALBUTEROL SULFATE HFA 108 (90 BASE) MCG/ACT IN AERS
2.0000 | INHALATION_SPRAY | Freq: Four times a day (QID) | RESPIRATORY_TRACT | 2 refills | Status: AC | PRN
Start: 2023-05-04 — End: ?

## 2023-05-05 LAB — CULTURE, BLOOD (ROUTINE X 2): Culture: NO GROWTH

## 2023-05-08 LAB — CULTURE, BLOOD (ROUTINE X 2)
Culture: NO GROWTH
Special Requests: ADEQUATE
Special Requests: ADEQUATE

## 2023-05-10 ENCOUNTER — Ambulatory Visit (INDEPENDENT_AMBULATORY_CARE_PROVIDER_SITE_OTHER): Payer: Medicare HMO | Admitting: Obstetrics and Gynecology

## 2023-05-10 ENCOUNTER — Other Ambulatory Visit: Payer: Self-pay

## 2023-05-10 VITALS — BP 114/73 | HR 104 | Wt 186.9 lb

## 2023-05-10 DIAGNOSIS — M329 Systemic lupus erythematosus, unspecified: Secondary | ICD-10-CM

## 2023-05-10 DIAGNOSIS — I2729 Other secondary pulmonary hypertension: Secondary | ICD-10-CM

## 2023-05-10 DIAGNOSIS — O0992 Supervision of high risk pregnancy, unspecified, second trimester: Secondary | ICD-10-CM

## 2023-05-10 DIAGNOSIS — D563 Thalassemia minor: Secondary | ICD-10-CM

## 2023-05-10 DIAGNOSIS — Z3A22 22 weeks gestation of pregnancy: Secondary | ICD-10-CM

## 2023-05-10 DIAGNOSIS — Z8759 Personal history of other complications of pregnancy, childbirth and the puerperium: Secondary | ICD-10-CM

## 2023-05-10 DIAGNOSIS — J849 Interstitial pulmonary disease, unspecified: Secondary | ICD-10-CM

## 2023-05-10 DIAGNOSIS — I10 Essential (primary) hypertension: Secondary | ICD-10-CM

## 2023-05-10 DIAGNOSIS — O0990 Supervision of high risk pregnancy, unspecified, unspecified trimester: Secondary | ICD-10-CM

## 2023-05-10 DIAGNOSIS — O99891 Other specified diseases and conditions complicating pregnancy: Secondary | ICD-10-CM

## 2023-05-10 NOTE — Progress Notes (Signed)
   PRENATAL VISIT NOTE  Subjective:  Kayla [name] is a 32 y.o. [id number] at [redacted]w[redacted]d being seen today for ongoing prenatal care.  She is currently monitored for the following issues for this high-risk pregnancy and has Systemic lupus erythematosus (SLE) affecting pregnancy, antepartum (HCC); Lupus (systemic lupus erythematosus) (HCC); Loud P2 (pulmonary S2, second heart sound); Insomnia; Other secondary pulmonary hypertension (HCC); Chronic hypertension; Sjogren's syndrome (HCC); SS-A antibody positive; SS-B antibody positive; History of IUFD; ILD (interstitial lung disease) (HCC); Gastroesophageal reflux disease without esophagitis; Supervision of high risk pregnancy, antepartum, RED CHART patient; Mitral valve regurgitation; Seasonal allergies; Viral illness; Alpha thalassemia silent carrier; and Obesity affecting pregnancy on their problem list.  Patient doing well with no acute concerns today. She reports no complaints.  Contractions: Not present.  .  Movement: Present. Denies leaking of fluid.   The following portions of the patient's history were reviewed and updated as appropriate: allergies, current medications, past family history, past medical history, past social history, past surgical history and problem list. Problem list updated.  Objective:   Vitals:   05/10/23 1101  BP: 114/73  Pulse: (!) 104  Weight: 186 lb 14.4 oz (84.8 kg)    Fetal Status: Fetal Heart Rate (bpm): 155 Fundal Height: 21 cm Movement: Present     General:  Alert, oriented and cooperative. Patient is in no acute distress.  Skin: Skin is warm and dry. No rash noted.   Cardiovascular: Normal heart rate noted  Respiratory: Normal respiratory effort, no problems with respiration noted  Abdomen: Soft, gravid, appropriate for gestational age.  Pain/Pressure: Absent     Pelvic: Cervical exam deferred        Extremities: Normal range of motion.  Edema: None  Mental Status:  Normal mood and affect. Normal behavior.  Normal judgment and thought content.   Assessment and Plan:  Pregnancy: [id number] at [redacted]w[redacted]d  1. [redacted] weeks gestation of pregnancy   2. Other secondary pulmonary hypertension (HCC) Pulmonary status is stable, echo showed ejection fraction of 60-65%, no valve regurgitation  3. Chronic hypertension BP normal without meds currently OB cards appt 8/24  4. ILD (interstitial lung disease) (HCC) Pt following with [facility], PFTs after delivery Per pt they are trying to schedule a follow up appt before she delivers, but scheduling is an issue  5. Systemic lupus erythematosus (SLE) affecting pregnancy, antepartum (HCC) See above, pt continues meds; azothioprine, hydroxychloroquine and prednisone, may need to consider stress dosing for steroids due to daily prednisone use  Fetal echo is pending; however, do not see appt in Epic  6. Supervision of high risk pregnancy, antepartum Continue routine prenatal care  7. History of IUFD Testing per MFM algorithm  8. Alpha thalassemia silent carrier No report if FOB will get testing  Preterm labor symptoms and general obstetric precautions including but not limited to vaginal bleeding, contractions, leaking of fluid and fetal movement were reviewed in detail with the patient.  Please refer to After Visit Summary for other counseling recommendations.   Return in about 4 weeks (around 06/07/2023) for [facility], in person, 2 hr GTT, 3rd trim labs.   Mariel Aloe, MD Faculty Attending Center for [facility]

## 2023-05-24 ENCOUNTER — Ambulatory Visit: Payer: Medicare HMO | Admitting: *Deleted

## 2023-05-24 ENCOUNTER — Ambulatory Visit: Payer: Medicare HMO | Attending: Maternal & Fetal Medicine

## 2023-05-24 ENCOUNTER — Other Ambulatory Visit: Payer: Self-pay | Admitting: *Deleted

## 2023-05-24 VITALS — BP 110/59 | HR 90

## 2023-05-24 DIAGNOSIS — Z8759 Personal history of other complications of pregnancy, childbirth and the puerperium: Secondary | ICD-10-CM | POA: Insufficient documentation

## 2023-05-24 DIAGNOSIS — O10012 Pre-existing essential hypertension complicating pregnancy, second trimester: Secondary | ICD-10-CM

## 2023-05-24 DIAGNOSIS — M329 Systemic lupus erythematosus, unspecified: Secondary | ICD-10-CM

## 2023-05-24 DIAGNOSIS — O0990 Supervision of high risk pregnancy, unspecified, unspecified trimester: Secondary | ICD-10-CM | POA: Diagnosis present

## 2023-05-24 DIAGNOSIS — O3660X0 Maternal care for excessive fetal growth, unspecified trimester, not applicable or unspecified: Secondary | ICD-10-CM | POA: Diagnosis not present

## 2023-05-24 DIAGNOSIS — O99212 Obesity complicating pregnancy, second trimester: Secondary | ICD-10-CM

## 2023-05-24 DIAGNOSIS — Z362 Encounter for other antenatal screening follow-up: Secondary | ICD-10-CM | POA: Diagnosis present

## 2023-05-24 DIAGNOSIS — O10912 Unspecified pre-existing hypertension complicating pregnancy, second trimester: Secondary | ICD-10-CM | POA: Diagnosis present

## 2023-05-24 DIAGNOSIS — D563 Thalassemia minor: Secondary | ICD-10-CM

## 2023-05-24 DIAGNOSIS — Z3A24 24 weeks gestation of pregnancy: Secondary | ICD-10-CM

## 2023-05-24 DIAGNOSIS — O99891 Other specified diseases and conditions complicating pregnancy: Secondary | ICD-10-CM | POA: Insufficient documentation

## 2023-05-24 DIAGNOSIS — O09292 Supervision of pregnancy with other poor reproductive or obstetric history, second trimester: Secondary | ICD-10-CM

## 2023-05-24 DIAGNOSIS — O99012 Anemia complicating pregnancy, second trimester: Secondary | ICD-10-CM

## 2023-06-06 ENCOUNTER — Encounter (HOSPITAL_COMMUNITY): Payer: Self-pay | Admitting: Obstetrics and Gynecology

## 2023-06-06 ENCOUNTER — Other Ambulatory Visit: Payer: Self-pay | Admitting: Lactation Services

## 2023-06-06 ENCOUNTER — Inpatient Hospital Stay (HOSPITAL_COMMUNITY)
Admission: AD | Admit: 2023-06-06 | Discharge: 2023-06-06 | Disposition: A | Discharge status: Home / Self Care | Payer: Medicare HMO | Attending: Obstetrics and Gynecology | Admitting: Obstetrics and Gynecology

## 2023-06-06 ENCOUNTER — Other Ambulatory Visit: Payer: Self-pay

## 2023-06-06 DIAGNOSIS — O0990 Supervision of high risk pregnancy, unspecified, unspecified trimester: Secondary | ICD-10-CM

## 2023-06-06 DIAGNOSIS — Z3A26 26 weeks gestation of pregnancy: Secondary | ICD-10-CM

## 2023-06-06 DIAGNOSIS — Z87891 Personal history of nicotine dependence: Secondary | ICD-10-CM | POA: Diagnosis not present

## 2023-06-06 DIAGNOSIS — N898 Other specified noninflammatory disorders of vagina: Secondary | ICD-10-CM

## 2023-06-06 DIAGNOSIS — O23592 Infection of other part of genital tract in pregnancy, second trimester: Secondary | ICD-10-CM | POA: Insufficient documentation

## 2023-06-06 DIAGNOSIS — O26892 Other specified pregnancy related conditions, second trimester: Secondary | ICD-10-CM | POA: Diagnosis not present

## 2023-06-06 LAB — URINALYSIS, ROUTINE W REFLEX MICROSCOPIC
Bilirubin Urine: NEGATIVE
Glucose, UA: NEGATIVE mg/dL
Hgb urine dipstick: NEGATIVE
Ketones, ur: NEGATIVE mg/dL
Leukocytes,Ua: NEGATIVE
Nitrite: NEGATIVE
Protein, ur: NEGATIVE mg/dL
Specific Gravity, Urine: 1.015 (ref 1.005–1.030)
pH: 6 (ref 5.0–8.0)

## 2023-06-06 LAB — WET PREP, GENITAL
Clue Cells Wet Prep HPF POC: NONE SEEN
Sperm: NONE SEEN
Trich, Wet Prep: NONE SEEN
WBC, Wet Prep HPF POC: >=10 — AB (ref <10)
Yeast Wet Prep HPF POC: NONE SEEN

## 2023-06-06 NOTE — MAU Provider Note (Signed)
History     CSN: [id number]  Arrival date and time: 06/06/23 2130   Event Date/Time   First Provider Initiated Contact with Patient 06/06/23 1032      Chief Complaint  Patient presents with   Cramping   Vaginal Discharge   Ms. Kayla [name] is a 32 y.o. year old [id number] female at [redacted]w[redacted]d weeks gestation who presents to MAU reporting abdominal cramping that she rates 6 out of 10.  She also complains of vaginal discharge. She describes it as whitish-yellow with a slight fishy odor.  She denies vaginal bleeding or loss of fluid.  She reports positive fetal movement.  She denies recent sexual intercourse.  [facility] for next prenatal appointment is June 09, 2023.   OB History     Gravida  5   Para  2   Term  1   Preterm  1   AB  2   Living  1      SAB  2   IAB  0   Ectopic  0   Multiple  0   Live Births  1           Past Medical History:  Diagnosis Date   Acute respiratory failure with hypoxemia (HCC) 09/19/2017   Acute respiratory failure with hypoxia (HCC)    Arthritis    "hands and legs" (01/08/2015)   CAP (community acquired pneumonia) 01/07/2015   Chronic Respiratory failure with oxygen requirement affecting pregnancy, antepartum 05/16/2016   Resolved pulmonary HTN on 2018 echo (in our epic).   Pt off O2 in 2018.   Daily headache    "sometimes" (01/08/2015)   Gastroesophageal reflux disease without esophagitis 07/13/2020   GERD (gastroesophageal reflux disease)    Hypertension    No longer takes meds   ILD (interstitial lung disease) (HCC)    Insomnia 08/21/2015   Loud P2 (pulmonary S2, second heart sound) 08/21/2015   Lung disease    Lupus (HCC)    Multifocal pneumonia    Pulmonary hypertension (HCC)    Sjogren's syndrome (HCC)    SS-A antibody positive 06/02/2016   SS-B antibody positive 06/02/2016   STD (sexually transmitted disease)    Chlamydia    Past Surgical History:   Procedure Laterality Date   CARDIAC CATHETERIZATION N/A 09/09/2015   Procedure: Right Heart Cath;  Surgeon: Laurey Morale, MD;  Location: [facility] INVASIVE CV LAB;  Service: Cardiovascular;  Laterality: N/A;   DILATION AND EVACUATION N/A 07/13/2015   Procedure: DILATATION AND EVACUATION;  Surgeon: Levie Heritage, DO;  Location: [facility] ORS;  Service: Gynecology;  Laterality: N/A;   FINGER SURGERY Right 03/2014   "laceration, nerve/artery injury" 2nd digit   VIDEO BRONCHOSCOPY Bilateral 01/12/2015   Procedure: VIDEO BRONCHOSCOPY WITH FLUORO;  Surgeon: Leslye Peer, MD;  Location: [facility] ENDOSCOPY;  Service: Cardiopulmonary;  Laterality: Bilateral;    Family History  Problem Relation Age of Onset   Diabetes Mother    Arthritis Father    Cancer Brother        Found in jaw area   Diabetes Maternal Aunt    Diabetes Maternal Grandmother     Social History   Tobacco Use   Smoking status: Former    Current packs/day: 0.00    Average packs/day: 0.1 packs/day for 5.0 years (0.5 ttl pk-yrs)    Types: Cigarettes    Start date: 11/20/2009    Quit date: 11/20/2014  Years since quitting: 8.5   Smokeless tobacco: Never  Vaping Use   Vaping status: Never Used  Substance Use Topics   Alcohol use: Not Currently    Comment: Occas   Drug use: No    Allergies:  Allergies  Allergen Reactions   Hydrocodone Nausea And Vomiting   Zithromax [Azithromycin] Itching and Cough    Medications Prior to Admission  Medication Sig Dispense Refill Last Dose   acetaminophen (TYLENOL) 500 MG tablet Take 1,000 mg by mouth every 6 (six) hours as needed.   06/05/2023   aspirin EC 81 MG tablet Take 1 tablet (81 mg total) by mouth daily. Swallow whole. 30 tablet 12 06/06/2023   azaTHIOprine (IMURAN) 50 MG tablet Take 50 mg by mouth daily.   06/06/2023   hydroxychloroquine (PLAQUENIL) 200 MG tablet Take 200 mg by mouth 2 (two) times daily.   06/06/2023   pantoprazole (PROTONIX) 40 MG tablet Take 1 tablet (40 mg total) by  mouth 2 (two) times daily before a meal. 60 tablet 5 Past Week   predniSONE (DELTASONE) 5 MG tablet Take 5 mg by mouth daily with breakfast.   06/06/2023   Prenatal 28-0.8 MG TABS Take 1 tablet by mouth daily. 30 tablet 12 06/06/2023   acetaminophen-caffeine (EXCEDRIN TENSION HEADACHE) 500-65 MG TABS per tablet Take 2 tablets by mouth every 6 (six) hours as needed (headache). (Patient not taking: Reported on 05/24/2023) 60 tablet 0    albuterol (VENTOLIN HFA) 108 (90 Base) MCG/ACT inhaler Inhale 2 puffs into the lungs every 6 (six) hours as needed for wheezing or shortness of breath. 8 g 2    cyclobenzaprine (FLEXERIL) 10 MG tablet Take 1 tablet (10 mg total) by mouth 2 (two) times daily as needed for muscle spasms. 20 tablet 0    ipratropium (ATROVENT HFA) 17 MCG/ACT inhaler Inhale 2 puffs into the lungs every 6 (six) hours as needed for wheezing. 1 each 12    Magnesium 400 MG TABS Take 1 tablet by mouth daily. (Patient not taking: Reported on 05/24/2023) 90 tablet 3     Review of Systems Physical Exam   Blood pressure 116/63, pulse 80, temperature 97.9 F (36.6 C), temperature source Oral, resp. rate 18, height 5\' 3"  (1.6 m), weight 86.4 kg, last menstrual period 12/05/2022, SpO2 97%.  Physical Exam Pulmonary:     Effort: Pulmonary effort is normal.  Abdominal:     Palpations: Abdomen is soft.  Genitourinary:    General: Normal vulva.     Comments: Pelvic exam: External genitalia normal, clitoral hood piercing SE: vaginal walls pink and well rugated, cervix is smooth, pink, no lesions, moderate amt of thin, watery, grayish-white vaginal d/c -- WP, GC/CT done, cervix visually closed, bimanual deferred. Musculoskeletal:        General: Normal range of motion.  Skin:    General: Skin is warm and dry.  Neurological:     Mental Status: She is oriented to person, place, and time.  Psychiatric:        Mood and Affect: Mood normal.        Behavior: Behavior normal.        Thought Content:  Thought content normal.        Judgment: Judgment normal.   REASSURING NST - FHR: 145 bpm / moderate variability / accels present / decels absent / TOCO: none MAU Course  Procedures  MDM Wet Prep GC/CT -- Results pending   Results for orders placed or performed during the hospital encounter  of 06/06/23 (from the past 24 hour(s))  Wet prep, genital     Status: Abnormal   Collection Time: 06/06/23 10:43 AM   Specimen: PATH Cytology Cervicovaginal Ancillary Only  Result Value Ref Range   Yeast Wet Prep HPF POC NONE SEEN NONE SEEN   Trich, Wet Prep NONE SEEN NONE SEEN   Clue Cells Wet Prep HPF POC NONE SEEN NONE SEEN   WBC, Wet Prep HPF POC >=10 (A) <10   Sperm NONE SEEN   Urinalysis, Routine w reflex microscopic -Urine, Clean Catch     Status: None   Collection Time: 06/06/23 10:44 AM  Result Value Ref Range   Color, Urine YELLOW YELLOW   APPearance CLEAR CLEAR   Specific Gravity, Urine 1.015 1.005 - 1.030   pH 6.0 5.0 - 8.0   Glucose, UA NEGATIVE NEGATIVE mg/dL   Hgb urine dipstick NEGATIVE NEGATIVE   Bilirubin Urine NEGATIVE NEGATIVE   Ketones, ur NEGATIVE NEGATIVE mg/dL   Protein, ur NEGATIVE NEGATIVE mg/dL   Nitrite NEGATIVE NEGATIVE   Leukocytes,Ua NEGATIVE NEGATIVE    Assessment and Plan  1. Vaginal discharge during pregnancy in second trimester - Alternative Vaginitis Therapies  1) soak in tub of warm water waist high with 1/2 cup of baking soda in water for ~ 20 mins.  OR 2) take 1 tablespoon of fractionated (liquid form) coconut oil with 10 drops of Melaleuca (Tea Tree) essential oil, mix well and wipe 1 saturated cotton ball externally at bedtime x 3 days.   Both options are to be done after sexual intercourse, menses and/or when suspects Bacterial Vaginosis and/or yeast infection. Please be advised that these alternatives will not replace the need to be evaluated, if symptoms persist. You will need to seek care at an OB/GYN provider.  GO WHITE: Soap: UNSCENTED  Dove (white box light green writing) Laundry detergent (underwear)- Dreft or Arm n' Hammer unscented WHITE 100% cotton panties (NOT just cotton crouch) Sanitary napkin/panty liners: UNSCENTED.  If it doesn't SAY unscented it can have a scent/perfume    NO PERFUMES OR LOTIONS OR POTIONS in the vulvar (lips) area (may use water-based or silicone-based lubricant) Condoms: hypoallergenic only. Non dyed (no color) Toilet papers: white only Wash clothes: use a separate wash cloth. WHITE.  Wash in [place].   You can purchase fractionated (liquid form) coconut oil and Tea Tree (Melaluca) Oil locally at:  [employer] [address] [place], [place] [address] [phone]  [facility] [address] [place], [place] [address] [phone]    2. [redacted] weeks gestation of pregnancy   - Discharge patient - Keep scheduled appt with [facility] on 06/09/2023  - Patient verbalized an understanding of the plan of care and agrees.  Raelyn Mora, CNM 06/06/2023, 10:32 AM

## 2023-06-06 NOTE — MAU Note (Signed)
Kayla [name] is a 33 y.o. at [redacted]w[redacted]d here in MAU reporting: she's having abdominal cramping and vaginal discharge.  Reports vaginal discharge is whitish yellow that has a slight odor.  Reports is having vaginal itching.  Denies VB or LOF.  Endorses +FM. LMP: 12/05/2022 Onset of complaint: 2 days ago Pain score: 6 Vitals:   06/06/23 0953  BP: 116/63  Pulse: 80  Resp: 18  Temp: 97.9 F (36.6 C)  SpO2: 97%     FHT:148 bpm Lab orders placed from triage:   UA

## 2023-06-06 NOTE — Discharge Instructions (Addendum)
Alternative Vaginitis Therapies  1) soak in tub of warm water waist high with 1/2 cup of baking soda in water for ~ 20 mins.  OR 2) 1 tablespoon of fractionated (liquid form) coconut oil with 10 drops of Melaleuca (Tea Tree) essential oil, mix well and wipe 1 saturated cotton ball externally at bedtime x 3 days.   Both options are to be done after sexual intercourse, menses and/or when suspects Bacterial Vaginosis and/or yeast infection. Please be advised that these alternatives will not replace the need to be evaluated, if symptoms persist. You will need to seek care at an OB/GYN provider.  GO WHITE: Soap: UNSCENTED Dove (white box light green writing) Laundry detergent (underwear)- Dreft or Arm n' Hammer unscented WHITE 100% cotton panties (NOT just cotton crouch) Sanitary napkin/panty liners: UNSCENTED.  If it doesn't SAY unscented it can have a scent/perfume    NO PERFUMES OR LOTIONS OR POTIONS in the vulvar (lips) area (may use water-based or silicone-based lubricant) Condoms: hypoallergenic only. Non dyed (no color) Toilet papers: white only Wash clothes: use a separate wash cloth. WHITE.  Wash in [place].   You can purchase fractionated (liquid form) coconut oil and Tea Tree (Melaluca) Oil locally at:  [employer] [address] [place], [place] [address] [phone]  [facility] [address] [place], [place] [address] [phone]

## 2023-06-07 LAB — GC/CHLAMYDIA PROBE AMP (~~LOC~~) NOT AT ARMC
Chlamydia: NEGATIVE
Comment: NEGATIVE
Comment: NORMAL
Neisseria Gonorrhea: NEGATIVE

## 2023-06-09 ENCOUNTER — Other Ambulatory Visit: Payer: Medicare HMO

## 2023-06-09 ENCOUNTER — Encounter: Payer: Medicare HMO | Admitting: Obstetrics & Gynecology

## 2023-06-16 ENCOUNTER — Encounter: Payer: Self-pay | Admitting: *Deleted

## 2023-06-19 ENCOUNTER — Encounter: Payer: Self-pay | Admitting: Family Medicine

## 2023-06-21 ENCOUNTER — Ambulatory Visit: Payer: Medicare HMO | Attending: Maternal & Fetal Medicine

## 2023-06-21 ENCOUNTER — Other Ambulatory Visit: Payer: Self-pay

## 2023-06-21 ENCOUNTER — Other Ambulatory Visit: Payer: Self-pay | Admitting: *Deleted

## 2023-06-21 ENCOUNTER — Ambulatory Visit: Payer: Medicare HMO | Admitting: *Deleted

## 2023-06-21 VITALS — BP 117/57 | HR 72

## 2023-06-21 DIAGNOSIS — M329 Systemic lupus erythematosus, unspecified: Secondary | ICD-10-CM

## 2023-06-21 DIAGNOSIS — O0990 Supervision of high risk pregnancy, unspecified, unspecified trimester: Secondary | ICD-10-CM | POA: Diagnosis present

## 2023-06-21 DIAGNOSIS — E669 Obesity, unspecified: Secondary | ICD-10-CM

## 2023-06-21 DIAGNOSIS — O99212 Obesity complicating pregnancy, second trimester: Secondary | ICD-10-CM

## 2023-06-21 DIAGNOSIS — Z8759 Personal history of other complications of pregnancy, childbirth and the puerperium: Secondary | ICD-10-CM

## 2023-06-21 DIAGNOSIS — O10912 Unspecified pre-existing hypertension complicating pregnancy, second trimester: Secondary | ICD-10-CM | POA: Insufficient documentation

## 2023-06-21 DIAGNOSIS — O10013 Pre-existing essential hypertension complicating pregnancy, third trimester: Secondary | ICD-10-CM

## 2023-06-21 DIAGNOSIS — O10913 Unspecified pre-existing hypertension complicating pregnancy, third trimester: Secondary | ICD-10-CM

## 2023-06-21 DIAGNOSIS — O99513 Diseases of the respiratory system complicating pregnancy, third trimester: Secondary | ICD-10-CM

## 2023-06-21 DIAGNOSIS — O09293 Supervision of pregnancy with other poor reproductive or obstetric history, third trimester: Secondary | ICD-10-CM | POA: Diagnosis not present

## 2023-06-21 DIAGNOSIS — O26893 Other specified pregnancy related conditions, third trimester: Secondary | ICD-10-CM | POA: Diagnosis not present

## 2023-06-21 DIAGNOSIS — Z3A28 28 weeks gestation of pregnancy: Secondary | ICD-10-CM

## 2023-06-21 DIAGNOSIS — Z87891 Personal history of nicotine dependence: Secondary | ICD-10-CM

## 2023-06-21 DIAGNOSIS — O99213 Obesity complicating pregnancy, third trimester: Secondary | ICD-10-CM

## 2023-06-21 DIAGNOSIS — J849 Interstitial pulmonary disease, unspecified: Secondary | ICD-10-CM

## 2023-06-22 ENCOUNTER — Other Ambulatory Visit: Payer: Medicare HMO

## 2023-06-22 ENCOUNTER — Ambulatory Visit (INDEPENDENT_AMBULATORY_CARE_PROVIDER_SITE_OTHER): Payer: Medicare HMO | Admitting: Family Medicine

## 2023-06-22 ENCOUNTER — Other Ambulatory Visit: Payer: Self-pay

## 2023-06-22 VITALS — BP 106/74 | HR 82 | Wt 190.0 lb

## 2023-06-22 DIAGNOSIS — O0993 Supervision of high risk pregnancy, unspecified, third trimester: Secondary | ICD-10-CM

## 2023-06-22 DIAGNOSIS — Z3A29 29 weeks gestation of pregnancy: Secondary | ICD-10-CM

## 2023-06-22 DIAGNOSIS — M329 Systemic lupus erythematosus, unspecified: Secondary | ICD-10-CM

## 2023-06-22 DIAGNOSIS — Z23 Encounter for immunization: Secondary | ICD-10-CM

## 2023-06-22 DIAGNOSIS — Z8759 Personal history of other complications of pregnancy, childbirth and the puerperium: Secondary | ICD-10-CM

## 2023-06-22 DIAGNOSIS — O0990 Supervision of high risk pregnancy, unspecified, unspecified trimester: Secondary | ICD-10-CM

## 2023-06-22 DIAGNOSIS — K219 Gastro-esophageal reflux disease without esophagitis: Secondary | ICD-10-CM

## 2023-06-22 DIAGNOSIS — I10 Essential (primary) hypertension: Secondary | ICD-10-CM

## 2023-06-22 DIAGNOSIS — O99891 Other specified diseases and conditions complicating pregnancy: Secondary | ICD-10-CM

## 2023-06-22 MED ORDER — SUCRALFATE 1 GM/10ML PO SUSP
1.0000 g | Freq: Three times a day (TID) | ORAL | 0 refills | Status: DC
Start: 2023-06-22 — End: 2023-08-01

## 2023-06-22 MED ORDER — FAMOTIDINE 20 MG PO TABS: 20.0000 mg | ORAL_TABLET | Freq: Two times a day (BID) | ORAL | 2 refills | Status: DC

## 2023-06-22 NOTE — Progress Notes (Signed)
   PRENATAL VISIT NOTE  Subjective:  Kayla [name] is a 32 y.o. [id number] at [redacted]w[redacted]d being seen today for ongoing prenatal care.  She is currently monitored for the following issues for this high-risk pregnancy and has Systemic lupus erythematosus (SLE) affecting pregnancy, antepartum (HCC); Chronic hypertension; History of IUFD; Supervision of high risk pregnancy, antepartum, RED CHART patient; Alpha thalassemia silent carrier; and Obesity affecting pregnancy on their problem list.  Patient reports no complaints.  Contractions: Not present. Vag. Bleeding: None.  Movement: Present. Denies leaking of fluid.   The following portions of the patient's history were reviewed and updated as appropriate: allergies, current medications, past family history, past medical history, past social history, past surgical history and problem list.   Objective:   Vitals:   06/22/23 1001  BP: 106/74  Pulse: 82  Weight: 190 lb (86.2 kg)    Fetal Status: Fetal Heart Rate (bpm): 150 Fundal Height: 29 cm Movement: Present     General:  Alert, oriented and cooperative. Patient is in no acute distress.  Skin: Skin is warm and dry. No rash noted.   Cardiovascular: Normal heart rate noted  Respiratory: Normal respiratory effort, no problems with respiration noted  Abdomen: Soft, gravid, appropriate for gestational age.  Pain/Pressure: Absent     Pelvic: Cervical exam deferred        Extremities: Normal range of motion.     Mental Status: Normal mood and affect. Normal behavior. Normal judgment and thought content.   Assessment and Plan:  Pregnancy: [id number] at [redacted]w[redacted]d 1. Supervision of high risk pregnancy, antepartum 28 week labs and TDaP today - Tdap vaccine greater than or equal to 7yo IM  2. Systemic lupus erythematosus (SLE) affecting pregnancy, antepartum (HCC) On Imuran, Plaquenil and Prednisone  3. History of IUFD Will need testing  4. Chronic hypertension BP is well controlled on no meds On  ASA  5. Gastroesophageal reflux disease without esophagitis Worsening heartburn, on PPI Add Carafate and H2 blocker - sucralfate (CARAFATE) 1 GM/10ML suspension; Take 10 mLs (1 g total) by mouth 4 (four) times daily -  with meals and at bedtime.  Dispense: 420 mL; Refill: 0 - famotidine (PEPCID) 20 MG tablet; Take 1 tablet (20 mg total) by mouth 2 (two) times daily.  Dispense: 180 tablet; Refill: 2  Preterm labor symptoms and general obstetric precautions including but not limited to vaginal bleeding, contractions, leaking of fluid and fetal movement were reviewed in detail with the patient. Please refer to After Visit Summary for other counseling recommendations.   Return in 2 weeks (on 07/06/2023).  Future Appointments  Date Time Provider Department Center  06/29/2023 11:40 AM Thomasene Ripple, DO [facility] None  07/19/2023 10:45 AM [facility] NURSE [facility]  07/19/2023 11:00 AM [facility] US1 [facility]  07/26/2023  8:45 AM [facility] NURSE [facility]  07/26/2023  9:00 AM [facility] US1 [facility]  08/01/2023 10:15 AM [facility] NURSE [facility]  08/01/2023 10:30 AM [facility] US2 [facility]    Reva Bores, MD

## 2023-06-23 ENCOUNTER — Encounter: Payer: Self-pay | Admitting: Family Medicine

## 2023-06-23 DIAGNOSIS — D696 Thrombocytopenia, unspecified: Secondary | ICD-10-CM | POA: Insufficient documentation

## 2023-06-23 DIAGNOSIS — O99119 Other diseases of the blood and blood-forming organs and certain disorders involving the immune mechanism complicating pregnancy, unspecified trimester: Secondary | ICD-10-CM | POA: Insufficient documentation

## 2023-06-29 ENCOUNTER — Ambulatory Visit: Payer: Medicare HMO | Admitting: Cardiology

## 2023-07-06 ENCOUNTER — Ambulatory Visit (INDEPENDENT_AMBULATORY_CARE_PROVIDER_SITE_OTHER): Payer: Medicare HMO | Admitting: Obstetrics and Gynecology

## 2023-07-06 ENCOUNTER — Other Ambulatory Visit (HOSPITAL_COMMUNITY)
Admission: RE | Admit: 2023-07-06 | Discharge: 2023-07-06 | Disposition: A | Discharge status: Home / Self Care | Payer: Medicare HMO | Source: Ambulatory Visit | Attending: Obstetrics and Gynecology | Admitting: Obstetrics and Gynecology

## 2023-07-06 VITALS — BP 110/68 | HR 99 | Temp 98.0°F | Wt 192.8 lb

## 2023-07-06 DIAGNOSIS — O99891 Other specified diseases and conditions complicating pregnancy: Secondary | ICD-10-CM

## 2023-07-06 DIAGNOSIS — Z3A31 31 weeks gestation of pregnancy: Secondary | ICD-10-CM

## 2023-07-06 DIAGNOSIS — N898 Other specified noninflammatory disorders of vagina: Secondary | ICD-10-CM

## 2023-07-06 DIAGNOSIS — M329 Systemic lupus erythematosus, unspecified: Secondary | ICD-10-CM

## 2023-07-06 DIAGNOSIS — O0990 Supervision of high risk pregnancy, unspecified, unspecified trimester: Secondary | ICD-10-CM

## 2023-07-06 DIAGNOSIS — I10 Essential (primary) hypertension: Secondary | ICD-10-CM

## 2023-07-06 DIAGNOSIS — Z8759 Personal history of other complications of pregnancy, childbirth and the puerperium: Secondary | ICD-10-CM

## 2023-07-06 NOTE — Patient Instructions (Signed)

## 2023-07-06 NOTE — Progress Notes (Signed)
   PRENATAL VISIT NOTE  Subjective:  Kayla [name] is a 32 y.o. [id number] at [redacted]w[redacted]d being seen today for ongoing prenatal care.  She is currently monitored for the following issues for this high-risk pregnancy and has Systemic lupus erythematosus (SLE) affecting pregnancy, antepartum (HCC); Chronic hypertension; History of IUFD; Supervision of high risk pregnancy, antepartum, RED CHART patient; Alpha thalassemia silent carrier; Obesity affecting pregnancy; and Thrombocytopenia affecting pregnancy (HCC) on their problem list.  Patient reports  vaginal itching and discharge .  Contractions: Not present. Vag. Bleeding: None.  Movement: Present. Denies leaking of fluid.   The following portions of the patient's history were reviewed and updated as appropriate: allergies, current medications, past family history, past medical history, past social history, past surgical history and problem list.   Objective:   Vitals:   07/06/23 1048  BP: 110/68  Pulse: 99  Temp: 98 F (36.7 C)  Weight: 192 lb 12.8 oz (87.5 kg)    Fetal Status: Fetal Heart Rate (bpm): 145   Movement: Present     General:  Alert, oriented and cooperative. Patient is in no acute distress.  Skin: Skin is warm and dry. No rash noted.   Cardiovascular: Normal heart rate noted  Respiratory: Normal respiratory effort, no problems with respiration noted  Abdomen: Soft, gravid, appropriate for gestational age.  Pain/Pressure: Present     Pelvic: Cervical exam deferred        Extremities: Normal range of motion.  Edema: None  Mental Status: Normal mood and affect. Normal behavior. Normal judgment and thought content.   Assessment and Plan:  Pregnancy: [id number] at [redacted]w[redacted]d 1. Supervision of high risk pregnancy, antepartum, RED CHART patient BP and FHR normal Feeling regular fetal movement  2. Systemic lupus erythematosus (SLE) affecting pregnancy, antepartum (HCC) Takes Imuran, plaqueril and prednisone  7/31 u/s  EFW 92%, AFI  nml Repeat  u/s 8/28 3. History of IUFD Antenatal testing   4. Chronic hypertension Normotensive, no meds Continue ASA  5. Vaginal discharge Swab collected - Cervicovaginal ancillary only   Preterm labor symptoms and general obstetric precautions including but not limited to vaginal bleeding, contractions, leaking of fluid and fetal movement were reviewed in detail with the patient. Please refer to After Visit Summary for other counseling recommendations.   Return in 2 weeks for routine prenatal with MD   Future Appointments  Date Time Provider Department Center  07/19/2023 10:45 AM [facility] NURSE [facility]  07/19/2023 11:00 AM [facility] US1 [facility]  07/26/2023  8:45 AM [facility] NURSE [facility]  07/26/2023  9:00 AM [facility] US1 [facility]  08/01/2023 10:15 AM [facility] NURSE [facility]  08/01/2023 10:30 AM [facility] US2 [facility]  08/24/2023 11:00 AM Tobb, Kardie, DO [facility] None    Albertine Grates, FNP

## 2023-07-07 LAB — CERVICOVAGINAL ANCILLARY ONLY
Bacterial Vaginitis (gardnerella): NEGATIVE
Candida Glabrata: NEGATIVE
Candida Vaginitis: POSITIVE — AB
Comment: NEGATIVE
Comment: NEGATIVE
Comment: NEGATIVE

## 2023-07-07 MED ORDER — FLUCONAZOLE 150 MG PO TABS
150.0000 mg | ORAL_TABLET | Freq: Once | ORAL | 0 refills | Status: AC
Start: 2023-07-07 — End: 2023-07-07

## 2023-07-07 NOTE — Addendum Note (Signed)
Addended by: Sue Lush on: 07/07/2023 12:14 PM   Modules accepted: Orders

## 2023-07-12 ENCOUNTER — Inpatient Hospital Stay (HOSPITAL_COMMUNITY)
Admission: AD | Admit: 2023-07-12 | Discharge: 2023-07-12 | Disposition: A | Discharge status: Home / Self Care | Payer: Medicare HMO | Attending: Obstetrics and Gynecology | Admitting: Obstetrics and Gynecology

## 2023-07-12 ENCOUNTER — Inpatient Hospital Stay (HOSPITAL_COMMUNITY): Payer: Medicare HMO

## 2023-07-12 ENCOUNTER — Encounter (HOSPITAL_COMMUNITY): Payer: Self-pay | Admitting: Obstetrics and Gynecology

## 2023-07-12 DIAGNOSIS — J439 Emphysema, unspecified: Secondary | ICD-10-CM | POA: Diagnosis not present

## 2023-07-12 DIAGNOSIS — J849 Interstitial pulmonary disease, unspecified: Secondary | ICD-10-CM | POA: Diagnosis not present

## 2023-07-12 DIAGNOSIS — O10913 Unspecified pre-existing hypertension complicating pregnancy, third trimester: Secondary | ICD-10-CM | POA: Insufficient documentation

## 2023-07-12 DIAGNOSIS — B348 Other viral infections of unspecified site: Secondary | ICD-10-CM

## 2023-07-12 DIAGNOSIS — O99513 Diseases of the respiratory system complicating pregnancy, third trimester: Secondary | ICD-10-CM | POA: Diagnosis not present

## 2023-07-12 DIAGNOSIS — M549 Dorsalgia, unspecified: Secondary | ICD-10-CM | POA: Insufficient documentation

## 2023-07-12 DIAGNOSIS — Z79899 Other long term (current) drug therapy: Secondary | ICD-10-CM | POA: Diagnosis not present

## 2023-07-12 DIAGNOSIS — O26893 Other specified pregnancy related conditions, third trimester: Secondary | ICD-10-CM | POA: Insufficient documentation

## 2023-07-12 DIAGNOSIS — Z3A31 31 weeks gestation of pregnancy: Secondary | ICD-10-CM

## 2023-07-12 DIAGNOSIS — Z7952 Long term (current) use of systemic steroids: Secondary | ICD-10-CM | POA: Insufficient documentation

## 2023-07-12 DIAGNOSIS — O98513 Other viral diseases complicating pregnancy, third trimester: Secondary | ICD-10-CM | POA: Diagnosis present

## 2023-07-12 DIAGNOSIS — O99613 Diseases of the digestive system complicating pregnancy, third trimester: Secondary | ICD-10-CM | POA: Insufficient documentation

## 2023-07-12 DIAGNOSIS — R102 Pelvic and perineal pain unspecified side: Secondary | ICD-10-CM

## 2023-07-12 DIAGNOSIS — R0602 Shortness of breath: Secondary | ICD-10-CM

## 2023-07-12 LAB — RESPIRATORY PANEL BY PCR

## 2023-07-12 LAB — URINALYSIS, ROUTINE W REFLEX MICROSCOPIC
Glucose, UA: NEGATIVE mg/dL
Hgb urine dipstick: NEGATIVE
Ketones, ur: 5 mg/dL — AB
Nitrite: NEGATIVE
Protein, ur: 30 mg/dL — AB
Specific Gravity, Urine: 1.027 (ref 1.005–1.030)
pH: 5 (ref 5.0–8.0)

## 2023-07-12 LAB — CBC
HCT: 35.3 % — ABNORMAL LOW (ref 36.0–46.0)
Hemoglobin: 11.5 g/dL — ABNORMAL LOW (ref 12.0–15.0)
MCH: 27 pg (ref 26.0–34.0)
MCHC: 32.6 g/dL (ref 30.0–36.0)
MCV: 82.9 fL (ref 80.0–100.0)
Platelets: UNDETERMINED 10*3/uL (ref 150–400)
RBC: 4.26 MIL/uL (ref 3.87–5.11)
RDW: 13 % (ref 11.5–15.5)
WBC: 7.6 10*3/uL (ref 4.0–10.5)
nRBC: 0 % (ref 0.0–0.2)

## 2023-07-12 LAB — COMPREHENSIVE METABOLIC PANEL
ALT: 12 U/L (ref 0–44)
AST: 16 U/L (ref 15–41)
Albumin: 2.8 g/dL — ABNORMAL LOW (ref 3.5–5.0)
Alkaline Phosphatase: 62 U/L (ref 38–126)
Anion gap: 9 (ref 5–15)
BUN: 5 mg/dL — ABNORMAL LOW (ref 6–20)
CO2: 19 mmol/L — ABNORMAL LOW (ref 22–32)
Calcium: 8.9 mg/dL (ref 8.9–10.3)
Chloride: 106 mmol/L (ref 98–111)
Creatinine, Ser: 0.55 mg/dL (ref 0.44–1.00)
GFR, Estimated: >60 mL/min (ref >60)
Glucose, Bld: 96 mg/dL (ref 70–99)
Potassium: 3.5 mmol/L (ref 3.5–5.1)
Sodium: 134 mmol/L — ABNORMAL LOW (ref 135–145)
Total Bilirubin: 0.3 mg/dL (ref 0.3–1.2)
Total Protein: 6.3 g/dL — ABNORMAL LOW (ref 6.5–8.1)

## 2023-07-12 LAB — BRAIN NATRIURETIC PEPTIDE: B Natriuretic Peptide: 17.3 pg/mL (ref 0.0–100.0)

## 2023-07-12 LAB — TROPONIN I (HIGH SENSITIVITY): Troponin I (High Sensitivity): 3 ng/L (ref <18)

## 2023-07-12 NOTE — MAU Provider Note (Signed)
History     [id number]  Arrival date and time: 07/12/23 1328    Chief Complaint  Patient presents with   Abdominal Pain   Back Pain   Shortness of Breath     HPI Kayla [name] is a 32 y.o. 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   Patient reports shortness of breath for the past two days It is mostly constant, worse with exertion or when she lays flat Endorses a cough, had runny nose earlier this week but not currently Denies chest pain No recent changes to her medication regimen No sick contacts, no recent travel No fevers No swelling in her legs  Pelvic pressure and back pain started around the same time Back pain is constant, pelvic pressure is intermittent Occasional contractions No large loss of fluid per vagina, no vaginal bleeding Fetal movement is normal  O/Positive/-- (04/04 1156)  OB History     Gravida  5   Para  2   Term  1   Preterm  1   AB  2   Living  1      SAB  2   IAB  0   Ectopic  0   Multiple  0   Live Births  1           Past Medical History:  Diagnosis Date   Acute respiratory failure with hypoxemia (HCC) 09/19/2017   Acute respiratory failure with hypoxia (HCC)    Arthritis    "hands and legs" (01/08/2015)   CAP (community acquired pneumonia) 01/07/2015   Chronic Respiratory failure with oxygen requirement affecting pregnancy, antepartum 05/16/2016   Resolved pulmonary HTN on 2018 echo (in our epic).   Pt off O2 in 2018.   Daily headache    "sometimes" (01/08/2015)   Gastroesophageal reflux disease without esophagitis 07/13/2020   GERD (gastroesophageal reflux disease)    Hypertension    No longer takes meds   ILD (interstitial lung disease) (HCC)    Insomnia 08/21/2015   Loud P2 (pulmonary S2, second heart sound) 08/21/2015   Lung disease    Lupus (HCC)    Lupus (systemic lupus erythematosus) (HCC) 08/21/2015    Multifocal pneumonia    Pulmonary hypertension (HCC)    Seasonal allergies 03/02/2023   Sjogren's syndrome (HCC)    SS-A antibody positive 06/02/2016   SS-B antibody positive 06/02/2016   STD (sexually transmitted disease)    Chlamydia   Viral illness 03/02/2023    Past Surgical History:  Procedure Laterality Date   CARDIAC CATHETERIZATION N/A 09/09/2015   Procedure: Right Heart Cath;  Surgeon: Laurey Morale, MD;  Location: [facility] INVASIVE CV LAB;  Service: Cardiovascular;  Laterality: N/A;   DILATION AND EVACUATION N/A 07/13/2015   Procedure: DILATATION AND EVACUATION;  Surgeon: Levie Heritage, DO;  Location: [facility] ORS;  Service: Gynecology;  Laterality: N/A;   FINGER SURGERY Right 03/2014   "laceration, nerve/artery injury" 2nd digit   VIDEO BRONCHOSCOPY Bilateral 01/12/2015   Procedure: VIDEO BRONCHOSCOPY WITH FLUORO;  Surgeon: Leslye Peer, MD;  Location: [facility] ENDOSCOPY;  Service: Cardiopulmonary;  Laterality: Bilateral;    Family History  Problem Relation Age of Onset   Diabetes Mother    Arthritis Father    Cancer Brother        Found in jaw area   Diabetes Maternal Aunt    Diabetes Maternal Grandmother     Social History  Socioeconomic History   Marital status: Single    Spouse name: Not on file   Number of children: Not on file   Years of education: Not on file   Highest education level: Not on file  Occupational History   Occupation: [employer]     Comment: Workers Compensation  Tobacco Use   Smoking status: Former    Current packs/day: 0.00    Average packs/day: 0.1 packs/day for 5.0 years (0.5 ttl pk-yrs)    Types: Cigarettes    Start date: 11/20/2009    Quit date: 11/20/2014    Years since quitting: 8.6   Smokeless tobacco: Never  Vaping Use   Vaping status: Never Used  Substance and Sexual Activity   Alcohol use: Not Currently    Comment: Occas   Drug use: No   Sexual activity: Not Currently    Birth control/protection: None    Comment: First IC <15  y/o, Hx of CT+  Other Topics Concern   Not on file  Social History Narrative   Not on file   Social Determinants of Health   Financial Resource Strain: Not on file  Food Insecurity: No Food Insecurity (02/23/2023)   Hunger Vital Sign    Worried About Running Out of Food in the Last Year: Never true    Ran Out of Food in the Last Year: Never true  Transportation Needs: No Transportation Needs (02/23/2023)   PRAPARE - Administrator, Civil Service (Medical): No    Lack of Transportation (Non-Medical): No  Physical Activity: Not on file  Stress: Not on file  Social Connections: Not on file  Intimate Partner Violence: Not on file    Allergies  Allergen Reactions   Hydrocodone Nausea And Vomiting   Zithromax [Azithromycin] Itching and Cough    No current facility-administered medications on file prior to encounter.   Current Outpatient Medications on File Prior to Encounter  Medication Sig Dispense Refill   acetaminophen (TYLENOL) 500 MG tablet Take 1,000 mg by mouth every 6 (six) hours as needed.     albuterol (VENTOLIN HFA) 108 (90 Base) MCG/ACT inhaler Inhale 2 puffs into the lungs every 6 (six) hours as needed for wheezing or shortness of breath. 8 g 2   aspirin EC 81 MG tablet Take 1 tablet (81 mg total) by mouth daily. Swallow whole. 30 tablet 12   pantoprazole (PROTONIX) 40 MG tablet Take 1 tablet (40 mg total) by mouth 2 (two) times daily before a meal. 60 tablet 5   predniSONE (DELTASONE) 5 MG tablet Take 5 mg by mouth daily with breakfast.     Prenatal 28-0.8 MG TABS Take 1 tablet by mouth daily. 30 tablet 12   acetaminophen-caffeine (EXCEDRIN TENSION HEADACHE) 500-65 MG TABS per tablet Take 2 tablets by mouth every 6 (six) hours as needed (headache). 60 tablet 0   azaTHIOprine (IMURAN) 50 MG tablet Take 50 mg by mouth daily.     cyclobenzaprine (FLEXERIL) 10 MG tablet Take 1 tablet (10 mg total) by mouth 2 (two) times daily as needed for muscle spasms. (Patient  not taking: Reported on 07/06/2023) 20 tablet 0   hydroxychloroquine (PLAQUENIL) 200 MG tablet Take 200 mg by mouth 2 (two) times daily.     ipratropium (ATROVENT HFA) 17 MCG/ACT inhaler Inhale 2 puffs into the lungs every 6 (six) hours as needed for wheezing. 1 each 12   Magnesium 400 MG TABS Take 1 tablet by mouth daily. (Patient not taking: Reported on 05/24/2023) 90  tablet 3   [DISCONTINUED] amLODipine (NORVASC) 5 MG tablet Take 1 tablet (5 mg total) by mouth daily.       ROS Pertinent positives and negative per HPI, all others reviewed and negative  Physical Exam   BP 118/74 (BP Location: Right Arm)   Pulse (!) 114   Temp 98.8 F (37.1 C) (Oral)   Resp 20   Ht 5\' 3"  (1.6 m)   Wt 84.8 kg   LMP 12/05/2022 (Approximate)   SpO2 98%   BMI 33.13 kg/m   Patient Vitals for the past 24 hrs:  BP Temp Temp src Pulse Resp SpO2 Height Weight  07/12/23 1800 -- -- -- -- -- 98 % -- --  07/12/23 1755 -- -- -- -- -- 97 % -- --  07/12/23 1650 -- -- -- -- -- 99 % -- --  07/12/23 1645 -- -- -- -- -- 98 % -- --  07/12/23 1640 -- -- -- -- -- 98 % -- --  07/12/23 1631 -- -- -- -- -- 96 % -- --  07/12/23 1620 -- -- -- -- -- 98 % -- --  07/12/23 1555 -- -- -- -- -- 95 % -- --  07/12/23 1543 -- -- -- -- -- 97 % -- --  07/12/23 1535 -- -- -- -- -- 96 % -- --  07/12/23 1440 -- -- -- -- -- 96 % -- --  07/12/23 1435 -- -- -- -- -- 96 % -- --  07/12/23 1411 118/74 98.8 F (37.1 C) Oral (!) 114 20 96 % 5\' 3"  (1.6 m) 84.8 kg  07/12/23 1409 118/74 -- -- (!) 113 -- 96 % -- --    Physical Exam Constitutional:      General: She is not in acute distress.    Appearance: She is not ill-appearing, toxic-appearing or diaphoretic.  HENT:     Head: Normocephalic and atraumatic.  Eyes:     General: No scleral icterus. Cardiovascular:     Rate and Rhythm: Normal rate and regular rhythm.     Heart sounds: Normal heart sounds. No murmur heard. Pulmonary:     Effort: Pulmonary effort is normal. No  respiratory distress.     Breath sounds: Rales present. No wheezing.     Comments: Fine crackles in bilateral mid to lower lung fields, no rhonchi or wheezes appreciated, good air movement throughout Abdominal:     Tenderness: There is no abdominal tenderness.     Comments: gravid  Skin:    General: Skin is warm and dry.  Neurological:     General: No focal deficit present.     Mental Status: She is alert.  Psychiatric:        Mood and Affect: Mood normal.        Behavior: Behavior normal.      Cervical Exam    Bedside Ultrasound Not performed.  My interpretation: n/a  FHT Baseline: 140 bpm Variability: Good {> 6 bpm) Accelerations: Reactive Decelerations: Absent Uterine activity: rare Cat: I  Labs Results for orders placed or performed during the hospital encounter of 07/12/23 (from the past 24 hour(s))  Urinalysis, Routine w reflex microscopic -Urine, Clean Catch     Status: Abnormal   Collection Time: 07/12/23  2:17 PM  Result Value Ref Range   Color, Urine AMBER (A) YELLOW   APPearance HAZY (A) CLEAR   Specific Gravity, Urine 1.027 1.005 - 1.030   pH 5.0 5.0 - 8.0   Glucose, UA NEGATIVE NEGATIVE mg/dL  Hgb urine dipstick NEGATIVE NEGATIVE   Bilirubin Urine SMALL (A) NEGATIVE   Ketones, ur 5 (A) NEGATIVE mg/dL   Protein, ur 30 (A) NEGATIVE mg/dL   Nitrite NEGATIVE NEGATIVE   Leukocytes,Ua TRACE (A) NEGATIVE   RBC / HPF 0-5 0 - 5 RBC/hpf   WBC, UA 6-10 0 - 5 WBC/hpf   Bacteria, UA MANY (A) NONE SEEN   Squamous Epithelial / HPF 0-5 0 - 5 /HPF   Mucus PRESENT   CBC     Status: Abnormal   Collection Time: 07/12/23  3:41 PM  Result Value Ref Range   WBC 7.6 4.0 - 10.5 K/uL   RBC 4.26 3.87 - 5.11 MIL/uL   Hemoglobin 11.5 (L) 12.0 - 15.0 g/dL   HCT 16.1 (L) 09.6 - 04.5 %   MCV 82.9 80.0 - 100.0 fL   MCH 27.0 26.0 - 34.0 pg   MCHC 32.6 30.0 - 36.0 g/dL   RDW 40.9 81.1 - 91.4 %   Platelets PLATELET CLUMPS NOTED ON SMEAR, UNABLE TO ESTIMATE 150 - 400 K/uL    nRBC 0.0 0.0 - 0.2 %  Comprehensive metabolic panel     Status: Abnormal   Collection Time: 07/12/23  3:41 PM  Result Value Ref Range   Sodium 134 (L) 135 - 145 mmol/L   Potassium 3.5 3.5 - 5.1 mmol/L   Chloride 106 98 - 111 mmol/L   CO2 19 (L) 22 - 32 mmol/L   Glucose, Bld 96 70 - 99 mg/dL   BUN 5 (L) 6 - 20 mg/dL   Creatinine, Ser 7.82 0.44 - 1.00 mg/dL   Calcium 8.9 8.9 - 95.6 mg/dL   Total Protein 6.3 (L) 6.5 - 8.1 g/dL   Albumin 2.8 (L) 3.5 - 5.0 g/dL   AST 16 15 - 41 U/L   ALT 12 0 - 44 U/L   Alkaline Phosphatase 62 38 - 126 U/L   Total Bilirubin 0.3 0.3 - 1.2 mg/dL   GFR, Estimated >21 >30 mL/min   Anion gap 9 5 - 15  Troponin I (High Sensitivity)     Status: None   Collection Time: 07/12/23  3:41 PM  Result Value Ref Range   Troponin I (High Sensitivity) 3 <18 ng/L  Respiratory (~20 pathogens) panel by PCR     Status: Abnormal   Collection Time: 07/12/23  3:41 PM   Specimen: Nasopharyngeal Swab; Respiratory  Result Value Ref Range   Adenovirus NOT DETECTED NOT DETECTED   Coronavirus 229E NOT DETECTED NOT DETECTED   Coronavirus HKU1 NOT DETECTED NOT DETECTED   Coronavirus NL63 NOT DETECTED NOT DETECTED   Coronavirus OC43 NOT DETECTED NOT DETECTED   Metapneumovirus NOT DETECTED NOT DETECTED   Rhinovirus / Enterovirus DETECTED (A) NOT DETECTED   Influenza A NOT DETECTED NOT DETECTED   Influenza B NOT DETECTED NOT DETECTED   Parainfluenza Virus 1 NOT DETECTED NOT DETECTED   Parainfluenza Virus 2 NOT DETECTED NOT DETECTED   Parainfluenza Virus 3 NOT DETECTED NOT DETECTED   Parainfluenza Virus 4 NOT DETECTED NOT DETECTED   Respiratory Syncytial Virus NOT DETECTED NOT DETECTED   Bordetella pertussis NOT DETECTED NOT DETECTED   Bordetella Parapertussis NOT DETECTED NOT DETECTED   Chlamydophila pneumoniae NOT DETECTED NOT DETECTED   Mycoplasma pneumoniae NOT DETECTED NOT DETECTED  Brain natriuretic peptide     Status: None   Collection Time: 07/12/23  3:50 PM  Result  Value Ref Range   B Natriuretic Peptide 17.3 0.0 - 100.0 pg/mL  Imaging DG Chest Portable 1 View  Result Date: 07/12/2023 CLINICAL DATA:  SOB.  Emphysema.  ILD and lupus EXAM: PORTABLE CHEST 1 VIEW COMPARISON:  05/03/23 CXR and CTA FINDINGS: The heart size and mediastinal contours are within normal limits. Diffuse interstitial changes once again identified, similar to previous when adjusting for level of inflation. More focal bandlike areas again seen in the midlung zones bilaterally. No pneumothorax or effusion. Normal cardiopericardial silhouette. The visualized skeletal structures are unremarkable. IMPRESSION: Decreasing inflation from previous with persistent diffuse interstitial changes. Please correlate with the history. No consolidation or effusion Electronically Signed   By: Karen Kays M.D.   On: 07/12/2023 16:54    MAU Course  Procedures Lab Orders         Respiratory (~20 pathogens) panel by PCR         Urinalysis, Routine w reflex microscopic -Urine, Clean Catch         CBC         Comprehensive metabolic panel         Brain natriuretic peptide    No orders of the defined types were placed in this encounter.  Imaging Orders         DG Chest Portable 1 View      MDM Moderate (Level 3-4)  Assessment and Plan  #Shortness of breath #ILD #Lupus #Rhinovirus infection #[redacted] weeks gestation of pregnancy Patient presenting with several days of shortness of breath and cough. Lung exam unchanged from prior with mid to lower lung fields with fine crackles. Has had normal vitals including oxygenation throughout MAU stay. Extensive workup completed with only notable for respiratory panel with +rhinovirus. ECG normal, as were BNP and troponin, low suspicion for cardiac etiology. No tachycardia or hypoxia to suggest PE. No evidence of lung or intrathoracic pathology on CXR. Discussed with patient treatment is conservative. Reviewed increased risk of progression due to underlying ILD and  need to return if symptoms worsen.   #Pelvic pressure/back pain Consistent with advancing gestational age, no significant contractions on monitor.   #FWB FHT Cat I NST: Reactive   Dispo: discharged to home in stable condition    Venora Maples, MD/MPH 07/12/23 6:14 PM  Allergies as of 07/12/2023       Reactions   Hydrocodone Nausea And Vomiting   Zithromax [azithromycin] Itching, Cough        Medication List     TAKE these medications    acetaminophen 500 MG tablet Commonly known as: TYLENOL Take 1,000 mg by mouth every 6 (six) hours as needed.   acetaminophen-caffeine 500-65 MG Tabs per tablet Commonly known as: EXCEDRIN TENSION HEADACHE Take 2 tablets by mouth every 6 (six) hours as needed (headache).   albuterol 108 (90 Base) MCG/ACT inhaler Commonly known as: VENTOLIN HFA Inhale 2 puffs into the lungs every 6 (six) hours as needed for wheezing or shortness of breath.   aspirin EC 81 MG tablet Take 1 tablet (81 mg total) by mouth daily. Swallow whole.   azaTHIOprine 50 MG tablet Commonly known as: IMURAN Take 50 mg by mouth daily.   cyclobenzaprine 10 MG tablet Commonly known as: FLEXERIL Take 1 tablet (10 mg total) by mouth 2 (two) times daily as needed for muscle spasms.   famotidine 20 MG tablet Commonly known as: Pepcid Take 1 tablet (20 mg total) by mouth 2 (two) times daily.   hydroxychloroquine 200 MG tablet Commonly known as: PLAQUENIL Take 200 mg by mouth 2 (two) times daily.  ipratropium 17 MCG/ACT inhaler Commonly known as: ATROVENT HFA Inhale 2 puffs into the lungs every 6 (six) hours as needed for wheezing.   Magnesium 400 MG Tabs Take 1 tablet by mouth daily.   pantoprazole 40 MG tablet Commonly known as: Protonix Take 1 tablet (40 mg total) by mouth 2 (two) times daily before a meal.   predniSONE 5 MG tablet Commonly known as: DELTASONE Take 5 mg by mouth daily with breakfast.   Prenatal 28-0.8 MG Tabs Take 1 tablet  by mouth daily.   sucralfate 1 GM/10ML suspension Commonly known as: Carafate Take 10 mLs (1 g total) by mouth 4 (four) times daily -  with meals and at bedtime.

## 2023-07-12 NOTE — MAU Note (Signed)
Kayla [name] is a 32 y.o. at 104w6d here in MAU reporting: back pain that started last night that continues today. Pt rates it 9/10 and states it starts in her mid back more on the right side and radiates up to her right shoulder and arm. Pt states she also has lower abdominal pressure that began this morning. Pt reports SOB that started 2 days ago. Pt noticed it when she was laying down but it's worse with activity. Pt states she was sick earlier this week and has been congested and then these symptoms occurred. Pt denies LOF or VB. Pt reports yellow and white thick discharge with no odor. +FM   Onset of complaint: SOB: 2 days ago  Back pain last night  Lower abdominal pain this morning  Pain score: 9/10 back   Vitals:   07/12/23 1411  BP: 118/74  Pulse: (!) 114  Resp: 20  Temp: 98.8 F (37.1 C)  SpO2: 96%     FHT:148 Lab orders placed from triage:  UA

## 2023-07-17 ENCOUNTER — Telehealth: Payer: Self-pay

## 2023-07-17 NOTE — Telephone Encounter (Signed)
Left message for patient to call [facility] back.  Was able to get her scheduled for next Wednesday 9/4 at [facility] after her OB visit with them.  Need ultrasound slot her for [place].

## 2023-07-19 ENCOUNTER — Other Ambulatory Visit: Payer: Self-pay | Admitting: *Deleted

## 2023-07-19 ENCOUNTER — Ambulatory Visit: Payer: Medicare HMO | Admitting: *Deleted

## 2023-07-19 ENCOUNTER — Ambulatory Visit: Payer: Medicare HMO | Attending: Maternal & Fetal Medicine

## 2023-07-19 VITALS — BP 117/58 | HR 101

## 2023-07-19 DIAGNOSIS — O10912 Unspecified pre-existing hypertension complicating pregnancy, second trimester: Secondary | ICD-10-CM

## 2023-07-19 DIAGNOSIS — O99212 Obesity complicating pregnancy, second trimester: Secondary | ICD-10-CM | POA: Diagnosis not present

## 2023-07-19 DIAGNOSIS — O99513 Diseases of the respiratory system complicating pregnancy, third trimester: Secondary | ICD-10-CM

## 2023-07-19 DIAGNOSIS — O0990 Supervision of high risk pregnancy, unspecified, unspecified trimester: Secondary | ICD-10-CM | POA: Diagnosis present

## 2023-07-19 DIAGNOSIS — Z3A32 32 weeks gestation of pregnancy: Secondary | ICD-10-CM

## 2023-07-19 DIAGNOSIS — E669 Obesity, unspecified: Secondary | ICD-10-CM

## 2023-07-19 DIAGNOSIS — O10013 Pre-existing essential hypertension complicating pregnancy, third trimester: Secondary | ICD-10-CM | POA: Diagnosis not present

## 2023-07-19 DIAGNOSIS — O10913 Unspecified pre-existing hypertension complicating pregnancy, third trimester: Secondary | ICD-10-CM | POA: Insufficient documentation

## 2023-07-19 DIAGNOSIS — M329 Systemic lupus erythematosus, unspecified: Secondary | ICD-10-CM

## 2023-07-19 DIAGNOSIS — O09293 Supervision of pregnancy with other poor reproductive or obstetric history, third trimester: Secondary | ICD-10-CM

## 2023-07-19 DIAGNOSIS — O99213 Obesity complicating pregnancy, third trimester: Secondary | ICD-10-CM | POA: Diagnosis present

## 2023-07-19 DIAGNOSIS — J849 Interstitial pulmonary disease, unspecified: Secondary | ICD-10-CM

## 2023-07-19 DIAGNOSIS — O99891 Other specified diseases and conditions complicating pregnancy: Secondary | ICD-10-CM | POA: Diagnosis not present

## 2023-07-19 DIAGNOSIS — Z8759 Personal history of other complications of pregnancy, childbirth and the puerperium: Secondary | ICD-10-CM | POA: Diagnosis present

## 2023-07-25 ENCOUNTER — Other Ambulatory Visit: Payer: Self-pay

## 2023-07-25 DIAGNOSIS — I10 Essential (primary) hypertension: Secondary | ICD-10-CM

## 2023-07-26 ENCOUNTER — Ambulatory Visit (INDEPENDENT_AMBULATORY_CARE_PROVIDER_SITE_OTHER): Payer: Medicare HMO

## 2023-07-26 ENCOUNTER — Other Ambulatory Visit: Payer: Self-pay

## 2023-07-26 ENCOUNTER — Other Ambulatory Visit (HOSPITAL_COMMUNITY)
Admission: RE | Admit: 2023-07-26 | Discharge: 2023-07-26 | Disposition: A | Discharge status: Home / Self Care | Payer: Medicare HMO | Source: Ambulatory Visit | Attending: Obstetrics and Gynecology | Admitting: Obstetrics and Gynecology

## 2023-07-26 ENCOUNTER — Encounter: Payer: Self-pay | Admitting: Obstetrics and Gynecology

## 2023-07-26 ENCOUNTER — Ambulatory Visit: Payer: Medicare HMO

## 2023-07-26 ENCOUNTER — Ambulatory Visit (INDEPENDENT_AMBULATORY_CARE_PROVIDER_SITE_OTHER): Payer: Medicare HMO | Admitting: Obstetrics and Gynecology

## 2023-07-26 VITALS — BP 122/76 | HR 93 | Wt 189.8 lb

## 2023-07-26 DIAGNOSIS — Z3A33 33 weeks gestation of pregnancy: Secondary | ICD-10-CM

## 2023-07-26 DIAGNOSIS — O26893 Other specified pregnancy related conditions, third trimester: Secondary | ICD-10-CM | POA: Insufficient documentation

## 2023-07-26 DIAGNOSIS — Z7952 Long term (current) use of systemic steroids: Secondary | ICD-10-CM | POA: Insufficient documentation

## 2023-07-26 DIAGNOSIS — I10 Essential (primary) hypertension: Secondary | ICD-10-CM

## 2023-07-26 DIAGNOSIS — D696 Thrombocytopenia, unspecified: Secondary | ICD-10-CM | POA: Insufficient documentation

## 2023-07-26 DIAGNOSIS — O0990 Supervision of high risk pregnancy, unspecified, unspecified trimester: Secondary | ICD-10-CM

## 2023-07-26 DIAGNOSIS — N898 Other specified noninflammatory disorders of vagina: Secondary | ICD-10-CM | POA: Insufficient documentation

## 2023-07-26 DIAGNOSIS — M329 Systemic lupus erythematosus, unspecified: Secondary | ICD-10-CM

## 2023-07-26 DIAGNOSIS — O99213 Obesity complicating pregnancy, third trimester: Secondary | ICD-10-CM

## 2023-07-26 DIAGNOSIS — Z8759 Personal history of other complications of pregnancy, childbirth and the puerperium: Secondary | ICD-10-CM

## 2023-07-26 DIAGNOSIS — O99891 Other specified diseases and conditions complicating pregnancy: Secondary | ICD-10-CM

## 2023-07-26 DIAGNOSIS — O99119 Other diseases of the blood and blood-forming organs and certain disorders involving the immune mechanism complicating pregnancy, unspecified trimester: Secondary | ICD-10-CM

## 2023-07-26 DIAGNOSIS — J8489 Other specified interstitial pulmonary diseases: Secondary | ICD-10-CM

## 2023-07-26 DIAGNOSIS — Z6833 Body mass index (BMI) 33.0-33.9, adult: Secondary | ICD-10-CM

## 2023-07-26 NOTE — Progress Notes (Signed)
PRENATAL VISIT NOTE  Subjective:  Kayla [name] is a 32 y.o. [id number] at [redacted]w[redacted]d being seen today for ongoing prenatal care.  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  Patient reports vag discharge, irritation .  Contractions: Irritability. Vag. Bleeding: None.  Movement: Present. Denies leaking of fluid.   The following portions of the patient's history were reviewed and updated as appropriate: allergies, current medications, past family history, past medical history, past social history, past surgical history and problem list.   Objective:   Vitals:   07/26/23 1025  BP: 122/76  Pulse: 93  Weight: 189 lb 12.8 oz (86.1 kg)    Fetal Status: Fetal Heart Rate (bpm): 138   Movement: Present     General:  Alert, oriented and cooperative. Patient is in no acute distress.  Skin: Skin is warm and dry. No rash noted.   Cardiovascular: Normal heart rate noted  Respiratory: Normal respiratory effort, no problems with respiration noted  Abdomen: Soft, gravid, appropriate for gestational age.  Pain/Pressure: Present     Pelvic: Cervical exam deferred        Extremities: Normal range of motion.  Edema: None  Mental Status: Normal mood and affect. Normal behavior. Normal judgment and thought content.   Assessment and Plan:  Pregnancy: [id number] at [redacted]w[redacted]d 1. Low platelet count (HCC)    Latest Ref Rng & Units 07/12/2023    3:41 PM 06/22/2023    9:53 AM 05/03/2023    5:37 PM  CBC  WBC 4.0 - 10.5 K/uL 7.6  7.2  8.0   Hemoglobin 12.0 - 15.0 g/dL 21.3  08.6  57.8   Hematocrit 36.0 - 46.0 %  35.3  35.9  38.1   Platelets 150 - 400 K/uL PLATELET CLUMPS NOTED ON SMEAR, UNABLE TO ESTIMATE  132  PLATELET CLUMPS NOTED ON SMEAR, UNABLE TO ESTIMATE   - CBC - Pathologist smear review - Platelet count; Future  2. [redacted] weeks gestation of pregnancy BTL papers signed today after d/w her re: r/b; I d/w her re: 30 latency period 8/28: 55%, 2167g, ac 62%, 8/8, afi 16, ceph Has bpp later today. Continue with weekly testing - CBC - Pathologist smear review - Platelet count; Future  3. Systemic lupus erythematosus (SLE) affecting pregnancy, antepartum (HCC) Followed by [facility]. Confirms on imuran, plaquenil and prednisone 5mg  po qday S/p normal June 2024 maternal echo  4. 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 Chronic hypertension No issues  6. Obesity affecting pregnancy in third trimester, unspecified obesity type Weight stable  7. BMI 33.0-33.9,adult  8. Thrombocytopenia affecting pregnancy (HCC) See above  9. Supervision of high risk pregnancy, antepartum, RED CHART patient Message sent to [facility] re: delivery timing  10. History of IUFD Given this and SLE history, I sent a message to [facility] re: need for anti-coagulation  11. Long term steroid usage See above. I don't see any e/o that reason for prednisone is due to adrenal insufficiency. Given this, stress dose steroids likely not needed per ACOG: Are Peripartum Stress-Dose Steroids Necessary? Sylvester-Armstrong, Naida Sleight MD; Jacelyn Pi MD; Linna Darner MD, PhD Obstetrics & Gynecology 135(3):p [phone], March 2020.  Preterm labor symptoms and general obstetric precautions including but not limited to vaginal bleeding, contractions, leaking of fluid and fetal movement were reviewed in detail with the patient. Please refer to After Visit Summary for other counseling recommendations.   No follow-ups on file.  Future Appointments  Date Time Provider Department Center  07/26/2023  11:15 AM [facility] US2 [facility]  08/01/2023 10:15 AM [facility] NURSE [facility]  08/01/2023 10:30 AM [facility] US2 [facility]  08/08/2023  2:15 PM [facility] NURSE [facility]  08/08/2023  2:30 PM [facility] US6 [facility]  08/14/2023 11:15 AM Mat[place] Bing, MD [facility]  08/16/2023  1:15 PM [facility] NURSE [facility]  08/16/2023  1:30 PM [facility] US1 [facility]  08/24/2023 11:00 AM Tobb, Lavona Mound, DO [facility] None    Salem Bing, MD

## 2023-07-27 LAB — CBC
Hematocrit: 36 % (ref 34.0–46.6)
Hemoglobin: 12 g/dL (ref 11.1–15.9)
MCH: 28 pg (ref 26.6–33.0)
MCHC: 33.3 g/dL (ref 31.5–35.7)
MCV: 84 fL (ref 79–97)
RBC: 4.29 x10E6/uL (ref 3.77–5.28)
RDW: 12.1 % (ref 11.7–15.4)
WBC: 9 10*3/uL (ref 3.4–10.8)

## 2023-07-27 LAB — CERVICOVAGINAL ANCILLARY ONLY
Bacterial Vaginitis (gardnerella): NEGATIVE
Candida Glabrata: NEGATIVE
Candida Vaginitis: NEGATIVE
Chlamydia: NEGATIVE
Comment: NEGATIVE
Comment: NEGATIVE
Comment: NEGATIVE
Comment: NEGATIVE
Comment: NEGATIVE
Comment: NORMAL
Neisseria Gonorrhea: NEGATIVE
Trichomonas: NEGATIVE

## 2023-07-28 ENCOUNTER — Encounter: Payer: Self-pay | Admitting: Obstetrics and Gynecology

## 2023-07-28 LAB — STREP GP B NAA: Strep Gp B NAA: NEGATIVE

## 2023-07-29 ENCOUNTER — Telehealth: Payer: Self-pay | Admitting: Obstetrics & Gynecology

## 2023-07-29 DIAGNOSIS — O26899 Other specified pregnancy related conditions, unspecified trimester: Secondary | ICD-10-CM

## 2023-07-29 MED ORDER — ONDANSETRON 4 MG PO TBDP
4.0000 mg | ORAL_TABLET | Freq: Four times a day (QID) | ORAL | 1 refills | Status: DC | PRN
Start: 2023-07-29 — End: 2024-01-16

## 2023-07-29 NOTE — Telephone Encounter (Signed)
     Faculty Practice OB/GYN Physician Phone Call Documentation  I received a phone call from our after hours nursing answering service about Kayla [name] requesting Zofran ODT for nausea.  She had been prescribed the normal tablets instead of the ODT form, she wants the ODT form sent in. This was prescribed, patient informed.  She will contact us for any worsening or uncontrolled symptoms.   Jaynie Collins, MD, FACOG Obstetrician & Gynecologist, [facility] for [employer], [facility]

## 2023-08-01 ENCOUNTER — Other Ambulatory Visit: Payer: Self-pay

## 2023-08-01 ENCOUNTER — Ambulatory Visit: Payer: Medicare HMO | Attending: Obstetrics

## 2023-08-01 ENCOUNTER — Other Ambulatory Visit: Payer: Self-pay | Admitting: *Deleted

## 2023-08-01 ENCOUNTER — Ambulatory Visit: Payer: Medicare HMO | Admitting: *Deleted

## 2023-08-01 VITALS — BP 111/61 | HR 98

## 2023-08-01 DIAGNOSIS — M329 Systemic lupus erythematosus, unspecified: Secondary | ICD-10-CM | POA: Diagnosis present

## 2023-08-01 DIAGNOSIS — O10913 Unspecified pre-existing hypertension complicating pregnancy, third trimester: Secondary | ICD-10-CM | POA: Diagnosis present

## 2023-08-01 DIAGNOSIS — O09293 Supervision of pregnancy with other poor reproductive or obstetric history, third trimester: Secondary | ICD-10-CM

## 2023-08-01 DIAGNOSIS — O10013 Pre-existing essential hypertension complicating pregnancy, third trimester: Secondary | ICD-10-CM

## 2023-08-01 DIAGNOSIS — Z8759 Personal history of other complications of pregnancy, childbirth and the puerperium: Secondary | ICD-10-CM | POA: Insufficient documentation

## 2023-08-01 DIAGNOSIS — Z3A34 34 weeks gestation of pregnancy: Secondary | ICD-10-CM

## 2023-08-01 DIAGNOSIS — E669 Obesity, unspecified: Secondary | ICD-10-CM

## 2023-08-01 DIAGNOSIS — O99213 Obesity complicating pregnancy, third trimester: Secondary | ICD-10-CM | POA: Diagnosis not present

## 2023-08-01 DIAGNOSIS — O99893 Other specified diseases and conditions complicating puerperium: Secondary | ICD-10-CM

## 2023-08-01 DIAGNOSIS — O99513 Diseases of the respiratory system complicating pregnancy, third trimester: Secondary | ICD-10-CM

## 2023-08-01 DIAGNOSIS — O0990 Supervision of high risk pregnancy, unspecified, unspecified trimester: Secondary | ICD-10-CM

## 2023-08-01 DIAGNOSIS — D696 Thrombocytopenia, unspecified: Secondary | ICD-10-CM

## 2023-08-01 DIAGNOSIS — J849 Interstitial pulmonary disease, unspecified: Secondary | ICD-10-CM

## 2023-08-02 ENCOUNTER — Ambulatory Visit: Payer: Medicare HMO | Admitting: Nurse Practitioner

## 2023-08-03 ENCOUNTER — Other Ambulatory Visit: Payer: Self-pay

## 2023-08-03 ENCOUNTER — Inpatient Hospital Stay (HOSPITAL_COMMUNITY)
Admission: AD | Admit: 2023-08-03 | Discharge: 2023-08-03 | Disposition: A | Discharge status: Home / Self Care | Payer: Medicare HMO | Attending: Obstetrics and Gynecology | Admitting: Obstetrics and Gynecology

## 2023-08-03 ENCOUNTER — Ambulatory Visit (INDEPENDENT_AMBULATORY_CARE_PROVIDER_SITE_OTHER): Payer: Medicare HMO

## 2023-08-03 DIAGNOSIS — O0993 Supervision of high risk pregnancy, unspecified, third trimester: Secondary | ICD-10-CM | POA: Insufficient documentation

## 2023-08-03 DIAGNOSIS — O98813 Other maternal infectious and parasitic diseases complicating pregnancy, third trimester: Secondary | ICD-10-CM | POA: Insufficient documentation

## 2023-08-03 DIAGNOSIS — N898 Other specified noninflammatory disorders of vagina: Secondary | ICD-10-CM | POA: Diagnosis not present

## 2023-08-03 DIAGNOSIS — Z3A35 35 weeks gestation of pregnancy: Secondary | ICD-10-CM | POA: Diagnosis not present

## 2023-08-03 DIAGNOSIS — B3731 Acute candidiasis of vulva and vagina: Secondary | ICD-10-CM | POA: Diagnosis present

## 2023-08-03 DIAGNOSIS — L292 Pruritus vulvae: Secondary | ICD-10-CM | POA: Insufficient documentation

## 2023-08-03 DIAGNOSIS — I10 Essential (primary) hypertension: Secondary | ICD-10-CM

## 2023-08-03 DIAGNOSIS — Z3A34 34 weeks gestation of pregnancy: Secondary | ICD-10-CM

## 2023-08-03 DIAGNOSIS — O0990 Supervision of high risk pregnancy, unspecified, unspecified trimester: Secondary | ICD-10-CM

## 2023-08-03 LAB — WET PREP, GENITAL
Clue Cells Wet Prep HPF POC: NONE SEEN
Sperm: NONE SEEN
Trich, Wet Prep: NONE SEEN
WBC, Wet Prep HPF POC: <10 (ref <10)
Yeast Wet Prep HPF POC: NONE SEEN

## 2023-08-03 MED ORDER — TERCONAZOLE 0.4 % VA CREA
1.0000 | TOPICAL_CREAM | Freq: Every day | VAGINAL | 0 refills | Status: DC
Start: 2023-08-03 — End: 2023-08-15

## 2023-08-03 MED ORDER — NYSTATIN-TRIAMCINOLONE 100000-0.1 UNIT/GM-% EX CREA: TOPICAL_CREAM | CUTANEOUS | 0 refills | Status: DC

## 2023-08-03 NOTE — MAU Note (Signed)
Kayla [name] is a 32 y.o. at [redacted]w[redacted]d here in MAU reporting: "I think I have a yeast infection."  Reports vaginal irritation, itching and white thick discharge.  Reports was tested last week, results negative, but had a yeast infection the week before last.   Denies LOF and VB, endorses spotting with wiping.  States "I can tell spotting is coming from the outside, it's not the inside."  Endorses +FM. LMP: NA  Onset of complaint: last week  Pain score: 0 Vitals:   08/03/23 0813  BP: 119/66  Pulse: 87  Resp: 19  Temp: 98.3 F (36.8 C)  SpO2: 99%     FHT:135 bpm Lab orders placed from triage:   None

## 2023-08-03 NOTE — MAU Provider Note (Signed)
History     CSN: [id number]  Arrival date and time: 08/03/23 0750   Event Date/Time   First Provider Initiated Contact with Patient 08/03/23 0915      Chief Complaint  Patient presents with   Vaginal Itching   Vaginal Irritation   Kayla [name] , a  32 y.o. [id number] at [redacted]w[redacted]d presents to MAU with complaints of vaginal itching and spotting. Patient states she was treated for a yeast infection a few weeks ago. She states that she feels like its not fully gone away. She reports continued thick white chunky discharge and vaginal itching. She states that she has been using Nystatin cream to help with itching but its unrelieved. She reports that she also having some spotting but she reports that its on the outside and not coming from the inside the vagina. She states that the spotting is coming from where she has scratched herself so much. She denies overt vaginal bleeding, leaking of fluid or contractions. She endorses positive fetal movement. She has no pregnancy related complaints.          OB History     Gravida  5   Para  2   Term  1   Preterm  1   AB  2   Living  1      SAB  2   IAB  0   Ectopic  0   Multiple  0   Live Births  1           Past Medical History:  Diagnosis Date   Acute respiratory failure with hypoxemia (HCC) 09/19/2017   Acute respiratory failure with hypoxia (HCC)    Arthritis    "hands and legs" (01/08/2015)   CAP (community acquired pneumonia) 01/07/2015   Chronic Respiratory failure with oxygen requirement affecting pregnancy, antepartum 05/16/2016   Resolved pulmonary HTN on 2018 echo (in our epic).   Pt off O2 in 2018.   Daily headache    "sometimes" (01/08/2015)   Gastroesophageal reflux disease without esophagitis 07/13/2020   GERD (gastroesophageal reflux disease)    Hypertension    No longer takes meds   ILD (interstitial lung disease) (HCC)    Insomnia 08/21/2015   Loud P2 (pulmonary S2, second heart sound) 08/21/2015    Lung disease    Lupus (HCC)    Lupus (systemic lupus erythematosus) (HCC) 08/21/2015   Multifocal pneumonia    Pulmonary hypertension (HCC)    Seasonal allergies 03/02/2023   Sjogren's syndrome (HCC)    SS-A antibody positive 06/02/2016   SS-B antibody positive 06/02/2016   STD (sexually transmitted disease)    Chlamydia   Viral illness 03/02/2023    Past Surgical History:  Procedure Laterality Date   CARDIAC CATHETERIZATION N/A 09/09/2015   Procedure: Right Heart Cath;  Surgeon: Laurey Morale, MD;  Location: [facility] INVASIVE CV LAB;  Service: Cardiovascular;  Laterality: N/A;   DILATION AND EVACUATION N/A 07/13/2015   Procedure: DILATATION AND EVACUATION;  Surgeon: Levie Heritage, DO;  Location: [facility] ORS;  Service: Gynecology;  Laterality: N/A;   FINGER SURGERY Right 03/2014   "laceration, nerve/artery injury" 2nd digit   VIDEO BRONCHOSCOPY Bilateral 01/12/2015   Procedure: VIDEO BRONCHOSCOPY WITH FLUORO;  Surgeon: Leslye Peer, MD;  Location: [facility] ENDOSCOPY;  Service: Cardiopulmonary;  Laterality: Bilateral;    Family History  Problem Relation Age of Onset   Diabetes Mother    Arthritis Father    Cancer Brother        Found  in jaw area   Diabetes Maternal Aunt    Diabetes Maternal Grandmother     Social History   Tobacco Use   Smoking status: Former    Current packs/day: 0.00    Average packs/day: 0.1 packs/day for 5.0 years (0.5 ttl pk-yrs)    Types: Cigarettes    Start date: 11/20/2009    Quit date: 11/20/2014    Years since quitting: 8.7   Smokeless tobacco: Never  Vaping Use   Vaping status: Never Used  Substance Use Topics   Alcohol use: Not Currently    Comment: Occas   Drug use: No    Allergies:  Allergies  Allergen Reactions   Hydrocodone Nausea And Vomiting   Zithromax [Azithromycin] Itching and Cough    Medications Prior to Admission  Medication Sig Dispense Refill Last Dose   acetaminophen (TYLENOL) 500 MG tablet Take 1,000 mg by mouth every 6  (six) hours as needed.      acetaminophen-caffeine (EXCEDRIN TENSION HEADACHE) 500-65 MG TABS per tablet Take 2 tablets by mouth every 6 (six) hours as needed (headache). (Patient not taking: Reported on 07/26/2023) 60 tablet 0    albuterol (VENTOLIN HFA) 108 (90 Base) MCG/ACT inhaler Inhale 2 puffs into the lungs every 6 (six) hours as needed for wheezing or shortness of breath. 8 g 2    aspirin EC 81 MG tablet Take 1 tablet (81 mg total) by mouth daily. Swallow whole. 30 tablet 12    azaTHIOprine (IMURAN) 50 MG tablet Take 50 mg by mouth daily.      cyclobenzaprine (FLEXERIL) 10 MG tablet Take 1 tablet (10 mg total) by mouth 2 (two) times daily as needed for muscle spasms. 20 tablet 0    hydroxychloroquine (PLAQUENIL) 200 MG tablet Take 200 mg by mouth 2 (two) times daily.      ipratropium (ATROVENT HFA) 17 MCG/ACT inhaler Inhale 2 puffs into the lungs every 6 (six) hours as needed for wheezing. 1 each 12    ondansetron (ZOFRAN-ODT) 4 MG disintegrating tablet Take 1 tablet (4 mg total) by mouth every 6 (six) hours as needed for nausea. 20 tablet 1    pantoprazole (PROTONIX) 40 MG tablet Take 1 tablet (40 mg total) by mouth 2 (two) times daily before a meal. 60 tablet 5    predniSONE (DELTASONE) 5 MG tablet Take 5 mg by mouth daily with breakfast.      Prenatal 28-0.8 MG TABS Take 1 tablet by mouth daily. 30 tablet 12     Review of Systems  Constitutional:  Negative for chills, fatigue and fever.  Eyes:  Negative for pain and visual disturbance.  Respiratory:  Negative for apnea, shortness of breath and wheezing.   Cardiovascular:  Negative for chest pain and palpitations.  Gastrointestinal:  Negative for abdominal pain, constipation, diarrhea, nausea and vomiting.  Genitourinary:  Positive for vaginal discharge and vaginal pain. Negative for difficulty urinating, dysuria, pelvic pain and vaginal bleeding.  Musculoskeletal:  Negative for back pain.  Neurological:  Negative for seizures, weakness  and headaches.  Psychiatric/Behavioral:  Negative for suicidal ideas.    Physical Exam   Blood pressure 119/66, pulse 87, temperature 98.3 F (36.8 C), temperature source Oral, resp. rate 19, height 5\' 3"  (1.6 m), weight 87.5 kg, last menstrual period 12/05/2022, SpO2 99%.  Physical Exam Vitals and nursing note reviewed.  Constitutional:      General: She is not in acute distress.    Appearance: Normal appearance.  HENT:  Head: Normocephalic.  Pulmonary:     Effort: Pulmonary effort is normal.  Genitourinary:    Comments: CNM observed vaginal swabs prior to being sent and considerable amount of thick white discharge noted.  Musculoskeletal:     Cervical back: Normal range of motion.  Skin:    General: Skin is warm and dry.  Neurological:     Mental Status: She is alert and oriented to person, place, and time.  Psychiatric:        Mood and Affect: Mood normal.    FHT obtained in triage.  MAU Course  Procedures Orders Placed This Encounter  Procedures   Wet prep, genital   Discharge patient   Results for orders placed or performed during the hospital encounter of 08/03/23 (from the past 24 hour(s))  Wet prep, genital     Status: None   Collection Time: 08/03/23  8:27 AM   Specimen: PATH Cytology Cervicovaginal Ancillary Only  Result Value Ref Range   Yeast Wet Prep HPF POC NONE SEEN NONE SEEN   Trich, Wet Prep NONE SEEN NONE SEEN   Clue Cells Wet Prep HPF POC NONE SEEN NONE SEEN   WBC, Wet Prep HPF POC <10 <10   Sperm NONE SEEN     MDM - GC pending upon discharge.  - Wet prep negative, however given vaginal discharge description and continuous itching plan to treat.  - Plan for discharge  Assessment and Plan   1. Supervision of high risk pregnancy, antepartum, RED CHART patient   2. Vaginal discharge   3. [redacted] weeks gestation of pregnancy   4. Vaginal itching    - Reviewed worsening signs and return precautions.  - Rx for Mycolog II and Terazole 7 sent to  outpatient pharmacy.  -  Admin instructions provided at bedside.  - Patient has appointment for Tuesday. Reviewed that if no improvement by appt, may need to alter medication or repeat dosing.  - Patient discharged home in stable condition and may return to MAU as needed.   Claudette Head, MSN CNM  08/03/2023, 9:15 AM

## 2023-08-04 LAB — GC/CHLAMYDIA PROBE AMP (~~LOC~~) NOT AT ARMC
Chlamydia: NEGATIVE
Comment: NEGATIVE
Comment: NORMAL
Neisseria Gonorrhea: NEGATIVE

## 2023-08-05 ENCOUNTER — Other Ambulatory Visit: Payer: Self-pay | Admitting: Obstetrics and Gynecology

## 2023-08-05 DIAGNOSIS — O0990 Supervision of high risk pregnancy, unspecified, unspecified trimester: Secondary | ICD-10-CM

## 2023-08-05 NOTE — Progress Notes (Signed)
OB Note Per MFM, 37wk IOL delivery recommended. L&D and patient called and she was set up for 9/26 at 2345 IOL

## 2023-08-08 ENCOUNTER — Ambulatory Visit: Payer: Medicare HMO

## 2023-08-08 ENCOUNTER — Ambulatory Visit: Payer: Medicare HMO | Attending: Obstetrics and Gynecology

## 2023-08-08 DIAGNOSIS — O99891 Other specified diseases and conditions complicating pregnancy: Secondary | ICD-10-CM | POA: Diagnosis not present

## 2023-08-08 DIAGNOSIS — E669 Obesity, unspecified: Secondary | ICD-10-CM

## 2023-08-08 DIAGNOSIS — O10912 Unspecified pre-existing hypertension complicating pregnancy, second trimester: Secondary | ICD-10-CM | POA: Insufficient documentation

## 2023-08-08 DIAGNOSIS — O10013 Pre-existing essential hypertension complicating pregnancy, third trimester: Secondary | ICD-10-CM

## 2023-08-08 DIAGNOSIS — O99213 Obesity complicating pregnancy, third trimester: Secondary | ICD-10-CM | POA: Diagnosis not present

## 2023-08-08 DIAGNOSIS — Z3A35 35 weeks gestation of pregnancy: Secondary | ICD-10-CM

## 2023-08-08 DIAGNOSIS — M329 Systemic lupus erythematosus, unspecified: Secondary | ICD-10-CM

## 2023-08-11 ENCOUNTER — Ambulatory Visit: Payer: Medicare HMO | Attending: Maternal & Fetal Medicine | Admitting: *Deleted

## 2023-08-11 ENCOUNTER — Ambulatory Visit: Payer: Medicare HMO

## 2023-08-11 VITALS — BP 114/69 | HR 78

## 2023-08-11 DIAGNOSIS — O10013 Pre-existing essential hypertension complicating pregnancy, third trimester: Secondary | ICD-10-CM

## 2023-08-11 DIAGNOSIS — Z3A36 36 weeks gestation of pregnancy: Secondary | ICD-10-CM | POA: Insufficient documentation

## 2023-08-11 DIAGNOSIS — O10913 Unspecified pre-existing hypertension complicating pregnancy, third trimester: Secondary | ICD-10-CM | POA: Diagnosis present

## 2023-08-11 DIAGNOSIS — O0990 Supervision of high risk pregnancy, unspecified, unspecified trimester: Secondary | ICD-10-CM

## 2023-08-11 NOTE — Procedures (Signed)
Kayla [name] [birth year] [redacted]w[redacted]d  Fetus A Non-Stress Test Interpretation for 08/11/23  NST only  Indication: Chronic Hypertenstion  Fetal Heart Rate A Mode: External Baseline Rate (A): 140 bpm Variability: Moderate Accelerations: 15 x 15 Decelerations: Variable Multiple birth?: No  Uterine Activity Mode: Palpation, Toco Contraction Frequency (min): 1 uc Contraction Duration (sec): 60 Contraction Quality: Mild Resting Tone Palpated: Relaxed Resting Time: Adequate  Interpretation (Fetal Testing) Nonstress Test Interpretation: Reactive Overall Impression: Reassuring for gestational age Comments: Dr. Darra Lis reviewed tracing

## 2023-08-14 ENCOUNTER — Other Ambulatory Visit (HOSPITAL_COMMUNITY)
Admission: RE | Admit: 2023-08-14 | Discharge: 2023-08-14 | Disposition: A | Discharge status: Home / Self Care | Payer: Medicare HMO | Source: Ambulatory Visit | Attending: Obstetrics and Gynecology | Admitting: Obstetrics and Gynecology

## 2023-08-14 ENCOUNTER — Ambulatory Visit (INDEPENDENT_AMBULATORY_CARE_PROVIDER_SITE_OTHER): Payer: Medicare HMO | Admitting: Obstetrics and Gynecology

## 2023-08-14 ENCOUNTER — Telehealth (HOSPITAL_COMMUNITY): Payer: Self-pay | Admitting: *Deleted

## 2023-08-14 ENCOUNTER — Encounter (HOSPITAL_COMMUNITY): Payer: Self-pay | Admitting: *Deleted

## 2023-08-14 ENCOUNTER — Other Ambulatory Visit: Payer: Self-pay

## 2023-08-14 VITALS — BP 94/68 | HR 110 | Wt 194.6 lb

## 2023-08-14 DIAGNOSIS — O0990 Supervision of high risk pregnancy, unspecified, unspecified trimester: Secondary | ICD-10-CM | POA: Diagnosis present

## 2023-08-14 DIAGNOSIS — D696 Thrombocytopenia, unspecified: Secondary | ICD-10-CM

## 2023-08-14 DIAGNOSIS — Z3A36 36 weeks gestation of pregnancy: Secondary | ICD-10-CM

## 2023-08-14 DIAGNOSIS — O99119 Other diseases of the blood and blood-forming organs and certain disorders involving the immune mechanism complicating pregnancy, unspecified trimester: Secondary | ICD-10-CM

## 2023-08-14 DIAGNOSIS — O99891 Other specified diseases and conditions complicating pregnancy: Secondary | ICD-10-CM

## 2023-08-14 DIAGNOSIS — M329 Systemic lupus erythematosus, unspecified: Secondary | ICD-10-CM

## 2023-08-14 DIAGNOSIS — I10 Essential (primary) hypertension: Secondary | ICD-10-CM

## 2023-08-14 DIAGNOSIS — Z8759 Personal history of other complications of pregnancy, childbirth and the puerperium: Secondary | ICD-10-CM

## 2023-08-14 NOTE — Progress Notes (Signed)
PRENATAL VISIT NOTE  Subjective:  Kayla [name] is a 32 y.o. [id number] at [redacted]w[redacted]d being seen today for ongoing prenatal care.  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  Patient reports no complaints.  Contractions: Irritability. Vag. Bleeding: None.  Movement: Present. Denies leaking of fluid.   The following portions of the patient's history were reviewed and updated as appropriate: allergies, current medications, past family history, past medical history, past social history, past surgical history and problem list.   Objective:   Vitals:   08/14/23 1127  BP: 94/68  Pulse: (!) 110  Weight: 194 lb 9.6 oz (88.3 kg)    Fetal Status: Fetal Heart Rate (bpm): 140   Movement: Present     General:  Alert, oriented and cooperative. Patient is in no acute distress.  Skin: Skin is warm and dry. No rash noted.   Cardiovascular: Normal heart rate noted  Respiratory: Normal respiratory effort, no problems with respiration noted  Abdomen: Soft, gravid, appropriate for gestational age.  Pain/Pressure: Present     Pelvic: Cervical exam deferred        Extremities: Normal range of motion.  Edema: Trace  Mental Status: Normal mood and affect. Normal behavior. Normal judgment and thought content.   Assessment and Plan:  Pregnancy: [name] at [redacted]w[redacted]d 1. Low platelet count? D/w anesthesia and recommend TEG level on L&D admit    Latest Ref Rng & Units 07/26/2023   11:20 AM 07/12/2023    3:41 PM 06/22/2023    9:53 AM  CBC  WBC 3.4 - 10.8 x10E3/uL 9.0   7.6  7.2   Hemoglobin 11.1 - 15.9 g/dL 19.1  47.8  29.5   Hematocrit 34.0 - 46.6 % 36.0  35.3  35.9   Platelets x10E3/uL CANCELED  PLATELET CLUMPS NOTED ON SMEAR, UNABLE TO ESTIMATE  132     2. [redacted] weeks gestation of pregnancy Has 9/27 at 2345 IOL already scheduled. BTL papers signed 9/4; pt aware unable to do BTL unless needs a c/s. Continue weekly testing 9/12: 8/8, cephalic afi 18.7 8/28: 55%, 2167g, ac 62%, 8/8, afi 16, ceph  - GC/Chlamydia probe amp ([place])not at [facility] - Culture, beta strep (group b only)   3. Systemic lupus erythematosus (SLE) affecting pregnancy, antepartum (HCC) Followed by [facility]. Confirms on imuran, plaquenil and prednisone 5mg  po qday S/p normal June 2024 maternal echo   4. 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 Chronic hypertension No issues   6. Obesity affecting pregnancy in third trimester, unspecified obesity type Weight stable   7. BMI 33.0-33.9,adult   8. Thrombocytopenia affecting pregnancy (HCC) See above   9. Supervision of high risk pregnancy, antepartum, RED CHART patient Message sent to mfm re: delivery timing   10. History of IUFD Given this and SLE history, I sent a message to mfm re: need for anti-coagulation   11. Long term steroid usage See above. I don't see any e/o that reason for prednisone is due to adrenal insufficiency. Given this, stress dose steroids likely not needed per ACOG: Are Peripartum Stress-Dose Steroids Necessary? Sylvester-Armstrong, Naida Sleight MD; Cristela Felt,  Luisa Hart MD; Linna Darner MD, PhD Obstetrics & Gynecology 135(3):p [phone], March 2020.  Preterm labor symptoms and general obstetric precautions including but not limited to vaginal bleeding, contractions, leaking of fluid and fetal movement were reviewed in detail with the patient. Please refer to After Visit Summary for other counseling recommendations.   Return in about 2 weeks (around 08/28/2023) for  or 10/8, PP BP check .  Future Appointments  Date Time Provider Department Center  08/15/2023  9:15 AM [facility] NURSE [facility]  08/15/2023  9:30 AM [facility] US5 [facility]  08/18/2023 12:00 AM [facility] SCHED ROOM [facility] None  08/18/2023  8:30 AM [facility] NURSE [facility]  08/18/2023  8:45 AM [facility] NST [facility]  08/24/2023 11:00 AM Tobb, Kardie, DO [facility] None    [place], MD

## 2023-08-14 NOTE — Telephone Encounter (Signed)
Preadmission screen  

## 2023-08-15 ENCOUNTER — Ambulatory Visit: Payer: Medicare HMO | Admitting: *Deleted

## 2023-08-15 ENCOUNTER — Ambulatory Visit (HOSPITAL_BASED_OUTPATIENT_CLINIC_OR_DEPARTMENT_OTHER): Payer: Medicare HMO

## 2023-08-15 VITALS — BP 110/66 | HR 86

## 2023-08-15 DIAGNOSIS — O10912 Unspecified pre-existing hypertension complicating pregnancy, second trimester: Secondary | ICD-10-CM | POA: Insufficient documentation

## 2023-08-15 DIAGNOSIS — O99891 Other specified diseases and conditions complicating pregnancy: Secondary | ICD-10-CM

## 2023-08-15 DIAGNOSIS — M329 Systemic lupus erythematosus, unspecified: Secondary | ICD-10-CM

## 2023-08-15 DIAGNOSIS — O0990 Supervision of high risk pregnancy, unspecified, unspecified trimester: Secondary | ICD-10-CM | POA: Insufficient documentation

## 2023-08-15 DIAGNOSIS — O99213 Obesity complicating pregnancy, third trimester: Secondary | ICD-10-CM

## 2023-08-15 DIAGNOSIS — E669 Obesity, unspecified: Secondary | ICD-10-CM

## 2023-08-15 DIAGNOSIS — O1092 Unspecified pre-existing hypertension complicating childbirth: Secondary | ICD-10-CM | POA: Diagnosis not present

## 2023-08-15 DIAGNOSIS — O10013 Pre-existing essential hypertension complicating pregnancy, third trimester: Secondary | ICD-10-CM

## 2023-08-15 DIAGNOSIS — R0789 Other chest pain: Secondary | ICD-10-CM | POA: Diagnosis not present

## 2023-08-15 DIAGNOSIS — O09293 Supervision of pregnancy with other poor reproductive or obstetric history, third trimester: Secondary | ICD-10-CM

## 2023-08-15 DIAGNOSIS — Z3A36 36 weeks gestation of pregnancy: Secondary | ICD-10-CM

## 2023-08-16 ENCOUNTER — Ambulatory Visit: Payer: Medicare HMO

## 2023-08-16 LAB — GC/CHLAMYDIA PROBE AMP (~~LOC~~) NOT AT ARMC
Chlamydia: NEGATIVE
Comment: NEGATIVE
Comment: NORMAL
Neisseria Gonorrhea: NEGATIVE

## 2023-08-18 ENCOUNTER — Inpatient Hospital Stay (HOSPITAL_COMMUNITY): Payer: Medicare HMO

## 2023-08-18 ENCOUNTER — Inpatient Hospital Stay (HOSPITAL_COMMUNITY)
Admission: AD | Admit: 2023-08-18 | Discharge: 2023-08-22 | DRG: 806 | Disposition: A | Discharge status: Home / Self Care | Payer: Medicare HMO | Attending: Obstetrics and Gynecology | Admitting: Obstetrics and Gynecology

## 2023-08-18 ENCOUNTER — Encounter (HOSPITAL_COMMUNITY): Payer: Self-pay | Admitting: Obstetrics and Gynecology

## 2023-08-18 ENCOUNTER — Ambulatory Visit: Payer: Medicare HMO | Admitting: *Deleted

## 2023-08-18 ENCOUNTER — Inpatient Hospital Stay (HOSPITAL_COMMUNITY): Admission: RE | Admit: 2023-08-18 | Payer: Medicare HMO | Source: Home / Self Care | Admitting: Obstetrics & Gynecology

## 2023-08-18 ENCOUNTER — Other Ambulatory Visit: Payer: Self-pay

## 2023-08-18 VITALS — BP 129/65 | HR 74

## 2023-08-18 DIAGNOSIS — Z87891 Personal history of nicotine dependence: Secondary | ICD-10-CM

## 2023-08-18 DIAGNOSIS — Z77028 Contact with and (suspected) exposure to other hazardous aromatic compounds: Secondary | ICD-10-CM | POA: Diagnosis present

## 2023-08-18 DIAGNOSIS — O0990 Supervision of high risk pregnancy, unspecified, unspecified trimester: Secondary | ICD-10-CM

## 2023-08-18 DIAGNOSIS — O1092 Unspecified pre-existing hypertension complicating childbirth: Principal | ICD-10-CM | POA: Diagnosis present

## 2023-08-18 DIAGNOSIS — O99119 Other diseases of the blood and blood-forming organs and certain disorders involving the immune mechanism complicating pregnancy, unspecified trimester: Secondary | ICD-10-CM | POA: Diagnosis present

## 2023-08-18 DIAGNOSIS — O9912 Other diseases of the blood and blood-forming organs and certain disorders involving the immune mechanism complicating childbirth: Secondary | ICD-10-CM | POA: Diagnosis present

## 2023-08-18 DIAGNOSIS — O9952 Diseases of the respiratory system complicating childbirth: Secondary | ICD-10-CM | POA: Diagnosis present

## 2023-08-18 DIAGNOSIS — Z833 Family history of diabetes mellitus: Secondary | ICD-10-CM | POA: Diagnosis not present

## 2023-08-18 DIAGNOSIS — O99892 Other specified diseases and conditions complicating childbirth: Secondary | ICD-10-CM | POA: Diagnosis present

## 2023-08-18 DIAGNOSIS — O10913 Unspecified pre-existing hypertension complicating pregnancy, third trimester: Secondary | ICD-10-CM | POA: Insufficient documentation

## 2023-08-18 DIAGNOSIS — Z8261 Family history of arthritis: Secondary | ICD-10-CM | POA: Diagnosis not present

## 2023-08-18 DIAGNOSIS — Z8249 Family history of ischemic heart disease and other diseases of the circulatory system: Secondary | ICD-10-CM | POA: Diagnosis not present

## 2023-08-18 DIAGNOSIS — Z8701 Personal history of pneumonia (recurrent): Secondary | ICD-10-CM | POA: Diagnosis not present

## 2023-08-18 DIAGNOSIS — D696 Thrombocytopenia, unspecified: Secondary | ICD-10-CM | POA: Insufficient documentation

## 2023-08-18 DIAGNOSIS — Z3A37 37 weeks gestation of pregnancy: Secondary | ICD-10-CM

## 2023-08-18 DIAGNOSIS — D563 Thalassemia minor: Secondary | ICD-10-CM | POA: Diagnosis present

## 2023-08-18 DIAGNOSIS — O99113 Other diseases of the blood and blood-forming organs and certain disorders involving the immune mechanism complicating pregnancy, third trimester: Secondary | ICD-10-CM | POA: Insufficient documentation

## 2023-08-18 DIAGNOSIS — Z23 Encounter for immunization: Secondary | ICD-10-CM | POA: Diagnosis present

## 2023-08-18 DIAGNOSIS — O09293 Supervision of pregnancy with other poor reproductive or obstetric history, third trimester: Secondary | ICD-10-CM | POA: Insufficient documentation

## 2023-08-18 DIAGNOSIS — M329 Systemic lupus erythematosus, unspecified: Secondary | ICD-10-CM | POA: Diagnosis present

## 2023-08-18 DIAGNOSIS — I10 Essential (primary) hypertension: Secondary | ICD-10-CM | POA: Diagnosis present

## 2023-08-18 DIAGNOSIS — Z7982 Long term (current) use of aspirin: Secondary | ICD-10-CM

## 2023-08-18 DIAGNOSIS — R0789 Other chest pain: Secondary | ICD-10-CM | POA: Diagnosis present

## 2023-08-18 DIAGNOSIS — Z148 Genetic carrier of other disease: Secondary | ICD-10-CM

## 2023-08-18 DIAGNOSIS — O99213 Obesity complicating pregnancy, third trimester: Secondary | ICD-10-CM | POA: Insufficient documentation

## 2023-08-18 DIAGNOSIS — M069 Rheumatoid arthritis, unspecified: Secondary | ICD-10-CM | POA: Diagnosis present

## 2023-08-18 DIAGNOSIS — K219 Gastro-esophageal reflux disease without esophagitis: Secondary | ICD-10-CM | POA: Diagnosis present

## 2023-08-18 DIAGNOSIS — O10013 Pre-existing essential hypertension complicating pregnancy, third trimester: Secondary | ICD-10-CM

## 2023-08-18 DIAGNOSIS — Z823 Family history of stroke: Secondary | ICD-10-CM | POA: Diagnosis not present

## 2023-08-18 DIAGNOSIS — Z7952 Long term (current) use of systemic steroids: Secondary | ICD-10-CM

## 2023-08-18 DIAGNOSIS — Z8759 Personal history of other complications of pregnancy, childbirth and the puerperium: Secondary | ICD-10-CM

## 2023-08-18 DIAGNOSIS — M3500 Sicca syndrome, unspecified: Secondary | ICD-10-CM | POA: Diagnosis present

## 2023-08-18 DIAGNOSIS — J849 Interstitial pulmonary disease, unspecified: Secondary | ICD-10-CM | POA: Diagnosis present

## 2023-08-18 DIAGNOSIS — Z885 Allergy status to narcotic agent status: Secondary | ICD-10-CM

## 2023-08-18 DIAGNOSIS — J8489 Other specified interstitial pulmonary diseases: Secondary | ICD-10-CM | POA: Diagnosis present

## 2023-08-18 DIAGNOSIS — O9962 Diseases of the digestive system complicating childbirth: Secondary | ICD-10-CM | POA: Diagnosis present

## 2023-08-18 DIAGNOSIS — E669 Obesity, unspecified: Secondary | ICD-10-CM

## 2023-08-18 DIAGNOSIS — Z881 Allergy status to other antibiotic agents status: Secondary | ICD-10-CM

## 2023-08-18 DIAGNOSIS — O1002 Pre-existing essential hypertension complicating childbirth: Secondary | ICD-10-CM | POA: Diagnosis not present

## 2023-08-18 DIAGNOSIS — O99214 Obesity complicating childbirth: Secondary | ICD-10-CM | POA: Diagnosis present

## 2023-08-18 DIAGNOSIS — O99210 Obesity complicating pregnancy, unspecified trimester: Secondary | ICD-10-CM | POA: Diagnosis present

## 2023-08-18 HISTORY — DX: Depression, unspecified: F32.A

## 2023-08-18 HISTORY — DX: Unspecified abnormal cytological findings in specimens from vagina: R87.629

## 2023-08-18 LAB — COMPREHENSIVE METABOLIC PANEL
ALT: 10 U/L (ref 0–44)
AST: 16 U/L (ref 15–41)
Albumin: 2.8 g/dL — ABNORMAL LOW (ref 3.5–5.0)
Alkaline Phosphatase: 72 U/L (ref 38–126)
Anion gap: 11 (ref 5–15)
BUN: <5 mg/dL — ABNORMAL LOW (ref 6–20)
CO2: 19 mmol/L — ABNORMAL LOW (ref 22–32)
Calcium: 8.3 mg/dL — ABNORMAL LOW (ref 8.9–10.3)
Chloride: 107 mmol/L (ref 98–111)
Creatinine, Ser: 0.47 mg/dL (ref 0.44–1.00)
GFR, Estimated: >60 mL/min (ref >60)
Glucose, Bld: 100 mg/dL — ABNORMAL HIGH (ref 70–99)
Potassium: 3.3 mmol/L — ABNORMAL LOW (ref 3.5–5.1)
Sodium: 137 mmol/L (ref 135–145)
Total Bilirubin: 0.4 mg/dL (ref 0.3–1.2)
Total Protein: 6.2 g/dL — ABNORMAL LOW (ref 6.5–8.1)

## 2023-08-18 LAB — BLOOD GAS, ARTERIAL
Acid-base deficit: 3.4 mmol/L — ABNORMAL HIGH (ref 0.0–2.0)
Bicarbonate: 20.1 mmol/L (ref 20.0–28.0)
Drawn by: 6156
O2 Saturation: 99.5 %
Patient temperature: 37
pCO2 arterial: 31 mm[Hg] — ABNORMAL LOW (ref 32–48)
pH, Arterial: 7.42 (ref 7.35–7.45)
pO2, Arterial: 128 mm[Hg] — ABNORMAL HIGH (ref 83–108)

## 2023-08-18 LAB — CBC
HCT: 35.7 % — ABNORMAL LOW (ref 36.0–46.0)
Hemoglobin: 12 g/dL (ref 12.0–15.0)
MCH: 28.4 pg (ref 26.0–34.0)
MCHC: 33.6 g/dL (ref 30.0–36.0)
MCV: 84.6 fL (ref 80.0–100.0)
Platelets: UNDETERMINED 10*3/uL (ref 150–400)
RBC: 4.22 MIL/uL (ref 3.87–5.11)
RDW: 13.5 % (ref 11.5–15.5)
WBC: 8.1 10*3/uL (ref 4.0–10.5)
nRBC: 0 % (ref 0.0–0.2)

## 2023-08-18 LAB — CULTURE, BETA STREP (GROUP B ONLY): Strep Gp B Culture: NEGATIVE

## 2023-08-18 LAB — COOXEMETRY PANEL
Carboxyhemoglobin: 1.8 % — ABNORMAL HIGH (ref 0.5–1.5)
Methemoglobin: 1.1 % (ref 0.0–1.5)
O2 Saturation: 99.9 %
Total hemoglobin: 12.6 g/dL (ref 12.0–16.0)

## 2023-08-18 LAB — TYPE AND SCREEN
ABO/RH(D): O POS
Antibody Screen: NEGATIVE

## 2023-08-18 MED ORDER — TERBUTALINE SULFATE 1 MG/ML IJ SOLN: 0.2500 mg | Freq: Once | INTRAMUSCULAR | Status: DC | PRN

## 2023-08-18 MED ORDER — HYDROXYCHLOROQUINE SULFATE 200 MG PO TABS
200.0000 mg | ORAL_TABLET | Freq: Two times a day (BID) | ORAL | Status: DC
  Administered 2023-08-19: 200 mg via ORAL
  Filled 2023-08-18 (×2): qty 1

## 2023-08-18 MED ORDER — AZATHIOPRINE 50 MG PO TABS: 50.0000 mg | ORAL_TABLET | Freq: Every day | ORAL | Status: DC

## 2023-08-18 MED ORDER — LACTATED RINGERS IV SOLN: INTRAVENOUS | Status: DC

## 2023-08-18 MED ORDER — PREDNISONE 5 MG PO TABS
5.0000 mg | ORAL_TABLET | Freq: Every day | ORAL | Status: DC
  Administered 2023-08-19 – 2023-08-22 (×4): 5 mg via ORAL
  Filled 2023-08-18 (×9): qty 1

## 2023-08-18 MED ORDER — IPRATROPIUM BROMIDE 0.02 % IN SOLN: 0.5000 mg | Freq: Four times a day (QID) | RESPIRATORY_TRACT | Status: DC | PRN

## 2023-08-18 MED ORDER — MISOPROSTOL 25 MCG QUARTER TABLET
25.0000 ug | ORAL_TABLET | Freq: Once | ORAL | Status: AC
  Administered 2023-08-18: 25 ug via ORAL
  Filled 2023-08-18: qty 1

## 2023-08-18 MED ORDER — ACETAMINOPHEN-CAFFEINE 500-65 MG PO TABS
2.0000 | ORAL_TABLET | Freq: Once | ORAL | Status: AC
  Administered 2023-08-18: 2 via ORAL
  Filled 2023-08-18: qty 2

## 2023-08-18 MED ORDER — SOD CITRATE-CITRIC ACID 500-334 MG/5ML PO SOLN: 30.0000 mL | ORAL | Status: DC | PRN

## 2023-08-18 MED ORDER — MISOPROSTOL 25 MCG QUARTER TABLET
25.0000 ug | ORAL_TABLET | Freq: Once | ORAL | Status: AC
  Administered 2023-08-18: 25 ug via VAGINAL
  Filled 2023-08-18: qty 1

## 2023-08-18 MED ORDER — FENTANYL CITRATE (PF) 100 MCG/2ML IJ SOLN
50.0000 ug | INTRAMUSCULAR | Status: DC | PRN
  Administered 2023-08-19: 50 ug via INTRAVENOUS
  Filled 2023-08-18 (×2): qty 2

## 2023-08-18 MED ORDER — ONDANSETRON HCL 4 MG/2ML IJ SOLN: 4.0000 mg | Freq: Four times a day (QID) | INTRAMUSCULAR | Status: DC | PRN

## 2023-08-18 MED ORDER — AZATHIOPRINE 50 MG PO TABS
150.0000 mg | ORAL_TABLET | Freq: Every day | ORAL | Status: DC
  Administered 2023-08-19 – 2023-08-22 (×4): 150 mg via ORAL
  Filled 2023-08-18 (×4): qty 3

## 2023-08-18 MED ORDER — LACTATED RINGERS IV SOLN: 500.0000 mL | INTRAVENOUS | Status: DC | PRN

## 2023-08-18 MED ORDER — ACETAMINOPHEN 325 MG PO TABS: 650.0000 mg | ORAL_TABLET | ORAL | Status: DC | PRN

## 2023-08-18 MED ORDER — LIDOCAINE HCL (PF) 1 % IJ SOLN: 30.0000 mL | INTRAMUSCULAR | Status: DC | PRN

## 2023-08-18 MED ORDER — OXYTOCIN-SODIUM CHLORIDE 30-0.9 UT/500ML-% IV SOLN
2.5000 [IU]/h | INTRAVENOUS | Status: DC
  Filled 2023-08-18: qty 500

## 2023-08-18 MED ORDER — OXYTOCIN BOLUS FROM INFUSION
333.0000 mL | Freq: Once | INTRAVENOUS | Status: AC
  Administered 2023-08-19: 333 mL via INTRAVENOUS

## 2023-08-18 NOTE — MAU Note (Signed)
Spoke with Caryn Bee in Resp about the ABG and other arterial draw, he will be down to get them.

## 2023-08-18 NOTE — Procedures (Signed)
Kayla [name] Jan 05, 1991 [redacted]w[redacted]d  Fetus A Non-Stress Test Interpretation for 08/18/23  Indication: Chronic Hypertenstion and Obese, Hx IUFD  Fetal Heart Rate A Mode: External Baseline Rate (A): 135 bpm Variability: Moderate, Minimal Accelerations: 15 x 15 Decelerations: None Multiple birth?: No  Uterine Activity Mode: Toco Contraction Frequency (min): occas Contraction Duration (sec): 50-100 Contraction Quality: Mild Resting Tone Palpated: Relaxed  Interpretation (Fetal Testing) Comments: Tracing reviewed by Dr. Darra Lis

## 2023-08-18 NOTE — MAU Note (Signed)

## 2023-08-18 NOTE — MAU Note (Signed)
Kayla [name] is a 32 y.o. at [redacted]w[redacted]d here in MAU reporting: she has a HA and SOB that began last night.  States took Tylenol this morning @ 0900, states slept after taking, but no relief noted.  States she was exposed to carbon monoxide in her apartment on 9/25/202.  Reports having mid sternal chest tightness that began yesterday. Denies VB or LOF.  Endorses +FM, not "normal pattern"  LMP: NA Onset of complaint: yesterday Pain score: 7 Vitals:   08/18/23 1601  BP: 117/68  Pulse: (!) 112  Resp: 18  Temp: 98.4 F (36.9 C)  SpO2: 96%     FHT:142 bpm Lab orders placed from triage:   UA

## 2023-08-18 NOTE — MAU Note (Signed)
Exposure was Wed night, a little before midnight.  Denies nausea, has been a little light headed. Pt speaking without pausing, no use of accessory muscle, no flaring noted. O2 sat 96

## 2023-08-18 NOTE — H&P (Signed)
OBSTETRIC ADMISSION HISTORY AND PHYSICAL  Hanh Kertesz is a 32 y.o. female [phone] with IUP at [redacted]w[redacted]d (dated by 5 wk U/S, Estimated Date of Delivery: 09/07/23) presenting for chest tightness and headache. She reports +FMs, No LOF, no VB, no blurry vision or peripheral edema, and RUQ pain.  She plans on formula feeding. She request BTL for birth control. She received her prenatal care at [facility]   Patient presented to MAU for headache and chest tightness in setting of recent carbon monoxide exposure. She had new carbon monoxide detectors placed in her apartment recently and on 9/25 around midnight, she heard her detector go off. She states it went off for about 20 minutes before she went to check to see what the sound was. She had fire department come evaluate her home and they found CO leak from water heater. See media for further information on the exposure. She was evaluated by EMS as well who recommended ED evaluation, but pt declined because the entire process took about four hours and she was tired and had a headache. Pt states she had an appt the following day with her OB. She reports headache continued for the following days and had onset of chest tightness today therefore presented to MAU. Describes chest tightness and cough -- reports worse with significant exertion. She has a history of ILD and previously required O2 therapy, but ILD has been stable for some time and these are all new symptoms. She denies any radiation of chest pain, denies a pressure like sensation. She tried taking Tums without relief of symptoms. Continues to feel tired.  Prenatal History/Complications:  - ILD - RA - SLE - Sjogrens - cHTN (not on meds) - GERD - h/o IUFD  Past Medical History: Past Medical History:  Diagnosis Date   Acute respiratory failure with hypoxemia (HCC) 09/19/2017   Acute respiratory failure with hypoxia (HCC)    Arthritis    "hands and legs" (01/08/2015)   CAP (community acquired pneumonia)  01/07/2015   Chronic Respiratory failure with oxygen requirement affecting pregnancy, antepartum 05/16/2016   Resolved pulmonary HTN on 2018 echo (in our epic).   Pt off O2 in 2018.   Daily headache    "sometimes" (01/08/2015)   Depression    2017   Gastroesophageal reflux disease without esophagitis 07/13/2020   GERD (gastroesophageal reflux disease)    Hypertension    No longer takes meds   ILD (interstitial lung disease) (HCC)    Insomnia 08/21/2015   Loud P2 (pulmonary S2, second heart sound) 08/21/2015   Lung disease    Lupus (HCC)    Lupus (systemic lupus erythematosus) (HCC) 08/21/2015   Multifocal pneumonia    Pulmonary hypertension (HCC)    Seasonal allergies 03/02/2023   Sjogren's syndrome (HCC)    SS-A antibody positive 06/02/2016   SS-B antibody positive 06/02/2016   STD (sexually transmitted disease)    Chlamydia   Vaginal Pap smear, abnormal    repeats have been ok   Viral illness 03/02/2023    Past Surgical History: Past Surgical History:  Procedure Laterality Date   CARDIAC CATHETERIZATION N/A 09/09/2015   Procedure: Right Heart Cath;  Surgeon: Laurey Morale, MD;  Location: [facility] INVASIVE CV LAB;  Service: Cardiovascular;  Laterality: N/A;   DILATION AND EVACUATION N/A 07/13/2015   Procedure: DILATATION AND EVACUATION;  Surgeon: Levie Heritage, DO;  Location: [facility] ORS;  Service: Gynecology;  Laterality: N/A;   FINGER SURGERY Right 03/2014   "laceration, nerve/artery injury" 2nd digit  VIDEO BRONCHOSCOPY Bilateral 01/12/2015   Procedure: VIDEO BRONCHOSCOPY WITH FLUORO;  Surgeon: Leslye Peer, MD;  Location: [facility] ENDOSCOPY;  Service: Cardiopulmonary;  Laterality: Bilateral;    Obstetrical History: OB History     Gravida  5   Para  2   Term  1   Preterm  1   AB  2   Living  1      SAB  2   IAB  0   Ectopic  0   Multiple  0   Live Births  1           Social History Social History   Socioeconomic History   Marital status: Single     Spouse name: Not on file   Number of children: Not on file   Years of education: Not on file   Highest education level: Not on file  Occupational History   Occupation: [employer]     Comment: Workers Compensation  Tobacco Use   Smoking status: Former    Current packs/day: 0.00    Average packs/day: 0.1 packs/day for 5.0 years (0.5 ttl pk-yrs)    Types: Cigarettes    Start date: 11/20/2009    Quit date: 11/20/2014    Years since quitting: 8.7   Smokeless tobacco: Never  Vaping Use   Vaping status: Never Used  Substance and Sexual Activity   Alcohol use: Not Currently    Comment: Occas   Drug use: No   Sexual activity: Not Currently    Birth control/protection: None    Comment: First IC <16 y/o, Hx of CT+  Other Topics Concern   Not on file  Social History Narrative   Not on file   Social Determinants of Health   Financial Resource Strain: Not on file  Food Insecurity: No Food Insecurity (02/23/2023)   Hunger Vital Sign    Worried About Running Out of Food in the Last Year: Never true    Ran Out of Food in the Last Year: Never true  Transportation Needs: No Transportation Needs (02/23/2023)   PRAPARE - Administrator, Civil Service (Medical): No    Lack of Transportation (Non-Medical): No  Physical Activity: Not on file  Stress: Not on file  Social Connections: Not on file    Family History: Family History  Problem Relation Age of Onset   Hypertension Mother    Deep vein thrombosis Mother    Arthritis Father    Cancer Brother        Found in jaw area   Diabetes Maternal Aunt    Stroke Maternal Grandmother    Hypertension Maternal Grandmother    Diabetes Maternal Grandmother    Hypertension Maternal Grandfather     Allergies: Allergies  Allergen Reactions   Hydrocodone Nausea And Vomiting   Zithromax [Azithromycin] Itching and Cough    Medications Prior to Admission  Medication Sig Dispense Refill Last Dose   acetaminophen (TYLENOL) 500 MG tablet  Take 1,000 mg by mouth every 6 (six) hours as needed.   08/18/2023 at 0900   aspirin EC 81 MG tablet Take 1 tablet (81 mg total) by mouth daily. Swallow whole. 30 tablet 12 08/18/2023   azaTHIOprine (IMURAN) 50 MG tablet Take 50 mg by mouth daily.   08/18/2023   cyclobenzaprine (FLEXERIL) 10 MG tablet Take 1 tablet (10 mg total) by mouth 2 (two) times daily as needed for muscle spasms. 20 tablet 0 Past Month   hydroxychloroquine (PLAQUENIL) 200 MG tablet Take  200 mg by mouth 2 (two) times daily.   08/18/2023   ipratropium (ATROVENT HFA) 17 MCG/ACT inhaler Inhale 2 puffs into the lungs every 6 (six) hours as needed for wheezing. 1 each 12 08/18/2023 at 1300   nystatin-triamcinolone (MYCOLOG II) cream Apply to affected area daily 15 g 0 Past Month   ondansetron (ZOFRAN-ODT) 4 MG disintegrating tablet Take 1 tablet (4 mg total) by mouth every 6 (six) hours as needed for nausea. 20 tablet 1 Past Week   pantoprazole (PROTONIX) 40 MG tablet Take 1 tablet (40 mg total) by mouth 2 (two) times daily before a meal. 60 tablet 5 08/18/2023   predniSONE (DELTASONE) 5 MG tablet Take 5 mg by mouth daily with breakfast.   08/18/2023   Prenatal 28-0.8 MG TABS Take 1 tablet by mouth daily. 30 tablet 12 08/18/2023   albuterol (VENTOLIN HFA) 108 (90 Base) MCG/ACT inhaler Inhale 2 puffs into the lungs every 6 (six) hours as needed for wheezing or shortness of breath. 8 g 2      Review of Systems  All systems reviewed and negative except as stated in HPI.  Blood pressure 119/71, pulse 80, temperature 98.4 F (36.9 C), temperature source Oral, resp. rate 18, height 5\' 3"  (1.6 m), weight 87.8 kg, last menstrual period 12/05/2022, SpO2 97%. General appearance: alert, cooperative, and appears stated age Lungs: fine crackles bilateral lung fields Heart: regular rate and rhythm Abdomen: soft, non-tender; bowel sounds normal Neuro: alert and oriented to time, place, person, no facial asymmetry or focal deficits Skin: no  rash Pelvic: deferred Extremities: no edema of bilateral lower extremities Presentation: cephalic Fetal monitoring: 150/mod/+a/-d Uterine activityNone     Prenatal labs: ABO, Rh: O/Positive/-- (04/04 1156) Antibody: Negative (04/04 1156) Rubella: 4.29 (04/04 1156) RPR: Non Reactive (08/01 0953)  HBsAg: Negative (04/04 1156)  HIV: Non Reactive (08/01 0953)  GBS: Negative/-- (09/23 1156)  2 hr Glucola wnl, early A1c 5.8% Genetic screening  low risk Anatomy US - U/S at [redacted]w[redacted]d with normal anatomy, central cord insertion, fundal placenta Last [place]: At [redacted]w[redacted]d - cephalic presentation, EFW 7#2 (76 %tile), AC 92%  Prenatal Transfer Tool  Maternal Diabetes: No, prediabetic on early labs Genetic Screening: Normal Maternal Ultrasounds/Referrals: Normal Fetal Ultrasounds or other Referrals:  Fetal echo, Referred to Materal Fetal Medicine  Maternal Substance Abuse:  No Significant Maternal Medications:  Azathioprine, ldASA, Plaquenil, PNV Significant Maternal Lab Results:  Group B Strep negative Number of Prenatal Visits:greater than 3 verified prenatal visits Other Comments:  None  Results for orders placed or performed during the hospital encounter of 08/18/23 (from the past 24 hour(s))  CBC   Collection Time: 08/18/23  4:29 PM  Result Value Ref Range   WBC 8.1 4.0 - 10.5 K/uL   RBC 4.22 3.87 - 5.11 MIL/uL   Hemoglobin 12.0 12.0 - 15.0 g/dL   HCT 52.8 (L) 41.3 - 24.4 %   MCV 84.6 80.0 - 100.0 fL   MCH 28.4 26.0 - 34.0 pg   MCHC 33.6 30.0 - 36.0 g/dL   RDW 01.0 27.2 - 53.6 %   Platelets PLATELET CLUMPS NOTED ON SMEAR, UNABLE TO ESTIMATE 150 - 400 K/uL   nRBC 0.0 0.0 - 0.2 %  Comprehensive metabolic panel   Collection Time: 08/18/23  4:29 PM  Result Value Ref Range   Sodium 137 135 - 145 mmol/L   Potassium 3.3 (L) 3.5 - 5.1 mmol/L   Chloride 107 98 - 111 mmol/L   CO2 19 (L) 22 -  32 mmol/L   Glucose, Bld 100 (H) 70 - 99 mg/dL   BUN <5 (L) 6 - 20 mg/dL   Creatinine, Ser 1.61 0.44  - 1.00 mg/dL   Calcium 8.3 (L) 8.9 - 10.3 mg/dL   Total Protein 6.2 (L) 6.5 - 8.1 g/dL   Albumin 2.8 (L) 3.5 - 5.0 g/dL   AST 16 15 - 41 U/L   ALT 10 0 - 44 U/L   Alkaline Phosphatase 72 38 - 126 U/L   Total Bilirubin 0.4 0.3 - 1.2 mg/dL   GFR, Estimated >09 >60 mL/min   Anion gap 11 5 - 15    Patient Active Problem List   Diagnosis Date Noted   Long term (current) use of systemic steroids 07/26/2023   Thrombocytopenia affecting pregnancy (HCC) 06/23/2023   Obesity affecting pregnancy 04/14/2023   Alpha thalassemia silent carrier 03/11/2023   Supervision of high risk pregnancy, antepartum, RED CHART patient 02/16/2023   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 [name] at [redacted]w[redacted]d presented to MAU due to headache/chest pain in setting of recent carbon monoxide exposure. Her vitals have been stable, CBC and CMP were reassuring. ABG and carboxyhemoglobin pending. EKG wnl, CXR without acute changes. Pt started on HFNC with improvement of symptoms. Pt with history of cHTN -- was scheduled for IOL, so will admit to L&D for IOL as well as further monitoring.  #Labor: Cx exam deferred while in MAU, will reassess on presentation to L&D for further planning. #Pain: Per pt request #FWB: Cat I #ID:  GBS neg #MOF: Formula #MOC:BTL #Circ:  N/a  #Chest pain and headache in setting of recent CO exposure:  - Reached out to Poison Control at 1607 - they recommended baseline CBC, CMP, ABG, carboxyhemoglobin, EKG, and CXR. Recommend to start HFNC and continue until symptoms resolve.  - 1731 EKG back -- showing nonspecific ST and T wave changes, paged cardiology -- spoke with Dr. Royann Shivers regarding findings -- he reviewed EKG, was wnl - CXR no acute process - 1920 - pt reports chest  tightness resolved, headache improving, will cont HFNC  #SLE: On Imuran, Plaquenil  f/b [facility]  #ILD: Previously required supplemental O2, but more recently has had symptoms well controlled, on HFNC for above  on Atrovent   #cHTN: Not on meds, normotensive on arrival  #H/o neonatal demise -- in 2017, infant born preterm ~34 weeks  #RA  #Sjogren's syndrome  #Thrombocytopenia  #Silent carrier for alpha thal   Sundra Aland, MD OB Fellow, Faculty Practice [facility], Center for [facility] 08/18/23  7:48 PM

## 2023-08-19 ENCOUNTER — Inpatient Hospital Stay (HOSPITAL_COMMUNITY): Payer: Medicare HMO | Admitting: Anesthesiology

## 2023-08-19 ENCOUNTER — Encounter (HOSPITAL_COMMUNITY): Payer: Self-pay | Admitting: Obstetrics and Gynecology

## 2023-08-19 DIAGNOSIS — O1002 Pre-existing essential hypertension complicating childbirth: Secondary | ICD-10-CM | POA: Diagnosis not present

## 2023-08-19 DIAGNOSIS — O9912 Other diseases of the blood and blood-forming organs and certain disorders involving the immune mechanism complicating childbirth: Secondary | ICD-10-CM | POA: Diagnosis not present

## 2023-08-19 DIAGNOSIS — O09293 Supervision of pregnancy with other poor reproductive or obstetric history, third trimester: Secondary | ICD-10-CM | POA: Diagnosis not present

## 2023-08-19 DIAGNOSIS — Z3A37 37 weeks gestation of pregnancy: Secondary | ICD-10-CM

## 2023-08-19 LAB — CBC WITH DIFFERENTIAL/PLATELET
Abs Immature Granulocytes: 0.11 10*3/uL — ABNORMAL HIGH (ref 0.00–0.07)
Basophils Absolute: 0 10*3/uL (ref 0.0–0.1)
Basophils Relative: 0 %
Eosinophils Absolute: 0.1 10*3/uL (ref 0.0–0.5)
Eosinophils Relative: 1 %
HCT: 35.3 % — ABNORMAL LOW (ref 36.0–46.0)
Hemoglobin: 11.6 g/dL — ABNORMAL LOW (ref 12.0–15.0)
Immature Granulocytes: 1 %
Lymphocytes Relative: 33 %
Lymphs Abs: 2.6 10*3/uL (ref 0.7–4.0)
MCH: 27.2 pg (ref 26.0–34.0)
MCHC: 32.9 g/dL (ref 30.0–36.0)
MCV: 82.9 fL (ref 80.0–100.0)
Monocytes Absolute: 0.8 10*3/uL (ref 0.1–1.0)
Monocytes Relative: 10 %
Neutro Abs: 4.4 10*3/uL (ref 1.7–7.7)
Neutrophils Relative %: 55 %
Platelets: 121 10*3/uL — ABNORMAL LOW (ref 150–400)
RBC: 4.26 MIL/uL (ref 3.87–5.11)
RDW: 13.5 % (ref 11.5–15.5)
WBC: 8 10*3/uL (ref 4.0–10.5)
nRBC: 0 % (ref 0.0–0.2)

## 2023-08-19 LAB — TECHNOLOGIST SMEAR REVIEW: Plt Morphology: ADEQUATE

## 2023-08-19 LAB — CBC
HCT: 36.6 % (ref 36.0–46.0)
Hemoglobin: 12 g/dL (ref 12.0–15.0)
MCH: 27.5 pg (ref 26.0–34.0)
MCHC: 32.8 g/dL (ref 30.0–36.0)
MCV: 83.8 fL (ref 80.0–100.0)
Platelets: 152 10*3/uL (ref 150–400)
RBC: 4.37 MIL/uL (ref 3.87–5.11)
RDW: 13.5 % (ref 11.5–15.5)
WBC: 8.6 10*3/uL (ref 4.0–10.5)
nRBC: 0 % (ref 0.0–0.2)

## 2023-08-19 LAB — RPR: RPR Ser Ql: NONREACTIVE

## 2023-08-19 MED ORDER — FENTANYL-BUPIVACAINE-NACL 0.5-0.125-0.9 MG/250ML-% EP SOLN
12.0000 mL/h | EPIDURAL | Status: DC | PRN
  Filled 2023-08-19: qty 250

## 2023-08-19 MED ORDER — LACTATED RINGERS IV SOLN: 500.0000 mL | Freq: Once | INTRAVENOUS | Status: DC

## 2023-08-19 MED ORDER — FENTANYL-BUPIVACAINE-NACL 0.5-0.125-0.9 MG/250ML-% EP SOLN
EPIDURAL | Status: DC | PRN
  Administered 2023-08-19: 12 mL/h via EPIDURAL

## 2023-08-19 MED ORDER — PHENYLEPHRINE 80 MCG/ML (10ML) SYRINGE FOR IV PUSH (FOR BLOOD PRESSURE SUPPORT): 80.0000 ug | PREFILLED_SYRINGE | INTRAVENOUS | Status: DC | PRN

## 2023-08-19 MED ORDER — HYDROXYCHLOROQUINE SULFATE 200 MG PO TABS
200.0000 mg | ORAL_TABLET | Freq: Once | ORAL | Status: AC
  Administered 2023-08-19: 200 mg via ORAL
  Filled 2023-08-19: qty 1

## 2023-08-19 MED ORDER — BENZOCAINE-MENTHOL 20-0.5 % EX AERO
1.0000 | INHALATION_SPRAY | CUTANEOUS | Status: DC | PRN
  Filled 2023-08-19: qty 56

## 2023-08-19 MED ORDER — HYDROXYCHLOROQUINE SULFATE 200 MG PO TABS
400.0000 mg | ORAL_TABLET | Freq: Every day | ORAL | Status: DC
  Administered 2023-08-20 – 2023-08-22 (×3): 400 mg via ORAL
  Filled 2023-08-19 (×3): qty 2

## 2023-08-19 MED ORDER — FUROSEMIDE 20 MG PO TABS
40.0000 mg | ORAL_TABLET | Freq: Once | ORAL | Status: AC
  Administered 2023-08-19: 40 mg via ORAL
  Filled 2023-08-19: qty 2

## 2023-08-19 MED ORDER — OXYCODONE HCL 5 MG PO TABS
10.0000 mg | ORAL_TABLET | Freq: Three times a day (TID) | ORAL | Status: DC
  Administered 2023-08-19 – 2023-08-22 (×7): 10 mg via ORAL
  Filled 2023-08-19 (×7): qty 2

## 2023-08-19 MED ORDER — ONDANSETRON HCL 4 MG PO TABS: 4.0000 mg | ORAL_TABLET | ORAL | Status: DC | PRN

## 2023-08-19 MED ORDER — PRENATAL MULTIVITAMIN CH
1.0000 | ORAL_TABLET | Freq: Every day | ORAL | Status: DC
  Administered 2023-08-20 – 2023-08-22 (×3): 1 via ORAL
  Filled 2023-08-19 (×2): qty 1

## 2023-08-19 MED ORDER — DIPHENHYDRAMINE HCL 50 MG/ML IJ SOLN: 12.5000 mg | INTRAMUSCULAR | Status: DC | PRN

## 2023-08-19 MED ORDER — TETANUS-DIPHTH-ACELL PERTUSSIS 5-2.5-18.5 LF-MCG/0.5 IM SUSY: 0.5000 mL | PREFILLED_SYRINGE | Freq: Once | INTRAMUSCULAR | Status: DC

## 2023-08-19 MED ORDER — EPHEDRINE 5 MG/ML INJ: 10.0000 mg | INTRAVENOUS | Status: DC | PRN

## 2023-08-19 MED ORDER — IBUPROFEN 600 MG PO TABS
600.0000 mg | ORAL_TABLET | Freq: Four times a day (QID) | ORAL | Status: DC
  Administered 2023-08-19 – 2023-08-22 (×12): 600 mg via ORAL
  Filled 2023-08-19 (×12): qty 1

## 2023-08-19 MED ORDER — ACETAMINOPHEN 325 MG PO TABS
650.0000 mg | ORAL_TABLET | ORAL | Status: DC | PRN
  Administered 2023-08-19 – 2023-08-20 (×4): 650 mg via ORAL
  Filled 2023-08-19 (×4): qty 2

## 2023-08-19 MED ORDER — SIMETHICONE 80 MG PO CHEW: 80.0000 mg | CHEWABLE_TABLET | ORAL | Status: DC | PRN

## 2023-08-19 MED ORDER — OXYCODONE HCL 5 MG PO TABS: 2.5000 mg | ORAL_TABLET | ORAL | Status: DC | PRN

## 2023-08-19 MED ORDER — ONDANSETRON HCL 4 MG/2ML IJ SOLN: 4.0000 mg | INTRAMUSCULAR | Status: DC | PRN

## 2023-08-19 MED ORDER — SENNOSIDES-DOCUSATE SODIUM 8.6-50 MG PO TABS
2.0000 | ORAL_TABLET | Freq: Every day | ORAL | Status: DC
  Administered 2023-08-20 – 2023-08-22 (×3): 2 via ORAL
  Filled 2023-08-19 (×3): qty 2

## 2023-08-19 MED ORDER — OXYTOCIN-SODIUM CHLORIDE 30-0.9 UT/500ML-% IV SOLN
1.0000 m[IU]/min | INTRAVENOUS | Status: DC
  Administered 2023-08-19: 2 m[IU]/min via INTRAVENOUS

## 2023-08-19 MED ORDER — FENTANYL CITRATE (PF) 100 MCG/2ML IJ SOLN
100.0000 ug | INTRAMUSCULAR | Status: AC | PRN
  Administered 2023-08-19: 100 ug via INTRAVENOUS

## 2023-08-19 MED ORDER — MISOPROSTOL 50MCG HALF TABLET
50.0000 ug | ORAL_TABLET | Freq: Once | ORAL | Status: AC
  Administered 2023-08-19: 50 ug via ORAL
  Filled 2023-08-19: qty 1

## 2023-08-19 MED ORDER — TERBUTALINE SULFATE 1 MG/ML IJ SOLN: 0.2500 mg | Freq: Once | INTRAMUSCULAR | Status: DC | PRN

## 2023-08-19 MED ORDER — LIDOCAINE HCL (PF) 1 % IJ SOLN
INTRAMUSCULAR | Status: DC | PRN
  Administered 2023-08-19: 2 mL via EPIDURAL
  Administered 2023-08-19: 10 mL via EPIDURAL

## 2023-08-19 MED ORDER — FAMOTIDINE 20 MG PO TABS
20.0000 mg | ORAL_TABLET | Freq: Two times a day (BID) | ORAL | Status: DC
  Administered 2023-08-19 – 2023-08-22 (×6): 20 mg via ORAL
  Filled 2023-08-19 (×6): qty 1

## 2023-08-19 MED ORDER — COCONUT OIL OIL: 1.0000 | TOPICAL_OIL | Status: DC | PRN

## 2023-08-19 MED ORDER — DIBUCAINE (PERIANAL) 1 % EX OINT: 1.0000 | TOPICAL_OINTMENT | CUTANEOUS | Status: DC | PRN

## 2023-08-19 MED ORDER — ZOLPIDEM TARTRATE 5 MG PO TABS: 5.0000 mg | ORAL_TABLET | Freq: Every evening | ORAL | Status: DC | PRN

## 2023-08-19 MED ORDER — WITCH HAZEL-GLYCERIN EX PADS: 1.0000 | MEDICATED_PAD | CUTANEOUS | Status: DC | PRN

## 2023-08-19 MED ORDER — DIPHENHYDRAMINE HCL 25 MG PO CAPS: 25.0000 mg | ORAL_CAPSULE | Freq: Four times a day (QID) | ORAL | Status: DC | PRN

## 2023-08-19 NOTE — Discharge Summary (Signed)
Postpartum Discharge Summary  Date of Service updated***     Patient Name: Kayla [name] DOB: 26-Oct-1991 MRN: [id number]  Date of admission: 08/18/2023 Delivery date:08/19/2023 Delivering provider: [name] Date of discharge: 08/19/2023  Admitting diagnosis: History of IUFD [Z87.59] Intrauterine pregnancy: [redacted]w[redacted]d     Secondary diagnosis:  Principal Problem:   History of IUFD  Additional problems: ***    Discharge diagnosis: {DX.:23714}                                              Post partum procedures:{Postpartum procedures:23558} Augmentation: {Augmentation:20782} Complications: {OB Labor/Delivery Complications:20784}  Hospital course: {Courses:23701}  Magnesium Sulfate received: {Mag received:30440022} BMZ received: {BMZ received:30440023} Rhophylac:{Rhophylac received:30440032} NWG:{NFA:21308657} T-DaP:{Tdap:23962} Flu: {QIO:96295} RSV Vaccine received: {RSV:31013} Transfusion:{Transfusion received:30440034}  Immunizations received: Immunization History  Administered Date(s) Administered   HPV 9-valent 01/29/2015, 04/02/2015   HPV Quadrivalent 03/31/2015, 04/02/2015   Influenza Split 08/07/2015   Influenza, Seasonal, Injecte, Preservative Fre 08/07/2015   Influenza,inj,Quad PF,6+ Mos 01/08/2015, 08/17/2017, 09/23/2019   Influenza-Unspecified 12/22/2014, 01/08/2015, 08/25/2016, 08/17/2017, 08/21/2018   Moderna Sars-Covid-2 Vaccination 07/13/2020, 08/10/2020   PPD Test 01/28/2020   Pneumococcal Conjugate-13 01/07/2015   Pneumococcal Polysaccharide-23 01/08/2015   Tdap 04/06/2015, 09/08/2016, 07/20/2017, 06/22/2023    Physical exam  Vitals:   08/19/23 1410 08/19/23 1420 08/19/23 1445 08/19/23 1500  BP: (!) 160/72 (!) 215/184 (!) 111/58 118/68  Pulse: (!) 110 (!) 118 82 80  Resp:      Temp:      TempSrc:      SpO2:      Weight:      Height:       General: {Exam; general:21111117} Lochia: {Desc;  appropriate/inappropriate:30686::"appropriate"} Uterine Fundus: {Desc; firm/soft:30687} Incision: {Exam; incision:21111123} DVT Evaluation: {Exam; dvt:2111122} Labs: Lab Results  Component Value Date   WBC 8.6 08/19/2023   HGB 12.0 08/19/2023   HCT 36.6 08/19/2023   MCV 83.8 08/19/2023   PLT 152 08/19/2023      Latest Ref Rng & Units 08/18/2023    4:29 PM  CMP  Glucose 70 - 99 mg/dL 284   BUN 6 - 20 mg/dL <5   Creatinine 1.32 - 1.00 mg/dL 4.40   Sodium 102 - 725 mmol/L 137   Potassium 3.5 - 5.1 mmol/L 3.3   Chloride 98 - 111 mmol/L 107   CO2 22 - 32 mmol/L 19   Calcium 8.9 - 10.3 mg/dL 8.3   Total Protein 6.5 - 8.1 g/dL 6.2   Total Bilirubin 0.3 - 1.2 mg/dL 0.4   Alkaline Phos 38 - 126 U/L 72   AST 15 - 41 U/L 16   ALT 0 - 44 U/L 10    Edinburgh Score:    09/13/2017    6:00 PM  Edinburgh Postnatal Depression Scale Screening Tool  I have been able to laugh and see the funny side of things. 2  I have looked forward with enjoyment to things. 0  I have blamed myself unnecessarily when things went wrong. 1  I have been anxious or worried for no good reason. 2  I have felt scared or panicky for no good reason. 1  Things have been getting on top of me. 1  I have been so unhappy that I have had difficulty sleeping. 2  I have felt sad or miserable. 0  I have been so unhappy that I have  been crying. 0  The thought of harming myself has occurred to me. 0  Edinburgh Postnatal Depression Scale Total 9   No data recorded  After visit meds:  Allergies as of 08/19/2023       Reactions   Hydrocodone Nausea And Vomiting   Zithromax [azithromycin] Itching, Cough     Med Rec must be completed prior to using this [facility]***        Discharge home in stable condition Infant Feeding: {Baby feeding:23562} Infant Disposition:{CHL IP OB HOME WITH QIHKVQ:25956} Discharge instruction: per After Visit Summary and Postpartum booklet. Activity: Advance as tolerated. Pelvic rest  for 6 weeks.  Diet: {OB LOVF:64332951} Future Appointments: Future Appointments  Date Time Provider Department Center  08/24/2023 11:00 AM Tobb, Kardie, DO [facility] None  08/28/2023 10:20 AM [facility] NURSE [facility]   Follow up Visit:   Please schedule this patient for a In person postpartum visit in 6 weeks with the following provider: Any provider. Additional Postpartum F/U:BP check 1 week  High risk pregnancy complicated by: HTN, chronic autoimmune disorder  Delivery mode:  Vaginal, Spontaneous Anticipated Birth Control:  Unsure   08/19/2023 [name], MD

## 2023-08-19 NOTE — Progress Notes (Signed)
Labor Progress Note Kayla [name] is a 32 y.o. [id number] at [redacted]w[redacted]d presented for IOL for cHTN, complicated by carbon monoxide exposure.  S: Coping well. Resting comfortably, mild cramping. H/a and SOB resolved. Will d/c HFNC, if symptoms return will restart. Discussed FB repeat trial, agreeable.  O:  BP 124/77   Pulse 94   Temp 98.4 F (36.9 C) (Oral)   Resp 17   Ht 5\' 3"  (1.6 m)   Wt 87.8 kg   LMP 12/05/2022 (Approximate)   SpO2 98%   BMI 34.28 kg/m  EFM: 135/moderate variability/accels present/no decels  CVE: Dilation: 1.5 Effacement (%): 50 Cervical Position: Posterior Station: -2 Presentation: Vertex Exam by:: Dr. Leanora Cover   A&P: 32 y.o. [id number] [redacted]w[redacted]d admitted for IOL for cHTN #Labor: Progressing well. FB inserted, 60ml sterile fluid. 50 buccal misoprostol. Anticipate AROM and/or pitocin at next check at 0830. #Pain: Coping well without medication #FWB: Cat I #GBS negative   #CO exposure - resolved.  - Poison Control at 1607 - baseline CBC, CMP, ABG, carboxyhemoglobin, EKG, and CXR. Recommend to start HFNC and continue until symptoms resolve.  - asymptomatic, d/c HFNC. Will restart if symptoms return.   #SLE: On Imuran, Plaquenil  f/b [facility]   #ILD: Previously required supplemental O2, but more recently has had symptoms well controlled, on HFNC for above  on Atrovent    #cHTN: Not on meds, normotensive on arrival   #H/o neonatal demise -- in 2017, infant born preterm ~34 weeks   #RA   #Sjogren's syndrome   #Thrombocytopenia - Plt 121   #Silent carrier for alpha thal  Kayla Forster, MD 4:22 AM

## 2023-08-19 NOTE — Progress Notes (Signed)
Labor Progress Note Kayla [name] is a 32 y.o. [id number] at [redacted]w[redacted]d presented for IOL for cHTN. C/b recent carbon monoxide exposure.   S: Patient is resting comfortably after her epidural. After AROM, feeling contracts a bit more.   O:  BP 117/71   Pulse 79   Temp 98.7 F (37.1 C) (Oral)   Resp 15   Ht 5\' 3"  (1.6 m)   Wt 87.8 kg   LMP 12/05/2022 (Approximate)   SpO2 100%   BMI 34.28 kg/m  EFM: 140/mod variabiity/10x10 accels, some variable decels   CVE: Dilation: 7 Effacement (%): 80 Cervical Position: Middle Station: -1 Presentation: Vertex Exam by:: Earlene Plater, MD   A&P: 32 y.o. J4N8295 [redacted]w[redacted]d for IOL as above  #Labor: Progressing well after AROM and starting Pit. Some variables noted, likely 2/2 to cervical change. Will try position changes and continue Pitocin #Pain: epidural #FWB: Cat II, though overall reassuring with return to baseline and accels present  #GBS negative  #cHTN: Not on meds, BP have been appropriate during admission.  - ctm  # CO exposure: resolved   [name], MD Center for [employer], [facility] 1:21 PM

## 2023-08-19 NOTE — Progress Notes (Signed)
LABOR PROGRESS NOTE  Kayla [name] is a 32 y.o. [id number] at [redacted]w[redacted]d presented for IOL for cHTN. C/b recent carbon monoxide exposure.  S: Feeling overall comfortable. Amenable to pit and AROM.  O:  BP 117/71   Pulse 79   Temp 98.7 F (37.1 C) (Oral)   Resp 15   Ht 5\' 3"  (1.6 m)   Wt 87.8 kg   LMP 12/05/2022 (Approximate)   SpO2 100%   BMI 34.28 kg/m  EFM:140 bpm/Moderate variability/ 15x15 accels/ None decels CAT: 1 Toco: rare   CVE: 4/50/-2   A&P: 51 y.o. [id number] [redacted]w[redacted]d here for IOL as above  #Labor: Progressing well after FB. AROM performed after discussion of risks/benefits and verbal consent obtained. Clear fluid obtained. Start pitocin. #Pain: plans Epidural #FWB: CAT 1 #GBS negative  [name], MD FMOB Fellow, Faculty practice [facility], Center for [facility] 08/19/23  1:14 PM

## 2023-08-19 NOTE — Progress Notes (Signed)
Labor Progress Note Kayla [name] is a 32 y.o. G3O7564 at [redacted]w[redacted]d presented for IOL for cHTN, complicated by carbon monoxide exposure. S: Comfortable. H/a & SOB resolved.  O:  BP 124/77   Pulse 94   Temp 98.4 F (36.9 C) (Oral)   Resp 17   Ht 5\' 3"  (1.6 m)   Wt 87.8 kg   LMP 12/05/2022 (Approximate)   SpO2 98%   BMI 34.28 kg/m  EFM: 135/moderate variability/accels present/no decels  CVE: Dilation: 1 Effacement (%): Thick Cervical Position: Posterior Station: Ballotable Presentation: Vertex   A&P: 32 y.o. P3I9518 [redacted]w[redacted]d presented to MAU due to headache/chest pain in setting of recent carbon monoxide exposure. Her vitals have been stable, CBC and CMP were reassuring. ABG and carboxyhemoglobin pending. EKG wnl, CXR without acute changes. Pt started on HFNC with improvement of symptoms. Pt with history of cHTN -- was scheduled for IOL, so will admit to L&D for IOL as well as further monitoring.   #Labor: s/p dual cytotec, failed FB placement, contracting every 3-5 mins.  #Pain:  Per pt request #FWB: Cat I #ID:      GBS neg   #Chest pain and headache in setting of recent CO exposure - improved.  - Poison Control at 1607 - baseline CBC, CMP, ABG, carboxyhemoglobin, EKG, and CXR. Recommend to start HFNC and continue until symptoms resolve.  - EKG wnl, CXR no acute process,    #SLE: On Imuran, Plaquenil  f/b [facility]   #ILD: Previously required supplemental O2, but more recently has had symptoms well controlled, on HFNC for above  on Atrovent    #cHTN: Not on meds, normotensive on arrival   #H/o neonatal demise -- in 2017, infant born preterm ~34 weeks   #RA   #Sjogren's syndrome   #Thrombocytopenia - Plt 121   #Silent carrier for alpha thal  #FWB: Cat I #GBS negative  [name], MD 3:10 AM

## 2023-08-19 NOTE — Anesthesia Preprocedure Evaluation (Signed)
Anesthesia Evaluation  Patient identified by MRN, date of birth, ID band Patient awake    Reviewed: Allergy & Precautions, Patient's Chart, lab work & pertinent test results  Airway Mallampati: II  TM Distance: >3 FB Neck ROM: Full    Dental no notable dental hx.    Pulmonary former smoker ILD 2/2 SLE, hx pulmonary HTN and home oxygen in the past   Pulmonary exam normal breath sounds clear to auscultation       Cardiovascular hypertension (chronic HTN, no home meds), Normal cardiovascular exam Rhythm:Regular Rate:Normal  Echo 04/2023: normal LVEF and valves   Neuro/Psych  Headaches PSYCHIATRIC DISORDERS  Depression    HA and chest pain 2/2 carbon monoxide exposure 2d ago- has been on and off oxygen this admission    GI/Hepatic Neg liver ROS,GERD  Controlled and Medicated,,  Endo/Other  Obesity BMI 34  Renal/GU negative Renal ROS  negative genitourinary   Musculoskeletal  (+) Arthritis , Rheumatoid disorders,    Abdominal   Peds negative pediatric ROS (+)  Hematology negative hematology ROS (+) Hb 12, plt 152   Anesthesia Other Findings   Reproductive/Obstetrics (+) Pregnancy                             Anesthesia Physical Anesthesia Plan  ASA: 3  Anesthesia Plan: Epidural   Post-op Pain Management:    Induction:   PONV Risk Score and Plan: 2  Airway Management Planned: Natural Airway  Additional Equipment: None  Intra-op Plan:   Post-operative Plan:   Informed Consent: I have reviewed the patients History and Physical, chart, labs and discussed the procedure including the risks, benefits and alternatives for the proposed anesthesia with the patient or authorized representative who has indicated his/her understanding and acceptance.       Plan Discussed with:   Anesthesia Plan Comments:        Anesthesia Quick Evaluation

## 2023-08-19 NOTE — Anesthesia Procedure Notes (Signed)
Epidural Patient location during procedure: OB Start time: 08/19/2023 12:10 PM End time: 08/19/2023 12:17 PM  Staffing Anesthesiologist: Lannie Fields, DO Performed: anesthesiologist   Preanesthetic Checklist Completed: patient identified, IV checked, risks and benefits discussed, monitors and equipment checked, pre-op evaluation and timeout performed  Epidural Patient position: sitting Prep: DuraPrep and site prepped and draped Patient monitoring: continuous pulse ox, blood pressure, heart rate and cardiac monitor Approach: midline Location: L3-L4 Injection technique: LOR air  Needle:  Needle type: Tuohy  Needle gauge: 17 G Needle length: 9 cm Needle insertion depth: 6 cm Catheter type: closed end flexible Catheter size: 19 Gauge Catheter at skin depth: 11 cm Test dose: negative  Assessment Sensory level: T8 Events: blood not aspirated, no cerebrospinal fluid, injection not painful, no injection resistance, no paresthesia and negative IV test  Additional Notes Patient identified. Risks/Benefits/Options discussed with patient including but not limited to bleeding, infection, nerve damage, paralysis, failed block, incomplete pain control, headache, blood pressure changes, nausea, vomiting, reactions to medication both or allergic, itching and postpartum back pain. Confirmed with bedside nurse the patient's most recent platelet count. Confirmed with patient that they are not currently taking any anticoagulation, have any bleeding history or any family history of bleeding disorders. Patient expressed understanding and wished to proceed. All questions were answered. Sterile technique was used throughout the entire procedure. Please see nursing notes for vital signs. Test dose was given through epidural catheter and negative prior to continuing to dose epidural or start infusion. Warning signs of high block given to the patient including shortness of breath, tingling/numbness in  hands, complete motor block, or any concerning symptoms with instructions to call for help. Patient was given instructions on fall risk and not to get out of bed. All questions and concerns addressed with instructions to call with any issues or inadequate analgesia.  Reason for block:procedure for pain

## 2023-08-20 MED ORDER — FUROSEMIDE 20 MG PO TABS
40.0000 mg | ORAL_TABLET | Freq: Every day | ORAL | Status: DC
  Administered 2023-08-20 – 2023-08-22 (×3): 40 mg via ORAL
  Filled 2023-08-20 (×3): qty 2

## 2023-08-20 NOTE — Progress Notes (Signed)
Post Partum Day #1 Subjective: no complaints, up ad lib, and tolerating PO; thinks she just wants to go with bottlefeeding; still contemplating contraceptive plans; receiving Lasix 40mg  every day for cHTN as well as ILD; denies SOB/chest pain  Objective: Blood pressure 109/80, pulse 83, temperature 98 F (36.7 C), temperature source Oral, resp. rate 16, height 5\' 3"  (1.6 m), weight 87.8 kg, last menstrual period 12/05/2022, SpO2 97%, unknown if currently breastfeeding.  Physical Exam:  General: alert, cooperative, and no distress Lochia: appropriate Uterine Fundus: firm DVT Evaluation: No evidence of DVT seen on physical exam.  Recent Labs    08/18/23 2340 08/19/23 0950  HGB 11.6* 12.0  HCT 35.3* 36.6    Assessment/Plan: Pt requesting possible d/c on PPD#3 as she remembers that being the day with her 2018 delivery when the SOB/CP began (per chart review, it was on PPD#5 when she presented to MAU with these complaints and was admitted to [facility] with d/c 11/1)  We will discuss these concerns at rounds, but will continue daily Lasix 40mg  in the meantime as well as close monitoring of symtpoms   LOS: 2 days   Arabella Merles, CNM 08/20/2023, 5:47 PM

## 2023-08-20 NOTE — Anesthesia Postprocedure Evaluation (Signed)
Anesthesia Post Note  Patient: Kayla [name]  Procedure(s) Performed: AN AD HOC LABOR EPIDURAL     Patient location during evaluation: Mother Baby Anesthesia Type: Epidural Level of consciousness: awake and alert Pain management: pain level controlled Vital Signs Assessment: post-procedure vital signs reviewed and stable Respiratory status: spontaneous breathing, nonlabored ventilation and respiratory function stable Cardiovascular status: stable Postop Assessment: no headache, no backache and epidural receding Anesthetic complications: no   No notable events documented.  Last Vitals:  Vitals:   08/20/23 0230 08/20/23 0513  BP: 125/72 (!) 115/56  Pulse: 72 73  Resp: 18 18  Temp: 36.7 C 36.8 C  SpO2: 99% 99%    Last Pain:  Vitals:   08/20/23 0613  TempSrc:   PainSc: Asleep   Pain Goal:                   Mauricia Area

## 2023-08-20 NOTE — Lactation Note (Signed)
This note was copied from a baby's chart. Lactation Consultation Note  Patient Name: Kayla [name] [id number] Date: 08/20/2023 Age:33 hours Reason for consult: Follow-up assessment;1st time breastfeeding;Early term 37-38.6wks  Visited P2 parent of ETI. Mother declined lactation services, as she has chosen to formula feed. LC reviewed breast care instructions and discharge education for when mom goes home.  Feeding Mother's Current Feeding Choice: Formula Nipple Type: Slow - flow  Interventions Interventions: Education;Pace feeding  Discharge Discharge Education: Engorgement and breast care;Warning signs for feeding baby;Outpatient recommendation  Consult Status Consult Status: Complete Date: 08/20/23    Antionette Char 08/20/2023, 12:01 PM

## 2023-08-20 NOTE — Progress Notes (Signed)
MOB was referred for history of depression. * Referral screened out by Clinical Social Worker because none of the following criteria appear to apply: ~ History of depression during this pregnancy, or of post-partum depression following prior delivery. No concerns noted in prenatal records.  ~ Diagnosis of depression within last 3 years. Per chart review, MOB's depression dates back to 2017. OR * MOB's symptoms currently being treated with medication and/or therapy.  Please contact the Clinical Social Worker if needs arise, by [facility] request, or if MOB scores greater than 9/yes to question 10 on Edinburgh Postpartum Depression Screen.  Celso Sickle, LCSW Clinical Social Worker [facility] Cell#: [phone]

## 2023-08-20 NOTE — Progress Notes (Signed)
Patient complains of slight headache and states she had some blood noted earlier when she wiped her nose she states it might have been from the oxygen she wore yesterday. She does not feel dizzy or complain of any more symptoms. I told her to call if her headache got worse. I medicated her. No blurred vision noted.

## 2023-08-20 NOTE — Lactation Note (Addendum)
This note was copied from a baby's chart. Lactation Consultation Note  Patient Name: Kayla [name] [id number] Date: 08/20/2023 Age:33 hours Reason for consult: Initial assessment;Early term 37-38.6wks.  Per Birth Parent, infant does latch well, Birth Parent has BF infant twice has been offering infant formula Birth Parent feels  she did not have colostrum.LC discussed the law of supply and demand, suggested Birth Parent latch infant 1st every feeding ,the stimulation of infant sucking at the breast will help stimulate and establish her milk supply, afterwards supplement infant with formula since this is her feeding choice. LC gave handout "Supplementation with Breastfeeding ". LC did not observe latch due infant receiving 25 mls of formula at 12 am.  LC discussed importance of maternal rest, diet and hydration. Birth Parent was  made aware of O/P services, breastfeeding support groups, community resources, and our phone # for post-discharge questions.    Current feeding plan: 1- Latch infant 1st for every feeding, by cues, on demand, every 2-3 hours, skin to skin. 2- Afterwards Birth Parent's feeding choice then supplement infant with formula handout given. 3- Ask RN/LC for latch assistance if needed.  LC sent referral for [facility].   Maternal Data    Feeding Mother's Current Feeding Choice: Breast Milk and Formula Nipple Type: Slow - flow  LATCH Score  LC did not observe latch due to infant receiving 25 mls of formula at 1200 am prior to [facility] entering the room.                  Lactation Tools Discussed/Used    Interventions Interventions: Breast feeding basics reviewed;Skin to skin;Education  Discharge Pump:  (LC sent referral for [facility])  Consult Status Consult Status: Follow-up Date: 08/20/23 Follow-up type: In-patient    Frederico Hamman 08/20/2023, 1:05 AM

## 2023-08-21 MED ORDER — INFLUENZA VIRUS VACC SPLIT PF (FLUZONE) 0.5 ML IM SUSY
0.5000 mL | PREFILLED_SYRINGE | INTRAMUSCULAR | Status: AC
  Administered 2023-08-21: 0.5 mL via INTRAMUSCULAR
  Filled 2023-08-21: qty 0.5

## 2023-08-21 MED ORDER — ASPIRIN-ACETAMINOPHEN-CAFFEINE 250-250-65 MG PO TABS
2.0000 | ORAL_TABLET | Freq: Once | ORAL | Status: AC
  Administered 2023-08-21: 2 via ORAL
  Filled 2023-08-21: qty 2

## 2023-08-21 NOTE — Progress Notes (Signed)
POSTPARTUM PROGRESS NOTE  Post Partum Day 2  Subjective:  Kayla [name] is a 32 y.o. [id number] s/p SVD at [redacted]w[redacted]d.  She reports she is doing well. No acute events overnight. She denies any problems with ambulating, voiding or po intake. Denies nausea or vomiting.  Pain is well controlled.  Lochia is appropriate.  Objective: Blood pressure 111/70, pulse 72, temperature 98.2 F (36.8 C), temperature source Oral, resp. rate 18, height 5\' 3"  (1.6 m), weight 87.8 kg, last menstrual period 12/05/2022, SpO2 99%, unknown if currently breastfeeding.  Physical Exam:  General: alert, cooperative and no distress Chest: no respiratory distress Heart:regular rate, distal pulses intact Uterine Fundus: firm, appropriately tender DVT Evaluation: No calf swelling or tenderness Extremities: mild edema Skin: warm, dry  Recent Labs    08/18/23 2340 08/19/23 0950  HGB 11.6* 12.0  HCT 35.3* 36.6    Assessment/Plan: Shawny Borkowski is a 32 y.o. [name] s/p SVD at [redacted]w[redacted]d   PPD#1 - Doing well  Routine postpartum care  Contraception: Undecided Feeding: pumping and bottle Dispo: Plan for discharge 10/1, strongly offered discharge today as pain is well controlled and patient is ambulating well, declined due to fear of recurrence of ILD exacerbation like her last delivery. Strongly feels that today she will better about going home.   LOS: 3 days   [name], MD OB Fellow  08/21/2023, 9:53 AM

## 2023-08-22 ENCOUNTER — Other Ambulatory Visit (HOSPITAL_COMMUNITY): Payer: Self-pay

## 2023-08-22 MED ORDER — FUROSEMIDE 40 MG PO TABS
40.0000 mg | ORAL_TABLET | Freq: Every day | ORAL | 0 refills | Status: DC
  Filled 2023-08-22: qty 5, 5d supply, fill #0

## 2023-08-22 MED ORDER — IBUPROFEN 600 MG PO TABS
600.0000 mg | ORAL_TABLET | Freq: Four times a day (QID) | ORAL | 0 refills | Status: DC
  Filled 2023-08-22: qty 100, 25d supply, fill #0

## 2023-08-22 MED ORDER — SENNOSIDES-DOCUSATE SODIUM 8.6-50 MG PO TABS
2.0000 | ORAL_TABLET | Freq: Every evening | ORAL | 0 refills | Status: DC | PRN
  Filled 2023-08-22: qty 100, 50d supply, fill #0

## 2023-08-24 ENCOUNTER — Encounter (HOSPITAL_COMMUNITY): Payer: Self-pay | Admitting: Family Medicine

## 2023-08-24 ENCOUNTER — Telehealth: Payer: Self-pay | Admitting: Lactation Services

## 2023-08-24 ENCOUNTER — Other Ambulatory Visit: Payer: Self-pay

## 2023-08-24 ENCOUNTER — Ambulatory Visit: Payer: Medicare HMO | Attending: Cardiology | Admitting: Cardiology

## 2023-08-24 ENCOUNTER — Inpatient Hospital Stay (HOSPITAL_COMMUNITY)
Admission: AD | Admit: 2023-08-24 | Discharge: 2023-08-26 | DRG: 776 | Disposition: A | Discharge status: Home / Self Care | Payer: Medicare HMO | Attending: Family Medicine | Admitting: Family Medicine

## 2023-08-24 ENCOUNTER — Inpatient Hospital Stay (HOSPITAL_COMMUNITY): Payer: Medicare HMO

## 2023-08-24 DIAGNOSIS — Z1152 Encounter for screening for COVID-19: Secondary | ICD-10-CM

## 2023-08-24 DIAGNOSIS — O1410 Severe pre-eclampsia, unspecified trimester: Principal | ICD-10-CM | POA: Diagnosis present

## 2023-08-24 DIAGNOSIS — Z885 Allergy status to narcotic agent status: Secondary | ICD-10-CM

## 2023-08-24 DIAGNOSIS — Z87891 Personal history of nicotine dependence: Secondary | ICD-10-CM

## 2023-08-24 DIAGNOSIS — J810 Acute pulmonary edema: Secondary | ICD-10-CM | POA: Diagnosis present

## 2023-08-24 DIAGNOSIS — O1415 Severe pre-eclampsia, complicating the puerperium: Secondary | ICD-10-CM | POA: Diagnosis present

## 2023-08-24 DIAGNOSIS — Z881 Allergy status to other antibiotic agents status: Secondary | ICD-10-CM

## 2023-08-24 DIAGNOSIS — Z79899 Other long term (current) drug therapy: Secondary | ICD-10-CM

## 2023-08-24 DIAGNOSIS — O0990 Supervision of high risk pregnancy, unspecified, unspecified trimester: Secondary | ICD-10-CM

## 2023-08-24 DIAGNOSIS — R030 Elevated blood-pressure reading, without diagnosis of hypertension: Secondary | ICD-10-CM | POA: Diagnosis present

## 2023-08-24 LAB — COMPREHENSIVE METABOLIC PANEL
ALT: 24 U/L (ref 0–44)
AST: 27 U/L (ref 15–41)
Albumin: 3.1 g/dL — ABNORMAL LOW (ref 3.5–5.0)
Alkaline Phosphatase: 78 U/L (ref 38–126)
Anion gap: 10 (ref 5–15)
BUN: 9 mg/dL (ref 6–20)
CO2: 25 mmol/L (ref 22–32)
Calcium: 8.8 mg/dL — ABNORMAL LOW (ref 8.9–10.3)
Chloride: 102 mmol/L (ref 98–111)
Creatinine, Ser: 0.62 mg/dL (ref 0.44–1.00)
GFR, Estimated: >60 mL/min (ref >60)
Glucose, Bld: 89 mg/dL (ref 70–99)
Potassium: 3.6 mmol/L (ref 3.5–5.1)
Sodium: 137 mmol/L (ref 135–145)
Total Bilirubin: 0.8 mg/dL (ref 0.3–1.2)
Total Protein: 6.3 g/dL — ABNORMAL LOW (ref 6.5–8.1)

## 2023-08-24 LAB — CBC WITH DIFFERENTIAL/PLATELET
Abs Immature Granulocytes: 0.04 10*3/uL (ref 0.00–0.07)
Basophils Absolute: 0 10*3/uL (ref 0.0–0.1)
Basophils Relative: 0 %
Eosinophils Absolute: 0.1 10*3/uL (ref 0.0–0.5)
Eosinophils Relative: 1 %
HCT: 39.4 % (ref 36.0–46.0)
Hemoglobin: 12.5 g/dL (ref 12.0–15.0)
Immature Granulocytes: 1 %
Lymphocytes Relative: 32 %
Lymphs Abs: 2.3 10*3/uL (ref 0.7–4.0)
MCH: 27.1 pg (ref 26.0–34.0)
MCHC: 31.7 g/dL (ref 30.0–36.0)
MCV: 85.3 fL (ref 80.0–100.0)
Monocytes Absolute: 0.7 10*3/uL (ref 0.1–1.0)
Monocytes Relative: 10 %
Neutro Abs: 3.9 10*3/uL (ref 1.7–7.7)
Neutrophils Relative %: 56 %
Platelets: 204 10*3/uL (ref 150–400)
RBC: 4.62 MIL/uL (ref 3.87–5.11)
RDW: 13 % (ref 11.5–15.5)
WBC: 7 10*3/uL (ref 4.0–10.5)
nRBC: 0 % (ref 0.0–0.2)

## 2023-08-24 MED ORDER — MAGNESIUM SULFATE 40 GM/1000ML IV SOLN: 2.0000 g/h | INTRAVENOUS | Status: DC

## 2023-08-24 MED ORDER — WITCH HAZEL-GLYCERIN EX PADS: 1.0000 | MEDICATED_PAD | CUTANEOUS | Status: DC | PRN

## 2023-08-24 MED ORDER — NIFEDIPINE 10 MG PO CAPS
10.0000 mg | ORAL_CAPSULE | ORAL | Status: DC | PRN
  Administered 2023-08-24: 10 mg via ORAL
  Filled 2023-08-24: qty 1

## 2023-08-24 MED ORDER — COCONUT OIL OIL: 1.0000 | TOPICAL_OIL | Status: DC | PRN

## 2023-08-24 MED ORDER — CYCLOBENZAPRINE HCL 5 MG PO TABS
10.0000 mg | ORAL_TABLET | Freq: Once | ORAL | Status: AC
  Administered 2023-08-24: 10 mg via ORAL
  Filled 2023-08-24: qty 2

## 2023-08-24 MED ORDER — DIPHENHYDRAMINE HCL 25 MG PO CAPS
25.0000 mg | ORAL_CAPSULE | Freq: Four times a day (QID) | ORAL | Status: DC | PRN
  Filled 2023-08-24: qty 1

## 2023-08-24 MED ORDER — OXYCODONE HCL 5 MG PO TABS
10.0000 mg | ORAL_TABLET | Freq: Once | ORAL | Status: AC
  Administered 2023-08-24: 10 mg via ORAL
  Filled 2023-08-24: qty 2

## 2023-08-24 MED ORDER — HYDRALAZINE HCL 20 MG/ML IJ SOLN: 5.0000 mg | INTRAMUSCULAR | Status: DC | PRN

## 2023-08-24 MED ORDER — NIFEDIPINE ER OSMOTIC RELEASE 30 MG PO TB24
30.0000 mg | ORAL_TABLET | Freq: Every day | ORAL | Status: DC
  Administered 2023-08-25 – 2023-08-26 (×2): 30 mg via ORAL
  Filled 2023-08-24 (×2): qty 1

## 2023-08-24 MED ORDER — PANTOPRAZOLE SODIUM 40 MG PO TBEC
40.0000 mg | DELAYED_RELEASE_TABLET | Freq: Two times a day (BID) | ORAL | Status: DC
  Administered 2023-08-25 – 2023-08-26 (×3): 40 mg via ORAL
  Filled 2023-08-24 (×3): qty 1

## 2023-08-24 MED ORDER — BENZOCAINE-MENTHOL 20-0.5 % EX AERO: 1.0000 | INHALATION_SPRAY | CUTANEOUS | Status: DC | PRN

## 2023-08-24 MED ORDER — PREDNISONE 5 MG PO TABS
5.0000 mg | ORAL_TABLET | Freq: Every day | ORAL | Status: DC
  Administered 2023-08-25 – 2023-08-26 (×2): 5 mg via ORAL
  Filled 2023-08-24 (×2): qty 1

## 2023-08-24 MED ORDER — ONDANSETRON HCL 4 MG PO TABS: 4.0000 mg | ORAL_TABLET | ORAL | Status: DC | PRN

## 2023-08-24 MED ORDER — SIMETHICONE 80 MG PO CHEW: 80.0000 mg | CHEWABLE_TABLET | ORAL | Status: DC | PRN

## 2023-08-24 MED ORDER — SENNOSIDES-DOCUSATE SODIUM 8.6-50 MG PO TABS: 2.0000 | ORAL_TABLET | Freq: Every evening | ORAL | Status: DC | PRN

## 2023-08-24 MED ORDER — LABETALOL HCL 5 MG/ML IV SOLN: 40.0000 mg | INTRAVENOUS | Status: DC | PRN

## 2023-08-24 MED ORDER — MAGNESIUM SULFATE 40 GM/1000ML IV SOLN
1.0000 g/h | INTRAVENOUS | Status: AC
  Filled 2023-08-24: qty 1000

## 2023-08-24 MED ORDER — AZATHIOPRINE 50 MG PO TABS
50.0000 mg | ORAL_TABLET | Freq: Every day | ORAL | Status: DC
  Administered 2023-08-25: 50 mg via ORAL
  Filled 2023-08-24: qty 1

## 2023-08-24 MED ORDER — LABETALOL HCL 5 MG/ML IV SOLN: 20.0000 mg | INTRAVENOUS | Status: DC | PRN

## 2023-08-24 MED ORDER — OXYCODONE HCL 5 MG PO TABS
10.0000 mg | ORAL_TABLET | Freq: Three times a day (TID) | ORAL | Status: AC | PRN
  Administered 2023-08-25 (×2): 10 mg via ORAL
  Filled 2023-08-24 (×2): qty 2

## 2023-08-24 MED ORDER — PRENATAL 28-0.8 MG PO TABS: 1.0000 | ORAL_TABLET | Freq: Every day | ORAL | Status: DC

## 2023-08-24 MED ORDER — IBUPROFEN 600 MG PO TABS
600.0000 mg | ORAL_TABLET | Freq: Four times a day (QID) | ORAL | Status: DC | PRN
  Administered 2023-08-25: 600 mg via ORAL
  Filled 2023-08-24 (×2): qty 1

## 2023-08-24 MED ORDER — CYCLOBENZAPRINE HCL 5 MG PO TABS: 10.0000 mg | ORAL_TABLET | Freq: Two times a day (BID) | ORAL | Status: DC | PRN

## 2023-08-24 MED ORDER — DOCUSATE SODIUM 100 MG PO CAPS: 100.0000 mg | ORAL_CAPSULE | Freq: Two times a day (BID) | ORAL | Status: DC | PRN

## 2023-08-24 MED ORDER — ONDANSETRON HCL 4 MG/2ML IJ SOLN: 4.0000 mg | INTRAMUSCULAR | Status: DC | PRN

## 2023-08-24 MED ORDER — ACETAMINOPHEN-CAFFEINE 500-65 MG PO TABS
1.0000 | ORAL_TABLET | Freq: Once | ORAL | Status: AC
  Administered 2023-08-24: 1 via ORAL
  Filled 2023-08-24: qty 1

## 2023-08-24 MED ORDER — NIFEDIPINE 10 MG PO CAPS: 20.0000 mg | ORAL_CAPSULE | ORAL | Status: DC | PRN

## 2023-08-24 MED ORDER — DIBUCAINE (PERIANAL) 1 % EX OINT: 1.0000 | TOPICAL_OINTMENT | CUTANEOUS | Status: DC | PRN

## 2023-08-24 MED ORDER — HYDRALAZINE HCL 20 MG/ML IJ SOLN: 10.0000 mg | INTRAMUSCULAR | Status: DC | PRN

## 2023-08-24 MED ORDER — MAGNESIUM SULFATE BOLUS VIA INFUSION
4.0000 g | Freq: Once | INTRAVENOUS | Status: AC
  Administered 2023-08-25: 4 g via INTRAVENOUS
  Filled 2023-08-24: qty 1000

## 2023-08-24 MED ORDER — ENOXAPARIN SODIUM 40 MG/0.4ML IJ SOSY
40.0000 mg | PREFILLED_SYRINGE | INTRAMUSCULAR | Status: DC
  Administered 2023-08-25: 40 mg via SUBCUTANEOUS
  Filled 2023-08-24 (×2): qty 0.4

## 2023-08-24 MED ORDER — ACETAMINOPHEN 325 MG PO TABS: 650.0000 mg | ORAL_TABLET | ORAL | Status: DC | PRN

## 2023-08-24 MED ORDER — HYDROXYCHLOROQUINE SULFATE 200 MG PO TABS
200.0000 mg | ORAL_TABLET | Freq: Two times a day (BID) | ORAL | Status: DC
  Administered 2023-08-25 – 2023-08-26 (×3): 200 mg via ORAL
  Filled 2023-08-24 (×4): qty 1

## 2023-08-24 MED ORDER — LACTATED RINGERS IV SOLN: INTRAVENOUS | Status: DC

## 2023-08-24 MED ORDER — FUROSEMIDE 10 MG/ML IJ SOLN
20.0000 mg | Freq: Once | INTRAMUSCULAR | Status: AC
  Administered 2023-08-25: 20 mg via INTRAVENOUS
  Filled 2023-08-24 (×2): qty 2

## 2023-08-24 NOTE — H&P (Signed)
Kayla [name] , a  32 y.o. who is 6 days postpartum s/p a SVD presents to MAU for high blood pressure concerns. She reports a headache that developed early this morning around 5am that she reports felt unusual. She reports elevated blood pressures with a SBP in the 130s. History of hypertension requiring medication, but reports she does not recall the medication, although she has not had any issues with her blood pressure in 4 years and has not required medication since. She rates HA pain an 8/10 unrelieved by 1000mg  Tylenol that she took x1 prior to arrival to MAU. Last dose at 3pm.  She also reports upper back pain that radiates to her left axilla. She reports as (her normal pain with Lupus). She denies RUQ pain, SOB or visual changes. She denies any noticeable swelling nor lower leg pain or discoloration. She also denies N/V. She states she decided to come in to be evaluated due to her history of eclampsia.  Patient reports that she developed PreE in a previous pregnancy that lead to her being in a coma.    History     CSN: [id number]  Arrival date and time: 08/24/23 1757   None     Chief Complaint  Patient presents with   Hypertension   Headache   Hypertension Associated symptoms include headaches. Pertinent negatives include no chest pain, palpitations or shortness of breath.  Headache  Associated symptoms include back pain. Pertinent negatives include no abdominal pain, eye pain, fever, nausea, photophobia, seizures, vomiting or weakness. Her past medical history is significant for hypertension.    OB History     Gravida  5   Para  3   Term  2   Preterm  1   AB  2   Living  2      SAB  2   IAB  0   Ectopic  0   Multiple  0   Live Births  2           Past Medical History:  Diagnosis Date   Acute respiratory failure with hypoxemia (HCC) 09/19/2017   Acute respiratory failure with hypoxia (HCC)    Arthritis    "hands and legs" (01/08/2015)   CAP  (community acquired pneumonia) 01/07/2015   Chronic Respiratory failure with oxygen requirement affecting pregnancy, antepartum 05/16/2016   Resolved pulmonary HTN on 2018 echo (in our epic).   Pt off O2 in 2018.   Daily headache    "sometimes" (01/08/2015)   Depression    2017   Gastroesophageal reflux disease without esophagitis 07/13/2020   GERD (gastroesophageal reflux disease)    Hypertension    No longer takes meds   ILD (interstitial lung disease) (HCC)    Insomnia 08/21/2015   Loud P2 (pulmonary S2, second heart sound) 08/21/2015   Lung disease    Lupus    Lupus (systemic lupus erythematosus) (HCC) 08/21/2015   Multifocal pneumonia    Pulmonary hypertension (HCC)    Seasonal allergies 03/02/2023   Sjogren's syndrome (HCC)    SS-A antibody positive 06/02/2016   SS-B antibody positive 06/02/2016   STD (sexually transmitted disease)    Chlamydia   Vaginal Pap smear, abnormal    repeats have been ok   Viral illness 03/02/2023    Past Surgical History:  Procedure Laterality Date   CARDIAC CATHETERIZATION N/A 09/09/2015   Procedure: Right Heart Cath;  Surgeon: Laurey Morale, MD;  Location: [facility] INVASIVE CV LAB;  Service: Cardiovascular;  Laterality: N/A;  DILATION AND EVACUATION N/A 07/13/2015   Procedure: DILATATION AND EVACUATION;  Surgeon: Levie Heritage, DO;  Location: [facility] ORS;  Service: Gynecology;  Laterality: N/A;   FINGER SURGERY Right 03/2014   "laceration, nerve/artery injury" 2nd digit   VIDEO BRONCHOSCOPY Bilateral 01/12/2015   Procedure: VIDEO BRONCHOSCOPY WITH FLUORO;  Surgeon: Leslye Peer, MD;  Location: [facility] ENDOSCOPY;  Service: Cardiopulmonary;  Laterality: Bilateral;    Family History  Problem Relation Age of Onset   Hypertension Mother    Deep vein thrombosis Mother    Arthritis Father    Cancer Brother        Found in jaw area   Diabetes Maternal Aunt    Stroke Maternal Grandmother    Hypertension Maternal Grandmother    Diabetes Maternal  Grandmother    Hypertension Maternal Grandfather     Social History   Tobacco Use   Smoking status: Former    Current packs/day: 0.00    Average packs/day: 0.1 packs/day for 5.0 years (0.5 ttl pk-yrs)    Types: Cigarettes    Start date: 11/20/2009    Quit date: 11/20/2014    Years since quitting: 8.7   Smokeless tobacco: Never  Vaping Use   Vaping status: Never Used  Substance Use Topics   Alcohol use: Not Currently    Comment: Occas   Drug use: No    Allergies:  Allergies  Allergen Reactions   Hydrocodone Nausea And Vomiting   Zithromax [Azithromycin] Itching and Cough    Medications Prior to Admission  Medication Sig Dispense Refill Last Dose   furosemide (LASIX) 40 MG tablet Take 1 tablet (40 mg total) by mouth daily. 5 tablet 0 08/24/2023 at 0800   hydroxychloroquine (PLAQUENIL) 200 MG tablet Take 200 mg by mouth 2 (two) times daily.   08/24/2023 at 0800   predniSONE (DELTASONE) 5 MG tablet Take 5 mg by mouth daily with breakfast.   08/24/2023   Prenatal 28-0.8 MG TABS Take 1 tablet by mouth daily. 30 tablet 12 08/24/2023 at 0800   acetaminophen (TYLENOL) 500 MG tablet Take 1,000 mg by mouth every 6 (six) hours as needed.      azaTHIOprine (IMURAN) 50 MG tablet Take 50 mg by mouth daily.      cyclobenzaprine (FLEXERIL) 10 MG tablet Take 1 tablet (10 mg total) by mouth 2 (two) times daily as needed for muscle spasms. 20 tablet 0    ibuprofen (ADVIL) 600 MG tablet Take 1 tablet (600 mg total) by mouth every 6 (six) hours. 100 tablet 0    ipratropium (ATROVENT HFA) 17 MCG/ACT inhaler Inhale 2 puffs into the lungs every 6 (six) hours as needed for wheezing. 1 each 12    nystatin-triamcinolone (MYCOLOG II) cream Apply to affected area daily 15 g 0    ondansetron (ZOFRAN-ODT) 4 MG disintegrating tablet Take 1 tablet (4 mg total) by mouth every 6 (six) hours as needed for nausea. 20 tablet 1    pantoprazole (PROTONIX) 40 MG tablet Take 1 tablet (40 mg total) by mouth 2 (two) times  daily before a meal. 60 tablet 5    senna-docusate (SENOKOT-S) 8.6-50 MG tablet Take 2 tablets by mouth at bedtime as needed for mild constipation. 100 tablet 0   .results  Review of Systems  Constitutional:  Negative for chills, fatigue and fever.  Eyes:  Negative for photophobia, pain and visual disturbance.  Respiratory:  Negative for apnea, shortness of breath and wheezing.   Cardiovascular:  Negative for chest pain  and palpitations.  Gastrointestinal:  Negative for abdominal pain, constipation, diarrhea, nausea and vomiting.  Genitourinary:  Negative for difficulty urinating, dysuria, pelvic pain, vaginal bleeding, vaginal discharge and vaginal pain.  Musculoskeletal:  Positive for back pain.  Neurological:  Positive for headaches. Negative for seizures and weakness.  Psychiatric/Behavioral:  Negative for suicidal ideas.    Physical Exam   Blood pressure 111/69, pulse 75, temperature 98.5 F (36.9 C), temperature source Oral, SpO2 98%, currently breastfeeding.  Physical Exam Vitals and nursing note reviewed.  Constitutional:      General: She is not in acute distress.    Appearance: Normal appearance. She is not ill-appearing.  HENT:     Head: Normocephalic.  Pulmonary:     Effort: Pulmonary effort is normal.  Musculoskeletal:        General: No swelling. Normal range of motion.     Cervical back: Normal range of motion.  Skin:    General: Skin is warm and dry.  Neurological:     Mental Status: She is alert and oriented to person, place, and time.     GCS: GCS eye subscore is 4. GCS verbal subscore is 5. GCS motor subscore is 6.     Sensory: No sensory deficit.     Gait: Gait normal.     Deep Tendon Reflexes: Reflexes normal.  Psychiatric:        Mood and Affect: Mood is anxious.        Behavior: Behavior normal.     MAU Course  Procedures Orders Placed This Encounter  Procedures   CBC with Differential/Platelet   Comprehensive metabolic panel   Notify  physician (specify) Confirmatory reading of BP> 160/110 15 minutes later   Apply Hypertensive Disorders of Pregnancy Care Plan   Measure blood pressure   Results for orders placed or performed during the hospital encounter of 08/24/23 (from the past 24 hour(s))  CBC with Differential/Platelet     Status: None   Collection Time: 08/24/23  7:34 PM  Result Value Ref Range   WBC 7.0 4.0 - 10.5 K/uL   RBC 4.62 3.87 - 5.11 MIL/uL   Hemoglobin 12.5 12.0 - 15.0 g/dL   HCT 19.1 47.8 - 29.5 %   MCV 85.3 80.0 - 100.0 fL   MCH 27.1 26.0 - 34.0 pg   MCHC 31.7 30.0 - 36.0 g/dL   RDW 62.1 30.8 - 65.7 %   Platelets 204 150 - 400 K/uL   nRBC 0.0 0.0 - 0.2 %   Neutrophils Relative % 56 %   Neutro Abs 3.9 1.7 - 7.7 K/uL   Lymphocytes Relative 32 %   Lymphs Abs 2.3 0.7 - 4.0 K/uL   Monocytes Relative 10 %   Monocytes Absolute 0.7 0.1 - 1.0 K/uL   Eosinophils Relative 1 %   Eosinophils Absolute 0.1 0.0 - 0.5 K/uL   Basophils Relative 0 %   Basophils Absolute 0.0 0.0 - 0.1 K/uL   Immature Granulocytes 1 %   Abs Immature Granulocytes 0.04 0.00 - 0.07 K/uL  Comprehensive metabolic panel     Status: Abnormal   Collection Time: 08/24/23  7:34 PM  Result Value Ref Range   Sodium 137 135 - 145 mmol/L   Potassium 3.6 3.5 - 5.1 mmol/L   Chloride 102 98 - 111 mmol/L   CO2 25 22 - 32 mmol/L   Glucose, Bld 89 70 - 99 mg/dL   BUN 9 6 - 20 mg/dL   Creatinine, Ser 8.46 0.44 -  1.00 mg/dL   Calcium 8.8 (L) 8.9 - 10.3 mg/dL   Total Protein 6.3 (L) 6.5 - 8.1 g/dL   Albumin 3.1 (L) 3.5 - 5.0 g/dL   AST 27 15 - 41 U/L   ALT 24 0 - 44 U/L   Alkaline Phosphatase 78 38 - 126 U/L   Total Bilirubin 0.8 0.3 - 1.2 mg/dL   GFR, Estimated >16 >10 mL/min   Anion gap 10 5 - 15   Patient Vitals for the past 24 hrs:  BP Temp Temp src Pulse SpO2  08/24/23 2033 111/69 -- -- 75 --  08/24/23 2017 (!) 99/56 -- -- 80 --  08/24/23 2001 102/63 -- -- 80 98 %  08/24/23 1948 102/66 -- -- 87 94 %  08/24/23 1935 99/67 -- -- 81  --  08/24/23 1932 (!) 87/58 -- -- 84 --  08/24/23 1913 (!) 157/95 -- -- 61 --  08/24/23 1847 (!) 167/97 -- -- (!) 58 --  08/24/23 1832 (!) 168/95 -- -- (!) 58 --  08/24/23 1828 (!) 158/97 98.5 F (36.9 C) Oral 63 --    MDM -Patient with severe blood pressures x2 in MAU. Treated with 1 PO dose of 10mg  Procardia.  -PreE labs collected.  - PreE labs normal  - Flexeril ordered & administered for pain management.  - @ 2000 Headache unrelieved, PO Excedrin ordered given now low suspicion for PreE.  - Blood pressures improved after one dose of Procardia and now WNL.  - Transfer of care to M. Olamide Carattini at 2000.  Shantonette Danella Deis) Suzie Portela, MSN, CNM  Center for [facility]  08/24/2023 8:50 PM     Assumed care:  Patient c/o feeling "crackling" when she breaths, feels like she had fluid in skin of her back.  + productive cough, + crackles, no skin fluid collection CXR ordered:  +pulmonary edema  Headache somewhat improved after 2 meds but not resolved  Results for orders placed or performed during the hospital encounter of 08/24/23 (from the past 24 hour(s))  CBC with Differential/Platelet     Status: None   Collection Time: 08/24/23  7:34 PM  Result Value Ref Range   WBC 7.0 4.0 - 10.5 K/uL   RBC 4.62 3.87 - 5.11 MIL/uL   Hemoglobin 12.5 12.0 - 15.0 g/dL   HCT 96.0 45.4 - 09.8 %   MCV 85.3 80.0 - 100.0 fL   MCH 27.1 26.0 - 34.0 pg   MCHC 31.7 30.0 - 36.0 g/dL   RDW 11.9 14.7 - 82.9 %   Platelets 204 150 - 400 K/uL   nRBC 0.0 0.0 - 0.2 %   Neutrophils Relative % 56 %   Neutro Abs 3.9 1.7 - 7.7 K/uL   Lymphocytes Relative 32 %   Lymphs Abs 2.3 0.7 - 4.0 K/uL   Monocytes Relative 10 %   Monocytes Absolute 0.7 0.1 - 1.0 K/uL   Eosinophils Relative 1 %   Eosinophils Absolute 0.1 0.0 - 0.5 K/uL   Basophils Relative 0 %   Basophils Absolute 0.0 0.0 - 0.1 K/uL   Immature Granulocytes 1 %   Abs Immature Granulocytes 0.04 0.00 - 0.07 K/uL  Comprehensive metabolic panel      Status: Abnormal   Collection Time: 08/24/23  7:34 PM  Result Value Ref Range   Sodium 137 135 - 145 mmol/L   Potassium 3.6 3.5 - 5.1 mmol/L   Chloride 102 98 - 111 mmol/L   CO2 25 22 - 32 mmol/L  Glucose, Bld 89 70 - 99 mg/dL   BUN 9 6 - 20 mg/dL   Creatinine, Ser 2.95 0.44 - 1.00 mg/dL   Calcium 8.8 (L) 8.9 - 10.3 mg/dL   Total Protein 6.3 (L) 6.5 - 8.1 g/dL   Albumin 3.1 (L) 3.5 - 5.0 g/dL   AST 27 15 - 41 U/L   ALT 24 0 - 44 U/L   Alkaline Phosphatase 78 38 - 126 U/L   Total Bilirubin 0.8 0.3 - 1.2 mg/dL   GFR, Estimated >62 >13 mL/min   Anion gap 10 5 - 15  Type and screen [facility]     Status: None (Preliminary result)   Collection Time: 08/24/23 11:17 PM  Result Value Ref Range   ABO/RH(D) PENDING    Antibody Screen PENDING    Sample Expiration      08/27/2023,2359 Performed at [facility], [address] [place], [place] [address]    DG CHEST PORT 1 VIEW  Result Date: 08/24/2023 CLINICAL DATA:  Hypertension in pregnancy, preeclampsia, productive cough EXAM: PORTABLE CHEST 1 VIEW COMPARISON:  Chest radiograph 08/18/2023 FINDINGS: Stable cardiomediastinal silhouette. Chronic scarring in the left mid lung. Chronic age advanced interstitial lung disease with architectural distortion and interstitial coarsening. Increased hazy airspace and interstitial opacities bilaterally greatest in the right upper lung. No pleural effusion or pneumothorax. IMPRESSION: Findings favor edema or infection superimposed on a background of chronic interstitial lung disease. Electronically Signed   By: Minerva Fester M.D.   On: 08/24/2023 22:14    Consulted Dr Vergie Living, will admit.   Assessment and Plan  A:  Postpartum Day #5       Postpartum preeclampsia, severe       Pulmonary Edema       Headache, persistent  P    Admit to OBSCU       Magnesium sulfate infusion       Lasix 20mg  IV        Strict I&O        Oxy for headache        MD to follow  Aviva Signs, CNM

## 2023-08-24 NOTE — Telephone Encounter (Signed)
Received message from front office that patient needs assistance with drying up her milk.   Her BP is high and she is planning to go to Maternity Assessment Unit today.   She reports her breasts are really swollen and hurting. She was BF. She reports pumping is too much.   Reviewed ice to breasts, cold crumpled cabbage leaves in bra and change when wilted. Limit stimulation and wear a supportive bra. Reviewed will dry up with time.   Reviewed there are LC's in the hospital that may be able to assist while she is there.

## 2023-08-24 NOTE — MAU Provider Note (Addendum)
Kayla [name] , a  32 y.o. who is 6 days postpartum s/p a SVD presents to MAU for high blood pressure concerns. She reports a headache that developed early this morning around 5am that she reports felt unusual. She reports elevated blood pressures with a SBP in the 130s. History of hypertension requiring medication, but reports she does not recall the medication, although she has not had any issues with her blood pressure in 4 years and has not required medication since. She rates HA pain an 8/10 unrelieved by 1000mg  Tylenol that she took x1 prior to arrival to MAU. Last dose at 3pm.  She also reports upper back pain that radiates to her left axilla. She reports as (her normal pain with Lupus). She denies RUQ pain, SOB or visual changes. She denies any noticeable swelling nor lower leg pain or discoloration. She also denies N/V. She states she decided to come in to be evaluated due to her history of eclampsia.  Patient reports that she developed PreE in a previous pregnancy that lead to her being in a coma.    History     CSN: [id number]  Arrival date and time: 08/24/23 1757   None     Chief Complaint  Patient presents with   Hypertension   Headache   Hypertension Associated symptoms include headaches. Pertinent negatives include no chest pain, palpitations or shortness of breath.  Headache  Associated symptoms include back pain. Pertinent negatives include no abdominal pain, eye pain, fever, nausea, photophobia, seizures, vomiting or weakness. Her past medical history is significant for hypertension.    OB History     Gravida  5   Para  3   Term  2   Preterm  1   AB  2   Living  2      SAB  2   IAB  0   Ectopic  0   Multiple  0   Live Births  2           Past Medical History:  Diagnosis Date   Acute respiratory failure with hypoxemia (HCC) 09/19/2017   Acute respiratory failure with hypoxia (HCC)    Arthritis    "hands and legs" (01/08/2015)   CAP  (community acquired pneumonia) 01/07/2015   Chronic Respiratory failure with oxygen requirement affecting pregnancy, antepartum 05/16/2016   Resolved pulmonary HTN on 2018 echo (in our epic).   Pt off O2 in 2018.   Daily headache    "sometimes" (01/08/2015)   Depression    2017   Gastroesophageal reflux disease without esophagitis 07/13/2020   GERD (gastroesophageal reflux disease)    Hypertension    No longer takes meds   ILD (interstitial lung disease) (HCC)    Insomnia 08/21/2015   Loud P2 (pulmonary S2, second heart sound) 08/21/2015   Lung disease    Lupus    Lupus (systemic lupus erythematosus) (HCC) 08/21/2015   Multifocal pneumonia    Pulmonary hypertension (HCC)    Seasonal allergies 03/02/2023   Sjogren's syndrome (HCC)    SS-A antibody positive 06/02/2016   SS-B antibody positive 06/02/2016   STD (sexually transmitted disease)    Chlamydia   Vaginal Pap smear, abnormal    repeats have been ok   Viral illness 03/02/2023    Past Surgical History:  Procedure Laterality Date   CARDIAC CATHETERIZATION N/A 09/09/2015   Procedure: Right Heart Cath;  Surgeon: Laurey Morale, MD;  Location: [facility] INVASIVE CV LAB;  Service: Cardiovascular;  Laterality: N/A;  DILATION AND EVACUATION N/A 07/13/2015   Procedure: DILATATION AND EVACUATION;  Surgeon: Levie Heritage, DO;  Location: [facility] ORS;  Service: Gynecology;  Laterality: N/A;   FINGER SURGERY Right 03/2014   "laceration, nerve/artery injury" 2nd digit   VIDEO BRONCHOSCOPY Bilateral 01/12/2015   Procedure: VIDEO BRONCHOSCOPY WITH FLUORO;  Surgeon: Leslye Peer, MD;  Location: [facility] ENDOSCOPY;  Service: Cardiopulmonary;  Laterality: Bilateral;    Family History  Problem Relation Age of Onset   Hypertension Mother    Deep vein thrombosis Mother    Arthritis Father    Cancer Brother        Found in jaw area   Diabetes Maternal Aunt    Stroke Maternal Grandmother    Hypertension Maternal Grandmother    Diabetes Maternal  Grandmother    Hypertension Maternal Grandfather     Social History   Tobacco Use   Smoking status: Former    Current packs/day: 0.00    Average packs/day: 0.1 packs/day for 5.0 years (0.5 ttl pk-yrs)    Types: Cigarettes    Start date: 11/20/2009    Quit date: 11/20/2014    Years since quitting: 8.7   Smokeless tobacco: Never  Vaping Use   Vaping status: Never Used  Substance Use Topics   Alcohol use: Not Currently    Comment: Occas   Drug use: No    Allergies:  Allergies  Allergen Reactions   Hydrocodone Nausea And Vomiting   Zithromax [Azithromycin] Itching and Cough    Medications Prior to Admission  Medication Sig Dispense Refill Last Dose   furosemide (LASIX) 40 MG tablet Take 1 tablet (40 mg total) by mouth daily. 5 tablet 0 08/24/2023 at 0800   hydroxychloroquine (PLAQUENIL) 200 MG tablet Take 200 mg by mouth 2 (two) times daily.   08/24/2023 at 0800   predniSONE (DELTASONE) 5 MG tablet Take 5 mg by mouth daily with breakfast.   08/24/2023   Prenatal 28-0.8 MG TABS Take 1 tablet by mouth daily. 30 tablet 12 08/24/2023 at 0800   acetaminophen (TYLENOL) 500 MG tablet Take 1,000 mg by mouth every 6 (six) hours as needed.      azaTHIOprine (IMURAN) 50 MG tablet Take 50 mg by mouth daily.      cyclobenzaprine (FLEXERIL) 10 MG tablet Take 1 tablet (10 mg total) by mouth 2 (two) times daily as needed for muscle spasms. 20 tablet 0    ibuprofen (ADVIL) 600 MG tablet Take 1 tablet (600 mg total) by mouth every 6 (six) hours. 100 tablet 0    ipratropium (ATROVENT HFA) 17 MCG/ACT inhaler Inhale 2 puffs into the lungs every 6 (six) hours as needed for wheezing. 1 each 12    nystatin-triamcinolone (MYCOLOG II) cream Apply to affected area daily 15 g 0    ondansetron (ZOFRAN-ODT) 4 MG disintegrating tablet Take 1 tablet (4 mg total) by mouth every 6 (six) hours as needed for nausea. 20 tablet 1    pantoprazole (PROTONIX) 40 MG tablet Take 1 tablet (40 mg total) by mouth 2 (two) times  daily before a meal. 60 tablet 5    senna-docusate (SENOKOT-S) 8.6-50 MG tablet Take 2 tablets by mouth at bedtime as needed for mild constipation. 100 tablet 0   .results  Review of Systems  Constitutional:  Negative for chills, fatigue and fever.  Eyes:  Negative for photophobia, pain and visual disturbance.  Respiratory:  Negative for apnea, shortness of breath and wheezing.   Cardiovascular:  Negative for chest pain  and palpitations.  Gastrointestinal:  Negative for abdominal pain, constipation, diarrhea, nausea and vomiting.  Genitourinary:  Negative for difficulty urinating, dysuria, pelvic pain, vaginal bleeding, vaginal discharge and vaginal pain.  Musculoskeletal:  Positive for back pain.  Neurological:  Positive for headaches. Negative for seizures and weakness.  Psychiatric/Behavioral:  Negative for suicidal ideas.    Physical Exam   Blood pressure 111/69, pulse 75, temperature 98.5 F (36.9 C), temperature source Oral, SpO2 98%, currently breastfeeding.  Physical Exam Vitals and nursing note reviewed.  Constitutional:      General: She is not in acute distress.    Appearance: Normal appearance. She is not ill-appearing.  HENT:     Head: Normocephalic.  Pulmonary:     Effort: Pulmonary effort is normal.  Musculoskeletal:        General: No swelling. Normal range of motion.     Cervical back: Normal range of motion.  Skin:    General: Skin is warm and dry.  Neurological:     Mental Status: She is alert and oriented to person, place, and time.     GCS: GCS eye subscore is 4. GCS verbal subscore is 5. GCS motor subscore is 6.     Sensory: No sensory deficit.     Gait: Gait normal.     Deep Tendon Reflexes: Reflexes normal.  Psychiatric:        Mood and Affect: Mood is anxious.        Behavior: Behavior normal.     MAU Course  Procedures Orders Placed This Encounter  Procedures   CBC with Differential/Platelet   Comprehensive metabolic panel   Notify  physician (specify) Confirmatory reading of BP> 160/110 15 minutes later   Apply Hypertensive Disorders of Pregnancy Care Plan   Measure blood pressure   Results for orders placed or performed during the hospital encounter of 08/24/23 (from the past 24 hour(s))  CBC with Differential/Platelet     Status: None   Collection Time: 08/24/23  7:34 PM  Result Value Ref Range   WBC 7.0 4.0 - 10.5 K/uL   RBC 4.62 3.87 - 5.11 MIL/uL   Hemoglobin 12.5 12.0 - 15.0 g/dL   HCT 19.1 47.8 - 29.5 %   MCV 85.3 80.0 - 100.0 fL   MCH 27.1 26.0 - 34.0 pg   MCHC 31.7 30.0 - 36.0 g/dL   RDW 62.1 30.8 - 65.7 %   Platelets 204 150 - 400 K/uL   nRBC 0.0 0.0 - 0.2 %   Neutrophils Relative % 56 %   Neutro Abs 3.9 1.7 - 7.7 K/uL   Lymphocytes Relative 32 %   Lymphs Abs 2.3 0.7 - 4.0 K/uL   Monocytes Relative 10 %   Monocytes Absolute 0.7 0.1 - 1.0 K/uL   Eosinophils Relative 1 %   Eosinophils Absolute 0.1 0.0 - 0.5 K/uL   Basophils Relative 0 %   Basophils Absolute 0.0 0.0 - 0.1 K/uL   Immature Granulocytes 1 %   Abs Immature Granulocytes 0.04 0.00 - 0.07 K/uL  Comprehensive metabolic panel     Status: Abnormal   Collection Time: 08/24/23  7:34 PM  Result Value Ref Range   Sodium 137 135 - 145 mmol/L   Potassium 3.6 3.5 - 5.1 mmol/L   Chloride 102 98 - 111 mmol/L   CO2 25 22 - 32 mmol/L   Glucose, Bld 89 70 - 99 mg/dL   BUN 9 6 - 20 mg/dL   Creatinine, Ser 8.46 0.44 -  1.00 mg/dL   Calcium 8.8 (L) 8.9 - 10.3 mg/dL   Total Protein 6.3 (L) 6.5 - 8.1 g/dL   Albumin 3.1 (L) 3.5 - 5.0 g/dL   AST 27 15 - 41 U/L   ALT 24 0 - 44 U/L   Alkaline Phosphatase 78 38 - 126 U/L   Total Bilirubin 0.8 0.3 - 1.2 mg/dL   GFR, Estimated >16 >10 mL/min   Anion gap 10 5 - 15   Patient Vitals for the past 24 hrs:  BP Temp Temp src Pulse SpO2  08/24/23 2033 111/69 -- -- 75 --  08/24/23 2017 (!) 99/56 -- -- 80 --  08/24/23 2001 102/63 -- -- 80 98 %  08/24/23 1948 102/66 -- -- 87 94 %  08/24/23 1935 99/67 -- -- 81  --  08/24/23 1932 (!) 87/58 -- -- 84 --  08/24/23 1913 (!) 157/95 -- -- 61 --  08/24/23 1847 (!) 167/97 -- -- (!) 58 --  08/24/23 1832 (!) 168/95 -- -- (!) 58 --  08/24/23 1828 (!) 158/97 98.5 F (36.9 C) Oral 63 --    MDM -Patient with severe blood pressures x2 in MAU. Treated with 1 PO dose of 10mg  Procardia.  -PreE labs collected.  - PreE labs normal  - Flexeril ordered & administered for pain management.  - @ 2000 Headache unrelieved, PO Excedrin ordered given now low suspicion for PreE.  - Blood pressures improved after one dose of Procardia and now WNL.  - Transfer of care to M. Olamide Carattini at 2000.  Shantonette Danella Deis) Suzie Portela, MSN, CNM  [facility]  08/24/2023 8:50 PM     Assumed care:  Patient c/o feeling "crackling" when she breaths, feels like she had fluid in skin of her back.  + productive cough, + crackles, no skin fluid collection CXR ordered:  +pulmonary edema  Headache somewhat improved after 2 meds but not resolved  Results for orders placed or performed during the hospital encounter of 08/24/23 (from the past 24 hour(s))  CBC with Differential/Platelet     Status: None   Collection Time: 08/24/23  7:34 PM  Result Value Ref Range   WBC 7.0 4.0 - 10.5 K/uL   RBC 4.62 3.87 - 5.11 MIL/uL   Hemoglobin 12.5 12.0 - 15.0 g/dL   HCT 96.0 45.4 - 09.8 %   MCV 85.3 80.0 - 100.0 fL   MCH 27.1 26.0 - 34.0 pg   MCHC 31.7 30.0 - 36.0 g/dL   RDW 11.9 14.7 - 82.9 %   Platelets 204 150 - 400 K/uL   nRBC 0.0 0.0 - 0.2 %   Neutrophils Relative % 56 %   Neutro Abs 3.9 1.7 - 7.7 K/uL   Lymphocytes Relative 32 %   Lymphs Abs 2.3 0.7 - 4.0 K/uL   Monocytes Relative 10 %   Monocytes Absolute 0.7 0.1 - 1.0 K/uL   Eosinophils Relative 1 %   Eosinophils Absolute 0.1 0.0 - 0.5 K/uL   Basophils Relative 0 %   Basophils Absolute 0.0 0.0 - 0.1 K/uL   Immature Granulocytes 1 %   Abs Immature Granulocytes 0.04 0.00 - 0.07 K/uL  Comprehensive metabolic panel      Status: Abnormal   Collection Time: 08/24/23  7:34 PM  Result Value Ref Range   Sodium 137 135 - 145 mmol/L   Potassium 3.6 3.5 - 5.1 mmol/L   Chloride 102 98 - 111 mmol/L   CO2 25 22 - 32 mmol/L  Glucose, Bld 89 70 - 99 mg/dL   BUN 9 6 - 20 mg/dL   Creatinine, Ser 2.95 0.44 - 1.00 mg/dL   Calcium 8.8 (L) 8.9 - 10.3 mg/dL   Total Protein 6.3 (L) 6.5 - 8.1 g/dL   Albumin 3.1 (L) 3.5 - 5.0 g/dL   AST 27 15 - 41 U/L   ALT 24 0 - 44 U/L   Alkaline Phosphatase 78 38 - 126 U/L   Total Bilirubin 0.8 0.3 - 1.2 mg/dL   GFR, Estimated >62 >13 mL/min   Anion gap 10 5 - 15  Type and screen [facility]     Status: None (Preliminary result)   Collection Time: 08/24/23 11:17 PM  Result Value Ref Range   ABO/RH(D) PENDING    Antibody Screen PENDING    Sample Expiration      08/27/2023,2359 Performed at [facility], [address] [place], [place] [address]    DG CHEST PORT 1 VIEW  Result Date: 08/24/2023 CLINICAL DATA:  Hypertension in pregnancy, preeclampsia, productive cough EXAM: PORTABLE CHEST 1 VIEW COMPARISON:  Chest radiograph 08/18/2023 FINDINGS: Stable cardiomediastinal silhouette. Chronic scarring in the left mid lung. Chronic age advanced interstitial lung disease with architectural distortion and interstitial coarsening. Increased hazy airspace and interstitial opacities bilaterally greatest in the right upper lung. No pleural effusion or pneumothorax. IMPRESSION: Findings favor edema or infection superimposed on a background of chronic interstitial lung disease. Electronically Signed   By: Minerva Fester M.D.   On: 08/24/2023 22:14    Consulted Dr Vergie Living, will admit.   Assessment and Plan  A:  Postpartum Day #5       Postpartum preeclampsia, severe       Pulmonary Edema       Headache, persistent  P    Admit to OBSCU       Magnesium sulfate infusion       Lasix 20mg  IV        Strict I&O        Oxy for headache        MD to follow  Aviva Signs, CNM

## 2023-08-24 NOTE — MAU Note (Signed)
Kayla [name] is a 32 y.o. postpartum patient here in MAU reporting: a dull headache that began this morning around 0500, she has been taking her blood pressure throughout the day and reports that they have been high, the latest reading at home was 149/99  Onset of complaint: 0500 Pain score: 8/10 Vitals:   08/24/23 1828  BP: (!) 158/97  Pulse: 63  Temp: 98.5 F (36.9 C)      Lab orders placed from triage:  None

## 2023-08-25 ENCOUNTER — Inpatient Hospital Stay (HOSPITAL_COMMUNITY): Payer: Medicare HMO

## 2023-08-25 DIAGNOSIS — O1415 Severe pre-eclampsia, complicating the puerperium: Principal | ICD-10-CM

## 2023-08-25 LAB — CBC
HCT: 38.3 % (ref 36.0–46.0)
Hemoglobin: 12.3 g/dL (ref 12.0–15.0)
MCH: 28.2 pg (ref 26.0–34.0)
MCHC: 32.1 g/dL (ref 30.0–36.0)
MCV: 87.8 fL (ref 80.0–100.0)
Platelets: UNDETERMINED 10*3/uL (ref 150–400)
RBC: 4.36 MIL/uL (ref 3.87–5.11)
RDW: 12.9 % (ref 11.5–15.5)
WBC: 7.5 10*3/uL (ref 4.0–10.5)
nRBC: 0 % (ref 0.0–0.2)

## 2023-08-25 LAB — COMPREHENSIVE METABOLIC PANEL
ALT: 28 U/L (ref 0–44)
AST: 31 U/L (ref 15–41)
Albumin: 2.9 g/dL — ABNORMAL LOW (ref 3.5–5.0)
Alkaline Phosphatase: 68 U/L (ref 38–126)
Anion gap: 11 (ref 5–15)
BUN: 8 mg/dL (ref 6–20)
CO2: 24 mmol/L (ref 22–32)
Calcium: 8.1 mg/dL — ABNORMAL LOW (ref 8.9–10.3)
Chloride: 101 mmol/L (ref 98–111)
Creatinine, Ser: 0.56 mg/dL (ref 0.44–1.00)
GFR, Estimated: >60 mL/min (ref >60)
Glucose, Bld: 91 mg/dL (ref 70–99)
Potassium: 3.3 mmol/L — ABNORMAL LOW (ref 3.5–5.1)
Sodium: 136 mmol/L (ref 135–145)
Total Bilirubin: 0.5 mg/dL (ref 0.3–1.2)
Total Protein: 6 g/dL — ABNORMAL LOW (ref 6.5–8.1)

## 2023-08-25 LAB — TYPE AND SCREEN
ABO/RH(D): O POS
Antibody Screen: NEGATIVE

## 2023-08-25 LAB — SARS CORONAVIRUS 2 BY RT PCR: SARS Coronavirus 2 by RT PCR: NEGATIVE

## 2023-08-25 LAB — MAGNESIUM
Magnesium: 3.4 mg/dL — ABNORMAL HIGH (ref 1.7–2.4)
Magnesium: 3.6 mg/dL — ABNORMAL HIGH (ref 1.7–2.4)

## 2023-08-25 MED ORDER — FUROSEMIDE 20 MG PO TABS
20.0000 mg | ORAL_TABLET | Freq: Every day | ORAL | Status: AC
  Administered 2023-08-25 – 2023-08-26 (×2): 20 mg via ORAL
  Filled 2023-08-25 (×2): qty 1

## 2023-08-25 MED ORDER — AZATHIOPRINE 50 MG PO TABS
150.0000 mg | ORAL_TABLET | Freq: Every day | ORAL | Status: DC
  Administered 2023-08-26: 150 mg via ORAL
  Filled 2023-08-25: qty 3

## 2023-08-25 MED ORDER — AZATHIOPRINE 50 MG PO TABS
100.0000 mg | ORAL_TABLET | Freq: Once | ORAL | Status: AC
  Administered 2023-08-25: 100 mg via ORAL
  Filled 2023-08-25: qty 2

## 2023-08-25 MED ORDER — METOCLOPRAMIDE HCL 5 MG/ML IJ SOLN
10.0000 mg | Freq: Four times a day (QID) | INTRAMUSCULAR | Status: DC | PRN
  Administered 2023-08-25 – 2023-08-26 (×3): 10 mg via INTRAVENOUS
  Filled 2023-08-25 (×3): qty 2

## 2023-08-25 MED ORDER — DIPHENHYDRAMINE HCL 50 MG/ML IJ SOLN
25.0000 mg | Freq: Four times a day (QID) | INTRAMUSCULAR | Status: DC | PRN
  Administered 2023-08-25 – 2023-08-26 (×3): 25 mg via INTRAVENOUS
  Filled 2023-08-25 (×3): qty 1

## 2023-08-25 MED ORDER — PRENATAL MULTIVITAMIN CH
1.0000 | ORAL_TABLET | Freq: Every day | ORAL | Status: DC
  Administered 2023-08-25 – 2023-08-26 (×2): 1 via ORAL
  Filled 2023-08-25 (×2): qty 1

## 2023-08-25 MED ORDER — BUTALBITAL-APAP-CAFFEINE 50-325-40 MG PO TABS
2.0000 | ORAL_TABLET | Freq: Four times a day (QID) | ORAL | Status: DC | PRN
  Administered 2023-08-25: 2 via ORAL
  Filled 2023-08-25: qty 2

## 2023-08-25 MED ORDER — POTASSIUM CHLORIDE CRYS ER 20 MEQ PO TBCR
20.0000 meq | EXTENDED_RELEASE_TABLET | Freq: Two times a day (BID) | ORAL | Status: DC
  Administered 2023-08-25 – 2023-08-26 (×3): 20 meq via ORAL
  Filled 2023-08-25 (×3): qty 1

## 2023-08-25 NOTE — Progress Notes (Signed)
CLINICAL DATA:  Shortness of breath.  Carbon monoxide exposure. EXAM: PORTABLE CHEST 1 VIEW COMPARISON:  Radiographs 07/12/2023 and 05/03/2023.  CT 05/03/2023. FINDINGS: 1708 hours. The heart size and mediastinal contours are stable with chronic superior hilar retraction. There is stable chronic, age-advanced interstitial lung disease with architectural distortion, subpleural reticulation and paraseptal cyst formation. No superimposed edema, confluent airspace disease, pleural effusion or pneumothorax identified. The bones appear unchanged. IMPRESSION: Stable chronic interstitial lung disease. No evidence of superimposed acute process. Electronically Signed   By: Carey Bullocks M.D.   On: 08/18/2023 18:20   [place] MFM FETAL BPP WO NON STRESS  Result Date: 08/15/2023 ----------------------------------------------------------------------  OBSTETRICS REPORT                       (Signed Final 08/15/2023 10:45 am) ---------------------------------------------------------------------- Patient Info  ID #:       [id number]                          D.O.B.:  27-Jun-1991 (32 yrs)  Name:       Kayla [name]                  Visit Date: 08/15/2023 09:32 am ---------------------------------------------------------------------- Performed By  Attending:        Ma Rings MD         Ref. Address:     [address]                                                             [place], [place]                                                             [address]  Performed By:     Marcellina Millin       [facility]  Referred By:      Adam Phenix  Limited views                                                                        previously seen  Heart:                 Previously seen        Upper Extremities:      Previously seen  RVOT:                  Previously seen        Lower Extremities:      Previously seen  Other:  SVC/IVC, 3VV, 3VTV, Hands, feet/heels, Nasal bone, lenses,          mandible and falx previously visualized. Female gender previously          seen. ---------------------------------------------------------------------- Comments  This patient was seen for a BPP and growth scan due to  chronic hypertension and lupus.  She denies any problems  since her last exam.  The overall EFW of 7 pounds 2 ounces measures at the 76  percentile for her gestational age.  There was normal  amniotic fluid noted today.  A biophysical profile performed today was 8/8.  The patient is already scheduled for delivery in 3 days.  No further exams were scheduled in our office.  ----------------------------------------------------------------------                   Ma Rings, MD Electronically Signed Final Report   08/15/2023 10:45 am ----------------------------------------------------------------------   US Fetal BPP W/O Non Stress  Result Date: 08/11/2023 ----------------------------------------------------------------------  OBSTETRICS REPORT                       (Signed Final 08/11/2023 11:46 am) ---------------------------------------------------------------------- Patient Info  ID #:       [id number]                          D.O.B.:  29-Apr-1991 (32 yrs)  Name:       Kayla [name]                  Visit Date: 08/03/2023 02:16 pm ---------------------------------------------------------------------- Performed By  Attending:        Merian Capron MD     Ref. Address:     [address]                                                             [place], [place]                                                             [address]  Performed By:     Otilio Jefferson BA       Location:         Center  [name], female   DOB: 22-Apr-1991, 32 y.o.   MRN: [id number]   Assessment/Plan: Principal Problem:   Severe pre-eclampsia  Formula feeding On lasix and procardia BP improved Magnesium x 24 hours. DC home tomorrow Wean O2  Subjective: Interval History:feels better  Objective: Vital signs in last 24 hours: Temp:  [98.1 F (36.7 C)-99.1 F (37.3 C)] 98.1 F (36.7 C) (10/04 1609) Pulse Rate:  [58-87] 82 (10/04 1609) Resp:  [16-21] 19 (10/04 1609) BP: (87-168)/(56-99) 120/73 (10/04 1609) SpO2:  [90 %-99 %] 97 % (10/04 1609)   Intake/Output Summary (Last 24 hours) at 08/25/2023 1720 Last data filed at 08/25/2023 1643 Gross per 24 hour  Intake 2796.47 ml  Output 4200 ml  Net -1403.53 ml    BP 120/73 (BP Location: Right Arm)   Pulse 82   Temp 98.1 F (36.7 C) (Oral)   Resp 19   SpO2 97%   Breastfeeding Yes  General appearance: alert, cooperative, and appears stated age Head: Normocephalic, without obvious abnormality, atraumatic Neck: supple, symmetrical, trachea midline Lungs:  normal effort Heart: regular rate and rhythm Abdomen: soft, non-tender; bowel sounds normal; no masses,  no organomegaly Extremities: extremities normal, atraumatic, no cyanosis or edema  Results for orders placed or performed during the hospital encounter of 08/24/23 (from the past 24 hour(s))  CBC with Differential/Platelet     Status: None   Collection Time: 08/24/23  7:34 PM  Result Value Ref Range   WBC 7.0 4.0 - 10.5 K/uL   RBC 4.62 3.87 - 5.11 MIL/uL   Hemoglobin 12.5 12.0 - 15.0 g/dL   HCT 64.4 03.4 - 74.2 %   MCV 85.3 80.0 - 100.0 fL   MCH 27.1 26.0 - 34.0 pg   MCHC 31.7 30.0 - 36.0 g/dL   RDW 59.5 63.8 - 75.6 %   Platelets 204 150 - 400 K/uL   nRBC 0.0 0.0 - 0.2 %   Neutrophils Relative % 56 %   Neutro Abs 3.9 1.7 - 7.7 K/uL   Lymphocytes Relative 32 %   Lymphs Abs 2.3 0.7 - 4.0 K/uL   Monocytes Relative 10 %   Monocytes Absolute 0.7 0.1 - 1.0 K/uL    Eosinophils Relative 1 %   Eosinophils Absolute 0.1 0.0 - 0.5 K/uL   Basophils Relative 0 %   Basophils Absolute 0.0 0.0 - 0.1 K/uL   Immature Granulocytes 1 %   Abs Immature Granulocytes 0.04 0.00 - 0.07 K/uL  Comprehensive metabolic panel     Status: Abnormal   Collection Time: 08/24/23  7:34 PM  Result Value Ref Range   Sodium 137 135 - 145 mmol/L   Potassium 3.6 3.5 - 5.1 mmol/L   Chloride 102 98 - 111 mmol/L   CO2 25 22 - 32 mmol/L   Glucose, Bld 89 70 - 99 mg/dL   BUN 9 6 - 20 mg/dL   Creatinine, Ser 4.33 0.44 - 1.00 mg/dL   Calcium 8.8 (L) 8.9 - 10.3 mg/dL   Total Protein 6.3 (L) 6.5 - 8.1 g/dL   Albumin 3.1 (L) 3.5 - 5.0 g/dL   AST 27 15 - 41 U/L   ALT 24 0 - 44 U/L   Alkaline Phosphatase 78 38 - 126 U/L   Total Bilirubin 0.8 0.3 - 1.2 mg/dL   GFR, Estimated >29 >51 mL/min   Anion gap 10 5 - 15  SARS Coronavirus 2 by RT PCR (hospital order, performed in [facility] hospital lab) *cepheid single result  CLINICAL DATA:  Shortness of breath.  Carbon monoxide exposure. EXAM: PORTABLE CHEST 1 VIEW COMPARISON:  Radiographs 07/12/2023 and 05/03/2023.  CT 05/03/2023. FINDINGS: 1708 hours. The heart size and mediastinal contours are stable with chronic superior hilar retraction. There is stable chronic, age-advanced interstitial lung disease with architectural distortion, subpleural reticulation and paraseptal cyst formation. No superimposed edema, confluent airspace disease, pleural effusion or pneumothorax identified. The bones appear unchanged. IMPRESSION: Stable chronic interstitial lung disease. No evidence of superimposed acute process. Electronically Signed   By: Carey Bullocks M.D.   On: 08/18/2023 18:20   [place] MFM FETAL BPP WO NON STRESS  Result Date: 08/15/2023 ----------------------------------------------------------------------  OBSTETRICS REPORT                       (Signed Final 08/15/2023 10:45 am) ---------------------------------------------------------------------- Patient Info  ID #:       [id number]                          D.O.B.:  27-Jun-1991 (32 yrs)  Name:       Kayla [name]                  Visit Date: 08/15/2023 09:32 am ---------------------------------------------------------------------- Performed By  Attending:        Ma Rings MD         Ref. Address:     [address]                                                             [place], [place]                                                             [address]  Performed By:     Marcellina Millin       [facility]  Referred By:      Adam Phenix  Ma Rings, MD Electronically Signed Final Report   08/08/2023 04:08 pm ----------------------------------------------------------------------   [place] MFM FETAL BPP WO NON STRESS  Result Date: 08/01/2023 ----------------------------------------------------------------------  OBSTETRICS REPORT                    (Corrected Final 08/01/2023 11:23 am) ---------------------------------------------------------------------- Patient Info  ID #:       [id number]                          D.O.B.:  [birth year] (32 yrs)  Name:       Kayla [name]                  Visit Date: 08/01/2023 10:40 am ---------------------------------------------------------------------- Performed By  Attending:        Braxton Feathers DO       Ref. Address:     [address]                                                             [place], [place]                                                             [address]  Performed By:     Reinaldo Raddle            [facility]  Referred By:      Adam Phenix                    MD ---------------------------------------------------------------------- Orders  #  Description                           Code        Ordered By  1  [place] MFM FETAL BPP WO NON               [phone]    YU FANG  Upper Extremities:      Previously seen  RVOT:                  Previously seen        Lower Extremities:      Previously seen  Other:  SVC/IVC, 3VV, 3VTV, Hands, feet/heels, Nasal bone, lenses,          mandible and falx previously visualized. Female gender previously          seen. ---------------------------------------------------------------------- Comments  This patient was seen for a BPP and growth scan due to  chronic hypertension and lupus.  She denies any problems  since her last exam.  The overall EFW of 7 pounds 2 ounces measures at the 76  percentile for her gestational age.  There was normal  amniotic fluid noted today.  A biophysical profile performed today was 8/8.  The patient is already scheduled for delivery in 3 days.  No further exams were scheduled in our office. ----------------------------------------------------------------------                   Ma Rings, MD Electronically Signed Final Report   08/15/2023 10:45 am ----------------------------------------------------------------------   [place] MFM OB FOLLOW UP  Result Date: 08/15/2023 ----------------------------------------------------------------------  OBSTETRICS REPORT                       (Signed Final 08/15/2023 10:45 am) ---------------------------------------------------------------------- Patient Info  ID #:       [id number]                          D.O.B.:  14-Jul-1991 (32 yrs)  Name:       Kayla [name]                  Visit Date: 08/15/2023 09:32 am  ---------------------------------------------------------------------- Performed By  Attending:        Ma Rings MD         Ref. Address:     [address]                                                             [place], [place]                                                             [address]  Performed By:     Marcellina Millin       [facility]                                     [facility]  Limited views                                                                        previously seen  Heart:                 Previously seen        Upper Extremities:      Previously seen  RVOT:                  Previously seen        Lower Extremities:      Previously seen  Other:  SVC/IVC, 3VV, 3VTV, Hands, feet/heels, Nasal bone, lenses,          mandible and falx previously visualized. Female gender previously          seen. ---------------------------------------------------------------------- Comments  This patient was seen for a BPP and growth scan due to  chronic hypertension and lupus.  She denies any problems  since her last exam.  The overall EFW of 7 pounds 2 ounces measures at the 76  percentile for her gestational age.  There was normal  amniotic fluid noted today.  A biophysical profile performed today was 8/8.  The patient is already scheduled for delivery in 3 days.  No further exams were scheduled in our office.  ----------------------------------------------------------------------                   Ma Rings, MD Electronically Signed Final Report   08/15/2023 10:45 am ----------------------------------------------------------------------   US Fetal BPP W/O Non Stress  Result Date: 08/11/2023 ----------------------------------------------------------------------  OBSTETRICS REPORT                       (Signed Final 08/11/2023 11:46 am) ---------------------------------------------------------------------- Patient Info  ID #:       [id number]                          D.O.B.:  29-Apr-1991 (32 yrs)  Name:       Kayla [name]                  Visit Date: 08/03/2023 02:16 pm ---------------------------------------------------------------------- Performed By  Attending:        Merian Capron MD     Ref. Address:     [address]                                                             [place], [place]                                                             [address]  Performed By:     Otilio Jefferson BA       Location:         Center  Ma Rings, MD Electronically Signed Final Report   08/08/2023 04:08 pm ----------------------------------------------------------------------   [place] MFM FETAL BPP WO NON STRESS  Result Date: 08/01/2023 ----------------------------------------------------------------------  OBSTETRICS REPORT                    (Corrected Final 08/01/2023 11:23 am) ---------------------------------------------------------------------- Patient Info  ID #:       [id number]                          D.O.B.:  [birth year] (32 yrs)  Name:       Kayla [name]                  Visit Date: 08/01/2023 10:40 am ---------------------------------------------------------------------- Performed By  Attending:        Braxton Feathers DO       Ref. Address:     [address]                                                             [place], [place]                                                             [address]  Performed By:     Reinaldo Raddle            Location[facility]  Referred By:      Adam Phenix                    MD ---------------------------------------------------------------------- Orders  #  Description                           Code        Ordered By  1  [place] MFM FETAL BPP WO NON               [phone]    YU FANG  CLINICAL DATA:  Shortness of breath.  Carbon monoxide exposure. EXAM: PORTABLE CHEST 1 VIEW COMPARISON:  Radiographs 07/12/2023 and 05/03/2023.  CT 05/03/2023. FINDINGS: 1708 hours. The heart size and mediastinal contours are stable with chronic superior hilar retraction. There is stable chronic, age-advanced interstitial lung disease with architectural distortion, subpleural reticulation and paraseptal cyst formation. No superimposed edema, confluent airspace disease, pleural effusion or pneumothorax identified. The bones appear unchanged. IMPRESSION: Stable chronic interstitial lung disease. No evidence of superimposed acute process. Electronically Signed   By: Carey Bullocks M.D.   On: 08/18/2023 18:20   [place] MFM FETAL BPP WO NON STRESS  Result Date: 08/15/2023 ----------------------------------------------------------------------  OBSTETRICS REPORT                       (Signed Final 08/15/2023 10:45 am) ---------------------------------------------------------------------- Patient Info  ID #:       [id number]                          D.O.B.:  27-Jun-1991 (32 yrs)  Name:       Kayla [name]                  Visit Date: 08/15/2023 09:32 am ---------------------------------------------------------------------- Performed By  Attending:        Ma Rings MD         Ref. Address:     [address]                                                             [place], [place]                                                             [address]  Performed By:     Marcellina Millin       [facility]  Referred By:      Adam Phenix  [name], female   DOB: 22-Apr-1991, 32 y.o.   MRN: [id number]   Assessment/Plan: Principal Problem:   Severe pre-eclampsia  Formula feeding On lasix and procardia BP improved Magnesium x 24 hours. DC home tomorrow Wean O2  Subjective: Interval History:feels better  Objective: Vital signs in last 24 hours: Temp:  [98.1 F (36.7 C)-99.1 F (37.3 C)] 98.1 F (36.7 C) (10/04 1609) Pulse Rate:  [58-87] 82 (10/04 1609) Resp:  [16-21] 19 (10/04 1609) BP: (87-168)/(56-99) 120/73 (10/04 1609) SpO2:  [90 %-99 %] 97 % (10/04 1609)   Intake/Output Summary (Last 24 hours) at 08/25/2023 1720 Last data filed at 08/25/2023 1643 Gross per 24 hour  Intake 2796.47 ml  Output 4200 ml  Net -1403.53 ml    BP 120/73 (BP Location: Right Arm)   Pulse 82   Temp 98.1 F (36.7 C) (Oral)   Resp 19   SpO2 97%   Breastfeeding Yes  General appearance: alert, cooperative, and appears stated age Head: Normocephalic, without obvious abnormality, atraumatic Neck: supple, symmetrical, trachea midline Lungs:  normal effort Heart: regular rate and rhythm Abdomen: soft, non-tender; bowel sounds normal; no masses,  no organomegaly Extremities: extremities normal, atraumatic, no cyanosis or edema  Results for orders placed or performed during the hospital encounter of 08/24/23 (from the past 24 hour(s))  CBC with Differential/Platelet     Status: None   Collection Time: 08/24/23  7:34 PM  Result Value Ref Range   WBC 7.0 4.0 - 10.5 K/uL   RBC 4.62 3.87 - 5.11 MIL/uL   Hemoglobin 12.5 12.0 - 15.0 g/dL   HCT 64.4 03.4 - 74.2 %   MCV 85.3 80.0 - 100.0 fL   MCH 27.1 26.0 - 34.0 pg   MCHC 31.7 30.0 - 36.0 g/dL   RDW 59.5 63.8 - 75.6 %   Platelets 204 150 - 400 K/uL   nRBC 0.0 0.0 - 0.2 %   Neutrophils Relative % 56 %   Neutro Abs 3.9 1.7 - 7.7 K/uL   Lymphocytes Relative 32 %   Lymphs Abs 2.3 0.7 - 4.0 K/uL   Monocytes Relative 10 %   Monocytes Absolute 0.7 0.1 - 1.0 K/uL    Eosinophils Relative 1 %   Eosinophils Absolute 0.1 0.0 - 0.5 K/uL   Basophils Relative 0 %   Basophils Absolute 0.0 0.0 - 0.1 K/uL   Immature Granulocytes 1 %   Abs Immature Granulocytes 0.04 0.00 - 0.07 K/uL  Comprehensive metabolic panel     Status: Abnormal   Collection Time: 08/24/23  7:34 PM  Result Value Ref Range   Sodium 137 135 - 145 mmol/L   Potassium 3.6 3.5 - 5.1 mmol/L   Chloride 102 98 - 111 mmol/L   CO2 25 22 - 32 mmol/L   Glucose, Bld 89 70 - 99 mg/dL   BUN 9 6 - 20 mg/dL   Creatinine, Ser 4.33 0.44 - 1.00 mg/dL   Calcium 8.8 (L) 8.9 - 10.3 mg/dL   Total Protein 6.3 (L) 6.5 - 8.1 g/dL   Albumin 3.1 (L) 3.5 - 5.0 g/dL   AST 27 15 - 41 U/L   ALT 24 0 - 44 U/L   Alkaline Phosphatase 78 38 - 126 U/L   Total Bilirubin 0.8 0.3 - 1.2 mg/dL   GFR, Estimated >29 >51 mL/min   Anion gap 10 5 - 15  SARS Coronavirus 2 by RT PCR (hospital order, performed in [facility] hospital lab) *cepheid single result  Limited views                                                                        previously seen  Heart:                 Previously seen        Upper Extremities:      Previously seen  RVOT:                  Previously seen        Lower Extremities:      Previously seen  Other:  SVC/IVC, 3VV, 3VTV, Hands, feet/heels, Nasal bone, lenses,          mandible and falx previously visualized. Female gender previously          seen. ---------------------------------------------------------------------- Comments  This patient was seen for a BPP and growth scan due to  chronic hypertension and lupus.  She denies any problems  since her last exam.  The overall EFW of 7 pounds 2 ounces measures at the 76  percentile for her gestational age.  There was normal  amniotic fluid noted today.  A biophysical profile performed today was 8/8.  The patient is already scheduled for delivery in 3 days.  No further exams were scheduled in our office.  ----------------------------------------------------------------------                   Ma Rings, MD Electronically Signed Final Report   08/15/2023 10:45 am ----------------------------------------------------------------------   US Fetal BPP W/O Non Stress  Result Date: 08/11/2023 ----------------------------------------------------------------------  OBSTETRICS REPORT                       (Signed Final 08/11/2023 11:46 am) ---------------------------------------------------------------------- Patient Info  ID #:       [id number]                          D.O.B.:  29-Apr-1991 (32 yrs)  Name:       Kayla [name]                  Visit Date: 08/03/2023 02:16 pm ---------------------------------------------------------------------- Performed By  Attending:        Merian Capron MD     Ref. Address:     [address]                                                             [place], [place]                                                             [address]  Performed By:     Otilio Jefferson BA       Location:         Center  Limited views                                                                        previously seen  Heart:                 Previously seen        Upper Extremities:      Previously seen  RVOT:                  Previously seen        Lower Extremities:      Previously seen  Other:  SVC/IVC, 3VV, 3VTV, Hands, feet/heels, Nasal bone, lenses,          mandible and falx previously visualized. Female gender previously          seen. ---------------------------------------------------------------------- Comments  This patient was seen for a BPP and growth scan due to  chronic hypertension and lupus.  She denies any problems  since her last exam.  The overall EFW of 7 pounds 2 ounces measures at the 76  percentile for her gestational age.  There was normal  amniotic fluid noted today.  A biophysical profile performed today was 8/8.  The patient is already scheduled for delivery in 3 days.  No further exams were scheduled in our office.  ----------------------------------------------------------------------                   Ma Rings, MD Electronically Signed Final Report   08/15/2023 10:45 am ----------------------------------------------------------------------   US Fetal BPP W/O Non Stress  Result Date: 08/11/2023 ----------------------------------------------------------------------  OBSTETRICS REPORT                       (Signed Final 08/11/2023 11:46 am) ---------------------------------------------------------------------- Patient Info  ID #:       [id number]                          D.O.B.:  29-Apr-1991 (32 yrs)  Name:       Kayla [name]                  Visit Date: 08/03/2023 02:16 pm ---------------------------------------------------------------------- Performed By  Attending:        Merian Capron MD     Ref. Address:     [address]                                                             [place], [place]                                                             [address]  Performed By:     Otilio Jefferson BA       Location:         Center  Limited views                                                                        previously seen  Heart:                 Previously seen        Upper Extremities:      Previously seen  RVOT:                  Previously seen        Lower Extremities:      Previously seen  Other:  SVC/IVC, 3VV, 3VTV, Hands, feet/heels, Nasal bone, lenses,          mandible and falx previously visualized. Female gender previously          seen. ---------------------------------------------------------------------- Comments  This patient was seen for a BPP and growth scan due to  chronic hypertension and lupus.  She denies any problems  since her last exam.  The overall EFW of 7 pounds 2 ounces measures at the 76  percentile for her gestational age.  There was normal  amniotic fluid noted today.  A biophysical profile performed today was 8/8.  The patient is already scheduled for delivery in 3 days.  No further exams were scheduled in our office.  ----------------------------------------------------------------------                   Ma Rings, MD Electronically Signed Final Report   08/15/2023 10:45 am ----------------------------------------------------------------------   US Fetal BPP W/O Non Stress  Result Date: 08/11/2023 ----------------------------------------------------------------------  OBSTETRICS REPORT                       (Signed Final 08/11/2023 11:46 am) ---------------------------------------------------------------------- Patient Info  ID #:       [id number]                          D.O.B.:  29-Apr-1991 (32 yrs)  Name:       Kayla [name]                  Visit Date: 08/03/2023 02:16 pm ---------------------------------------------------------------------- Performed By  Attending:        Merian Capron MD     Ref. Address:     [address]                                                             [place], [place]                                                             [address]  Performed By:     Otilio Jefferson BA       Location:         Center  Upper Extremities:      Previously seen  RVOT:                  Previously seen        Lower Extremities:      Previously seen  Other:  SVC/IVC, 3VV, 3VTV, Hands, feet/heels, Nasal bone, lenses,          mandible and falx previously visualized. Female gender previously          seen. ---------------------------------------------------------------------- Comments  This patient was seen for a BPP and growth scan due to  chronic hypertension and lupus.  She denies any problems  since her last exam.  The overall EFW of 7 pounds 2 ounces measures at the 76  percentile for her gestational age.  There was normal  amniotic fluid noted today.  A biophysical profile performed today was 8/8.  The patient is already scheduled for delivery in 3 days.  No further exams were scheduled in our office. ----------------------------------------------------------------------                   Ma Rings, MD Electronically Signed Final Report   08/15/2023 10:45 am ----------------------------------------------------------------------   [place] MFM OB FOLLOW UP  Result Date: 08/15/2023 ----------------------------------------------------------------------  OBSTETRICS REPORT                       (Signed Final 08/15/2023 10:45 am) ---------------------------------------------------------------------- Patient Info  ID #:       [id number]                          D.O.B.:  14-Jul-1991 (32 yrs)  Name:       Kayla [name]                  Visit Date: 08/15/2023 09:32 am  ---------------------------------------------------------------------- Performed By  Attending:        Ma Rings MD         Ref. Address:     [address]                                                             [place], [place]                                                             [address]  Performed By:     Marcellina Millin       [facility]                                     [facility]  [name], female   DOB: 22-Apr-1991, 32 y.o.   MRN: [id number]   Assessment/Plan: Principal Problem:   Severe pre-eclampsia  Formula feeding On lasix and procardia BP improved Magnesium x 24 hours. DC home tomorrow Wean O2  Subjective: Interval History:feels better  Objective: Vital signs in last 24 hours: Temp:  [98.1 F (36.7 C)-99.1 F (37.3 C)] 98.1 F (36.7 C) (10/04 1609) Pulse Rate:  [58-87] 82 (10/04 1609) Resp:  [16-21] 19 (10/04 1609) BP: (87-168)/(56-99) 120/73 (10/04 1609) SpO2:  [90 %-99 %] 97 % (10/04 1609)   Intake/Output Summary (Last 24 hours) at 08/25/2023 1720 Last data filed at 08/25/2023 1643 Gross per 24 hour  Intake 2796.47 ml  Output 4200 ml  Net -1403.53 ml    BP 120/73 (BP Location: Right Arm)   Pulse 82   Temp 98.1 F (36.7 C) (Oral)   Resp 19   SpO2 97%   Breastfeeding Yes  General appearance: alert, cooperative, and appears stated age Head: Normocephalic, without obvious abnormality, atraumatic Neck: supple, symmetrical, trachea midline Lungs:  normal effort Heart: regular rate and rhythm Abdomen: soft, non-tender; bowel sounds normal; no masses,  no organomegaly Extremities: extremities normal, atraumatic, no cyanosis or edema  Results for orders placed or performed during the hospital encounter of 08/24/23 (from the past 24 hour(s))  CBC with Differential/Platelet     Status: None   Collection Time: 08/24/23  7:34 PM  Result Value Ref Range   WBC 7.0 4.0 - 10.5 K/uL   RBC 4.62 3.87 - 5.11 MIL/uL   Hemoglobin 12.5 12.0 - 15.0 g/dL   HCT 64.4 03.4 - 74.2 %   MCV 85.3 80.0 - 100.0 fL   MCH 27.1 26.0 - 34.0 pg   MCHC 31.7 30.0 - 36.0 g/dL   RDW 59.5 63.8 - 75.6 %   Platelets 204 150 - 400 K/uL   nRBC 0.0 0.0 - 0.2 %   Neutrophils Relative % 56 %   Neutro Abs 3.9 1.7 - 7.7 K/uL   Lymphocytes Relative 32 %   Lymphs Abs 2.3 0.7 - 4.0 K/uL   Monocytes Relative 10 %   Monocytes Absolute 0.7 0.1 - 1.0 K/uL    Eosinophils Relative 1 %   Eosinophils Absolute 0.1 0.0 - 0.5 K/uL   Basophils Relative 0 %   Basophils Absolute 0.0 0.0 - 0.1 K/uL   Immature Granulocytes 1 %   Abs Immature Granulocytes 0.04 0.00 - 0.07 K/uL  Comprehensive metabolic panel     Status: Abnormal   Collection Time: 08/24/23  7:34 PM  Result Value Ref Range   Sodium 137 135 - 145 mmol/L   Potassium 3.6 3.5 - 5.1 mmol/L   Chloride 102 98 - 111 mmol/L   CO2 25 22 - 32 mmol/L   Glucose, Bld 89 70 - 99 mg/dL   BUN 9 6 - 20 mg/dL   Creatinine, Ser 4.33 0.44 - 1.00 mg/dL   Calcium 8.8 (L) 8.9 - 10.3 mg/dL   Total Protein 6.3 (L) 6.5 - 8.1 g/dL   Albumin 3.1 (L) 3.5 - 5.0 g/dL   AST 27 15 - 41 U/L   ALT 24 0 - 44 U/L   Alkaline Phosphatase 78 38 - 126 U/L   Total Bilirubin 0.8 0.3 - 1.2 mg/dL   GFR, Estimated >29 >51 mL/min   Anion gap 10 5 - 15  SARS Coronavirus 2 by RT PCR (hospital order, performed in [facility] hospital lab) *cepheid single result  70          6.57  RUQ(cm)       RLQ(cm)       LUQ(cm)        LLQ(cm)  6.57          1.86          4.22           6.1 ---------------------------------------------------------------------- Biophysical Evaluation  Amniotic F.V:   Within normal limits       F. Tone:        Observed  F. Movement:    Observed                   Score:          8/8  F. Breathing:   Observed ---------------------------------------------------------------------- OB History  Gravidity:    5         Term:   1        Prem:   1        SAB:   2  TOP:          0       Ectopic:  0        Living: 1 ---------------------------------------------------------------------- Gestational Age  LMP:           34w 3d        Date:  12/05/22                  EDD:   09/11/23  Best:          Consuello Closs 0d     Det. ByMarcella Dubs         EDD:   09/07/23                                      (01/11/23) ---------------------------------------------------------------------- Impression  Antenatal testing due to diagnosis of lupus and chronic  hypertension.  Testing is reassuring, BPP 8/8. ---------------------------------------------------------------------- Recommendations  Continue weekly antenatal testing till delivery . ----------------------------------------------------------------------               Merian Capron, MD Electronically Signed Final Report   08/11/2023 11:46 am  ----------------------------------------------------------------------   [place] MFM FETAL BPP WO NON STRESS  Result Date: 08/08/2023 ----------------------------------------------------------------------  OBSTETRICS REPORT                       (Signed Final 08/08/2023 04:08 pm) ---------------------------------------------------------------------- Patient Info  ID #:       [id number]                          D.O.B.:  10/30/1991 (32 yrs)  Name:       Kayla [name]                  Visit Date: 08/08/2023 11:12 am ---------------------------------------------------------------------- Performed By  Attending:        Ma Rings MD         Ref. Address:     [address]  Upper Extremities:      Previously seen  RVOT:                  Previously seen        Lower Extremities:      Previously seen  Other:  SVC/IVC, 3VV, 3VTV, Hands, feet/heels, Nasal bone, lenses,          mandible and falx previously visualized. Female gender previously          seen. ---------------------------------------------------------------------- Comments  This patient was seen for a BPP and growth scan due to  chronic hypertension and lupus.  She denies any problems  since her last exam.  The overall EFW of 7 pounds 2 ounces measures at the 76  percentile for her gestational age.  There was normal  amniotic fluid noted today.  A biophysical profile performed today was 8/8.  The patient is already scheduled for delivery in 3 days.  No further exams were scheduled in our office. ----------------------------------------------------------------------                   Ma Rings, MD Electronically Signed Final Report   08/15/2023 10:45 am ----------------------------------------------------------------------   [place] MFM OB FOLLOW UP  Result Date: 08/15/2023 ----------------------------------------------------------------------  OBSTETRICS REPORT                       (Signed Final 08/15/2023 10:45 am) ---------------------------------------------------------------------- Patient Info  ID #:       [id number]                          D.O.B.:  14-Jul-1991 (32 yrs)  Name:       Kayla [name]                  Visit Date: 08/15/2023 09:32 am  ---------------------------------------------------------------------- Performed By  Attending:        Ma Rings MD         Ref. Address:     [address]                                                             [place], [place]                                                             [address]  Performed By:     Marcellina Millin       [facility]                                     [facility] at  [name], female   DOB: 22-Apr-1991, 32 y.o.   MRN: [id number]   Assessment/Plan: Principal Problem:   Severe pre-eclampsia  Formula feeding On lasix and procardia BP improved Magnesium x 24 hours. DC home tomorrow Wean O2  Subjective: Interval History:feels better  Objective: Vital signs in last 24 hours: Temp:  [98.1 F (36.7 C)-99.1 F (37.3 C)] 98.1 F (36.7 C) (10/04 1609) Pulse Rate:  [58-87] 82 (10/04 1609) Resp:  [16-21] 19 (10/04 1609) BP: (87-168)/(56-99) 120/73 (10/04 1609) SpO2:  [90 %-99 %] 97 % (10/04 1609)   Intake/Output Summary (Last 24 hours) at 08/25/2023 1720 Last data filed at 08/25/2023 1643 Gross per 24 hour  Intake 2796.47 ml  Output 4200 ml  Net -1403.53 ml    BP 120/73 (BP Location: Right Arm)   Pulse 82   Temp 98.1 F (36.7 C) (Oral)   Resp 19   SpO2 97%   Breastfeeding Yes  General appearance: alert, cooperative, and appears stated age Head: Normocephalic, without obvious abnormality, atraumatic Neck: supple, symmetrical, trachea midline Lungs:  normal effort Heart: regular rate and rhythm Abdomen: soft, non-tender; bowel sounds normal; no masses,  no organomegaly Extremities: extremities normal, atraumatic, no cyanosis or edema  Results for orders placed or performed during the hospital encounter of 08/24/23 (from the past 24 hour(s))  CBC with Differential/Platelet     Status: None   Collection Time: 08/24/23  7:34 PM  Result Value Ref Range   WBC 7.0 4.0 - 10.5 K/uL   RBC 4.62 3.87 - 5.11 MIL/uL   Hemoglobin 12.5 12.0 - 15.0 g/dL   HCT 64.4 03.4 - 74.2 %   MCV 85.3 80.0 - 100.0 fL   MCH 27.1 26.0 - 34.0 pg   MCHC 31.7 30.0 - 36.0 g/dL   RDW 59.5 63.8 - 75.6 %   Platelets 204 150 - 400 K/uL   nRBC 0.0 0.0 - 0.2 %   Neutrophils Relative % 56 %   Neutro Abs 3.9 1.7 - 7.7 K/uL   Lymphocytes Relative 32 %   Lymphs Abs 2.3 0.7 - 4.0 K/uL   Monocytes Relative 10 %   Monocytes Absolute 0.7 0.1 - 1.0 K/uL    Eosinophils Relative 1 %   Eosinophils Absolute 0.1 0.0 - 0.5 K/uL   Basophils Relative 0 %   Basophils Absolute 0.0 0.0 - 0.1 K/uL   Immature Granulocytes 1 %   Abs Immature Granulocytes 0.04 0.00 - 0.07 K/uL  Comprehensive metabolic panel     Status: Abnormal   Collection Time: 08/24/23  7:34 PM  Result Value Ref Range   Sodium 137 135 - 145 mmol/L   Potassium 3.6 3.5 - 5.1 mmol/L   Chloride 102 98 - 111 mmol/L   CO2 25 22 - 32 mmol/L   Glucose, Bld 89 70 - 99 mg/dL   BUN 9 6 - 20 mg/dL   Creatinine, Ser 4.33 0.44 - 1.00 mg/dL   Calcium 8.8 (L) 8.9 - 10.3 mg/dL   Total Protein 6.3 (L) 6.5 - 8.1 g/dL   Albumin 3.1 (L) 3.5 - 5.0 g/dL   AST 27 15 - 41 U/L   ALT 24 0 - 44 U/L   Alkaline Phosphatase 78 38 - 126 U/L   Total Bilirubin 0.8 0.3 - 1.2 mg/dL   GFR, Estimated >29 >51 mL/min   Anion gap 10 5 - 15  SARS Coronavirus 2 by RT PCR (hospital order, performed in [facility] hospital lab) *cepheid single result

## 2023-08-26 LAB — MAGNESIUM: Magnesium: 2.5 mg/dL — ABNORMAL HIGH (ref 1.7–2.4)

## 2023-08-26 MED ORDER — NIFEDIPINE ER 30 MG PO TB24: 30.0000 mg | ORAL_TABLET | Freq: Every day | ORAL | 0 refills | Status: DC

## 2023-08-26 MED ORDER — BUTALBITAL-APAP-CAFFEINE 50-325-40 MG PO TABS: 2.0000 | ORAL_TABLET | Freq: Four times a day (QID) | ORAL | 0 refills | Status: DC | PRN

## 2023-08-26 MED ORDER — FUROSEMIDE 20 MG PO TABS: 20.0000 mg | ORAL_TABLET | Freq: Every day | ORAL | 0 refills | Status: DC

## 2023-08-26 NOTE — Discharge Summary (Signed)
Physician Discharge Summary  [name] MRN: [id number] DOB/AGE: 1991-02-23 32 y.o.  Admit date: 08/24/2023 Discharge date: 08/26/2023  Admission Diagnoses:postpartum preeclampsia  Discharge Diagnoses:  Principal Problem:   Severe pre-eclampsia   Discharged Condition: good  Hospital Course: [id number] who presented 6 days postpartum s/p a SVD presents to MAU for high blood pressure concerns. She reported a headache that developed early that morning around 5am that felt unusual. She reported elevated blood pressures with a SBP in the 130s. History of hypertension requiring medication, but  she does not recall the medication, although she has not had any issues with her blood pressure in 4 years and has not required medication since. She rated HA pain an 8/10 unrelieved by 1000mg  Tylenol that she took x1 prior to arrival to MAU.  She also reported upper back pain that radiates to her left axilla. She reported as (her normal pain with Lupus). She denied RUQ pain, SOB or visual changes. She denied any noticeable swelling nor lower leg pain or discoloration. She also denied N/V. She stated she decided to come in to be evaluated due to her history of eclampsia.   Patient reports that she developed PreE in a previous pregnancy that lead to her being in a coma Blood pressure was controlled and she received IV magnesium for 24 hrs Consults: None Chronic lung disease with pulmonary fibrosis. More recent opacification of the right upper lobe is unchanged from yesterday and remains suspicious for superimposed pneumonia. Significant Diagnostic Studies: radiology: CXR:    Treatments: IV hydration and respiratory therapy: O2 Magnesium for 24 hr Discharge Exam: Blood pressure 129/83, pulse 82, temperature 98.2 F (36.8 C), temperature source Oral, resp. rate 18, SpO2 96%, currently breastfeeding. General appearance: alert, cooperative, and no distress Resp: normal effort Extremities:  extremities normal, atraumatic, no cyanosis or edema  Disposition: Discharge disposition: 01-Home or Self Care       Discharge Instructions     Discharge patient   Complete by: As directed    Discharge disposition: 01-Home or Self Care   Discharge patient date: 08/26/2023      F/u 10/7 at [facility] is scheduled Allergies as of 08/26/2023       Reactions   Hydrocodone Nausea And Vomiting   Zithromax [azithromycin] Itching, Cough        Medication List     TAKE these medications    acetaminophen 500 MG tablet Commonly known as: TYLENOL Take 1,000 mg by mouth every 6 (six) hours as needed.   azaTHIOprine 50 MG tablet Commonly known as: IMURAN Take 50 mg by mouth daily.   butalbital-acetaminophen-caffeine 50-325-40 MG tablet Commonly known as: FIORICET Take 2 tablets by mouth every 6 (six) hours as needed for headache.   cyclobenzaprine 10 MG tablet Commonly known as: FLEXERIL Take 1 tablet (10 mg total) by mouth 2 (two) times daily as needed for muscle spasms.   furosemide 20 MG tablet Commonly known as: LASIX Take 1 tablet (20 mg total) by mouth daily. What changed:  medication strength how much to take   hydroxychloroquine 200 MG tablet Commonly known as: PLAQUENIL Take 200 mg by mouth 2 (two) times daily.   ibuprofen 600 MG tablet Commonly known as: ADVIL Take 1 tablet (600 mg total) by mouth every 6 (six) hours.   ipratropium 17 MCG/ACT inhaler Commonly known as: ATROVENT HFA Inhale 2 puffs into the lungs every 6 (six) hours as needed for wheezing.   NIFEdipine 30 MG 24 hr tablet Commonly known  as: ADALAT CC Take 1 tablet (30 mg total) by mouth daily.   nystatin-triamcinolone cream Commonly known as: MYCOLOG II Apply to affected area daily   ondansetron 4 MG disintegrating tablet Commonly known as: ZOFRAN-ODT Take 1 tablet (4 mg total) by mouth every 6 (six) hours as needed for nausea.   pantoprazole 40 MG tablet Commonly known as:  Protonix Take 1 tablet (40 mg total) by mouth 2 (two) times daily before a meal.   predniSONE 5 MG tablet Commonly known as: DELTASONE Take 5 mg by mouth daily with breakfast.   Prenatal 28-0.8 MG Tabs Take 1 tablet by mouth daily.   Senna-S 8.6-50 MG tablet Generic drug: senna-docusate Take 2 tablets by mouth at bedtime as needed for mild constipation.         Signed: Scheryl Darter 08/26/2023, 9:38 AM

## 2023-08-28 ENCOUNTER — Encounter: Payer: Self-pay | Admitting: *Deleted

## 2023-08-28 ENCOUNTER — Telehealth: Payer: Self-pay | Admitting: *Deleted

## 2023-08-28 ENCOUNTER — Ambulatory Visit: Payer: Medicare HMO

## 2023-08-28 NOTE — Telephone Encounter (Signed)
Kayla [name] missed an appointment for BP check.  I called and left a message to notify her she missed an appoinment and to please call or message [place] to reschedule. I will also send a Mychart message. Nancy Fetter

## 2023-09-13 ENCOUNTER — Telehealth (HOSPITAL_COMMUNITY): Payer: Self-pay | Admitting: *Deleted

## 2023-09-13 NOTE — Telephone Encounter (Signed)
Attempted hospital discharge follow-up call. No answer received. Deforest Hoyles, RN, 09/13/23, [phone]

## 2023-09-28 ENCOUNTER — Encounter: Payer: Self-pay | Admitting: Certified Nurse Midwife

## 2023-09-28 ENCOUNTER — Other Ambulatory Visit: Payer: Self-pay | Admitting: Obstetrics & Gynecology

## 2023-09-28 ENCOUNTER — Ambulatory Visit: Payer: Medicare HMO | Admitting: Certified Nurse Midwife

## 2023-09-28 ENCOUNTER — Other Ambulatory Visit: Payer: Self-pay

## 2023-09-28 DIAGNOSIS — Z3009 Encounter for other general counseling and advice on contraception: Secondary | ICD-10-CM | POA: Diagnosis not present

## 2023-09-28 DIAGNOSIS — O10919 Unspecified pre-existing hypertension complicating pregnancy, unspecified trimester: Secondary | ICD-10-CM

## 2023-09-28 DIAGNOSIS — F419 Anxiety disorder, unspecified: Secondary | ICD-10-CM

## 2023-09-28 MED ORDER — BORIC ACID CRYS: 500.0000 mg | CRYSTALS | Freq: Every day | 0 refills | Status: DC

## 2023-09-28 MED ORDER — HYDROXYZINE HCL 10 MG PO TABS: 10.0000 mg | ORAL_TABLET | Freq: Three times a day (TID) | ORAL | 5 refills | Status: DC | PRN

## 2023-09-28 NOTE — Progress Notes (Signed)
Post Partum Visit Note  Kayla [name] is a 32 y.o. [phone] female who presents for a postpartum visit. She is 5 weeks postpartum following a normal spontaneous vaginal delivery.  I have fully reviewed the prenatal and intrapartum course. The delivery was at 37.2 gestational weeks.  Anesthesia: epidural. Postpartum course has been complicated by Readmission for PreE. Baby is doing well. Baby is feeding by bottle - Enfamil AR. Bleeding no bleeding. Bowel function is normal. Bladder function is normal. Patient is not sexually active. Contraception method is none. Patient is interested in BTL- papers previously signed. Postpartum depression screening: negative.  Patient reports that her bleeding stopped weeks ago and then bleeding rertarted 4-5 days ago and stopped and started again today.   The pregnancy intention screening data noted above was reviewed. Potential methods of contraception were discussed. The patient elected to proceed with No data recorded.   Edinburgh Postnatal Depression Scale - 09/28/23 1106       Edinburgh Postnatal Depression Scale:  In the Past 7 Days   I have been able to laugh and see the funny side of things. 0    I have looked forward with enjoyment to things. 0    I have blamed myself unnecessarily when things went wrong. 2    I have been anxious or worried for no good reason. 3    I have felt scared or panicky for no good reason. 0    Things have been getting on top of me. 2    I have been so unhappy that I have had difficulty sleeping. 0    I have felt sad or miserable. 0    I have been so unhappy that I have been crying. 0    The thought of harming myself has occurred to me. 0    Edinburgh Postnatal Depression Scale Total 7             Health Maintenance Due  Topic Date Due   Medicare Annual Wellness (AWV)  Never done   FOOT EXAM  Never done   OPHTHALMOLOGY EXAM  Never done   HPV VACCINES (3 - Risk 3-dose series) 08/03/2015   COVID-19 Vaccine (3  - Moderna risk series) 09/07/2020   HEMOGLOBIN A1C  08/25/2023    The following portions of the patient's history were reviewed and updated as appropriate: allergies, current medications, past family history, past medical history, past social history, past surgical history, and problem list.  Review of Systems Pertinent items are noted in HPI.  Objective:  BP (!) 136/93   Pulse 83   Ht 5\' 2"  (1.575 m)   Wt 172 lb (78 kg)   LMP 09/18/2023 (Approximate)   Breastfeeding No   BMI 31.46 kg/m    General:  alert and cooperative   Breasts:  not indicated  Lungs: clear to auscultation bilaterally  Heart:  regular rate and rhythm, S1, S2 normal, no murmur, click, rub or gallop  Abdomen: soft, non-tender; bowel sounds normal; no masses,  no organomegaly      GU exam:  not indicated       Assessment:    1. Postpartum care and examination  -Patient overall doing well. Reports feelings of anxiety and feeling overwhelmed.  - Reviewed normal hormonal changes.  - Patient previously on Atarax for anxiety. Patient restarted today. Meds sent to outpatient pharmacy for pick up.  - Amb ref to [facility]   2. Chronic hypertension affecting pregnancy - Patient did not take meds  today. Recommend to continue BP meds at this time.   3. Birth control counseling - Patient desires BTL. Scheduled for Interval Tubal Today.    Normal  postpartum exam.   Plan:   Essential components of care per ACOG recommendations:  1.  Mood and well being: Patient with negative depression screening today. Reviewed local resources for support.  - Patient tobacco use? No.   - hx of drug use? No.    2. Infant care and feeding:  -Patient currently breastmilk feeding? No.  -Social determinants of health (SDOH) reviewed in EPIC. No concerns  3. Sexuality, contraception and birth spacing - Patient does not want a pregnancy in the next year.  Desired family size is 2 children.  - Reviewed reproductive life planning.  Reviewed contraceptive methods based on pt preferences and effectiveness.  Patient desired Female Sterilization today.   - Discussed birth spacing of 18 months  4. Sleep and fatigue -Encouraged family/partner/community support of 4 hrs of uninterrupted sleep to help with mood and fatigue  5. Physical Recovery  - Discussed patients delivery and complications. She describes her labor as good. - Patient had a Vaginal, no problems at delivery. Patient had a  no  laceration. Perineal healing reviewed. Patient expressed understanding - Patient has urinary incontinence? No. - Patient is safe to resume physical and sexual activity at her desired time.   6.  Health Maintenance - HM due items addressed Yes - Last pap smear  Diagnosis  Date Value Ref Range Status  03/08/2022   Final   - Negative for intraepithelial lesion or malignancy (NILM)   Pap smear not done at today's visit.  -Breast Cancer screening indicated? No.   7. Chronic Disease/Pregnancy Condition follow up:  Chronic Hypertension.   - PCP follow up  Claudette Head, CNM [facility], [facility]

## 2023-10-02 ENCOUNTER — Telehealth: Payer: Self-pay | Admitting: Clinical

## 2023-10-02 ENCOUNTER — Telehealth: Payer: Self-pay

## 2023-10-02 NOTE — Telephone Encounter (Signed)
Attempt call regarding referral; Left HIPPA-compliant message to call back Mikle Sternberg from [facility] at  [phone] (Ashelyn Mccravy's office).    

## 2023-10-03 NOTE — Telephone Encounter (Signed)
Message received stating pt will need to resign BTL consent. PP visit note incomplete but appears patient is considering BTL. Called pt; VM left stating I am calling with follow up concerns. Callback number given. Office will attempt to contact patient a second time.

## 2023-10-04 NOTE — Telephone Encounter (Signed)
Called patient to request her to come to office to resign BTL forms. Patient did not answer. LM for patient to call the office in regards to consent. Will send My Chart message.

## 2023-10-10 ENCOUNTER — Other Ambulatory Visit: Payer: Self-pay | Admitting: Obstetrics & Gynecology

## 2023-10-10 DIAGNOSIS — O0990 Supervision of high risk pregnancy, unspecified, unspecified trimester: Secondary | ICD-10-CM

## 2023-10-24 ENCOUNTER — Other Ambulatory Visit: Payer: Self-pay | Admitting: Certified Nurse Midwife

## 2024-01-02 ENCOUNTER — Inpatient Hospital Stay (HOSPITAL_COMMUNITY): Admit: 2024-01-02 | Payer: Medicare HMO

## 2024-01-09 ENCOUNTER — Emergency Department (HOSPITAL_COMMUNITY)
Admission: EM | Admit: 2024-01-09 | Discharge: 2024-01-10 | Disposition: A | Discharge status: Home / Self Care | Payer: Medicare HMO | Attending: Emergency Medicine | Admitting: Emergency Medicine

## 2024-01-09 ENCOUNTER — Emergency Department (HOSPITAL_COMMUNITY): Payer: Medicare HMO

## 2024-01-09 ENCOUNTER — Other Ambulatory Visit: Payer: Self-pay

## 2024-01-09 DIAGNOSIS — Z7982 Long term (current) use of aspirin: Secondary | ICD-10-CM | POA: Diagnosis not present

## 2024-01-09 DIAGNOSIS — Z79899 Other long term (current) drug therapy: Secondary | ICD-10-CM | POA: Insufficient documentation

## 2024-01-09 DIAGNOSIS — M542 Cervicalgia: Secondary | ICD-10-CM | POA: Insufficient documentation

## 2024-01-09 DIAGNOSIS — I10 Essential (primary) hypertension: Secondary | ICD-10-CM | POA: Insufficient documentation

## 2024-01-09 DIAGNOSIS — R079 Chest pain, unspecified: Secondary | ICD-10-CM | POA: Diagnosis present

## 2024-01-09 LAB — CBC
HCT: 43.2 % (ref 36.0–46.0)
Hemoglobin: 14.2 g/dL (ref 12.0–15.0)
MCH: 27.4 pg (ref 26.0–34.0)
MCHC: 32.9 g/dL (ref 30.0–36.0)
MCV: 83.4 fL (ref 80.0–100.0)
Platelets: 184 10*3/uL (ref 150–400)
RBC: 5.18 MIL/uL — ABNORMAL HIGH (ref 3.87–5.11)
RDW: 11.9 % (ref 11.5–15.5)
WBC: 6 10*3/uL (ref 4.0–10.5)
nRBC: 0 % (ref 0.0–0.2)

## 2024-01-09 LAB — HCG, SERUM, QUALITATIVE: Preg, Serum: NEGATIVE

## 2024-01-09 NOTE — ED Triage Notes (Signed)
Patient reports left chest pain radiating to left shoulder and upper arm onset today , denies SOB , no emesis or diaphoresis .

## 2024-01-10 ENCOUNTER — Encounter (HOSPITAL_COMMUNITY): Payer: Self-pay

## 2024-01-10 DIAGNOSIS — R079 Chest pain, unspecified: Secondary | ICD-10-CM | POA: Diagnosis not present

## 2024-01-10 LAB — BASIC METABOLIC PANEL
Anion gap: 12 (ref 5–15)
BUN: 8 mg/dL (ref 6–20)
CO2: 27 mmol/L (ref 22–32)
Calcium: 9.1 mg/dL (ref 8.9–10.3)
Chloride: 101 mmol/L (ref 98–111)
Creatinine, Ser: 0.82 mg/dL (ref 0.44–1.00)
GFR, Estimated: >60 mL/min (ref >60)
Glucose, Bld: 123 mg/dL — ABNORMAL HIGH (ref 70–99)
Potassium: 3.4 mmol/L — ABNORMAL LOW (ref 3.5–5.1)
Sodium: 140 mmol/L (ref 135–145)

## 2024-01-10 LAB — TROPONIN I (HIGH SENSITIVITY)
Troponin I (High Sensitivity): <2 ng/L (ref <18)
Troponin I (High Sensitivity): <2 ng/L (ref <18)

## 2024-01-10 NOTE — ED Provider Notes (Signed)
EMERGENCY DEPARTMENT AT [facility] Provider Note   CSN: [id number] Arrival date & time: 01/09/24  2306     History  Chief Complaint  Patient presents with   Chest Pain    Kayla [name] is a 33 y.o. female.  33 year old female with a history of lupus, hypertension, and hyperlipidemia who presents emergency department with chest and neck discomfort.  Patient reports that she had sensation of reflux yesterday at 2 PM.  Says it got worse and started radiating into her chest, neck, and left arm.  7/10 in severity.  Waxes and wanes.  No exacerbating or alleviating factors.  Not exertional.  No diaphoresis with the pain or vomiting.  No personal history of MI.       Home Medications Prior to Admission medications   Medication Sig Start Date End Date Taking? Authorizing Provider  acetaminophen (TYLENOL) 500 MG tablet Take 1,000 mg by mouth every 6 (six) hours as needed.    [provider]  albuterol (VENTOLIN HFA) 108 (90 Base) MCG/ACT inhaler Inhale 2 puffs into the lungs every 6 (six) hours as needed. 09/26/23   [provider]  ASPIRIN LOW DOSE 81 MG tablet Take 81 mg by mouth daily. 09/26/23   [provider]  Boric Acid CRYS Place 500 mg vaginally at bedtime. Use vaginally every night for 21 days then 1 x per week for the remainder of the pills. 09/28/23   Carlynn Herald, CNM  cyclobenzaprine (FLEXERIL) 10 MG tablet Take 1 tablet (10 mg total) by mouth 2 (two) times daily as needed for muscle spasms. 05/03/23   Venora Maples, MD  esomeprazole (NEXIUM) 40 MG capsule Take 40 mg by mouth daily at 12 noon. 09/04/23 09/03/24  [provider]  fluticasone (CUTIVATE) 0.05 % cream Apply 1 Application topically as needed. 09/22/23   [provider]  hydroxychloroquine (PLAQUENIL) 200 MG tablet Take 200 mg by mouth 2 (two) times daily.    [provider]  hydrOXYzine (ATARAX) 10 MG tablet Take 1 tablet (10 mg  total) by mouth 3 (three) times daily as needed. 09/28/23   Carlynn Herald, CNM  ipratropium (ATROVENT HFA) 17 MCG/ACT inhaler Inhale 2 puffs into the lungs every 6 (six) hours as needed for wheezing. 05/04/23   Venora Maples, MD  NIFEdipine (ADALAT CC) 30 MG 24 hr tablet TAKE 1 TABLET(30 MG) BY MOUTH DAILY 10/24/23   Sandra Cockayne M, CNM  nystatin-triamcinolone [facility] II) cream Apply to affected area daily 08/03/23   Carlynn Herald, CNM  ondansetron (ZOFRAN-ODT) 4 MG disintegrating tablet Take 1 tablet (4 mg total) by mouth every 6 (six) hours as needed for nausea. 07/29/23   Anyanwu, Jethro Bastos, MD  oxyCODONE-acetaminophen (PERCOCET) 10-325 MG tablet Take 1 tablet by mouth 3 (three) times daily as needed. 08/29/23   [provider]  pantoprazole (PROTONIX) 40 MG tablet Take 1 tablet (40 mg total) by mouth 2 (two) times daily before a meal. 04/11/23   Venora Maples, MD  predniSONE (DELTASONE) 5 MG tablet Take 5 mg by mouth daily with breakfast.    [provider]  Prenatal 28-0.8 MG TABS Take 1 tablet by mouth daily. 02/23/23   Adam Phenix, MD  amLODipine (NORVASC) 5 MG tablet Take 1 tablet (5 mg total) by mouth daily. 09/22/17 11/09/20  Duayne Cal, NP      Allergies    Hydrocodone and Zithromax [azithromycin]    Review of Systems  Review of Systems  Physical Exam Updated Vital Signs BP 125/82   Pulse 85   Temp 98.5 F (36.9 C) (Oral)   Resp 18   SpO2 100%  Physical Exam Vitals and nursing note reviewed.  Constitutional:      General: She is not in acute distress.    Appearance: She is well-developed.  HENT:     Head: Normocephalic and atraumatic.     Right Ear: External ear normal.     Left Ear: External ear normal.     Nose: Nose normal.  Eyes:     Extraocular Movements: Extraocular movements intact.     Conjunctiva/sclera: Conjunctivae normal.     Pupils: Pupils are equal, round, and reactive to light.  Cardiovascular:     Rate  and Rhythm: Normal rate and regular rhythm.     Heart sounds: No murmur heard.    Comments: Radial pulse 2+ bilaterally Pulmonary:     Effort: Pulmonary effort is normal. No respiratory distress.     Breath sounds: Normal breath sounds.  Musculoskeletal:     Cervical back: Normal range of motion and neck supple.     Right lower leg: No edema.     Left lower leg: No edema.  Skin:    General: Skin is warm and dry.  Neurological:     Mental Status: She is alert and oriented to person, place, and time. Mental status is at baseline.  Psychiatric:        Mood and Affect: Mood normal.     ED Results / Procedures / Treatments   Labs (all labs ordered are listed, but only abnormal results are displayed) Labs Reviewed  BASIC METABOLIC PANEL - Abnormal; Notable for the following components:      Result Value   Potassium 3.4 (*)    Glucose, Bld 123 (*)    All other components within normal limits  CBC - Abnormal; Notable for the following components:   RBC 5.18 (*)    All other components within normal limits  HCG, SERUM, QUALITATIVE  TROPONIN I (HIGH SENSITIVITY)  TROPONIN I (HIGH SENSITIVITY)    EKG EKG Interpretation Date/Time:  Tuesday January 09 2024 23:27:49 EST Ventricular Rate:  77 PR Interval:  154 QRS Duration:  86 QT Interval:  416 QTC Calculation: 470 R Axis:   64  Text Interpretation: Normal sinus rhythm with sinus arrhythmia Nonspecific T wave abnormality Prolonged QT Abnormal ECG When compared with ECG of 12-Jul-2023 16:00, PREVIOUS ECG IS PRESENT Confirmed by Vonita Moss [phone]) on 01/10/2024 9:38:13 AM  Radiology DG Chest 2 View Result Date: 01/09/2024 CLINICAL DATA:  Chest pain. EXAM: CHEST - 2 VIEW COMPARISON:  08/25/2023 FINDINGS: The heart is normal in size. Improvement in bilateral suprahilar opacities, underlying perihilar scarring. Mild chronic interstitial coarsening. No evidence of acute airspace disease. No pleural fluid or pneumothorax. No acute  osseous findings. IMPRESSION: Chronic lung disease.  No acute findings. Electronically Signed   By: Narda Rutherford M.D.   On: 01/09/2024 23:43    Procedures Procedures    Medications Ordered in ED Medications - No data to display  ED Course/ Medical Decision Making/ A&P                                 Medical Decision Making Amount and/or Complexity of Data Reviewed Labs: ordered. Radiology: ordered.   Kayla [name] is a 33 y.o. female with comorbidities that complicate  the patient evaluation including lupus, hypertension, and hyperlipidemia who presents with chest pain  Initial Ddx:  MI, PE, pneumonia, dissection, pericarditis, costochondritis, reflux  MDM:  With the patient's chest discomfort will obtain EKG and troponins to evaluate for MI.  Also considering pulmonary embolism but patient does not have signs of a DVT and is not having significant shortness of breath or cough so feel this is less likely.  Considered dissection but with their symmetric pulses, history, and description of the pain feel it is less likely.  If chest x-ray reveals widened mediastinum or any other concerning findings will consider CTA.  Also considered pericarditis but description is unlikely and they do not have risk factors for this diagnosis.  Chest pain not reproducible so feel it costochondritis less likely.  No infectious symptoms to suggest pneumonia at this time that would be causing pleuritic chest pain.  Plan:  Labs Troponin EKG Chest x-ray  ED Summary/Re-evaluation:  EKG and serial troponins without signs of acute MI.  Chest x-ray unremarkable.  Remainder of her lab work is unremarkable.  Unclear exactly what is causing her symptoms but does not appear to consistent with ACS or any of the other above diagnoses.  May potentially be due to reflux.  Is already on any acid.  Will have her follow-up with her primary doctor and cardiology as an outpatient since she is at increased risk of ACS  because of her lupus.  This patient presents to the ED for concern of complaints listed in HPI, this involves an extensive number of treatment options, and is a complaint that carries with it a high risk of complications and morbidity. Disposition including potential need for admission considered.   Dispo: DC Home. Return precautions discussed including, but not limited to, those listed in the AVS. Allowed pt time to ask questions which were answered fully prior to dc.  Records reviewed Outpatient Clinic Notes The following labs were independently interpreted: Chemistry and show no acute abnormality I independently reviewed the following imaging with scope of interpretation limited to determining acute life threatening conditions related to emergency care: Chest x-ray and agree with the radiologist interpretation with the following exceptions: none I personally reviewed and interpreted cardiac monitoring: normal sinus rhythm  I personally reviewed and interpreted the pt's EKG: see above for interpretation  I have reviewed the patients home medications and made adjustments as needed   Final Clinical Impression(s) / ED Diagnoses Final diagnoses:  Chest pain, unspecified type    Rx / DC Orders ED Discharge Orders          Ordered    Ambulatory referral to Cardiology        01/10/24 0942              Rondel Baton, MD 01/10/24 [phone]

## 2024-01-10 NOTE — Discharge Instructions (Signed)
You were seen for your chest pain in the emergency department.   At home, please take tylenol for your pain.    Follow-up with your primary doctor in 2-3 days regarding your visit.  Cardiology will be calling you regarding an appointment within the next 72 hours.  You may contact them if you do not hear from them in that time using the information in this packet.  Return immediately to the emergency department if you experience any of the following: Worsening pain, difficulty breathing, unexplained vomiting or sweating, or any other concerning symptoms.    Thank you for visiting our Emergency Department. It was a pleasure taking care of you today.

## 2024-01-16 ENCOUNTER — Ambulatory Visit (INDEPENDENT_AMBULATORY_CARE_PROVIDER_SITE_OTHER): Payer: Medicare HMO | Admitting: Nurse Practitioner

## 2024-01-16 VITALS — BP 112/74 | HR 84 | Wt 174.0 lb

## 2024-01-16 DIAGNOSIS — N760 Acute vaginitis: Secondary | ICD-10-CM

## 2024-01-16 DIAGNOSIS — R350 Frequency of micturition: Secondary | ICD-10-CM

## 2024-01-16 DIAGNOSIS — B9689 Other specified bacterial agents as the cause of diseases classified elsewhere: Secondary | ICD-10-CM

## 2024-01-16 DIAGNOSIS — N898 Other specified noninflammatory disorders of vagina: Secondary | ICD-10-CM

## 2024-01-16 DIAGNOSIS — R110 Nausea: Secondary | ICD-10-CM

## 2024-01-16 LAB — WET PREP FOR TRICH, YEAST, CLUE

## 2024-01-16 MED ORDER — METRONIDAZOLE 500 MG PO TABS: 500.0000 mg | ORAL_TABLET | Freq: Two times a day (BID) | ORAL | 0 refills | Status: DC

## 2024-01-16 MED ORDER — ONDANSETRON 4 MG PO TBDP: 4.0000 mg | ORAL_TABLET | Freq: Two times a day (BID) | ORAL | 0 refills | Status: AC | PRN

## 2024-01-16 NOTE — Progress Notes (Signed)
   Acute Office Visit  Subjective:    [name], female    DOB: July 20, 1991, 33 y.o.   MRN: [id number]   HPI 33 y.o. presents today for vaginal discharge and odor x 3 days, urinary frequency x 10 days. Reports being treated for BV with Metrogel a few weeks ago. Has not tried anything OTC. Not sexually active.   Patient's last menstrual period was 01/07/2024 (approximate).    Review of Systems  Constitutional: Negative.   Genitourinary:  Positive for frequency and vaginal discharge. Negative for difficulty urinating, dysuria, flank pain, hematuria, pelvic pain, urgency and vaginal pain.       Odor       Objective:    Physical Exam Constitutional:      Appearance: Normal appearance.  Genitourinary:    General: Normal vulva.     Vagina: Vaginal discharge present. No erythema.     Cervix: Normal.     BP 112/74   Pulse 84   Wt 174 lb (78.9 kg)   LMP 01/07/2024 (Approximate)   SpO2 100%   Breastfeeding No   BMI 31.83 kg/m  Wt Readings from Last 3 Encounters:  01/16/24 174 lb (78.9 kg)  01/10/24 172 lb (78 kg)  09/28/23 172 lb (78 kg)        Patient informed chaperone available to be present for breast and/or pelvic exam. Patient has requested no chaperone to be present. Patient has been advised what will be completed during breast and pelvic exam.   Wet prep + clue cells (+ odor)  UA neg leukocytes, neg nitrites, 1+ protein, 1+ bilirubin, slightly cloudy/amber. Microscopic: wbc 0-5, rbc none, moderate bacteria, clue cells present  Assessment & Plan:   Problem List Items Addressed This Visit   None Visit Diagnoses       Bacterial vaginosis    -  Primary   Relevant Medications   metroNIDAZOLE (FLAGYL) 500 MG tablet     Frequent urination       Relevant Orders   Urinalysis,Complete w/RFL Culture     Vaginal discharge       Relevant Orders   WET PREP FOR TRICH, YEAST, CLUE     Nausea       Relevant Medications   ondansetron (ZOFRAN-ODT) 4 MG  disintegrating tablet      Plan: Wet prep positive for clue cells - Flagyl 500 mg BID x 7 days. Requesting Zofran. UA unremarkable, reflex culture pending.   Return if symptoms worsen or fail to improve.    [name] DNP, 10:00 AM 01/16/2024

## 2024-01-18 ENCOUNTER — Encounter: Payer: Self-pay | Admitting: Nurse Practitioner

## 2024-01-18 LAB — URINALYSIS, COMPLETE W/RFL CULTURE
Glucose, UA: NEGATIVE
Hgb urine dipstick: NEGATIVE
Hyaline Cast: NONE SEEN /LPF
Ketones, ur: NEGATIVE
Leukocyte Esterase: NEGATIVE
Nitrites, Initial: NEGATIVE
RBC / HPF: NONE SEEN /HPF (ref 0–2)
Specific Gravity, Urine: 1.033 (ref 1.001–1.035)
pH: 5.5 (ref 5.0–8.0)

## 2024-01-18 LAB — URINE CULTURE
MICRO NUMBER:: 16125729
Result:: NO GROWTH
SPECIMEN QUALITY:: ADEQUATE

## 2024-01-18 LAB — CULTURE INDICATED

## 2024-01-25 ENCOUNTER — Encounter: Payer: Self-pay | Admitting: Cardiology

## 2024-01-25 ENCOUNTER — Ambulatory Visit: Payer: Medicare HMO | Attending: Cardiology | Admitting: Cardiology

## 2024-01-25 VITALS — BP 114/80 | HR 86 | Ht 63.0 in | Wt 175.6 lb

## 2024-01-25 DIAGNOSIS — R7303 Prediabetes: Secondary | ICD-10-CM

## 2024-01-25 DIAGNOSIS — Z13220 Encounter for screening for lipoid disorders: Secondary | ICD-10-CM

## 2024-01-25 DIAGNOSIS — I10 Essential (primary) hypertension: Secondary | ICD-10-CM

## 2024-01-25 DIAGNOSIS — Z01812 Encounter for preprocedural laboratory examination: Secondary | ICD-10-CM

## 2024-01-25 DIAGNOSIS — R072 Precordial pain: Secondary | ICD-10-CM

## 2024-01-25 MED ORDER — METOPROLOL TARTRATE 100 MG PO TABS: ORAL_TABLET | ORAL | 0 refills | Status: DC

## 2024-01-25 NOTE — Patient Instructions (Addendum)
 Medication Instructions:  Your physician recommends that you continue on your current medications as directed. Please refer to the Current Medication list given to you today.  *If you need a refill on your cardiac medications before your next appointment, please call your pharmacy*   Lab Work: Lipids, CMET, Mag, HgbA1c If you have labs (blood work) drawn today and your tests are completely normal, you will receive your results only by: MyChart Message (if you have MyChart) OR A paper copy in the mail If you have any lab test that is abnormal or we need to change your treatment, we will call you to review the results.   Testing/Procedures: Your cardiac CT will be scheduled at one of the below locations:   [facility] [address] [place], [place] [address] [phone]  If scheduled at [facility], please arrive at the [facility] (Entrance C2) of [facility] 30 minutes prior to test start time. You can use the FREE valet parking offered at entrance C (encouraged to control the heart rate for the test)  Proceed to the [facility] Radiology Department (first floor) to check-in and test prep.  All radiology patients and guests should use entrance C2 at [facility], accessed from [facility], even though the hospital's physical address listed is [address]    Please follow these instructions carefully (unless otherwise directed):  An IV will be required for this test and Nitroglycerin will be given.   On the Night Before the Test: Be sure to Drink plenty of water. Do not consume any caffeinated/decaffeinated beverages or chocolate 12 hours prior to your test. Do not take any antihistamines 12 hours prior to your test.  On the Day of the Test: Drink plenty of water until 1 hour prior to the test. Do not eat any food 1 hour prior to test. You may take your regular medications prior to the test.   Take metoprolol (Lopressor) two hours prior to test. If you take Furosemide/Hydrochlorothiazide/Spironolactone/Chlorthalidone, please HOLD on the morning of the test. Patients who wear a continuous glucose monitor MUST remove the device prior to scanning. FEMALES- please wear underwire-free bra if available, avoid dresses & tight clothing       After the Test: Drink plenty of water. After receiving IV contrast, you may experience a mild flushed feeling. This is normal. On occasion, you may experience a mild rash up to 24 hours after the test. This is not dangerous. If this occurs, you can take Benadryl 25 mg, Zyrtec, Claritin, or Allegra and increase your fluid intake. (Patients taking Tikosyn should avoid Benadryl, and may take Zyrtec, Claritin, or Allegra) If you experience trouble breathing, this can be serious. If it is severe call 911 IMMEDIATELY. If it is mild, please call our office.  We will call to schedule your test 2-4 weeks out understanding that some insurance companies will need an authorization prior to the service being performed.   For more information and frequently asked questions, please visit our website : [email or web address]  For non-scheduling related questions, please contact the cardiac imaging nurse navigator should you have any questions/concerns: Cardiac Imaging Nurse Navigators Direct Office Dial: [phone]   For scheduling needs, including cancellations and rescheduling, please call [place], [phone].   Follow-Up: At [facility], you and your health needs are our priority.  As part of our continuing mission to provide you with exceptional heart care, we have created designated Provider Care Teams.  These Care Teams include your primary Cardiologist (physician) and Advanced Practice Providers (APPs -  Physician Assistants and Nurse Practitioners) who all work together to provide you with the care you need, when you need  it.   Your next appointment:   6 month(s)  Provider:   Thomasene Ripple, DO

## 2024-01-25 NOTE — Progress Notes (Signed)
 Cardio-Obstetrics Clinic  New Evaluation  Date:  01/25/2024   ID:  Kayla [name], DOB 11/30/1990, MRN [id number]  PCP:  Salli Real, MD   [facility] Providers Cardiologist:  None  Electrophysiologist:  None       Referring MD: Rondel Baton, MD   Chief Complaint: " I am doing well"  History of Present Illness:    Kayla [name] is a 33 y.o. female [G5P2122] who is being seen today for the evaluation of hx of hypertension at the request of Rondel Baton, MD.   Medical hx with pulmonary fibrosis, Hypertension, prediabetes, Lupus and GERD her for to establish cardiac care in pregnancy.   She presents with recent episodes of chest discomfort and shortness of breath. She describes waking up in the middle of the night feeling hot, gasping for air, and experiencing pain that radiated from her neck to her shoulder. The discomfort was relieved by fresh air but left her feeling anxious and afraid to sleep. She experienced a similar episode the previous night and a week prior, she had a sensation of chest tightness. She sought emergency care for these symptoms and was advised to follow up in clinic. She also reports a persistent sharp pain in an unspecified location since the initial episode. She denies taking her prescribed amlodipine and nifedipine for the past two weeks, stating her blood pressure was fine. She denies any recent blood work since being diagnosed as prediabetic in April.   Prior CV Studies Reviewed: The following studies were reviewed today:   Past Medical History:  Diagnosis Date   Acute respiratory failure with hypoxemia (HCC) 09/19/2017   Acute respiratory failure with hypoxia (HCC)    Arthritis    "hands and legs" (01/08/2015)   CAP (community acquired pneumonia) 01/07/2015   Chronic Respiratory failure with oxygen requirement affecting pregnancy, antepartum 05/16/2016   Resolved pulmonary HTN on 2018 echo (in our epic).   Pt off O2 in 2018.   Daily  headache    "sometimes" (01/08/2015)   Depression    2017   Gastroesophageal reflux disease without esophagitis 07/13/2020   GERD (gastroesophageal reflux disease)    Hypertension    No longer takes meds   ILD (interstitial lung disease) (HCC)    Insomnia 08/21/2015   Loud P2 (pulmonary S2, second heart sound) 08/21/2015   Lung disease    Lupus    Lupus (systemic lupus erythematosus) (HCC) 08/21/2015   Multifocal pneumonia    Pulmonary hypertension (HCC)    Seasonal allergies 03/02/2023   Sjogren's syndrome (HCC)    SS-A antibody positive 06/02/2016   SS-B antibody positive 06/02/2016   STD (sexually transmitted disease)    Chlamydia   Vaginal Pap smear, abnormal    repeats have been ok   Viral illness 03/02/2023    Past Surgical History:  Procedure Laterality Date   CARDIAC CATHETERIZATION N/A 09/09/2015   Procedure: Right Heart Cath;  Surgeon: Laurey Morale, MD;  Location: [facility] INVASIVE CV LAB;  Service: Cardiovascular;  Laterality: N/A;   DILATION AND EVACUATION N/A 07/13/2015   Procedure: DILATATION AND EVACUATION;  Surgeon: Levie Heritage, DO;  Location: [facility] ORS;  Service: Gynecology;  Laterality: N/A;   FINGER SURGERY Right 03/2014   "laceration, nerve/artery injury" 2nd digit   VIDEO BRONCHOSCOPY Bilateral 01/12/2015   Procedure: VIDEO BRONCHOSCOPY WITH FLUORO;  Surgeon: Leslye Peer, MD;  Location: [facility] ENDOSCOPY;  Service: Cardiopulmonary;  Laterality: Bilateral;      OB History  Gravida  5   Para  3   Term  2   Preterm  1   AB  2   Living  2      SAB  2   IAB  0   Ectopic  0   Multiple  0   Live Births  2               Current Medications: Current Meds  Medication Sig   acetaminophen (TYLENOL) 500 MG tablet Take 1,000 mg by mouth every 6 (six) hours as needed.   albuterol (VENTOLIN HFA) 108 (90 Base) MCG/ACT inhaler Inhale 2 puffs into the lungs every 6 (six) hours as needed.   ASPIRIN LOW DOSE 81 MG tablet Take 81 mg by mouth  daily.   Boric Acid CRYS Place 500 mg vaginally at bedtime. Use vaginally every night for 21 days then 1 x per week for the remainder of the pills.   cyclobenzaprine (FLEXERIL) 10 MG tablet Take 1 tablet (10 mg total) by mouth 2 (two) times daily as needed for muscle spasms.   esomeprazole (NEXIUM) 40 MG capsule Take 40 mg by mouth daily at 12 noon.   hydroxychloroquine (PLAQUENIL) 200 MG tablet Take 200 mg by mouth 2 (two) times daily.   hydrOXYzine (ATARAX) 10 MG tablet Take 1 tablet (10 mg total) by mouth 3 (three) times daily as needed.   ipratropium (ATROVENT HFA) 17 MCG/ACT inhaler Inhale 2 puffs into the lungs every 6 (six) hours as needed for wheezing.   lisinopril (ZESTRIL) 5 MG tablet Take 5 mg by mouth daily.   metoprolol tartrate (LOPRESSOR) 100 MG tablet Take 2 hours prior to CT   NIFEdipine (ADALAT CC) 30 MG 24 hr tablet TAKE 1 TABLET(30 MG) BY MOUTH DAILY   ondansetron (ZOFRAN-ODT) 4 MG disintegrating tablet Take 1 tablet (4 mg total) by mouth 2 (two) times daily as needed for nausea.   oxyCODONE-acetaminophen (PERCOCET) 10-325 MG tablet Take 1 tablet by mouth 3 (three) times daily as needed.   pantoprazole (PROTONIX) 40 MG tablet Take 1 tablet (40 mg total) by mouth 2 (two) times daily before a meal.   predniSONE (DELTASONE) 5 MG tablet Take 5 mg by mouth daily with breakfast.   Prenatal 28-0.8 MG TABS Take 1 tablet by mouth daily.   Vitamin D, Ergocalciferol, (DRISDOL) 1.25 MG (50000 UNIT) CAPS capsule Take 50,000 Units by mouth once a week.     Allergies:   Hydrocodone and Zithromax [azithromycin]   Social History   Socioeconomic History   Marital status: Single    Spouse name: Not on file   Number of children: Not on file   Years of education: Not on file   Highest education level: Not on file  Occupational History   Occupation: [employer]     Comment: Workers Compensation  Tobacco Use   Smoking status: Former    Current packs/day: 0.00    Average packs/day: 0.1  packs/day for 5.0 years (0.5 ttl pk-yrs)    Types: Cigarettes    Start date: 11/20/2009    Quit date: 11/20/2014    Years since quitting: 9.1   Smokeless tobacco: Never  Vaping Use   Vaping status: Never Used  Substance and Sexual Activity   Alcohol use: Not Currently    Comment: Occas   Drug use: No   Sexual activity: Not Currently    Birth control/protection: None    Comment: First IC <15 y/o, Hx of CT+  Other Topics Concern  Not on file  Social History Narrative   Not on file   Social Drivers of Health   Financial Resource Strain: Not on file  Food Insecurity: No Food Insecurity (08/25/2023)   Hunger Vital Sign    Worried About Running Out of Food in the Last Year: Never true    Ran Out of Food in the Last Year: Never true  Transportation Needs: No Transportation Needs (08/25/2023)   PRAPARE - Administrator, Civil Service (Medical): No    Lack of Transportation (Non-Medical): No  Physical Activity: Not on file  Stress: Not on file  Social Connections: Not on file      Family History  Problem Relation Age of Onset   Hypertension Mother    Deep vein thrombosis Mother    Arthritis Father    Cancer Brother        Found in jaw area   Diabetes Maternal Aunt    Stroke Maternal Grandmother    Hypertension Maternal Grandmother    Diabetes Maternal Grandmother    Hypertension Maternal Grandfather       ROS:   Please see the history of present illness.     All other systems reviewed and are negative.   Labs/EKG Reviewed:    EKG:   EKG is was not ordered today.   Recent Labs: 07/12/2023: B Natriuretic Peptide 17.3 08/25/2023: ALT 28 08/26/2023: Magnesium 2.5 01/09/2024: BUN 8; Creatinine, Ser 0.82; Hemoglobin 14.2; Platelets 184; Potassium 3.4; Sodium 140   Recent Lipid Panel Lab Results  Component Value Date/Time   TRIG 120 09/19/2017 05:16 AM    Physical Exam:    VS:  BP 114/80 (BP Location: Right Arm, Patient Position: Sitting, Cuff Size:  Normal)   Pulse 86   Ht 5\' 3"  (1.6 m)   Wt 175 lb 9.6 oz (79.7 kg)   LMP 01/07/2024 (Approximate)   SpO2 98%   BMI 31.11 kg/m     Wt Readings from Last 3 Encounters:  01/25/24 175 lb 9.6 oz (79.7 kg)  01/16/24 174 lb (78.9 kg)  01/10/24 172 lb (78 kg)     GEN:  Well nourished, well developed in no acute distress HEENT: Normal NECK: No JVD; No carotid bruits LYMPHATICS: No lymphadenopathy CARDIAC: RRR, no murmurs, rubs, gallops RESPIRATORY:  Clear to auscultation without rales, wheezing or rhonchi  ABDOMEN: Soft, non-tender, non-distended MUSCULOSKELETAL:  No edema; No deformity  SKIN: Warm and dry NEUROLOGIC:  Alert and oriented x 3 PSYCHIATRIC:  Normal affect    Risk Assessment/Risk Calculators:                  ASSESSMENT & PLAN:    Precordial chest pain  Hx of hypertension Pulmonary fibrosis  Obesity in pregnancy  Chest Pain Recent symptoms of chest pain is concerning, this patient does have intermediate risk for coronary artery disease and at this time I would like to pursue an ischemic evaluation in this patient.  Shared decision a coronary CTA at this time is appropriate.  I have discussed with the patient about the testing.  The patient has no IV contrast allergy and is agreeable to proceed with this test.  Hypertension Patient is not currently taking prescribed antihypertensive medications (Amlodipine, Lisinopril, Nifedipine), blood pressure is below target.  Will not resume-Encourage patient to resume antihypertensive medications as prescribed.  Prediabetes Last blood work in April indicated prediabetes. No recent follow-up testing. -Order repeat blood work including glucose and lipid screening.  Follow-up in six months  or sooner if cardiac testing results are abnormal.   Patient Instructions  Medication Instructions:  Your physician recommends that you continue on your current medications as directed. Please refer to the Current Medication list  given to you today.  *If you need a refill on your cardiac medications before your next appointment, please call your pharmacy*   Lab Work: Lipids, CMET, Mag, HgbA1c If you have labs (blood work) drawn today and your tests are completely normal, you will receive your results only by: MyChart Message (if you have MyChart) OR A paper copy in the mail If you have any lab test that is abnormal or we need to change your treatment, we will call you to review the results.   Testing/Procedures: Your cardiac CT will be scheduled at one of the below locations:   [facility] [address] [place], [place] [address] [phone]  If scheduled at [facility], please arrive at the [facility] Entrance (Entrance C2) of [facility] 30 minutes prior to test start time. You can use the FREE valet parking offered at entrance C (encouraged to control the heart rate for the test)  Proceed to the [facility] Radiology Department (first floor) to check-in and test prep.  All radiology patients and guests should use entrance C2 at [facility], accessed from [facility], even though the hospital's physical address listed is [address]    Please follow these instructions carefully (unless otherwise directed):  An IV will be required for this test and Nitroglycerin will be given.   On the Night Before the Test: Be sure to Drink plenty of water. Do not consume any caffeinated/decaffeinated beverages or chocolate 12 hours prior to your test. Do not take any antihistamines 12 hours prior to your test.  On the Day of the Test: Drink plenty of water until 1 hour prior to the test. Do not eat any food 1 hour prior to test. You may take your regular medications prior to the test.  Take metoprolol (Lopressor) two hours prior to test. If you take Furosemide/Hydrochlorothiazide/Spironolactone/Chlorthalidone, please HOLD on the morning of  the test. Patients who wear a continuous glucose monitor MUST remove the device prior to scanning. FEMALES- please wear underwire-free bra if available, avoid dresses & tight clothing       After the Test: Drink plenty of water. After receiving IV contrast, you may experience a mild flushed feeling. This is normal. On occasion, you may experience a mild rash up to 24 hours after the test. This is not dangerous. If this occurs, you can take Benadryl 25 mg, Zyrtec, Claritin, or Allegra and increase your fluid intake. (Patients taking Tikosyn should avoid Benadryl, and may take Zyrtec, Claritin, or Allegra) If you experience trouble breathing, this can be serious. If it is severe call 911 IMMEDIATELY. If it is mild, please call our office.  We will call to schedule your test 2-4 weeks out understanding that some insurance companies will need an authorization prior to the service being performed.   For more information and frequently asked questions, please visit our website : [email or web address]  For non-scheduling related questions, please contact the cardiac imaging nurse navigator should you have any questions/concerns: Cardiac Imaging Nurse Navigators Direct Office Dial: [phone]   For scheduling needs, including cancellations and rescheduling, please call [place], [phone].   Follow-Up: At [facility], you and your health needs are our priority.  As part of our continuing mission  to provide you with exceptional heart care, we have created designated Provider Care Teams.  These Care Teams include your primary Cardiologist (physician) and Advanced Practice Providers (APPs -  Physician Assistants and Nurse Practitioners) who all work together to provide you with the care you need, when you need it.   Your next appointment:   6 month(s)  Provider:   Thomasene Ripple, DO    Dispo:  Return in about 6 months (around 07/27/2024).   Medication Adjustments/Labs  and Tests Ordered: Current medicines are reviewed at length with the patient today.  Concerns regarding medicines are outlined above.  Tests Ordered: Orders Placed This Encounter  Procedures   CT CORONARY MORPH W/CTA COR W/SCORE W/CA W/CM &/OR WO/CM   Lipid panel   Comprehensive Metabolic Panel (CMET)   Magnesium   Hemoglobin A1c   Medication Changes: Meds ordered this encounter  Medications   metoprolol tartrate (LOPRESSOR) 100 MG tablet    Sig: Take 2 hours prior to CT    Dispense:  1 tablet    Refill:  0

## 2024-01-26 LAB — COMPREHENSIVE METABOLIC PANEL
ALT: 8 IU/L (ref 0–32)
AST: 14 IU/L (ref 0–40)
Albumin: 4.3 g/dL (ref 3.9–4.9)
Alkaline Phosphatase: 70 IU/L (ref 44–121)
BUN/Creatinine Ratio: 11 (ref 9–23)
BUN: 9 mg/dL (ref 6–20)
Bilirubin Total: <0.2 mg/dL (ref 0.0–1.2)
CO2: 24 mmol/L (ref 20–29)
Calcium: 9.1 mg/dL (ref 8.7–10.2)
Chloride: 103 mmol/L (ref 96–106)
Creatinine, Ser: 0.83 mg/dL (ref 0.57–1.00)
Globulin, Total: 1.9 g/dL (ref 1.5–4.5)
Glucose: 85 mg/dL (ref 70–99)
Potassium: 4.1 mmol/L (ref 3.5–5.2)
Sodium: 142 mmol/L (ref 134–144)
Total Protein: 6.2 g/dL (ref 6.0–8.5)
eGFR: 95 mL/min/{1.73_m2} (ref >59)

## 2024-01-26 LAB — LIPID PANEL
Chol/HDL Ratio: 2.7 ratio (ref 0.0–4.4)
Cholesterol, Total: 164 mg/dL (ref 100–199)
HDL: 61 mg/dL (ref >39)
LDL Chol Calc (NIH): 85 mg/dL (ref 0–99)
Triglycerides: 98 mg/dL (ref 0–149)
VLDL Cholesterol Cal: 18 mg/dL (ref 5–40)

## 2024-01-26 LAB — HEMOGLOBIN A1C
Est. average glucose Bld gHb Est-mCnc: 103 mg/dL
Hgb A1c MFr Bld: 5.2 % (ref 4.8–5.6)

## 2024-01-26 LAB — MAGNESIUM: Magnesium: 2 mg/dL (ref 1.6–2.3)

## 2024-01-29 ENCOUNTER — Encounter: Payer: Self-pay | Admitting: Cardiology

## 2024-02-06 ENCOUNTER — Ambulatory Visit (INDEPENDENT_AMBULATORY_CARE_PROVIDER_SITE_OTHER): Admitting: Nurse Practitioner

## 2024-02-06 ENCOUNTER — Encounter: Payer: Self-pay | Admitting: Nurse Practitioner

## 2024-02-06 VITALS — BP 118/82 | HR 63

## 2024-02-06 DIAGNOSIS — N760 Acute vaginitis: Secondary | ICD-10-CM | POA: Diagnosis not present

## 2024-02-06 DIAGNOSIS — N898 Other specified noninflammatory disorders of vagina: Secondary | ICD-10-CM

## 2024-02-06 DIAGNOSIS — B9689 Other specified bacterial agents as the cause of diseases classified elsewhere: Secondary | ICD-10-CM

## 2024-02-06 LAB — WET PREP FOR TRICH, YEAST, CLUE

## 2024-02-06 MED ORDER — NUVESSA 1.3 % VA GEL: 5.0000 g | Freq: Once | VAGINAL | 0 refills | Status: AC

## 2024-02-06 NOTE — Progress Notes (Signed)
   Acute Office Visit  Subjective:    [name], female    DOB: 04/26/1991, 33 y.o.   MRN: [id number]   HPI 33 y.o. presents today for vaginal discharge and odor. Treated for BV 01/16/2024 with Flagyl - felt symptoms went away and came back. Reports being treated for BV 3 weeks prior to that with Metrogel. Not sexually active. Denies any changes in soaps, detergents, no recent antibiotic use, no baths.   Patient's last menstrual period was 01/26/2024 (approximate).    Review of Systems  Constitutional: Negative.   Genitourinary:  Positive for vaginal discharge. Negative for vaginal pain.       Vaginal odor       Objective:    Physical Exam Constitutional:      Appearance: Normal appearance.  Genitourinary:    General: Normal vulva.     Vagina: Vaginal discharge present. No erythema.     Cervix: Normal.     BP 118/82   Pulse 63   LMP 01/26/2024 (Approximate)   SpO2 96%  Wt Readings from Last 3 Encounters:  01/25/24 175 lb 9.6 oz (79.7 kg)  01/16/24 174 lb (78.9 kg)  01/10/24 172 lb (78 kg)        Patient informed chaperone available to be present for breast and/or pelvic exam. Patient has requested no chaperone to be present. Patient has been advised what will be completed during breast and pelvic exam.   Wet prep + clue cells (+ odor)  Assessment & Plan:   Problem List Items Addressed This Visit   None Visit Diagnoses       Bacterial vaginosis    -  Primary   Relevant Medications   metroNIDAZOLE (NUVESSA) 1.3 % GEL     Recurrent vaginitis       Relevant Orders   Mycoplasma / ureaplasma culture   WET PREP FOR TRICH, YEAST, CLUE     Vaginal odor       Relevant Orders   WET PREP FOR TRICH, YEAST, CLUE      Plan: Wet prep positive for clue cells - Nuvessa 1.3% vaginal gel once. Will check Ureaplasma/mycoplasma d/t recurrences.   Return if symptoms worsen or fail to improve.    [name] DNP, 3:20 PM 02/06/2024

## 2024-02-07 ENCOUNTER — Telehealth (HOSPITAL_COMMUNITY): Payer: Self-pay | Admitting: *Deleted

## 2024-02-07 ENCOUNTER — Encounter: Payer: Self-pay | Admitting: Nurse Practitioner

## 2024-02-07 ENCOUNTER — Telehealth: Admitting: Physician Assistant

## 2024-02-07 DIAGNOSIS — B9689 Other specified bacterial agents as the cause of diseases classified elsewhere: Secondary | ICD-10-CM | POA: Diagnosis not present

## 2024-02-07 DIAGNOSIS — N760 Acute vaginitis: Secondary | ICD-10-CM

## 2024-02-07 NOTE — Telephone Encounter (Signed)
 Reaching out to patient to offer assistance regarding upcoming cardiac imaging study; pt verbalizes understanding of appt date/time, parking situation and where to check in, pre-test NPO status and medications ordered, and verified current allergies; name and call back number provided for further questions should they arise Johney Frame RN Navigator Cardiac Imaging [facility] [phone] office [phone] cell

## 2024-02-07 NOTE — Progress Notes (Signed)
 No answer from patient with additional questions sent earlier. Will close visit for now as it is end of shift. Will reopen when she replies.

## 2024-02-08 ENCOUNTER — Encounter (HOSPITAL_COMMUNITY): Payer: Self-pay

## 2024-02-08 ENCOUNTER — Emergency Department (HOSPITAL_COMMUNITY)
Admission: EM | Admit: 2024-02-08 | Discharge: 2024-02-08 | Disposition: A | Discharge status: Home / Self Care | Attending: Emergency Medicine | Admitting: Emergency Medicine

## 2024-02-08 ENCOUNTER — Other Ambulatory Visit: Payer: Self-pay

## 2024-02-08 ENCOUNTER — Emergency Department (HOSPITAL_COMMUNITY)

## 2024-02-08 ENCOUNTER — Ambulatory Visit (HOSPITAL_BASED_OUTPATIENT_CLINIC_OR_DEPARTMENT_OTHER)
Admission: RE | Admit: 2024-02-08 | Discharge: 2024-02-08 | Disposition: A | Discharge status: Home / Self Care | Source: Ambulatory Visit | Attending: Cardiology | Admitting: Cardiology

## 2024-02-08 DIAGNOSIS — R079 Chest pain, unspecified: Secondary | ICD-10-CM | POA: Diagnosis present

## 2024-02-08 DIAGNOSIS — M5442 Lumbago with sciatica, left side: Secondary | ICD-10-CM | POA: Insufficient documentation

## 2024-02-08 DIAGNOSIS — M3500 Sicca syndrome, unspecified: Secondary | ICD-10-CM | POA: Diagnosis not present

## 2024-02-08 DIAGNOSIS — I2510 Atherosclerotic heart disease of native coronary artery without angina pectoris: Secondary | ICD-10-CM | POA: Diagnosis not present

## 2024-02-08 DIAGNOSIS — Z7982 Long term (current) use of aspirin: Secondary | ICD-10-CM | POA: Insufficient documentation

## 2024-02-08 DIAGNOSIS — I4510 Unspecified right bundle-branch block: Secondary | ICD-10-CM | POA: Insufficient documentation

## 2024-02-08 DIAGNOSIS — R072 Precordial pain: Secondary | ICD-10-CM

## 2024-02-08 DIAGNOSIS — M5441 Lumbago with sciatica, right side: Secondary | ICD-10-CM | POA: Insufficient documentation

## 2024-02-08 DIAGNOSIS — Z881 Allergy status to other antibiotic agents status: Secondary | ICD-10-CM | POA: Insufficient documentation

## 2024-02-08 DIAGNOSIS — I272 Pulmonary hypertension, unspecified: Secondary | ICD-10-CM | POA: Diagnosis not present

## 2024-02-08 DIAGNOSIS — I10 Essential (primary) hypertension: Secondary | ICD-10-CM | POA: Insufficient documentation

## 2024-02-08 DIAGNOSIS — R0789 Other chest pain: Secondary | ICD-10-CM | POA: Insufficient documentation

## 2024-02-08 DIAGNOSIS — R0602 Shortness of breath: Secondary | ICD-10-CM | POA: Insufficient documentation

## 2024-02-08 DIAGNOSIS — Z79899 Other long term (current) drug therapy: Secondary | ICD-10-CM | POA: Insufficient documentation

## 2024-02-08 DIAGNOSIS — Z885 Allergy status to narcotic agent status: Secondary | ICD-10-CM | POA: Insufficient documentation

## 2024-02-08 LAB — CBC
HCT: 44.7 % (ref 36.0–46.0)
Hemoglobin: 14.3 g/dL (ref 12.0–15.0)
MCH: 27.2 pg (ref 26.0–34.0)
MCHC: 32 g/dL (ref 30.0–36.0)
MCV: 85 fL (ref 80.0–100.0)
Platelets: 190 10*3/uL (ref 150–400)
RBC: 5.26 MIL/uL — ABNORMAL HIGH (ref 3.87–5.11)
RDW: 12 % (ref 11.5–15.5)
WBC: 6.2 10*3/uL (ref 4.0–10.5)
nRBC: 0 % (ref 0.0–0.2)

## 2024-02-08 LAB — BASIC METABOLIC PANEL
Anion gap: 11 (ref 5–15)
BUN: 5 mg/dL — ABNORMAL LOW (ref 6–20)
CO2: 23 mmol/L (ref 22–32)
Calcium: 9.2 mg/dL (ref 8.9–10.3)
Chloride: 103 mmol/L (ref 98–111)
Creatinine, Ser: 0.7 mg/dL (ref 0.44–1.00)
GFR, Estimated: >60 mL/min (ref >60)
Glucose, Bld: 107 mg/dL — ABNORMAL HIGH (ref 70–99)
Potassium: 3.3 mmol/L — ABNORMAL LOW (ref 3.5–5.1)
Sodium: 137 mmol/L (ref 135–145)

## 2024-02-08 LAB — TROPONIN I (HIGH SENSITIVITY)
Troponin I (High Sensitivity): <2 ng/L (ref <18)
Troponin I (High Sensitivity): <2 ng/L (ref <18)

## 2024-02-08 LAB — RESP PANEL BY RT-PCR (RSV, FLU A&B, COVID)  RVPGX2
Influenza A by PCR: NEGATIVE
Influenza B by PCR: NEGATIVE
Resp Syncytial Virus by PCR: NEGATIVE
SARS Coronavirus 2 by RT PCR: NEGATIVE

## 2024-02-08 LAB — D-DIMER, QUANTITATIVE: D-Dimer, Quant: <0.27 ug{FEU}/mL (ref 0.00–0.50)

## 2024-02-08 LAB — HCG, SERUM, QUALITATIVE: Preg, Serum: NEGATIVE

## 2024-02-08 MED ORDER — IOHEXOL 350 MG/ML SOLN
95.0000 mL | Freq: Once | INTRAVENOUS | Status: AC | PRN
  Administered 2024-02-08: 95 mL via INTRAVENOUS

## 2024-02-08 MED ORDER — CLINDAMYCIN PHOSPHATE 2 % VA CREA: 1.0000 | TOPICAL_CREAM | Freq: Every day | VAGINAL | 0 refills | Status: DC

## 2024-02-08 MED ORDER — KETOROLAC TROMETHAMINE 15 MG/ML IJ SOLN
15.0000 mg | Freq: Once | INTRAMUSCULAR | Status: AC
  Administered 2024-02-08: 15 mg via INTRAMUSCULAR
  Filled 2024-02-08: qty 1

## 2024-02-08 MED ORDER — NITROGLYCERIN 0.4 MG SL SUBL
0.8000 mg | SUBLINGUAL_TABLET | Freq: Once | SUBLINGUAL | Status: AC
  Administered 2024-02-08: 0.8 mg via SUBLINGUAL

## 2024-02-08 MED ORDER — POTASSIUM CHLORIDE CRYS ER 20 MEQ PO TBCR
40.0000 meq | EXTENDED_RELEASE_TABLET | Freq: Once | ORAL | Status: AC
  Administered 2024-02-08: 40 meq via ORAL
  Filled 2024-02-08: qty 2

## 2024-02-08 MED ORDER — NITROGLYCERIN 0.4 MG SL SUBL
SUBLINGUAL_TABLET | SUBLINGUAL | Status: AC
  Filled 2024-02-08: qty 2

## 2024-02-08 NOTE — Addendum Note (Signed)
 Addended by: Waldon Merl on: 02/08/2024 07:09 AM   Modules accepted: Orders

## 2024-02-08 NOTE — ED Triage Notes (Signed)
 Pt came in via POV d/t yesterday beginning to feel light headed with ringing in her ears & reports BP was 131/99. Could not sleep last night from SOB when she laid down with lower-posterior bil leg pain & back "burning." The last few weeks her Lt arm/shoulder has been bothering her as well with Lt sided Chest tightness. A/Ox4, rates her pain 8/10 during triage.

## 2024-02-08 NOTE — Progress Notes (Signed)
E-Visit for Vaginal Symptoms  We are sorry that you are not feeling well. Here is how we plan to help! Based on what you shared with me it looks like you: May have a vaginosis due to bacteria  Vaginosis is an inflammation of the vagina that can result in discharge, itching and pain. The cause is usually a change in the normal balance of vaginal bacteria or an infection. Vaginosis can also result from reduced estrogen levels after menopause.  The most common causes of vaginosis are:   Bacterial vaginosis which results from an overgrowth of one on several organisms that are normally present in your vagina.   Yeast infections which are caused by a naturally occurring fungus called candida.   Vaginal atrophy (atrophic vaginosis) which results from the thinning of the vagina from reduced estrogen levels after menopause.   Trichomoniasis which is caused by a parasite and is commonly transmitted by sexual intercourse.  Factors that increase your risk of developing vaginosis include: Medications, such as antibiotics and steroids Uncontrolled diabetes Use of hygiene products such as bubble bath, vaginal spray or vaginal deodorant Douching Wearing damp or tight-fitting clothing Using an intrauterine device (IUD) for birth control Hormonal changes, such as those associated with pregnancy, birth control pills or menopause Sexual activity Having a sexually transmitted infection  Your treatment plan is Clindamycin vaginal cream 5 grams applied vaginally for 7 days.  I have electronically sent this prescription into the pharmacy that you have chosen.  Be sure to take all of the medication as directed. Stop taking any medication if you develop a rash, tongue swelling or shortness of breath. Mothers who are breast feeding should consider pumping and discarding their breast milk while on these antibiotics. However, there is no consensus that infant exposure at these doses would be harmful.  Remember  that medication creams can weaken latex condoms. .   HOME CARE:  Good hygiene may prevent some types of vaginosis from recurring and may relieve some symptoms:  Avoid baths, hot tubs and whirlpool spas. Rinse soap from your outer genital area after a shower, and dry the area well to prevent irritation. Don't use scented or harsh soaps, such as those with deodorant or antibacterial action. Avoid irritants. These include scented tampons and pads. Wipe from front to back after using the toilet. Doing so avoids spreading fecal bacteria to your vagina.  Other things that may help prevent vaginosis include:  Don't douche. Your vagina doesn't require cleansing other than normal bathing. Repetitive douching disrupts the normal organisms that reside in the vagina and can actually increase your risk of vaginal infection. Douching won't clear up a vaginal infection. Use a latex condom. Both female and female latex condoms may help you avoid infections spread by sexual contact. Wear cotton underwear. Also wear pantyhose with a cotton crotch. If you feel comfortable without it, skip wearing underwear to bed. Yeast thrives in moist environments Your symptoms should improve in the next day or two.  GET HELP RIGHT AWAY IF:  You have pain in your lower abdomen ( pelvic area or over your ovaries) You develop nausea or vomiting You develop a fever Your discharge changes or worsens You have persistent pain with intercourse You develop shortness of breath, a rapid pulse, or you faint.  These symptoms could be signs of problems or infections that need to be evaluated by a medical provider now.  MAKE SURE YOU   Understand these instructions. Will watch your condition. Will get help right   away if you are not doing well or get worse.  Thank you for choosing an e-visit.  Your e-visit answers were reviewed by a board certified advanced clinical practitioner to complete your personal care plan. Depending  upon the condition, your plan could have included both over the counter or prescription medications.  Please review your pharmacy choice. Make sure the pharmacy is open so you can pick up prescription now. If there is a problem, you may contact your provider through MyChart messaging and have the prescription routed to another pharmacy.  Your safety is important to us. If you have drug allergies check your prescription carefully.   For the next 24 hours you can use MyChart to ask questions about today's visit, request a non-urgent call back, or ask for a work or school excuse. You will get an email in the next two days asking about your experience. I hope that your e-visit has been valuable and will speed your recovery.  

## 2024-02-08 NOTE — ED Notes (Signed)
 Phlebotomy at bedside.

## 2024-02-08 NOTE — Progress Notes (Signed)
 I have spent 5 minutes in review of e-visit questionnaire, review and updating patient chart, medical decision making and response to patient.   Piedad Climes, PA-C

## 2024-02-08 NOTE — Telephone Encounter (Signed)
 Pt also LVM in triage line re: this subject but did not indicate which medication that she is referring to.

## 2024-02-08 NOTE — ED Notes (Signed)
 Patient discharged by this RN. Patient verbalizes understanding of instructions with no additional questions.

## 2024-02-08 NOTE — ED Provider Notes (Signed)
 [place] [facility] AT [facility] Provider Note   CSN: [id number] Arrival date & time: 02/08/24  0827     History  Chief Complaint  Patient presents with   Chest Tightness   Back Pain   Ears Ringing   SOB    Kayla [name] is a 33 y.o. female with PMHx GERD, lupus, OA, depression, HTN, ILD, pulmonary HTN, sjrogen's syndrome who presents to ED concerned for multiple complaints. Patient with intermittent episodes of chest tightness, SOB, left arm pain that has been occurring for weeks and is becoming more frequent. Patient stating that she is waking up at night with these symptoms which resolve after opening the bedroom door and breathing in fresh air. Patient states that she may be having panic attacks, but became more concerned because the left sided chest pain/arm pain has been becoming more frequent.   Patient also stating that she has been having low back pain radiating down posterior BL legs. No recent dose of OTC pain medication. Denies BL LE numbness/paresthesias, urinary retention, fecal incontinence. Denies recent falls/trauma.   Patient also stating that she is being evaluated by cardiology for her symptoms and has an appointment for cardiac imaging later today.   Denies fever, cough, nausea, vomiting, diarrhea, dysuria, hematuria, hematochezia.    Back Pain      Home Medications Prior to Admission medications   Medication Sig Start Date End Date Taking? Authorizing Provider  acetaminophen (TYLENOL) 500 MG tablet Take 1,000 mg by mouth every 6 (six) hours as needed.    [provider]  albuterol (VENTOLIN HFA) 108 (90 Base) MCG/ACT inhaler Inhale 2 puffs into the lungs every 6 (six) hours as needed. 09/26/23   [provider]  ASPIRIN LOW DOSE 81 MG tablet Take 81 mg by mouth daily. 09/26/23   [provider]  Boric Acid CRYS Place 500 mg vaginally at bedtime. Use vaginally every night for 21 days then 1 x per week for  the remainder of the pills. Patient not taking: Reported on 02/06/2024 09/28/23   Carlynn Herald, CNM  clindamycin (CLEOCIN) 2 % vaginal cream Place 1 Applicatorful vaginally at bedtime. Insert 1 Applicatorful of medication into the vagina once nightly x 7 nights 02/08/24   Waldon Merl, PA-C  cyclobenzaprine (FLEXERIL) 10 MG tablet Take 1 tablet (10 mg total) by mouth 2 (two) times daily as needed for muscle spasms. 05/03/23   Venora Maples, MD  esomeprazole (NEXIUM) 40 MG capsule Take 40 mg by mouth daily at 12 noon. 09/04/23 09/03/24  [provider]  hydroxychloroquine (PLAQUENIL) 200 MG tablet Take 200 mg by mouth 2 (two) times daily.    [provider]  ipratropium (ATROVENT HFA) 17 MCG/ACT inhaler Inhale 2 puffs into the lungs every 6 (six) hours as needed for wheezing. 05/04/23   Venora Maples, MD  lisinopril (ZESTRIL) 5 MG tablet Take 5 mg by mouth daily.    [provider]  metoprolol tartrate (LOPRESSOR) 100 MG tablet Take 2 hours prior to CT 01/25/24   Tobb, Kardie, DO  ondansetron (ZOFRAN-ODT) 4 MG disintegrating tablet Take 1 tablet (4 mg total) by mouth 2 (two) times daily as needed for nausea. 01/16/24   [name], NP  oxyCODONE-acetaminophen (PERCOCET) 10-325 MG tablet Take 1 tablet by mouth 3 (three) times daily as needed. 08/29/23   [provider]  pantoprazole (PROTONIX) 40 MG tablet Take 1 tablet (40 mg total) by mouth 2 (two) times daily before  a meal. 04/11/23   Venora Maples, MD  predniSONE (DELTASONE) 5 MG tablet Take 5 mg by mouth daily with breakfast.    [provider]  Prenatal 28-0.8 MG TABS Take 1 tablet by mouth daily. 02/23/23   Adam Phenix, MD  Vitamin D, Ergocalciferol, (DRISDOL) 1.25 MG (50000 UNIT) CAPS capsule Take 50,000 Units by mouth once a week. 11/30/23   [provider]  amLODipine (NORVASC) 5 MG tablet Take 1 tablet (5 mg total) by mouth daily. 09/22/17 11/09/20  Duayne Cal,  NP      Allergies    Hydrocodone and Zithromax [azithromycin]    Review of Systems   Review of Systems  Musculoskeletal:  Positive for back pain.    Physical Exam Updated Vital Signs BP (!) 130/96 (BP Location: Right Arm)   Pulse 66   Temp 99.1 F (37.3 C) (Oral)   Resp 18   Ht 5\' 3"  (1.6 m)   Wt 77.1 kg   LMP 01/26/2024 (Approximate)   SpO2 96%   BMI 30.11 kg/m  Physical Exam Vitals and nursing note reviewed.  Constitutional:      General: She is not in acute distress.    Appearance: She is not ill-appearing, toxic-appearing or diaphoretic.  HENT:     Head: Normocephalic and atraumatic.     Mouth/Throat:     Mouth: Mucous membranes are moist.  Eyes:     General: No scleral icterus.       Right eye: No discharge.        Left eye: No discharge.     Conjunctiva/sclera: Conjunctivae normal.  Cardiovascular:     Rate and Rhythm: Normal rate and regular rhythm.     Pulses: Normal pulses.     Heart sounds: Normal heart sounds. No murmur heard.    Comments: +2 radial pulse BL Pulmonary:     Effort: Pulmonary effort is normal. No respiratory distress.     Breath sounds: Normal breath sounds. No wheezing, rhonchi or rales.  Abdominal:     General: Abdomen is flat.  Musculoskeletal:     Right lower leg: No edema.     Left lower leg: No edema.     Comments: Tenderness to palpation of lumbar paraspinal muscles.  Skin:    General: Skin is warm and dry.     Findings: No rash.  Neurological:     General: No focal deficit present.     Mental Status: She is alert and oriented to person, place, and time. Mental status is at baseline.  Psychiatric:        Mood and Affect: Mood normal.        Behavior: Behavior normal.     ED Results / Procedures / Treatments   Labs (all labs ordered are listed, but only abnormal results are displayed) Labs Reviewed  BASIC METABOLIC PANEL - Abnormal; Notable for the following components:      Result Value   Potassium 3.3 (*)     Glucose, Bld 107 (*)    BUN 5 (*)    All other components within normal limits  CBC - Abnormal; Notable for the following components:   RBC 5.26 (*)    All other components within normal limits  RESP PANEL BY RT-PCR (RSV, FLU A&B, COVID)  RVPGX2  HCG, SERUM, QUALITATIVE  D-DIMER, QUANTITATIVE  TROPONIN I (HIGH SENSITIVITY)  TROPONIN I (HIGH SENSITIVITY)    EKG None  Radiology DG Chest 2 View Result Date: 02/08/2024 CLINICAL  DATA:  Chest pain EXAM: CHEST - 2 VIEW COMPARISON:  Chest radiograph dated 01/09/2024 FINDINGS: Normal lung volumes. Unchanged bilateral upper lung linear scarring. No focal consolidations. No pleural effusion or pneumothorax. The heart size and mediastinal contours are within normal limits. No acute osseous abnormality. IMPRESSION: No active cardiopulmonary disease. Electronically Signed   By: Agustin Cree M.D.   On: 02/08/2024 09:04    Procedures Procedures    Medications Ordered in ED Medications  potassium chloride SA (KLOR-CON M) CR tablet 40 mEq (40 mEq Oral Given 02/08/24 0958)  ketorolac (TORADOL) 15 MG/ML injection 15 mg (15 mg Intramuscular Given 02/08/24 1134)    ED Course/ Medical Decision Making/ A&P                                 Medical Decision Making Amount and/or Complexity of Data Reviewed Labs: ordered. Radiology: ordered.   This patient presents to the ED for concern of chest pain, this involves an extensive number of treatment options, and is a complaint that carries with it a high risk of complications and morbidity.  The differential diagnosis includes acute coronary syndrome, congestive heart failure, pericarditis, pneumonia, pulmonary embolism, tension pneumothorax, esophageal rupture, aortic dissection, cardiac tamponade, musculoskeletal   Co morbidities that complicate the patient evaluation  GERD, lupus, OA, depression, HTN, ILD, pulmonary HTN, sjrogen's syndrome    Additional history obtained:  3/6 Cardiology note:  "Recent symptoms of chest pain is concerning, this patient does have intermediate risk for coronary artery disease and at this time I would like to pursue an ischemic evaluation in this patient.  Shared decision a coronary CTA at this time is appropriate."   Problem List / ED Course / Critical interventions / Medication management  Patient presented for intermittent episodes of chest tightness/pain, left arm pain, and SOB. Symptoms resolve with breathing in fresh air. Patient also with lower back pain and BL sciatica - denies any cauda equina symptoms today. Patient also denying any recent infectious symptoms. Physical exam unremarkable. Patient afebrile with stable vitals. Patient with close follow up with outpatient cardiology and has apt for cardiac imaging later today. I Ordered, and personally interpreted labs.  CBC without leukocytosis or anemia.  Initial and repeat troponins within normal limits.  BMP with mild hypokalemia 3.3-patient tolerated oral potassium supplement well.  hCG negative.  D-dimer negative. The patient was maintained on a cardiac monitor.  I personally viewed and interpreted the EKG/cardiac monitored which showed an underlying rhythm of: Sinus rhythm. I ordered imaging studies including chest xray to assess for process contributing to patient's symptoms. I independently visualized and interpreted imaging which showed no acute cardiopulmonary disease. I agree with the radiologist interpretation. It does not appear that patient meets criteria for emergent hospital admission today. Shared results with patient.  Answered all questions.  Provided patient with Toradol for her lower back pain. Educated patient on alternating Advil and Tylenol for back pain. Upon discharge, patient stating that her sister was diagnosed with flu -so patient requesting respiratory panel while she is here even though she does not have cough or fever.  Educated patient that she can follow-up with this result  on her MyChart. Also recommended following up with PCP/ cardiologist. Patient verbalized understanding of plan.  I have reviewed the patients home medicines and have made adjustments as needed Patient was given return precautions. Patient stable for discharge at this time.  Patient verbalized understanding of  plan.  Ddx:  These are considered less likely due to history of present illness and physical exam findings.  -Acute coronary syndrome: EKG and troponins within normal limits  -Pneumonia: lungs are clear to auscultation bilaterally -Pulmonary embolism: dimer negative -Pneumothorax: lungs are clear to auscultation bilaterally -Aortic dissection: vital signs are stable, no variation in pulse pressure; intermittent and chronicity of symptoms would be atypical -Cardiac tamponade: absence of hypotension, JVD, and muffled heart sounds   Social Determinants of Health:  none         Final Clinical Impression(s) / ED Diagnoses Final diagnoses:  Atypical chest pain  Acute bilateral low back pain with bilateral sciatica    Rx / DC Orders ED Discharge Orders     None         Dorthy Cooler, [place] 02/08/24 1306    Gerhard Munch, MD 02/12/24 [phone]

## 2024-02-08 NOTE — Discharge Instructions (Addendum)
 It was a pleasure caring for you today.  Please follow-up with your outpatient cardiologist and primary care provider.  Seek emergency care if experiencing any new or worsening symptoms.  Alternating between 650 mg Tylenol and 400 mg Advil: The best way to alternate taking Acetaminophen (example Tylenol) and Ibuprofen (example Advil/Motrin) is to take them 3 hours apart. For example, if you take ibuprofen at 6 am you can then take Tylenol at 9 am. You can continue this regimen throughout the day, making sure you do not exceed the recommended maximum dose for each drug.

## 2024-02-12 LAB — MYCOPLASMA / UREAPLASMA CULTURE

## 2024-02-13 ENCOUNTER — Ambulatory Visit (HOSPITAL_COMMUNITY)
Admission: EM | Admit: 2024-02-13 | Discharge: 2024-02-13 | Disposition: A | Discharge status: Home / Self Care | Attending: Emergency Medicine | Admitting: Emergency Medicine

## 2024-02-13 ENCOUNTER — Encounter: Payer: Self-pay | Admitting: Nurse Practitioner

## 2024-02-13 ENCOUNTER — Encounter (HOSPITAL_COMMUNITY): Payer: Self-pay | Admitting: Emergency Medicine

## 2024-02-13 DIAGNOSIS — J029 Acute pharyngitis, unspecified: Secondary | ICD-10-CM | POA: Diagnosis not present

## 2024-02-13 LAB — POC COVID19/FLU A&B COMBO
Covid Antigen, POC: NEGATIVE
Influenza A Antigen, POC: NEGATIVE
Influenza B Antigen, POC: NEGATIVE

## 2024-02-13 MED ORDER — LIDOCAINE VISCOUS HCL 2 % MT SOLN: 15.0000 mL | OROMUCOSAL | 0 refills | Status: DC | PRN

## 2024-02-13 NOTE — ED Triage Notes (Signed)
 Pt reports sore throat and when lays down at night has hard time breathing. Tried taking allergy medication and acid reflux medications. Reports yesterday dizzy little bit and BP was high so took HTN meds and BP came down. Reports HR been running 100-101.

## 2024-02-13 NOTE — ED Provider Notes (Signed)
 [facility]    CSN: [id number] Arrival date & time: 02/13/24  2952      History   Chief Complaint Chief Complaint  Patient presents with   Sore Throat    HPI Kayla [name] is a 33 y.o. female.   Patient presents with sore throat, headache, and bodyaches that began on the night of 3/23.  Patient endorses some mild shortness of breath with exertion as well.  Patient states that when she lies down her throat feels more sore and swollen.  Patient states that yesterday she noticed her heart rate was 100 and blood pressure were elevated.  Reports also feeling mild dizziness at that time.  States that she took her blood pressure medication with relief.  Denies chest pain, abdominal pain, nausea, vomiting, diarrhea, known fever, cough, congestion, and dizziness at this time.     Sore Throat    Past Medical History:  Diagnosis Date   Acute respiratory failure with hypoxemia (HCC) 09/19/2017   Acute respiratory failure with hypoxia (HCC)    Arthritis    "hands and legs" (01/08/2015)   CAP (community acquired pneumonia) 01/07/2015   Chronic Respiratory failure with oxygen requirement affecting pregnancy, antepartum 05/16/2016   Resolved pulmonary HTN on 2018 echo (in our epic).   Pt off O2 in 2018.   Daily headache    "sometimes" (01/08/2015)   Depression    2017   Gastroesophageal reflux disease without esophagitis 07/13/2020   GERD (gastroesophageal reflux disease)    Hypertension    No longer takes meds   ILD (interstitial lung disease) (HCC)    Insomnia 08/21/2015   Loud P2 (pulmonary S2, second heart sound) 08/21/2015   Lung disease    Lupus    Lupus (systemic lupus erythematosus) (HCC) 08/21/2015   Multifocal pneumonia    Pulmonary hypertension (HCC)    Seasonal allergies 03/02/2023   Sjogren's syndrome (HCC)    SS-A antibody positive 06/02/2016   SS-B antibody positive 06/02/2016   STD (sexually transmitted disease)    Chlamydia   Vaginal Pap  smear, abnormal    repeats have been ok   Viral illness 03/02/2023    Patient Active Problem List   Diagnosis Date Noted   Severe pre-eclampsia 08/24/2023   SVD (spontaneous vaginal delivery) 08/22/2023   Long term (current) use of systemic steroids 07/26/2023   Thrombocytopenia affecting pregnancy (HCC) 06/23/2023   Obesity affecting pregnancy 04/14/2023   Alpha thalassemia silent carrier 03/11/2023   Supervision of high risk pregnancy, antepartum, RED CHART patient 02/16/2023   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)    History of IUFD 02/14/2017   Chronic hypertension 03/14/2016   Systemic lupus erythematosus (SLE) affecting pregnancy, antepartum (HCC) 07/06/2015    Past Surgical History:  Procedure Laterality Date   CARDIAC CATHETERIZATION N/A 09/09/2015   Procedure: Right Heart Cath;  Surgeon: Laurey Morale, MD;  Location: [facility] INVASIVE CV LAB;  Service: Cardiovascular;  Laterality: N/A;   DILATION AND EVACUATION N/A 07/13/2015   Procedure: DILATATION AND EVACUATION;  Surgeon: Levie Heritage, DO;  Location: [facility] ORS;  Service: Gynecology;  Laterality: N/A;   FINGER SURGERY Right 03/2014   "laceration, nerve/artery injury" 2nd digit   VIDEO BRONCHOSCOPY Bilateral 01/12/2015   Procedure: VIDEO BRONCHOSCOPY WITH FLUORO;  Surgeon: Leslye Peer, MD;  Location: [facility] ENDOSCOPY;  Service: Cardiopulmonary;  Laterality: Bilateral;    OB History     Gravida  5   Para  3   Term  2  Preterm  1   AB  2   Living  2      SAB  2   IAB  0   Ectopic  0   Multiple  0   Live Births  2            Home Medications    Prior to Admission medications   Medication Sig Start Date End Date Taking? Authorizing Provider  lidocaine (XYLOCAINE) 2 % solution Use as directed 15 mLs in the mouth or throat as needed for mouth pain. 02/13/24  Yes Susann Givens, Melrose Kearse A, NP  acetaminophen (TYLENOL) 500 MG tablet Take 1,000 mg by mouth every 6 (six) hours as needed.     [provider]  albuterol (VENTOLIN HFA) 108 (90 Base) MCG/ACT inhaler Inhale 2 puffs into the lungs every 6 (six) hours as needed. 09/26/23   [provider]  ASPIRIN LOW DOSE 81 MG tablet Take 81 mg by mouth daily. 09/26/23   [provider]  Boric Acid CRYS Place 500 mg vaginally at bedtime. Use vaginally every night for 21 days then 1 x per week for the remainder of the pills. Patient not taking: Reported on 02/06/2024 09/28/23   Carlynn Herald, CNM  clindamycin (CLEOCIN) 2 % vaginal cream Place 1 Applicatorful vaginally at bedtime. Insert 1 Applicatorful of medication into the vagina once nightly x 7 nights 02/08/24   Waldon Merl, PA-C  esomeprazole (NEXIUM) 40 MG capsule Take 40 mg by mouth daily at 12 noon. 09/04/23 09/03/24  [provider]  hydroxychloroquine (PLAQUENIL) 200 MG tablet Take 200 mg by mouth 2 (two) times daily.    [provider]  ipratropium (ATROVENT HFA) 17 MCG/ACT inhaler Inhale 2 puffs into the lungs every 6 (six) hours as needed for wheezing. 05/04/23   Venora Maples, MD  lisinopril (ZESTRIL) 5 MG tablet Take 5 mg by mouth daily.    [provider]  ondansetron (ZOFRAN-ODT) 4 MG disintegrating tablet Take 1 tablet (4 mg total) by mouth 2 (two) times daily as needed for nausea. 01/16/24   [name], NP  oxyCODONE-acetaminophen (PERCOCET) 10-325 MG tablet Take 1 tablet by mouth 3 (three) times daily as needed. 08/29/23   [provider]  pantoprazole (PROTONIX) 40 MG tablet Take 1 tablet (40 mg total) by mouth 2 (two) times daily before a meal. 04/11/23   Venora Maples, MD  predniSONE (DELTASONE) 5 MG tablet Take 5 mg by mouth daily with breakfast.    [provider]  Prenatal 28-0.8 MG TABS Take 1 tablet by mouth daily. 02/23/23   Adam Phenix, MD  Vitamin D, Ergocalciferol, (DRISDOL) 1.25 MG (50000 UNIT) CAPS capsule Take 50,000 Units by mouth once a week. 11/30/23   [provider]  amLODipine (NORVASC) 5 MG tablet Take 1 tablet (5 mg total) by mouth daily. 09/22/17 11/09/20  Duayne Cal, NP    Family History Family History  Problem Relation Age of Onset   Hypertension Mother    Deep vein thrombosis Mother    Arthritis Father    Cancer Brother        Found in jaw area   Diabetes Maternal Aunt    Stroke Maternal Grandmother    Hypertension Maternal Grandmother    Diabetes Maternal Grandmother    Hypertension Maternal Grandfather     Social History Social History   Tobacco Use   Smoking status: Former    Current packs/day: 0.00  Average packs/day: 0.1 packs/day for 5.0 years (0.5 ttl pk-yrs)    Types: Cigarettes    Start date: 11/20/2009    Quit date: 11/20/2014    Years since quitting: 9.2   Smokeless tobacco: Never  Vaping Use   Vaping status: Never Used  Substance Use Topics   Alcohol use: Not Currently    Comment: Occas   Drug use: No     Allergies   Hydrocodone and Zithromax [azithromycin]   Review of Systems Review of Systems  Per HPI  Physical Exam Triage Vital Signs ED Triage Vitals  Encounter Vitals Group     BP 02/13/24 1008 124/86     Systolic BP Percentile --      Diastolic BP Percentile --      Pulse Rate 02/13/24 1008 82     Resp 02/13/24 1008 16     Temp 02/13/24 1008 98.4 F (36.9 C)     Temp Source 02/13/24 1008 Oral     SpO2 02/13/24 1008 98 %     Weight --      Height --      Head Circumference --      Peak Flow --      Pain Score 02/13/24 1006 6     Pain Loc --      Pain Education --      Exclude from Growth Chart --    No data found.  Updated Vital Signs BP 124/86 (BP Location: Left Arm)   Pulse 82   Temp 98.4 F (36.9 C) (Oral)   Resp 16   LMP 01/26/2024 (Approximate)   SpO2 98%   Visual Acuity Right Eye Distance:   Left Eye Distance:   Bilateral Distance:    Right Eye Near:   Left Eye Near:    Bilateral Near:     Physical Exam Vitals and nursing note reviewed.   Constitutional:      General: She is awake. She is not in acute distress.    Appearance: Normal appearance. She is well-developed and well-groomed. She is not ill-appearing.  HENT:     Right Ear: Tympanic membrane, ear canal and external ear normal.     Left Ear: Tympanic membrane, ear canal and external ear normal.     Nose: Nose normal.     Mouth/Throat:     Mouth: Mucous membranes are moist.     Pharynx: Posterior oropharyngeal erythema present. No oropharyngeal exudate.  Cardiovascular:     Rate and Rhythm: Normal rate and regular rhythm.  Pulmonary:     Effort: Pulmonary effort is normal.     Breath sounds: Normal breath sounds.  Skin:    General: Skin is warm and dry.  Neurological:     Mental Status: She is alert.  Psychiatric:        Behavior: Behavior is cooperative.      UC Treatments / Results  Labs (all labs ordered are listed, but only abnormal results are displayed) Labs Reviewed  POC COVID19/FLU A&B COMBO    EKG   Radiology No results found.  Procedures Procedures (including critical care time)  Medications Ordered in UC Medications - No data to display  Initial Impression / Assessment and Plan / UC Course  I have reviewed the triage vital signs and the nursing notes.  Pertinent labs & imaging results that were available during my care of the patient were reviewed by me and considered in my medical decision making (see chart for details).  Upon assessment erythema noted to pharynx without swelling or exudate.  Lungs clear bilateral auscultation.  Patient is able to speak in full sentences without difficulty.  Blood pressure and heart rate normal in clinic today.  No other significant findings upon exam.  Flu and COVID testing negative.  Discussed over-the-counter medication for viral illness related symptoms.  Discussed return precautions. Final Clinical Impressions(s) / UC Diagnoses   Final diagnoses:  Viral pharyngitis  Sore throat      Discharge Instructions      You tested negative for flu and COVID today.  I believe your symptoms are likely related to a viral illness.   I have prescribed lidocaine that you can gargle and spit, do not swallow.  This will help to numb your throat and help with discomfort.    Otherwise you can take 500 to 1000 mg of Tylenol every 4-6 hours as needed for sore throat and headache. Do not exceed the maximum daily dose of Tylenol which is 4000 mg. You can alternate between Tylenol and Ibuprofen as needed for pain and fever.   If you develop cough and congestion I recommend Mucinex as needed.  Stay hydrated and get plenty of rest. Return here if symptoms persist or worsen.      ED Prescriptions     Medication Sig Dispense Auth. Provider   lidocaine (XYLOCAINE) 2 % solution Use as directed 15 mLs in the mouth or throat as needed for mouth pain. 100 mL Wynonia Lawman A, NP      PDMP not reviewed this encounter.   Wynonia Lawman A, NP 02/13/24 1056

## 2024-02-13 NOTE — Discharge Instructions (Addendum)
 You tested negative for flu and COVID today.  I believe your symptoms are likely related to a viral illness.   I have prescribed lidocaine that you can gargle and spit, do not swallow.  This will help to numb your throat and help with discomfort.    Otherwise you can take 500 to 1000 mg of Tylenol every 4-6 hours as needed for sore throat and headache. Do not exceed the maximum daily dose of Tylenol which is 4000 mg. You can alternate between Tylenol and Ibuprofen as needed for pain and fever.   If you develop cough and congestion I recommend Mucinex as needed.  Stay hydrated and get plenty of rest. Return here if symptoms persist or worsen.

## 2024-02-21 ENCOUNTER — Telehealth: Admitting: Urgent Care

## 2024-02-21 DIAGNOSIS — J069 Acute upper respiratory infection, unspecified: Secondary | ICD-10-CM | POA: Diagnosis not present

## 2024-02-21 MED ORDER — MONTELUKAST SODIUM 10 MG PO TABS: 10.0000 mg | ORAL_TABLET | Freq: Every day | ORAL | 3 refills | Status: DC

## 2024-02-21 MED ORDER — DOXYCYCLINE HYCLATE 100 MG PO CAPS: 100.0000 mg | ORAL_CAPSULE | Freq: Two times a day (BID) | ORAL | 0 refills | Status: AC

## 2024-02-21 MED ORDER — BENZONATATE 100 MG PO CAPS: 100.0000 mg | ORAL_CAPSULE | Freq: Three times a day (TID) | ORAL | 0 refills | Status: DC | PRN

## 2024-02-21 NOTE — Patient Instructions (Signed)
 Kayla [name], thank you for joining Kayla Bees, PA for today's virtual visit.  While this provider is not your primary care provider (PCP), if your PCP is located in our provider database this encounter information will be shared with them immediately following your visit.   A [facility] MyChart account gives you access to today's visit and all your visits, tests, and labs performed at [facility] " click here if you don't have a [facility] MyChart account or go to [email or web address]  Consent: (Patient) Kayla [name] provided verbal consent for this virtual visit at the beginning of the encounter.  Current Medications:  Current Outpatient Medications:    benzonatate (TESSALON PERLES) 100 MG capsule, Take 1 capsule (100 mg total) by mouth 3 (three) times daily as needed for cough., Disp: 20 capsule, Rfl: 0   doxycycline (VIBRAMYCIN) 100 MG capsule, Take 1 capsule (100 mg total) by mouth 2 (two) times daily for 7 days., Disp: 14 capsule, Rfl: 0   montelukast (SINGULAIR) 10 MG tablet, Take 1 tablet (10 mg total) by mouth at bedtime., Disp: 30 tablet, Rfl: 3   acetaminophen (TYLENOL) 500 MG tablet, Take 1,000 mg by mouth every 6 (six) hours as needed., Disp: , Rfl:    albuterol (VENTOLIN HFA) 108 (90 Base) MCG/ACT inhaler, Inhale 2 puffs into the lungs every 6 (six) hours as needed., Disp: , Rfl:    ASPIRIN LOW DOSE 81 MG tablet, Take 81 mg by mouth daily., Disp: , Rfl:    Boric Acid CRYS, Place 500 mg vaginally at bedtime. Use vaginally every night for 21 days then 1 x per week for the remainder of the pills. (Patient not taking: Reported on 02/06/2024), Disp: 30 g, Rfl: 0   clindamycin (CLEOCIN) 2 % vaginal cream, Place 1 Applicatorful vaginally at bedtime. Insert 1 Applicatorful of medication into the vagina once nightly x 7 nights, Disp: 35 g, Rfl: 0   esomeprazole (NEXIUM) 40 MG capsule, Take 40 mg by mouth daily at 12 noon., Disp: , Rfl:    hydroxychloroquine  (PLAQUENIL) 200 MG tablet, Take 200 mg by mouth 2 (two) times daily., Disp: , Rfl:    ipratropium (ATROVENT HFA) 17 MCG/ACT inhaler, Inhale 2 puffs into the lungs every 6 (six) hours as needed for wheezing., Disp: 1 each, Rfl: 12   lidocaine (XYLOCAINE) 2 % solution, Use as directed 15 mLs in the mouth or throat as needed for mouth pain., Disp: 100 mL, Rfl: 0   lisinopril (ZESTRIL) 5 MG tablet, Take 5 mg by mouth daily., Disp: , Rfl:    ondansetron (ZOFRAN-ODT) 4 MG disintegrating tablet, Take 1 tablet (4 mg total) by mouth 2 (two) times daily as needed for nausea., Disp: 14 tablet, Rfl: 0   oxyCODONE-acetaminophen (PERCOCET) 10-325 MG tablet, Take 1 tablet by mouth 3 (three) times daily as needed., Disp: , Rfl:    pantoprazole (PROTONIX) 40 MG tablet, Take 1 tablet (40 mg total) by mouth 2 (two) times daily before a meal., Disp: 60 tablet, Rfl: 5   predniSONE (DELTASONE) 5 MG tablet, Take 5 mg by mouth daily with breakfast., Disp: , Rfl:    Prenatal 28-0.8 MG TABS, Take 1 tablet by mouth daily., Disp: 30 tablet, Rfl: 12   Vitamin D, Ergocalciferol, (DRISDOL) 1.25 MG (50000 UNIT) CAPS capsule, Take 50,000 Units by mouth once a week., Disp: , Rfl:    Medications ordered in this encounter:  Meds ordered this encounter  Medications   doxycycline (VIBRAMYCIN) 100 MG capsule  Sig: Take 1 capsule (100 mg total) by mouth 2 (two) times daily for 7 days.    Dispense:  14 capsule    Refill:  0   montelukast (SINGULAIR) 10 MG tablet    Sig: Take 1 tablet (10 mg total) by mouth at bedtime.    Dispense:  30 tablet    Refill:  3   benzonatate (TESSALON PERLES) 100 MG capsule    Sig: Take 1 capsule (100 mg total) by mouth 3 (three) times daily as needed for cough.    Dispense:  20 capsule    Refill:  0     *If you need refills on other medications prior to your next appointment, please contact your pharmacy*  Follow-Up: Call back or seek an in-person evaluation if the symptoms worsen or if the  condition fails to improve as anticipated.  [facility] [phone]  Other Instructions Start taking doxycycline twice daily until gone. Avoid excessive sun exposure on the antibiotic. Start Singulair nightly until symptoms resolved.  Use tessalon three times daily as needed for cough.   If you have been instructed to have an in-person evaluation today at a local Urgent Care facility, please use the link below. It will take you to a list of all of our available [facility], including address, phone number and hours of operation. Please do not delay care.  [facility]  If you or a family member do not have a primary care provider, use the link below to schedule a visit and establish care. When you choose a [facility] primary care physician or advanced practice provider, you gain a long-term partner in health. Find a Primary Care Provider  Learn more about [facility]'s in-office and virtual care options: [facility] - Get Care Now

## 2024-02-21 NOTE — Progress Notes (Signed)
 Virtual Visit Consent   Kayla [name], you are scheduled for a virtual visit with a [facility] provider today. Just as with appointments in the office, your consent must be obtained to participate. Your consent will be active for this visit and any virtual visit you may have with one of our providers in the next 365 days. If you have a MyChart account, a copy of this consent can be sent to you electronically.  As this is a virtual visit, video technology does not allow for your provider to perform a traditional examination. This may limit your provider's ability to fully assess your condition. If your provider identifies any concerns that need to be evaluated in person or the need to arrange testing (such as labs, EKG, etc.), we will make arrangements to do so. Although advances in technology are sophisticated, we cannot ensure that it will always work on either your end or our end. If the connection with a video visit is poor, the visit may have to be switched to a telephone visit. With either a video or telephone visit, we are not always able to ensure that we have a secure connection.  By engaging in this virtual visit, you consent to the provision of healthcare and authorize for your insurance to be billed (if applicable) for the services provided during this visit. Depending on your insurance coverage, you may receive a charge related to this service.  I need to obtain your verbal consent now. Are you willing to proceed with your visit today? Kayla [name] has provided verbal consent on 02/21/2024 for a virtual visit (video or telephone). [name], [place]  Date: 02/21/2024 6:38 PM   Virtual Visit via Video Note   I, [name], connected with  Kayla [name]  ([id number], [birth year]) on 02/21/24 at  6:30 PM EDT by a video-enabled telemedicine application and verified that I am speaking with the correct person using two identifiers.  Location: Patient: Virtual Visit Location Patient:  Home Provider: Virtual Visit Location Provider: Home Office   I discussed the limitations of evaluation and management by telemedicine and the availability of in person appointments. The patient expressed understanding and agreed to proceed.    History of Present Illness: Kayla [name] is a 33 y.o. who identifies as a female who was assigned female at birth, and is being seen today for continued URI symptoms.  HPI: HPI   33yo female with hx of lupus and ILD presents with continued complaints of cough, sore throat, body aches and chills. She was first seen with similar sx on 02/08/24 in the ER. Labs performed and CXR completed. Pt then seen with similar sx in UC on 02/13/24, was sent Rx for lidocaine throat spray. Pt states the sx never resolved, and in fact got much worse last night. Denies fever or SOB, but states that she is coughing up excessive mucous which is yellow in color. Throat is sore and "burning". Has been needing to use her inhaler more often than normal. Has used OTC mucinex with minimal relief.   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)  History of IUFD 02/14/2017   Chronic hypertension 03/14/2016   Systemic lupus erythematosus (SLE) affecting pregnancy, antepartum (HCC) 07/06/2015    Allergies:  Allergies  Allergen Reactions   Hydrocodone Nausea And Vomiting   Zithromax [Azithromycin] Itching and Cough   Medications:  Current Outpatient Medications:    benzonatate (TESSALON PERLES) 100 MG capsule, Take 1 capsule (100 mg total) by  mouth 3 (three) times daily as needed for cough., Disp: 20 capsule, Rfl: 0   doxycycline (VIBRAMYCIN) 100 MG capsule, Take 1 capsule (100 mg total) by mouth 2 (two) times daily for 7 days., Disp: 14 capsule, Rfl: 0   montelukast (SINGULAIR) 10 MG tablet, Take 1 tablet (10 mg total) by mouth at bedtime., Disp: 30 tablet, Rfl: 3   acetaminophen (TYLENOL) 500 MG tablet, Take 1,000 mg by mouth every 6 (six) hours as needed., Disp: , Rfl:    albuterol (VENTOLIN HFA) 108 (90 Base) MCG/ACT inhaler, Inhale 2 puffs into the lungs every 6 (six) hours as needed., Disp: , Rfl:    ASPIRIN LOW DOSE 81 MG tablet, Take 81 mg by mouth daily., Disp: , Rfl:    Boric Acid CRYS, Place 500 mg vaginally at bedtime. Use vaginally every night for 21 days then 1 x per week for the remainder of the pills. (Patient not taking: Reported on 02/06/2024), Disp: 30 g, Rfl: 0   clindamycin (CLEOCIN) 2 % vaginal cream, Place 1 Applicatorful vaginally at bedtime. Insert 1 Applicatorful of medication into the vagina once nightly x 7 nights, Disp: 35 g, Rfl: 0   esomeprazole (NEXIUM) 40 MG capsule, Take 40 mg by mouth daily at 12 noon., Disp: , Rfl:    hydroxychloroquine (PLAQUENIL) 200 MG tablet, Take 200 mg by mouth 2 (two) times daily., Disp: , Rfl:    ipratropium (ATROVENT HFA) 17 MCG/ACT inhaler, Inhale 2 puffs into the lungs every 6 (six) hours as needed for wheezing., Disp: 1 each, Rfl: 12   lidocaine (XYLOCAINE) 2 % solution, Use as directed 15 mLs in the mouth or throat as needed for mouth pain., Disp: 100 mL, Rfl: 0   lisinopril (ZESTRIL) 5 MG tablet, Take 5 mg by mouth daily., Disp: , Rfl:    ondansetron (ZOFRAN-ODT) 4 MG disintegrating tablet, Take 1 tablet (4 mg total) by mouth 2 (two) times daily as needed for nausea., Disp: 14 tablet, Rfl: 0   oxyCODONE-acetaminophen (PERCOCET) 10-325 MG tablet, Take 1 tablet by mouth 3 (three) times daily as needed., Disp: , Rfl:    pantoprazole (PROTONIX) 40 MG tablet, Take 1 tablet (40 mg  total) by mouth 2 (two) times daily before a meal., Disp: 60 tablet, Rfl: 5   predniSONE (DELTASONE) 5 MG tablet, Take 5 mg by mouth daily with breakfast., Disp: , Rfl:    Prenatal 28-0.8 MG TABS, Take 1 tablet by mouth daily., Disp: 30 tablet, Rfl: 12   Vitamin D, Ergocalciferol, (DRISDOL) 1.25 MG (50000 UNIT) CAPS capsule, Take 50,000 Units by mouth once a week., Disp: , Rfl:   Observations/Objective: Patient is well-developed, well-nourished in no acute distress.  Resting comfortably upright in chair at home.  Head is normocephalic, atraumatic.  No labored breathing. No accessory muscle usage. No coughing during encounter. Speech is clear and coherent with logical content.  Patient is alert and oriented at baseline.  No visible rhinorrhea  Assessment and Plan: 1. Upper respiratory tract infection, unspecified type (Primary) - doxycycline (VIBRAMYCIN) 100 MG capsule; Take 1 capsule (100 mg total) by mouth 2 (two)  times daily for 7 days.  Dispense: 14 capsule; Refill: 0 - montelukast (SINGULAIR) 10 MG tablet; Take 1 tablet (10 mg total) by mouth at bedtime.  Dispense: 30 tablet; Refill: 3 - benzonatate (TESSALON PERLES) 100 MG capsule; Take 1 capsule (100 mg total) by mouth 3 (three) times daily as needed for cough.  Dispense: 20 capsule; Refill: 0  Pt has had persistent symptoms for 2+ weeks, has ILD and is on immunomodulating medications. Suspect at this point it may have turned bacterial and thus abx are warranted. Will also start singulair given hx of allergies in the past.  Follow Up Instructions: I discussed the assessment and treatment plan with the patient. The patient was provided an opportunity to ask questions and all were answered. The patient agreed with the plan and demonstrated an understanding of the instructions.  A copy of instructions were sent to the patient via MyChart unless otherwise noted below.   The patient was advised to call back or seek an in-person evaluation  if the symptoms worsen or if the condition fails to improve as anticipated.    [name], PA

## 2024-02-22 ENCOUNTER — Encounter (HOSPITAL_COMMUNITY): Payer: Self-pay | Admitting: Emergency Medicine

## 2024-02-22 ENCOUNTER — Ambulatory Visit (HOSPITAL_COMMUNITY)
Admission: EM | Admit: 2024-02-22 | Discharge: 2024-02-22 | Disposition: A | Discharge status: Home / Self Care | Attending: Internal Medicine | Admitting: Internal Medicine

## 2024-02-22 ENCOUNTER — Encounter: Payer: Self-pay | Admitting: Cardiology

## 2024-02-22 DIAGNOSIS — J069 Acute upper respiratory infection, unspecified: Secondary | ICD-10-CM | POA: Insufficient documentation

## 2024-02-22 DIAGNOSIS — R051 Acute cough: Secondary | ICD-10-CM | POA: Insufficient documentation

## 2024-02-22 DIAGNOSIS — J101 Influenza due to other identified influenza virus with other respiratory manifestations: Secondary | ICD-10-CM | POA: Insufficient documentation

## 2024-02-22 LAB — POCT RAPID STREP A (OFFICE): Rapid Strep A Screen: NEGATIVE

## 2024-02-22 LAB — POCT INFLUENZA A/B
Influenza A, POC: POSITIVE — AB
Influenza B, POC: NEGATIVE

## 2024-02-22 MED ORDER — PROMETHAZINE-DM 6.25-15 MG/5ML PO SYRP: 5.0000 mL | ORAL_SOLUTION | Freq: Three times a day (TID) | ORAL | 0 refills | Status: DC | PRN

## 2024-02-22 NOTE — Discharge Instructions (Addendum)
 Flu A, flu B, COVID and strep testing done today.  Strep and flu B are negative.  Flu A is positive however given the symptoms have been going on for 5 days and you are now on antibiotics, we will defer treatment with Tamiflu. The COVID test will take 12 to 24 hours for final results and if negative will be available on your MyChart.  As the symptoms have been going on for at least 5 days, treatment will continue with the antibiotics that were prescribed to you yesterday.  The testing is being done due to your employers request.  We will add the following medication to your regimen: Promethazine DM 5 mL every 8 hours as needed for cough.  Use caution as this medication can cause drowsiness.  Continue doxycycline as prescribed. Rest and stay hydrated.  Drink plenty of fluids even if you do not feel like taking in solid foods May use Tylenol for fevers/body aches Avoid returning to work until you are 24 hours without a fever without fever reducing medication Return to urgent care or PCP if symptoms worsen or fail to resolve.

## 2024-02-22 NOTE — ED Provider Notes (Signed)
 [facility]    CSN: [id number] Arrival date & time: 02/22/24  1300      History   Chief Complaint Chief Complaint  Patient presents with   Generalized Body Aches   Sore Throat    HPI Kayla [name] is a 33 y.o. female.   33 year old female who presents urgent care with complaints of bodyaches, sore throat, cough, fatigue and poor appetite.  She reports her symptoms started Saturday or Sunday although she has had a sore throat since 25 March.  She was seen at urgent care on March 25 for the sore throat and given lidocaine.  She was tested for flu and COVID at that time but not strep throat.  She reports she did not have flu or COVID symptoms at that time.  She reports that over the last week she has developed fevers, generalized bodyaches, worsening cough and poor appetite.  She did a virtual visit yesterday with her primary care doctor and given the duration of her symptoms she was started on doxycycline and Tessalon pearls as well as Claritin.  She is here today as she reports that her employer wants her tested again before she returns to work.  She reports her chest is feeling somewhat better since she started the doxycycline.   Sore Throat Pertinent negatives include no chest pain, no abdominal pain and no shortness of breath.    Past Medical History:  Diagnosis Date   Acute respiratory failure with hypoxemia (HCC) 09/19/2017   Acute respiratory failure with hypoxia (HCC)    Arthritis    "hands and legs" (01/08/2015)   CAP (community acquired pneumonia) 01/07/2015   Chronic Respiratory failure with oxygen requirement affecting pregnancy, antepartum 05/16/2016   Resolved pulmonary HTN on 2018 echo (in our epic).   Pt off O2 in 2018.   Daily headache    "sometimes" (01/08/2015)   Depression    2017   Gastroesophageal reflux disease without esophagitis 07/13/2020   GERD (gastroesophageal reflux disease)    Hypertension    No longer takes meds   ILD (interstitial  lung disease) (HCC)    Insomnia 08/21/2015   Loud P2 (pulmonary S2, second heart sound) 08/21/2015   Lung disease    Lupus    Lupus (systemic lupus erythematosus) (HCC) 08/21/2015   Multifocal pneumonia    Pulmonary hypertension (HCC)    Seasonal allergies 03/02/2023   Sjogren's syndrome (HCC)    SS-A antibody positive 06/02/2016   SS-B antibody positive 06/02/2016   STD (sexually transmitted disease)    Chlamydia   Vaginal Pap smear, abnormal    repeats have been ok   Viral illness 03/02/2023    Patient Active Problem List   Diagnosis Date Noted   Severe pre-eclampsia 08/24/2023   SVD (spontaneous vaginal delivery) 08/22/2023   Long term (current) use of systemic steroids 07/26/2023   Thrombocytopenia affecting pregnancy (HCC) 06/23/2023   Obesity affecting pregnancy 04/14/2023   Alpha thalassemia silent carrier 03/11/2023   Supervision of high risk pregnancy, antepartum, RED CHART patient 02/16/2023   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)    History of IUFD 02/14/2017   Chronic hypertension 03/14/2016   Systemic lupus erythematosus (SLE) affecting pregnancy, antepartum (HCC) 07/06/2015    Past Surgical History:  Procedure Laterality Date   CARDIAC CATHETERIZATION N/A 09/09/2015   Procedure: Right Heart Cath;  Surgeon: Laurey Morale, MD;  Location: [facility];  Service: Cardiovascular;  Laterality: N/A;   DILATION AND EVACUATION N/A 07/13/2015  Procedure: DILATATION AND EVACUATION;  Surgeon: Levie Heritage, DO;  Location: [facility];  Service: Gynecology;  Laterality: N/A;   FINGER SURGERY Right 03/2014   "laceration, nerve/artery injury" 2nd digit   VIDEO BRONCHOSCOPY Bilateral 01/12/2015   Procedure: VIDEO BRONCHOSCOPY WITH FLUORO;  Surgeon: Leslye Peer, MD;  Location: [facility] ENDOSCOPY;  Service: Cardiopulmonary;  Laterality: Bilateral;    OB History     Gravida  5   Para  3   Term  2   Preterm  1   AB  2   Living  2      SAB  2    IAB  0   Ectopic  0   Multiple  0   Live Births  2            Home Medications    Prior to Admission medications   Medication Sig Start Date End Date Taking? Authorizing Provider  promethazine-dextromethorphan (PROMETHAZINE-DM) 6.25-15 MG/5ML syrup Take 5 mLs by mouth every 8 (eight) hours as needed for cough. 02/22/24  Yes Oris Staffieri A, PA-C  acetaminophen (TYLENOL) 500 MG tablet Take 1,000 mg by mouth every 6 (six) hours as needed.    [provider]  albuterol (VENTOLIN HFA) 108 (90 Base) MCG/ACT inhaler Inhale 2 puffs into the lungs every 6 (six) hours as needed. 09/26/23   [provider]  ASPIRIN LOW DOSE 81 MG tablet Take 81 mg by mouth daily. 09/26/23   [provider]  benzonatate (TESSALON PERLES) 100 MG capsule Take 1 capsule (100 mg total) by mouth 3 (three) times daily as needed for cough. 02/21/24   Crain, Whitney L, PA  Boric Acid CRYS Place 500 mg vaginally at bedtime. Use vaginally every night for 21 days then 1 x per week for the remainder of the pills. Patient not taking: Reported on 02/06/2024 09/28/23   Carlynn Herald, CNM  clindamycin (CLEOCIN) 2 % vaginal cream Place 1 Applicatorful vaginally at bedtime. Insert 1 Applicatorful of medication into the vagina once nightly x 7 nights 02/08/24   Waldon Merl, PA-C  doxycycline (VIBRAMYCIN) 100 MG capsule Take 1 capsule (100 mg total) by mouth 2 (two) times daily for 7 days. 02/21/24 02/28/24  Crain, Alphonzo Lemmings L, PA  esomeprazole (NEXIUM) 40 MG capsule Take 40 mg by mouth daily at 12 noon. 09/04/23 09/03/24  [provider]  hydroxychloroquine (PLAQUENIL) 200 MG tablet Take 200 mg by mouth 2 (two) times daily.    [provider]  ipratropium (ATROVENT HFA) 17 MCG/ACT inhaler Inhale 2 puffs into the lungs every 6 (six) hours as needed for wheezing. 05/04/23   Venora Maples, MD  lidocaine (XYLOCAINE) 2 % solution Use as directed 15 mLs in the mouth or throat as needed  for mouth pain. 02/13/24   Wynonia Lawman A, NP  lisinopril (ZESTRIL) 5 MG tablet Take 5 mg by mouth daily.    [provider]  montelukast (SINGULAIR) 10 MG tablet Take 1 tablet (10 mg total) by mouth at bedtime. 02/21/24   Crain, Whitney L, PA  ondansetron (ZOFRAN-ODT) 4 MG disintegrating tablet Take 1 tablet (4 mg total) by mouth 2 (two) times daily as needed for nausea. 01/16/24   [name], NP  oxyCODONE-acetaminophen (PERCOCET) 10-325 MG tablet Take 1 tablet by mouth 3 (three) times daily as needed. 08/29/23   [provider]  pantoprazole (PROTONIX) 40 MG tablet Take 1 tablet (40 mg total) by mouth 2 (two) times daily before a  meal. 04/11/23   Venora Maples, MD  predniSONE (DELTASONE) 5 MG tablet Take 5 mg by mouth daily with breakfast.    [provider]  Prenatal 28-0.8 MG TABS Take 1 tablet by mouth daily. 02/23/23   Adam Phenix, MD  Vitamin D, Ergocalciferol, (DRISDOL) 1.25 MG (50000 UNIT) CAPS capsule Take 50,000 Units by mouth once a week. 11/30/23   [provider]  amLODipine (NORVASC) 5 MG tablet Take 1 tablet (5 mg total) by mouth daily. 09/22/17 11/09/20  Duayne Cal, NP    Family History Family History  Problem Relation Age of Onset   Hypertension Mother    Deep vein thrombosis Mother    Arthritis Father    Cancer Brother        Found in jaw area   Diabetes Maternal Aunt    Stroke Maternal Grandmother    Hypertension Maternal Grandmother    Diabetes Maternal Grandmother    Hypertension Maternal Grandfather     Social History Social History   Tobacco Use   Smoking status: Former    Current packs/day: 0.00    Average packs/day: 0.1 packs/day for 5.0 years (0.5 ttl pk-yrs)    Types: Cigarettes    Start date: 11/20/2009    Quit date: 11/20/2014    Years since quitting: 9.2   Smokeless tobacco: Never  Vaping Use   Vaping status: Never Used  Substance Use Topics   Alcohol use: Not Currently    Comment: Occas    Drug use: No     Allergies   Hydrocodone and Zithromax [azithromycin]   Review of Systems Review of Systems  Constitutional:  Positive for appetite change, chills, fatigue and fever.  HENT:  Positive for congestion and sore throat. Negative for ear pain.   Eyes:  Negative for pain and visual disturbance.  Respiratory:  Positive for cough. Negative for shortness of breath.   Cardiovascular:  Negative for chest pain and palpitations.  Gastrointestinal:  Negative for abdominal pain and vomiting.  Genitourinary:  Negative for dysuria and hematuria.  Musculoskeletal:  Negative for arthralgias and back pain.       Generalized body aches  Skin:  Negative for color change and rash.  Neurological:  Negative for seizures and syncope.  All other systems reviewed and are negative.    Physical Exam Triage Vital Signs ED Triage Vitals [02/22/24 1402]  Encounter Vitals Group     BP 117/84     Systolic BP Percentile      Diastolic BP Percentile      Pulse Rate (!) 104     Resp 16     Temp (!) 100.9 F (38.3 C)     Temp Source Oral     SpO2 97 %     Weight      Height      Head Circumference      Peak Flow      Pain Score      Pain Loc      Pain Education      Exclude from Growth Chart    No data found.  Updated Vital Signs BP 117/84 (BP Location: Left Arm)   Pulse (!) 104   Temp (!) 100.9 F (38.3 C) (Oral)   Resp 16   LMP 02/20/2024 (Approximate)   SpO2 97%   Visual Acuity Right Eye Distance:   Left Eye Distance:   Bilateral Distance:    Right Eye Near:   Left Eye Near:  Bilateral Near:     Physical Exam Vitals and nursing note reviewed.  Constitutional:      General: She is not in acute distress.    Appearance: She is well-developed.  HENT:     Head: Normocephalic and atraumatic.     Right Ear: Tympanic membrane normal.     Left Ear: Tympanic membrane normal.     Nose: Congestion present.     Mouth/Throat:     Mouth: Mucous membranes are moist.      Pharynx: Posterior oropharyngeal erythema (Minimal) present.  Eyes:     Conjunctiva/sclera: Conjunctivae normal.  Cardiovascular:     Rate and Rhythm: Normal rate and regular rhythm.     Heart sounds: No murmur heard. Pulmonary:     Effort: Pulmonary effort is normal. No respiratory distress.     Breath sounds: Normal breath sounds. No decreased breath sounds, wheezing or rhonchi.  Abdominal:     Palpations: Abdomen is soft.     Tenderness: There is no abdominal tenderness.  Musculoskeletal:        General: No swelling.     Cervical back: Neck supple.  Skin:    General: Skin is warm and dry.     Capillary Refill: Capillary refill takes less than 2 seconds.  Neurological:     Mental Status: She is alert.  Psychiatric:        Mood and Affect: Mood normal.      UC Treatments / Results  Labs (all labs ordered are listed, but only abnormal results are displayed) Labs Reviewed  SARS CORONAVIRUS 2 (TAT 6-24 HRS)  POCT RAPID STREP A (OFFICE)  POCT INFLUENZA A/B    EKG   Radiology No results found.  Procedures Procedures (including critical care time)  Medications Ordered in UC Medications - No data to display  Initial Impression / Assessment and Plan / UC Course  I have reviewed the triage vital signs and the nursing notes.  Pertinent labs & imaging results that were available during my care of the patient were reviewed by me and considered in my medical decision making (see chart for details).     Acute upper respiratory infection  Acute cough  Influenza A   Flu A, flu B, COVID and strep testing done today.  Strep and flu B are negative.  Flu A is positive however given the symptoms have been going on for 5 days and you are now on antibiotics, we will defer treatment with Tamiflu. The COVID test will take 12 to 24 hours for final results and if negative will be available on your MyChart.  As the symptoms have been going on for at least 5 days, treatment will  continue with the antibiotics that were prescribed to you yesterday.  The testing is being done due to your employers request.  We will add the following medication to your regimen: Promethazine DM 5 mL every 8 hours as needed for cough.  Use caution as this medication can cause drowsiness.  Continue doxycycline as prescribed. Rest and stay hydrated.  Drink plenty of fluids even if you do not feel like taking in solid foods May use Tylenol for fevers/body aches Avoid returning to work until you are 24 hours without a fever without fever reducing medication Return to urgent care or PCP if symptoms worsen or fail to resolve.    Final Clinical Impressions(s) / UC Diagnoses   Final diagnoses:  Acute upper respiratory infection  Acute cough     Discharge  Instructions       Promethazine DM 5 mL every 8 hours as needed for cough.  Use caution as this medication can cause drowsiness.    ED Prescriptions     Medication Sig Dispense Auth. Provider   promethazine-dextromethorphan (PROMETHAZINE-DM) 6.25-15 MG/5ML syrup Take 5 mLs by mouth every 8 (eight) hours as needed for cough. 180 mL Landis Martins, [place]      PDMP not reviewed this encounter.   Landis Martins, [place] 02/22/24 1454

## 2024-02-22 NOTE — ED Triage Notes (Signed)
 Pt reports that since last Sunday had sore throat, body aches, cough. Had an e-visit yesterday and was prescribed doxycycline, montelukast and tessalon.  Was seen here for sore throat 3/25 and was prescribed lidocaine for sore throat.

## 2024-02-23 LAB — SARS CORONAVIRUS 2 (TAT 6-24 HRS): SARS Coronavirus 2: NEGATIVE

## 2024-02-25 ENCOUNTER — Telehealth: Admitting: Physician Assistant

## 2024-02-25 DIAGNOSIS — J111 Influenza due to unidentified influenza virus with other respiratory manifestations: Secondary | ICD-10-CM

## 2024-02-25 DIAGNOSIS — J069 Acute upper respiratory infection, unspecified: Secondary | ICD-10-CM

## 2024-02-25 NOTE — Progress Notes (Signed)
 Virtual Visit Consent   Kayla [name], you are scheduled for a virtual visit with a [facility] provider today. Just as with appointments in the office, your consent must be obtained to participate. Your consent will be active for this visit and any virtual visit you may have with one of our providers in the next 365 days. If you have a MyChart account, a copy of this consent can be sent to you electronically.  As this is a virtual visit, video technology does not allow for your provider to perform a traditional examination. This may limit your provider's ability to fully assess your condition. If your provider identifies any concerns that need to be evaluated in person or the need to arrange testing (such as labs, EKG, etc.), we will make arrangements to do so. Although advances in technology are sophisticated, we cannot ensure that it will always work on either your end or our end. If the connection with a video visit is poor, the visit may have to be switched to a telephone visit. With either a video or telephone visit, we are not always able to ensure that we have a secure connection.  By engaging in this virtual visit, you consent to the provision of healthcare and authorize for your insurance to be billed (if applicable) for the services provided during this visit. Depending on your insurance coverage, you may receive a charge related to this service.  I need to obtain your verbal consent now. Are you willing to proceed with your visit today? Kayla [name] has provided verbal consent on 02/25/2024 for a virtual visit (video or telephone). Kayla Jaffe, PA-C  Date: 02/25/2024 2:50 PM   Virtual Visit via Video Note   I, Kayla [name], connected with  Kayla [name]  ([id number], [birth year]) on 02/25/24 at  2:45 PM EDT by a video-enabled telemedicine application and verified that I am speaking with the correct person using two identifiers.  Location: Patient: Virtual Visit Location Patient:  Home Provider: Virtual Visit Location Provider: Home Office   I discussed the limitations of evaluation and management by telemedicine and the availability of in person appointments. The patient expressed understanding and agreed to proceed.    History of Present Illness: Kayla [name] is a 33 y.o. who identifies as a female who was assigned female at birth, with a recent diagnosis of influenza A, presents with persistent respiratory symptoms despite treatment with doxycycline initiated on April 2nd. She reports that the body aches have resolved, but she continues to experience chest discomfort, back pain, and a persistent cough. The cough is mostly dry, but occasionally productive of mucus. She has been using the prescribed cough medication without relief. She also reports a lack of appetite, but is making an effort to stay hydrated. She has been managing body aches with both Tylenol and ibuprofen.  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)    History of IUFD 02/14/2017   Chronic hypertension 03/14/2016   Systemic lupus erythematosus (SLE) affecting pregnancy, antepartum (HCC) 07/06/2015    Allergies:  Allergies  Allergen Reactions   Hydrocodone Nausea And Vomiting   Zithromax [Azithromycin] Itching and Cough   Medications:  Current Outpatient Medications:    acetaminophen (TYLENOL) 500 MG tablet, Take 1,000 mg by mouth every 6 (six) hours as needed., Disp: , Rfl:    albuterol (VENTOLIN HFA) 108  (90 Base) MCG/ACT inhaler, Inhale 2 puffs into the lungs every 6 (six) hours as needed., Disp: , Rfl:    ASPIRIN LOW DOSE 81 MG tablet, Take 81 mg by mouth daily., Disp: , Rfl:    Boric Acid CRYS, Place 500 mg vaginally at bedtime. Use vaginally every night for 21 days then 1 x per week for the remainder of the pills. (Patient not taking: Reported on 02/06/2024), Disp: 30 g, Rfl: 0   clindamycin (CLEOCIN) 2 % vaginal cream, Place 1 Applicatorful vaginally at bedtime. Insert 1 Applicatorful of medication into the vagina once nightly x 7 nights, Disp: 35 g, Rfl: 0   doxycycline (VIBRAMYCIN) 100 MG capsule, Take 1 capsule (100 mg total) by mouth 2 (two) times daily for 7 days., Disp: 14 capsule, Rfl: 0   esomeprazole (NEXIUM) 40 MG capsule, Take 40 mg by mouth daily at 12 noon., Disp: , Rfl:    hydroxychloroquine (PLAQUENIL) 200 MG tablet, Take 200 mg by mouth 2 (two) times daily., Disp: , Rfl:    ipratropium (ATROVENT HFA) 17 MCG/ACT inhaler, Inhale 2 puffs into the lungs every 6 (six) hours as needed for wheezing., Disp: 1 each, Rfl: 12   lidocaine (XYLOCAINE) 2 % solution, Use as directed 15 mLs in the mouth or throat as needed for mouth pain., Disp: 100 mL, Rfl: 0   lisinopril (ZESTRIL) 5 MG tablet, Take 5 mg by mouth daily., Disp: , Rfl:    montelukast (SINGULAIR) 10 MG tablet, Take 1 tablet (10 mg total) by mouth at bedtime., Disp: 30 tablet, Rfl: 3   ondansetron (ZOFRAN-ODT) 4 MG disintegrating tablet, Take 1 tablet (4 mg total) by mouth 2 (two) times daily as needed for nausea., Disp: 14 tablet, Rfl: 0   oxyCODONE-acetaminophen (PERCOCET) 10-325 MG tablet, Take 1 tablet by mouth 3 (three) times daily as needed., Disp: , Rfl:    pantoprazole (PROTONIX) 40 MG tablet, Take 1 tablet (40 mg total) by mouth 2 (two) times daily before a meal., Disp: 60 tablet, Rfl: 5   predniSONE (DELTASONE) 5 MG tablet, Take 5 mg by mouth daily with breakfast., Disp: , Rfl:    Prenatal 28-0.8 MG TABS, Take 1 tablet by  mouth daily., Disp: 30 tablet, Rfl: 12   promethazine-dextromethorphan (PROMETHAZINE-DM) 6.25-15 MG/5ML syrup, Take 5 mLs by mouth every 8 (eight) hours as needed for cough., Disp: 180 mL, Rfl: 0   Vitamin D, Ergocalciferol, (DRISDOL) 1.25 MG (50000 UNIT) CAPS capsule, Take 50,000 Units by mouth once a week., Disp: , Rfl:   Observations/Objective: Patient is well-developed, well-nourished in no acute distress.  Resting comfortably  at home.  Head is normocephalic, atraumatic.  No labored breathing.  Speech is clear and coherent with logical content.  Patient is alert and oriented at baseline.    Assessment and Plan: 1. Upper respiratory tract infection, unspecified type (Primary)  2. Flu  Continue currently prescribed regimen.  Emphasized rest, hydration, and medication adherence. Advised  - Continue doxycycline as prescribed. - Encourage rest and hydration. - Advise seeking in person evaluation if symptoms do not improve.  Follow Up Instructions: I discussed the assessment and treatment plan with the patient. The patient was provided an opportunity  to ask questions and all were answered. The patient agreed with the plan and demonstrated an understanding of the instructions.  A copy of instructions were sent to the patient via MyChart unless otherwise noted below.     The patient was advised to call back or seek an in-person evaluation if the symptoms worsen or if the condition fails to improve as anticipated.    Kasandra Knudsen Mayers, PA-C

## 2024-02-25 NOTE — Patient Instructions (Signed)
 [name], thank you for joining Roney Jaffe, PA-C for today's virtual visit.  While this provider is not your primary care provider (PCP), if your PCP is located in our provider database this encounter information will be shared with them immediately following your visit.   A [facility] MyChart account gives you access to today's visit and all your visits, tests, and labs performed at [facility] " click here if you don't have a [facility] MyChart account or go to [email or web address]  Consent: (Patient) Kayla [name] provided verbal consent for this virtual visit at the beginning of the encounter.  Current Medications:  Current Outpatient Medications:    acetaminophen (TYLENOL) 500 MG tablet, Take 1,000 mg by mouth every 6 (six) hours as needed., Disp: , Rfl:    albuterol (VENTOLIN HFA) 108 (90 Base) MCG/ACT inhaler, Inhale 2 puffs into the lungs every 6 (six) hours as needed., Disp: , Rfl:    ASPIRIN LOW DOSE 81 MG tablet, Take 81 mg by mouth daily., Disp: , Rfl:    Boric Acid CRYS, Place 500 mg vaginally at bedtime. Use vaginally every night for 21 days then 1 x per week for the remainder of the pills. (Patient not taking: Reported on 02/06/2024), Disp: 30 g, Rfl: 0   clindamycin (CLEOCIN) 2 % vaginal cream, Place 1 Applicatorful vaginally at bedtime. Insert 1 Applicatorful of medication into the vagina once nightly x 7 nights, Disp: 35 g, Rfl: 0   doxycycline (VIBRAMYCIN) 100 MG capsule, Take 1 capsule (100 mg total) by mouth 2 (two) times daily for 7 days., Disp: 14 capsule, Rfl: 0   esomeprazole (NEXIUM) 40 MG capsule, Take 40 mg by mouth daily at 12 noon., Disp: , Rfl:    hydroxychloroquine (PLAQUENIL) 200 MG tablet, Take 200 mg by mouth 2 (two) times daily., Disp: , Rfl:    ipratropium (ATROVENT HFA) 17 MCG/ACT inhaler, Inhale 2 puffs into the lungs every 6 (six) hours as needed for wheezing., Disp: 1 each, Rfl: 12   lidocaine (XYLOCAINE) 2 % solution, Use as  directed 15 mLs in the mouth or throat as needed for mouth pain., Disp: 100 mL, Rfl: 0   lisinopril (ZESTRIL) 5 MG tablet, Take 5 mg by mouth daily., Disp: , Rfl:    montelukast (SINGULAIR) 10 MG tablet, Take 1 tablet (10 mg total) by mouth at bedtime., Disp: 30 tablet, Rfl: 3   ondansetron (ZOFRAN-ODT) 4 MG disintegrating tablet, Take 1 tablet (4 mg total) by mouth 2 (two) times daily as needed for nausea., Disp: 14 tablet, Rfl: 0   oxyCODONE-acetaminophen (PERCOCET) 10-325 MG tablet, Take 1 tablet by mouth 3 (three) times daily as needed., Disp: , Rfl:    pantoprazole (PROTONIX) 40 MG tablet, Take 1 tablet (40 mg total) by mouth 2 (two) times daily before a meal., Disp: 60 tablet, Rfl: 5   predniSONE (DELTASONE) 5 MG tablet, Take 5 mg by mouth daily with breakfast., Disp: , Rfl:    Prenatal 28-0.8 MG TABS, Take 1 tablet by mouth daily., Disp: 30 tablet, Rfl: 12   promethazine-dextromethorphan (PROMETHAZINE-DM) 6.25-15 MG/5ML syrup, Take 5 mLs by mouth every 8 (eight) hours as needed for cough., Disp: 180 mL, Rfl: 0   Vitamin D, Ergocalciferol, (DRISDOL) 1.25 MG (50000 UNIT) CAPS capsule, Take 50,000 Units by mouth once a week., Disp: , Rfl:    Medications ordered in this encounter:  No orders of the defined types were placed in this encounter.    *If you need refills  on other medications prior to your next appointment, please contact your pharmacy*  Follow-Up: Call back or seek an in-person evaluation if the symptoms worsen or if the condition fails to improve as anticipated.  [facility] [phone]  Other Instructions Influenza, Adult Influenza is also called the flu. It's an infection that affects your respiratory tract. This includes your nose, throat, windpipe, and lungs. The flu is contagious. This means it spreads easily from person to person. It causes symptoms that are like a cold. It can also cause a high fever and body aches. What are the causes? The flu is  caused by the influenza virus. You can get it by: Breathing in droplets that are in the air after an infected person coughs or sneezes. Touching something that has the virus on it and then touching your mouth, nose, or eyes. What increases the risk? You may be more likely to get the flu if: You don't wash your hands often. You're near a lot of people during cold and flu season. You touch your mouth, eyes, or nose without washing your hands first. You don't get a flu shot each year. You may also be more at risk for the flu and serious problems, such as a lung infection called pneumonia, if: You're older than 65. You're pregnant. Your immune system is weak. Your immune system is your body's defense system. You have a long-term, or chronic, condition, such as: Heart, kidney, or lung disease. Diabetes. A liver disorder. Asthma. You're very overweight. You have anemia. This is when you don't have enough red blood cells in your body. What are the signs or symptoms? Flu symptoms often start all of a sudden. They may last 4-14 days and include: Fever and chills. Headaches, body aches, or muscle aches. Sore throat. Cough. Runny or stuffy nose. Discomfort in your chest. Not wanting to eat as much as normal. Feeling weak or tired. Feeling dizzy. Nausea or vomiting. How is this diagnosed? The flu may be diagnosed based on your symptoms and medical history. You may also have a physical exam. A swab may be taken from your nose or throat and tested for the virus. How is this treated? If the flu is found early, you can be treated with antiviral medicine. This may be given to you by mouth or through an IV. It can help you feel less sick and get better faster. Taking care of yourself at home can also help your symptoms get better. Your health care provider may tell you to: Take over-the-counter medicines. Drink lots of fluids. The flu often goes away on its own. If you have very bad symptoms or  problems caused by the flu, you may need to be treated in a hospital. Follow these instructions at home: Activity Rest as needed. Get lots of sleep. Stay home from work or school as told by your provider. Leave home only to go see your provider. Do not leave home for other reasons until you don't have a fever for 24 hours without taking medicine. Eating and drinking Take an oral rehydration solution (ORS). This is a drink that is sold at pharmacies and stores. Drink enough fluid to keep your pee pale yellow. Try to drink small amounts of clear fluids. These include water, ice chips, fruit juice mixed with water, and low-calorie sports drinks. Try to eat bland foods that are easy to digest. These include bananas, applesauce, rice, lean meats, toast, and crackers. Avoid drinks that have a lot of  sugar or caffeine in them. These include energy drinks, regular sports drinks, and soda. Do not drink alcohol. Do not eat spicy or fatty foods. General instructions     Take your medicines only as told by your provider. Use a cool mist humidifier to add moisture to the air in your home. This can make it easier for you to breathe. You should also clean the humidifier every day. To do so: Empty the water. Pour clean water in. Cover your mouth and nose when you cough or sneeze. Wash your hands with soap and water often and for at least 20 seconds. It's extra important to do so after you cough or sneeze. If you can't use soap and water, use hand sanitizer. How is this prevented?  Get a flu shot every year. Ask your provider when you should get your flu shot. Stay away from people who are sick during fall and winter. Fall and winter are cold and flu season. Contact a health care provider if: You get new symptoms. You have chest pain. You have watery poop, also called diarrhea. You have a fever. Your cough gets worse. You start to have more mucus. You feel like you may vomit, or you vomit. Get  help right away if: You become short of breath or have trouble breathing. Your skin or nails turn blue. You have very bad pain or stiffness in your neck. You get a sudden headache or pain in your face or ear. You vomit each time you eat or drink. These symptoms may be an emergency. Call 911 right away. Do not wait to see if the symptoms will go away. Do not drive yourself to the hospital. This information is not intended to replace advice given to you by your health care provider. Make sure you discuss any questions you have with your health care provider. Document Revised: 08/10/2023 Document Reviewed: 12/15/2022 Elsevier Patient Education  2024 Elsevier Inc.   If you have been instructed to have an in-person evaluation today at a local Urgent Care facility, please use the link below. It will take you to a list of all of our available [facility], including address, phone number and hours of operation. Please do not delay care.  [facility]  If you or a family member do not have a primary care provider, use the link below to schedule a visit and establish care. When you choose a [facility] primary care physician or advanced practice provider, you gain a long-term partner in health. Find a Primary Care Provider  Learn more about [facility]'s in-office and virtual care options: [facility] - Get Care Now

## 2024-03-08 ENCOUNTER — Telehealth: Admitting: Nurse Practitioner

## 2024-03-08 DIAGNOSIS — J029 Acute pharyngitis, unspecified: Secondary | ICD-10-CM

## 2024-03-08 NOTE — Patient Instructions (Signed)
[email or web address]

## 2024-03-08 NOTE — Progress Notes (Signed)
 Patient is currently in [facility]. Explained we cannot see a patient out of [place]. Offered [place] services patient is agreeable to schedule with them for out of state service.

## 2024-03-09 ENCOUNTER — Telehealth: Admitting: Nurse Practitioner

## 2024-03-09 DIAGNOSIS — J0100 Acute maxillary sinusitis, unspecified: Secondary | ICD-10-CM | POA: Diagnosis not present

## 2024-03-09 MED ORDER — AMOXICILLIN-POT CLAVULANATE 875-125 MG PO TABS: 1.0000 | ORAL_TABLET | Freq: Two times a day (BID) | ORAL | 0 refills | Status: AC

## 2024-03-09 NOTE — Patient Instructions (Signed)
 Kayla [name], thank you for joining Collins Dean, NP for today's virtual visit.  While this provider is not your primary care provider (PCP), if your PCP is located in our provider database this encounter information will be shared with them immediately following your visit.   A [facility] MyChart account gives you access to today's visit and all your visits, tests, and labs performed at [facility] " click here if you don't have a [facility] MyChart account or go to [email or web address]  Consent: (Patient) Kayla [name] provided verbal consent for this virtual visit at the beginning of the encounter.  Current Medications:  Current Outpatient Medications:    amoxicillin -clavulanate (AUGMENTIN ) 875-125 MG tablet, Take 1 tablet by mouth 2 (two) times daily for 7 days., Disp: 14 tablet, Rfl: 0   acetaminophen  (TYLENOL ) 500 MG tablet, Take 1,000 mg by mouth every 6 (six) hours as needed., Disp: , Rfl:    albuterol  (VENTOLIN  HFA) 108 (90 Base) MCG/ACT inhaler, Inhale 2 puffs into the lungs every 6 (six) hours as needed., Disp: , Rfl:    ASPIRIN  LOW DOSE 81 MG tablet, Take 81 mg by mouth daily., Disp: , Rfl:    Boric Acid CRYS, Place 500 mg vaginally at bedtime. Use vaginally every night for 21 days then 1 x per week for the remainder of the pills. (Patient not taking: Reported on 02/06/2024), Disp: 30 g, Rfl: 0   clindamycin  (CLEOCIN ) 2 % vaginal cream, Place 1 Applicatorful vaginally at bedtime. Insert 1 Applicatorful of medication into the vagina once nightly x 7 nights, Disp: 35 g, Rfl: 0   esomeprazole (NEXIUM) 40 MG capsule, Take 40 mg by mouth daily at 12 noon., Disp: , Rfl:    hydroxychloroquine  (PLAQUENIL ) 200 MG tablet, Take 200 mg by mouth 2 (two) times daily., Disp: , Rfl:    ipratropium (ATROVENT  HFA) 17 MCG/ACT inhaler, Inhale 2 puffs into the lungs every 6 (six) hours as needed for wheezing., Disp: 1 each, Rfl: 12   lidocaine  (XYLOCAINE ) 2 % solution, Use as  directed 15 mLs in the mouth or throat as needed for mouth pain., Disp: 100 mL, Rfl: 0   lisinopril (ZESTRIL) 5 MG tablet, Take 5 mg by mouth daily., Disp: , Rfl:    montelukast  (SINGULAIR ) 10 MG tablet, Take 1 tablet (10 mg total) by mouth at bedtime., Disp: 30 tablet, Rfl: 3   ondansetron  (ZOFRAN -ODT) 4 MG disintegrating tablet, Take 1 tablet (4 mg total) by mouth 2 (two) times daily as needed for nausea., Disp: 14 tablet, Rfl: 0   oxyCODONE -acetaminophen  (PERCOCET) 10-325 MG tablet, Take 1 tablet by mouth 3 (three) times daily as needed., Disp: , Rfl:    pantoprazole  (PROTONIX ) 40 MG tablet, Take 1 tablet (40 mg total) by mouth 2 (two) times daily before a meal., Disp: 60 tablet, Rfl: 5   predniSONE  (DELTASONE ) 5 MG tablet, Take 5 mg by mouth daily with breakfast., Disp: , Rfl:    Prenatal 28-0.8 MG TABS, Take 1 tablet by mouth daily., Disp: 30 tablet, Rfl: 12   promethazine -dextromethorphan  (PROMETHAZINE -DM) 6.25-15 MG/5ML syrup, Take 5 mLs by mouth every 8 (eight) hours as needed for cough., Disp: 180 mL, Rfl: 0   Vitamin D, Ergocalciferol, (DRISDOL) 1.25 MG (50000 UNIT) CAPS capsule, Take 50,000 Units by mouth once a week., Disp: , Rfl:    Medications ordered in this encounter:  Meds ordered this encounter  Medications   amoxicillin -clavulanate (AUGMENTIN ) 875-125 MG tablet    Sig: Take 1 tablet by  mouth 2 (two) times daily for 7 days.    Dispense:  14 tablet    Refill:  0    Supervising Provider:   Corine Dice [[id number]]     *If you need refills on other medications prior to your next appointment, please contact your pharmacy*  Follow-Up: Call back or seek an in-person evaluation if the symptoms worsen or if the condition fails to improve as anticipated.  [facility] [phone]  Other Instructions INSTRUCTIONS: use a humidifier for nasal congestion Drink plenty of fluids, rest and wash hands frequently to avoid the spread of infection Alternate tylenol  and  Motrin  for relief of fever    If you have been instructed to have an in-person evaluation today at a local Urgent Care facility, please use the link below. It will take you to a list of all of our available [facility], including address, phone number and hours of operation. Please do not delay care.  [facility]  If you or a family member do not have a primary care provider, use the link below to schedule a visit and establish care. When you choose a [facility] primary care physician or advanced practice provider, you gain a long-term partner in health. Find a Primary Care Provider  Learn more about [facility]'s in-office and virtual care options: [facility] - Get Care Now

## 2024-03-09 NOTE — Progress Notes (Signed)
 Virtual Visit Consent   Kayla [name], you are scheduled for a virtual visit with a [facility] provider today. Just as with appointments in the office, your consent must be obtained to participate. Your consent will be active for this visit and any virtual visit you may have with one of our providers in the next 365 days. If you have a MyChart account, a copy of this consent can be sent to you electronically.  As this is a virtual visit, video technology does not allow for your provider to perform a traditional examination. This may limit your provider's ability to fully assess your condition. If your provider identifies any concerns that need to be evaluated in person or the need to arrange testing (such as labs, EKG, etc.), we will make arrangements to do so. Although advances in technology are sophisticated, we cannot ensure that it will always work on either your end or our end. If the connection with a video visit is poor, the visit may have to be switched to a telephone visit. With either a video or telephone visit, we are not always able to ensure that we have a secure connection.  By engaging in this virtual visit, you consent to the provision of healthcare and authorize for your insurance to be billed (if applicable) for the services provided during this visit. Depending on your insurance coverage, you may receive a charge related to this service.  I need to obtain your verbal consent now. Are you willing to proceed with your visit today? Kayla [name] has provided verbal consent on 03/09/2024 for a virtual visit (video or telephone). Collins Dean, NP  Date: 03/09/2024 6:42 PM   Virtual Visit via Video Note   I, Collins Dean, connected with  Kayla [name]  ([id number], 05-08-1991) on 03/09/24 at  6:30 PM EDT by a video-enabled telemedicine application and verified that I am speaking with the correct person using two identifiers.  Location: Patient: Virtual Visit Location Patient:  Home Provider: Virtual Visit Location Provider: Home Office   I discussed the limitations of evaluation and management by telemedicine and the availability of in person appointments. The patient expressed understanding and agreed to proceed.    History of Present Illness: Kayla [name] is a 33 y.o. who identifies as a female who was assigned female at birth, and is being seen today for sinusitis symptoms.  Kayla [name] has been experiencing symptoms of sinus pressure, sinus drainage,  sore throat, congestion, cough after being diagnosed with the flu a few weeks ago. She has been prescribed doxycycline , prednisone , inhaler, singulair  and promethazine  with no improvement.   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)    History of IUFD 02/14/2017   Chronic hypertension 03/14/2016   Systemic lupus erythematosus (SLE) affecting pregnancy, antepartum (HCC) 07/06/2015    Allergies:  Allergies  Allergen Reactions   Hydrocodone  Nausea And Vomiting   Zithromax  [Azithromycin ] Itching and Cough   Medications:  Current Outpatient Medications:    amoxicillin -clavulanate (AUGMENTIN ) 875-125 MG tablet, Take 1 tablet by mouth 2 (two) times daily for 7 days., Disp: 14 tablet, Rfl:  0   acetaminophen  (TYLENOL ) 500 MG tablet, Take 1,000 mg by mouth every 6 (six) hours as needed., Disp: , Rfl:    albuterol  (VENTOLIN  HFA) 108 (90 Base) MCG/ACT inhaler, Inhale 2 puffs into the lungs every 6 (six) hours as needed., Disp: , Rfl:    ASPIRIN  LOW DOSE 81 MG tablet, Take 81 mg by  mouth daily., Disp: , Rfl:    Boric Acid CRYS, Place 500 mg vaginally at bedtime. Use vaginally every night for 21 days then 1 x per week for the remainder of the pills. (Patient not taking: Reported on 02/06/2024), Disp: 30 g, Rfl: 0   clindamycin  (CLEOCIN ) 2 % vaginal cream, Place 1 Applicatorful vaginally at bedtime. Insert 1 Applicatorful of medication into the vagina once nightly x 7 nights, Disp: 35 g, Rfl: 0   esomeprazole (NEXIUM) 40 MG capsule, Take 40 mg by mouth daily at 12 noon., Disp: , Rfl:    hydroxychloroquine  (PLAQUENIL ) 200 MG tablet, Take 200 mg by mouth 2 (two) times daily., Disp: , Rfl:    ipratropium (ATROVENT  HFA) 17 MCG/ACT inhaler, Inhale 2 puffs into the lungs every 6 (six) hours as needed for wheezing., Disp: 1 each, Rfl: 12   lidocaine  (XYLOCAINE ) 2 % solution, Use as directed 15 mLs in the mouth or throat as needed for mouth pain., Disp: 100 mL, Rfl: 0   lisinopril (ZESTRIL) 5 MG tablet, Take 5 mg by mouth daily., Disp: , Rfl:    montelukast  (SINGULAIR ) 10 MG tablet, Take 1 tablet (10 mg total) by mouth at bedtime., Disp: 30 tablet, Rfl: 3   ondansetron  (ZOFRAN -ODT) 4 MG disintegrating tablet, Take 1 tablet (4 mg total) by mouth 2 (two) times daily as needed for nausea., Disp: 14 tablet, Rfl: 0   oxyCODONE -acetaminophen  (PERCOCET) 10-325 MG tablet, Take 1 tablet by mouth 3 (three) times daily as needed., Disp: , Rfl:    pantoprazole  (PROTONIX ) 40 MG tablet, Take 1 tablet (40 mg total) by mouth 2 (two) times daily before a meal., Disp: 60 tablet, Rfl: 5   predniSONE  (DELTASONE ) 5 MG tablet, Take 5 mg by mouth daily with breakfast., Disp: , Rfl:    Prenatal 28-0.8 MG TABS, Take 1 tablet by mouth daily., Disp: 30 tablet, Rfl: 12   promethazine -dextromethorphan  (PROMETHAZINE -DM) 6.25-15 MG/5ML syrup, Take 5 mLs by mouth every 8 (eight) hours as needed for cough., Disp: 180 mL, Rfl: 0   Vitamin D, Ergocalciferol, (DRISDOL) 1.25 MG (50000 UNIT) CAPS capsule, Take 50,000 Units by  mouth once a week., Disp: , Rfl:   Observations/Objective: Patient is well-developed, well-nourished in no acute distress.  Resting comfortably  at home.  Head is normocephalic, atraumatic.  No labored breathing. Speech is clear and coherent with logical content.  Patient is alert and oriented at baseline.    Assessment and Plan: 1. Subacute maxillary sinusitis (Primary) - amoxicillin -clavulanate (AUGMENTIN ) 875-125 MG tablet; Take 1 tablet by mouth 2 (two) times daily for 7 days.  Dispense: 14 tablet; Refill: 0 INSTRUCTIONS: use a humidifier for nasal congestion Drink plenty of fluids, rest and wash hands frequently to avoid the spread of infection Alternate tylenol  and Motrin  for relief of fever   Follow Up Instructions: I discussed the assessment and treatment plan with the patient. The patient was provided an opportunity to ask questions and all were answered. The patient agreed with the plan and demonstrated an understanding of the instructions.  A copy of instructions were sent to the patient via MyChart unless otherwise noted below.  The patient was advised to call back or seek an in-person evaluation if the symptoms worsen or if the condition fails to improve as anticipated.    Tuere Nwosu W Rashia Mckesson, NP

## 2024-03-21 ENCOUNTER — Ambulatory Visit (INDEPENDENT_AMBULATORY_CARE_PROVIDER_SITE_OTHER): Admitting: Obstetrics and Gynecology

## 2024-03-21 ENCOUNTER — Other Ambulatory Visit (HOSPITAL_COMMUNITY)
Admission: RE | Admit: 2024-03-21 | Discharge: 2024-03-21 | Disposition: A | Discharge status: Home / Self Care | Source: Ambulatory Visit | Attending: Obstetrics and Gynecology | Admitting: Obstetrics and Gynecology

## 2024-03-21 ENCOUNTER — Encounter: Payer: Self-pay | Admitting: Obstetrics and Gynecology

## 2024-03-21 VITALS — BP 102/68 | HR 100

## 2024-03-21 DIAGNOSIS — N760 Acute vaginitis: Secondary | ICD-10-CM

## 2024-03-21 DIAGNOSIS — R000 Tachycardia, unspecified: Secondary | ICD-10-CM

## 2024-03-21 DIAGNOSIS — Z113 Encounter for screening for infections with a predominantly sexual mode of transmission: Secondary | ICD-10-CM | POA: Diagnosis present

## 2024-03-21 DIAGNOSIS — N951 Menopausal and female climacteric states: Secondary | ICD-10-CM

## 2024-03-21 NOTE — Progress Notes (Signed)
 GYNECOLOGY  VISIT   HPI: 33 y.o.   Single  African American female   [phone] with Patient's last menstrual period was 02/19/2024 (approximate).   here for: Pt reports concerns either with anxiety or perimenopause. Pt reports night sweats and hot flashes-almost feels as if skipping heartbeat. However, has seen cardiologist and all checked out per pt.   Having hot flashes and waking up sweating for 2.5 weeks.  Increased heart rate when hot flashes occur.  Periods are about every 28 days.  No skipped cycles.  Last 3 days. Heavy first day and then spotting or 2 days.  No pain or discomfort.   FH of early perimenopause:  age 76 for mother, age 1 for grandmother.   Woke up at night felt like she was having a heart attack.  Could not breath and had left shoulder pain.  Saw cardiology and had evaluation.  No MI.  Had CT of the chest.  May be having anxiety.  Is fearful to go to sleep.  Not able to find a trigger.  No hx of anxiety or panic attack.    No new medications or removal of meds.   Recent use of Dicloxicillin and amoxicillin  for respiratory infection, finished last week.  Having some itching. Tried Monistat.  Did not help.  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  In school for radiology technician.    GYNECOLOGIC HISTORY: Patient's last menstrual period was 02/19/2024 (approximate). Contraception: none Menopausal hormone therapy: n/a Last 2 paps: 2019-WNL, 03/08/2022-WNL History of abnormal Pap or positive HPV:  no Mammogram: n/a        OB History     Gravida  5   Para  3   Term  2   Preterm  1   AB  2   Living  2      SAB  2   IAB  0   Ectopic  0   Multiple  0   Live Births  2              Patient Active Problem List   Diagnosis Date Noted   Severe pre-eclampsia 08/24/2023   SVD (spontaneous vaginal delivery) 08/22/2023   Long term (current) use of systemic steroids 07/26/2023   Thrombocytopenia affecting pregnancy (HCC)  06/23/2023   Obesity affecting pregnancy 04/14/2023   Alpha thalassemia silent carrier 03/11/2023   Supervision of high risk pregnancy, antepartum, RED CHART patient 02/16/2023   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   Pt off O2 in 2018.   Daily headache    "sometimes" (01/08/2015)   Depression    2017   Gastroesophageal reflux disease without esophagitis 07/13/2020   GERD (gastroesophageal reflux disease)    Hypertension    No longer takes meds   ILD (interstitial lung disease) (HCC)    Insomnia 08/21/2015   Loud P2 (pulmonary S2, second heart sound) 08/21/2015   Lung disease    Lupus  Lupus (systemic lupus erythematosus) (HCC) 08/21/2015   Multifocal pneumonia    Pulmonary hypertension (HCC)    Seasonal allergies 03/02/2023   Sjogren's syndrome (HCC)    SS-A antibody positive 06/02/2016   SS-B antibody positive 06/02/2016   STD (sexually transmitted disease)    Chlamydia   Vaginal Pap smear, abnormal    repeats have been ok   Viral illness 03/02/2023    Past Surgical History:  Procedure Laterality Date   CARDIAC CATHETERIZATION N/A 09/09/2015   Procedure: Right Heart Cath;  Surgeon: Darlis Eisenmenger, MD;  Location: [facility] INVASIVE CV LAB;  Service: Cardiovascular;  Laterality: N/A;   DILATION AND EVACUATION N/A 07/13/2015   Procedure: DILATATION AND EVACUATION;  Surgeon: Malka Sea, DO;  Location: [facility] ORS;  Service:  Gynecology;  Laterality: N/A;   FINGER SURGERY Right 03/2014   "laceration, nerve/artery injury" 2nd digit   VIDEO BRONCHOSCOPY Bilateral 01/12/2015   Procedure: VIDEO BRONCHOSCOPY WITH FLUORO;  Surgeon: Denson Flake, MD;  Location: [facility] ENDOSCOPY;  Service: Cardiopulmonary;  Laterality: Bilateral;    Current Outpatient Medications  Medication Sig Dispense Refill   acetaminophen  (TYLENOL ) 500 MG tablet Take 1,000 mg by mouth every 6 (six) hours as needed.     albuterol  (VENTOLIN  HFA) 108 (90 Base) MCG/ACT inhaler Inhale 2 puffs into the lungs every 6 (six) hours as needed.     ASPIRIN  LOW DOSE 81 MG tablet Take 81 mg by mouth daily.     azaTHIOprine  (IMURAN ) 50 MG tablet Take 150 mg by mouth.     Boric Acid CRYS Place 500 mg vaginally at bedtime. Use vaginally every night for 21 days then 1 x per week for the remainder of the pills. 30 g 0   cetirizine (ZYRTEC) 10 MG tablet Take 10 mg by mouth.     esomeprazole (NEXIUM) 40 MG capsule Take 40 mg by mouth daily at 12 noon.     fluticasone  (FLONASE ) 50 MCG/ACT nasal spray Place 1 spray into the nose.     hydroxychloroquine  (PLAQUENIL ) 200 MG tablet Take 200 mg by mouth 2 (two) times daily.     ipratropium (ATROVENT  HFA) 17 MCG/ACT inhaler Inhale 2 puffs into the lungs every 6 (six) hours as needed for wheezing. 1 each 12   lidocaine  (XYLOCAINE ) 2 % solution Use as directed 15 mLs in the mouth or throat as needed for mouth pain. 100 mL 0   lisinopril (ZESTRIL) 5 MG tablet Take 5 mg by mouth daily.     montelukast  (SINGULAIR ) 10 MG tablet Take 1 tablet (10 mg total) by mouth at bedtime. 30 tablet 3   ondansetron  (ZOFRAN -ODT) 4 MG disintegrating tablet Take 1 tablet (4 mg total) by mouth 2 (two) times daily as needed for nausea. 14 tablet 0   oxyCODONE -acetaminophen  (PERCOCET) 10-325 MG tablet Take 1 tablet by mouth 3 (three) times daily as needed.     pantoprazole  (PROTONIX ) 40 MG tablet Take 1 tablet (40 mg total) by mouth 2 (two) times daily  before a meal. 60 tablet 5   predniSONE  (DELTASONE ) 5 MG tablet Take 5 mg by mouth daily with breakfast.     Prenatal 28-0.8 MG TABS Take 1 tablet by mouth daily. 30 tablet 12   Vitamin D, Ergocalciferol, (DRISDOL) 1.25 MG (50000 UNIT) CAPS capsule Take 50,000 Units by mouth once a week.     No current facility-administered medications for this visit.     ALLERGIES: Hydrocodone  and Zithromax  [azithromycin ]  Family History  Problem Relation Age  of Onset   Hypertension Mother    Deep vein thrombosis Mother    Arthritis Father    Cancer Brother        Found in jaw area   Diabetes Maternal Aunt    Stroke Maternal Grandmother    Hypertension Maternal Grandmother    Diabetes Maternal Grandmother    Hypertension Maternal Grandfather     Social History   Socioeconomic History   Marital status: Single    Spouse name: Not on file   Number of children: Not on file   Years of education: Not on file   Highest education level: Not on file  Occupational History   Occupation: [employer]     Comment: Workers Youth worker  Tobacco Use   Smoking status: Former    Current packs/day: 0.00    Average packs/day: 0.1 packs/day for 5.0 years (0.5 ttl pk-yrs)    Types: Cigarettes    Start date: 11/20/2009    Quit date: 11/20/2014    Years since quitting: 9.3   Smokeless tobacco: Never  Vaping Use   Vaping status: Never Used  Substance and Sexual Activity   Alcohol use: Not Currently    Comment: Occas   Drug use: No   Sexual activity: Not Currently    Birth control/protection: None    Comment: First IC <15 y/o, Hx of CT+  Other Topics Concern   Not on file  Social History Narrative   Not on file   Social Drivers of Health   Financial Resource Strain: Not on file  Food Insecurity: No Food Insecurity (03/04/2024)   [facility]   Hunger Vital Sign    Worried About Running Out of Food in the Last Year: Never true    Ran Out of Food in the Last Year: Never  true  Transportation Needs: No Transportation Needs (03/04/2024)   Received from [facility]    In the past 12 months, has lack of transportation kept you from medical appointments or from getting medications?: No    Lack of Transportation (Non-Medical): No  Physical Activity: Not on file  Stress: Not on file  Social Connections: Not on file  Intimate Partner Violence: Not At Risk (08/25/2023)   Humiliation, Afraid, Rape, and Kick questionnaire    Fear of Current or Ex-Partner: No    Emotionally Abused: No    Physically Abused: No    Sexually Abused: No    Review of Systems  All other systems reviewed and are negative.   PHYSICAL EXAMINATION:   BP 102/68   Pulse 100   LMP 02/19/2024 (Approximate)   SpO2 98%     General appearance: alert, cooperative and appears stated age   Pelvic: External genitalia:  slight grey pink coloration of the vulva.              Urethra:  normal appearing urethra with no masses, tenderness or lesions              Bartholins and Skenes: normal                 Vagina: normal appearing vagina with normal color and discharge, no lesions              Cervix: no lesions.  Mild menstrual flow.                 Bimanual Exam:  Uterus:  normal size, contour, position, consistency, mobility, non-tender  Adnexa: no mass, fullness, tenderness            Chaperone was present for exam:  declined by patient.   ASSESSMENT:  Menopausal symptoms.  Increased heart rate.  Vaginitis. Recent abx.   PLAN:  Will check FSH, LH, estradiol , TSH, free T3, free T4.  We discussed potential tx with Paxil for perimenopausal symptoms, which can also treat anxiety.  She may need to also follow up with her PCP.  Vaginitis swab. Terazole if needed for yeast.  She is in agreement.  Keep annual exam appt.   28 min  total time was spent for this patient encounter, including preparation, face-to-face counseling with the  patient, coordination of care, and documentation of the encounter.

## 2024-03-22 ENCOUNTER — Other Ambulatory Visit: Payer: Self-pay | Admitting: Obstetrics and Gynecology

## 2024-03-22 ENCOUNTER — Encounter: Payer: Self-pay | Admitting: Obstetrics and Gynecology

## 2024-03-22 DIAGNOSIS — R7989 Other specified abnormal findings of blood chemistry: Secondary | ICD-10-CM

## 2024-03-22 LAB — FSH/LH
FSH: 6.1 m[IU]/mL
LH: 4.1 m[IU]/mL

## 2024-03-22 LAB — TSH: TSH: 0.34 m[IU]/L — ABNORMAL LOW

## 2024-03-22 LAB — CERVICOVAGINAL ANCILLARY ONLY
Bacterial Vaginitis (gardnerella): NEGATIVE
Candida Glabrata: NEGATIVE
Candida Vaginitis: NEGATIVE
Comment: NEGATIVE
Comment: NEGATIVE
Comment: NEGATIVE
Comment: NEGATIVE
Trichomonas: NEGATIVE

## 2024-03-22 LAB — T3, FREE: T3, Free: 4.3 pg/mL — ABNORMAL HIGH (ref 2.3–4.2)

## 2024-03-22 LAB — ESTRADIOL: Estradiol: 51 pg/mL

## 2024-03-22 LAB — T4, FREE: Free T4: 1.6 ng/dL (ref 0.8–1.8)

## 2024-03-22 MED ORDER — NYSTATIN-TRIAMCINOLONE 100000-0.1 UNIT/GM-% EX OINT: 1.0000 | TOPICAL_OINTMENT | Freq: Two times a day (BID) | CUTANEOUS | 0 refills | Status: DC

## 2024-03-25 NOTE — Telephone Encounter (Signed)
 Referral to [facility] placed, routing to [facility].

## 2024-03-30 ENCOUNTER — Telehealth: Admitting: Physician Assistant

## 2024-03-30 ENCOUNTER — Telehealth: Admitting: Family Medicine

## 2024-03-30 DIAGNOSIS — N760 Acute vaginitis: Secondary | ICD-10-CM | POA: Diagnosis not present

## 2024-03-30 MED ORDER — CLINDAMYCIN PHOSPHATE 2 % VA CREA: 1.0000 | TOPICAL_CREAM | Freq: Every day | VAGINAL | 0 refills | Status: DC

## 2024-03-30 MED ORDER — METRONIDAZOLE 500 MG PO TABS: 500.0000 mg | ORAL_TABLET | Freq: Two times a day (BID) | ORAL | 0 refills | Status: DC

## 2024-03-30 NOTE — Progress Notes (Signed)
 Pt states Metronidazole  does not work.  Prefers Clindamycin  Cream.

## 2024-03-30 NOTE — Progress Notes (Signed)
 This is a duplicate entry by the patient.

## 2024-03-30 NOTE — Addendum Note (Signed)
 Addended byLouvenia Roys on: 03/30/2024 03:39 PM   Modules accepted: Orders

## 2024-03-30 NOTE — Progress Notes (Signed)
E-Visit for Vaginal Symptoms  We are sorry that you are not feeling well. Here is how we plan to help! Based on what you shared with me it looks like you: May have a vaginosis due to bacteria  Vaginosis is an inflammation of the vagina that can result in discharge, itching and pain. The cause is usually a change in the normal balance of vaginal bacteria or an infection. Vaginosis can also result from reduced estrogen levels after menopause.  The most common causes of vaginosis are:   Bacterial vaginosis which results from an overgrowth of one on several organisms that are normally present in your vagina.   Yeast infections which are caused by a naturally occurring fungus called candida.   Vaginal atrophy (atrophic vaginosis) which results from the thinning of the vagina from reduced estrogen levels after menopause.   Trichomoniasis which is caused by a parasite and is commonly transmitted by sexual intercourse.  Factors that increase your risk of developing vaginosis include: Medications, such as antibiotics and steroids Uncontrolled diabetes Use of hygiene products such as bubble bath, vaginal spray or vaginal deodorant Douching Wearing damp or tight-fitting clothing Using an intrauterine device (IUD) for birth control Hormonal changes, such as those associated with pregnancy, birth control pills or menopause Sexual activity Having a sexually transmitted infection  Your treatment plan is Metronidazole or Flagyl 500mg twice a day for 7 days.  I have electronically sent this prescription into the pharmacy that you have chosen.  Be sure to take all of the medication as directed. Stop taking any medication if you develop a rash, tongue swelling or shortness of breath. Mothers who are breast feeding should consider pumping and discarding their breast milk while on these antibiotics. However, there is no consensus that infant exposure at these doses would be harmful.  Remember that  medication creams can weaken latex condoms. .   HOME CARE:  Good hygiene may prevent some types of vaginosis from recurring and may relieve some symptoms:  Avoid baths, hot tubs and whirlpool spas. Rinse soap from your outer genital area after a shower, and dry the area well to prevent irritation. Don't use scented or harsh soaps, such as those with deodorant or antibacterial action. Avoid irritants. These include scented tampons and pads. Wipe from front to back after using the toilet. Doing so avoids spreading fecal bacteria to your vagina.  Other things that may help prevent vaginosis include:  Don't douche. Your vagina doesn't require cleansing other than normal bathing. Repetitive douching disrupts the normal organisms that reside in the vagina and can actually increase your risk of vaginal infection. Douching won't clear up a vaginal infection. Use a latex condom. Both female and female latex condoms may help you avoid infections spread by sexual contact. Wear cotton underwear. Also wear pantyhose with a cotton crotch. If you feel comfortable without it, skip wearing underwear to bed. Yeast thrives in moist environments Your symptoms should improve in the next day or two.  GET HELP RIGHT AWAY IF:  You have pain in your lower abdomen ( pelvic area or over your ovaries) You develop nausea or vomiting You develop a fever Your discharge changes or worsens You have persistent pain with intercourse You develop shortness of breath, a rapid pulse, or you faint.  These symptoms could be signs of problems or infections that need to be evaluated by a medical provider now.  MAKE SURE YOU   Understand these instructions. Will watch your condition. Will get help right   away if you are not doing well or get worse.  Thank you for choosing an e-visit.  Your e-visit answers were reviewed by a board certified advanced clinical practitioner to complete your personal care plan. Depending upon the  condition, your plan could have included both over the counter or prescription medications.  Please review your pharmacy choice. Make sure the pharmacy is open so you can pick up prescription now. If there is a problem, you may contact your provider through MyChart messaging and have the prescription routed to another pharmacy.  Your safety is important to us. If you have drug allergies check your prescription carefully.   For the next 24 hours you can use MyChart to ask questions about today's visit, request a non-urgent call back, or ask for a work or school excuse. You will get an email in the next two days asking about your experience. I hope that your e-visit has been valuable and will speed your recovery. I have spent 5 minutes in review of e-visit questionnaire, review and updating patient chart, medical decision making and response to patient.   Ocean Schildt, PA-C    

## 2024-03-31 MED ORDER — CLINDAMYCIN PHOSPHATE 2 % VA CREA: 1.0000 | TOPICAL_CREAM | Freq: Every day | VAGINAL | 0 refills | Status: AC

## 2024-03-31 MED ORDER — METRONIDAZOLE 500 MG PO TABS: 500.0000 mg | ORAL_TABLET | Freq: Two times a day (BID) | ORAL | 0 refills | Status: AC

## 2024-03-31 NOTE — Addendum Note (Signed)
 Addended by: Malcom Scriver on: 03/31/2024 01:52 PM   Modules accepted: Orders

## 2024-03-31 NOTE — Addendum Note (Signed)
 Addended by: Malcom Scriver on: 03/31/2024 12:56 PM   Modules accepted: Orders

## 2024-04-11 ENCOUNTER — Ambulatory Visit (HOSPITAL_COMMUNITY): Admission: EM | Admit: 2024-04-11 | Discharge: 2024-04-11 | Discharge status: Against Medical Advice

## 2024-04-11 NOTE — ED Notes (Signed)
 Patient called from lobby 2 times with no response. Front Information systems manager states they do not see the Patient currently. Patient LWBS before triage.

## 2024-04-17 ENCOUNTER — Ambulatory Visit (HOSPITAL_COMMUNITY)
Admission: EM | Admit: 2024-04-17 | Discharge: 2024-04-17 | Disposition: A | Discharge status: Home / Self Care | Attending: Physician Assistant | Admitting: Physician Assistant

## 2024-04-17 ENCOUNTER — Encounter (HOSPITAL_COMMUNITY): Payer: Self-pay

## 2024-04-17 ENCOUNTER — Ambulatory Visit (INDEPENDENT_AMBULATORY_CARE_PROVIDER_SITE_OTHER)

## 2024-04-17 DIAGNOSIS — S93402A Sprain of unspecified ligament of left ankle, initial encounter: Secondary | ICD-10-CM

## 2024-04-17 DIAGNOSIS — J069 Acute upper respiratory infection, unspecified: Secondary | ICD-10-CM

## 2024-04-17 DIAGNOSIS — B360 Pityriasis versicolor: Secondary | ICD-10-CM

## 2024-04-17 DIAGNOSIS — M25572 Pain in left ankle and joints of left foot: Secondary | ICD-10-CM | POA: Diagnosis not present

## 2024-04-17 LAB — POCT URINALYSIS DIP (MANUAL ENTRY)
Bilirubin, UA: NEGATIVE
Glucose, UA: NEGATIVE mg/dL
Ketones, POC UA: NEGATIVE mg/dL
Leukocytes, UA: NEGATIVE
Nitrite, UA: NEGATIVE
Spec Grav, UA: 1.02 (ref 1.010–1.025)
Urobilinogen, UA: 1 U/dL
pH, UA: 6 (ref 5.0–8.0)

## 2024-04-17 LAB — POC COVID19/FLU A&B COMBO
Covid Antigen, POC: NEGATIVE
Influenza A Antigen, POC: NEGATIVE
Influenza B Antigen, POC: NEGATIVE

## 2024-04-17 MED ORDER — TERBINAFINE HCL 1 % EX CREA: 1.0000 | TOPICAL_CREAM | Freq: Two times a day (BID) | CUTANEOUS | 0 refills | Status: DC

## 2024-04-17 MED ORDER — PROMETHAZINE-DM 6.25-15 MG/5ML PO SYRP: 5.0000 mL | ORAL_SOLUTION | Freq: Four times a day (QID) | ORAL | 0 refills | Status: DC | PRN

## 2024-04-17 MED ORDER — IBUPROFEN 800 MG PO TABS: 800.0000 mg | ORAL_TABLET | Freq: Three times a day (TID) | ORAL | 0 refills | Status: AC

## 2024-04-17 NOTE — Discharge Instructions (Signed)
 Your x-ray did not show any broken bones.  I believe that you have sprained the ankle.  Please keep this elevated and use ice for 15 minutes at a time 3-4 times a day.  Use the brace for comfort and support.  Follow-up with sports medicine if your symptoms or not improving quickly.  If anything worsens please return for reevaluation.  I believe that your cough/congestion symptoms are related to a virus.  The ibuprofen  that you are prescribed for your ankle should also help with the body pain.  Do not take additional NSAIDs with this medication including aspirin , ibuprofen /Advil , naproxen /Aleve .  Use Promethazine  DM for cough.  This will make you sleepy so do not drive or drink alcohol with taking it.  If anything worsens or changes and you have high fever, worsening cough, shortness of breath, chest pain, nausea, vomiting need to be seen immediately.  I believe that that rash is related to something called tinea vesicular.  Apply terbinafine twice daily.  Do not apply this to the area directly around your eye.  Follow-up with your primary care if this is not improving within a few weeks.

## 2024-04-17 NOTE — ED Triage Notes (Signed)
 Patient here today with c/o mid back pain since Saturday.   Patient also c/o left ankle injury on Friday. Patient states that Kayla [name] was outside playing with her daughter and stepped wrong. Patient states that since Kayla [name] has been having increased pain with weightbearing.   Patient also states that Kayla [name] has been having some diarrhea since Friday evening. LMP started Friday and has been having some abd cramping.

## 2024-04-17 NOTE — ED Provider Notes (Signed)
 [facility]    CSN: [id number] Arrival date & time: 04/17/24  0813      History   Chief Complaint Chief Complaint  Patient presents with   Back Pain   Ankle Pain    HPI Kayla [name] is a 33 y.o. female.   Patient presents today with several concerns.  Her primary concern today is a several day history of left lateral ankle pain.  Reports that she was walking and took a misstep and so inverted her left ankle.  She was not able to immediately apply pressure but has been able to walk since then.  She reports pain is rated 9 on a 0 10 pain scale, described as sharp, no aggravating relieving factors identified.  Denies any numbness or paresthesias in her foot.  She denies previous injury or surgery involving her ankle.  She has been taking Tylenol  and ibuprofen  with minimal improvement of pain.  She also reports a 2-day history of URI symptoms.  Reports sore throat, cough, congestion, body aches.  Denies any fever, chest pain, shortness of breath.  She does report occasional episodes where she feels short of breath but this is short-lived.  She also reports an episode where she had blood-tinged mucus this is since resolved.  She has been taking TheraFlu and other over-the-counter medications without improvement of symptoms.  She had influenza A earlier this year but has not had influenza B.  She has not had COVID recently.  Denies any known sick contacts.  Denies any recent antibiotics or steroids.  She denies history of asthma but does have interstitial lung disease.  She has not required her albuterol  recently.  Denies any concern for pregnancy.    Past Medical History:  Diagnosis Date   Acute respiratory failure with hypoxemia (HCC) 09/19/2017   Acute respiratory failure with hypoxia (HCC)    Arthritis    "hands and legs" (01/08/2015)   CAP (community acquired pneumonia) 01/07/2015   Chronic Respiratory failure with oxygen requirement affecting pregnancy, antepartum  05/16/2016   Resolved pulmonary HTN on 2018 echo (in our epic).   Pt off O2 in 2018.   Daily headache    "sometimes" (01/08/2015)   Depression    2017   Gastroesophageal reflux disease without esophagitis 07/13/2020   GERD (gastroesophageal reflux disease)    Hypertension    No longer takes meds   ILD (interstitial lung disease) (HCC)    Insomnia 08/21/2015   Loud P2 (pulmonary S2, second heart sound) 08/21/2015   Lung disease    Lupus    Lupus (systemic lupus erythematosus) (HCC) 08/21/2015   Multifocal pneumonia    Pulmonary hypertension (HCC)    Seasonal allergies 03/02/2023   Sjogren's syndrome (HCC)    SS-A antibody positive 06/02/2016   SS-B antibody positive 06/02/2016   STD (sexually transmitted disease)    Chlamydia   Vaginal Pap smear, abnormal    repeats have been ok   Viral illness 03/02/2023    Patient Active Problem List   Diagnosis Date Noted   Severe pre-eclampsia 08/24/2023   SVD (spontaneous vaginal delivery) 08/22/2023   Long term (current) use of systemic steroids 07/26/2023   Thrombocytopenia affecting pregnancy (HCC) 06/23/2023   Obesity affecting pregnancy 04/14/2023   Alpha thalassemia silent carrier 03/11/2023   Supervision of high risk pregnancy, antepartum, RED CHART patient 02/16/2023   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)    History of IUFD 02/14/2017   Chronic hypertension 03/14/2016   Systemic lupus  erythematosus (SLE) affecting pregnancy, antepartum (HCC) 07/06/2015    Past Surgical History:  Procedure Laterality Date   CARDIAC CATHETERIZATION N/A 09/09/2015   Procedure: Right Heart Cath;  Surgeon: Darlis Eisenmenger, MD;  Location: [facility] INVASIVE CV LAB;  Service: Cardiovascular;  Laterality: N/A;   DILATION AND EVACUATION N/A 07/13/2015   Procedure: DILATATION AND EVACUATION;  Surgeon: Malka Sea, DO;  Location: [facility]S;  Service: Gynecology;  Laterality: N/A;   FINGER SURGERY Right 03/2014   "laceration, nerve/artery  injury" 2nd digit   VIDEO BRONCHOSCOPY Bilateral 01/12/2015   Procedure: VIDEO BRONCHOSCOPY WITH FLUORO;  Surgeon: Denson Flake, MD;  Location: [facility] ENDOSCOPY;  Service: Cardiopulmonary;  Laterality: Bilateral;    OB History     Gravida  5   Para  3   Term  2   Preterm  1   AB  2   Living  2      SAB  2   IAB  0   Ectopic  0   Multiple  0   Live Births  2            Home Medications    Prior to Admission medications   Medication Sig Start Date End Date Taking? Authorizing Provider  ibuprofen  (ADVIL ) 800 MG tablet Take 1 tablet (800 mg total) by mouth 3 (three) times daily. 04/17/24  Yes Iliany Losier K, PA-C  promethazine -dextromethorphan  (PROMETHAZINE -DM) 6.25-15 MG/5ML syrup Take 5 mLs by mouth 4 (four) times daily as needed for cough. 04/17/24  Yes Lilia Letterman K, PA-C  terbinafine (LAMISIL) 1 % cream Apply 1 Application topically 2 (two) times daily. 04/17/24  Yes Maryann Mccall K, PA-C  acetaminophen  (TYLENOL ) 500 MG tablet Take 1,000 mg by mouth every 6 (six) hours as needed.    [provider]  albuterol  (VENTOLIN  HFA) 108 (90 Base) MCG/ACT inhaler Inhale 2 puffs into the lungs every 6 (six) hours as needed. 09/26/23   [provider]  ASPIRIN  LOW DOSE 81 MG tablet Take 81 mg by mouth daily. 09/26/23   [provider]  azaTHIOprine  (IMURAN ) 50 MG tablet Take 150 mg by mouth. 09/04/23 09/03/24  [provider]  cetirizine (ZYRTEC) 10 MG tablet Take 10 mg by mouth. 03/03/24 03/03/25  [provider]  esomeprazole (NEXIUM) 40 MG capsule Take 40 mg by mouth daily at 12 noon. 09/04/23 09/03/24  [provider]  fluticasone  (FLONASE ) 50 MCG/ACT nasal spray Place 1 spray into the nose. 03/03/24 03/03/25  [provider]  hydroxychloroquine  (PLAQUENIL ) 200 MG tablet Take 200 mg by mouth 2 (two) times daily.    [provider]  ipratropium (ATROVENT  HFA) 17 MCG/ACT inhaler Inhale 2 puffs into the lungs every  6 (six) hours as needed for wheezing. 05/04/23   Teena Feast, MD  lisinopril (ZESTRIL) 5 MG tablet Take 5 mg by mouth daily. Patient not taking: Reported on 04/17/2024    [provider]  montelukast  (SINGULAIR ) 10 MG tablet Take 1 tablet (10 mg total) by mouth at bedtime. 02/21/24   Crain, Whitney L, PA  ondansetron  (ZOFRAN -ODT) 4 MG disintegrating tablet Take 1 tablet (4 mg total) by mouth 2 (two) times daily as needed for nausea. 01/16/24   Andee Bamberger, NP  oxyCODONE -acetaminophen  (PERCOCET) 10-325 MG tablet Take 1 tablet by mouth 3 (three) times daily as needed. 08/29/23   [provider]  pantoprazole  (PROTONIX ) 40 MG tablet Take 1 tablet (40 mg total) by mouth 2 (two) times daily before  a meal. 04/11/23   Teena Feast, MD  predniSONE  (DELTASONE ) 5 MG tablet Take 5 mg by mouth daily with breakfast.    [provider]  Prenatal 28-0.8 MG TABS Take 1 tablet by mouth daily. 02/23/23   Tresia Fruit, MD  Vitamin D, Ergocalciferol, (DRISDOL) 1.25 MG (50000 UNIT) CAPS capsule Take 50,000 Units by mouth once a week. 11/30/23   [provider]  amLODipine  (NORVASC ) 5 MG tablet Take 1 tablet (5 mg total) by mouth daily. 09/22/17 11/09/20  Lemmie Pyo, NP    Family History Family History  Problem Relation Age of Onset   Hypertension Mother    Deep vein thrombosis Mother    Arthritis Father    Cancer Brother        Found in jaw area   Diabetes Maternal Aunt    Stroke Maternal Grandmother    Hypertension Maternal Grandmother    Diabetes Maternal Grandmother    Hypertension Maternal Grandfather     Social History Social History   Tobacco Use   Smoking status: Former    Current packs/day: 0.00    Average packs/day: 0.1 packs/day for 5.0 years (0.5 ttl pk-yrs)    Types: Cigarettes    Start date: 11/20/2009    Quit date: 11/20/2014    Years since quitting: 9.4   Smokeless tobacco: Never  Vaping Use   Vaping status: Never Used  Substance  Use Topics   Alcohol use: Not Currently    Comment: Occas   Drug use: No     Allergies   Hydrocodone  and Zithromax  [azithromycin ]   Review of Systems Review of Systems  Constitutional:  Positive for activity change. Negative for appetite change, fatigue and fever.  HENT:  Positive for congestion and sore throat. Negative for sinus pressure and sneezing.   Respiratory:  Positive for cough. Negative for shortness of breath.   Cardiovascular:  Negative for chest pain.  Gastrointestinal:  Positive for diarrhea and nausea. Negative for abdominal pain and vomiting.  Musculoskeletal:  Positive for arthralgias, gait problem, joint swelling and myalgias.     Physical Exam Triage Vital Signs ED Triage Vitals  Encounter Vitals Group     BP 04/17/24 0936 120/81     Systolic BP Percentile --      Diastolic BP Percentile --      Pulse Rate 04/17/24 0936 79     Resp 04/17/24 0936 16     Temp 04/17/24 0936 98.8 F (37.1 C)     Temp Source 04/17/24 0936 Oral     SpO2 04/17/24 0936 96 %     Weight --      Height --      Head Circumference --      Peak Flow --      Pain Score 04/17/24 0937 10     Pain Loc --      Pain Education --      Exclude from Growth Chart --    No data found.  Updated Vital Signs BP 120/81 (BP Location: Right Arm)   Pulse 79   Temp 98.8 F (37.1 C) (Oral)   Resp 16   LMP 04/13/2024 (Approximate)   SpO2 96%   Breastfeeding No   Visual Acuity Right Eye Distance:   Left Eye Distance:   Bilateral Distance:    Right Eye Near:   Left Eye Near:    Bilateral Near:     Physical Exam Vitals reviewed.  Constitutional:  General: She is awake. She is not in acute distress.    Appearance: Normal appearance. She is well-developed. She is not ill-appearing.     Comments: Very pleasant female appears stated age in no acute distress sitting comfortably in exam room  HENT:     Head: Normocephalic and atraumatic.     Right Ear: Tympanic membrane, ear canal  and external ear normal. Tympanic membrane is not erythematous or bulging.     Left Ear: Tympanic membrane, ear canal and external ear normal. Tympanic membrane is not erythematous or bulging.     Nose:     Right Sinus: No maxillary sinus tenderness or frontal sinus tenderness.     Left Sinus: No maxillary sinus tenderness or frontal sinus tenderness.     Mouth/Throat:     Pharynx: Uvula midline. No oropharyngeal exudate or posterior oropharyngeal erythema.  Cardiovascular:     Rate and Rhythm: Normal rate and regular rhythm.     Heart sounds: Normal heart sounds, S1 normal and S2 normal. No murmur heard. Pulmonary:     Effort: Pulmonary effort is normal.     Breath sounds: Normal breath sounds. No wheezing, rhonchi or rales.     Comments: Clear to auscultation bilaterally Musculoskeletal:     Left ankle: Swelling present. Tenderness present over the lateral malleolus. No medial malleolus tenderness. Decreased range of motion.     Comments: Tenderness palpation of the lateral left ankle without deformity.  Decreased range of motion with inversion secondary to pain.  Foot neurovascularly intact.  Skin:    Comments: Hypopigmentation with scaling noted glabella and superior to eyebrows bilaterally.  Psychiatric:        Behavior: Behavior is cooperative.      UC Treatments / Results  Labs (all labs ordered are listed, but only abnormal results are displayed) Labs Reviewed  POCT URINALYSIS DIP (MANUAL ENTRY) - Abnormal; Notable for the following components:      Result Value   Blood, UA trace-intact (*)    Protein Ur, POC trace (*)    All other components within normal limits  POC COVID19/FLU A&B COMBO    EKG   Radiology DG Ankle Complete Left Result Date: 04/17/2024 CLINICAL DATA:  Left ankle pain.  Ankle injury on Friday. EXAM: LEFT ANKLE COMPLETE - 3+ VIEW COMPARISON:  None Available. FINDINGS: There is no evidence of fracture, dislocation, or joint effusion. The ankle mortise  is preserved. There is no evidence of arthropathy or other focal bone abnormality. Soft tissues are unremarkable. IMPRESSION: Negative radiographs of the left ankle. Electronically Signed   By: Chadwick Colonel M.D.   On: 04/17/2024 10:50    Procedures Procedures (including critical care time)  Medications Ordered in UC Medications - No data to display  Initial Impression / Assessment and Plan / UC Course  I have reviewed the triage vital signs and the nursing notes.  Pertinent labs & imaging results that were available during my care of the patient were reviewed by me and considered in my medical decision making (see chart for details).     Patient is well-appearing, afebrile, nontoxic, nontachycardic.  We discussed that her URI symptoms are likely viral in nature given short duration of symptoms.  No evidence of acute infection on physical exam that warrant initiation of antibiotics.  She does negative for COVID and flu.  We discussed symptomatic care and she was given Promethazine  DM for cough.  We discussed that this can be sedating and she is not to  drive or drink alcohol while taking it.  She was given ibuprofen  for pain relief and we discussed that she is not to take additional NSAIDs with this medication.  She was concerned about a kidney infection because she is having widespread body pain but also back pain.  UA was obtained that showed blood consistent with being on her menstrual cycle but no other evidence of infection.  She can use over-the-counter medication including Mucinex , Flonase , Tylenol , nasal saline/sinus rinses for additional symptom relief.  Recommend that she rest and drink plenty of fluid.  Discussed that if her symptoms are not improving within a week or if anything worsen she needs to be seen immediately.  Strict return precautions given.  Excuse note provided.  Patient is well-appearing, afebrile, nontoxic, nontachycardic.  X-rays obtained of ankle  given mechanism of  injury and [place] ankle rules that showed no acute osseous abnormality.  Suspect sprain as etiology of symptoms.  She was encouraged to use RICE protocol.  She was placed in a brace for comfort and support.  Will start ibuprofen  800 mg for pain relief.  Discussed that this should not be taken with additional NSAIDs due to risk of GI bleeding.  She can use acetaminophen /Tylenol  for breakthrough pain.  Discussed that if symptoms or not improving quickly patient should follow-up with sports medicine and was given contact information for local provider with instruction to call to schedule an appointment as needed.  Strict return precautions given.  Work excuse note provided.  As patient was being discharged she requested a cream be sent in for the pruritic rash that she has on her face.  She reports it has been present for a few days.  She has not tried any over-the-counter medication.  Upon closer examination she has hypopigmented area with overlying scaling concerning for tinea versicolor.  She was started on terbinafine twice daily for minimum 2 weeks.  Discussed that she is not to apply this to the area directly around her eyes.  She is to follow-up with her primary care if this does not resolve and discuss she should return if anything worsens.   Final Clinical Impressions(s) / UC Diagnoses   Final diagnoses:  Acute left ankle pain  Sprain of left ankle, unspecified ligament, initial encounter  Upper respiratory tract infection, unspecified type  Tinea versicolor     Discharge Instructions      Your x-ray did not show any broken bones.  I believe that you have sprained the ankle.  Please keep this elevated and use ice for 15 minutes at a time 3-4 times a day.  Use the brace for comfort and support.  Follow-up with sports medicine if your symptoms or not improving quickly.  If anything worsens please return for reevaluation.  I believe that your cough/congestion symptoms are related to a virus.   The ibuprofen  that you are prescribed for your ankle should also help with the body pain.  Do not take additional NSAIDs with this medication including aspirin , ibuprofen /Advil , naproxen /Aleve .  Use Promethazine  DM for cough.  This will make you sleepy so do not drive or drink alcohol with taking it.  If anything worsens or changes and you have high fever, worsening cough, shortness of breath, chest pain, nausea, vomiting need to be seen immediately.  I believe that that rash is related to something called tinea vesicular.  Apply terbinafine twice daily.  Do not apply this to the area directly around your eye.  Follow-up with your primary care  if this is not improving within a few weeks.   ED Prescriptions     Medication Sig Dispense Auth. Provider   promethazine -dextromethorphan  (PROMETHAZINE -DM) 6.25-15 MG/5ML syrup Take 5 mLs by mouth 4 (four) times daily as needed for cough. 118 mL Aron Needles K, PA-C   ibuprofen  (ADVIL ) 800 MG tablet Take 1 tablet (800 mg total) by mouth 3 (three) times daily. 21 tablet Gigi Onstad K, PA-C   terbinafine (LAMISIL) 1 % cream Apply 1 Application topically 2 (two) times daily. 30 g Josha Weekley K, PA-C      PDMP not reviewed this encounter.   Budd Cargo, PA-C 04/17/24 1102

## 2024-04-30 ENCOUNTER — Ambulatory Visit: Admitting: Obstetrics and Gynecology

## 2024-05-17 ENCOUNTER — Ambulatory Visit (HOSPITAL_COMMUNITY)
Admission: EM | Admit: 2024-05-17 | Discharge: 2024-05-17 | Disposition: A | Discharge status: Home / Self Care | Attending: Emergency Medicine | Admitting: Emergency Medicine

## 2024-05-17 ENCOUNTER — Encounter (HOSPITAL_COMMUNITY): Payer: Self-pay

## 2024-05-17 DIAGNOSIS — Z7952 Long term (current) use of systemic steroids: Secondary | ICD-10-CM | POA: Insufficient documentation

## 2024-05-17 DIAGNOSIS — M797 Fibromyalgia: Secondary | ICD-10-CM | POA: Insufficient documentation

## 2024-05-17 DIAGNOSIS — N898 Other specified noninflammatory disorders of vagina: Secondary | ICD-10-CM | POA: Insufficient documentation

## 2024-05-17 DIAGNOSIS — M542 Cervicalgia: Secondary | ICD-10-CM | POA: Insufficient documentation

## 2024-05-17 DIAGNOSIS — B9689 Other specified bacterial agents as the cause of diseases classified elsewhere: Secondary | ICD-10-CM | POA: Diagnosis present

## 2024-05-17 DIAGNOSIS — M549 Dorsalgia, unspecified: Secondary | ICD-10-CM | POA: Diagnosis present

## 2024-05-17 DIAGNOSIS — Z87891 Personal history of nicotine dependence: Secondary | ICD-10-CM | POA: Insufficient documentation

## 2024-05-17 DIAGNOSIS — N760 Acute vaginitis: Secondary | ICD-10-CM | POA: Diagnosis present

## 2024-05-17 DIAGNOSIS — Z79899 Other long term (current) drug therapy: Secondary | ICD-10-CM | POA: Diagnosis not present

## 2024-05-17 DIAGNOSIS — M328 Other forms of systemic lupus erythematosus: Secondary | ICD-10-CM | POA: Diagnosis present

## 2024-05-17 DIAGNOSIS — R1310 Dysphagia, unspecified: Secondary | ICD-10-CM | POA: Diagnosis present

## 2024-05-17 DIAGNOSIS — R350 Frequency of micturition: Secondary | ICD-10-CM | POA: Diagnosis present

## 2024-05-17 LAB — POCT URINALYSIS DIP (MANUAL ENTRY)
Blood, UA: NEGATIVE
Glucose, UA: NEGATIVE mg/dL
Leukocytes, UA: NEGATIVE
Nitrite, UA: NEGATIVE
Protein Ur, POC: 100 mg/dL — AB
Spec Grav, UA: >=1.03 — AB (ref 1.010–1.025)
Urobilinogen, UA: 0.2 U/dL
pH, UA: 5.5 (ref 5.0–8.0)

## 2024-05-17 MED ORDER — TIZANIDINE HCL 6 MG PO CAPS
6.0000 mg | ORAL_CAPSULE | Freq: Two times a day (BID) | ORAL | 0 refills | Status: AC | PRN
Start: 2024-05-17 — End: ?

## 2024-05-17 MED ORDER — ALUMINUM-MAGNESIUM-SIMETHICONE 200-200-20 MG/5ML PO SUSP: 30.0000 mL | ORAL | 0 refills | Status: AC | PRN

## 2024-05-17 MED ORDER — METRONIDAZOLE 0.75 % VA GEL: 1.0000 | Freq: Every evening | VAGINAL | 0 refills | Status: AC

## 2024-05-17 NOTE — Discharge Instructions (Addendum)
 Try the new muscle relaxer Zanaflex . Take only at bedtime if this makes you drowsy. Continue tylenol .  I wonder if the new medication atenolol is contributing to the difficulty swallowing. Please call your rheumatologist to discus this. You can try the maalox every 4 hours. If at any point you are unable to tolerate your own secretions or drink water , or have trouble breathing, go to the emergency department.  I am treating your for BV with the metrogel . We will call you if anything on your swab returns positive. You can also see these results on MyChart. Please abstain from sexual intercourse until your results return.

## 2024-05-17 NOTE — ED Triage Notes (Signed)
 Patient presenting with neck and back pain onset 1 week ago. No known falls or accidents. Denies history of chronic pain. States the pain is random.  Patient presenting with vaginal discharge onset 3 days ago. Also having frequent urination.   Prescriptions or OTC medications tried: Yes- Ibuprofen , Tylenol  arthritis    with little relief

## 2024-05-17 NOTE — ED Provider Notes (Signed)
 [facility]    CSN: [id number] Arrival date & time: 05/17/24  1204      History   Chief Complaint Chief Complaint  Patient presents with   Vaginal Discharge   Neck Pain    HPI Kayla [name] is a 33 y.o. female.  1 week history of neck and upper back pain. Denies injury or trauma, heavy lifting. Rating 8/10 with movement.  She does have history of lupus and occasionally has arthralgias, also several year history of fibromyalgia. She is followed by rheumatology, last seen 17 days ago. She was started on atenolol daily for nerves.  She has developed trouble swallowing over the last few days - reports discomfort in the throat, not pain. She is able to tolerate secretions, just feels like something is in the way. She was also newly diagnosed with hyperthyroidism, sees endocrine initial visit next month.  Has tried ibuprofen  and tylenol  for the pain. She takes 5 mg prednisone  daily, and hydroxychloroquine  100 mg daily  Also 3 day history of vaginal discharge with fishy odor, and frequent urination. No dysuria, flank or abd pain, fever Reports recurrent BV infections  Past Medical History:  Diagnosis Date   Acute respiratory failure with hypoxemia (HCC) 09/19/2017   Acute respiratory failure with hypoxia (HCC)    Arthritis    hands and legs (01/08/2015)   CAP (community acquired pneumonia) 01/07/2015   Chronic Respiratory failure with oxygen requirement affecting pregnancy, antepartum 05/16/2016   Resolved pulmonary HTN on 2018 echo (in our epic).   Pt off O2 in 2018.   Daily headache    sometimes (01/08/2015)   Depression    2017   Gastroesophageal reflux disease without esophagitis 07/13/2020   GERD (gastroesophageal reflux disease)    Hypertension    No longer takes meds   ILD (interstitial lung disease) (HCC)    Insomnia 08/21/2015   Loud P2 (pulmonary S2, second heart sound) 08/21/2015   Lung disease    Lupus    Lupus (systemic lupus erythematosus)  (HCC) 08/21/2015   Multifocal pneumonia    Pulmonary hypertension (HCC)    Seasonal allergies 03/02/2023   Sjogren's syndrome (HCC)    SS-A antibody positive 06/02/2016   SS-B antibody positive 06/02/2016   STD (sexually transmitted disease)    Chlamydia   Vaginal Pap smear, abnormal    repeats have been ok   Viral illness 03/02/2023    Patient Active Problem List   Diagnosis Date Noted   Severe pre-eclampsia 08/24/2023   SVD (spontaneous vaginal delivery) 08/22/2023   Long term (current) use of systemic steroids 07/26/2023   Thrombocytopenia affecting pregnancy (HCC) 06/23/2023   Obesity affecting pregnancy 04/14/2023   Alpha thalassemia silent carrier 03/11/2023   Supervision of high risk pregnancy, antepartum, RED CHART patient 02/16/2023   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)    History of IUFD 02/14/2017   Chronic hypertension 03/14/2016   Systemic lupus erythematosus (SLE) affecting pregnancy, antepartum (HCC) 07/06/2015    Past Surgical History:  Procedure Laterality Date   CARDIAC CATHETERIZATION N/A 09/09/2015   Procedure: Right Heart Cath;  Surgeon: Ezra GORMAN Shuck, MD;  Location: [facility] INVASIVE CV LAB;  Service: Cardiovascular;  Laterality: N/A;   DILATION AND EVACUATION N/A 07/13/2015   Procedure: DILATATION AND EVACUATION;  Surgeon: Lang JINNY Peel, DO;  Location: [facility]S;  Service: Gynecology;  Laterality: N/A;   FINGER SURGERY Right 03/2014   laceration, nerve/artery injury 2nd digit   VIDEO BRONCHOSCOPY Bilateral 01/12/2015  Procedure: VIDEO BRONCHOSCOPY WITH FLUORO;  Surgeon: Lamar GORMAN Chris, MD;  Location: [facility] ENDOSCOPY;  Service: Cardiopulmonary;  Laterality: Bilateral;    OB History     Gravida  5   Para  3   Term  2   Preterm  1   AB  2   Living  2      SAB  2   IAB  0   Ectopic  0   Multiple  0   Live Births  2            Home Medications    Prior to Admission medications   Medication Sig Start Date End  Date Taking? Authorizing Provider  acetaminophen  (TYLENOL ) 500 MG tablet Take 1,000 mg by mouth every 6 (six) hours as needed.   Yes [provider]  albuterol  (VENTOLIN  HFA) 108 (90 Base) MCG/ACT inhaler Inhale 2 puffs into the lungs every 6 (six) hours as needed. 09/26/23  Yes [provider]  aluminum-magnesium  hydroxide-simethicone  (MAALOX) 200-200-20 MG/5ML SUSP Take 30 mLs by mouth every 4 (four) hours as needed. 05/17/24  Yes Raylinn Kosar, [place], PA-C  ASPIRIN  LOW DOSE 81 MG tablet Take 81 mg by mouth daily. 09/26/23  Yes [provider]  azaTHIOprine  (IMURAN ) 50 MG tablet Take 150 mg by mouth. 09/04/23 09/03/24 Yes [provider]  cetirizine (ZYRTEC) 10 MG tablet Take 10 mg by mouth. 03/03/24 03/03/25 Yes [provider]  esomeprazole (NEXIUM) 40 MG capsule Take 40 mg by mouth daily at 12 noon. 09/04/23 09/03/24 Yes [provider]  fluticasone  (FLONASE ) 50 MCG/ACT nasal spray Place 1 spray into the nose. 03/03/24 03/03/25 Yes [provider]  hydroxychloroquine  (PLAQUENIL ) 200 MG tablet Take 200 mg by mouth 2 (two) times daily.   Yes [provider]  ibuprofen  (ADVIL ) 800 MG tablet Take 1 tablet (800 mg total) by mouth 3 (three) times daily. 04/17/24  Yes Raspet, Erin K, PA-C  ipratropium (ATROVENT  HFA) 17 MCG/ACT inhaler Inhale 2 puffs into the lungs every 6 (six) hours as needed for wheezing. 05/04/23  Yes Lola Donnice HERO, MD  metroNIDAZOLE  (METROGEL ) 0.75 % vaginal gel Place 1 Applicatorful vaginally at bedtime for 7 days. 05/17/24 05/24/24 Yes Khye Hochstetler, [place], PA-C  ondansetron  (ZOFRAN -ODT) 4 MG disintegrating tablet Take 1 tablet (4 mg total) by mouth 2 (two) times daily as needed for nausea. 01/16/24  Yes Prentiss Riggs A, NP  oxyCODONE -acetaminophen  (PERCOCET) 10-325 MG tablet Take 1 tablet by mouth 3 (three) times daily as needed. 08/29/23  Yes [provider]  pantoprazole  (PROTONIX ) 40 MG tablet Take 1 tablet (40  mg total) by mouth 2 (two) times daily before a meal. 04/11/23  Yes Lola Donnice HERO, MD  predniSONE  (DELTASONE ) 5 MG tablet Take 5 mg by mouth daily with breakfast.   Yes [provider]  Prenatal 28-0.8 MG TABS Take 1 tablet by mouth daily. 02/23/23  Yes Eveline Lynwood MATSU, MD  tizanidine  (ZANAFLEX ) 6 MG capsule Take 1 capsule (6 mg total) by mouth 2 (two) times daily as needed. 05/17/24  Yes Vaiden Adames, PA-C  Vitamin D, Ergocalciferol, (DRISDOL) 1.25 MG (50000 UNIT) CAPS capsule Take 50,000 Units by mouth once a week. 11/30/23  Yes [provider]  amLODipine  (NORVASC ) 5 MG tablet Take 1 tablet (5 mg total) by mouth daily. 09/22/17 11/09/20  Rosan Deward ORN, NP    Family History Family History  Problem Relation Age of Onset   Hypertension Mother    Deep vein thrombosis Mother  Arthritis Father    Cancer Brother        Found in jaw area   Diabetes Maternal Aunt    Stroke Maternal Grandmother    Hypertension Maternal Grandmother    Diabetes Maternal Grandmother    Hypertension Maternal Grandfather     Social History Social History   Tobacco Use   Smoking status: Former    Current packs/day: 0.00    Average packs/day: 0.1 packs/day for 5.0 years (0.5 ttl pk-yrs)    Types: Cigarettes    Start date: 11/20/2009    Quit date: 11/20/2014    Years since quitting: 9.4   Smokeless tobacco: Never  Vaping Use   Vaping status: Never Used  Substance Use Topics   Alcohol use: Not Currently    Comment: Occas   Drug use: No     Allergies   Hydrocodone  and Zithromax  [azithromycin ]   Review of Systems Review of Systems As per HPI  Physical Exam Triage Vital Signs ED Triage Vitals  Encounter Vitals Group     BP 05/17/24 1225 112/79     Girls Systolic BP Percentile --      Girls Diastolic BP Percentile --      Boys Systolic BP Percentile --      Boys Diastolic BP Percentile --      Pulse Rate 05/17/24 1225 73     Resp 05/17/24 1225 18     Temp 05/17/24  1225 98.1 F (36.7 C)     Temp Source 05/17/24 1225 Oral     SpO2 05/17/24 1225 96 %     Weight 05/17/24 1225 170 lb (77.1 kg)     Height 05/17/24 1225 5' 3 (1.6 m)     Head Circumference --      Peak Flow --      Pain Score 05/17/24 1222 8     Pain Loc --      Pain Education --      Exclude from Growth Chart --    No data found.  Updated Vital Signs BP 112/79 (BP Location: Left Arm)   Pulse 73   Temp 98.1 F (36.7 C) (Oral)   Resp 18   Ht 5' 3 (1.6 m)   Wt 170 lb (77.1 kg)   LMP 05/09/2024 (Approximate)   SpO2 96%   Breastfeeding No   BMI 30.11 kg/m   Physical Exam Vitals and nursing note reviewed.  Constitutional:      General: She is not in acute distress.    Appearance: She is not ill-appearing or diaphoretic.  HENT:     Mouth/Throat:     Mouth: Mucous membranes are moist.     Pharynx: Oropharynx is clear. Uvula midline. No pharyngeal swelling, oropharyngeal exudate, posterior oropharyngeal erythema or uvula swelling.     Tonsils: No tonsillar exudate or tonsillar abscesses. 1+ on the right. 1+ on the left.   Eyes:     Extraocular Movements: Extraocular movements intact.     Conjunctiva/sclera: Conjunctivae normal.     Pupils: Pupils are equal, round, and reactive to light.   Neck:     Thyroid: No thyroid mass, thyromegaly or thyroid tenderness.     Trachea: Trachea and phonation normal.      Comments: Left side neck muscular tenderness. No bony tenderness of C spine. Full ROM of neck No thyromegaly or mass. Normal phonation, tolerating secretions. Airway patent  Cardiovascular:     Rate and Rhythm: Normal rate and regular rhythm.  Pulses: Normal pulses.     Heart sounds: Normal heart sounds.  Pulmonary:     Effort: Pulmonary effort is normal.     Breath sounds: Normal breath sounds.  Abdominal:     Tenderness: There is no abdominal tenderness. There is no right CVA tenderness, left CVA tenderness or guarding.   Musculoskeletal:        General:  Normal range of motion.     Cervical back: Normal range of motion. No rigidity or tenderness. Muscular tenderness present. No spinous process tenderness. Normal range of motion.  Lymphadenopathy:     Cervical: No cervical adenopathy.   Skin:    General: Skin is warm and dry.   Neurological:     General: No focal deficit present.     Mental Status: She is alert and oriented to person, place, and time.     Cranial Nerves: Cranial nerves 2-12 are intact. No cranial nerve deficit.     Sensory: Sensation is intact.     Motor: Motor function is intact. No weakness.     Coordination: Coordination is intact.     Gait: Gait is intact.     Comments: Strength 5/5. Sensation intact throughout     UC Treatments / Results  Labs (all labs ordered are listed, but only abnormal results are displayed) Labs Reviewed  POCT URINALYSIS DIP (MANUAL ENTRY) - Abnormal; Notable for the following components:      Result Value   Color, UA orange (*)    Bilirubin, UA small (*)    Ketones, POC UA trace (5) (*)    Spec Grav, UA >=1.030 (*)    Protein Ur, POC =100 (*)    All other components within normal limits  CERVICOVAGINAL ANCILLARY ONLY    EKG  Radiology No results found.  Procedures Procedures (including critical care time)  Medications Ordered in UC Medications - No data to display  Initial Impression / Assessment and Plan / UC Course  I have reviewed the triage vital signs and the nursing notes.  Pertinent labs & imaging results that were available during my care of the patient were reviewed by me and considered in my medical decision making (see chart for details).  Afebrile, stable vitals, well appearing  Neck and back pain likely muscular. Neuralgically intact, no injury or trauma to warrant imaging. Suspect related to fibromyalgia dx along with lupus (may be overlapping conditions). She is already taking prednisone  5 mg daily and hydroxychloroquine .  I have recommended muscle  relaxer, tylenol , and following with rheumatology.  Dysphagia could be related to thyroid or possibly the new atenolol prescription.  No red flags.  She is tolerating secretions and has normal phonation, airways patent.  I have recommended she contact her rheumatologist today and discuss discontinuing the atenolol.  Can also try Maalox every 4 hours.  Strict ED precautions are verbalized for acute worsening of symptoms. Will follow with endocrine as well.  UA without sign of infection. Elevated spec grav and small protein Cytology swab pending.  She has history of recurrent BV and her symptoms are very similar with past presentations.  She would like to be treated today.  Prefers MetroGel .  Final Clinical Impressions(s) / UC Diagnoses   Final diagnoses:  Neck pain  Upper back pain  Dysphagia, unspecified type  Vaginal odor  BV (bacterial vaginosis)  Fibromyalgia  Other forms of systemic lupus erythematosus, unspecified organ involvement status [facility])     Discharge Instructions      Try the  new muscle relaxer Zanaflex . Take only at bedtime if this makes you drowsy. Continue tylenol .  I wonder if the new medication atenolol is contributing to the difficulty swallowing. Please call your rheumatologist to discus this. You can try the maalox every 4 hours. If at any point you are unable to tolerate your own secretions or drink water , or have trouble breathing, go to the emergency department.  I am treating your for BV with the metrogel . We will call you if anything on your swab returns positive. You can also see these results on MyChart. Please abstain from sexual intercourse until your results return.     ED Prescriptions     Medication Sig Dispense Auth. Provider   aluminum-magnesium  hydroxide-simethicone  (MAALOX) 200-200-20 MG/5ML SUSP Take 30 mLs by mouth every 4 (four) hours as needed. 355 mL Krystina Strieter, PA-C   metroNIDAZOLE  (METROGEL ) 0.75 % vaginal gel Place 1  Applicatorful vaginally at bedtime for 7 days. 70 g Consuello Lassalle, PA-C   tizanidine  (ZANAFLEX ) 6 MG capsule Take 1 capsule (6 mg total) by mouth 2 (two) times daily as needed. 20 capsule Quay Simkin, [place], PA-C      PDMP not reviewed this encounter.   Saul Dorsi, [place], [place] 05/17/24 1442

## 2024-05-21 ENCOUNTER — Ambulatory Visit (HOSPITAL_COMMUNITY): Payer: Self-pay

## 2024-05-21 ENCOUNTER — Telehealth (HOSPITAL_COMMUNITY): Payer: Self-pay

## 2024-05-21 LAB — CERVICOVAGINAL ANCILLARY ONLY
Bacterial Vaginitis (gardnerella): POSITIVE — AB
Candida Glabrata: NEGATIVE
Candida Vaginitis: POSITIVE — AB
Chlamydia: NEGATIVE
Comment: NEGATIVE
Comment: NEGATIVE
Comment: NEGATIVE
Comment: NEGATIVE
Comment: NEGATIVE
Comment: NORMAL
Neisseria Gonorrhea: NEGATIVE
Trichomonas: NEGATIVE

## 2024-05-21 MED ORDER — FLUCONAZOLE 150 MG PO TABS: 150.0000 mg | ORAL_TABLET | Freq: Once | ORAL | 0 refills | Status: AC

## 2024-05-21 MED ORDER — TIZANIDINE HCL 2 MG PO TABS: 6.0000 mg | ORAL_TABLET | Freq: Two times a day (BID) | ORAL | 0 refills | Status: AC | PRN

## 2024-05-21 NOTE — Telephone Encounter (Signed)
 Pt report pharmacy states Zanaflex  was not available as 6mg  capsules; needed tablets. Per L. Morgan, PA-C,  change the rx to 2 mg tablets and she can take three at a time. Rx sent to pharmacy on file.

## 2024-06-05 ENCOUNTER — Emergency Department (HOSPITAL_COMMUNITY)

## 2024-06-05 ENCOUNTER — Other Ambulatory Visit: Payer: Self-pay

## 2024-06-05 ENCOUNTER — Emergency Department (HOSPITAL_COMMUNITY)
Admission: EM | Admit: 2024-06-05 | Discharge: 2024-06-05 | Disposition: A | Discharge status: Home / Self Care | Attending: Emergency Medicine | Admitting: Emergency Medicine

## 2024-06-05 DIAGNOSIS — Z7982 Long term (current) use of aspirin: Secondary | ICD-10-CM | POA: Insufficient documentation

## 2024-06-05 DIAGNOSIS — I10 Essential (primary) hypertension: Secondary | ICD-10-CM | POA: Insufficient documentation

## 2024-06-05 DIAGNOSIS — Z79899 Other long term (current) drug therapy: Secondary | ICD-10-CM | POA: Insufficient documentation

## 2024-06-05 DIAGNOSIS — R0789 Other chest pain: Secondary | ICD-10-CM | POA: Diagnosis not present

## 2024-06-05 DIAGNOSIS — Z5329 Procedure and treatment not carried out because of patient's decision for other reasons: Secondary | ICD-10-CM | POA: Insufficient documentation

## 2024-06-05 DIAGNOSIS — R079 Chest pain, unspecified: Secondary | ICD-10-CM | POA: Diagnosis present

## 2024-06-05 LAB — CBC
HCT: 41.4 % (ref 36.0–46.0)
Hemoglobin: 13.2 g/dL (ref 12.0–15.0)
MCH: 27.7 pg (ref 26.0–34.0)
MCHC: 31.9 g/dL (ref 30.0–36.0)
MCV: 87 fL (ref 80.0–100.0)
Platelets: 197 K/uL (ref 150–400)
RBC: 4.76 MIL/uL (ref 3.87–5.11)
RDW: 11.9 % (ref 11.5–15.5)
WBC: 6.8 K/uL (ref 4.0–10.5)
nRBC: 0 % (ref 0.0–0.2)

## 2024-06-05 LAB — HCG, SERUM, QUALITATIVE: Preg, Serum: NEGATIVE

## 2024-06-05 LAB — TROPONIN I (HIGH SENSITIVITY): Troponin I (High Sensitivity): <2 ng/L (ref <18)

## 2024-06-05 LAB — BASIC METABOLIC PANEL WITH GFR
Anion gap: 9 (ref 5–15)
BUN: 10 mg/dL (ref 6–20)
CO2: 27 mmol/L (ref 22–32)
Calcium: 8.7 mg/dL — ABNORMAL LOW (ref 8.9–10.3)
Chloride: 103 mmol/L (ref 98–111)
Creatinine, Ser: 0.66 mg/dL (ref 0.44–1.00)
GFR, Estimated: >60 mL/min (ref >60)
Glucose, Bld: 97 mg/dL (ref 70–99)
Potassium: 3.4 mmol/L — ABNORMAL LOW (ref 3.5–5.1)
Sodium: 139 mmol/L (ref 135–145)

## 2024-06-05 LAB — D-DIMER, QUANTITATIVE: D-Dimer, Quant: 0.52 ug{FEU}/mL — ABNORMAL HIGH (ref 0.00–0.50)

## 2024-06-05 MED ORDER — ALUM & MAG HYDROXIDE-SIMETH 200-200-20 MG/5ML PO SUSP: 30.0000 mL | Freq: Once | ORAL | Status: DC

## 2024-06-05 NOTE — ED Notes (Signed)
 Patient stated to me while assessing her that she had to leave due to picking up child. Risks of leaving up to and including death were explained. Provider aware, unable to get signature to capture on AMA form.

## 2024-06-05 NOTE — ED Provider Triage Note (Cosign Needed)
 Emergency Medicine Provider Triage Evaluation Note  Kayla [name] , a 33 y.o. female  was evaluated in triage.  Pt complains of chest pain to the left side to the left shoulder and some down the arm.  Has been intermittent for the past 3 days but reports has been more constant since last night.  She is unsure of any palpitations or shortness of breath.  Denies any cough symptoms.  Has had a little bit of nasal congestion but no fevers.  Denies any leg swelling.  Denies any history of DVT/PE.  She does have a history of lupus.  Review of Systems  Positive:  Negative:   Physical Exam  BP 119/85 (BP Location: Left Arm)   Pulse (!) 110   Temp 98.2 F (36.8 C) (Oral)   Resp (!) 27   Ht 5' 3 (1.6 m)   Wt 77.1 kg   LMP 06/05/2024 (Approximate)   SpO2 100%   BMI 30.11 kg/m  Gen:   Awake, no distress   Resp:  Normal effort  MSK:   Moves extremities without difficulty  Other:  Patient does have some tachycardia however does appear slightly anxious.  Palpable pulses distally that are symmetric and equal.  Medical Decision Making  Medically screening exam initiated at 12:17 PM.  Appropriate orders placed.  Kayla [name] was informed that the remainder of the evaluation will be completed by another provider, this initial triage assessment does not replace that evaluation, and the importance of remaining in the ED until their evaluation is complete.  Patient tachycardic, respiratory rate charted is 27 however patient does not look tachypneic in the room and is speaking in full sentences.  Given the tachycardia as well as history of lupus, have ordered a D-dimer for concern for PE   Kayla [name], Kayla [name] 06/05/24 1218

## 2024-06-05 NOTE — ED Provider Notes (Signed)
 [place] [facility]MENT AT [facility] Provider Note   CSN: [id number] Arrival date & time: 06/05/24  1014     Patient presents with: Chest Pain   Kayla [name] is a 33 y.o. female with history of GERD, lupus, hypertension, Sjogren's syndrome presents to the ED with complaints of 3 days of chest pain.  Pain is worse with reclining.  She has no exertional symptoms.  Does not feel short of breath or dizzy.  She has no cardiac history or prior blood clots.  She has no risk factors for VTE.  No URI symptoms.  Pain is localized to the left side of her chest and radiates up to her upper trap.    Chest Pain     Past Medical History:  Diagnosis Date   Acute respiratory failure with hypoxemia (HCC) 09/19/2017   Acute respiratory failure with hypoxia (HCC)    Arthritis    hands and legs (01/08/2015)   CAP (community acquired pneumonia) 01/07/2015   Chronic Respiratory failure with oxygen requirement affecting pregnancy, antepartum 05/16/2016   Resolved pulmonary HTN on 2018 echo (in our epic).   Pt off O2 in 2018.   Daily headache    sometimes (01/08/2015)   Depression    2017   Gastroesophageal reflux disease without esophagitis 07/13/2020   GERD (gastroesophageal reflux disease)    Hypertension    No longer takes meds   ILD (interstitial lung disease) (HCC)    Insomnia 08/21/2015   Loud P2 (pulmonary S2, second heart sound) 08/21/2015   Lung disease    Lupus    Lupus (systemic lupus erythematosus) (HCC) 08/21/2015   Multifocal pneumonia    Pulmonary hypertension (HCC)    Seasonal allergies 03/02/2023   Sjogren's syndrome (HCC)    SS-A antibody positive 06/02/2016   SS-B antibody positive 06/02/2016   STD (sexually transmitted disease)    Chlamydia   Vaginal Pap smear, abnormal    repeats have been ok   Viral illness 03/02/2023     Prior to Admission medications   Medication Sig Start Date End Date Taking? Authorizing Provider  acetaminophen   (TYLENOL ) 500 MG tablet Take 1,000 mg by mouth every 6 (six) hours as needed.    [provider]  albuterol  (VENTOLIN  HFA) 108 (90 Base) MCG/ACT inhaler Inhale 2 puffs into the lungs every 6 (six) hours as needed. 09/26/23   [provider]  aluminum -magnesium  hydroxide-simethicone  (MAALOX) 200-200-20 MG/5ML SUSP Take 30 mLs by mouth every 4 (four) hours as needed. 05/17/24   Rising, Asberry, PA-C  ASPIRIN  LOW DOSE 81 MG tablet Take 81 mg by mouth daily. 09/26/23   [provider]  azaTHIOprine  (IMURAN ) 50 MG tablet Take 150 mg by mouth. 09/04/23 09/03/24  [provider]  cetirizine (ZYRTEC) 10 MG tablet Take 10 mg by mouth. 03/03/24 03/03/25  [provider]  esomeprazole (NEXIUM) 40 MG capsule Take 40 mg by mouth daily at 12 noon. 09/04/23 09/03/24  [provider]  fluticasone  (FLONASE ) 50 MCG/ACT nasal spray Place 1 spray into the nose. 03/03/24 03/03/25  [provider]  hydroxychloroquine  (PLAQUENIL ) 200 MG tablet Take 200 mg by mouth 2 (two) times daily.    [provider]  ibuprofen  (ADVIL ) 800 MG tablet Take 1 tablet (800 mg total) by mouth 3 (three) times daily. 04/17/24   Raspet, Erin K, PA-C  ipratropium (ATROVENT  HFA) 17 MCG/ACT inhaler Inhale 2 puffs into the lungs every 6 (six) hours as needed for wheezing. 05/04/23   Lola,  Donnice HERO, MD  ondansetron  (ZOFRAN -ODT) 4 MG disintegrating tablet Take 1 tablet (4 mg total) by mouth 2 (two) times daily as needed for nausea. 01/16/24   Prentiss Annabella LABOR, NP  oxyCODONE -acetaminophen  (PERCOCET) 10-325 MG tablet Take 1 tablet by mouth 3 (three) times daily as needed. 08/29/23   [provider]  pantoprazole  (PROTONIX ) 40 MG tablet Take 1 tablet (40 mg total) by mouth 2 (two) times daily before a meal. 04/11/23   Lola Donnice HERO, MD  predniSONE  (DELTASONE ) 5 MG tablet Take 5 mg by mouth daily with breakfast.    [provider]  Prenatal 28-0.8 MG TABS Take 1 tablet  by mouth daily. 02/23/23   Eveline Lynwood MATSU, MD  tizanidine  (ZANAFLEX ) 6 MG capsule Take 1 capsule (6 mg total) by mouth 2 (two) times daily as needed. 05/17/24   Rising, Rebecca, PA-C  Vitamin D, Ergocalciferol, (DRISDOL) 1.25 MG (50000 UNIT) CAPS capsule Take 50,000 Units by mouth once a week. 11/30/23   [provider]  amLODipine  (NORVASC ) 5 MG tablet Take 1 tablet (5 mg total) by mouth daily. 09/22/17 11/09/20  Rosan Deward ORN, NP    Allergies: Hydrocodone  and Zithromax  [azithromycin ]    Review of Systems  Cardiovascular:  Positive for chest pain.    Updated Vital Signs BP 124/77 (BP Location: Right Arm)   Pulse 95   Temp 98.4 F (36.9 C)   Resp 16   Ht 5' 3 (1.6 m)   Wt 77.1 kg   LMP 06/05/2024 (Approximate)   SpO2 96%   BMI 30.11 kg/m   Physical Exam Vitals and nursing note reviewed.  Constitutional:      General: She is not in acute distress.    Appearance: She is well-developed.  HENT:     Head: Normocephalic and atraumatic.  Eyes:     Conjunctiva/sclera: Conjunctivae normal.  Cardiovascular:     Rate and Rhythm: Normal rate and regular rhythm.     Heart sounds: No murmur heard. Pulmonary:     Effort: Pulmonary effort is normal. No respiratory distress.     Breath sounds: Normal breath sounds.  Chest:     Chest wall: Tenderness present.  Abdominal:     Palpations: Abdomen is soft.     Tenderness: There is no abdominal tenderness.  Musculoskeletal:        General: No swelling.     Cervical back: Neck supple.  Skin:    General: Skin is warm and dry.     Capillary Refill: Capillary refill takes less than 2 seconds.  Neurological:     Mental Status: She is alert.  Psychiatric:        Mood and Affect: Mood normal.     (all labs ordered are listed, but only abnormal results are displayed) Labs Reviewed  BASIC METABOLIC PANEL WITH GFR - Abnormal; Notable for the following components:      Result Value   Potassium 3.4 (*)    Calcium  8.7 (*)    All  other components within normal limits  D-DIMER, QUANTITATIVE - Abnormal; Notable for the following components:   D-Dimer, Quant 0.52 (*)    All other components within normal limits  CBC  HCG, SERUM, QUALITATIVE  TROPONIN I (HIGH SENSITIVITY)  TROPONIN I (HIGH SENSITIVITY)    EKG: None  Radiology: DG Chest 2 View Result Date: 06/05/2024 CLINICAL DATA:  CP EXAM: CHEST - 2 VIEW COMPARISON:  None available. FINDINGS: Redemonstrated perihilar scarring noted in both upper lung zones. No  focal airspace consolidation, pleural effusion, or pneumothorax. No cardiomegaly. No acute fracture or destructive lesion. IMPRESSION: No acute cardiopulmonary abnormality. Electronically Signed   By: Rogelia Myers M.D.   On: 06/05/2024 12:57     Procedures   Medications Ordered in the ED  alum & mag hydroxide-simeth (MAALOX/MYLANTA) 200-200-20 MG/5ML suspension 30 mL (has no administration in time range)                                    Medical Decision Making  This patient presents to the ED with chief complaint(s) of chest pain.  The complaint involves an extensive differential diagnosis and also carries with it a high risk of complications and morbidity.   Pertinent past medical history as listed in HPI  The differential diagnosis includes  ACS, PE, aortic dissection, GERD, pneumothorax, pneumonia, URI Additional history obtained: Records reviewed Care Everywhere/External Records  Assessment and management:   Patient presented tachycardic to 110 otherwise hemodynamically stable with complaints of chest pain x 3 days.  Is worse with reclining.  She has no exertional symptoms.  No cough or URI symptoms.  Her lung sounds are clear.  Chest x-ray is without any cardio or pulmonary disease.  Low suspicion for dissection as she is sitting comfortably, normotensive with symmetric vitals and no neurodeficits.  Tachycardia has improved.  Her lab work is so far reassuring, although she does have a mildly  elevated dimer.  Appears that it has been elevated in the past with negative PE studies.  Given current symptoms, tachycardia upon presentation and mildly elevated dimer we will obtain PE study.  She does report symptoms feel similar to GERD, will trial Maalox/Mylanta during visit.  Independent ECG interpretation:  Sinus tachycardia, prolonged QT, nonspecific T wave abnormality  Independent labs interpretation:  The following labs were independently interpreted:  CBC unremarkable, BMP without significant abnormality, hCG negative, troponin without elevation, dimer mildly elevated to 0.52  Independent visualization and interpretation of imaging: I independently visualized the following imaging with scope of interpretation limited to determining acute life threatening conditions related to emergency care: Chest x-ray without any cardiopulmonary disease   Consultations obtained:   none  Disposition:   Patient left AMA prior to second Trop and CT scan  Social Determinants of Health:   none  This note was dictated with voice recognition software.  Despite best efforts at proofreading, errors may have occurred which can change the documentation meaning.       Final diagnoses:  Atypical chest pain    ED Discharge Orders     None          Donnajean Lynwood VEAR [name] 06/05/24 1652    Jerrol Lynwood, MD 06/05/24 2200

## 2024-06-05 NOTE — ED Triage Notes (Signed)
 Pt having central chest pressure radiating to left arm. Also c/o SOB. Started 3 days ago and no otc pain meds relieve it. Nothing makes it worse or better.

## 2024-06-06 ENCOUNTER — Emergency Department (HOSPITAL_COMMUNITY)
Admission: EM | Admit: 2024-06-06 | Discharge: 2024-06-06 | Discharge status: Against Medical Advice | Attending: Student | Admitting: Student

## 2024-06-06 ENCOUNTER — Ambulatory Visit: Admitting: Obstetrics and Gynecology

## 2024-06-06 DIAGNOSIS — R9431 Abnormal electrocardiogram [ECG] [EKG]: Secondary | ICD-10-CM | POA: Diagnosis not present

## 2024-06-06 DIAGNOSIS — R079 Chest pain, unspecified: Secondary | ICD-10-CM | POA: Diagnosis present

## 2024-06-06 DIAGNOSIS — M549 Dorsalgia, unspecified: Secondary | ICD-10-CM | POA: Diagnosis not present

## 2024-06-06 DIAGNOSIS — R791 Abnormal coagulation profile: Secondary | ICD-10-CM | POA: Diagnosis not present

## 2024-06-06 DIAGNOSIS — Z5321 Procedure and treatment not carried out due to patient leaving prior to being seen by health care provider: Secondary | ICD-10-CM | POA: Insufficient documentation

## 2024-06-06 DIAGNOSIS — M329 Systemic lupus erythematosus, unspecified: Secondary | ICD-10-CM | POA: Diagnosis not present

## 2024-06-06 DIAGNOSIS — M25519 Pain in unspecified shoulder: Secondary | ICD-10-CM | POA: Insufficient documentation

## 2024-06-06 LAB — CBC WITH DIFFERENTIAL/PLATELET
Abs Immature Granulocytes: 0.02 K/uL (ref 0.00–0.07)
Basophils Absolute: 0 K/uL (ref 0.0–0.1)
Basophils Relative: 1 %
Eosinophils Absolute: 0.1 K/uL (ref 0.0–0.5)
Eosinophils Relative: 1 %
HCT: 40.9 % (ref 36.0–46.0)
Hemoglobin: 12.9 g/dL (ref 12.0–15.0)
Immature Granulocytes: 0 %
Lymphocytes Relative: 45 %
Lymphs Abs: 2.9 K/uL (ref 0.7–4.0)
MCH: 27.7 pg (ref 26.0–34.0)
MCHC: 31.5 g/dL (ref 30.0–36.0)
MCV: 88 fL (ref 80.0–100.0)
Monocytes Absolute: 0.5 K/uL (ref 0.1–1.0)
Monocytes Relative: 8 %
Neutro Abs: 2.8 K/uL (ref 1.7–7.7)
Neutrophils Relative %: 45 %
Platelets: 226 K/uL (ref 150–400)
RBC: 4.65 MIL/uL (ref 3.87–5.11)
RDW: 12.1 % (ref 11.5–15.5)
WBC: 6.3 K/uL (ref 4.0–10.5)
nRBC: 0 % (ref 0.0–0.2)

## 2024-06-06 LAB — COMPREHENSIVE METABOLIC PANEL WITH GFR
ALT: 14 U/L (ref 0–44)
AST: 15 U/L (ref 15–41)
Albumin: 3.6 g/dL (ref 3.5–5.0)
Alkaline Phosphatase: 49 U/L (ref 38–126)
Anion gap: 8 (ref 5–15)
BUN: 8 mg/dL (ref 6–20)
CO2: 27 mmol/L (ref 22–32)
Calcium: 8.7 mg/dL — ABNORMAL LOW (ref 8.9–10.3)
Chloride: 106 mmol/L (ref 98–111)
Creatinine, Ser: 0.65 mg/dL (ref 0.44–1.00)
GFR, Estimated: >60 mL/min (ref >60)
Glucose, Bld: 84 mg/dL (ref 70–99)
Potassium: 3.6 mmol/L (ref 3.5–5.1)
Sodium: 141 mmol/L (ref 135–145)
Total Bilirubin: 0.6 mg/dL (ref 0.0–1.2)
Total Protein: 6.5 g/dL (ref 6.5–8.1)

## 2024-06-06 LAB — TROPONIN I (HIGH SENSITIVITY): Troponin I (High Sensitivity): <2 ng/L (ref <18)

## 2024-06-06 LAB — TSH: TSH: 0.89 u[IU]/mL (ref 0.350–4.500)

## 2024-06-06 NOTE — ED Triage Notes (Signed)
 Patient reports she was here  yesterday but left AMA prior to repeat trop and CT scan.  Patient returns to complete that.

## 2024-06-06 NOTE — Progress Notes (Incomplete)
 33 y.o. [id number] female here for annual exam. Single.  Patient's last menstrual period was 06/05/2024 (approximate).    She reports ***.  Urine sample provided: ***  Abnormal bleeding: *** Pelvic discharge or pain: *** Breast mass, nipple discharge or skin changes : ***  Sexually active: ***  Birth control: *** Last PAP:     Component Value Date/Time   DIAGPAP  03/08/2022 1358    - Negative for intraepithelial lesion or malignancy (NILM)   ADEQPAP  03/08/2022 1358    Satisfactory for evaluation; transformation zone component ABSENT.   Gardasil: ***  Exercising: *** Smoker: ***  Flowsheet Row Initial Prenatal from 02/23/2023 in [facility]  PHQ-2 Total Score 3    Flowsheet Row Initial Prenatal from 02/23/2023 in [facility]  PHQ-9 Total Score 10     GYN HISTORY: ***  OB History  Gravida Para Term Preterm AB Living  5 3 2 1 2 2   SAB IAB Ectopic Multiple Live Births  2 0 0 0 2    # Outcome Date GA Lbr Len/2nd Weight Sex Type Anes PTL Lv  5 Term 08/19/23 [redacted]w[redacted]d / 00:06 6 lb 9.1 oz (2.98 kg) F Vag-Spont EPI  LIV  4 Term 09/13/17 [redacted]w[redacted]d 179:01 / 00:41 5 lb 13.7 oz (2.655 kg) F Vag-Spont EPI  LIV  3 Preterm 10/05/16   4 lb 15.4 oz (2.25 kg) F   Y FD  2 SAB           1 SAB            Past Medical History:  Diagnosis Date   Acute respiratory failure with hypoxemia (HCC) 09/19/2017   Acute respiratory failure with hypoxia (HCC)    Arthritis    hands and legs (01/08/2015)   CAP (community acquired pneumonia) 01/07/2015   Chronic Respiratory failure with oxygen requirement affecting pregnancy, antepartum 05/16/2016   Resolved pulmonary HTN on 2018 echo (in our epic).   Pt off O2 in 2018.   Daily headache    sometimes (01/08/2015)   Depression    2017   Gastroesophageal reflux disease without esophagitis 07/13/2020   GERD (gastroesophageal reflux disease)     Hypertension    No longer takes meds   ILD (interstitial lung disease) (HCC)    Insomnia 08/21/2015   Loud P2 (pulmonary S2, second heart sound) 08/21/2015   Lung disease    Lupus    Lupus (systemic lupus erythematosus) (HCC) 08/21/2015   Multifocal pneumonia    Pulmonary hypertension (HCC)    Seasonal allergies 03/02/2023   Sjogren's syndrome (HCC)    SS-A antibody positive 06/02/2016   SS-B antibody positive 06/02/2016   STD (sexually transmitted disease)    Chlamydia   Vaginal Pap smear, abnormal    repeats have been ok   Viral illness 03/02/2023   Past Surgical History:  Procedure Laterality Date   CARDIAC CATHETERIZATION N/A 09/09/2015   Procedure: Right Heart Cath;  Surgeon: Ezra GORMAN Shuck, MD;  Location: [facility] INVASIVE CV LAB;  Service: Cardiovascular;  Laterality: N/A;   DILATION AND EVACUATION N/A 07/13/2015   Procedure: DILATATION AND EVACUATION;  Surgeon: Lang JINNY Peel, DO;  Location: [facility] ORS;  Service: Gynecology;  Laterality: N/A;   FINGER SURGERY Right 03/2014   laceration, nerve/artery injury 2nd digit   VIDEO BRONCHOSCOPY Bilateral 01/12/2015   Procedure: VIDEO BRONCHOSCOPY WITH FLUORO;  Surgeon: Lamar GORMAN Chris, MD;  Location: [facility] ENDOSCOPY;  Service: Cardiopulmonary;  Laterality: Bilateral;   Current Outpatient Medications on File Prior to Visit  Medication Sig Dispense Refill   acetaminophen  (TYLENOL ) 500 MG tablet Take 1,000 mg by mouth every 6 (six) hours as needed.     albuterol  (VENTOLIN  HFA) 108 (90 Base) MCG/ACT inhaler Inhale 2 puffs into the lungs every 6 (six) hours as needed.     aluminum -magnesium  hydroxide-simethicone  (MAALOX) 200-200-20 MG/5ML SUSP Take 30 mLs by mouth every 4 (four) hours as needed. 355 mL 0   ASPIRIN  LOW DOSE 81 MG tablet Take 81 mg by mouth daily.     azaTHIOprine  (IMURAN ) 50 MG tablet Take 150 mg by mouth.     cetirizine (ZYRTEC) 10 MG tablet Take 10 mg by mouth.     esomeprazole (NEXIUM) 40 MG capsule Take 40 mg by mouth daily  at 12 noon.     fluticasone  (FLONASE ) 50 MCG/ACT nasal spray Place 1 spray into the nose.     hydroxychloroquine  (PLAQUENIL ) 200 MG tablet Take 200 mg by mouth 2 (two) times daily.     ibuprofen  (ADVIL ) 800 MG tablet Take 1 tablet (800 mg total) by mouth 3 (three) times daily. 21 tablet 0   ipratropium (ATROVENT  HFA) 17 MCG/ACT inhaler Inhale 2 puffs into the lungs every 6 (six) hours as needed for wheezing. 1 each 12   ondansetron  (ZOFRAN -ODT) 4 MG disintegrating tablet Take 1 tablet (4 mg total) by mouth 2 (two) times daily as needed for nausea. 14 tablet 0   oxyCODONE -acetaminophen  (PERCOCET) 10-325 MG tablet Take 1 tablet by mouth 3 (three) times daily as needed.     pantoprazole  (PROTONIX ) 40 MG tablet Take 1 tablet (40 mg total) by mouth 2 (two) times daily before a meal. 60 tablet 5   predniSONE  (DELTASONE ) 5 MG tablet Take 5 mg by mouth daily with breakfast.     Prenatal 28-0.8 MG TABS Take 1 tablet by mouth daily. 30 tablet 12   tizanidine  (ZANAFLEX ) 6 MG capsule Take 1 capsule (6 mg total) by mouth 2 (two) times daily as needed. 20 capsule 0   Vitamin D, Ergocalciferol, (DRISDOL) 1.25 MG (50000 UNIT) CAPS capsule Take 50,000 Units by mouth once a week.     [DISCONTINUED] amLODipine  (NORVASC ) 5 MG tablet Take 1 tablet (5 mg total) by mouth daily.     No current facility-administered medications on file prior to visit.   Social History   Socioeconomic History   Marital status: Single    Spouse name: Not on file   Number of children: Not on file   Years of education: Not on file   Highest education level: Not on file  Occupational History   Occupation: [employer]     Comment: Workers Compensation  Tobacco Use   Smoking status: Former    Current packs/day: 0.00    Average packs/day: 0.1 packs/day for 5.0 years (0.5 ttl pk-yrs)    Types: Cigarettes    Start date: 11/20/2009    Quit date: 11/20/2014    Years since quitting: 9.5   Smokeless tobacco: Never  Vaping Use   Vaping  status: Never Used  Substance and Sexual Activity   Alcohol use: Not Currently    Comment: Occas   Drug use: No   Sexual activity: Not Currently    Birth control/protection: None    Comment: First IC <15 y/o, Hx of CT+  Other Topics Concern   Not on file  Social History Narrative   Not on  file   Social Drivers of Health   Financial Resource Strain: Not on file  Food Insecurity: No Food Insecurity (04/22/2024)   Received from [facility]   Hunger Vital Sign    Within the past 12 months, you worried that your food would run out before you got the money to buy more.: Never true    Within the past 12 months, the food you bought just didn't last and you didn't have money to get more.: Never true  Transportation Needs: No Transportation Needs (04/22/2024)   Received from [facility] - Transportation    In the past 12 months, has lack of transportation kept you from medical appointments or from getting medications?: No    Lack of Transportation (Non-Medical): No  Physical Activity: Not on file  Stress: Not on file  Social Connections: Not on file  Intimate Partner Violence: Not At Risk (08/25/2023)   Humiliation, Afraid, Rape, and Kick questionnaire    Fear of Current or Ex-Partner: No    Emotionally Abused: No    Physically Abused: No    Sexually Abused: No   Family History  Problem Relation Age of Onset   Hypertension Mother    Deep vein thrombosis Mother    Arthritis Father    Cancer Brother        Found in jaw area   Diabetes Maternal Aunt    Stroke Maternal Grandmother    Hypertension Maternal Grandmother    Diabetes Maternal Grandmother    Hypertension Maternal Grandfather    Allergies  Allergen Reactions   Hydrocodone  Nausea And Vomiting   Zithromax  [Azithromycin ] Itching and Cough    PE There were no vitals filed for this visit. There is no height or weight on file to calculate BMI.  Physical Exam    Assessment and  Plan:        There are no diagnoses linked to this encounter. Clotilda FORBES Pa, CMA

## 2024-06-06 NOTE — ED Notes (Signed)
 Pt called and unable to be found in lobby

## 2024-06-06 NOTE — ED Provider Notes (Signed)
 Emergency Medicine Provider Triage Evaluation Note  Kayla [name] , a 33 y.o. female  was evaluated in triage.  Pt complains of chest pain, back pain, shoulder pain.  Was seen yesterday with negative cardiac workup but had a minimally elevated D-dimer.  Left AMA prior to CT PE and returns to have the scan done.  Does have a history of lupus.  Review of Systems  Positive: Chest pain, shoulder pain Negative: Abdominal pain, nausea, vomiting, diaphoresis, shortness of breath  Physical Exam  BP 120/78 (BP Location: Right Arm)   Pulse 97   Temp 98.6 F (37 C)   Resp 20   LMP 06/05/2024 (Approximate)   SpO2 100%  Gen:   Awake, no distress   Resp:  Normal effort  MSK:   Moves extremities without difficulty  Other:    Medical Decision Making  Medically screening exam initiated at 11:20 AM.  Appropriate orders placed.  Kayla [name] was informed that the remainder of the evaluation will be completed by another provider, this initial triage assessment does not replace that evaluation, and the importance of remaining in the ED until their evaluation is complete.     Albertina Dixon, MD 06/06/24 [phone]

## 2024-06-18 ENCOUNTER — Ambulatory Visit: Admitting: Internal Medicine

## 2024-06-18 NOTE — Progress Notes (Deleted)
 Name: Kayla [name]  MRN/ DOB: [id number], 1991/05/15    Age/ Sex: 33 y.o., female    PCP: Austin Mutton, MD   Reason for Endocrinology Evaluation: Low TSH     Date of Initial Endocrinology Evaluation: 06/18/2024     HPI: Ms. Kayla [name] is a 33 y.o. female with a past medical history of GERD, osteoarthritis and lupus. The patient presented for initial endocrinology clinic visit on 06/18/2024 for consultative assistance with her low TSH.   Patient has been noted with low TSH in 2018 at 0.345 u IU/mL and again in May, 2025 at 0.34 u IU/mL.  In May she presented to the ED with chest pain, no ACS, was thought to be related to GERD  TFTs normalized by July, 2025  Patient was evaluated by [facility] endocrinology 05/2024  She follows with rheumatology for systemic lupus erythematosus  HISTORY:  Past Medical History:  Past Medical History:  Diagnosis Date   Acute respiratory failure with hypoxemia (HCC) 09/19/2017   Acute respiratory failure with hypoxia (HCC)    Arthritis    hands and legs (01/08/2015)   CAP (community acquired pneumonia) 01/07/2015   Chronic Respiratory failure with oxygen requirement affecting pregnancy, antepartum 05/16/2016   Resolved pulmonary HTN on 2018 echo (in our epic).   Pt off O2 in 2018.   Daily headache    sometimes (01/08/2015)   Depression    2017   Gastroesophageal reflux disease without esophagitis 07/13/2020   GERD (gastroesophageal reflux disease)    Hypertension    No longer takes meds   ILD (interstitial lung disease) (HCC)    Insomnia 08/21/2015   Loud P2 (pulmonary S2, second heart sound) 08/21/2015   Lung disease    Lupus    Lupus (systemic lupus erythematosus) (HCC) 08/21/2015   Multifocal pneumonia    Pulmonary hypertension (HCC)    Seasonal allergies 03/02/2023   Sjogren's syndrome (HCC)    SS-A antibody positive 06/02/2016   SS-B antibody positive 06/02/2016   STD (sexually transmitted disease)    Chlamydia   Vaginal Pap  smear, abnormal    repeats have been ok   Viral illness 03/02/2023   Past Surgical History:  Past Surgical History:  Procedure Laterality Date   CARDIAC CATHETERIZATION N/A 09/09/2015   Procedure: Right Heart Cath;  Surgeon: Ezra GORMAN Shuck, MD;  Location: [facility] INVASIVE CV LAB;  Service: Cardiovascular;  Laterality: N/A;   DILATION AND EVACUATION N/A 07/13/2015   Procedure: DILATATION AND EVACUATION;  Surgeon: Lang JINNY Peel, DO;  Location: [facility] ORS;  Service: Gynecology;  Laterality: N/A;   FINGER SURGERY Right 03/2014   laceration, nerve/artery injury 2nd digit   VIDEO BRONCHOSCOPY Bilateral 01/12/2015   Procedure: VIDEO BRONCHOSCOPY WITH FLUORO;  Surgeon: Lamar GORMAN Chris, MD;  Location: [facility] ENDOSCOPY;  Service: Cardiopulmonary;  Laterality: Bilateral;    Social History:  reports that she quit smoking about 9 years ago. Her smoking use included cigarettes. She started smoking about 14 years ago. She has a 0.5 pack-year smoking history. She has never used smokeless tobacco. She reports that she does not currently use alcohol. She reports that she does not use drugs. Family History: family history includes Arthritis in her father; Cancer in her brother; Deep vein thrombosis in her mother; Diabetes in her maternal aunt and maternal grandmother; Hypertension in her maternal grandfather, maternal grandmother, and mother; Stroke in her maternal grandmother.   HOME MEDICATIONS: Allergies as of 06/18/2024       Reactions  Hydrocodone  Nausea And Vomiting   Zithromax  [azithromycin ] Itching, Cough        Medication List        Accurate as of June 18, 2024  7:26 AM. If you have any questions, ask your nurse or doctor.          acetaminophen  500 MG tablet Commonly known as: TYLENOL  Take 1,000 mg by mouth every 6 (six) hours as needed.   albuterol  108 (90 Base) MCG/ACT inhaler Commonly known as: VENTOLIN  HFA Inhale 2 puffs into the lungs every 6 (six) hours as needed.    aluminum -magnesium  hydroxide-simethicone  200-200-20 MG/5ML Susp Commonly known as: MAALOX Take 30 mLs by mouth every 4 (four) hours as needed.   Aspirin  Low Dose 81 MG tablet Generic drug: aspirin  EC Take 81 mg by mouth daily.   azaTHIOprine  50 MG tablet Commonly known as: IMURAN  Take 150 mg by mouth.   cetirizine 10 MG tablet Commonly known as: ZYRTEC Take 10 mg by mouth.   esomeprazole 40 MG capsule Commonly known as: NEXIUM Take 40 mg by mouth daily at 12 noon.   fluticasone  50 MCG/ACT nasal spray Commonly known as: FLONASE  Place 1 spray into the nose.   hydroxychloroquine  200 MG tablet Commonly known as: PLAQUENIL  Take 200 mg by mouth 2 (two) times daily.   ibuprofen  800 MG tablet Commonly known as: ADVIL  Take 1 tablet (800 mg total) by mouth 3 (three) times daily.   ipratropium 17 MCG/ACT inhaler Commonly known as: ATROVENT  HFA Inhale 2 puffs into the lungs every 6 (six) hours as needed for wheezing.   ondansetron  4 MG disintegrating tablet Commonly known as: ZOFRAN -ODT Take 1 tablet (4 mg total) by mouth 2 (two) times daily as needed for nausea.   oxyCODONE -acetaminophen  10-325 MG tablet Commonly known as: PERCOCET Take 1 tablet by mouth 3 (three) times daily as needed.   pantoprazole  40 MG tablet Commonly known as: Protonix  Take 1 tablet (40 mg total) by mouth 2 (two) times daily before a meal.   predniSONE  5 MG tablet Commonly known as: DELTASONE  Take 5 mg by mouth daily with breakfast.   Prenatal 28-0.8 MG Tabs Take 1 tablet by mouth daily.   tizanidine  6 MG capsule Commonly known as: Zanaflex  Take 1 capsule (6 mg total) by mouth 2 (two) times daily as needed.   Vitamin D (Ergocalciferol) 1.25 MG (50000 UNIT) Caps capsule Commonly known as: DRISDOL Take 50,000 Units by mouth once a week.          REVIEW OF SYSTEMS: A comprehensive ROS was conducted with the patient and is negative except as per HPI and below:  ROS     OBJECTIVE:   VS: LMP 06/05/2024 (Approximate)    Wt Readings from Last 3 Encounters:  06/05/24 170 lb (77.1 kg)  05/17/24 170 lb (77.1 kg)  02/08/24 170 lb (77.1 kg)     EXAM: General: Pt appears well and is in NAD  Eyes: External eye exam normal without stare, lid lag or exophthalmos.  EOM intact.  PERRL.  Neck: General: Supple without adenopathy. Thyroid : Thyroid  size normal.  No goiter or nodules appreciated. No thyroid  bruit.  Lungs: Clear with good BS bilat   Heart: Auscultation: RRR.  Abdomen: Soft, nontender  Extremities:  BL LE: No pretibial edema   Mental Status: Judgment, insight: Intact Orientation: Oriented to time, place, and person Mood and affect: No depression, anxiety, or agitation     DATA REVIEWED: ***    ASSESSMENT/PLAN/RECOMMENDATIONS:   ***  Medications :  Signed electronically by: Stefano Redgie Butts, MD  [facility] Endocrinology  [facility] [address] [place], [place] [address] Phone: [phone] FAX: [phone]   CC: Austin Mutton, MD [address] [place] [address] Phone: [phone] Fax: [phone]   Return to Endocrinology clinic as below: Future Appointments  Date Time Provider Department Center  06/18/2024 11:30 AM Emelin Dascenzo, Donell Redgie, MD [facility] None  07/25/2024  1:30 PM Dallie Vera GAILS, MD [facility] None

## 2024-07-16 ENCOUNTER — Telehealth: Admitting: Physician Assistant

## 2024-07-16 DIAGNOSIS — N3090 Cystitis, unspecified without hematuria: Secondary | ICD-10-CM

## 2024-07-16 DIAGNOSIS — N760 Acute vaginitis: Secondary | ICD-10-CM

## 2024-07-16 MED ORDER — CEPHALEXIN 500 MG PO CAPS: 500.0000 mg | ORAL_CAPSULE | Freq: Two times a day (BID) | ORAL | 0 refills | Status: AC

## 2024-07-16 MED ORDER — FLUCONAZOLE 150 MG PO TABS
ORAL_TABLET | ORAL | 0 refills | Status: DC
Start: 2024-07-16 — End: 2024-08-08

## 2024-07-16 NOTE — Progress Notes (Signed)
 E-Visit for Urinary Problems  We are sorry that you are not feeling well.  Here is how we plan to help!  Based on what you shared with me it looks like you most likely have a simple urinary tract infection.  A UTI (Urinary Tract Infection) is a bacterial infection of the bladder.  Most cases of urinary tract infections are simple to treat but a key part of your care is to encourage you to drink plenty of fluids and watch your symptoms carefully.  I have prescribed Keflex  500 mg twice a day for 7 days. You have also been prescribed fluconazole  to treat a yeast infection. Please take as directed.  Your symptoms should gradually improve. Call us  if the burning in your urine worsens, you develop worsening fever, back pain or pelvic pain or if your symptoms do not resolve after completing the antibiotic.  Urinary tract infections can be prevented by drinking plenty of water  to keep your body hydrated.  Also be sure when you wipe, wipe from front to back and don't hold it in!  If possible, empty your bladder every 4 hours.  HOME CARE Drink plenty of fluids Compete the full course of the antibiotics even if the symptoms resolve Remember, when you need to go.go. Holding in your urine can increase the likelihood of getting a UTI! GET HELP RIGHT AWAY IF: You cannot urinate You get a high fever Worsening back pain occurs You see blood in your urine You feel sick to your stomach or throw up You feel like you are going to pass out  MAKE SURE YOU  Understand these instructions. Will watch your condition. Will get help right away if you are not doing well or get worse.   Thank you for choosing an e-visit.  Your e-visit answers were reviewed by a board certified advanced clinical practitioner to complete your personal care plan. Depending upon the condition, your plan could have included both over the counter or prescription medications.  Please review your pharmacy choice. Make sure the pharmacy  is open so you can pick up prescription now. If there is a problem, you may contact your provider through [employer] and have the prescription routed to another pharmacy.  Your safety is important to us . If you have drug allergies check your prescription carefully.   For the next 24 hours you can use MyChart to ask questions about today's visit, request a non-urgent call back, or ask for a work or school excuse. You will get an email in the next two days asking about your experience. I hope that your e-visit has been valuable and will speed your recovery.   Approximately 5 minutes was spent documenting and reviewing patient's chart.

## 2024-07-24 NOTE — Progress Notes (Incomplete)
 33 y.o. [id number] female here for annual exam. Single.  No LMP recorded.    She reports ***. Urine sample provided: ***  Abnormal bleeding: *** Pelvic discharge or pain: *** Breast mass, nipple discharge or skin changes : ***  Sexually active: ***  Birth control: *** Last PAP:     Component Value Date/Time   DIAGPAP  03/08/2022 1358    - Negative for intraepithelial lesion or malignancy (NILM)   ADEQPAP  03/08/2022 1358    Satisfactory for evaluation; transformation zone component ABSENT.   Gardasil: ***  Exercising: *** Smoker: ***  Flowsheet Row Initial Prenatal from 02/23/2023 in [facility]  PHQ-2 Total Score 3    Flowsheet Row Initial Prenatal from 02/23/2023 in [facility]  PHQ-9 Total Score 10     GYN HISTORY: ***  OB History  Gravida Para Term Preterm AB Living  5 3 2 1 2 2   SAB IAB Ectopic Multiple Live Births  2 0 0 0 2    # Outcome Date GA Lbr Len/2nd Weight Sex Type Anes PTL Lv  5 Term 08/19/23 [redacted]w[redacted]d / 00:06 6 lb 9.1 oz (2.98 kg) F Vag-Spont EPI  LIV  4 Term 09/13/17 [redacted]w[redacted]d 179:01 / 00:41 5 lb 13.7 oz (2.655 kg) F Vag-Spont EPI  LIV  3 Preterm 10/05/16   4 lb 15.4 oz (2.25 kg) F   Y FD  2 SAB           1 SAB            Past Medical History:  Diagnosis Date   Acute respiratory failure with hypoxemia (HCC) 09/19/2017   Acute respiratory failure with hypoxia (HCC)    Arthritis    hands and legs (01/08/2015)   CAP (community acquired pneumonia) 01/07/2015   Chronic Respiratory failure with oxygen requirement affecting pregnancy, antepartum 05/16/2016   Resolved pulmonary HTN on 2018 echo (in our epic).   Pt off O2 in 2018.   Daily headache    sometimes (01/08/2015)   Depression    2017   Gastroesophageal reflux disease without esophagitis 07/13/2020   GERD (gastroesophageal reflux disease)    Hypertension    No longer takes meds   ILD  (interstitial lung disease) (HCC)    Insomnia 08/21/2015   Loud P2 (pulmonary S2, second heart sound) 08/21/2015   Lung disease    Lupus    Lupus (systemic lupus erythematosus) (HCC) 08/21/2015   Multifocal pneumonia    Pulmonary hypertension (HCC)    Seasonal allergies 03/02/2023   Sjogren's syndrome (HCC)    SS-A antibody positive 06/02/2016   SS-B antibody positive 06/02/2016   STD (sexually transmitted disease)    Chlamydia   Vaginal Pap smear, abnormal    repeats have been ok   Viral illness 03/02/2023   Past Surgical History:  Procedure Laterality Date   CARDIAC CATHETERIZATION N/A 09/09/2015   Procedure: Right Heart Cath;  Surgeon: Ezra GORMAN Shuck, MD;  Location: [facility] INVASIVE CV LAB;  Service: Cardiovascular;  Laterality: N/A;   DILATION AND EVACUATION N/A 07/13/2015   Procedure: DILATATION AND EVACUATION;  Surgeon: Lang JINNY Peel, DO;  Location: [facility] ORS;  Service: Gynecology;  Laterality: N/A;   FINGER SURGERY Right 03/2014   laceration, nerve/artery injury 2nd digit   VIDEO BRONCHOSCOPY Bilateral 01/12/2015   Procedure: VIDEO BRONCHOSCOPY WITH FLUORO;  Surgeon: Lamar GORMAN Chris, MD;  Location: [facility] ENDOSCOPY;  Service: Cardiopulmonary;  Laterality: Bilateral;   Current Outpatient Medications on File Prior to Visit  Medication Sig Dispense Refill   acetaminophen  (TYLENOL ) 500 MG tablet Take 1,000 mg by mouth every 6 (six) hours as needed.     albuterol  (VENTOLIN  HFA) 108 (90 Base) MCG/ACT inhaler Inhale 2 puffs into the lungs every 6 (six) hours as needed.     aluminum -magnesium  hydroxide-simethicone  (MAALOX) 200-200-20 MG/5ML SUSP Take 30 mLs by mouth every 4 (four) hours as needed. 355 mL 0   ASPIRIN  LOW DOSE 81 MG tablet Take 81 mg by mouth daily.     azaTHIOprine  (IMURAN ) 50 MG tablet Take 150 mg by mouth.     cetirizine (ZYRTEC) 10 MG tablet Take 10 mg by mouth.     esomeprazole (NEXIUM) 40 MG capsule Take 40 mg by mouth daily at 12 noon.     fluconazole  (DIFLUCAN ) 150 MG  tablet Take one tablet on the day you fill the prescription. If you continue to have symptoms then take the second tablet in 3 days. 2 tablet 0   fluticasone  (FLONASE ) 50 MCG/ACT nasal spray Place 1 spray into the nose.     hydroxychloroquine  (PLAQUENIL ) 200 MG tablet Take 200 mg by mouth 2 (two) times daily.     ibuprofen  (ADVIL ) 800 MG tablet Take 1 tablet (800 mg total) by mouth 3 (three) times daily. 21 tablet 0   ipratropium (ATROVENT  HFA) 17 MCG/ACT inhaler Inhale 2 puffs into the lungs every 6 (six) hours as needed for wheezing. 1 each 12   ondansetron  (ZOFRAN -ODT) 4 MG disintegrating tablet Take 1 tablet (4 mg total) by mouth 2 (two) times daily as needed for nausea. 14 tablet 0   oxyCODONE -acetaminophen  (PERCOCET) 10-325 MG tablet Take 1 tablet by mouth 3 (three) times daily as needed.     pantoprazole  (PROTONIX ) 40 MG tablet Take 1 tablet (40 mg total) by mouth 2 (two) times daily before a meal. 60 tablet 5   predniSONE  (DELTASONE ) 5 MG tablet Take 5 mg by mouth daily with breakfast.     Prenatal 28-0.8 MG TABS Take 1 tablet by mouth daily. 30 tablet 12   tizanidine  (ZANAFLEX ) 6 MG capsule Take 1 capsule (6 mg total) by mouth 2 (two) times daily as needed. 20 capsule 0   Vitamin D, Ergocalciferol, (DRISDOL) 1.25 MG (50000 UNIT) CAPS capsule Take 50,000 Units by mouth once a week.     [DISCONTINUED] amLODipine  (NORVASC ) 5 MG tablet Take 1 tablet (5 mg total) by mouth daily.     No current facility-administered medications on file prior to visit.   Social History   Socioeconomic History   Marital status: Single    Spouse name: Not on file   Number of children: Not on file   Years of education: Not on file   Highest education level: Not on file  Occupational History   Occupation: [employer]     Comment: Workers Compensation  Tobacco Use   Smoking status: Former    Current packs/day: 0.00    Average packs/day: 0.1 packs/day for 5.0 years (0.5 ttl pk-yrs)    Types: Cigarettes     Start date: 11/20/2009    Quit date: 11/20/2014    Years since quitting: 9.6   Smokeless tobacco: Never  Vaping Use   Vaping status: Never Used  Substance and Sexual Activity   Alcohol use: Not Currently    Comment: Occas   Drug use: No   Sexual activity: Not Currently    Birth  control/protection: None    Comment: First IC <16 y/o, Hx of CT+  Other Topics Concern   Not on file  Social History Narrative   Not on file   Social Drivers of Health   Financial Resource Strain: Low Risk  (06/14/2024)   Received from [facility]   Overall Financial Resource Strain (CARDIA)    Difficulty of Paying Living Expenses: Not hard at all  Food Insecurity: No Food Insecurity (06/14/2024)   Received from [facility]   Hunger Vital Sign    Within the past 12 months, you worried that your food would run out before you got the money to buy more.: Never true    Within the past 12 months, the food you bought just didn't last and you didn't have money to get more.: Never true  Transportation Needs: No Transportation Needs (06/14/2024)   Received from [facility] - Transportation    In the past 12 months, has lack of transportation kept you from medical appointments or from getting medications?: No    Lack of Transportation (Non-Medical): No  Physical Activity: Not on file  Stress: Not on file  Social Connections: Not on file  Intimate Partner Violence: Not At Risk (08/25/2023)   Humiliation, Afraid, Rape, and Kick questionnaire    Fear of Current or Ex-Partner: No    Emotionally Abused: No    Physically Abused: No    Sexually Abused: No   Family History  Problem Relation Age of Onset   Hypertension Mother    Deep vein thrombosis Mother    Arthritis Father    Cancer Brother        Found in jaw area   Diabetes Maternal Aunt    Stroke Maternal Grandmother    Hypertension Maternal Grandmother    Diabetes Maternal Grandmother     Hypertension Maternal Grandfather    Allergies  Allergen Reactions   Hydrocodone  Nausea And Vomiting   Zithromax  [Azithromycin ] Itching and Cough    PE There were no vitals filed for this visit. There is no height or weight on file to calculate BMI.  Physical Exam    Assessment and Plan:        There are no diagnoses linked to this encounter. Clotilda FORBES Pa, CMA

## 2024-07-25 ENCOUNTER — Ambulatory Visit: Admitting: Obstetrics and Gynecology

## 2024-08-02 ENCOUNTER — Encounter: Payer: Self-pay | Admitting: Cardiology

## 2024-08-06 ENCOUNTER — Ambulatory Visit: Admitting: Obstetrics and Gynecology

## 2024-08-07 NOTE — Progress Notes (Signed)
 33 y.o. [id number] female with SLE, ILD, HTN here for annual exam. Single. PCP: Austin Mutton, MD  Patient's last menstrual period was 07/26/2024 (approximate). Period Duration (Days): 3 Period Pattern: Regular Menstrual Flow: Moderate Menstrual Control: Maxi pad, Tampon Dysmenorrhea: None  She reports vaginal itching.  She recently completed antibiotics and took a dose of Diflucan  after that.  That was approximately 1 week ago. FH of early perimenopause: age 60 for mother, age 13 for grandmother.  Recently referred to endocrine for ?hyperthyroidism. Exp management at this time.  Symptoms have improved.  Urine sample provided: No  Abnormal bleeding: None Pelvic discharge or pain: None Breast mass, nipple discharge or skin changes : None  Sexually active: Not currently  Birth control: none Last PAP:     Component Value Date/Time   DIAGPAP  03/08/2022 1358    - Negative for intraepithelial lesion or malignancy (NILM)   ADEQPAP  03/08/2022 1358    Satisfactory for evaluation; transformation zone component ABSENT.   Gardasil: complete  Exercising: Yes walking daily 7 days a week Smoker: No  Flowsheet Row Office Visit from 08/08/2024 in [facility]  PHQ-2 Total Score 0    Flowsheet Row Initial Prenatal from 02/23/2023 in [facility]  PHQ-9 Total Score 10     GYN HISTORY: No sig hx  OB History  Gravida Para Term Preterm AB Living  5 3 2 1 2 2   SAB IAB Ectopic Multiple Live Births  2 0 0 0 2    # Outcome Date GA Lbr Len/2nd Weight Sex Type Anes PTL Lv  5 Term 08/19/23 [redacted]w[redacted]d / 00:06 6 lb 9.1 oz (2.98 kg) F Vag-Spont EPI  LIV  4 Term 09/13/17 [redacted]w[redacted]d 179:01 / 00:41 5 lb 13.7 oz (2.655 kg) F Vag-Spont EPI  LIV  3 Preterm 10/05/16   4 lb 15.4 oz (2.25 kg) F   Y FD  2 SAB           1 SAB            Past Medical History:  Diagnosis Date   Acute respiratory failure with hypoxemia (HCC) 09/19/2017    Acute respiratory failure with hypoxia (HCC)    Arthritis    hands and legs (01/08/2015)   CAP (community acquired pneumonia) 01/07/2015   Chronic Respiratory failure with oxygen requirement affecting pregnancy, antepartum 05/16/2016   Resolved pulmonary HTN on 2018 echo (in our epic).   Pt off O2 in 2018.   Daily headache    sometimes (01/08/2015)   Depression    2017   Gastroesophageal reflux disease without esophagitis 07/13/2020   GERD (gastroesophageal reflux disease)    Hypertension    No longer takes meds   ILD (interstitial lung disease) (HCC)    Insomnia 08/21/2015   Loud P2 (pulmonary S2, second heart sound) 08/21/2015   Lung disease    Lupus    Lupus (systemic lupus erythematosus) (HCC) 08/21/2015   Multifocal pneumonia    Pulmonary hypertension (HCC)    Seasonal allergies 03/02/2023   Sjogren's syndrome (HCC)    SS-A antibody positive 06/02/2016   SS-B antibody positive 06/02/2016   STD (sexually transmitted disease)    Chlamydia   Vaginal Pap smear, abnormal    repeats have been ok   Viral illness 03/02/2023   Past Surgical History:  Procedure Laterality Date   CARDIAC CATHETERIZATION N/A 09/09/2015   Procedure: Right Heart Cath;  Surgeon:  Ezra GORMAN Shuck, MD;  Location: [facility] INVASIVE CV LAB;  Service: Cardiovascular;  Laterality: N/A;   DILATION AND EVACUATION N/A 07/13/2015   Procedure: DILATATION AND EVACUATION;  Surgeon: Lang JINNY Peel, DO;  Location: [facility] ORS;  Service: Gynecology;  Laterality: N/A;   FINGER SURGERY Right 03/2014   laceration, nerve/artery injury 2nd digit   VIDEO BRONCHOSCOPY Bilateral 01/12/2015   Procedure: VIDEO BRONCHOSCOPY WITH FLUORO;  Surgeon: Lamar GORMAN Chris, MD;  Location: [facility] ENDOSCOPY;  Service: Cardiopulmonary;  Laterality: Bilateral;   Current Outpatient Medications on File Prior to Visit  Medication Sig Dispense Refill   acetaminophen  (TYLENOL ) 500 MG tablet Take 1,000 mg by mouth every 6 (six) hours as needed.      albuterol  (VENTOLIN  HFA) 108 (90 Base) MCG/ACT inhaler Inhale 2 puffs into the lungs every 6 (six) hours as needed.     aluminum -magnesium  hydroxide-simethicone  (MAALOX) 200-200-20 MG/5ML SUSP Take 30 mLs by mouth every 4 (four) hours as needed. 355 mL 0   ASPIRIN  LOW DOSE 81 MG tablet Take 81 mg by mouth daily.     azaTHIOprine  (IMURAN ) 50 MG tablet Take 150 mg by mouth.     busPIRone (BUSPAR) 10 MG tablet Take 10 mg by mouth 3 (three) times daily.     cetirizine (ZYRTEC) 10 MG tablet Take 10 mg by mouth.     esomeprazole (NEXIUM) 40 MG capsule Take 40 mg by mouth daily at 12 noon.     fluticasone  (FLONASE ) 50 MCG/ACT nasal spray Place 1 spray into the nose.     hydroxychloroquine  (PLAQUENIL ) 200 MG tablet Take 200 mg by mouth 2 (two) times daily.     ibuprofen  (ADVIL ) 800 MG tablet Take 1 tablet (800 mg total) by mouth 3 (three) times daily. 21 tablet 0   ipratropium (ATROVENT  HFA) 17 MCG/ACT inhaler Inhale 2 puffs into the lungs every 6 (six) hours as needed for wheezing. 1 each 12   ondansetron  (ZOFRAN -ODT) 4 MG disintegrating tablet Take 1 tablet (4 mg total) by mouth 2 (two) times daily as needed for nausea. 14 tablet 0   oxyCODONE -acetaminophen  (PERCOCET) 10-325 MG tablet Take 1 tablet by mouth 3 (three) times daily as needed.     pantoprazole  (PROTONIX ) 40 MG tablet Take 1 tablet (40 mg total) by mouth 2 (two) times daily before a meal. 60 tablet 5   predniSONE  (DELTASONE ) 5 MG tablet Take 5 mg by mouth daily with breakfast.     Prenatal 28-0.8 MG TABS Take 1 tablet by mouth daily. 30 tablet 12   tizanidine  (ZANAFLEX ) 6 MG capsule Take 1 capsule (6 mg total) by mouth 2 (two) times daily as needed. 20 capsule 0   Vitamin D, Ergocalciferol, (DRISDOL) 1.25 MG (50000 UNIT) CAPS capsule Take 50,000 Units by mouth once a week.     [DISCONTINUED] amLODipine  (NORVASC ) 5 MG tablet Take 1 tablet (5 mg total) by mouth daily.     No current facility-administered medications on file prior to visit.    Social History   Socioeconomic History   Marital status: Single    Spouse name: Not on file   Number of children: Not on file   Years of education: Not on file   Highest education level: Not on file  Occupational History   Occupation: [employer]     Comment: Workers Compensation  Tobacco Use   Smoking status: Former    Current packs/day: 0.00    Average packs/day: 0.1 packs/day for 5.0 years (0.5 ttl pk-yrs)  Types: Cigarettes    Start date: 11/20/2009    Quit date: 11/20/2014    Years since quitting: 9.7   Smokeless tobacco: Never  Vaping Use   Vaping status: Never Used  Substance and Sexual Activity   Alcohol use: Not Currently    Comment: Occas   Drug use: No   Sexual activity: Not Currently    Birth control/protection: None    Comment: First IC <16 y/o, Hx of CT+  Other Topics Concern   Not on file  Social History Narrative   Not on file   Social Drivers of Health   Financial Resource Strain: Low Risk  (06/14/2024)   Received from [facility]   Overall Financial Resource Strain (CARDIA)    Difficulty of Paying Living Expenses: Not hard at all  Food Insecurity: No Food Insecurity (08/05/2024)   Received from [facility]   Hunger Vital Sign    Within the past 12 months, you worried that your food would run out before you got the money to buy more.: Never true    Within the past 12 months, the food you bought just didn't last and you didn't have money to get more.: Never true  Transportation Needs: No Transportation Needs (08/05/2024)   Received from [facility] - Transportation    In the past 12 months, has lack of transportation kept you from medical appointments or from getting medications?: No    Lack of Transportation (Non-Medical): No  Physical Activity: Not on file  Stress: Not on file  Social Connections: Not on file  Intimate Partner Violence: Not At Risk (08/25/2023)   Humiliation, Afraid,  Rape, and Kick questionnaire    Fear of Current or Ex-Partner: No    Emotionally Abused: No    Physically Abused: No    Sexually Abused: No   Family History  Problem Relation Age of Onset   Hypertension Mother    Deep vein thrombosis Mother    Arthritis Father    Cancer Brother        Found in jaw area   Diabetes Maternal Aunt    Stroke Maternal Grandmother    Hypertension Maternal Grandmother    Diabetes Maternal Grandmother    Hypertension Maternal Grandfather    Allergies  Allergen Reactions   Hydrocodone  Nausea And Vomiting   Zithromax  [Azithromycin ] Itching and Cough    PE Today's Vitals   08/08/24 1010  BP: 110/64  Pulse: 92  Temp: 98.7 F (37.1 C)  TempSrc: Oral  SpO2: 99%  Weight: 180 lb (81.6 kg)  Height: 5' 3.25 (1.607 m)   Body mass index is 31.63 kg/m.  Physical Exam Vitals reviewed. Exam conducted with a chaperone present.  Constitutional:      General: She is not in acute distress.    Appearance: Normal appearance.  HENT:     Head: Normocephalic and atraumatic.     Nose: Nose normal.  Eyes:     Extraocular Movements: Extraocular movements intact.     Conjunctiva/sclera: Conjunctivae normal.  Neck:     Thyroid : No thyroid  mass, thyromegaly or thyroid  tenderness.  Pulmonary:     Effort: Pulmonary effort is normal.  Chest:     Chest wall: No mass or tenderness.  Breasts:    Right: Normal. No swelling, mass, nipple discharge, skin change or tenderness.     Left: Normal. No swelling, mass, nipple discharge, skin change or tenderness.  Abdominal:  General: There is no distension.     Palpations: Abdomen is soft.     Tenderness: There is no abdominal tenderness.  Genitourinary:    General: Normal vulva.     Exam position: Lithotomy position.     Urethra: No prolapse.     Vagina: Normal. No vaginal discharge or bleeding.     Cervix: Normal. No lesion.     Uterus: Normal. Not enlarged and not tender.      Adnexa: Right adnexa normal and  left adnexa normal.  Musculoskeletal:        General: Normal range of motion.     Cervical back: Normal range of motion.  Lymphadenopathy:     Upper Body:     Right upper body: No axillary adenopathy.     Left upper body: No axillary adenopathy.     Lower Body: No right inguinal adenopathy. No left inguinal adenopathy.  Skin:    General: Skin is warm and dry.  Neurological:     General: No focal deficit present.     Mental Status: She is alert.  Psychiatric:        Mood and Affect: Mood normal.        Behavior: Behavior normal.       Assessment and Plan:        Well woman exam with routine gynecological exam Assessment & Plan: Cervical cancer screening performed according to ASCCP guidelines.  Labs and immunizations with her primary Encouraged safe sexual practices as indicated Encouraged healthy lifestyle practices with diet and exercise For patients under 50yo, I recommend 1000mg  calcium  daily and 600IU of vitamin D daily.    Screening for depression  Vaginal itching -     WET PREP FOR TRICH, YEAST, CLUE  Will call with results Vera LULLA Pa, MD

## 2024-08-08 ENCOUNTER — Encounter: Payer: Self-pay | Admitting: Obstetrics and Gynecology

## 2024-08-08 ENCOUNTER — Ambulatory Visit (INDEPENDENT_AMBULATORY_CARE_PROVIDER_SITE_OTHER): Admitting: Obstetrics and Gynecology

## 2024-08-08 ENCOUNTER — Ambulatory Visit: Payer: Self-pay | Admitting: Obstetrics and Gynecology

## 2024-08-08 VITALS — BP 110/64 | HR 92 | Temp 98.7°F | Ht 63.25 in | Wt 180.0 lb

## 2024-08-08 DIAGNOSIS — Z01419 Encounter for gynecological examination (general) (routine) without abnormal findings: Secondary | ICD-10-CM | POA: Diagnosis not present

## 2024-08-08 DIAGNOSIS — Z9189 Other specified personal risk factors, not elsewhere classified: Secondary | ICD-10-CM | POA: Diagnosis not present

## 2024-08-08 DIAGNOSIS — Z1331 Encounter for screening for depression: Secondary | ICD-10-CM | POA: Diagnosis not present

## 2024-08-08 DIAGNOSIS — Z8619 Personal history of other infectious and parasitic diseases: Secondary | ICD-10-CM | POA: Diagnosis not present

## 2024-08-08 DIAGNOSIS — N898 Other specified noninflammatory disorders of vagina: Secondary | ICD-10-CM | POA: Diagnosis not present

## 2024-08-08 DIAGNOSIS — N760 Acute vaginitis: Secondary | ICD-10-CM

## 2024-08-08 DIAGNOSIS — Z124 Encounter for screening for malignant neoplasm of cervix: Secondary | ICD-10-CM | POA: Diagnosis not present

## 2024-08-08 LAB — WET PREP FOR TRICH, YEAST, CLUE

## 2024-08-08 MED ORDER — METRONIDAZOLE 0.75 % VA GEL: 1.0000 | Freq: Every day | VAGINAL | 0 refills | Status: AC

## 2024-08-08 NOTE — Patient Instructions (Signed)

## 2024-08-08 NOTE — Assessment & Plan Note (Signed)
 Cervical cancer screening performed according to ASCCP guidelines. Labs and immunizations with her primary Encouraged safe sexual practices as indicated Encouraged healthy lifestyle practices with diet and exercise For patients under 34yo, I recommend 1000mg  calcium daily and 600IU of vitamin D daily.

## 2024-09-01 ENCOUNTER — Telehealth: Admitting: Family

## 2024-09-01 DIAGNOSIS — K047 Periapical abscess without sinus: Secondary | ICD-10-CM | POA: Diagnosis not present

## 2024-09-01 DIAGNOSIS — K0889 Other specified disorders of teeth and supporting structures: Secondary | ICD-10-CM

## 2024-09-01 MED ORDER — FLUCONAZOLE 150 MG PO TABS: 150.0000 mg | ORAL_TABLET | ORAL | 0 refills | Status: DC | PRN

## 2024-09-01 MED ORDER — PENICILLIN V POTASSIUM 500 MG PO TABS: 500.0000 mg | ORAL_TABLET | Freq: Three times a day (TID) | ORAL | 0 refills | Status: AC

## 2024-09-01 MED ORDER — AMOXICILLIN-POT CLAVULANATE 875-125 MG PO TABS: 1.0000 | ORAL_TABLET | Freq: Two times a day (BID) | ORAL | 0 refills | Status: DC

## 2024-09-01 NOTE — Addendum Note (Signed)
 Addended by: LAVELL LYE A on: 09/01/2024 03:52 PM   Modules accepted: Orders

## 2024-09-01 NOTE — Progress Notes (Signed)
 E-Visit for Dental Pain  We are sorry that you are not feeling well.  Here is how we plan to help!  Based on what you have shared with me in the questionnaire, it sounds like you have dental pain.   Augmentin  875-125mg  twice a day for 7 days  It is imperative that you see a dentist within 10 days of this eVisit to determine the cause of the dental pain and be sure it is adequately treated  A toothache or tooth pain is caused when the nerve in the root of a tooth or surrounding a tooth is irritated. Dental (tooth) infection, decay, injury, or loss of a tooth are the most common causes of dental pain. Pain may also occur after an extraction (tooth is pulled out). Pain sometimes originates from other areas and radiates to the jaw, thus appearing to be tooth pain.Bacteria growing inside your mouth can contribute to gum disease and dental decay, both of which can cause pain. A toothache occurs from inflammation of the central portion of the tooth called pulp. The pulp contains nerve endings that are very sensitive to pain. Inflammation to the pulp or pulpitis may be caused by dental cavities, trauma, and infection.    HOME CARE:   For toothaches: Over-the-counter pain medications such as acetaminophen  or ibuprofen  may be used. Take these as directed on the package while you arrange for a dental appointment. Avoid very cold or hot foods, because they may make the pain worse. You may get relief from biting on a cotton ball soaked in oil of cloves. You can get oil of cloves at most drug stores.  For jaw pain:  Aspirin may be helpful for problems in the joint of the jaw in adults. If pain happens every time you open your mouth widely, the temporomandibular joint (TMJ) may be the source of the pain. Yawning or taking a large bite of food may worsen the pain. An appointment with your doctor or dentist will help you find the cause.     GET HELP RIGHT AWAY IF:  You have a high fever or chills If you  have had a recent head or face injury and develop headache, light headedness, nausea, vomiting, or other symptoms that concern you after an injury to your face or mouth, you could have a more serious injury in addition to your dental injury. A facial rash associated with a toothache: This condition may improve with medication. Contact your doctor for them to decide what is appropriate. Any jaw pain occurring with chest pain: Although jaw pain is most commonly caused by dental disease, it is sometimes referred pain from other areas. People with heart disease, especially people who have had stents placed, people with diabetes, or those who have had heart surgery may have jaw pain as a symptom of heart attack or angina. If your jaw or tooth pain is associated with lightheadedness, sweating, or shortness of breath, you should see a doctor as soon as possible. Trouble swallowing or excessive pain or bleeding from gums: If you have a history of a weakened immune system, diabetes, or steroid use, you may be more susceptible to infections. Infections can often be more severe and extensive or caused by unusual organisms. Dental and gum infections in people with these conditions may require more aggressive treatment. An abscess may need draining or IV antibiotics, for example.  MAKE SURE YOU   Understand these instructions. Will watch your condition. Will get help right away if you are  not doing well or get worse.  Thank you for choosing an e-visit.  Your e-visit answers were reviewed by a board certified advanced clinical practitioner to complete your personal care plan. Depending upon the condition, your plan could have included both over the counter or prescription medications.  Please review your pharmacy choice. Make sure the pharmacy is open so you can pick up prescription now. If there is a problem, you may contact your provider through [employer] and have the prescription routed to another  pharmacy.  Your safety is important to us . If you have drug allergies check your prescription carefully.   For the next 24 hours you can use MyChart to ask questions about today's visit, request a non-urgent call back, or ask for a work or school excuse. You will get an email in the next two days asking about your experience. I hope that your e-visit has been valuable and will speed your recovery.  I have spent 5 minutes in review of e-visit questionnaire, review and updating patient chart, medical decision making and response to patient.   Bari Learn, FNP

## 2024-09-01 NOTE — Progress Notes (Signed)
 Discussed we sent in medication for her Evisit. States Augmentin  makes her sick. I will send in Pen VK 500mg  3 times a day for 7 days

## 2024-09-04 ENCOUNTER — Other Ambulatory Visit: Payer: Self-pay | Admitting: Family Medicine

## 2024-09-04 DIAGNOSIS — J849 Interstitial pulmonary disease, unspecified: Secondary | ICD-10-CM

## 2024-09-05 ENCOUNTER — Telehealth

## 2024-09-05 ENCOUNTER — Telehealth: Admitting: Physician Assistant

## 2024-09-05 DIAGNOSIS — H109 Unspecified conjunctivitis: Secondary | ICD-10-CM | POA: Diagnosis not present

## 2024-09-05 MED ORDER — OFLOXACIN 0.3 % OP SOLN: 1.0000 [drp] | Freq: Four times a day (QID) | OPHTHALMIC | 0 refills | Status: AC

## 2024-09-05 NOTE — Progress Notes (Signed)

## 2024-10-08 ENCOUNTER — Telehealth: Admitting: Family Medicine

## 2024-10-08 DIAGNOSIS — J069 Acute upper respiratory infection, unspecified: Secondary | ICD-10-CM

## 2024-10-08 MED ORDER — ALBUTEROL SULFATE HFA 108 (90 BASE) MCG/ACT IN AERS: 1.0000 | INHALATION_SPRAY | Freq: Four times a day (QID) | RESPIRATORY_TRACT | 0 refills | Status: AC | PRN

## 2024-10-08 MED ORDER — BENZONATATE 100 MG PO CAPS: 100.0000 mg | ORAL_CAPSULE | Freq: Three times a day (TID) | ORAL | 0 refills | Status: AC | PRN

## 2024-10-08 MED ORDER — FLUTICASONE PROPIONATE 50 MCG/ACT NA SUSP: 2.0000 | Freq: Every day | NASAL | 0 refills | Status: AC

## 2024-10-08 NOTE — Progress Notes (Signed)

## 2024-10-20 ENCOUNTER — Telehealth: Admitting: Family

## 2024-10-20 DIAGNOSIS — N898 Other specified noninflammatory disorders of vagina: Secondary | ICD-10-CM

## 2024-10-20 DIAGNOSIS — N760 Acute vaginitis: Secondary | ICD-10-CM

## 2024-10-20 DIAGNOSIS — B9689 Other specified bacterial agents as the cause of diseases classified elsewhere: Secondary | ICD-10-CM

## 2024-10-20 DIAGNOSIS — R350 Frequency of micturition: Secondary | ICD-10-CM

## 2024-10-20 DIAGNOSIS — B3731 Acute candidiasis of vulva and vagina: Secondary | ICD-10-CM | POA: Diagnosis not present

## 2024-10-20 MED ORDER — ONDANSETRON HCL 4 MG PO TABS: 4.0000 mg | ORAL_TABLET | Freq: Three times a day (TID) | ORAL | 0 refills | Status: AC | PRN

## 2024-10-20 MED ORDER — METRONIDAZOLE 500 MG PO TABS: 500.0000 mg | ORAL_TABLET | Freq: Two times a day (BID) | ORAL | 0 refills | Status: AC

## 2024-10-20 MED ORDER — FLUCONAZOLE 150 MG PO TABS: 150.0000 mg | ORAL_TABLET | ORAL | 0 refills | Status: AC | PRN

## 2024-10-20 NOTE — Progress Notes (Signed)
  Because of your vaginal discharge, urinary frequency, and back pain, I feel your condition warrants further evaluation and I recommend that you be seen in a face-to-face visit.   NOTE: There will be NO CHARGE for this E-Visit   If you are having a true medical emergency, please call 911.     For an urgent face to face visit, [place] has multiple urgent care centers for your convenience.  Click the link below for the full list of locations and hours, walk-in wait times, appointment scheduling options and driving directions:  Urgent Care - [facility], [place], [place], [place], [place], [place]  [place]     Your MyChart E-visit questionnaire answers were reviewed by a board certified advanced clinical practitioner to complete your personal care plan based on your specific symptoms.    Thank you for using e-Visits.

## 2024-10-20 NOTE — Patient Instructions (Signed)
 Vaginal Infection (Bacterial Vaginosis): What to Know  Bacterial vaginosis is an infection of the vagina. It happens when the balance of normal germs (bacteria) in the vagina changes. It's common among females ages 57 to 63. If left untreated, it can increase your risk of getting a sexually transmitted infection (STI). If you're pregnant, you need to get treated right away. This infection can cause a baby to be born early or at a low birth weight. What are the causes? This happens when too many harmful germs grow in the vagina. The exact reason why this happens isn't known. You can't get this infection from toilet seats, bedding, swimming pools, or contact with objects around you. What increases the risk? Having new or multiple sexual partners, or unprotected sex. Douching. Using an intrauterine device (IUD). Smoking. Alcohol and drug abuse. Taking certain antibiotics. Being pregnant. You can get a vaginal infection without being sexually active. However, it most often occurs in sexually active females. What are the signs or symptoms? Some females have no symptoms. If you have symptoms, they may include: Wallace Cullens or white vaginal discharge. It can be watery or foamy. A fish-like smell, especially after sex or during your menstrual period. Itching in and around the vagina. Burning or pain with peeing. How is this diagnosed? This infection is diagnosed based on: Your medical history. A physical exam of the vagina. Checking a sample of vaginal fluid for harmful bacteria or uncommon cells. How is this treated? This condition is treated with antibiotics. These may be given as: A pill. A cream for your vagina. A medicine that you put into your vagina called a suppository. If the infection comes back, you may need more antibiotics. Follow these instructions at home: Medicines Take your medicines only as told. Take or apply your antibiotics as told. Do not stop using them even if you start to  feel better. General instructions If you have a female sexual partner, tell her about the infection. She should see her health care provider. Female partners don't need treatment. Avoid sex until treatment is complete. Drink more fluids as told. Keep the area around your vagina and rectum clean. Wash the area daily with warm water. Wipe yourself from front to back after pooping. If you're breastfeeding, talk to your provider about continuing during treatment. How is this prevented? Self-care Do not douche or use vaginal deodorant sprays. Douching can upset the balance of good and harmful bacteria in the vagina, which can cause an infection to happen again. Wear cotton or cotton-lined underwear. Avoid wearing tight pants or pantyhose, especially in the summer. Safe sex Use condoms correctly and every time you have sex. Use dental dams to protect yourself during oral sex. Limit the number of sexual partners. Get tested for STIs. Your sexual partner should also get tested. Drugs and alcohol Do not smoke, vape, or use nicotine or tobacco. Do not use drugs. Limit the amount of alcohol you drink because it can lead to risky sexual behavior. Where to find more information To learn more: Go to [employer]erProm[employer].no. Click Health Topics A-Z. Type "bacterial vaginosis" in the search box. American Sexual Health Association (ASHA): ashasexualhealth.org U.S. Department of Health and Health and safety inspector, Office on Women's Health: TravelLesson.ca Contact a health care provider if: Your symptoms don't get better, even after treatment. You have more discharge or pain when peeing. You have a fever or chills. You have pain in your belly or pelvis. You have pain during sex. You have vaginal bleeding between menstrual periods.  This information is not intended to replace advice given to you by your health care provider. Make sure you discuss any questions you have with your health care provider. Document Revised:  04/26/2023 Document Reviewed: 04/26/2023 Elsevier Patient Education  2024 [employer].

## 2024-10-20 NOTE — Progress Notes (Signed)
 Virtual Visit Consent   [name], you are scheduled for a virtual visit with a [facility] provider today. Just as with appointments in the office, your consent must be obtained to participate. Your consent will be active for this visit and any virtual visit you may have with one of our providers in the next 365 days. If you have a MyChart account, a copy of this consent can be sent to you electronically.  As this is a virtual visit, video technology does not allow for your provider to perform a traditional examination. This may limit your provider's ability to fully assess your condition. If your provider identifies any concerns that need to be evaluated in person or the need to arrange testing (such as labs, EKG, etc.), we will make arrangements to do so. Although advances in technology are sophisticated, we cannot ensure that it will always work on either your end or our end. If the connection with a video visit is poor, the visit may have to be switched to a telephone visit. With either a video or telephone visit, we are not always able to ensure that we have a secure connection.  By engaging in this virtual visit, you consent to the provision of healthcare and authorize for your insurance to be billed (if applicable) for the services provided during this visit. Depending on your insurance coverage, you may receive a charge related to this service.  I need to obtain your verbal consent now. Are you willing to proceed with your visit today? Kayla [name] has provided verbal consent on 10/20/2024 for a virtual visit (video or telephone). Bari Learn, FNP  Date: 10/20/2024 3:18 PM   Virtual Visit via Video Note   I, Bari Learn, connected with  Kayla [name]  ([id number], 23-Aug-1991) on 10/20/24 at  3:30 PM EST by a video-enabled telemedicine application and verified that I am speaking with the correct person using two identifiers.  Location: Patient: Virtual Visit Location Patient:  Other: work Provider: Pharmacist, Community: Home Office   I discussed the limitations of evaluation and management by telemedicine and the availability of in person appointments. The patient expressed understanding and agreed to proceed.    History of Present Illness: Kayla [name] is a 33 y.o. who identifies as a female who was assigned female at birth, and is being seen today for vaginal discharge.  HPI: Vaginal Discharge The patient's primary symptoms include genital itching, a genital odor and vaginal discharge. The patient's pertinent negatives include no vaginal bleeding. The current episode started yesterday. The vaginal discharge was milky, white and thick.    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)    History of IUFD 02/14/2017   Chronic hypertension 03/14/2016   Systemic lupus erythematosus (SLE) affecting pregnancy, antepartum (HCC) 07/06/2015    Allergies:  Allergies  Allergen Reactions   Hydrocodone  Nausea And Vomiting   Zithromax  Edgar.dues ] Itching and Cough   Medications:  Current Outpatient Medications:    fluconazole  (DIFLUCAN ) 150 MG tablet, Take 1 tablet (150 mg total) by mouth every  three (3) days as needed., Disp: 3 tablet, Rfl: 0   metroNIDAZOLE  (FLAGYL ) 500 MG tablet, Take 1 tablet (500 mg total) by mouth 2 (two) times daily for 7 days., Disp: 14 tablet, Rfl: 0   ondansetron  (ZOFRAN ) 4 MG tablet, Take 1 tablet (4 mg total) by mouth every 8 (eight) hours  as needed for nausea or vomiting., Disp: 20 tablet, Rfl: 0   acetaminophen  (TYLENOL ) 500 MG tablet, Take 1,000 mg by mouth every 6 (six) hours as needed., Disp: , Rfl:    albuterol  (VENTOLIN  HFA) 108 (90 Base) MCG/ACT inhaler, Inhale 1-2 puffs into the lungs every 6 (six) hours as needed for wheezing or shortness of breath (cough)., Disp: 8 g, Rfl: 0   aluminum -magnesium  hydroxide-simethicone  (MAALOX) 200-200-20 MG/5ML SUSP, Take 30 mLs by mouth every 4 (four) hours as needed., Disp: 355 mL, Rfl: 0   amoxicillin -clavulanate (AUGMENTIN ) 875-125 MG tablet, Take 1 tablet by mouth 2 (two) times daily., Disp: 14 tablet, Rfl: 0   ASPIRIN  LOW DOSE 81 MG tablet, Take 81 mg by mouth daily., Disp: , Rfl:    benzonatate  (TESSALON ) 100 MG capsule, Take 1 capsule (100 mg total) by mouth 3 (three) times daily as needed for cough., Disp: 30 capsule, Rfl: 0   busPIRone (BUSPAR) 10 MG tablet, Take 10 mg by mouth 3 (three) times daily., Disp: , Rfl:    cetirizine (ZYRTEC) 10 MG tablet, Take 10 mg by mouth., Disp: , Rfl:    esomeprazole (NEXIUM) 40 MG capsule, Take 40 mg by mouth daily at 12 noon., Disp: , Rfl:    fluticasone  (FLONASE ) 50 MCG/ACT nasal spray, Place 2 sprays into both nostrils daily., Disp: 16 g, Rfl: 0   hydroxychloroquine  (PLAQUENIL ) 200 MG tablet, Take 200 mg by mouth 2 (two) times daily., Disp: , Rfl:    ibuprofen  (ADVIL ) 800 MG tablet, Take 1 tablet (800 mg total) by mouth 3 (three) times daily., Disp: 21 tablet, Rfl: 0   ipratropium (ATROVENT  HFA) 17 MCG/ACT inhaler, Inhale 2 puffs into the lungs every 6 (six) hours as needed for wheezing., Disp: 1 each, Rfl: 12   ondansetron  (ZOFRAN -ODT) 4 MG disintegrating tablet, Take 1 tablet (4 mg total) by mouth 2 (two) times daily as needed for nausea., Disp: 14 tablet, Rfl: 0   oxyCODONE -acetaminophen  (PERCOCET) 10-325 MG tablet, Take 1 tablet by mouth 3 (three) times daily as needed., Disp: , Rfl:    pantoprazole  (PROTONIX ) 40 MG tablet, Take 1 tablet (40  mg total) by mouth 2 (two) times daily before a meal., Disp: 60 tablet, Rfl: 5   predniSONE  (DELTASONE ) 5 MG tablet, Take 5 mg by mouth daily with breakfast., Disp: , Rfl:    Prenatal 28-0.8 MG TABS, Take 1 tablet by mouth daily., Disp: 30 tablet, Rfl: 12   tizanidine  (ZANAFLEX ) 6 MG capsule, Take 1 capsule (6 mg total) by mouth 2 (two) times daily as needed., Disp: 20 capsule, Rfl: 0   Vitamin D, Ergocalciferol, (DRISDOL) 1.25 MG (50000 UNIT) CAPS capsule, Take 50,000 Units by mouth once a week., Disp: , Rfl:   Observations/Objective: Patient is well-developed, well-nourished in no acute distress.  Resting comfortably Head is normocephalic, atraumatic.  No labored breathing.  Speech is clear and coherent with logical content.  Patient is alert and oriented at baseline.    Assessment and Plan: 1. Vaginal discharge (Primary)  2. BV (bacterial vaginosis) - metroNIDAZOLE  (FLAGYL ) 500 MG tablet; Take 1 tablet (500 mg total) by mouth 2 (two) times daily for 7  days.  Dispense: 14 tablet; Refill: 0 - ondansetron  (ZOFRAN ) 4 MG tablet; Take 1 tablet (4 mg total) by mouth every 8 (eight) hours as needed for nausea or vomiting.  Dispense: 20 tablet; Refill: 0  3. Vagina, candidiasis - fluconazole  (DIFLUCAN ) 150 MG tablet; Take 1 tablet (150 mg total) by mouth every three (3) days as needed.  Dispense: 3 tablet; Refill: 0  Start diflucan   Start Flagyl   Keep clean and dry  Avoid douching  Follow up if symptoms worsen or do not improve   Follow Up Instructions: I discussed the assessment and treatment plan with the patient. The patient was provided an opportunity to ask questions and all were answered. The patient agreed with the plan and demonstrated an understanding of the instructions.  A copy of instructions were sent to the patient via MyChart unless otherwise noted below.     The patient was advised to call back or seek an in-person evaluation if the symptoms worsen or if the condition  fails to improve as anticipated.    Bari Learn, FNP

## 2024-11-20 ENCOUNTER — Telehealth: Admitting: Nurse Practitioner

## 2024-11-20 DIAGNOSIS — T3695XA Adverse effect of unspecified systemic antibiotic, initial encounter: Secondary | ICD-10-CM

## 2024-11-20 DIAGNOSIS — B379 Candidiasis, unspecified: Secondary | ICD-10-CM | POA: Diagnosis not present

## 2024-11-20 DIAGNOSIS — B3731 Acute candidiasis of vulva and vagina: Secondary | ICD-10-CM

## 2024-11-20 MED ORDER — FLUCONAZOLE 150 MG PO TABS: 150.0000 mg | ORAL_TABLET | Freq: Once | ORAL | 0 refills | Status: AC

## 2024-11-20 NOTE — Progress Notes (Signed)

## 2024-11-29 ENCOUNTER — Encounter (HOSPITAL_COMMUNITY): Payer: Self-pay

## 2024-11-29 ENCOUNTER — Emergency Department (HOSPITAL_COMMUNITY)

## 2024-11-29 ENCOUNTER — Emergency Department (HOSPITAL_COMMUNITY)
Admission: EM | Admit: 2024-11-29 | Discharge: 2024-11-29 | Disposition: A | Discharge status: Home / Self Care | Attending: Emergency Medicine | Admitting: Emergency Medicine

## 2024-11-29 ENCOUNTER — Other Ambulatory Visit: Payer: Self-pay

## 2024-11-29 DIAGNOSIS — J40 Bronchitis, not specified as acute or chronic: Secondary | ICD-10-CM | POA: Diagnosis not present

## 2024-11-29 DIAGNOSIS — R040 Epistaxis: Secondary | ICD-10-CM | POA: Diagnosis not present

## 2024-11-29 DIAGNOSIS — R059 Cough, unspecified: Secondary | ICD-10-CM | POA: Diagnosis present

## 2024-11-29 DIAGNOSIS — Z7982 Long term (current) use of aspirin: Secondary | ICD-10-CM | POA: Insufficient documentation

## 2024-11-29 LAB — COMPREHENSIVE METABOLIC PANEL WITH GFR
ALT: 11 U/L (ref 0–44)
AST: 18 U/L (ref 15–41)
Albumin: 4.2 g/dL (ref 3.5–5.0)
Alkaline Phosphatase: 68 U/L (ref 38–126)
Anion gap: 9 (ref 5–15)
BUN: 10 mg/dL (ref 6–20)
CO2: 26 mmol/L (ref 22–32)
Calcium: 9.2 mg/dL (ref 8.9–10.3)
Chloride: 105 mmol/L (ref 98–111)
Creatinine, Ser: 0.61 mg/dL (ref 0.44–1.00)
GFR, Estimated: >60 mL/min
Glucose, Bld: 78 mg/dL (ref 70–99)
Potassium: 3.7 mmol/L (ref 3.5–5.1)
Sodium: 140 mmol/L (ref 135–145)
Total Bilirubin: 0.5 mg/dL (ref 0.0–1.2)
Total Protein: 6.7 g/dL (ref 6.5–8.1)

## 2024-11-29 LAB — URINALYSIS, ROUTINE W REFLEX MICROSCOPIC
Bilirubin Urine: NEGATIVE
Glucose, UA: NEGATIVE mg/dL
Hgb urine dipstick: NEGATIVE
Ketones, ur: NEGATIVE mg/dL
Leukocytes,Ua: NEGATIVE
Nitrite: NEGATIVE
Protein, ur: NEGATIVE mg/dL
Specific Gravity, Urine: 1.025 (ref 1.005–1.030)
pH: 7 (ref 5.0–8.0)

## 2024-11-29 LAB — CBC WITH DIFFERENTIAL/PLATELET
Abs Immature Granulocytes: 0.02 K/uL (ref 0.00–0.07)
Basophils Absolute: 0 K/uL (ref 0.0–0.1)
Basophils Relative: 1 %
Eosinophils Absolute: 0.1 K/uL (ref 0.0–0.5)
Eosinophils Relative: 1 %
HCT: 39.3 % (ref 36.0–46.0)
Hemoglobin: 13.1 g/dL (ref 12.0–15.0)
Immature Granulocytes: 0 %
Lymphocytes Relative: 43 %
Lymphs Abs: 2.4 K/uL (ref 0.7–4.0)
MCH: 28.6 pg (ref 26.0–34.0)
MCHC: 33.3 g/dL (ref 30.0–36.0)
MCV: 85.8 fL (ref 80.0–100.0)
Monocytes Absolute: 0.5 K/uL (ref 0.1–1.0)
Monocytes Relative: 9 %
Neutro Abs: 2.5 K/uL (ref 1.7–7.7)
Neutrophils Relative %: 46 %
Platelets: 208 K/uL (ref 150–400)
RBC: 4.58 MIL/uL (ref 3.87–5.11)
RDW: 11.9 % (ref 11.5–15.5)
WBC: 5.5 K/uL (ref 4.0–10.5)
nRBC: 0 % (ref 0.0–0.2)

## 2024-11-29 LAB — RESP PANEL BY RT-PCR (RSV, FLU A&B, COVID)  RVPGX2
Influenza A by PCR: NEGATIVE
Influenza B by PCR: NEGATIVE
Resp Syncytial Virus by PCR: NEGATIVE
SARS Coronavirus 2 by RT PCR: NEGATIVE

## 2024-11-29 MED ORDER — DOXYCYCLINE HYCLATE 100 MG PO CAPS: 100.0000 mg | ORAL_CAPSULE | Freq: Two times a day (BID) | ORAL | 0 refills | Status: DC

## 2024-11-29 MED ORDER — AMOXICILLIN-POT CLAVULANATE 875-125 MG PO TABS: 1.0000 | ORAL_TABLET | Freq: Two times a day (BID) | ORAL | 0 refills | Status: AC

## 2024-11-29 MED ORDER — DOXYCYCLINE HYCLATE 100 MG PO TABS: 100.0000 mg | ORAL_TABLET | Freq: Once | ORAL | Status: DC

## 2024-11-29 MED ORDER — SALINE SPRAY 0.65 % NA SOLN
1.0000 | Freq: Once | NASAL | Status: AC
  Administered 2024-11-29: 1 via NASAL
  Filled 2024-11-29: qty 44

## 2024-11-29 MED ORDER — AMOXICILLIN-POT CLAVULANATE 875-125 MG PO TABS
1.0000 | ORAL_TABLET | Freq: Once | ORAL | Status: AC
  Administered 2024-11-29: 1 via ORAL
  Filled 2024-11-29: qty 1

## 2024-11-29 NOTE — ED Triage Notes (Signed)
 Pt bib pov c/o congestion for two weeks. Pt endorses cp with aches on right arm that radiates to back that started a week ago. Pt states when she is walking she has pressure in her right. Pt states she took ibuprofen  and oxycodone  without relief. Pt notice blood right ear.   Pt endorses cough.  Pt denies fever, chills, N/V  Hx Lupus   Pt was told at [facility] Monday she has PNA and was given doxycycline . Pt is on day three abx. Pt doesn't feel that the dx was accurate.

## 2024-11-29 NOTE — ED Provider Notes (Signed)
 " [facility] Provider Note   CSN: [id number] Arrival date & time: 11/29/24  9183     Patient presents with: Chest Pain and Generalized Body Aches   Kayla [name] is a 34 y.o. female.   Pt is a 34 yo female with pmhx significant for lupus, interstitial lung disease, gerd, sjogren's syndrome, and depression.  Pt has had cough, congestion, and some cp.  She's also had some nose bleeds.  She did take ibuprofen  and oxycodone  without improvement in pain.  She did go to UC on 1/5 and was told that she had pna.  She was put on doxy, but is not getting better.  No f/c.         Prior to Admission medications  Medication Sig Start Date End Date Taking? Authorizing Provider  amoxicillin -clavulanate (AUGMENTIN ) 875-125 MG tablet Take 1 tablet by mouth every 12 (twelve) hours. 11/29/24  Yes Dean Clarity, MD  acetaminophen  (TYLENOL ) 500 MG tablet Take 1,000 mg by mouth every 6 (six) hours as needed.    [provider]  albuterol  (VENTOLIN  HFA) 108 (90 Base) MCG/ACT inhaler Inhale 1-2 puffs into the lungs every 6 (six) hours as needed for wheezing or shortness of breath (cough). 10/08/24   Moishe Chiquita HERO, NP  aluminum -magnesium  hydroxide-simethicone  (MAALOX) 200-200-20 MG/5ML SUSP Take 30 mLs by mouth every 4 (four) hours as needed. 05/17/24   Rising, Asberry, PA-C  ASPIRIN  LOW DOSE 81 MG tablet Take 81 mg by mouth daily. 09/26/23   [provider]  benzonatate  (TESSALON ) 100 MG capsule Take 1 capsule (100 mg total) by mouth 3 (three) times daily as needed for cough. 10/08/24   Moishe Chiquita HERO, NP  busPIRone (BUSPAR) 10 MG tablet Take 10 mg by mouth 3 (three) times daily. 07/02/24   [provider]  cetirizine (ZYRTEC) 10 MG tablet Take 10 mg by mouth. 03/03/24 03/03/25  [provider]  esomeprazole (NEXIUM) 40 MG capsule Take 40 mg by mouth daily at 12 noon. 09/04/23 09/03/24  [provider]  fluconazole   (DIFLUCAN ) 150 MG tablet Take 1 tablet (150 mg total) by mouth every three (3) days as needed. 10/20/24   Lavell Bari LABOR, FNP  fluticasone  (FLONASE ) 50 MCG/ACT nasal spray Place 2 sprays into both nostrils daily. 10/08/24   Moishe Chiquita HERO, NP  hydroxychloroquine  (PLAQUENIL ) 200 MG tablet Take 200 mg by mouth 2 (two) times daily.    [provider]  ibuprofen  (ADVIL ) 800 MG tablet Take 1 tablet (800 mg total) by mouth 3 (three) times daily. 04/17/24   Raspet, Erin K, PA-C  ipratropium (ATROVENT  HFA) 17 MCG/ACT inhaler Inhale 2 puffs into the lungs every 6 (six) hours as needed for wheezing. 05/04/23   Lola Donnice HERO, MD  ondansetron  (ZOFRAN ) 4 MG tablet Take 1 tablet (4 mg total) by mouth every 8 (eight) hours as needed for nausea or vomiting. 10/20/24   Lavell Bari A, FNP  ondansetron  (ZOFRAN -ODT) 4 MG disintegrating tablet Take 1 tablet (4 mg total) by mouth 2 (two) times daily as needed for nausea. 01/16/24   Prentiss Annabella LABOR, NP  oxyCODONE -acetaminophen  (PERCOCET) 10-325 MG tablet Take 1 tablet by mouth 3 (three) times daily as needed. 08/29/23   [provider]  pantoprazole  (PROTONIX ) 40 MG tablet Take 1 tablet (40 mg total) by mouth 2 (two) times daily before a meal. 04/11/23   Lola Donnice HERO, MD  predniSONE  (DELTASONE ) 5 MG tablet Take 5 mg by mouth daily  with breakfast.    [provider]  Prenatal 28-0.8 MG TABS Take 1 tablet by mouth daily. 02/23/23   Eveline Lynwood MATSU, MD  tizanidine  (ZANAFLEX ) 6 MG capsule Take 1 capsule (6 mg total) by mouth 2 (two) times daily as needed. 05/17/24   Rising, Rebecca, PA-C  Vitamin D, Ergocalciferol, (DRISDOL) 1.25 MG (50000 UNIT) CAPS capsule Take 50,000 Units by mouth once a week. 11/30/23   [provider]  amLODipine  (NORVASC ) 5 MG tablet Take 1 tablet (5 mg total) by mouth daily. 09/22/17 11/09/20  Rosan Deward ORN, NP    Allergies: Hydrocodone  and Zithromax  [azithromycin ]    Review of Systems  HENT:  Positive  for congestion and nosebleeds.   Respiratory:  Positive for cough.   All other systems reviewed and are negative.   Updated Vital Signs BP 122/89   Pulse 75   Temp 98.7 F (37.1 C)   Resp 16   Ht 5' 3 (1.6 m)   Wt 80.3 kg   LMP 11/17/2024 (Exact Date)   SpO2 100%   BMI 31.35 kg/m   Physical Exam Vitals and nursing note reviewed.  Constitutional:      Appearance: She is well-developed.  HENT:     Head: Normocephalic and atraumatic.     Comments: Dried blood in nares Eyes:     Extraocular Movements: Extraocular movements intact.     Pupils: Pupils are equal, round, and reactive to light.  Cardiovascular:     Rate and Rhythm: Normal rate and regular rhythm.     Heart sounds: Normal heart sounds.  Pulmonary:     Effort: Pulmonary effort is normal.     Breath sounds: Normal breath sounds.  Abdominal:     General: Bowel sounds are normal.     Palpations: Abdomen is soft.  Musculoskeletal:        General: Normal range of motion.     Cervical back: Normal range of motion and neck supple.  Skin:    General: Skin is warm and dry.     Capillary Refill: Capillary refill takes less than 2 seconds.  Neurological:     General: No focal deficit present.     Mental Status: She is alert and oriented to person, place, and time.  Psychiatric:        Mood and Affect: Mood normal.        Behavior: Behavior normal.     (all labs ordered are listed, but only abnormal results are displayed) Labs Reviewed  RESP PANEL BY RT-PCR (RSV, FLU A&B, COVID)  [id number]  CBC WITH DIFFERENTIAL/PLATELET  COMPREHENSIVE METABOLIC PANEL WITH GFR  URINALYSIS, ROUTINE W REFLEX MICROSCOPIC    EKG: EKG Interpretation Date/Time:  Friday November 29 2024 09:05:52 EST Ventricular Rate:  72 PR Interval:  152 QRS Duration:  84 QT Interval:  390 QTC Calculation: 427 R Axis:   73  Text Interpretation: Normal sinus rhythm Nonspecific T wave abnormality Abnormal ECG When compared with ECG of 06-Jun-2024  11:09, PREVIOUS ECG IS PRESENT No significant change since last tracing Confirmed by Dean Clarity [phone]) on 11/29/2024 12:46:40 PM  Radiology: [facility] Chest 2 View Result Date: 11/29/2024 CLINICAL DATA:  Chest pain EXAM: CHEST - 2 VIEW COMPARISON:  June 05, 2024 FINDINGS: The heart size and mediastinal contours are within normal limits. Stable reticular densities are noted in both upper lobes most consistent with scarring. No acute pulmonary abnormality is noted. The visualized skeletal structures are unremarkable. IMPRESSION: No active cardiopulmonary disease. Electronically  Signed   By: Lynwood Landy Raddle M.D.   On: 11/29/2024 10:47     Procedures   Medications Ordered in the ED  sodium chloride  (OCEAN) 0.65 % nasal spray 1 spray (has no administration in time range)  amoxicillin -clavulanate (AUGMENTIN ) 875-125 MG per tablet 1 tablet (1 tablet Oral Given 11/29/24 1303)                                    Medical Decision Making Amount and/or Complexity of Data Reviewed Radiology: ordered.  Risk OTC drugs. Prescription drug management.   This patient presents to the ED for concern of cough, congestion, this involves an extensive number of treatment options, and is a complaint that carries with it a high risk of complications and morbidity.  The differential diagnosis includes pna, bronchitis, covid/flu/rsv   Co morbidities that complicate the patient evaluation  lupus, interstitial lung disease, gerd, sjogren's syndrome, and depression   Additional history obtained:  Additional history obtained from epic chart review    Lab Tests:  I Ordered, and personally interpreted labs.  The pertinent results include:  cbc nl, cmp nl, ua neg, covid/flu/rsv neg   Imaging Studies ordered:  I ordered imaging studies including cxr  I independently visualized and interpreted imaging which showed No active cardiopulmonary disease.  I agree with the radiologist interpretation  Medicines  ordered and prescription drug management:  I ordered medication including augmentin /saline nasal spray  for sx  Reevaluation of the patient after these medicines showed that the patient improved I have reviewed the patients home medicines and have made adjustments as needed  Problem List / ED Course:  Bronchitis:  I am going to stop doxy and start augmentin .  She is not hypoxic or febrile or tachy.  No need for further imaging.  She is stable for d/c.  Return if worse. Epistaxis:  dried mm.  She is given saline nasal spray.   Reevaluation:  After the interventions noted above, I reevaluated the patient and found that they have :improved   Social Determinants of Health:  Lives at home   Dispostion:  After consideration of the diagnostic results and the patients response to treatment, I feel that the patent would benefit from discharge with outpatient f/u.       Final diagnoses:  Bronchitis  Epistaxis    ED Discharge Orders          Ordered    doxycycline  (VIBRAMYCIN ) 100 MG capsule  2 times daily,   Status:  Discontinued        11/29/24 1253    amoxicillin -clavulanate (AUGMENTIN ) 875-125 MG tablet  Every 12 hours        11/29/24 1255               Dean Clarity, MD 11/29/24 1309  "

## 2024-11-29 NOTE — Discharge Instructions (Addendum)
 Stop the Doxy and start the Augmentin .

## 2024-12-04 ENCOUNTER — Encounter: Payer: Self-pay | Admitting: Cardiology

## 2025-01-01 ENCOUNTER — Ambulatory Visit: Admitting: Emergency Medicine
